# Patient Record
Sex: Male | Born: 1935 | Race: White | Hispanic: No | Smoking: Former smoker
Health system: Southern US, Community
[De-identification: ages and names within clinical notes are randomized; demographics above are authoritative.]

## PROBLEM LIST (undated history)

## (undated) DIAGNOSIS — I350 Nonrheumatic aortic (valve) stenosis: Secondary | ICD-10-CM

## (undated) DIAGNOSIS — M069 Rheumatoid arthritis, unspecified: Secondary | ICD-10-CM

## (undated) DIAGNOSIS — I2699 Other pulmonary embolism without acute cor pulmonale: Secondary | ICD-10-CM

## (undated) DIAGNOSIS — K219 Gastro-esophageal reflux disease without esophagitis: Secondary | ICD-10-CM

## (undated) DIAGNOSIS — Z8619 Personal history of other infectious and parasitic diseases: Secondary | ICD-10-CM

## (undated) DIAGNOSIS — J449 Chronic obstructive pulmonary disease, unspecified: Secondary | ICD-10-CM

## (undated) DIAGNOSIS — M545 Low back pain, unspecified: Secondary | ICD-10-CM

## (undated) DIAGNOSIS — G40909 Epilepsy, unspecified, not intractable, without status epilepticus: Secondary | ICD-10-CM

## (undated) DIAGNOSIS — Z66 Do not resuscitate: Secondary | ICD-10-CM

## (undated) DIAGNOSIS — L03116 Cellulitis of left lower limb: Secondary | ICD-10-CM

## (undated) DIAGNOSIS — E782 Mixed hyperlipidemia: Secondary | ICD-10-CM

## (undated) DIAGNOSIS — K8020 Calculus of gallbladder without cholecystitis without obstruction: Secondary | ICD-10-CM

## (undated) DIAGNOSIS — I10 Essential (primary) hypertension: Secondary | ICD-10-CM

## (undated) DIAGNOSIS — I5032 Chronic diastolic (congestive) heart failure: Secondary | ICD-10-CM

## (undated) DIAGNOSIS — E538 Deficiency of other specified B group vitamins: Secondary | ICD-10-CM

## (undated) DIAGNOSIS — K626 Ulcer of anus and rectum: Secondary | ICD-10-CM

## (undated) DIAGNOSIS — I499 Cardiac arrhythmia, unspecified: Secondary | ICD-10-CM

## (undated) DIAGNOSIS — K7460 Unspecified cirrhosis of liver: Secondary | ICD-10-CM

## (undated) DIAGNOSIS — B027 Disseminated zoster: Secondary | ICD-10-CM

## (undated) DIAGNOSIS — N183 Chronic kidney disease, stage 3 unspecified: Secondary | ICD-10-CM

## (undated) DIAGNOSIS — C8330 Diffuse large B-cell lymphoma, unspecified site: Secondary | ICD-10-CM

## (undated) DIAGNOSIS — I482 Chronic atrial fibrillation, unspecified: Secondary | ICD-10-CM

## (undated) DIAGNOSIS — G8929 Other chronic pain: Secondary | ICD-10-CM

## (undated) DIAGNOSIS — D509 Iron deficiency anemia, unspecified: Secondary | ICD-10-CM

## (undated) DIAGNOSIS — R51 Headache: Secondary | ICD-10-CM

## (undated) DIAGNOSIS — R918 Other nonspecific abnormal finding of lung field: Secondary | ICD-10-CM

## (undated) DIAGNOSIS — R760 Raised antibody titer: Secondary | ICD-10-CM

## (undated) DIAGNOSIS — M47812 Spondylosis without myelopathy or radiculopathy, cervical region: Secondary | ICD-10-CM

## (undated) DIAGNOSIS — I781 Nevus, non-neoplastic: Secondary | ICD-10-CM

## (undated) DIAGNOSIS — D649 Anemia, unspecified: Secondary | ICD-10-CM

## (undated) HISTORY — DX: Chronic obstructive pulmonary disease, unspecified: J44.9

## (undated) HISTORY — PX: PORTACATH PLACEMENT: SHX2246

## (undated) HISTORY — DX: Calculus of gallbladder without cholecystitis without obstruction: K80.20

## (undated) HISTORY — DX: Deficiency of other specified B group vitamins: E53.8

## (undated) HISTORY — DX: Chronic diastolic (congestive) heart failure: I50.32

## (undated) HISTORY — DX: Do not resuscitate: Z66

## (undated) HISTORY — PX: POSTERIOR LUMBAR VERTEBRAE EXCISION: SUR545

## (undated) HISTORY — DX: Gastro-esophageal reflux disease without esophagitis: K21.9

## (undated) HISTORY — DX: Disseminated zoster: B02.7

## (undated) HISTORY — PX: LYMPH NODE BIOPSY: SHX201

## (undated) HISTORY — DX: Other nonspecific abnormal finding of lung field: R91.8

## (undated) HISTORY — PX: MYRINGOTOMY: SUR874

## (undated) HISTORY — DX: Other pulmonary embolism without acute cor pulmonale: I26.99

## (undated) HISTORY — DX: Rheumatoid arthritis, unspecified: M06.9

## (undated) HISTORY — DX: Diffuse large B-cell lymphoma, unspecified site: C83.30

## (undated) HISTORY — DX: Iron deficiency anemia, unspecified: D50.9

## (undated) HISTORY — DX: Mixed hyperlipidemia: E78.2

## (undated) HISTORY — DX: Essential (primary) hypertension: I10

## (undated) HISTORY — DX: Spondylosis without myelopathy or radiculopathy, cervical region: M47.812

## (undated) HISTORY — DX: Unspecified cirrhosis of liver: K74.60

## (undated) HISTORY — PX: BACK SURGERY: SHX140

## (undated) HISTORY — PX: CATARACT EXTRACTION W/ INTRAOCULAR LENS  IMPLANT, BILATERAL: SHX1307

## (undated) HISTORY — DX: Raised antibody titer: R76.0

## (undated) HISTORY — DX: Headache: R51

---

## 2002-02-07 ENCOUNTER — Encounter: Payer: Self-pay | Admitting: Rheumatology

## 2002-02-07 ENCOUNTER — Encounter (HOSPITAL_COMMUNITY): Admission: RE | Admit: 2002-02-07 | Discharge: 2002-03-09 | Payer: Self-pay | Admitting: Rheumatology

## 2002-03-14 ENCOUNTER — Encounter (HOSPITAL_COMMUNITY): Admission: RE | Admit: 2002-03-14 | Discharge: 2002-04-13 | Payer: Self-pay | Admitting: Rheumatology

## 2002-04-25 ENCOUNTER — Encounter (HOSPITAL_COMMUNITY): Admission: RE | Admit: 2002-04-25 | Discharge: 2002-05-25 | Payer: Self-pay | Admitting: Rheumatology

## 2002-06-27 ENCOUNTER — Encounter (HOSPITAL_COMMUNITY): Admission: RE | Admit: 2002-06-27 | Discharge: 2002-07-27 | Payer: Self-pay | Admitting: Rheumatology

## 2002-07-26 ENCOUNTER — Encounter (HOSPITAL_COMMUNITY): Admission: RE | Admit: 2002-07-26 | Discharge: 2002-08-25 | Payer: Self-pay | Admitting: Rheumatology

## 2002-08-20 ENCOUNTER — Encounter: Payer: Self-pay | Admitting: Rheumatology

## 2002-08-29 ENCOUNTER — Encounter (HOSPITAL_COMMUNITY): Admission: RE | Admit: 2002-08-29 | Discharge: 2002-09-28 | Payer: Self-pay | Admitting: Rheumatology

## 2002-09-06 ENCOUNTER — Encounter: Payer: Self-pay | Admitting: Neurosurgery

## 2002-09-10 ENCOUNTER — Encounter: Payer: Self-pay | Admitting: Neurosurgery

## 2002-09-10 ENCOUNTER — Inpatient Hospital Stay (HOSPITAL_COMMUNITY): Admission: RE | Admit: 2002-09-10 | Discharge: 2002-09-15 | Payer: Self-pay | Admitting: Neurosurgery

## 2002-09-14 ENCOUNTER — Encounter: Payer: Self-pay | Admitting: Neurosurgery

## 2002-10-08 ENCOUNTER — Inpatient Hospital Stay (HOSPITAL_COMMUNITY): Admission: AD | Admit: 2002-10-08 | Discharge: 2002-10-17 | Payer: Self-pay | Admitting: Internal Medicine

## 2002-10-09 ENCOUNTER — Encounter: Payer: Self-pay | Admitting: Cardiology

## 2002-10-12 ENCOUNTER — Encounter: Payer: Self-pay | Admitting: Neurosurgery

## 2002-12-23 ENCOUNTER — Encounter (HOSPITAL_COMMUNITY): Admission: RE | Admit: 2002-12-23 | Discharge: 2003-01-22 | Payer: Self-pay | Admitting: Rheumatology

## 2003-01-23 ENCOUNTER — Encounter: Payer: Self-pay | Admitting: Rheumatology

## 2003-01-23 ENCOUNTER — Ambulatory Visit (HOSPITAL_COMMUNITY): Admission: RE | Admit: 2003-01-23 | Discharge: 2003-01-23 | Payer: Self-pay | Admitting: Rheumatology

## 2003-03-06 ENCOUNTER — Encounter (HOSPITAL_COMMUNITY): Admission: RE | Admit: 2003-03-06 | Discharge: 2003-04-05 | Payer: Self-pay | Admitting: Rheumatology

## 2003-05-29 ENCOUNTER — Encounter (HOSPITAL_COMMUNITY): Admission: RE | Admit: 2003-05-29 | Discharge: 2003-06-28 | Payer: Self-pay | Admitting: Rheumatology

## 2003-08-01 ENCOUNTER — Encounter (HOSPITAL_COMMUNITY): Admission: RE | Admit: 2003-08-01 | Discharge: 2003-08-31 | Payer: Self-pay | Admitting: Rheumatology

## 2003-10-03 ENCOUNTER — Ambulatory Visit (HOSPITAL_COMMUNITY): Admission: RE | Admit: 2003-10-03 | Discharge: 2003-10-03 | Payer: Self-pay | Admitting: Neurosurgery

## 2003-10-03 ENCOUNTER — Encounter: Payer: Self-pay | Admitting: Neurosurgery

## 2003-10-21 ENCOUNTER — Encounter: Admission: RE | Admit: 2003-10-21 | Discharge: 2003-10-21 | Payer: Self-pay | Admitting: Neurosurgery

## 2003-11-05 ENCOUNTER — Encounter: Admission: RE | Admit: 2003-11-05 | Discharge: 2003-11-05 | Payer: Self-pay | Admitting: Neurosurgery

## 2004-03-24 ENCOUNTER — Encounter: Admission: RE | Admit: 2004-03-24 | Discharge: 2004-03-24 | Payer: Self-pay | Admitting: Neurosurgery

## 2004-05-05 ENCOUNTER — Encounter: Admission: RE | Admit: 2004-05-05 | Discharge: 2004-05-05 | Payer: Self-pay | Admitting: Neurosurgery

## 2004-08-03 ENCOUNTER — Encounter: Admission: RE | Admit: 2004-08-03 | Discharge: 2004-08-03 | Payer: Self-pay | Admitting: Neurosurgery

## 2007-08-09 ENCOUNTER — Encounter: Admission: RE | Admit: 2007-08-09 | Discharge: 2007-08-09 | Payer: Self-pay | Admitting: Neurosurgery

## 2008-09-10 ENCOUNTER — Inpatient Hospital Stay (HOSPITAL_COMMUNITY): Admission: RE | Admit: 2008-09-10 | Discharge: 2008-09-13 | Payer: Self-pay | Admitting: Neurosurgery

## 2008-12-26 DIAGNOSIS — B027 Disseminated zoster: Secondary | ICD-10-CM

## 2008-12-26 HISTORY — DX: Disseminated zoster: B02.7

## 2009-11-18 ENCOUNTER — Encounter: Admission: RE | Admit: 2009-11-18 | Discharge: 2009-11-18 | Payer: Self-pay | Admitting: Neurosurgery

## 2010-03-04 ENCOUNTER — Encounter: Admission: RE | Admit: 2010-03-04 | Discharge: 2010-03-04 | Payer: Self-pay | Admitting: Neurosurgery

## 2010-03-29 ENCOUNTER — Encounter: Admission: RE | Admit: 2010-03-29 | Discharge: 2010-03-29 | Payer: Self-pay | Admitting: Neurosurgery

## 2010-05-11 ENCOUNTER — Encounter: Admission: RE | Admit: 2010-05-11 | Discharge: 2010-05-11 | Payer: Self-pay | Admitting: Neurosurgery

## 2010-05-25 ENCOUNTER — Inpatient Hospital Stay (HOSPITAL_COMMUNITY): Admission: RE | Admit: 2010-05-25 | Discharge: 2010-05-27 | Payer: Self-pay | Admitting: Neurosurgery

## 2010-08-23 ENCOUNTER — Emergency Department (HOSPITAL_COMMUNITY): Admission: EM | Admit: 2010-08-23 | Discharge: 2010-08-23 | Payer: Self-pay | Admitting: Emergency Medicine

## 2010-09-02 ENCOUNTER — Ambulatory Visit: Payer: Self-pay | Admitting: Cardiology

## 2010-09-02 ENCOUNTER — Inpatient Hospital Stay (HOSPITAL_COMMUNITY): Admission: EM | Admit: 2010-09-02 | Discharge: 2010-09-07 | Payer: Self-pay | Admitting: Emergency Medicine

## 2010-09-03 ENCOUNTER — Encounter (INDEPENDENT_AMBULATORY_CARE_PROVIDER_SITE_OTHER): Payer: Self-pay | Admitting: Internal Medicine

## 2010-09-03 ENCOUNTER — Encounter: Payer: Self-pay | Admitting: Cardiology

## 2010-09-07 ENCOUNTER — Encounter: Payer: Self-pay | Admitting: Cardiology

## 2010-09-09 ENCOUNTER — Ambulatory Visit: Payer: Self-pay | Admitting: Cardiology

## 2010-09-09 LAB — CONVERTED CEMR LAB: POC INR: 3.5

## 2010-09-14 ENCOUNTER — Ambulatory Visit: Payer: Self-pay | Admitting: Cardiology

## 2010-09-14 LAB — CONVERTED CEMR LAB: POC INR: 1.9

## 2010-09-15 ENCOUNTER — Encounter: Payer: Self-pay | Admitting: Cardiology

## 2010-09-16 ENCOUNTER — Ambulatory Visit: Payer: Self-pay | Admitting: Otolaryngology

## 2010-09-21 ENCOUNTER — Ambulatory Visit: Payer: Self-pay | Admitting: Cardiology

## 2010-09-21 LAB — CONVERTED CEMR LAB: POC INR: 3.8

## 2010-10-01 ENCOUNTER — Ambulatory Visit: Payer: Self-pay | Admitting: Cardiology

## 2010-10-01 LAB — CONVERTED CEMR LAB: POC INR: 3.8

## 2010-10-14 ENCOUNTER — Ambulatory Visit: Payer: Self-pay | Admitting: Otolaryngology

## 2010-10-19 ENCOUNTER — Ambulatory Visit: Payer: Self-pay | Admitting: Cardiology

## 2010-10-19 LAB — CONVERTED CEMR LAB: POC INR: 2.9

## 2010-11-02 ENCOUNTER — Ambulatory Visit: Payer: Self-pay | Admitting: Cardiology

## 2010-11-02 DIAGNOSIS — M069 Rheumatoid arthritis, unspecified: Secondary | ICD-10-CM

## 2010-11-02 DIAGNOSIS — I482 Chronic atrial fibrillation, unspecified: Secondary | ICD-10-CM

## 2010-11-02 DIAGNOSIS — I10 Essential (primary) hypertension: Secondary | ICD-10-CM

## 2010-11-02 DIAGNOSIS — I2699 Other pulmonary embolism without acute cor pulmonale: Secondary | ICD-10-CM

## 2010-11-02 DIAGNOSIS — J4489 Other specified chronic obstructive pulmonary disease: Secondary | ICD-10-CM | POA: Insufficient documentation

## 2010-11-02 DIAGNOSIS — J984 Other disorders of lung: Secondary | ICD-10-CM | POA: Insufficient documentation

## 2010-11-02 DIAGNOSIS — J449 Chronic obstructive pulmonary disease, unspecified: Secondary | ICD-10-CM

## 2010-11-09 ENCOUNTER — Ambulatory Visit: Payer: Self-pay | Admitting: Cardiology

## 2010-11-09 ENCOUNTER — Telehealth: Payer: Self-pay | Admitting: Cardiology

## 2010-11-30 ENCOUNTER — Ambulatory Visit: Payer: Self-pay | Admitting: Cardiology

## 2010-12-28 ENCOUNTER — Ambulatory Visit: Admission: RE | Admit: 2010-12-28 | Discharge: 2010-12-28 | Payer: Self-pay | Source: Home / Self Care

## 2010-12-28 LAB — CONVERTED CEMR LAB: POC INR: 2

## 2011-01-11 ENCOUNTER — Encounter: Payer: Self-pay | Admitting: *Deleted

## 2011-01-25 ENCOUNTER — Ambulatory Visit: Admit: 2011-01-25 | Payer: Self-pay

## 2011-01-25 NOTE — Letter (Signed)
Summary: Discharge Summary  Discharge Summary   Imported By: Dorise Hiss 11/02/2010 12:17:24  _____________________________________________________________________  External Attachment:    Type:   Image     Comment:   External Document

## 2011-01-25 NOTE — Assessment & Plan Note (Signed)
Summary: eph-post [place] [facility] in sept   Visit Type:  Follow-up Referring Provider:  Dr. Stacey Drain Primary Provider:  Dr. Joette Catching   History of Present Illness: 77 year old male presents for posthospital followup. He was seen in consultation at [facility] back in September with newly diagnosed atrial fibrillation. He had presented with shortness of breath and pleuritic chest pain, ultimately with findings of bilateral pulmonary emboli by chest CT angiography. A left lung nodule was also noted. Echocardiography demonstrated an LVEF of 65-70% with grade 1 diastolic dysfunction, and mild tricuspid regurgitation. Cardiac markers were normal. He was placed on Coumadin and stabilized from the perspective of pulmonary emboli. He was noted to be lupus anticoagulant positive. Atrial fibrillation spontaneously converted to sinus rhythm with rate control. He was treated with oral Cardizem. He did have transient thrombocytopenia, although this improved, even after heparin and Coumadin.  He has been followed in Coumadin clinic. Reports no major bleeding problems, occasional nosebleed, but wears oxygen chronically via nasal cannula. He has had no major palpitations since discharge. ECG shows normal sinus rhythm today. As noted previously his CHADS2 score is 2.  I expect he will be maintained on Coumadin chronically at this point in light of recurrent pulmonary emboli, significant thrombus burden, lupus anticoagulant positivity, and also associated atrial fibrillation which may well be paroxysmal.  Preventive Screening-Counseling & Management  Alcohol-Tobacco     Smoking Status: quit     Year Quit: 1999      Drug Use:  no.    Current Medications (verified): 1)  Cardizem Cd 120 Mg Xr24h-Cap (Diltiazem Hcl Coated Beads) .... Take 1 Tablet By Mouth Once A Day 2)  Klor-Con M20 20 Meq Cr-Tabs (Potassium Chloride Crys Cr) .... Take 1 Tablet By Mouth Two Times A Day 3)  Omeprazole 20 Mg Cpdr  (Omeprazole) .... Take 1 Tablet By Mouth Two Times A Day 4)  Clonidine Hcl 0.1 Mg Tabs (Clonidine Hcl) .... Take 1 Tablet By Mouth Two Times A Day 5)  Folic Acid 1 Mg Tabs (Folic Acid) .... Take 1 Tablet By Mouth Once A Day 6)  Gabapentin 300 Mg Caps (Gabapentin) .... Take 1 Tablet By Mouth Three Times A Day 7)  Oxycodone-Acetaminophen 5-325 Mg Tabs (Oxycodone-Acetaminophen) .... Take 1 Tablet By Mouth Three Times A Day 8)  Proair Hfa 108 (90 Base) Mcg/act Aers (Albuterol Sulfate) .... 2 Puffs Four Times A Day As Needed 9)  Albuterol Sulfate (2.5 Mg/25ml) 0.083% Nebu (Albuterol Sulfate) .... One Neb Tx Four Times A Day 10)  Fish Oil 1000 Mg Caps (Omega-3 Fatty Acids) .... Take 1 Tablet By Mouth Two Times A Day 11)  Calcium Carbonate 600 Mg Tabs (Calcium Carbonate) .... Take 1 Tablet By Mouth Two Times A Day 12)  Warfarin Sodium 2 Mg Tabs (Warfarin Sodium) .... Use As Directed 13)  Oxygen 2 L/[place] .... Use As Directed Continuous 14)  Prednisone 10 Mg Tabs (Prednisone) .... Use As Directed 15)  Furosemide 40 Mg Tabs (Furosemide) .... Take 1 Tablet By Mouth Once A Day 16)  Zolpidem Tartrate 10 Mg Tabs (Zolpidem Tartrate) .... Take 1 Tablet By Mouth Once A Day 17)  Hydrocodone-Acetaminophen 5-500 Mg Tabs (Hydrocodone-Acetaminophen) .... Take 1 Tablet By Mouth Two Times A Day As Needed  Allergies (verified): 1)  ! Novocain  Comments:  Nurse/Medical Assistant: The patient's medication bottles and allergies were reviewed with the patient and were updated in the Medication and Allergy Lists.  Past History:  Family History: Last updated:  11/02/2010 Noncontributory for premature cardiovascular disease  Social History: Last updated: 11/02/2010 Married  Tobacco Use - Former (quit 1999) Alcohol Use - no (former, quit 1967) Drug Use - no  Past Medical History: Rheumatoid arthritis COPD Disseminated Herpes zoster GERD Asthma Hypertension Pulmonary embolus 2003 and 2011 Pulmonary nodules -  recent chest CT 9/11 Hyperlipidemia Left-sided otitis externa Atrial Fibrillation - CHADS2 score 2  Past Surgical History: Cataract Extraction Lumbar Fusion Bilateral L2 laminectomy  Family History: Noncontributory for premature cardiovascular disease  Social History: Married  Tobacco Use - Former (quit 1999) Alcohol Use - no (former, quit 1967) Drug Use - no Smoking Status:  quit Drug Use:  no  Review of Systems       The patient complains of dyspnea on exertion.  The patient denies anorexia, fever, chest pain, syncope, peripheral edema, hemoptysis, melena, and hematochezia.         Otherwise reviewed and negative except as outlined.  Vital Signs:  Patient profile:   77 year old male Height:      72 inches Weight:      207 pounds BMI:     28.18 O2 Sat:      95 % on 2 L/min [place] Pulse rate:   73 / minute BP sitting:   120 / 71  (left arm) Cuff size:   large  Vitals Entered By: Carlye Grippe (November 02, 2010 2:45 PM)  Nutrition Counseling: Patient's BMI is greater than 25 and therefore counseled on weight management options.  O2 Flow:  2 L/min [place]  Physical Exam  Additional Exam:  Overweight, bearded male, no acute distress, wearing oxygen via nasal cannula. HEENT: Conjunctiva and lids normal, oropharynx with poor dentition. Neck: Supple, no elevated JVP or carotid bruits. Lungs: Diminished, coarse breath sounds throughout, slight end expiratory wheeze. Cardiac: Regular rate and rhythm, no S3. Abdomen: Soft, nontender, bowel sounds present. Extremities: No pitting edema. Skin: Diffuse epidermal scarring status post disseminated zoster. Musculoskeletal: No kyphosis. Neuropsychiatric: Alert and oriented x3, affect appropriate.   Echocardiogram  Procedure date:  09/03/2010  Findings:       Study Conclusions    - Procedure narrative: Transthoracic echocardiography. Image quality     was poor.   - Left ventricle: The cavity size was mildly reduced. Wall  thickness     was increased in a pattern of mild LVH. Systolic function was     vigorous. The estimated ejection fraction was in the range of 65%     to 70%. Doppler parameters are consistent with abnormal left     ventricular relaxation (grade 1 diastolic dysfunction).   - Aortic valve: Poorly visualized.   - Mitral valve: Poorly visualized. Calcified annulus.   - Left atrium: The atrium was mildly dilated.   - Tricuspid valve: Mild regurgitation.   - Pericardium, extracardiac: A trivial pericardial effusion was     identified.  CT Scan  Procedure date:  09/03/2010  Findings:       IMPRESSION:   Bilateral pulmonary emboli, including large saddle embolus at the   right pulmonary bifurcation as well as a large left lower lobe   pulmonary embolus.   Bibasilar atelectasis.   2 left lung nodules, 10 and 6 mm in greatest sizes, the largest of   which corresponds in size and position with a nodule described on a   chest CT of 10/08/2002, though those images are no longer available   for comparison.   Recommend follow-up CT in 3-4 months  to reassess.  EKG  Procedure date:  11/02/2010  Findings:      Sinus rhythm at 77 beats per minute with leftward axis.  Impression & Recommendations:  Problem # 1:  ATRIAL FIBRILLATION (ICD-427.31)  Maintains sinus rhythm. It could be that he has had paroxysmal atrial fibrillation that was never formally diagnosed, although perhaps also potentially related to his pulmonary emboli. CHADS2 score is 2. Plan to continue Coumadin and Cardizem CD at this point. Followup in 4 months. Continue regular followup in the Coumadin clinic.  His updated medication list for this problem includes:    Warfarin Sodium 2 Mg Tabs (Warfarin sodium) ..... Use as directed  Orders: EKG w/ Interpretation (93000)  Problem # 2:  PULMONARY EMBOLISM (ICD-415.19)  Bilateral pulmonary emboli diagnosed in September, with prior episode of pulmonary emboli in 2003. Patient  noted to have lupus anticoagulant positivity. I would anticipate he will stay on Coumadin long term.  His updated medication list for this problem includes:    Warfarin Sodium 2 Mg Tabs (Warfarin sodium) ..... Use as directed  Problem # 3:  PULMONARY NODULE (ICD-518.89)  Noted on chest CT angiogram from September. Reportedly not a new finding. Study report indicates consideration for followup imaging in 3-4 months. Will defer to patient's primary care physician, Dr. Lysbeth Galas. Referral for formal pulmonary consultation and followup could be considered, particularly in light of his known COPD that is oxygen dependent, and also recent diagnosis of pulmonary emboli.  Problem # 4:  HYPERTENSION (ICD-401.9)  Blood pressure well controlled today.  His updated medication list for this problem includes:    Cardizem Cd 120 Mg Xr24h-cap (Diltiazem hcl coated beads) .Marland Kitchen... Take 1 tablet by mouth once a day    Clonidine Hcl 0.1 Mg Tabs (Clonidine hcl) .Marland Kitchen... Take 1 tablet by mouth two times a day    Furosemide 40 Mg Tabs (Furosemide) .Marland Kitchen... Take 1 tablet by mouth once a day  Patient Instructions: 1)  Your physician wants you to follow-up in: 4 months. You will receive a reminder letter in the mail one-two months in advance. If you don't receive a letter, please call our office to schedule the follow-up appointment. 2)  Your physician recommends that you continue on your current medications as directed. Please refer to the Current Medication list given to you today. Prescriptions: CARDIZEM CD 120 MG XR24H-CAP (DILTIAZEM HCL COATED BEADS) Take 1 tablet by mouth once a day  #30 x 6   Entered by:   Cyril Loosen, RN, BSN   Authorized by:   Loreli Slot, MD, [facility]   Signed by:   Cyril Loosen, RN, BSN on 11/02/2010   Method used:   [facility] to        [facility]  [address] [address] [id number]retail[facility]       [address]       [place], [place]  [address]       Ph: [phone] or  [phone]       Fax: [phone]   RxID:   [id number]

## 2011-01-25 NOTE — Medication Information (Signed)
Summary: ccr-lr  Anticoagulant Therapy  Managed by: Vashti Hey RN Supervising MD: Diona Browner MD, Remi Deter Indication 1: Pulmonary Embolism (Bilateral) Lab Used: [facility] [place] Site: [place] INR POC 2.9  Dietary changes: no    Health status changes: no    Bleeding/hemorrhagic complications: no    Recent/future hospitalizations: no    Any changes in medication regimen? no    Recent/future dental: no  Any missed doses?: no       Is patient compliant with meds? yes       Anticoagulation Management History:      The patient is taking warfarin and comes in today for a routine follow up visit.  Positive risk factors for bleeding include an age of 75 years or older.  The bleeding index is 'intermediate risk'.  Positive CHADS2 values include Age > 55 years old.  Anticoagulation responsible Jakai Onofre: Diona Browner MD, Remi Deter.  INR POC: 2.9.  Cuvette Lot#: [id number].    Anticoagulation Management Assessment/Plan:      The target INR is 2.0-3.0.  The next INR is due 11/09/2010.  Anticoagulation instructions were given to patient.  Results were reviewed/authorized by Vashti Hey RN.  He was notified by Vashti Hey RN.         Prior Anticoagulation Instructions: INR 3.8 Hold coumadin tonight then decrease dose to 4mg  once daily except 6mg  on Mondays and Thursdays  Current Anticoagulation Instructions: INR 2.9 Continue coumadin 4mg  once daily except 6mg  on Mondays and Thursdays

## 2011-01-25 NOTE — Medication Information (Signed)
Summary: ccr-lr  Anticoagulant Therapy  Managed by: Vashti Hey RN PCP: Dr. Joette Catching Supervising MD: Antoine Poche MD, Fayrene Fearing Indication 1: Pulmonary Embolism (Bilateral) Lab Used: [facility] [place] Site: [place] INR POC 3.0  Dietary changes: no    Health status changes: no    Bleeding/hemorrhagic complications: no    Recent/future hospitalizations: no    Any changes in medication regimen? no    Recent/future dental: no  Any missed doses?: no       Is patient compliant with meds? yes       Allergies: 1)  ! Novocain  Anticoagulation Management History:      The patient is taking warfarin and comes in today for a routine follow up visit.  Positive risk factors for bleeding include an age of 75 years or older.  The bleeding index is 'intermediate risk'.  Positive CHADS2 values include History of HTN and Age > 34 years old.  Anticoagulation responsible Hamna Asa: Antoine Poche MD, Fayrene Fearing.  INR POC: 3.0.  Cuvette Lot#: [id number].    Anticoagulation Management Assessment/Plan:      The patient's current anticoagulation dose is Warfarin sodium 2 mg tabs: use as directed.  The target INR is 2.0-3.0.  The next INR is due 12/28/2010.  Anticoagulation instructions were given to patient.  Results were reviewed/authorized by Vashti Hey RN.  He was notified by Vashti Hey RN.         Prior Anticoagulation Instructions: INR 3.6 Take coumadin 1 tablet tonight then decrease dose to 2 tablets once daily except 3 tablets on Fridays  Current Anticoagulation Instructions: INR 3.0 Continue coumadin 4mg  once daily except 6mg  on Fridays Prescriptions: WARFARIN SODIUM 2 MG TABS (WARFARIN SODIUM) use as directed  #90 x 3   Entered by:   Vashti Hey RN   Authorized by:   Loreli Slot, MD, [facility]   Signed by:   Vashti Hey RN on 11/30/2010   Method used:   [facility]  [address] [id number]       [address]       [place], [place]   [address]       Ph: [phone] or [phone]       Fax: [phone]   RxID:   [id number]

## 2011-01-25 NOTE — Medication Information (Signed)
Summary: ccr-lr  Anticoagulant Therapy  Managed by: Vashti Hey RN Supervising MD: Andee Lineman MD, Michelle Piper Indication 1: Pulmonary Embolism (Bilateral) Lab Used: [facility] [place] Site: [place] INR POC 3.8  Dietary changes: no    Health status changes: no    Bleeding/hemorrhagic complications: no    Recent/future hospitalizations: no    Any changes in medication regimen? no    Recent/future dental: no  Any missed doses?: no       Is patient compliant with meds? yes       Anticoagulation Management History:      The patient is taking warfarin and comes in today for a routine follow up visit.  Positive risk factors for bleeding include an age of 76 years or older.  The bleeding index is 'intermediate risk'.  Positive CHADS2 values include Age > 18 years old.  Anticoagulation responsible provider: Andee Lineman MD, Michelle Piper.  INR POC: 3.8.  Cuvette Lot#: [id number].    Anticoagulation Management Assessment/Plan:      The target INR is 2.0-3.0.  The next INR is due 10/01/2010.  Anticoagulation instructions were given to patient.  Results were reviewed/authorized by Vashti Hey RN.  He was notified by Vashti Hey RN.         Prior Anticoagulation Instructions: INR 1.9 Increase coumadin to 6mg  once daily except 4mg  on Mondays  Current Anticoagulation Instructions: INR 3.8 Hold coumadin tonight then decrease dose to 6mg  once daily except 4mg  on Tuesdays and Fridays

## 2011-01-25 NOTE — Medication Information (Signed)
Summary: ccr-lr  Anticoagulant Therapy  Managed by: Vashti Hey RN Supervising MD: Andee Lineman MD, Michelle Piper Indication 1: Pulmonary Embolism (Bilateral) Lab Used: [facility] [place] Site: [place] INR POC 3.8  Dietary changes: no    Health status changes: no    Bleeding/hemorrhagic complications: no    Recent/future hospitalizations: no    Any changes in medication regimen? no    Recent/future dental: no  Any missed doses?: no       Is patient compliant with meds? yes       Anticoagulation Management History:      The patient is taking warfarin and comes in today for a routine follow up visit.  Positive risk factors for bleeding include an age of 75 years or older.  The bleeding index is 'intermediate risk'.  Positive CHADS2 values include Age > 13 years old.  Anticoagulation responsible provider: Andee Lineman MD, Michelle Piper.  INR POC: 3.8.  Cuvette Lot#: [id number].    Anticoagulation Management Assessment/Plan:      The target INR is 2.0-3.0.  The next INR is due 10/19/2010.  Anticoagulation instructions were given to patient.  Results were reviewed/authorized by Vashti Hey RN.  He was notified by Vashti Hey RN.         Prior Anticoagulation Instructions: INR 3.8 Hold coumadin tonight then decrease dose to 6mg  once daily except 4mg  on Tuesdays and Fridays  Current Anticoagulation Instructions: INR 3.8 Hold coumadin tonight then decrease dose to 4mg  once daily except 6mg  on Mondays and Thursdays

## 2011-01-25 NOTE — Consult Note (Signed)
Summary: Consultation Report/ ATRIAL FIBRILLATION  Consultation Report/ ATRIAL FIBRILLATION   Imported By: Dorise Hiss 11/02/2010 12:16:08  _____________________________________________________________________  External Attachment:    Type:   Image     Comment:   External Document

## 2011-01-25 NOTE — Medication Information (Signed)
Summary: POST HOSP PROTIME PER DR.FISHER/TG  Anticoagulant Therapy  Managed by: Vashti Hey RN Supervising MD: Dietrich Pates MD, Molly Maduro Indication 1: Pulmonary Embolism (Bilateral) Lab Used: [facility] [place] Site: [place] INR POC 3.5  Dietary changes: no    Health status changes: no    Bleeding/hemorrhagic complications: no    Recent/future hospitalizations: yes       Details: In [facility] 09/02/10 - 09/07/10  bilateral PE  Any changes in medication regimen? yes       Details: discharged on coumadin 6mg  qd   Has 2mg  tablet  Recent/future dental: no  Any missed doses?: no       Is patient compliant with meds? yes      Comments: Coumadin teaching performed with pt and family menmber.  Anticoagulation Management History:      The patient comes in today for his initial visit for anticoagulation therapy.  Positive risk factors for bleeding include an age of 75 years or older.  The bleeding index is 'intermediate risk'.  Positive CHADS2 values include Age > 21 years old.  Anticoagulation responsible Andres Vest: Dietrich Pates MD, Molly Maduro.  INR POC: 3.5.  Cuvette Lot#: [id number].    Anticoagulation Management Assessment/Plan:      The target INR is 2.0-3.0.  The next INR is due 09/14/2010.  Anticoagulation instructions were given to patient.  Results were reviewed/authorized by Vashti Hey RN.  He was notified by Vashti Hey RN.        Coagulation management information includes: D/C 9/13 on 6mg  once daily .  Current Anticoagulation Instructions: INR 3.5 Hold coumadin tonight then decrease dose to 3 tablets once daily except 2 tablets on Saturdays and Mondays

## 2011-01-25 NOTE — Medication Information (Signed)
Summary: ccr-lr  Anticoagulant Therapy  Managed by: Vashti Hey RN Supervising MD: Antoine Poche MD, Fayrene Fearing Indication 1: Pulmonary Embolism (Bilateral) Lab Used: [facility] [place] Site: [place] INR POC 1.9  Dietary changes: no    Health status changes: no    Bleeding/hemorrhagic complications: no    Recent/future hospitalizations: no    Any changes in medication regimen? no    Recent/future dental: no  Any missed doses?: no       Is patient compliant with meds? yes       Anticoagulation Management History:      The patient is taking warfarin and comes in today for a routine follow up visit.  Positive risk factors for bleeding include an age of 75 years or older.  The bleeding index is 'intermediate risk'.  Positive CHADS2 values include Age > 75 years old.  Anticoagulation responsible provider: Antoine Poche MD, Fayrene Fearing.  INR POC: 1.9.  Cuvette Lot#: [id number].    Anticoagulation Management Assessment/Plan:      The target INR is 2.0-3.0.  The next INR is due 09/21/2010.  Anticoagulation instructions were given to patient.  Results were reviewed/authorized by Vashti Hey RN.  He was notified by Vashti Hey RN.         Prior Anticoagulation Instructions: INR 3.5 Hold coumadin tonight then decrease dose to 3 tablets once daily except 2 tablets on Saturdays and Mondays   Current Anticoagulation Instructions: INR 1.9 Increase coumadin to 6mg  once daily except 4mg  on Mondays

## 2011-01-25 NOTE — Medication Information (Signed)
Summary: ccr-lr  Anticoagulant Therapy  Managed by: Vashti Hey RN PCP: Dr. Joette Catching Supervising MD: Andee Lineman MD, Michelle Piper Indication 1: Pulmonary Embolism (Bilateral) Lab Used: [facility] [place] Site: [place] INR POC 3.6  Dietary changes: no    Health status changes: no    Bleeding/hemorrhagic complications: no    Recent/future hospitalizations: no    Any changes in medication regimen? no    Recent/future dental: no  Any missed doses?: no       Is patient compliant with meds? yes       Allergies: 1)  ! Novocain  Anticoagulation Management History:      The patient is taking warfarin and comes in today for a routine follow up visit.  Positive risk factors for bleeding include an age of 75 years or older.  The bleeding index is 'intermediate risk'.  Positive CHADS2 values include History of HTN and Age > 33 years old.  Anticoagulation responsible provider: Andee Lineman MD, Michelle Piper.  INR POC: 3.6.  Cuvette Lot#: [id number].    Anticoagulation Management Assessment/Plan:      The patient's current anticoagulation dose is Warfarin sodium 2 mg tabs: use as directed.  The target INR is 2.0-3.0.  The next INR is due 11/09/2010.  Anticoagulation instructions were given to patient.  Results were reviewed/authorized by Vashti Hey RN.  He was notified by Vashti Hey RN.         Prior Anticoagulation Instructions: INR 2.9 Continue coumadin 4mg  once daily except 6mg  on Mondays and Thursdays  Current Anticoagulation Instructions: INR 3.6 Take coumadin 1 tablet tonight then decrease dose to 2 tablets once daily except 3 tablets on Fridays

## 2011-01-27 NOTE — Medication Information (Signed)
Summary: Coumadin Clinic  Anticoagulant Therapy  Managed by: Inactive PCP: Dr. Joette Catching Supervising MD: Diona Browner MD, Remi Deter Indication 1: Pulmonary Embolism (Bilateral) Lab Used: [facility] [place] Site: [place]          Comments: Pt is having coumadin managed at [facility] in [place] now  Allergies: 1)  ! Novocain  Anticoagulation Management History:      Positive risk factors for bleeding include an age of 75 years or older.  The bleeding index is 'intermediate risk'.  Positive CHADS2 values include History of HTN and Age > 75 years old.  Anticoagulation responsible provider: Diona Browner MD, Remi Deter.    Anticoagulation Management Assessment/Plan:      The patient's current anticoagulation dose is Warfarin sodium 2 mg tabs: use as directed.  The target INR is 2.0-3.0.  The next INR is due 01/25/2011.  Anticoagulation instructions were given to patient.  Results were reviewed/authorized by Inactive.         Prior Anticoagulation Instructions: INR 2.0 Continue coumadin 4mg  once daily except 6mg  on Fridays

## 2011-01-27 NOTE — Medication Information (Signed)
Summary: ccr-lr  Anticoagulant Therapy  Managed by: Vashti Hey RN PCP: Dr. Joette Catching Supervising MD: Diona Browner MD, Remi Deter Indication 1: Pulmonary Embolism (Bilateral) Lab Used: [facility] [place] Site: [place] INR POC 2.0  Dietary changes: no    Health status changes: no    Bleeding/hemorrhagic complications: no    Recent/future hospitalizations: no    Any changes in medication regimen? no    Recent/future dental: no  Any missed doses?: no       Is patient compliant with meds? yes       Allergies: 1)  ! Novocain  Anticoagulation Management History:      The patient is taking warfarin and comes in today for a routine follow up visit.  Positive risk factors for bleeding include an age of 44 years or older.  The bleeding index is 'intermediate risk'.  Positive CHADS2 values include History of HTN and Age > 65 years old.  Anticoagulation responsible provider: Diona Browner MD, Remi Deter.  INR POC: 2.0.  Cuvette Lot#: [id number].    Anticoagulation Management Assessment/Plan:      The patient's current anticoagulation dose is Warfarin sodium 2 mg tabs: use as directed.  The target INR is 2.0-3.0.  The next INR is due 01/25/2011.  Anticoagulation instructions were given to patient.  Results were reviewed/authorized by Vashti Hey RN.  He was notified by Vashti Hey RN.         Prior Anticoagulation Instructions: INR 3.0 Continue coumadin 4mg  once daily except 6mg  on Fridays  Current Anticoagulation Instructions: INR 2.0 Continue coumadin 4mg  once daily except 6mg  on Fridays

## 2011-01-27 NOTE — Progress Notes (Signed)
Summary: needs to come off coumadin  Phone Note Outgoing Call   Call placed by: Vashti Hey RN,  November 09, 2010 3:20 PM Summary of Call: Dr Jeral Fruit wants to schedule pt for lumbar/cervical myelogram.  Is it ok for pt to come off couamdin 5 days before? Initial call taken by: Vashti Hey RN,  November 09, 2010 3:22 PM  Follow-up for Phone Call        He was just diagnosed with a recurrent pulmonary embolus with lupus anticoagulant positivity in September and has atrial fibrillation as well.  He really should not come off Coumadin this soon after diagnosis.  I expect he will require bridging with either Lovenox or Heparin for procedures in the future. Follow-up by: Loreli Slot, MD, [facility],  November 09, 2010 3:55 PM

## 2011-02-28 ENCOUNTER — Ambulatory Visit (HOSPITAL_COMMUNITY)
Admission: RE | Admit: 2011-02-28 | Discharge: 2011-02-28 | Disposition: A | Discharge status: Home / Self Care | Payer: Medicare PPO | Source: Ambulatory Visit | Attending: Ophthalmology | Admitting: Ophthalmology

## 2011-02-28 DIAGNOSIS — I10 Essential (primary) hypertension: Secondary | ICD-10-CM | POA: Insufficient documentation

## 2011-02-28 DIAGNOSIS — H26499 Other secondary cataract, unspecified eye: Secondary | ICD-10-CM | POA: Insufficient documentation

## 2011-03-10 LAB — PROTIME-INR
Prothrombin Time: 14.8 seconds (ref 11.6–15.2)
Prothrombin Time: 16.2 seconds — ABNORMAL HIGH (ref 11.6–15.2)
Prothrombin Time: 22.2 seconds — ABNORMAL HIGH (ref 11.6–15.2)
Prothrombin Time: 23.6 seconds — ABNORMAL HIGH (ref 11.6–15.2)
Prothrombin Time: 24.3 seconds — ABNORMAL HIGH (ref 11.6–15.2)
Prothrombin Time: 28.1 seconds — ABNORMAL HIGH (ref 11.6–15.2)

## 2011-03-10 LAB — BASIC METABOLIC PANEL
BUN: 16 mg/dL (ref 6–23)
BUN: 8 mg/dL (ref 6–23)
Calcium: 8.5 mg/dL (ref 8.4–10.5)
Calcium: 8.9 mg/dL (ref 8.4–10.5)
Chloride: 104 mEq/L (ref 96–112)
Creatinine, Ser: 0.93 mg/dL (ref 0.4–1.5)
Creatinine, Ser: 1.12 mg/dL (ref 0.4–1.5)
GFR calc Af Amer: >60 mL/min (ref >60)
GFR calc non Af Amer: 59 mL/min — ABNORMAL LOW (ref >60)
GFR calc non Af Amer: >60 mL/min (ref >60)
GFR calc non Af Amer: >60 mL/min (ref >60)
Glucose, Bld: 96 mg/dL (ref 70–99)
Potassium: 4.6 mEq/L (ref 3.5–5.1)
Sodium: 135 mEq/L (ref 135–145)

## 2011-03-10 LAB — DIFFERENTIAL
Basophils Absolute: 0 10*3/uL (ref 0.0–0.1)
Basophils Absolute: 0 10*3/uL (ref 0.0–0.1)
Basophils Absolute: 0 10*3/uL (ref 0.0–0.1)
Basophils Absolute: 0 10*3/uL (ref 0.0–0.1)
Basophils Absolute: 0 10*3/uL (ref 0.0–0.1)
Basophils Relative: 0 % (ref 0–1)
Basophils Relative: 0 % (ref 0–1)
Basophils Relative: 0 % (ref 0–1)
Basophils Relative: 0 % (ref 0–1)
Eosinophils Absolute: 0.2 10*3/uL (ref 0.0–0.7)
Eosinophils Absolute: 0.4 10*3/uL (ref 0.0–0.7)
Eosinophils Absolute: 0.5 10*3/uL (ref 0.0–0.7)
Eosinophils Absolute: 0.3 10*3/uL (ref 0.0–0.7)
Eosinophils Relative: 4 % (ref 0–5)
Eosinophils Relative: 7 % — ABNORMAL HIGH (ref 0–5)
Lymphocytes Relative: 11 % — ABNORMAL LOW (ref 12–46)
Lymphocytes Relative: 15 % (ref 12–46)
Lymphocytes Relative: 17 % (ref 12–46)
Lymphs Abs: 1.3 10*3/uL (ref 0.7–4.0)
Lymphs Abs: 1.5 10*3/uL (ref 0.7–4.0)
Monocytes Absolute: 0.9 10*3/uL (ref 0.1–1.0)
Monocytes Absolute: 1 10*3/uL (ref 0.1–1.0)
Monocytes Absolute: 1.1 10*3/uL — ABNORMAL HIGH (ref 0.1–1.0)
Monocytes Relative: 11 % (ref 3–12)
Monocytes Relative: 11 % (ref 3–12)
Monocytes Relative: 14 % — ABNORMAL HIGH (ref 3–12)
Neutro Abs: 3.9 10*3/uL (ref 1.7–7.7)
Neutro Abs: 4.7 10*3/uL (ref 1.7–7.7)
Neutro Abs: 6.4 10*3/uL (ref 1.7–7.7)
Neutro Abs: 6.9 10*3/uL (ref 1.7–7.7)
Neutro Abs: 7.8 10*3/uL — ABNORMAL HIGH (ref 1.7–7.7)
Neutro Abs: 3.2 10*3/uL (ref 1.7–7.7)
Neutrophils Relative %: 62 % (ref 43–77)
Neutrophils Relative %: 65 % (ref 43–77)
Neutrophils Relative %: 69 % (ref 43–77)
Neutrophils Relative %: 63 % (ref 43–77)

## 2011-03-10 LAB — CBC
HCT: 47.6 % (ref 39.0–52.0)
Hemoglobin: 16 g/dL (ref 13.0–17.0)
MCH: 33.1 pg (ref 26.0–34.0)
MCH: 32.7 pg (ref 26.0–34.0)
MCHC: 33.6 g/dL (ref 30.0–36.0)
MCHC: 33.9 g/dL (ref 30.0–36.0)
MCHC: 34 g/dL (ref 30.0–36.0)
MCHC: 33.7 g/dL (ref 30.0–36.0)
MCV: 98.4 fL (ref 78.0–100.0)
MCV: 98.8 fL (ref 78.0–100.0)
Platelets: 103 10*3/uL — ABNORMAL LOW (ref 150–400)
Platelets: 120 10*3/uL — ABNORMAL LOW (ref 150–400)
Platelets: 141 10*3/uL — ABNORMAL LOW (ref 150–400)
Platelets: 167 10*3/uL (ref 150–400)
Platelets: 206 10*3/uL (ref 150–400)
Platelets: 97 10*3/uL — ABNORMAL LOW (ref 150–400)
RBC: 3.56 MIL/uL — ABNORMAL LOW (ref 4.22–5.81)
RBC: 3.77 MIL/uL — ABNORMAL LOW (ref 4.22–5.81)
RBC: 4.14 MIL/uL — ABNORMAL LOW (ref 4.22–5.81)
RDW: 15.1 % (ref 11.5–15.5)
RDW: 15.1 % (ref 11.5–15.5)
RDW: 15.4 % (ref 11.5–15.5)
RDW: 15.5 % (ref 11.5–15.5)
RDW: 15.8 % — ABNORMAL HIGH (ref 11.5–15.5)
WBC: 10 10*3/uL (ref 4.0–10.5)
WBC: 10.4 10*3/uL (ref 4.0–10.5)
WBC: 7.7 10*3/uL (ref 4.0–10.5)
WBC: 9 10*3/uL (ref 4.0–10.5)

## 2011-03-10 LAB — PROTHROMBIN GENE MUTATION

## 2011-03-10 LAB — LIPID PANEL
HDL: 26 mg/dL — ABNORMAL LOW (ref >39)
Total CHOL/HDL Ratio: 5.5 RATIO
Triglycerides: 148 mg/dL (ref <150)
VLDL: 30 mg/dL (ref 0–40)

## 2011-03-10 LAB — COMPREHENSIVE METABOLIC PANEL
ALT: 38 U/L (ref 0–53)
AST: 29 U/L (ref 0–37)
Albumin: 3.4 g/dL — ABNORMAL LOW (ref 3.5–5.2)
Albumin: 3.7 g/dL (ref 3.5–5.2)
Alkaline Phosphatase: 35 U/L — ABNORMAL LOW (ref 39–117)
Calcium: 8.9 mg/dL (ref 8.4–10.5)
Calcium: 8.9 mg/dL (ref 8.4–10.5)
Creatinine, Ser: 1.03 mg/dL (ref 0.4–1.5)
GFR calc Af Amer: >60 mL/min (ref >60)
GFR calc non Af Amer: >60 mL/min (ref >60)
Potassium: 4.1 mEq/L (ref 3.5–5.1)
Sodium: 138 mEq/L (ref 135–145)
Total Protein: 7.1 g/dL (ref 6.0–8.3)
Total Protein: 7.1 g/dL (ref 6.0–8.3)

## 2011-03-10 LAB — MAGNESIUM: Magnesium: 2 mg/dL (ref 1.5–2.5)

## 2011-03-10 LAB — CARDIAC PANEL(CRET KIN+CKTOT+MB+TROPI)
CK, MB: 1.3 ng/mL (ref 0.3–4.0)
CK, MB: 1.6 ng/mL (ref 0.3–4.0)
Relative Index: INVALID (ref 0.0–2.5)
Relative Index: INVALID (ref 0.0–2.5)
Relative Index: INVALID (ref 0.0–2.5)
Total CK: 45 U/L (ref 7–232)
Total CK: 47 U/L (ref 7–232)
Troponin I: 0.02 ng/mL (ref 0.00–0.06)
Troponin I: 0.02 ng/mL (ref 0.00–0.06)

## 2011-03-10 LAB — CULTURE, BLOOD (ROUTINE X 2)
Culture: NO GROWTH
Report Status: 9132011

## 2011-03-10 LAB — HEPARIN LEVEL (UNFRACTIONATED)
Heparin Unfractionated: 0.15 IU/mL — ABNORMAL LOW (ref 0.30–0.70)
Heparin Unfractionated: 0.39 IU/mL (ref 0.30–0.70)
Heparin Unfractionated: 0.58 IU/mL (ref 0.30–0.70)

## 2011-03-10 LAB — PROTEIN S ACTIVITY: Protein S Activity: 17 % — ABNORMAL LOW (ref 69–129)

## 2011-03-10 LAB — PROTEIN C, TOTAL: Protein C, Total: 41 % — ABNORMAL LOW (ref 70–140)

## 2011-03-10 LAB — POCT CARDIAC MARKERS
Myoglobin, poc: 146 ng/mL (ref 12–200)
Troponin i, poc: <0.05 ng/mL (ref 0.00–0.09)

## 2011-03-10 LAB — LUPUS ANTICOAGULANT PANEL
Lupus Anticoagulant: DETECTED — AB
PTT Lupus Anticoagulant: 195.1 secs — ABNORMAL HIGH (ref 30.0–45.6)
PTTLA 4:1 Mix: 128.3 secs — ABNORMAL HIGH (ref 30.0–45.6)

## 2011-03-10 LAB — NASAL CULTURE (N/P)

## 2011-03-10 LAB — GLUCOSE, CAPILLARY: Glucose-Capillary: 142 mg/dL — ABNORMAL HIGH (ref 70–99)

## 2011-03-10 LAB — FACTOR 5 LEIDEN

## 2011-03-10 LAB — CARDIOLIPIN ANTIBODIES, IGG, IGM, IGA
Anticardiolipin IgA: 6 APL U/mL — ABNORMAL LOW (ref <22)
Anticardiolipin IgG: 9 GPL U/mL — ABNORMAL LOW (ref <23)

## 2011-03-10 LAB — BETA-2-GLYCOPROTEIN I ABS, IGG/M/A: Beta-2 Glyco I IgG: 3 G Units (ref <20)

## 2011-03-10 LAB — HOMOCYSTEINE: Homocysteine: 9.8 umol/L (ref 4.0–15.4)

## 2011-03-10 LAB — PROTEIN C ACTIVITY: Protein C Activity: <15 % (ref 75–133)

## 2011-03-10 LAB — SEDIMENTATION RATE: Sed Rate: 10 mm/hr (ref 0–16)

## 2011-03-14 LAB — CBC
Hemoglobin: 15 g/dL (ref 13.0–17.0)
RDW: 14.7 % (ref 11.5–15.5)
WBC: 7.6 10*3/uL (ref 4.0–10.5)

## 2011-03-14 LAB — BASIC METABOLIC PANEL
Calcium: 9.9 mg/dL (ref 8.4–10.5)
GFR calc Af Amer: >60 mL/min (ref >60)
GFR calc non Af Amer: >60 mL/min (ref >60)
Glucose, Bld: 109 mg/dL — ABNORMAL HIGH (ref 70–99)
Potassium: 4.8 mEq/L (ref 3.5–5.1)
Sodium: 139 mEq/L (ref 135–145)

## 2011-03-14 LAB — SURGICAL PCR SCREEN: MRSA, PCR: NEGATIVE

## 2011-03-22 ENCOUNTER — Encounter: Payer: Self-pay | Admitting: *Deleted

## 2011-03-24 ENCOUNTER — Encounter: Payer: Self-pay | Admitting: Cardiology

## 2011-03-25 ENCOUNTER — Ambulatory Visit (INDEPENDENT_AMBULATORY_CARE_PROVIDER_SITE_OTHER): Payer: Medicare PPO | Admitting: Cardiology

## 2011-03-25 ENCOUNTER — Encounter: Payer: Self-pay | Admitting: Cardiology

## 2011-03-25 ENCOUNTER — Other Ambulatory Visit: Payer: Self-pay | Admitting: Cardiology

## 2011-03-25 VITALS — BP 127/82 | HR 95 | Ht 73.0 in | Wt 216.0 lb

## 2011-03-25 DIAGNOSIS — I4891 Unspecified atrial fibrillation: Secondary | ICD-10-CM

## 2011-03-25 DIAGNOSIS — J984 Other disorders of lung: Secondary | ICD-10-CM

## 2011-03-25 DIAGNOSIS — R911 Solitary pulmonary nodule: Secondary | ICD-10-CM

## 2011-03-25 DIAGNOSIS — I10 Essential (primary) hypertension: Secondary | ICD-10-CM

## 2011-03-25 DIAGNOSIS — I2699 Other pulmonary embolism without acute cor pulmonale: Secondary | ICD-10-CM

## 2011-03-25 MED ORDER — DILTIAZEM HCL ER COATED BEADS 180 MG PO CP24: 180.0000 mg | ORAL_CAPSULE | Freq: Every day | ORAL | Status: DC

## 2011-03-25 NOTE — Patient Instructions (Signed)
Increase Cardizem (diltiazem) to 180mg  by mouth once daily. You will need to stop your 120mg  capsules and start the new prescription for the 180mg  capsules. Non-Cardiac CT scanning, (CAT scanning), is a noninvasive, special x-ray that produces cross-sectional images of the body using x-rays and a computer. CT scans help physicians diagnose and treat medical conditions. For some CT exams, a contrast material is used to enhance visibility in the area of the body being studied. CT scans provide greater clarity and reveal more details than regular x-ray exams. Your physician wants you to follow-up in: 6 months. You will receive a reminder letter in the mail one-two months in advance. If you don't receive a letter, please call our office to schedule the follow-up appointment.

## 2011-03-25 NOTE — Assessment & Plan Note (Signed)
Heart rate does not seem to be adequately controlled, and patient does have some symptoms as noted. Plan will be to increase Cardizem CD 180 mg daily.

## 2011-03-25 NOTE — Assessment & Plan Note (Signed)
Bilateral pulmonary emboli diagnosed in September, prior event in 2003. Patient also has lupus anticoagulant. Anticipate long-term use of Coumadin.

## 2011-03-25 NOTE — Assessment & Plan Note (Signed)
Noted by CT scan in September 2011. We will go ahead and arrange a followup noncontrasted chest CT to review this. Results to be forwarded to Dr. Lysbeth Galas.

## 2011-03-25 NOTE — Progress Notes (Signed)
Clinical Summary [name] is a 77 y.o.male presenting for routine followup. He was seen in November 2011. He reports compliance with medications including Coumadin, no bleeding problems mentioned. He reports stable dyspnea on exertion, also intermittent palpitations and vague chest discomfort at times.  He states he has been following with Dr. Lysbeth Galas, has not had a followup chest CT scan as yet to follow up on prior pulmonary nodule. We discussed this today.  Also reviewed medication adjustments aimed at better heart rate control of atrial fibrillation.   Allergies  Allergen Reactions  . Procaine Hcl     REACTION: jerk    Current outpatient prescriptions:albuterol (PROAIR HFA) 108 (90 BASE) MCG/ACT inhaler, Inhale 2 puffs into the lungs every 6 (six) hours as needed.  , Disp: , Rfl: ;  albuterol (PROVENTIL) (2.5 MG/3ML) 0.083% nebulizer solution, Take 2.5 mg by nebulization every 6 (six) hours as needed.  , Disp: , Rfl: ;  calcium carbonate (OS-CAL) 600 MG TABS, Take 600 mg by mouth 2 (two) times daily with a meal.  , Disp: , Rfl:  cloNIDine (CATAPRES) 0.1 MG tablet, Take 0.1 mg by mouth 2 (two) times daily.  , Disp: , Rfl: ;  diphenhydrAMINE (BENADRYL) 25 mg capsule, Take 25 mg by mouth every 6 (six) hours as needed.  , Disp: , Rfl: ;  folic acid (FOLVITE) 1 MG tablet, Take 1 mg by mouth daily.  , Disp: , Rfl: ;  furosemide (LASIX) 40 MG tablet, Take one by mouth daily   , Disp: , Rfl:  gabapentin (NEURONTIN) 300 MG capsule, Take 300 mg by mouth 3 (three) times daily.  , Disp: , Rfl: ;  HYDROcodone-acetaminophen (VICODIN) 5-500 MG per tablet, Take one by mouth twice daily  , Disp: , Rfl: ;  methotrexate (RHEUMATREX) 2.5 MG tablet, Take 5 tablets by mouth twice weekly , Disp: , Rfl: ;  Omega-3 Fatty Acids (FISH OIL) 1000 MG CAPS, Take one by mouth twice daily  , Disp: , Rfl:  omeprazole (PRILOSEC) 20 MG capsule, Take one by mouth twice daily  , Disp: , Rfl: ;  oxyCODONE-acetaminophen (PERCOCET)  5-325 MG per tablet, Take one by mouth three times daily  , Disp: , Rfl: ;  potassium chloride SA (K-DUR,KLOR-CON) 20 MEQ tablet, Take 20 mEq by mouth 2 (two) times daily.  , Disp: , Rfl: ;  predniSONE (DELTASONE) 10 MG tablet, Use as directed  , Disp: , Rfl: ;  warfarin (COUMADIN) 2 MG tablet, Use as directed  , Disp: , Rfl:  zolpidem (AMBIEN) 10 MG tablet, Take 10 mg by mouth at bedtime as needed.  , Disp: , Rfl: ;  DISCONTD: diltiazem (CARDIZEM CD) 120 MG 24 hr capsule, Take 120 mg by mouth daily.  , Disp: , Rfl: ;  diltiazem (CARDIZEM CD) 180 MG 24 hr capsule, Take 1 capsule (180 mg total) by mouth daily., Disp: 30 capsule, Rfl: 7;  DISCONTD: OXYGEN-HELIUM IN, Inhale into the lungs.  , Disp: , Rfl:   Past Medical History  Diagnosis Date  . Rheumatoid arthritis   . COPD (chronic obstructive pulmonary disease)   . Disseminated herpes zoster   . GERD (gastroesophageal reflux disease)   . Asthma   . Hypertension   . Pulmonary embolus     2003 and 2011  . Pulmonary nodules      Chest CT 09/11  . Mixed hyperlipidemia   . Atrial fibrillation     CHADS2 score 2  . Otitis externa, left  Social History [name] reports that he quit smoking about 13 years ago. His smoking use included Cigarettes. He quit after 57 years of use. He has never used smokeless tobacco. [name] reports that he does not drink alcohol.  Review of Systems As outlined above, otherwise reviewed and negative.  Physical Examination Filed Vitals:   03/25/11 1008  BP: 127/82  Pulse: 95  Overweight, bearded male, no acute distress, wearing oxygen via nasal cannula. HEENT: Conjunctiva and lids normal, oropharynx with poor dentition. Neck: Supple, no elevated JVP or carotid bruits. Lungs: Diminished, coarse breath sounds throughout, slight end expiratory wheeze. Cardiac: Regular rate and rhythm, no S3. Abdomen: Soft, nontender, bowel sounds present. Extremities: No pitting edema. Skin: Diffuse epidermal scarring  status post disseminated zoster. Musculoskeletal: No kyphosis. Neuropsychiatric: Alert and oriented x3, affect appropriate.   ECG Atrial fibrillation and 117 beats per minute, leftward axis.   Problem List and Plan

## 2011-03-25 NOTE — Assessment & Plan Note (Signed)
Continue present regimen.

## 2011-03-28 ENCOUNTER — Telehealth: Payer: Self-pay | Admitting: *Deleted

## 2011-03-28 NOTE — Telephone Encounter (Signed)
DANIELLE IS REQUESTING TO SPEAK WITH LISA ABOUT PT GETTING EPIDURAL INJECTION AND "BRIDGING".

## 2011-03-29 ENCOUNTER — Ambulatory Visit (HOSPITAL_COMMUNITY)
Admission: RE | Admit: 2011-03-29 | Discharge: 2011-03-29 | Disposition: A | Discharge status: Home / Self Care | Payer: Medicare PPO | Source: Ambulatory Visit | Attending: Cardiology | Admitting: Cardiology

## 2011-03-29 DIAGNOSIS — R911 Solitary pulmonary nodule: Secondary | ICD-10-CM

## 2011-03-29 DIAGNOSIS — J984 Other disorders of lung: Secondary | ICD-10-CM | POA: Insufficient documentation

## 2011-03-31 ENCOUNTER — Telehealth: Payer: Self-pay | Admitting: *Deleted

## 2011-03-31 ENCOUNTER — Other Ambulatory Visit: Payer: Self-pay | Admitting: Neurosurgery

## 2011-03-31 DIAGNOSIS — M549 Dorsalgia, unspecified: Secondary | ICD-10-CM

## 2011-03-31 NOTE — Telephone Encounter (Signed)
Pt notified of results of CT and verbalized understanding. Results will also be faxed to Dr. Lysbeth Galas. Pt is aware to have CT repeated in 6-12 months.

## 2011-03-31 NOTE — Telephone Encounter (Signed)
Message copied by Cyril Loosen on Thu Mar 31, 2011  8:29 AM ------      Message from: MCDOWELL, Remi Deter      Created: Wed Mar 30, 2011 11:32 AM       Reviewed report. Indicates stable appearance of pulmonary nodules. Please forward copy of this report to Dr. Lysbeth Galas for his review. A followup scan has been recommended in 6-12 months.

## 2011-04-05 ENCOUNTER — Ambulatory Visit
Admission: RE | Admit: 2011-04-05 | Discharge: 2011-04-05 | Disposition: A | Discharge status: Home / Self Care | Payer: Medicare PPO | Source: Ambulatory Visit | Attending: Neurosurgery | Admitting: Neurosurgery

## 2011-04-05 DIAGNOSIS — M549 Dorsalgia, unspecified: Secondary | ICD-10-CM

## 2011-05-06 ENCOUNTER — Other Ambulatory Visit: Payer: Self-pay | Admitting: Neurosurgery

## 2011-05-06 DIAGNOSIS — M5410 Radiculopathy, site unspecified: Secondary | ICD-10-CM

## 2011-05-06 DIAGNOSIS — M549 Dorsalgia, unspecified: Secondary | ICD-10-CM

## 2011-05-10 NOTE — H&P (Signed)
[name], [name]                ACCOUNT NO.:  [id number]   MEDICAL RECORD NO.:  [id number]          PATIENT TYPE:  INP   LOCATION:  3038                         FACILITY:  [facility]   PHYSICIAN:  Hilda Lias, M.D.   DATE OF BIRTH:  06-25-1931   DATE OF ADMISSION:  09/10/2008  DATE OF DISCHARGE:                              HISTORY & PHYSICAL   [name] is a gentleman who was seen by me in my office since several  years ago with back pain, radiating to the left leg.  The patient got  better.  Many years ago, I did L4-L5 fusion on him.  He came back  complaining of worsening pain and and not only was going to the left leg  but also to the right one.  The pain was mostly associated with walking.  We did x-ray which showed that he has stenosis at the L3-L4 with the  area where he had surgery __________.   PAST MEDICAL HISTORY:  Had lumbar fusion, cataract surgery.   ALLERGIES:  The patient is allergic to NOVOCAIN.   SOCIAL HISTORY:  Negative.   FAMILY HISTORY:  Negative.   REVIEW OF SYSTEMS:  Positive for COPD, arthritis, and back pain.   PHYSICAL EXAMINATION:  GENERAL:  The patient came to my office limping  from the left leg.  HEAD, EARS, NOSE, AND THROAT:  Normal.  NECK:  Normal.  LUNGS:  With __________ rhonchi bilaterally.  There is bilateral  wheezing.  CARDIOVASCULAR:  Normal.  ABDOMEN:  Normal.  EXTREMITIES:  Normal pulse.  NEUROLOGIC:  He has weakness of the __________ of the iliopsoas.  Femoral stretch maneuver was positive bilaterally.  He has a decreased  __________ to lumbar spine.   X-ray shows stenosis at L3-L4.  He has had fusion which is solid at L4-  L5.   CLINICAL IMPRESSION:  Lumbar stenosis, L3-L4.  __________ the patient is  being admitted  for surgery The procedure will be an L3-L4 laminectomy  with posterolateral arthrodesis with BMP.  The patient knows all of the  risks including infection, CSF leak, worsening pain, paralysis, no  improvement  whatsoever, and need for further  surgery.           ______________________________  Hilda Lias, M.D.    EB/MEDQ  D:  09/10/2008  T:  09/11/2008  Job:  [id number]

## 2011-05-10 NOTE — Op Note (Signed)
[name], [name]                ACCOUNT NO.:  [id number]   MEDICAL RECORD NO.:  [id number]          PATIENT TYPE:  INP   LOCATION:  3038                         FACILITY:  [facility]   PHYSICIAN:  Hilda Lias, M.D.   DATE OF BIRTH:  1931/07/05   DATE OF PROCEDURE:  09/10/2008  DATE OF DISCHARGE:                               OPERATIVE REPORT   PREOPERATIVE DIAGNOSIS:  L3-L4 stenosis, status post L4-L5 fusion.   POSTOPERATIVE DIAGNOSIS:  L3-L4 stenosis, status post L4-L5 fusion.   PROCEDURE:  Bilateral L3 laminectomy, decompression of the thecal sac,  foraminotomy, posterolateral arthrodesis with BMP and autograft.   SURGEON:  Hilda Lias, MD   ASSISTANT:  Danae Orleans. Venetia Maxon, MD   CLINICAL HISTORY:  The patient had been complaining of back pain  radiating to both legs.  In the past, this gentleman underwent fusion at  the L4-L5.  X-rays shows stenosis at the level of L3-L4.  Surgery was  advised.   PROCEDURE:  The patient was taken to the OR and he was positioned in a  prone manner.  The skin was cleaned with DuraPrep.  We were able to fill  the spinal processes of L3.  An incision was made from L3 down to L4  area.  The muscle was retracted all the way laterally until we were able  to see the facet and the beginning of transverse process at L3-L4.  We  were able to see the top part of the L4 pedicle screws.  Then with the  [place], we removed the spinous process of L3 as well as the lamina.  Using the 1 or 2 mm Kerrison punch as well as the drill, we decompressed  the thecal sac all the way laterally until the facet.  Foraminotomy was  done.  There was quite a bit of adhesion and calcified ligament on top  of the upper part of L4 and decompression was done until we were able to  find the space for the thecal sac.  Then, the periosteum of the facet  and tranverse process was drilled and a mix of autograft and BMP was  used for arthrodesis.  From then on, the wound was closed  with Vicryl  and Steri-Strips.           ______________________________  Hilda Lias, M.D.     EB/MEDQ  D:  09/10/2008  T:  09/11/2008  Job:  [id number]

## 2011-05-13 NOTE — Consult Note (Signed)
NAME:  [name], [name] NO.:  [id number]   MEDICAL RECORD NO.:  [id number]                   PATIENT TYPE:   LOCATION:                                       FACILITY:   PHYSICIAN:  Aundra Dubin, M.D.            DATE OF BIRTH:   DATE OF CONSULTATION:  08/15/2002  DATE OF DISCHARGE:                             RHEUMATOLOGY CONSULTATION   CHIEF COMPLAINT:  Rheumatoid arthritis.   HISTORY OF PRESENT ILLNESS:  The patient has continued on 40 mg of  prednisone a day.  He says he continues to receive the medicine from Dr.  Anne Hahn.  I believe he is taking 20 mg of methotrexate each Friday.  He feels  his hands and wrists are not bad.  His main complaint today is his back and  left leg.  About one month ago, he was picking up a cooler and felt  something snap in his back.  He has streaking sharp pain down into his  buttocks and thigh that sometimes goes down to the left ankle.  The calf  feels tight.  He has lost no weight.  He has had no URI's, fever, cough,  nausea, stomatitis, shortness of breath, polyuria, polydipsia.  His weight  is up 3 pounds.   MEDICATIONS:  1. Prednisone 40 mg q.d.  2. Naproxen 500 mg b.i.d.  3. Methotrexate 20 mg each week.  4. Folic acid 1 mg q.d.  5. Lasix 40 mg q.d.  6. Viagra p.r.n.  7. Prevacid 30 mg q.d.  8. Flexeril 20 mg h.s.   PHYSICAL EXAMINATION:  VITAL SIGNS:  Weight 215 pounds, blood pressure  148/88, respirations 16.  SKIN:  There is some chronic bruising to the forearms.  LUNGS:  Clear.  HEART:  Regular.  EXTREMITIES:  Lower extremities:  1-2+ edema.  NECK:  Negative JVD.  MUSCULOSKELETAL:  The hands have a chronic swollen appearance across the  MCP's through the wrist but they are cool and have little tenderness.  Elbows and shoulders move with some mild stiffness but through a good range  of motion.  The back was nontender except in the left low back.  The knees  were cool and flexed easily without  tenderness.  The ankles and feet were  nontender.  NEUROLOGIC:  I believe he had a positive left straight leg lift.  With  having him walk on his heels, there was some easy giving to the left foot.   ASSESSMENT AND PLAN:  1. Rheumatoid arthritis.  He will continue on the methotrexate at 20 mg a     week.  He has now been on this consistently for about six weeks.  Labs     checked on July 26, 2002 showed a hemoglobin of 12.9, WBC 15.5,     platelets 337.  Albumin 3.5, AST 18, creatinine 1.3.  We will check labs     again in two  weeks.  Concerning the prednisone, I expressed that I am     dismayed that he is still on 40 mg a day.  He should be having no pain     with arthritis at this time and has very little.  He will lower the     prednisone to 30 mg a day for one week, then 20 mg a day until return.  2. Back pain and radiculopathy.  I believe he has some slight foot drop.  We     are sending him for a lumbar MRI and if we find findings consistent with     this then we will have him seen by a neurosurgeon as soon as possible.   He will return in two months.                                                Aundra Dubin, M.D.    WWT/MEDQ  D:  08/15/2002  T:  08/15/2002  Job:  [id number]

## 2011-05-13 NOTE — Discharge Summary (Signed)
   NAME:  [name], [name]                          ACCOUNT NO.:  [id number]   MEDICAL RECORD NO.:  [id number]                   PATIENT TYPE:  INP   LOCATION:  3006                                 FACILITY:  [facility]   PHYSICIAN:  Hewitt Shorts, M.D.            DATE OF BIRTH:  November 12, 1931   DATE OF ADMISSION:  09/10/2002  DATE OF DISCHARGE:  09/15/2002                                 DISCHARGE SUMMARY   HISTORY OF PRESENT ILLNESS:  The patient is a 77 year old man, a patient of  Dr. Cassandria Santee, who had back and left leg pain. He had a history of rheumatoid  arthritis. Past history is notable for rheumatoid arthritis. General  examination showed some rhonchi bilaterally. He has some weakness of his  dorsiflexors bilaterally. He had edema in his distal lower extremities  bilaterally. His MRI showed degeneration of this L4-5 level.   HOSPITAL COURSE:  The patient was admitted by Dr. Jeral Fruit and taken to  surgery by Dr. Jeral Fruit. He performed a L4-5 diskectomy and lumbar fusion.  Following surgery, the patient has done fairly well; however, he had  difficulties with hypoxia requiring support with oxygen. We did obtain a  pulmonary medicine consultation from Dr. Sandrea Hughs who initiated  treatment with Serevent and recommended continuing the Serevent one puff  b.i.d. following discharge. He recommended followup in his office and  instructed the patient to do so in the next two to three weeks. The patient  also needs to followup with Dr. Jeral Fruit in two to three weeks with AP and  lateral lumbar spine x-rays. The patient is being given a prescription for  Percocet one to two tablets p.o. q.4-6h. p.r.n. pain, 40 tablets prescribed  with no refills.   DISCHARGE DIAGNOSIS:  Disk degeneration, lumbar spondylosis, lumbar  radiculopathy. Notably, the patient did undergo physical therapy during the  postoperative period.                                               Hewitt Shorts, M.D.    RWN/MEDQ  D:  09/15/2002  T:  09/18/2002  Job:  [id number]

## 2011-05-13 NOTE — Consult Note (Signed)
NAME:  [name], [name]                          ACCOUNT NO.:  [id number]   MEDICAL RECORD NO.:  [id number]                   PATIENT TYPE:  INP   LOCATION:  5501                                 FACILITY:  [facility]   PHYSICIAN:  Coletta Memos, MD                    DATE OF BIRTH:  05/15/1931   DATE OF CONSULTATION:  10/11/2002  DATE OF DISCHARGE:                                   CONSULTATION   CHIEF COMPLAINT:  Increasing back pain.   HISTORY OF PRESENT ILLNESS:  The patient is a patient of Dr. Adam Phenix, my  partner, who was taken to the operating room on September 10, 2002, for an  L4-5 herniated disk and spondylolisthesis.  He underwent a posterior lumbar  interbody fusion and posterolateral pedicle screws.  Postoperatively, he had  hypoxic respiratory failure.  The pulmonary service, specifically Dr. Sherene Sires,  was consulted on September 20.  He was discharged home with adequate  saturation and seemed to be doing better.  He says with regards to his pain,  he was much improved from his preoperative state.  He was recently admitted  on October 14, by Dr. Sherene Sires for unexplained progressive dyspnea.  He  subsequently underwent Doppler scanning of the lower extremities and was  found to have DVT in the left lower extremity. He probably has also had a  pulmonary embolism.  He is now on heparin.  We were consulted because of  increasing pain in his lower back.  The patient states that his back was  doing well at the time of admission, but then he slipped and since he  slipped and his hip hit the ground and that being the only thing that hit  the ground, he has just had worsened pain in his back. There has been on  drainage or infection noted.  His back does not have an unusual appearance.   PAST MEDICAL HISTORY:  Rheumatoid arthritis and obesity.   ALLERGIES:  No known drug allergies.   MEDICATIONS:  Prednisone, Lasix, methotrexate, Guaifenesin, Bactrim,  HypoBenzocream, heparin, Protonix,  Reglan, Serevent, Ultram, Coumadin,  Tylenol, Ventolin, Atrovent, and Vicodin as needed for pain.   SOCIAL HISTORY:  He stopped smoking four years ago.  He used to be a Designer, fashion/clothing.  He does not use alcohol.   PHYSICAL EXAMINATION:  VITAL SIGNS:  Blood pressure 161/96, pulse 97,  respiratory rate 20, oxygen saturation 97 on 2 liters O2.  GENERAL:  He was sitting in bed rather comfortably. He has markedly swollen  lower extremities.  Most prominent finding is some weakness in the left EHL.  Light touch is  intact.  Coordination appeared to be normal, but I did not have him try to  walk, just sitting on the side of the bed. Lumbar incision is well healed.  There are no signs of infection. He was wearing his corset while sitting in  bed.   The patient had increasing pain.  Dr. Sherene Sires started Ultram which seems to  have had some beneficial effect.  Certainly, we do not have any objection to  the Ultram.  What I will do is order AP and lateral spine films for the  patient tomorrow.  It may just be that he stirred up something with the  slip.  It is not unusual at all to have significant back pain after an  operation of his extent.  The x-rays will be ordered and I will check them  tomorrow.  I appreciate the referral.                                              Coletta Memos, MD   KC/MEDQ  D:  10/11/2002  T:  10/13/2002  Job:  [id number]

## 2011-05-13 NOTE — Consult Note (Signed)
NAME:  [name], [name] NO.:  [id number]   MEDICAL RECORD NO.:  [id number]                   PATIENT TYPE:   LOCATION:                                       FACILITY:   PHYSICIAN:  Aundra Dubin, M.D.            DATE OF BIRTH:   DATE OF CONSULTATION:  08/21/2003  DATE OF DISCHARGE:                                   CONSULTATION   INTERVAL HISTORY:  Since seeing [name] we have been able to obtain for  him Enbrel.  [name].  With  my review of how he is doing it a couple of things are wrong.  He may be  shaking the medicine a bit too vigorously and he is not waiting the full  five minutes for the medicine to dissolve.  This is now straightened out, I  suspect.  [name] but has only been  on it for about three to four injections.  He has had his usual amount of  shortness of breath.  He is on oxygen.  There have been no URIs or fever.  He generally feels okay but his hands and wrists are still tender.  His  prednisone is at 10 mg a day.  Labs from May 29, 2003 showed a WBC 8.6, HGB  13.6, PLT 173, albumin 3.4, AST 24, creatinine 1.1.   MEDICINES:  1. Methotrexate 25 mg weekly.  2. Folic acid 1 mg daily.  3. Prednisone 10 mg daily.  4. Enbrel 25 mg twice weekly.  5. Vioxx p.r.n.  6. Prevacid 30 mg daily.  7. Lasix 40 mg p.r.n.  8. K-Dur 20 mEq daily.  9. Septra every Monday/Wednesday/Friday.  10.      Coumadin.  11.      Clonidine 0.1 mg daily.  12.      Serevent Diskus p.r.n.   PHYSICAL EXAMINATION:  VITAL SIGNS:  Weight 207 pounds, blood pressure  130/60, respirations 20.  GENERAL:  No distress.  He is wearing his oxygen.  LUNGS:  He has rales and rhonchi to the lower bases.  LOWER EXTREMITIES:  No edema.  MUSCULOSKELETAL:  The hands and wrists have the chronic swollen appearance  at the MCPs and wrists.  These are improved but still have  tenderness.  Elbows, shoulders good range of motion with mild stiffness.  Knees are cool  and flex well.  The ankles and feet were nontender.   ASSESSMENT AND PLAN:  1. Rheumatoid arthritis.  I have spent some extra time with [name]     discussing how to mix and use the Enbrel correctly.  I believe he is more     on track at this time.  Overall, the arthritis is stable.  I am greatly     hopeful that he can lower the prednisone.  I  have written down him to     stay on 10 mg of prednisone a day until September 26, 2003.  He will then     lower to 5 mg a day until October 16, 2003 and if he is wanting to lower     further he will take it every-other day.  I have discussed that he could     stop on November 10, 2003 but I believe if he is on 5 mg or 5 mg every-     other day this is an improvement.  He will continue with the methotrexate     and Septra as above.  We are checking labs today.  2. Severe chronic obstructive pulmonary disease.  This is O2 dependent.   He will return in three months.                                               Aundra Dubin, M.D.    WWT/MEDQ  D:  08/21/2003  T:  08/21/2003  Job:  [id number]   cc:   Dorise Hiss, M.D.  [address]  [place]  [place] [address]  Fax: [phone]

## 2011-05-13 NOTE — Op Note (Signed)
NAME:  [name], [name]                          ACCOUNT NO.:  [id number]   MEDICAL RECORD NO.:  [id number]                   PATIENT TYPE:  INP   LOCATION:  3006                                 FACILITY:  [facility]   PHYSICIAN:  Tanya Nones. Jeral Fruit, MD               DATE OF BIRTH:  [birth year]   DATE OF PROCEDURE:  09/10/2002  DATE OF DISCHARGE:                                 OPERATIVE REPORT   PREOPERATIVE DIAGNOSES:  1. L4-L5 herniated disk with spondylolisthesis.  2. 4-5 osteopenia.  3. Rheumatoid arthritis.   PREOPERATIVE DIAGNOSES:  1. L4-L5 herniated disk with spondylolisthesis.  2. 4-5 osteopenia.  3. Rheumatoid arthritis.   PROCEDURES:  1. Bilateral 4-5 diskectomy.  2. Decompression of the L4-L5 nerve root.  3. Interbody fusion with allograft pedicle screws 4-5 posterolateral fusion.  4. Cell Saver.  5. C-arm.     __________.   [name] is a 77 year old gentleman who had been complaining of back and left  leg pain.  X-rays showed that he has a large herniated disk going to the  body of L4.  Unfortunately he has a spondylolisthesis between 4-5 disk  space.  He has rheumatoid arthritis and has been taking prednisone for a  long time.  Surgery was advised.  The procedure was to be as conservative as  possible.  My goal was to do the laminotomy and decompress the L4-L5 nerve  root.  Nevertheless he and his wife, who is a former patient of my, knew  that we might end up having to fuse his back.  The surgery was fully  explained to both of them.   PROCEDURE:  The patient was taken to the OR and he was positioned in a prone  manner.  The back was prepped with Betadine.  A midline incision from L4 to  S1 was made.  The muscle was retracted laterally.  By x-ray, we identified  L4-L5.  I went to the left side which was the symptomatic one where he has  weakness of the left foot and pain.  Indeed the facet was loose and I was  able to do a laminotomy to decompress the L4-L5 nerve  root medial facet.  Nevertheless the facet bilaterally was completely loose and I was afraid it  might slip down in the future.  Because of that I did an intraoperative  consult with Dr. Venetia Maxon and then proceeded with a standard fusion.  We  removed the facet at L4-L5, decompressed that level and we did a total  diskectomy.  Having good decompression, the end-plate was removed and two  pieces of allograft, of 8 x 10, were inserted.  Then we went laterally and  we found the transverse process of 4-5 and we drilled it.  A mix of Vitoss  autograft and BMX was put in in that area to help with the posterolateral  fusion.  Then we found  the pedicles of 4-5.  A pedicle probe was inserted  followed by  four screws.  __________ was applied.  The AP and lateral showed good  position of the bone graft pedicle screws.  From there on the area was  irrigated.  Arterial fat was left to cover the dura mater.  The wound was  closed with Vicryl and Steri-Strips.                                               Tanya Nones. Jeral Fruit, MD    EMB/MEDQ  D:  09/10/2002  T:  09/11/2002  Job:  [id number]

## 2011-05-13 NOTE — Op Note (Signed)
NAME:  [name], [name]                          ACCOUNT NO.:  [id number]   MEDICAL RECORD NO.:  [id number]                   PATIENT TYPE:  INP   LOCATION:  3006                                 FACILITY:  [facility]   PHYSICIAN:  Casimiro Needle B. Sherene Sires, M.D. [facility]           DATE OF BIRTH:  1931/08/10   DATE OF PROCEDURE:  09/14/2002  DATE OF DISCHARGE:  09/15/2002                                 OPERATIVE REPORT   REASON FOR CONSULTATION:  Postoperative  hypoxemia.   HISTORY:  The patient is a very complicated 77 year old white male with  morbid obesity and possible COPD status post smoking cessation in 1999.  He  has required prednisone chronically for rheumatoid arthritis under the care  of Dr. Kellie Simmering and is maintained at 20 mg per day.  He has noted progressive  weight gain since he stopped smoking and took steroids and is now  postoperative on September 16 with an L4-5 diskectomy.  He has been on  oxygen postoperatively as well as nebulizers but when his oxygen is stopped,  he drops into the 80s.  He states he is no different from baseline in terms  of symptoms.   However, on closer questioning, he does have a bit of cough and congestion  over baseline.  His prednisone has been restarted postoperatively and he has  been on nebulizers, it appears that his Prevacid has been on hold.  (Denies  any overt reflux symptoms, however).  He also denies any chest pain,  orthopnea, PND, significant nasal congestion, abdominal complaints or leg  swelling.   His baseline is that he is able to ambulate a slow pace typically limited  more by his back than his breathing, however.  He can slow pace on a flat  surface, can not do any steps or exertion.  He denies at baseline orthopnea,  PND, daytime hypersomnolence or significant leg swelling.   Past medical history is significant for  1. Obesity.  Note that his bicarb on admission was 33 suggesting possible     obesity hypoventilation syndrome.  2.  Rheumatoid arthritis.  3. Possible COPD although not on breathing treatments preadmission.   ALLERGIES:  None known.   MEDICATIONS LISTED:  1. Prednisone 20 mg daily.  2. Naprosyn.  3. Flexeril.  4. Viagra.  5. Prevacid.  6. Tylenol.  7. Lasix.  8. K-Dur.  9. Folic acid.  10.      Methotrexate every Friday.  11.      Sulfasalazine.  12.      Septra.  13.      Hydrocodone.   Doses not specified.   SOCIAL HISTORY:  He quite smoking in 1999.  He denies any alcohol use.   FAMILY HISTORY:  Positive for rheumatoid arthritis, negative for respiratory  diseases or premature heart disease.   REVIEW OF SYSTEMS:  Taken in detail, essentially negative.  He feels his  rheumatism is  under okay control and that Dr. Shon Hough has been working  on tapering the prednisone.  He has allowed Dr. Shon Hough to start working  on tapering his prednisone.   PHYSICAL EXAMINATION:  GENERAL:  The patient is an obese white male who is  somewhat cantankerous and has trouble answering questions in a  straightforward manner.  When I began asking him about his breathing, he  began telling me a story about the hospital and their water supply which I  really could not understand.  I think he was trying to provide an analogy  for me but I failed to understand the analogy.  VITAL SIGNS:  He is afebrile with normal vital signs.  HEENT:  Oropharynx is good but the airway is poor.  Dentition is intact.  Neck supple without cervical adenopathy or tenderness. He has a full beard.  There is no palpable thyromegaly and no apparent JVD, cervical adenopathy,  ____________ or tracheal deviation.  LUNGS:  The chest is slightly barrel shaped with diminished breath sounds  and few rhonchi bilaterally.  Overall aeration is diminished.  ABDOMEN:  Massively obese but always benign with positive ________ sign at  the end of  inspiration and poor inspiratory excursion in supine position.  CARDIAC:  Regular rate and rhythm  without murmurs, rubs or gallops present.  No increase in the pulmonic component to S2.  EXTREMITIES:  Warm without calf tenderness, cyanosis, clubbing or edema.  Pedal pulses are slightly decreased symmetrically.  PAS hose are in place.  NEUROLOGIC:  No focal deficits although I thought his mood and affect were a  little bit unusual.  He did appear alert and appropriate and oriented times  four.   Chest x-ray is pending.  Serum bicarb as noted was 33 on admission.  Hemoglobin saturation is 84% on room air, adequate on 2L nasal prongs.   IMPRESSION:  I suspect this patient has chronic hypoxemic respiratory  failure that is present when he sleeps at night or remains supine  combination of COPD and obesity that is present.  That is not to say that  this is an ideal situation and identify the following the problems that need  to be addressed.  1. I believe he has COPD with minimal asthmatic component that is status     post smoking cessation in 1999.  The steroids are probably controlling     the inflammatory component but he could benefit from more rigorous     bronchodilatation.  I believe he would benefit from albuterol and     Atrovent in combination at time of discharge and also the addition of     guaifenesin LA 600 mg tablets two b.i.d. and incentive spirometry in his     present regimen to help improve VQ matching and improve mucociliary     function.  2. His obesity needs to be addressed long term.  I will defer that to Dr.     Kellie Simmering and his primary physician.  I do believe he is at high risk of     obstructive sleep apnea and needs to be considered for a sleep study as     an outpatient.  3. Finally, he has rheumatoid arthritis which is steroid dependent.  We need     to be sure that he does not have any eminent lung disease either due     either to rheumatism or methotrexate exposure.  A follow-up chest x-ray    is pending for this purpose.  My hope is that he could be  discharged on home nebulizer, perhaps even on  home oxygen if he will comply with it.  I also did not however, make any  change in his prednisone dose which would appear to be adequate to control  inflammatory component to his airways disease and needs to be further  titrated down as much as possible by Dr. Kellie Simmering.                                                   Charlaine Dalton. Sherene Sires, M.D. [facility]    MBW/MEDQ  D:  09/14/2002  T:  09/16/2002  Job:  [id number]   cc:   Aundra Dubin, M.D.  [address]  [place]  [place] [address]  Fax: [phone]   Tanya Nones. Jeral Fruit, M.D.  [address]  [place], [place] [address]  Fax: [phone]

## 2011-05-13 NOTE — H&P (Signed)
NAME:  [name], [name]                          ACCOUNT NO.:  [id number]   MEDICAL RECORD NO.:  [id number]                   PATIENT TYPE:  INP   LOCATION:  2859                                 FACILITY:  [facility]   PHYSICIAN:  Tanya Nones. Jeral Fruit, MD               DATE OF BIRTH:  [birth year]   DATE OF ADMISSION:  09/10/2002  DATE OF DISCHARGE:                                HISTORY & PHYSICAL   HISTORY OF PRESENT ILLNESS:  The patient is a gentleman who has been seem by  me because of back and left leg pain.  The patient has history of rheumatoid  arthritis, and he had been taken care of by his physician for many years.  The patient had been complaining of more pain from his back down to the left  leg, and he is quite miserable.  When he came to see me about two weeks ago,  he was limping from the left leg.  At that time, we looked at the MRI as  well as the lumbar spine x-ray.  Because of the finding, the patient wanted  to go ahead with surgery.   PAST MEDICAL HISTORY:  Rheumatoid arthritis.   CURRENT MEDICATIONS:  He had been on Naprosyn, Viagra, Lasix, folic acid,  methotrexate, and Flexeril.   FAMILY HISTORY:  Unremarkable.   SOCIAL HISTORY:  The patient does not smoke and does not drink.  He is 6  feet 1 inch, 250 pounds.   REVIEW OF SYSTEMS:  Positive for rheumatoid arthritis and swelling of the  joint.   PHYSICAL EXAMINATION:  GENERAL:  The patient came to my office with his  wife.  He was limping from the left leg.  HEENT:  Normal.  NECK:  Normal.  LUNGS:  There are some rhonchi bilaterally.  CARDIOVASCULAR:  Normal.  ABDOMEN:  Normal.  EXTREMITIES:  Normal pulses with swelling at the level of the knee.  NEUROLOGIC:  Mental status and cranial nerves normal.  Strength: He has  weakness on dorsiflexion of both feet.  There is grade I edema of the lower  extremities.  Sensory: He had complaints of numbness of the left foot.  Reflexes 1+, no Babinski.  His straight leg  raising on the right side was 80  degrees, on the left side about 45 degrees.   LABORATORY DATA:  The MRI showed that he has a step off at the level of 4-5  which moved with flexion and extension.  There was minimal step off between  3 and 4 which does not move.  The MRI showed that he has a calcified  herniated disk at the level of 4-5.   CLINICAL IMPRESSION:  Calcified herniated disk at the level of 4-5 with  spondylolisthesis at 4-5.   RECOMMENDATIONS:  The patient will be admitted for surgery. We are going see  if we can remove the disk without doing any  type of laminectomy, but I am  really concerned about the calcification.  If we need to do a laminectomy,  the most likely we will have to proceed with interbody fusion, pedicle  screws, and posterolateral fusion.  The surgery was fully explained to him  and his wife who is a former patient of mine.  The risks, or course,  are infection,no fusion because of chronic history of rheumatoid arthritis  on prednisone and methotrexate, damage to vessels of the abdomen, failure of  the bone graft to take, failure of the pedicle screw, and all the risks  associated with his health.                                               Tanya Nones. Jeral Fruit, MD    EMB/MEDQ  D:  09/10/2002  T:  09/10/2002  Job:  [id number]

## 2011-05-13 NOTE — Consult Note (Signed)
NAME:  [name], [name]                          ACCOUNT NO.:  [id number]   MEDICAL RECORD NO.:  [id number]                   PATIENT TYPE:   LOCATION:                                       FACILITY:   PHYSICIAN:  Aundra Dubin, M.D.            DATE OF BIRTH:  [birth year]   DATE OF CONSULTATION:  DATE OF DISCHARGE:                                   CONSULTATION   CHIEF COMPLAINT:  Rheumatoid arthritis.   HISTORY OF PRESENT ILLNESS:  [name] returns reporting that he is still  aching in his hands, wrists, and feet.  His hands and wrists are the worse  areas and he is stiff in the mornings for about 2 hours. He is keeping the  prednisone down to 10 mg a day. He has his chronic shortness of breath and  is on oxygen, but this has not worsened. There has been no fever.  He has  some cough secondary to his long-term smoking. His weight is down 6 pounds.  His overall pain is moderate aching.  Warm water in the mornings helps his  hands.   MEDICATIONS:  1. Prednisone 10 mg daily.  2. Methotrexate 25 mg weekly.  3. Folic acid 1 mg daily.  4. Septra 3 times weekly.  5. Prevacid 10 mg daily.  6. Lasix 40 mg daily.  7. Coumadin.  8. Serevent.  9. He is on 2 liters of oxygen.  10.      Calcium 600 mg b.i.d.   PHYSICAL EXAMINATION:  VITAL SIGNS:  Weight 224 pounds.  Blood pressure  100/70, respirations 20.  SKIN:  He has bruising to the distal forearms.  The skin is thin secondary  to long-term prednisone.  LUNGS:  Lungs have quiet sounds and slight rales to the bases.  HEART:  Regular.  MUSCULOSKELETAL:  The MCPs and wrists remain moderately swollen with warmth  and tenderness.  Elbows and shoulders move with stiffness.  The knees are  cool, but tender with flexion at 130 degrees.  The ankles and feet were  nontender.   ASSESSMENT AND PLAN:  1. RHEUMATOID ARTHRITIS.  His arthritis is still active.  He will continue     with methotrexate, prednisone, Septra, and folic acid, as  above.  Labs     checked on 12/23/02 show a WBC of 10.0, HGB 12.7, platelets 265, albumin     3.8, AST 26, creatinine 0.9.  We will check labs today.   He needs more help with medicines.  He does not have a supplemental  insurance policy to coordinate with Medicare. This will prevent him using  Remicade with a general $300 charge that Medicare does not pick up.  We will  try and get him Enbrel through a foundation that obtains it for free.  If we  cannot get this medicine then we will try for Humira.  1. OSTEOPOROSIS ISSUES.  His DEXA  scan of 01/23/2003 showed a hip T score of     minus 1.7 and a right forearm of minus 0.05.  For the time being he will     stay on Calcium 600 mg with vitamin D b.i.d.   He will return in 2 months.                                               Aundra Dubin, M.D.    WWT/MEDQ  D:  03/06/2003  T:  03/06/2003  Job:  [id number]   cc:   Dorise Hiss, M.D.  [address]  [place]  [place] [address]  Fax: [phone]

## 2011-05-13 NOTE — H&P (Signed)
NAME:  CYRIL, WOODMANSEE                          ACCOUNT NO.:  [id number]   MEDICAL RECORD NO.:  [id number]                   PATIENT TYPE:  INP   LOCATION:  5501                                 FACILITY:  [facility]   PHYSICIAN:  Casimiro Needle B. Sherene Sires, M.D. [facility]           DATE OF BIRTH:  16-Dec-1931   DATE OF ADMISSION:  10/08/2002  DATE OF DISCHARGE:                                HISTORY & PHYSICAL   HISTORY OF PRESENT ILLNESS:  This is a complex 77 year old white male former  smoker with morbid obesity, COPD status post smoking cessation in 1999, and  steroid-dependent rheumatoid arthritis. I had seen in consultation at the  request of Dr. Newell Coral on 9/20 with hypoxic respiratory failure  postoperatively but noted that he had a preoperative bicarb of 33 indicating  the possibility of obesity hypoventilation syndrome. I thought he had  minimal asthmatic components. At that point and recommended discharge on  Serevent Diskus one puff b.i.d. in addition to his usual medications. We did  document adequate saturations at the time of discharge, and I saw him back  on 9/30 complaining of worsening dyspnea, but I was not confident that he  was actually taking the medications as recommended. I reviewed his  medications with him in writing and made a medication sheet that was user  friendly and simplified. He comes back today two weeks later stating that he  is no better. In addition to having increasing dyspnea, he is also having a  hacking cough and severe hoarseness. He denies any orthopnea, pleuritic or  exertional chest pain, fevers, chills, sweats, purulent symptoms, or overt  reflux symptoms. He has had progressive symmetric bilateral leg swelling  postoperatively as other new complaints since discharge.   PAST MEDICAL HISTORY:  1. Status post lumbar surgery 9/16 for an L4-5 diskectomy.  2. Obesity.  3. Rheumatoid arthritis, followed by Dr. Kellie Simmering.   ALLERGIES:  None known.    MEDICATIONS:  1. Prednisone at 20 mg daily.  2. Furosemide 40 mg one b.i.d.  3. Methotrexate 2.5 mg eight tablets every week.  4. Guiafenesin DM two b.i.d.  5. Bactrim one every Monday, Wednesday, Friday.  6. Cyclobenzaprine 10 mg p.o. t.i.d.  7. Prevacid 20 mg one q.d.  8. Potassium chloride 20 mEq q.d.  9. Folic acid 1 mg q.d.  10.      Serevent Diskus one puff b.i.d. with albuterol two puffs q.4h.     p.r.n. but minimum improvement after use.   SOCIAL HISTORY:  He quit smoking four years ago. He is retired from [employer].  He denies any unusual travel, pet, or hobby exposure.   FAMILY HISTORY:  Negative for respiratory diseases.   REVIEW OF SYMPTOMS:  Review of systems taken in detail essentially negative  except as noted above.   PHYSICAL EXAMINATION:  GENERAL:  This is an elderly white male who appeared  a little bit older than  his stated age with marked hoarseness and mild  increase work of breathing at rest. He is afebrile with normal vital signs  except for a pulse rate of 110. His weight is 248 pounds.  HEENT:  Oropharynx is clear. No excess post nasal drainage or cobblestoning.  Ear canals clear bilaterally.  NECK:  Supple without cervical adenopathy or tenderness. No masses or  megaly. Phonation was quite hoarse.  LUNGS:  Lung fields revealed diminished breath sounds but no true wheezes.  HEART:  Regular, rate, and rhythm without murmurs, rubs, or gallops. S1 and  S2 were distant.  ABDOMEN:  Obese, otherwise benign with no palpable organomegaly, masses, or  tenderness. There was no palpable ascites.  EXTREMITIES:  Warm with no calf pain. No cyanosis or clubbing. There was 2+  pitting of the knees down bilateral and symmetric. No calf tenderness.  NEUROLOGICAL:  No focal deficits, pathologic reflexes.  SKIN:  Warm and dry.   LABORATORY DATA:  Chest x-ray revealed no evidence of infiltrates or  effusions. Hemoglobin saturation was 96% on room air.   IMPRESSION:  1.  Unexplained progressive dyspnea associated with a hacking cough,     hoarseness, and progressive leg swelling in this patient who is status     post lumbar diskectomy on 9/16. I suspect the problem is multifactorial.     That is, he has COPD with incipient and obesity hypoventilation with     incipient cor pulmonale. I am concerned about the possibility of occult     thromboembolic disease based on the pattern of progressive unexplained     dyspnea and right heart failure in the postoperative setting. I recommend     he be admitted for a spiral CT scan today and, while he is in the     scanner, obtain sinus CT scan to see if we can work out the reason for     his refractory coughing and hoarseness.   Most likely, the problem here is upper airway instability syndrome that was  precipitated by the endotracheal tube during surgery, and the hacking cough  that he is experiencing is adding further trauma and insult to the upper  airway. I have recommended once we are sure that he has not had a pulmonary  embolus I plan to treat him for vigorously for cough with cough suppressants  and around-the-clock bronchodilators, perhaps with the addition of  lidocaine. In addition, I am going to increase empirically his reflux  regimen and treat with p.o. Reglan.   Finally, we need to evaluate his peripheral edema with a cardiac workup.  Will complete an echocardiogram, BNP, and twelve-lead EKG pending at time of  dictation.   Please see orders for specific medications.   1.     Rheumatoid arthritis, steroid dependent. Perhaps at this point, he is     actually overmedicated in terms of steroids adding to fluid retention.     Will take this opportunity to review his true steroid requirement,     perhaps by asking Dr. Kellie Simmering to see him.                                               Charlaine Dalton. Sherene Sires, M.D. [facility]    MBW/MEDQ  D:  10/08/2002  T:  10/09/2002  Job:  [id number]  cc:   Aundra Dubin,  M.D.  [address]  [place]  [place] [address]  Fax: 1

## 2011-05-13 NOTE — Consult Note (Signed)
[facility]  Patient:    [name], [name] Visit Number: [id number] MRN: [id number]          Service Type: RHE Location: [facility] Attending Physician:  Aundra Dubin Dictated by:   Nathaneil Canary, M.D. Proc. Date: 04/25/02 Admit Date:  04/25/2002   CC:         Dorise Hiss, M.D., [address], [place], [place] [address]   Consultation Report  HISTORY OF PRESENT ILLNESS:  Mr. Cipollone returns reporting that he is doing quite poorly.  He says he is doing about as bad as he ever has.  He is aching in his hands and wrists, ankles and feet.  He has noticed swelling to his feet and feels that his weight has gone up.  He has shortness of breath.  He did not increase the sulfasalazine to 1000 mg b.i.d. and remains on 500 mg b.i.d. His weight is up only two pounds since February of 2003.  There is no back pain, diarrhea, active rashes, stomatitis, headaches, jaw claudication, visual changes, or numbness and tingling.  PAST MEDICAL HISTORY:  1. Chronic obstructive pulmonary disease.  2. Stomach ulcers.  3. Also reviewed from February 07, 2002.  FAMILY HISTORY:  Reviewed and unchanged from February 07, 2002.  SOCIAL HISTORY:  Reviewed and unchanged from February 07, 2002.  ALLERGIES:  Reviewed and unchanged from February 07, 2002.  CURRENT MEDICATIONS:  1. Sulfasalazine 500 mg b.i.d.  2. Prednisone 20 mg q. day.  3. Naproxen 500 mg b.i.d.  4. Flexeril 10 mg b.i.d.  5. Prevacid 30 mg q. day.  6. Viagra 50 mg p.r.n.  7. Tylenol p.r.n.  8. Lasix 40 mg q. day.  9. MDI q. day. 10. K-Dur 20 mEq q. day.  PHYSICAL EXAMINATION:  VITAL SIGNS:  Weight 259 pounds, blood pressure 140/80, respirations 16.  GENERAL:  He is in no distress.  SKIN:  There is some mild ecchymosis to the arms secondary to long-term steroid use.  HEENT:  PERRL/EOMI.  Mouth clear.  NECK:  Negative JVD.  LUNGS:  Rales to the bilateral bases.  HEART:  Regular with no  murmur.  ABDOMEN:  Negative HSM, nontender.  MUSCULOSKELETAL:  His hands remain swollen and consistent with the diagnosis of rheumatoid arthritis.  The MCPs and wrists are quite warm, they are moderately tender.  Elbows extend to 0 degrees and have no nodules.  Shoulders move stiffly but through a good range of motion.  Back nontender.  Hips good range of motion.  The knees are cool but flex with significant stiffness. Lower extremities show 2-3+ pitting edema.  The ankles and feet had mild tenderness.  NEUROLOGICAL:  Strength is 5/5.  DTRs are 2+ throughout.  Negative SLR.  ASSESSMENT/PLAN: 1. Rheumatoid arthritis.  We did not get a good start on sulfasalazine.  I    believe this medicine works too slowly to further increase this in hopes of    improving his arthritis.  This is erosive rheumatoid arthritis.  I chose    the sulfasalazine at first because of his long-term exposure to prednisone.     He has been on various doses for possibly 20 years.     I have decided to place him on methotrexate starting at 12.5 mg q. week.  I    have discussed frankly with Mr. and Mrs. Bebout that he will be at increased    risk of unusual infections to include and not limited to PCP,  fungal, TB,    histoplasmosis, and coccidioidomycosis.  We will check a PPD before he    starts the methotrexate.  I am also placing him on Septra DS one tablet q.    Monday, Wednesday, and Friday.     Other potential side effects include liver cirrhosis and increased LFTs.    He does not drink alcohol.  This will be a low risk problem.  We will    follow liver enzymes per usual.  There is a slight change of cytopenias and    we will follow a CBC.  There is also a chance of a pulmonary reaction with    fever, cough, and shortness of breath.  This is quite rare.  There is also    a chance of stomatitis, nausea, headaches, skin rashes, and malaise.  He    will take methotrexate 12.5 mg a week and folic acid 1 mg q.  day.  2. Lower extremity edema and rales.  He is in some heart failure.  I strongly    encouraged him to discuss this with Dr. Anne Hahn.  He is planning on taking a trip to [place] in mid May and I again have strongly encouraged him to see Dr. Anne Hahn before he leaves on his trip.  I will see him back in early June. Dictated by:   Nathaneil Canary, M.D. Attending Physician:  Aundra Dubin DD:  04/25/02 TD:  04/27/02 Job: [id number]

## 2011-05-13 NOTE — Consult Note (Signed)
[facility]  Patient:    [name], [name] Visit Number: [id number] MRN: [id number]          Service Type: RHE Location: [facility] Attending Physician:  Aundra Dubin Dictated by:   Nathaneil Canary, M.D. Proc. Date: 03/14/02 Admit Date:  03/14/2002   CC:         Dorise Hiss, M.D.  [address]  [address] [place] [address]   Consultation Report  CHIEF COMPLAINT:  Arthritis.  HISTORY OF PRESENT ILLNESS:  [name] returns reporting that when he went down on the prednisone for about one week he could not tolerate it because pain in his hands, wrists, and shoulders, and he increased back to 20 mg and has remained there. I have reviewed the x-rays with Dr. Anselmo Pickler today. He does have a small periarticular erosion at the left second MCP. He has other degenerative changes and cystic changes at the PIPs. The feet x-rays show no erosions, but I do suspect that he has periarticular osteopenia. [name] is doing fairly well at this time. Again, he is on 20 mg of prednisone a day. The labs checked on February 07, 2002, showed a uric acid 4.5, glucose 75, creatinine 1.0, albumin 3.3, AST 19, WBC 12.1, hemoglobin 13.0, platelets 293.  PAST MEDICAL HISTORY:  Possible COPD and a stomach ulcer. I reviewed the drug intolerances, family history, and social history, and they are unchanged from February 07, 2002.  REVIEW OF SYSTEMS:  He has no fever, cough, rashes, stomatitis, numbness, tingling, headaches, vision changes, diarrhea, constipation, blood or mucus to the bowel movement.  PHYSICAL EXAMINATION:  VITAL SIGNS:  Weight 258 pounds. Blood pressure 150/90, respirations 20, pulse 80.  GENERAL:  No distress.  SKIN:  He has bruises to the forearms consistent with long-term steroid use.  HEENT:  PERRL/EOMI. Mouth clear.  NECK:  Negative JVD.  LUNGS:  Quiet sounds but clear.  HEART:  Regular.  ABDOMEN:  Negative HSM, nontender.  MUSCULOSKELETAL:   The hands and wrists have a swollen appearance across the MCPs and wrists very consistent with rheumatoid arthritis. They are nontender at this point. The elbows extend fully and there are no nodules. Shoulders move with mild stiffness. The back was nontender. Neck good range of motion with slight stiffness. Hips good range of motion and the knees flex well without tenderness to 120 degrees. The ankles had some mild edema but were nontender. The compression across the MTPs was nontender.  NEUROLOGIC:  Strength is 5/5. Negative SLRs. A&O x3. Sensation intact.  ASSESSMENT AND PLAN:  Rheumatoid arthritis. This is erosive rheumatoid arthritis seen best by the x-rays with an erosion. I have not seen him worsened because he has not lowered the prednisone. I have discussed using methotrexate and sulfasalazine with him. I will try him first on sulfasalazine. He will take 500 mg b.i.d. for one week, then 1000 mg b.i.d. I am planning to increase him to 1500 mg b.i.d. on return. I have cautioned him that sulfasalazine can be associated with nausea, and he will take this with a meal. There is also a chance of skin rashes, photosensitive skin rashes, headaches, and a 1-2% chance of cytopenias. We will check a CBC on return. If the sulfasalazine is not working over about four months with a significant reduction of prednisone to less than 10 mg, we may want to use methotrexate. He is limited in his finances and does not have a Health and safety inspector  beyond Medicare.  He will return in six weeks. Dictated by:   Nathaneil Canary, M.D. Attending Physician:  Aundra Dubin DD:  03/14/02 TD:  03/15/02 Job: [id number]

## 2011-05-13 NOTE — Consult Note (Signed)
[facility]  Patient:    [name], [name] Visit Number: [id number] MRN: [id number]          Service Type: RHE Location: [facility] Attending Physician:  Aundra Dubin Dictated by:   Aundra Dubin, M.D. Proc. Date: 06/27/02 Admit Date:  06/27/2002                            Consultation Report  BRIEF HISTORY:  The patient was not able to keep the follow up appointment near early June, and I elected not to refill his methotrexate at that time because he was not able to come in for labs.  He did not notice any improvement taking the methotrexate for the 5 weeks.  He is now back taking 40 mg of prednisone.  His joints are not hurting except he has some low back pain.  He has the usual amount of shortness of breath.  There has been no polyuria or polydipsia.  His weight is down 11 pounds.  He has had no vision changes, headaches, diarrhea, jaw claudication.  He has had no chest pain, but has his usual amount of shortness of breath.  MEDICATIONS: 1. Prednisone 40 mg q. day. 2. Naproxen 500 mg b.i.d. 3. Flexeril 20 mg h.s. 4. Prevacid 30 mg q. day. 5. Viagra p.r.n. 6. Folic acid 1 mg q. day. 7. Septra Monday, Wednesday, Friday.  PHYSICAL EXAMINATION:  VITAL SIGNS:  Weight 248 pounds.  Blood pressure 130/90, respirations 18.  GENERAL:  No distress.  LUNGS:  Rhonchi to the bases.  HEART:  Regular.  LOWER EXTREMITIES:  No edema.  MUSCULOSKELETAL:  The hands have little active synovitis and were nontender. The MCPs, PIPs, and wrist all appear swollen but they are nontender secondary to the high dose of prednisone.  The elbows and shoulders have good range of motion.  The knees are cool and nontender.  The ankles and feet were nontender.  ASSESSMENT AND PLAN: 1. Rheumatoid arthritis. I will start him back on the methotrexate.  He will    take 20 mg each week.  He was having no nausea taking the medication for    the first 5 weeks.  I have  asked him to reduce the prednisone over the next    month back down to a level of 20 mg.  I am hopeful that the methotrexate    will help. I am also concerned that because of the long-term prednisone use    that opportunistic infections and pneumonias could occur.  He will continue    on the Septra t.i.d..  We will check labs today and again in 5 weeks. 2. Weight loss.  If he continues to lose weight then I will check a chest    x-ray on return.  He smoked heavily for many years and quit 4 years ago. 3. Back pain.  I will follow this to see if it progresses or improves. 4. Chronic obstructive pulmonary disease.  FOLLOWUP:  He will return in 7 weeks. Dictated by:   Aundra Dubin, M.D. Attending Physician:  Aundra Dubin DD:  06/27/02 TD:  07/01/02 Job: [id number]

## 2011-05-13 NOTE — Consult Note (Signed)
NAME:  [name], [name] NO.:  [id number]   MEDICAL RECORD NO.:  [id number]                   PATIENT TYPE:   LOCATION:                                       FACILITY:   PHYSICIAN:  Aundra Dubin, M.D.            DATE OF BIRTH:   DATE OF CONSULTATION:  12/23/2002  DATE OF DISCHARGE:                                   CONSULTATION   CHIEF COMPLAINT:  Rheumatoid arthritis.   HISTORY OF PRESENT ILLNESS:  I last saw the patient on August 15, 2002.  At  that time, he was having severe back and left leg pain.  The MRI showed a  large calcified disk fragment in the left lateral recess contacting the L4  nerve root.  He then was evaluated by Dr. Jeral Fruit, who performed a  laminectomy I believe.  He has much less left leg pain but still has some  back pain.  In mid October, he was admitted to [facility] with an  extensive left DVT and was evaluated by Dr. Sherene Sires.  He is on Coumadin at this  time.  He has lost weight and he is down about 20 pounds from being 259  pounds on Apr 25, 2002.  He is still short of breath.  He is now on oxygen  because of his COPD.  He has had no recent URI's.  He has no nausea,  stomatitis, rashes, headaches from the methotrexate.  Concerning the RA, he  feels that his hands are still quite sore and he does not have good grip  strength.  He asks about whether steroids can soften his bones.   MEDICATIONS:  1. Prednisone 10 mg q.d.  2. Methotrexate 20 mg weekly.  3. Folic acid 1 mg q.d.  4. Vioxx p.r.n.  5. Prevacid 10 mg q.d.  6. Lasix 40 mg b.i.d.  7. K-Dur 20 mEq q.d.  8. Septra DS Monday, Wednesday, and Friday.  9. Iron.  10.      Coumadin.  11.      Clonidine HCl 0.1 mg q.d.  12.      Serevent inhaler.   PHYSICAL EXAMINATION:  VITAL SIGNS:  Weight 230 pounds, blood pressure  152/80, respirations 20.  GENERAL:  He is talkative and is in no distress.  He is on oxygen  LUNGS:  He has slight rales to the bilateral bases  and quiet sounds above.  EXTREMITIES:  Lower extremities:  Trace edema.  SKIN:  Slight ecchymosis.  MUSCULOSKELETAL:  The MCP's remain moderately swollen and have mild  tenderness.  The wrists have mild tenderness.  Elbows and shoulders:  Good  range of motion with moderate stiffness.  Back nontender.  The knees are  cool and flex well.  The ankles have tenderness from the edema.  His feet  were nontender.   ASSESSMENT AND PLAN:  1. Rheumatoid arthritis.  I am continuing to see  swelling at the     metacarpophalangeal joints and will increase the methotrexate to 25 mg     weekly.  We will check labs today.  Concerning the prednisone, he will     stay on 10 mg a day until we are past most of the winter, when I will try     and lower it.  He has been on prednisone for possibly up to 10 years and     this will need to be a slow withdrawal.  2. Osteoporosis issues.  I have asked him to take calcium 600 mg with     vitamin D.  We will also check a DEXA scan.  3. Chronic obstructive pulmonary disease.  4. Recent lumbar surgery.  5. Deep vein thrombosis/pulmonary embolus.  He is on Coumadin.   He will return in 10 weeks.                                               Aundra Dubin, M.D.    WWT/MEDQ  D:  12/23/2002  T:  12/23/2002  Job:  [id number]   cc:   Dorise Hiss, M.D.  [address]  [place]  [place] [address]  Fax: [phone]   Charlaine Dalton. Sherene Sires, M.D. [facility]  [address]  [place]  [place] [address]  Fax: 1

## 2011-05-13 NOTE — Consult Note (Signed)
[facility]  Patient:    [name], [name] Visit Number: [id number] MRN: [id number]          Service Type: RHE Location: [facility] Attending Physician:  Donnal Moat Dictated by:   Nathaneil Canary, M.D. Proc. Date: 02/07/02 Admit Date:  02/07/2002   CC:         Dorise Hiss, M.D., [address], [place], [place] [address]   Consultation Report  CHIEF COMPLAINT:  Hands and feet.  Dear Dr. Anne Hahn:  Thank you for allowing me to help in [name] care. He is a 77 year old white male who tells me he has had arthritis since about 1980, and has been on prednisone ever since this time. He has been diagnosed with rheumatoid arthritis. For about six years he was treated with Ridaura. He remembers having a significantly positive rheumatoid factor. He remembers the value was "21,000." He began smoking at age 50 and smoked one to two packs a day for much of his adult life. He quit four years ago. At this time he is not greatly hurting, but his hands, wrists, and feet bother him the most. He has had some lower extremity swelling. He has some shortness of breath but minimizes this, and I suspect it is significant. He comes with labs showing a blood gas of 7.41/45/76/28. He has pulmonary function studies from May 22, 2001, which showed no obstructive lung defect and there may have been a slight degree of restrictive lung disease. Other labs show a cortisol level of 1.6, and a rheumatoid factor of 1545 (0-13.9). An ESR was 15, glucose 85, ALT 26, CO2 29, albumin 3.6, creatinine 1.0. WBC 10.9, hemoglobin 13.8, platelets 222. Most of these tests were taken in October 2002.  REVIEW OF SYSTEMS:  He also hurt some in his shoulders and it can bother him to lift it occasionally. His knees bother him "a little bit." His wrists, elbows, and ankles do not hurt. He denies fevers or significant weight loss. He feels his energy level is significantly lower over the last few  years. He has no active rashes except for bruises. He denies psoriasis, oral ulcers, alopecia, pleurisy, Raynauds, diarrhea, constipation, blood or mucus through the bowel movement. He occasionally has headaches but not frequently. He has had some type of chest pain but also he minimizes this. He denies back pain.  PAST MEDICAL/SURGICAL HISTORY:  Stomach ulcer, possible COPD. No hypertension or diabetes.  MEDICATIONS: 1. Prednisone 20 mg q.d. 2. Naproxen 500 mg b.i.d. 3. Flexeril 10 mg t.i.d. 4. Prevacid 30 mg q.d. 5. Viagra p.r.n.  DRUG INTOLERANCES:  DUST - sinus reaction.  FAMILY HISTORY:  Father died at age 83 and his mother died at age 55, and she had diabetes, hypertension, and history of a stroke.  SOCIAL HISTORY:  He is here today with one of his daughters. He has six children in all. He is married and lives with his wife. He is retired and he was a Advertising account planner. He completed the eighth grade. He began smoking at about age 33 years. He drank alcohol but says he quit in 1967.  PHYSICAL EXAMINATION:  VITAL SIGNS:  Weight 257 pounds, height 6 feet 1 inches. Blood pressure 120/88, respiratory rate 16.  GENERAL:  He is a big man who is no distress.  SKIN:  On the forearms he has the typical chronic changes of long-term steroid exposure. Skin is thin and there is some chronic bruising.  HEENT:  Moderately cloudy lenses. PERL/EOMI:  Mouth: Edentulous, clear.  NECK:  Negative JVD.  LUNGS:  Overall quite clear with trace rales to the bases.  HEART:  Regular. No murmur.  ABDOMEN:  Soft, nontender, obese.  LOWER EXTREMITIES:  2-3+ pitting edema around the ankles and lower extremity.  MUSCULOSKELETAL:  The hands have a strong thickened appearance. There is some swelling that seems to be bilateral across the MTPs but with fairly vigorous compression, they are nontender. The wrists were mildly tender but also have a chronically swollen appearance. The elbows extend  fully and had no nodules. The shoulders move moderately stiffly but through a good range of motion and there is no resistance. Neck:  Fair range of motion without tenderness. Back:  Nontender. Hips:  Good range of motion. The knees were bilaterally cool, nonswollen and nontender. The ankles had the edema and with compression they were tender. He was tender in the midfoot, but he was nontender across the MTPs. There was swelling to the dorsal part of the foot. Some arthritic swelling was hard to be determined.  NEUROLOGIC:  Strength 5/5. DTRs are suppressed at 1+ throughout. Negative SLRs.  ASSESSMENT: 1. Possible rheumatoid arthritis. Because of the amount of prednisone he has    been on, it is difficult to see swelling and appreciate arthritic changes    very well at this point. He very likely could have rheumatoid arthritis. I    will have both the hands and feet x-rayed. I would need to see either very    suggestive x-rays of rheumatoid arthritis or the swelling that also    suggests rheumatoid arthritis. Another scenario could be that he has had    gout. We will check a uric acid along with a CBC and CMET today. 2. Positive rheumatoid factor. It is well known that long-term heavy smokers    have a higher incidence of this test being positive. 3. Possible chronic obstructive pulmonary disease. I have discussed with    Mr. Saindon and his daughter that if his breathing starts to worsen because    we are lowering the prednisone, then this is the most important thing, and    he would increase the prednisone. Hopefully, we will not run into this. His    lab values, in terms of the PFTs and ABG, are fairly good. 4. History of stomach ulcer. He is on Prevacid.  PLAN:   Lower the prednisone to 15 mg a day for 10 days, then 10 mg q.d. for 10 days, then 5 mg a day until he returns. He will call if he is worsening.  Rosanne Ashing, I await the results of lowering the prednisone to see what his  arthritis  may do. Certainly if we lower the prednisone and there is no swelling, then he will remain off the prednisone as best we can. We may possibly find a diagnosis immediately of rheumatoid arthritis or possibly gout. He knows to call the office if he is worsening. He will return in five weeks. Thank you.  Sincerely, Dictated by:   Nathaneil Canary, M.D. Attending Physician:  Donnal Moat DD:  02/07/02 TD:  02/07/02 Job: [id number]

## 2011-05-13 NOTE — Discharge Summary (Signed)
NAME:  [name], [name]                          ACCOUNT NO.:  [id number]   MEDICAL RECORD NO.:  [id number]                   PATIENT TYPE:  INP   LOCATION:  5501                                 FACILITY:  [facility]   PHYSICIAN:  Casimiro Needle B. Sherene Sires, M.D. [facility]           DATE OF BIRTH:  01-05-1931   DATE OF ADMISSION:  10/08/2002  DATE OF DISCHARGE:  10/17/2002                                 DISCHARGE SUMMARY   FINAL DIAGNOSIS:  Probable acute on chronic pulmonary embolism secondary to  extensive left lower extremity deep venous thrombosis.   HOSPITAL COURSE:  Please see dictated H&P on 10/08/02.  This is an obese 77-  year-old white former smoker with COPD, status post ________in 1999, steroid  dependent rheumatoid arthritis and had just recently been discharged  following back surgery with a preoperative bicarb indicating the possibility  of obesity hypoventilation syndrome.  He presented to my office with  progressive dyspnea and leg swelling and workup indicated that he had  extensive left-sided DVT and bilateral pulmonary emboli.  His echocardiogram  was not optimal quality, but there was no overt evidence of right  ventricular abnormality.  The pulmonary artery pressure could not be  determined.   The patient  had an uneventful hospital course with the only issue being one  of anemia that was noted on admission and seemed to worsen slightly on  heparin.  However, his stool guaiacs remained negative.  His iron studies  revealed a saturation of 15% and he had borderline low indices. I therefore  initiated iron sulfate 325 t.i.d.   The other issue we addressed during the hospitalization was chronic pain  from his back.  He was seen by Dr. Franky Macho and by  Dr. Jeral Fruit who did not feel he had any neurosurgical reason to have pain and  we weaned him off of his high dose narcotics on to Darvocet with the use of  Ultram 50 mg q.i.d. and Clonidine 0.1 mg b.i.d. (added for hypertension  management), but also felt to have analgesic properties.   At the time of discharge, he was ambulatory and had been on heparin for full  eight days with two days of INR overlap on Coumadin.  He will therefore  be  discharged on Coumadin dosing per the pharmacy, prednisone 10 mg daily,  methotrexate 2.5 mg weekly, Guaifenesin DM two q.12h., Bactrim one Monday,  Wednesday, Friday prophylactically, Prevacid 30 mg one daily 30 minutes  before breakfast, folic acid 1 mg daily, Serevent Diskus one puff b.i.d.,  Ultram 50 mg q.i.d., iron sulfate 325 one t.i.d., Valium 5 mg one b.i.d.,  clonidine 0.1 mg one b.i.d.,  for pain he can use Darvocet one to two  q.4h.  For arthritic symptoms, he can use Vioxx 25 mg one to two daily.   He did desaturate nocturnally as documented on the last day of  hospitalization.  Therefore, will be treated  with two liters at bedtime with  formal sleep study to be arranged at a later date.   Followup will be in four days with repeat CBC and INR.   At the time of discharge he will be on a low salt, low fat diet and his  condition is markedly improved.                                               Charlaine Dalton. Sherene Sires, M.D. [facility]    MBW/MEDQ  D:  10/17/2002  T:  10/17/2002  Job:  [id number]   cc:   Aundra Dubin, M.D.  [address]  [place]  [place] [address]  Fax: [phone]

## 2011-05-13 NOTE — Discharge Summary (Signed)
NAMEECHO, [name]                ACCOUNT NO.:  [id number]   MEDICAL RECORD NO.:  [id number]          PATIENT TYPE:  INP   LOCATION:  3038                         FACILITY:  [facility]   PHYSICIAN:  Hilda Lias, M.D.   DATE OF BIRTH:  [birth year]   DATE OF ADMISSION:  09/10/2008  DATE OF DISCHARGE:  09/13/2008                               DISCHARGE SUMMARY   ADMISSION DIAGNOSIS:  L3-L4 stenosis.   FINAL DIAGNOSIS:  L3-L4 stenosis.   CLINICAL HISTORY:  The patient was admitted because of back pain racing  to both legs.  The patient had previous surgery with fusion of L4-L5.  X-  ray show severe stenosis at L3-L4.  Surgery was advised.   LABORATORY:  Normal.   COURSE OF THE HOSPITAL:  The patient was taken to surgery and bilateral  L3 laminectomy with posterolateral arthrodesis was done.  The patient  less than 48 hour was discharged by Dr. Tressie Stalker.   CONDITION AT DISCHARGE:  Improved.   MEDICATIONS:  1. Percocet.  2. Diazepam.   DIET:  Regular.   ACTIVITY:  Not to drive for at least 4 weeks.   FOLLOWUP:  He will be seen by me in 4 weeks.     ______________________________  Hilda Lias, M.D.    ______________________________  Hilda Lias, M.D.    EB/MEDQ  D:  10/14/2008  T:  10/14/2008  Job:  [id number]

## 2011-05-13 NOTE — Consult Note (Signed)
NAMEJOSEANGEL, NETTLETON                          ACCOUNT NO.:  [id number]   MEDICAL RECORD NO.:  [id number]                   PATIENT TYPE:   LOCATION:                                       FACILITY:   PHYSICIAN:  Aundra Dubin, M.D.            DATE OF BIRTH:  [birth year]   DATE OF CONSULTATION:  05/29/2003  DATE OF DISCHARGE:                                   CONSULTATION   CHIEF COMPLAINT:  Rheumatoid arthritis.   HISTORY OF PRESENT ILLNESS:  Mr. Fester was evaluated by Dr. Sherene Sires, and I  reviewed his note from 04/25/2003.  He is on Coumadin because of recurrent  pulmonary embolisms.  He has COPD.   His arthritis remains active in his hands and wrists.  He is staying on a  lower dose of prednisone at 10 mg a day.  His back is hurting across the low  back where he had surgery in September of 2003.  There is no radiating pain.  He feels that this is a chronic pain and has not changed acutely.  His  weight is down six pounds.  He remains short of breath and continues on his  home O2.  There has been no nausea or fever.   MEDICATIONS:  1. Methotrexate 25 mg every week.  2. Prednisone 10 mg daily.  3. Folic acid 1 mg daily.  4. Prevacid 30 mg daily.  5. Lasix 40 mg p.r.n.  6. Septra DS every Monday, Wednesday, and Friday.  7. Clonidine 0.1 mg daily.  8. Serevent Diskus.  9. O2 at 2 L.   PHYSICAL EXAMINATION:  VITAL SIGNS:  Weight 217 pounds, blood pressure  80/52, respirations 18.  GENERAL:  No distress.  He is on oxygen.  LUNGS:  Quite sounds with trace lower rales.  EXTREMITIES:  Lower extremities with 1-2+ edema.  HEART:  Regular.  MUSCULOSKELETAL:  He continues to have some synovitis.  He has mild  tenderness across the MCPs and wrists, elbows, and shoulders.  Good range of  motion.  The knees are cool and flex easily and nontender.  Ankles and feet  were nontender.  The back had tenderness across the lower lumbar area.   ASSESSMENT AND PLAN:  1. Rheumatoid arthritis.   He is fairly stable with the methotrexate at 25 mg     every week and prednisone 10 mg a day.  This will be continued.  Labs     checked on 03/06/2003 showed an albumin of 3.6, AST 29, and creatinine     0.9.  Hemoglobin 12.5, PLT 217, WBC 9.5.  We will check labs today.  2.     Back pain.  I have given him Darvocet to try with this, and he can use a     b.i.d. to t.i.d. dose.  3. Osteoporosis issues.  He is encouraged to continue on the calcium 600 mg  b.i.d.   FOLLOW UP:  He will return in three months.                                               Aundra Dubin, M.D.    WWT/MEDQ  D:  05/29/2003  T:  05/29/2003  Job:  [id number]   cc:   Dorise Hiss, M.D.  [address]  [place]  [place] [address]  Fax: [phone]

## 2011-05-16 ENCOUNTER — Ambulatory Visit
Admission: RE | Admit: 2011-05-16 | Discharge: 2011-05-16 | Disposition: A | Discharge status: Home / Self Care | Payer: Medicare PPO | Source: Ambulatory Visit | Attending: Neurosurgery | Admitting: Neurosurgery

## 2011-05-16 DIAGNOSIS — M549 Dorsalgia, unspecified: Secondary | ICD-10-CM

## 2011-05-16 DIAGNOSIS — M5410 Radiculopathy, site unspecified: Secondary | ICD-10-CM

## 2011-05-31 ENCOUNTER — Other Ambulatory Visit: Payer: Self-pay | Admitting: Neurosurgery

## 2011-05-31 DIAGNOSIS — M5410 Radiculopathy, site unspecified: Secondary | ICD-10-CM

## 2011-06-06 ENCOUNTER — Ambulatory Visit
Admission: RE | Admit: 2011-06-06 | Discharge: 2011-06-06 | Disposition: A | Discharge status: Home / Self Care | Payer: Medicare PPO | Source: Ambulatory Visit | Attending: Neurosurgery | Admitting: Neurosurgery

## 2011-06-06 DIAGNOSIS — M5410 Radiculopathy, site unspecified: Secondary | ICD-10-CM

## 2011-08-24 ENCOUNTER — Other Ambulatory Visit: Payer: Self-pay | Admitting: Cardiology

## 2011-09-15 ENCOUNTER — Encounter: Payer: Self-pay | Admitting: Cardiology

## 2011-09-26 LAB — PROTIME-INR
INR: 1.1
Prothrombin Time: 14.2

## 2011-09-26 LAB — COMPREHENSIVE METABOLIC PANEL
BUN: 8
CO2: 28
Calcium: 9.3
Creatinine, Ser: 0.96
GFR calc non Af Amer: >60
Glucose, Bld: 98
Total Protein: 6.9

## 2011-09-26 LAB — DIFFERENTIAL
Eosinophils Absolute: 0.5
Lymphocytes Relative: 15
Lymphs Abs: 1.1
Monocytes Relative: 11
Neutro Abs: 4.6
Neutrophils Relative %: 66

## 2011-09-26 LAB — CBC
Hemoglobin: 13.6
MCHC: 33.7
MCV: 101.5 — ABNORMAL HIGH
RBC: 3.98 — ABNORMAL LOW
RDW: 14.5

## 2011-10-27 ENCOUNTER — Ambulatory Visit: Payer: Medicare PPO | Admitting: Cardiology

## 2011-10-31 ENCOUNTER — Ambulatory Visit (HOSPITAL_COMMUNITY)
Admission: RE | Admit: 2011-10-31 | Discharge: 2011-10-31 | Disposition: A | Discharge status: Home / Self Care | Payer: Medicare PPO | Source: Ambulatory Visit | Attending: Family Medicine | Admitting: Family Medicine

## 2011-10-31 ENCOUNTER — Other Ambulatory Visit (HOSPITAL_COMMUNITY): Payer: Self-pay | Admitting: Family Medicine

## 2011-10-31 DIAGNOSIS — F17200 Nicotine dependence, unspecified, uncomplicated: Secondary | ICD-10-CM | POA: Insufficient documentation

## 2011-10-31 DIAGNOSIS — R609 Edema, unspecified: Secondary | ICD-10-CM

## 2011-10-31 DIAGNOSIS — E119 Type 2 diabetes mellitus without complications: Secondary | ICD-10-CM | POA: Insufficient documentation

## 2011-10-31 DIAGNOSIS — M7989 Other specified soft tissue disorders: Secondary | ICD-10-CM | POA: Insufficient documentation

## 2011-11-03 ENCOUNTER — Encounter: Payer: Self-pay | Admitting: Cardiology

## 2011-11-07 ENCOUNTER — Encounter: Payer: Self-pay | Admitting: Cardiology

## 2011-11-07 ENCOUNTER — Ambulatory Visit (INDEPENDENT_AMBULATORY_CARE_PROVIDER_SITE_OTHER): Payer: Medicare PPO | Admitting: Cardiology

## 2011-11-07 VITALS — BP 130/76 | HR 78 | Ht 73.0 in | Wt 233.0 lb

## 2011-11-07 DIAGNOSIS — J984 Other disorders of lung: Secondary | ICD-10-CM

## 2011-11-07 DIAGNOSIS — I2699 Other pulmonary embolism without acute cor pulmonale: Secondary | ICD-10-CM

## 2011-11-07 DIAGNOSIS — I4891 Unspecified atrial fibrillation: Secondary | ICD-10-CM

## 2011-11-07 DIAGNOSIS — I10 Essential (primary) hypertension: Secondary | ICD-10-CM

## 2011-11-07 NOTE — Patient Instructions (Signed)
Your physician wants you to follow-up in: 6 months. You will receive a reminder letter in the mail one-two months in advance. If you don't receive a letter, please call our office to schedule the follow-up appointment. Your physician recommends that you continue on your current medications as directed. Please refer to the Current Medication list given to you today. CT of Chest w/o contrast in April 2013.

## 2011-11-07 NOTE — Assessment & Plan Note (Signed)
In sinus rhythm today. Continue present course of therapy including Coumadin and Cardizem.

## 2011-11-07 NOTE — Progress Notes (Signed)
Clinical Summary Mr. Petrosian is a 77 y.o.male presenting for followup. He was seen in March.  Chest CT scan in April of this year demonstrated bilateral lung nodules with moderate concurrent emphysema, marked coronary artery atherosclerosis, subtle irregularity to the hepatic contour, and a right upper lobe mild interstitial opacity. Lingular nodule measured 9 mm, posterior lingular nodule measured 5 mm, probable subpleural lymph node measuring 4 mm, medial left lower lobe nodule decreased in size. Repeat imaging was recommended for 6-12 months. We've reviewed this today.  He reports no significant angina. Occasional sense of palpitations. No progressive shortness of breath, no cough or hemoptysis. Mainly complains of chronic back pain. He has also had lower extremity edema, left worse than right. States he had Dopplers that did not demonstrate DVT. Continues on chronic Coumadin.   Allergies  Allergen Reactions  . Procaine Hcl     REACTION: jerk    Medication list reviewed.  Past Medical History  Diagnosis Date  . Rheumatoid arthritis   . COPD (chronic obstructive pulmonary disease)   . Disseminated herpes zoster   . GERD (gastroesophageal reflux disease)   . Asthma   . Hypertension   . Pulmonary embolus     2003 and 2011  . Pulmonary nodules      Chest CT 09/11  . Mixed hyperlipidemia   . Atrial fibrillation     CHADS2 score 2  . Otitis externa, left     Past Surgical History  Procedure Date  . Cataract extraction   . Lumber fusion   . Bilateral l2 laminectomy     Social History Mr. Fussell reports that he quit smoking about 13 years ago. His smoking use included Cigarettes. He has a 57 pack-year smoking history. He has never used smokeless tobacco. Mr. Kingsley reports that he does not drink alcohol.  Review of Systems As outlined above. Otherwise negative.  Physical Examination Filed Vitals:   11/07/11 1519  BP: 130/76  Pulse: 78    Overweight, bearded male, no  acute distress.  HEENT: Conjunctiva and lids normal, oropharynx with poor dentition.  Neck: Supple, no elevated JVP or carotid bruits.  Lungs: Diminished, coarse breath sounds throughout, slight end expiratory wheeze.  Cardiac: Regular rate and rhythm, no S3.  Abdomen: Soft, nontender, bowel sounds present.  Extremities: No pitting edema.  Skin: Diffuse epidermal scarring status post disseminated zoster.  Musculoskeletal: No kyphosis.  Neuropsychiatric: Alert and oriented x3, affect appropriate.   ECG Reviewed in EMR.   Problem List and Plan

## 2011-11-07 NOTE — Assessment & Plan Note (Signed)
Stable pulmonary nodules by followup CT scan in April. Follow up examination scheduled for next April.

## 2011-11-07 NOTE — Assessment & Plan Note (Signed)
Blood pressure well-controlled today. 

## 2011-11-07 NOTE — Assessment & Plan Note (Signed)
Bilateral pulmonary emboli diagnosed in September 2011, prior event in 2003. Patient also has lupus anticoagulant. Anticipate long-term use of Coumadin.

## 2011-11-28 ENCOUNTER — Other Ambulatory Visit: Payer: Self-pay | Admitting: *Deleted

## 2011-11-28 DIAGNOSIS — I4891 Unspecified atrial fibrillation: Secondary | ICD-10-CM

## 2011-11-28 MED ORDER — DILTIAZEM HCL ER COATED BEADS 180 MG PO CP24: 180.0000 mg | ORAL_CAPSULE | Freq: Every day | ORAL | Status: DC

## 2011-11-29 ENCOUNTER — Telehealth: Payer: Self-pay | Admitting: *Deleted

## 2011-11-29 NOTE — Telephone Encounter (Signed)
Nurse explained to patient that we send rx request back within 24 hour window and that if his rx came Friday after clinic,we wouldn't have access to it until Monday. Nurse apologized for his frustration and that our office sent rx back in descent time frame. Nurse advised patient to check with pharmacy at 9:00am this morning to see if rx there, if its not, call is back and we will resend it.

## 2012-01-05 LAB — PROTIME-INR: INR: 2.4 — AB (ref 0.9–1.1)

## 2012-01-11 LAB — BASIC METABOLIC PANEL
%SAT: 6
BUN: 12 mg/dL (ref 4–21)
Creat: 1.17
Potassium: 4.5 mmol/L
Sodium: 140 mmol/L (ref 137–147)

## 2012-01-11 LAB — CBC WITH DIFFERENTIAL/PLATELET
HCT: 34 %
MCV: 92 fL
Vitamin B-12: 669
platelet count: 205

## 2012-01-18 ENCOUNTER — Encounter: Payer: Self-pay | Admitting: Gastroenterology

## 2012-01-18 ENCOUNTER — Ambulatory Visit (INDEPENDENT_AMBULATORY_CARE_PROVIDER_SITE_OTHER): Payer: Medicare PPO | Admitting: Gastroenterology

## 2012-01-18 ENCOUNTER — Telehealth: Payer: Self-pay | Admitting: Gastroenterology

## 2012-01-18 VITALS — BP 119/62 | HR 107 | Temp 98.4°F | Ht 73.0 in | Wt 230.0 lb

## 2012-01-18 DIAGNOSIS — R760 Raised antibody titer: Secondary | ICD-10-CM

## 2012-01-18 DIAGNOSIS — K5900 Constipation, unspecified: Secondary | ICD-10-CM | POA: Insufficient documentation

## 2012-01-18 DIAGNOSIS — K219 Gastro-esophageal reflux disease without esophagitis: Secondary | ICD-10-CM | POA: Insufficient documentation

## 2012-01-18 DIAGNOSIS — D509 Iron deficiency anemia, unspecified: Secondary | ICD-10-CM

## 2012-01-18 DIAGNOSIS — K7460 Unspecified cirrhosis of liver: Secondary | ICD-10-CM | POA: Insufficient documentation

## 2012-01-18 DIAGNOSIS — R894 Abnormal immunological findings in specimens from other organs, systems and tissues: Secondary | ICD-10-CM

## 2012-01-18 DIAGNOSIS — Z7901 Long term (current) use of anticoagulants: Secondary | ICD-10-CM

## 2012-01-18 HISTORY — DX: Iron deficiency anemia, unspecified: D50.9

## 2012-01-18 MED ORDER — SOD PICOSULFATE-MAG OX-CIT ACD 10-3.5-12 MG-GM-GM PO PACK: 1.0000 | PACK | Freq: Once | ORAL | Status: DC

## 2012-01-18 MED ORDER — POLYETHYLENE GLYCOL 3350 17 GM/SCOOP PO POWD: 17.0000 g | Freq: Every day | ORAL | Status: AC

## 2012-01-18 MED ORDER — ENOXAPARIN SODIUM 150 MG/ML ~~LOC~~ SOLN: 150.0000 mg | SUBCUTANEOUS | Status: DC

## 2012-01-18 NOTE — Assessment & Plan Note (Signed)
Upper liver seen on Chest CT, ?nodular liver. Prior h/o heavy etoh use. Abd u/s.

## 2012-01-18 NOTE — Assessment & Plan Note (Signed)
Chronic gerd. No prior egd. Need egd to r/o complicated GERD ie Barrett's as well as potential source for IDA. EGD with deep sedation.  I have discussed the risks, alternatives, benefits with regards to but not limited to the risk of reaction to medication, bleeding, infection, perforation and the patient is agreeable to proceed. Written consent to be obtained.

## 2012-01-18 NOTE — Progress Notes (Signed)
Primary Care Physician:  Josue Hector, MD, MD  Primary Gastroenterologist:  Roetta Sessions, MD   Chief Complaint  Patient presents with  . Colonoscopy  . Anemia    HPI:  [name] is a 78 y.o. male here to schedule colonoscopy. He has a history of iron deficiency anemia. He has never had a colonoscopy. He complains of chronic constipation. BM 2 per week. No melena. Rare brbpr. Stool dark couple of months ago. Waiting for call back from pain management doctor, Dr. Ollen Bowl regarding cymbalta. ?messed him up. No dysphagia, heartburn. Used to have bad heartburn. Used to take lot of rolaids. Problems more than five years. Rarely has stomach pain. No ASA/NSAIDS. Few years ago, used Aleve a lot.   Patient has a history of recurrent pulmonary emboli, A. Fib, lupus anticoagulant positive. He is on chronic Coumadin therapy.  Current Outpatient Prescriptions  Medication Sig Dispense Refill  . albuterol (PROAIR HFA) 108 (90 BASE) MCG/ACT inhaler Inhale 2 puffs into the lungs every 6 (six) hours as needed.        Marland Kitchen albuterol (PROVENTIL) (2.5 MG/3ML) 0.083% nebulizer solution Take 2.5 mg by nebulization every 6 (six) hours as needed.        . cetirizine (ZYRTEC) 10 MG tablet Take 10 mg by mouth daily.        . cloNIDine (CATAPRES) 0.1 MG tablet Take 0.1 mg by mouth 2 (two) times daily.        Marland Kitchen diltiazem (CARDIZEM CD) 180 MG 24 hr capsule Take 1 capsule (180 mg total) by mouth daily.  30 capsule  7  . Etanercept (ENBREL) 25 MG/0.5ML SOLN Inject 25 mg into the skin 2 (two) times a week.        . folic acid (FOLVITE) 1 MG tablet Take 1 mg by mouth daily.        . furosemide (LASIX) 40 MG tablet Take one by mouth daily         . gabapentin (NEURONTIN) 300 MG capsule Take 300 mg by mouth 3 (three) times daily.        Marland Kitchen HYDROcodone-acetaminophen (VICODIN) 5-500 MG per tablet Take 1 tablet by mouth 2 (two) times daily as needed.       . methotrexate (RHEUMATREX) 2.5 MG tablet Take 5 tablets by  mouth twice weekly       . metolazone (ZAROXOLYN) 5 MG tablet Take 5 mg by mouth daily.      . Omega-3 Fatty Acids (FISH OIL) 1000 MG CAPS Take one by mouth twice daily        . omeprazole (PRILOSEC) 20 MG capsule Take 20 mg by mouth 2 (two) times daily. Take one by mouth twice daily       . oxyCODONE-acetaminophen (PERCOCET) 10-325 MG per tablet Take 1 tablet by mouth every 4 (four) hours as needed.       . potassium chloride SA (K-DUR,KLOR-CON) 20 MEQ tablet Take 20 mEq by mouth 2 (two) times daily.        Marland Kitchen warfarin (COUMADIN) 2 MG tablet Take by mouth. 4 mg on Monday, Tuesday, Thursday, Friday and Sunday 6 mg on Wednesday and Staurday      .        .         . DULoxetine (CYMBALTA) 30 MG capsule Take 30 mg by mouth daily.        Allergies as of 01/18/2012 - Review Complete 01/18/2012  Allergen Reaction Noted  . Procaine hcl  Past Medical History  Diagnosis Date  . Rheumatoid arthritis   . COPD (chronic obstructive pulmonary disease)   . Disseminated herpes zoster 2010  . GERD (gastroesophageal reflux disease)   . Asthma   . Hypertension   . Pulmonary embolus     2003 and 2011  . Pulmonary nodules      Chest CT 09/11  . Mixed hyperlipidemia   . Atrial fibrillation     CHADS2 score 2  . Otitis externa, left   . Lupus anticoagulant positive      Past Surgical History  Procedure Date  . Cataract extraction     bilateral  . Lumber fusion    . Bilateral l2 laminectomy     three back surgery 2003/2009/2011  . Tube in ear     both ears      Family History  Problem Relation Age of Onset  . Colon cancer Neg Hx   . Liver disease Neg Hx     History   Social History  . Marital Status: Married    Spouse Name: N/A    Number of Children: 6  . Years of Education: N/A   Occupational History  . retired    Social History Main Topics  . Smoking status: Former Smoker -- 1.0 packs/day for 57 years    Types: Cigarettes    Quit date: 01/27/1998  . Smokeless  tobacco: Never Used  . Alcohol Use: No     quit at age 72  . Drug Use: No  . Sexually Active: None   Other Topics Concern  . None   Social History Narrative  . None      ROS:  General: Negative for anorexia, weight loss, fever, chills, fatigue, weakness. Eyes: Negative for vision changes.  ENT: Negative for hoarseness, difficulty swallowing , nasal congestion. CV: Negative for chest pain, angina, palpitations, dyspnea on exertion, peripheral edema.  Respiratory: Negative for dyspnea at rest, dyspnea on exertion, cough, sputum, wheezing.  GI: See history of present illness. GU:  Negative for dysuria, hematuria, urinary incontinence, urinary frequency, nocturnal urination.  MS: Chronic joint pain, low back pain.  Derm: Negative for rash or itching.  Neuro: Negative for weakness, abnormal sensation, seizure, frequent headaches, memory loss, confusion.  Psych: Negative for anxiety, depression, suicidal ideation, hallucinations.  Endo: Negative for unusual weight change.  Heme: Negative for bruising or bleeding. Allergy: Negative for rash or hives.    Physical Examination:  BP 119/62  Pulse 107  Temp(Src) 98.4 F (36.9 C) (Temporal)  Ht 6\' 1"  (1.854 m)  Wt 230 lb (104.327 kg)  BMI 30.34 kg/m2   General: Well-nourished, well-developed in no acute distress. Accompanied by wife.  Head: Normocephalic, atraumatic.   Eyes: Conjunctiva pink, no icterus. Mouth: Oropharyngeal mucosa moist and pink , no lesions erythema or exudate. No teeth. Neck: Supple without thyromegaly, masses, or lymphadenopathy.  Lungs: Clear to auscultation bilaterally.  Heart: Regular rate and rhythm, no murmurs rubs or gallops.  Abdomen: Bowel sounds are normal, nontender, nondistended, no hepatosplenomegaly or masses, no abdominal bruits, no rebound or guarding.  Ventral weakness.   Rectal: Defer to time of colonoscopy. Extremities: Trace lower extremity edema. No clubbing or deformities.  Neuro: Alert  and oriented x 4 , grossly normal neurologically.  Skin: Warm and dry, no rash or jaundice.   Psych: Alert and cooperative, normal mood and affect.  Labs: 01/10/2012 Iron 24, TIBC 347 ,iron saturation 6%, sodium 140, potassium 4.5, BUN 12, creatinine 1.17, calcium 9.2,  white blood cell count 6100, hemoglobin 10.2 (see 10.3 on January 9), hematocrit 33.7, MCV 92, platelets 200,000, eosinophil count 12%, B12 669. INR 2.37 on January 9.  Imaging Studies: No results found.

## 2012-01-18 NOTE — Assessment & Plan Note (Signed)
Add Miralax 17g daily prn.

## 2012-01-18 NOTE — Patient Instructions (Addendum)
We have scheduled you for an upper endoscopy and colonoscopy with Dr. Jena Gauss. Please see separate instructions. We have scheduled you for an abdominal ultrasound to further evaluate your liver. For constipation, you may use miralax 17g daily as needed. You can buy this over the counter.    Last dose of Coumadin on Sunday Feb 3rd. Take Lovenox 150mg  [place] daily at 8am starting on Feb 4th. Last dose of Lovenox on Feb 6th. You will receive further instructions after your procedures.

## 2012-01-18 NOTE — Progress Notes (Signed)
Faxed to PCP

## 2012-01-18 NOTE — Assessment & Plan Note (Addendum)
IDA in setting of chronic coumadin. Rare brbpr with chronic constipation. No prior colonoscopy. Patient on chronic pain medications. He will need Lovenox bridge for h/o recurrent PE and lupus anticoagulant positive. Given this we discussed deep sedation to ensure adequate sedation as his case is complicated as opposed to risking failed conscious sedation.   I have discussed the risks, alternatives, benefits with regards to but not limited to the risk of reaction to medication, bleeding, infection, perforation and the patient is agreeable to proceed. Written consent to be obtained.

## 2012-01-18 NOTE — Telephone Encounter (Signed)
LM W/ PTS SON WITH PRE OP DATE & TIME- 01/31/@9 :15

## 2012-01-20 ENCOUNTER — Ambulatory Visit (HOSPITAL_COMMUNITY)
Admission: RE | Admit: 2012-01-20 | Discharge: 2012-01-20 | Disposition: A | Discharge status: Home / Self Care | Payer: Medicare PPO | Source: Ambulatory Visit | Attending: Gastroenterology | Admitting: Gastroenterology

## 2012-01-20 DIAGNOSIS — R932 Abnormal findings on diagnostic imaging of liver and biliary tract: Secondary | ICD-10-CM | POA: Insufficient documentation

## 2012-01-20 DIAGNOSIS — K7460 Unspecified cirrhosis of liver: Secondary | ICD-10-CM

## 2012-01-20 DIAGNOSIS — R935 Abnormal findings on diagnostic imaging of other abdominal regions, including retroperitoneum: Secondary | ICD-10-CM | POA: Insufficient documentation

## 2012-01-20 DIAGNOSIS — R748 Abnormal levels of other serum enzymes: Secondary | ICD-10-CM | POA: Insufficient documentation

## 2012-01-20 NOTE — Progress Notes (Signed)
Quick Note:  Addressed at OV. Labs from Dr. Kellie Simmering. IDA. TCS/EGD planned. Patient has abnormal gb on u/s, ?malignancy. CT to be done. Also with cirrhosis, but appears compensated. Await CT prior to further recommendations regarding cirrhosis.   Patient with high EOS. ______

## 2012-01-20 NOTE — Progress Notes (Signed)
Quick Note:  Message from Dr. Jena Gauss: urgent situation regarding gb mass. Need CT. May need to move up endoscopic evaluation. Patient will likely need gb cancer operation rather than simple cholecystectomy and may need to have done at tertiary care center as risks go up with multiple comorbidities. ______

## 2012-01-20 NOTE — Progress Notes (Signed)
Quick Note:  Appreciate input from Dr. Jena Gauss. CT for first of week due to weather.  Await CT findings.  If need to move of endoscopic evaluation, still would need few days of preparation as patient has h/o lupus anticoagulant and recurrent PE therefore lovenox bridge planned. Will discuss with Dr. Jena Gauss. ______

## 2012-01-20 NOTE — Progress Notes (Signed)
Quick Note:  Spoke to Dr. Tyron Russell. Spoke to patient.  Patient has a mass within the gallbladder. Probable cirrhosis.   Please schedule CT Abdomen with/without contrast first of the week. Patient is expected phone call today or Monday.  Creatinine was 1.16 on Jan 9th 2013. ______

## 2012-01-23 ENCOUNTER — Encounter: Payer: Self-pay | Admitting: Gastroenterology

## 2012-01-23 ENCOUNTER — Other Ambulatory Visit: Payer: Self-pay | Admitting: Gastroenterology

## 2012-01-23 ENCOUNTER — Telehealth: Payer: Self-pay | Admitting: Gastroenterology

## 2012-01-23 DIAGNOSIS — K828 Other specified diseases of gallbladder: Secondary | ICD-10-CM

## 2012-01-23 MED ORDER — PEG 3350-KCL-NA BICARB-NACL 420 G PO SOLR: ORAL | Status: AC

## 2012-01-23 NOTE — Telephone Encounter (Signed)
PT CALLED ASKING IF WE COULD GIVE HIM A DIFFERENT PREP- HE CAN NOT AFFORD THE PREPOPIK- CHANGED TO TRILYTE AND FAXED TO HIS PHARMACY ( [facility]- [phone]) ALONG WITH NEW INSTRUCTIONS

## 2012-01-24 ENCOUNTER — Ambulatory Visit (HOSPITAL_COMMUNITY)
Admission: RE | Admit: 2012-01-24 | Discharge: 2012-01-24 | Disposition: A | Discharge status: Home / Self Care | Payer: Medicare PPO | Source: Ambulatory Visit | Attending: Gastroenterology | Admitting: Gastroenterology

## 2012-01-24 DIAGNOSIS — K828 Other specified diseases of gallbladder: Secondary | ICD-10-CM

## 2012-01-24 DIAGNOSIS — R932 Abnormal findings on diagnostic imaging of liver and biliary tract: Secondary | ICD-10-CM | POA: Insufficient documentation

## 2012-01-24 DIAGNOSIS — Q619 Cystic kidney disease, unspecified: Secondary | ICD-10-CM | POA: Insufficient documentation

## 2012-01-24 MED ORDER — IOHEXOL 300 MG/ML  SOLN
100.0000 mL | Freq: Once | INTRAMUSCULAR | Status: AC | PRN
  Administered 2012-01-24: 100 mL via INTRAVENOUS

## 2012-01-25 ENCOUNTER — Encounter (HOSPITAL_COMMUNITY): Payer: Self-pay | Admitting: Pharmacy Technician

## 2012-01-26 ENCOUNTER — Encounter (HOSPITAL_COMMUNITY)
Admission: RE | Admit: 2012-01-26 | Discharge: 2012-01-26 | Disposition: A | Discharge status: Home / Self Care | Payer: Medicare PPO | Source: Ambulatory Visit | Attending: Internal Medicine | Admitting: Internal Medicine

## 2012-01-26 ENCOUNTER — Encounter (HOSPITAL_COMMUNITY): Payer: Self-pay

## 2012-01-26 NOTE — Pre-Procedure Instructions (Signed)
Pt had I-Stat drawn on 01/11/12. Pt has PAT appt on 01/26/12 for TCS/EGD in OR and Dr. Jayme Cloud was notified and said it was okay to use the I-Stat results from 01/11/12. Per pt, he is allergic to all of the "caine" medications. Dr. Jena Gauss was notified and said to make sure anesthesia is aware, but that it would be okay bc we can use other methods. Dr. Jayme Cloud was then notified and is aware.

## 2012-01-26 NOTE — Patient Instructions (Addendum)
[address] S[address]VATO  01/26/2012   Your procedure is scheduled on:  02/02/2012  Report to [facility] at [phone] AM.  Call this number if you have problems the morning of surgery: [phone]   Remember:   Do not eat food:follow prep given by doctor.  May have clear liquids:until Midnight .  Clear liquids include soda, tea, black coffee, apple or grape juice, broth.  Take these medicines the morning of surgery with A SIP OF WATER: Cardizem, Clonidine, cetirizine, gabapentin, and omeprazole. Take hydrocodone only if needed. Also take your inhaler-albuterol and bring with you to the hospital.   Do not wear jewelry, make-up or nail polish.  Do not wear lotions, powders, or perfumes. You may wear deodorant.  Do not shave 48 hours prior to surgery.  Do not bring valuables to the hospital.  Contacts, dentures or bridgework may not be worn into surgery.  Leave suitcase in the car. After surgery it may be brought to your room.  For patients admitted to the hospital, checkout time is 11:00 AM the day of discharge.   Patients discharged the day of surgery will not be allowed to drive home.  Name and phone number of your driver: Family  Special Instructions: N/A   Please read over the following fact sheets that you were given: Pain Booklet, Anesthesia Post-op Instructions and Care and Recovery After Surgery    Esophagogastroduodenoscopy This is an endoscopic procedure (a procedure that uses a device like a flexible telescope) that allows your caregiver to view the upper stomach and small bowel. This test allows your caregiver to look at the esophagus. The esophagus carries food from your mouth to your stomach. They can also look at your duodenum. This is the first part of the small intestine that attaches to the stomach. This test is used to detect problems in the bowel such as ulcers and inflammation. PREPARATION FOR TEST Nothing to eat after midnight the day before the test. NORMAL FINDINGS Normal  esophagus, stomach, and duodenum. Ranges for normal findings may vary among different laboratories and hospitals. You should always check with your doctor after having lab work or other tests done to discuss the meaning of your test results and whether your values are considered within normal limits. MEANING OF TEST  Your caregiver will go over the test results with you and discuss the importance and meaning of your results, as well as treatment options and the need for additional tests if necessary. OBTAINING THE TEST RESULTS It is your responsibility to obtain your test results. Ask the lab or department performing the test when and how you will get your results. Document Released: 04/14/2005 Document Revised: 08/24/2011 Document Reviewed: 11/21/2008 [facility] Patient Information 2012 [place], [place]. Colonoscopy A colonoscopy is an exam to evaluate your entire colon. In this exam, your colon is cleansed. A long fiberoptic tube is inserted through your rectum and into your colon. The fiberoptic scope (endoscope) is a long bundle of enclosed and very flexible fibers. These fibers transmit light to the area examined and send images from that area to your caregiver. Discomfort is usually minimal. You may be given a drug to help you sleep (sedative) during or prior to the procedure. This exam helps to detect lumps (tumors), polyps, inflammation, and areas of bleeding. Your caregiver may also take a small piece of tissue (biopsy) that will be examined under a microscope. LET YOUR CAREGIVER KNOW ABOUT:   Allergies to food or medicine.   Medicines taken, including vitamins,  herbs, eyedrops, over-the-counter medicines, and creams.   Use of steroids (by mouth or creams).   Previous problems with anesthetics or numbing medicines.   History of bleeding problems or blood clots.   Previous surgery.   Other health problems, including diabetes and kidney problems.   Possibility of pregnancy, if this  applies.  BEFORE THE PROCEDURE   A clear liquid diet may be required for 2 days before the exam.   Ask your caregiver about changing or stopping your regular medications.   Liquid injections (enemas) or laxatives may be required.   A large amount of electrolyte solution may be given to you to drink over a short period of time. This solution is used to clean out your colon.   You should be present 60 minutes prior to your procedure or as directed by your caregiver.  AFTER THE PROCEDURE   If you received a sedative or pain relieving medication, you will need to arrange for someone to drive you home.   Occasionally, there is a little blood passed with the first bowel movement. Do not be concerned.  FINDING OUT THE RESULTS OF YOUR TEST Not all test results are available during your visit. If your test results are not back during the visit, make an appointment with your caregiver to find out the results. Do not assume everything is normal if you have not heard from your caregiver or the medical facility. It is important for you to follow up on all of your test results. HOME CARE INSTRUCTIONS   It is not unusual to pass moderate amounts of gas and experience mild abdominal cramping following the procedure. This is due to air being used to inflate your colon during the exam. Walking or a warm pack on your belly (abdomen) may help.   You may resume all normal meals and activities after sedatives and medicines have worn off.   Only take over-the-counter or prescription medicines for pain, discomfort, or fever as directed by your caregiver. Do not use aspirin or blood thinners if a biopsy was taken. Consult your caregiver for medicine usage if biopsies were taken.  SEEK IMMEDIATE MEDICAL CARE IF:   You have a fever.   You pass large blood clots or fill a toilet with blood following the procedure. This may also occur 10 to 14 days following the procedure. This is more likely if a biopsy was taken.    You develop abdominal pain that keeps getting worse and cannot be relieved with medicine.  Document Released: 12/09/2000 Document Revised: 08/24/2011 Document Reviewed: 07/24/2008 [facility] Patient Information 2012 [place], [place].  PATIENT INSTRUCTIONS POST-ANESTHESIA  IMMEDIATELY FOLLOWING SURGERY:  Do not drive or operate machinery for the first twenty four hours after surgery.  Do not make any important decisions for twenty four hours after surgery or while taking narcotic pain medications or sedatives.  If you develop intractable nausea and vomiting or a severe headache please notify your doctor immediately.  FOLLOW-UP:  Please make an appointment with your surgeon as instructed. You do not need to follow up with anesthesia unless specifically instructed to do so.  WOUND CARE INSTRUCTIONS (if applicable):  Keep a dry clean dressing on the anesthesia/puncture wound site if there is drainage.  Once the wound has quit draining you may leave it open to air.  Generally you should leave the bandage intact for twenty four hours unless there is drainage.  If the epidural site drains for more than 36-48 hours please call the anesthesia department.  QUESTIONS?:  Please feel free to call your physician or the hospital operator if you have any questions, and they will be happy to assist you.     [facility] Anest[facility] Department [address] [place] [phone]

## 2012-01-27 ENCOUNTER — Encounter: Payer: Self-pay | Admitting: Cardiology

## 2012-01-27 ENCOUNTER — Ambulatory Visit (INDEPENDENT_AMBULATORY_CARE_PROVIDER_SITE_OTHER): Payer: Medicare PPO | Admitting: Cardiology

## 2012-01-27 VITALS — BP 112/66 | HR 69 | Ht 73.0 in | Wt 232.0 lb

## 2012-01-27 DIAGNOSIS — I10 Essential (primary) hypertension: Secondary | ICD-10-CM

## 2012-01-27 DIAGNOSIS — R609 Edema, unspecified: Secondary | ICD-10-CM

## 2012-01-27 DIAGNOSIS — I2699 Other pulmonary embolism without acute cor pulmonale: Secondary | ICD-10-CM

## 2012-01-27 DIAGNOSIS — I4891 Unspecified atrial fibrillation: Secondary | ICD-10-CM

## 2012-01-27 NOTE — Assessment & Plan Note (Signed)
At this point I doubt that this is related to systolic dysfunction. He has had prior evidence of diastolic dysfunction which could be contributing, more likely has a component of cor pulmonale related to his chronic lung disease with right heart strain. Symptoms have improved after temporary use of metolazone, and he continues on Lasix. A followup 2-D echocardiogram will be obtained.

## 2012-01-27 NOTE — Assessment & Plan Note (Signed)
Blood pressure well-controlled today. 

## 2012-01-27 NOTE — Assessment & Plan Note (Signed)
Continues on chronic Coumadin with history of positive lupus anticoagulant. He states that he is going to undergo an elective colonoscopy, and is to have bridging with Lovenox while off Coumadin.

## 2012-01-27 NOTE — Patient Instructions (Signed)
   Echo If the results of your test are normal or stable, you will receive a letter.  If they are abnormal, the nurse will contact you by phone. Your physician wants you to follow up in: 6 months.  You will receive a reminder letter in the mail one-two months in advance.  If you don't receive a letter, please call our office to schedule the follow up appointment  

## 2012-01-27 NOTE — Progress Notes (Signed)
Clinical Summary [name] is a 78 y.o.male presenting for followup. He was seen back in November. He is referred back to the office by Dr. Lysbeth Galas for evaluation of possible congestive heart failure. He has had no change in his baseline shortness of breath, no chest pain, however did have some progressive foot and lower leg edema that required temporary treatment with metolazone. The symptoms have improved.  He otherwise reports compliance with his medications, including Lasix. Echocardiogram from September 2011 demonstrated vigorous LVEF of 70% with grade 1 diastolic dysfunction. Right ventricle was poorly visualized.   Allergies  Allergen Reactions  . Procaine Hcl     REACTION: jerk    Current Outpatient Prescriptions  Medication Sig Dispense Refill  . albuterol (PROAIR HFA) 108 (90 BASE) MCG/ACT inhaler Inhale 2 puffs into the lungs every 6 (six) hours as needed. Shortness of Breath      . albuterol (PROVENTIL) (2.5 MG/3ML) 0.083% nebulizer solution Take 2.5 mg by nebulization every 6 (six) hours as needed.        . cetirizine (ZYRTEC) 10 MG tablet Take 10 mg by mouth daily.        . cloNIDine (CATAPRES) 0.1 MG tablet Take 0.1 mg by mouth 2 (two) times daily.        Marland Kitchen diltiazem (CARDIZEM CD) 180 MG 24 hr capsule Take 1 capsule (180 mg total) by mouth daily.  30 capsule  7  . Etanercept (ENBREL) 25 MG/0.5ML SOLN Inject 25 mg into the skin 2 (two) times a week.        . folic acid (FOLVITE) 1 MG tablet Take 1 mg by mouth daily.       . furosemide (LASIX) 40 MG tablet Take 80 mg by mouth daily. In the morning & 1  By mouth at night      . gabapentin (NEURONTIN) 300 MG capsule Take 300 mg by mouth 3 (three) times daily.       Marland Kitchen HYDROcodone-acetaminophen (VICODIN) 5-500 MG per tablet Take 1 tablet by mouth 2 (two) times daily. Pain      . methotrexate (RHEUMATREX) 2.5 MG tablet Takes 5 tablets on Friday and Saturday.      . Omega-3 Fatty Acids (FISH OIL) 1000 MG CAPS Take one by mouth  twice daily        . omeprazole (PRILOSEC) 20 MG capsule Take 20 mg by mouth 2 (two) times daily.       Marland Kitchen oxyCODONE-acetaminophen (PERCOCET) 10-325 MG per tablet Take 1 tablet by mouth every 8 (eight) hours as needed.      . OXYGEN-HELIUM IN Inhale 2 L into the lungs at bedtime as needed.      . potassium chloride SA (K-DUR,KLOR-CON) 20 MEQ tablet Take 20 mEq by mouth 2 (two) times daily.        Marland Kitchen warfarin (COUMADIN) 2 MG tablet Take 4-6 mg by mouth daily. 4 mg on Monday, Tuesday, Thursday, Friday and Sunday 6 mg on Wednesday and Saturday      . Sod Picosulfate-Mag Ox-Cit Acd (PREPOPIK) 10-3.5-12 MG-GM-GM PACK Take 1 kit by mouth once.  1 each  0    Past Medical History  Diagnosis Date  . Rheumatoid arthritis   . COPD (chronic obstructive pulmonary disease)   . Disseminated herpes zoster 2010  . GERD (gastroesophageal reflux disease)   . Asthma   . Pulmonary embolus     20 03 and 2011  . Pulmonary nodules      Chest CT 09/11  .  Mixed hyperlipidemia   . Atrial fibrillation     CHADS2 score 2  . Otitis externa, left   . Lupus anticoagulant positive   . Hypertension     Past Surgical History  Procedure Date  . Cataract extraction     bilateral  . Lumber fusion    . Bilateral l2 laminectomy     three back surgery 2003/2009/2011  . Tube in ear     both ears    Family History  Problem Relation Age of Onset  . Colon cancer Neg Hx   . Liver disease Neg Hx     Social History [name] reports that he quit smoking about 14 years ago. His smoking use included Cigarettes. He has a 57 pack-year smoking history. He has never used smokeless tobacco. [name] reports that he does not drink alcohol.  Review of Systems Negative except as outlined above.  Physical Examination Filed Vitals:   01/27/12 1558  BP: 112/66  Pulse: 69   Overweight, bearded male, no acute distress.  HEENT: Conjunctiva and lids normal, oropharynx with poor dentition.  Neck: Supple, no elevated JVP  or carotid bruits.  Lungs: Diminished, coarse breath sounds throughout, slight end expiratory wheeze.  Cardiac: Regular rate and rhythm, no S3.  Abdomen: Soft, nontender, bowel sounds present.  Extremities: No pitting edema.  Skin: Diffuse epidermal scarring status post disseminated zoster.  Musculoskeletal: No kyphosis.  Neuropsychiatric: Alert and oriented x3, affect appropriate.   ECG Sinus rhythm at 68 with leftward axis, low voltage.   Problem List and Plan

## 2012-01-27 NOTE — Assessment & Plan Note (Signed)
In sinus rhythm today. Continue present course of therapy including Coumadin and Cardizem. 

## 2012-01-30 ENCOUNTER — Telehealth: Payer: Self-pay | Admitting: Gastroenterology

## 2012-01-30 NOTE — Telephone Encounter (Signed)
Made pt aware of new arrival and procedure times- case had to be moved due to two pediatric cases being added @ [facility]- pt is ok with this

## 2012-02-01 NOTE — Progress Notes (Signed)
Quick Note:  Please let patient know. Reviewed CT and abd u/s with radiologist Dr. Tyron Russell.  Gallbladder looks good on CT. ?findings on u/s was due to sludge (tumefactive).  Liver nodular, slightly. Per Dr. Tyron Russell, and discussed with Dr. Jena Gauss. Patient needs repeat abd u/s to reevaluate gallbladder. He could have had some mass-like sludge not showing up on CT or could of already passed it so they recommend looking again with another u/s. Please arrange. ______

## 2012-02-02 ENCOUNTER — Ambulatory Visit (HOSPITAL_COMMUNITY): Payer: Medicare PPO | Admitting: Anesthesiology

## 2012-02-02 ENCOUNTER — Ambulatory Visit (HOSPITAL_COMMUNITY)
Admission: RE | Admit: 2012-02-02 | Discharge: 2012-02-02 | Disposition: A | Discharge status: Home / Self Care | Payer: Medicare PPO | Source: Ambulatory Visit | Attending: Internal Medicine | Admitting: Internal Medicine

## 2012-02-02 ENCOUNTER — Encounter (HOSPITAL_COMMUNITY): Payer: Self-pay | Admitting: Anesthesiology

## 2012-02-02 ENCOUNTER — Other Ambulatory Visit: Payer: Self-pay | Admitting: Internal Medicine

## 2012-02-02 ENCOUNTER — Encounter (HOSPITAL_COMMUNITY)
Admission: RE | Disposition: A | Discharge status: Home / Self Care | Payer: Self-pay | Source: Ambulatory Visit | Attending: Internal Medicine

## 2012-02-02 ENCOUNTER — Encounter (HOSPITAL_COMMUNITY): Payer: Self-pay | Admitting: *Deleted

## 2012-02-02 DIAGNOSIS — K219 Gastro-esophageal reflux disease without esophagitis: Secondary | ICD-10-CM

## 2012-02-02 DIAGNOSIS — K449 Diaphragmatic hernia without obstruction or gangrene: Secondary | ICD-10-CM | POA: Insufficient documentation

## 2012-02-02 DIAGNOSIS — K5909 Other constipation: Secondary | ICD-10-CM | POA: Insufficient documentation

## 2012-02-02 DIAGNOSIS — J449 Chronic obstructive pulmonary disease, unspecified: Secondary | ICD-10-CM | POA: Insufficient documentation

## 2012-02-02 DIAGNOSIS — I4891 Unspecified atrial fibrillation: Secondary | ICD-10-CM | POA: Insufficient documentation

## 2012-02-02 DIAGNOSIS — D509 Iron deficiency anemia, unspecified: Secondary | ICD-10-CM

## 2012-02-02 DIAGNOSIS — K5730 Diverticulosis of large intestine without perforation or abscess without bleeding: Secondary | ICD-10-CM | POA: Insufficient documentation

## 2012-02-02 DIAGNOSIS — D126 Benign neoplasm of colon, unspecified: Secondary | ICD-10-CM

## 2012-02-02 DIAGNOSIS — Z79899 Other long term (current) drug therapy: Secondary | ICD-10-CM | POA: Insufficient documentation

## 2012-02-02 DIAGNOSIS — I10 Essential (primary) hypertension: Secondary | ICD-10-CM | POA: Insufficient documentation

## 2012-02-02 DIAGNOSIS — Z7901 Long term (current) use of anticoagulants: Secondary | ICD-10-CM | POA: Insufficient documentation

## 2012-02-02 DIAGNOSIS — J4489 Other specified chronic obstructive pulmonary disease: Secondary | ICD-10-CM | POA: Insufficient documentation

## 2012-02-02 HISTORY — PX: POLYPECTOMY: SHX5525

## 2012-02-02 HISTORY — PX: ESOPHAGOGASTRODUODENOSCOPY: SHX1529

## 2012-02-02 SURGERY — COLONOSCOPY WITH PROPOFOL
Anesthesia: Monitor Anesthesia Care

## 2012-02-02 MED ORDER — MIDAZOLAM HCL 5 MG/5ML IJ SOLN
INTRAMUSCULAR | Status: DC | PRN
  Administered 2012-02-02 (×2): 1 mg via INTRAVENOUS

## 2012-02-02 MED ORDER — BUTAMBEN-TETRACAINE-BENZOCAINE 2-2-14 % EX AERO
1.0000 | INHALATION_SPRAY | Freq: Once | CUTANEOUS | Status: AC
  Administered 2012-02-02: 1 via TOPICAL
  Filled 2012-02-02: qty 56

## 2012-02-02 MED ORDER — MIDAZOLAM HCL 2 MG/2ML IJ SOLN
INTRAMUSCULAR | Status: AC
  Filled 2012-02-02: qty 2

## 2012-02-02 MED ORDER — GLYCOPYRROLATE 0.2 MG/ML IJ SOLN
0.2000 mg | Freq: Once | INTRAMUSCULAR | Status: AC
  Administered 2012-02-02: 13:00:00 via INTRAVENOUS

## 2012-02-02 MED ORDER — DIPHENHYDRAMINE HCL 50 MG/ML IJ SOLN
INTRAMUSCULAR | Status: AC
  Administered 2012-02-02: 25 mg via INTRAVENOUS
  Filled 2012-02-02: qty 1

## 2012-02-02 MED ORDER — STERILE WATER FOR IRRIGATION IR SOLN
Status: DC | PRN
  Administered 2012-02-02: 14:00:00

## 2012-02-02 MED ORDER — PROPOFOL 10 MG/ML IV EMUL
INTRAVENOUS | Status: DC | PRN
  Administered 2012-02-02: 50 ug/kg/min via INTRAVENOUS

## 2012-02-02 MED ORDER — PROPOFOL 10 MG/ML IV EMUL
INTRAVENOUS | Status: AC
  Filled 2012-02-02: qty 20

## 2012-02-02 MED ORDER — LACTATED RINGERS IV SOLN
INTRAVENOUS | Status: DC | PRN
  Administered 2012-02-02 (×2): via INTRAVENOUS

## 2012-02-02 MED ORDER — LACTATED RINGERS IV SOLN
INTRAVENOUS | Status: DC
  Administered 2012-02-02: 13:00:00 via INTRAVENOUS

## 2012-02-02 MED ORDER — WATER FOR IRRIGATION, STERILE IR SOLN
Status: DC | PRN
  Administered 2012-02-02: 1000 mL via RECTAL

## 2012-02-02 MED ORDER — MIDAZOLAM HCL 2 MG/2ML IJ SOLN: 1.0000 mg | INTRAMUSCULAR | Status: DC | PRN

## 2012-02-02 MED ORDER — GLYCOPYRROLATE 0.2 MG/ML IJ SOLN
INTRAMUSCULAR | Status: AC
  Filled 2012-02-02: qty 1

## 2012-02-02 MED ORDER — ONDANSETRON HCL 4 MG/2ML IJ SOLN
INTRAMUSCULAR | Status: AC
  Administered 2012-02-02: 4 mg via INTRAVENOUS
  Filled 2012-02-02: qty 2

## 2012-02-02 MED ORDER — WARFARIN SODIUM 2 MG PO TABS: 4.0000 mg | ORAL_TABLET | Freq: Every day | ORAL | Status: DC

## 2012-02-02 MED ORDER — LIDOCAINE HCL (PF) 1 % IJ SOLN
INTRAMUSCULAR | Status: AC
  Filled 2012-02-02: qty 5

## 2012-02-02 MED ORDER — ONDANSETRON HCL 4 MG/2ML IJ SOLN
4.0000 mg | Freq: Once | INTRAMUSCULAR | Status: AC
  Administered 2012-02-02: 4 mg via INTRAVENOUS

## 2012-02-02 MED ORDER — DIPHENHYDRAMINE HCL 50 MG/ML IJ SOLN
25.0000 mg | Freq: Once | INTRAMUSCULAR | Status: AC
  Administered 2012-02-02: 25 mg via INTRAVENOUS

## 2012-02-02 MED ORDER — FENTANYL CITRATE 0.05 MG/ML IJ SOLN: 25.0000 ug | INTRAMUSCULAR | Status: DC | PRN

## 2012-02-02 MED ORDER — ONDANSETRON HCL 4 MG/2ML IJ SOLN: 4.0000 mg | Freq: Once | INTRAMUSCULAR | Status: DC | PRN

## 2012-02-02 SURGICAL SUPPLY — 26 items
BLOCK BITE 60FR ADLT L/F BLUE (MISCELLANEOUS) ×3 IMPLANT
DEVICE CLIP HEMOSTAT 235CM (CLIP) ×2 IMPLANT
ELECT REM PT RETURN 9FT ADLT (ELECTROSURGICAL)
ELECTRODE REM PT RTRN 9FT ADLT (ELECTROSURGICAL) IMPLANT
FCP BXJMBJMB 240X2.8X (CUTTING FORCEPS)
FLOOR PAD 36X40 (MISCELLANEOUS) ×3
FORCEP RJ3 GP 1.8X160 W-NEEDLE (CUTTING FORCEPS) ×3 IMPLANT
FORCEPS BIOP RAD 4 LRG CAP 4 (CUTTING FORCEPS) ×3 IMPLANT
FORCEPS BIOP RJ4 240 W/NDL (CUTTING FORCEPS)
FORCEPS BXJMBJMB 240X2.8X (CUTTING FORCEPS) IMPLANT
INJECTOR/SNARE I SNARE (MISCELLANEOUS) IMPLANT
LUBRICANT JELLY 4.5OZ STERILE (MISCELLANEOUS) IMPLANT
MANIFOLD NEPTUNE II (INSTRUMENTS) ×1 IMPLANT
MANIFOLD NEPTUNE WASTE (CANNULA) ×3 IMPLANT
NDL SCLEROTHERAPY 25GX240 (NEEDLE) ×2 IMPLANT
NEEDLE SCLEROTHERAPY 25GX240 (NEEDLE) IMPLANT
PAD FLOOR 36X40 (MISCELLANEOUS) ×2 IMPLANT
PROBE APC STR FIRE (PROBE) ×2 IMPLANT
PROBE INJECTION GOLD (MISCELLANEOUS)
PROBE INJECTION GOLD 7FR (MISCELLANEOUS) ×2 IMPLANT
SNARE ROTATE MED OVAL 20MM (MISCELLANEOUS) ×3 IMPLANT
SYR 50ML LL SCALE MARK (SYRINGE) ×1 IMPLANT
TRAP SPECIMEN MUCOUS 40CC (MISCELLANEOUS) IMPLANT
TUBING ENDO SMARTCAP PENTAX (MISCELLANEOUS) ×3 IMPLANT
TUBING IRRIGATION ENDOGATOR (MISCELLANEOUS) ×3 IMPLANT
WATER STERILE IRR 1000ML POUR (IV SOLUTION) ×2 IMPLANT

## 2012-02-02 NOTE — Op Note (Signed)
[facility] [address] [place], [place]  [address]  COLONOSCOPY PROCEDURE REPORT  PATIENT:  [name], [name]  MR#:  [id number] BIRTHDATE:  1931/08/17, 80 yrs. old  GENDER:  male ENDOSCOPIST:  R. Roetta Sessions, MD FACP [facility] REF. BY:  Joette Catching, M.D. PROCEDURE DATE:  02/02/2012 PROCEDURE:  Colonoscopy with biopsy and multiple posterior polypectomy is  INDICATIONS:  Iron deficiency anemia; first ever colonoscopy; see EGD report  INFORMED CONSENT:  The risks, benefits, alternatives and imponderables including but not limited to bleeding, perforation as well as the possibility of a missed lesion have been reviewed. The potential for biopsy, lesion removal, etc. have also been discussed.  Questions have been answered.  All parties agreeable. Please see the history and physical in the medical record for more information.  MEDICATIONS:  Deep sedation per Dr. [facility]  DESCRIPTION OF PROCEDURE:  After a digital rectal exam was performed, the  colonoscope was advanced from the anus through the rectum and colon to the area of the cecum, ileocecal valve and appendiceal orifice.  The cecum was deeply intubated.  These structures were well-seen and photographed for the record.  From the level of the cecum and ileocecal valve, the scope was slowly and cautiously withdrawn.  The mucosal surfaces were carefully surveyed utilizing scope tip deflection to facilitate fold flattening as needed.  The scope was pulled down into the rectum where a thorough examination including retroflexion was performed. <<PROCEDUREIMAGES>>  FINDINGS: Marginal preparation. Minimal internal hemorrhoids; otherwise, normal rectum. Scattered left-sided diverticula multiple polyps in the descending and a standing segments. Small cecal polyps. The patient had multiple flat adenomatous appearing polyps in the mid descending colon.  THERAPEUTIC / DIAGNOSTIC MANEUVERS PERFORMED:  Small cecal  polyp cold biopsy/removed. The flat ascending colon polyps were removed with multiple passes of the hot snare utilizing piecemeal technique.  One area of a polypectomy wanted to use. In spite of being touched with the tip of the hot snare cautery loop. Ultimately, to resolution clips were placed with excellent hemostasis being achieved.  COMPLICATIONS:  None  CECAL WITHDRAWAL TIME:    27 minutes  IMPRESSION:   Multiple colonic polyps removed as described above. Left-sided diverticulosis.  RECOMMENDATIONS:   Followup on pathology. Will not bridge with Lovenox following the procedure (bleeding risk is prohibitive). Patient is         to resume Coumadin tomorrow for a slower steady rise in INR No MRI in the future until the clips are known to have passed..  ______________________________ R. Roetta Sessions, MD Caleen Essex  CC:  Joette Catching, M.D.  n. eSIGNED:   R. Roetta Sessions at 02/02/2012 02:21 PM  Caprice Kluver, [phone]

## 2012-02-02 NOTE — Progress Notes (Signed)
From OR. [place]. Oriented to place per nurse. Swallowing without difficulty. Passing air. Denies postop pain.

## 2012-02-02 NOTE — Preoperative (Signed)
Beta Blockers   Reason not to administer Beta Blockers:Not Applicable 

## 2012-02-02 NOTE — Transfer of Care (Signed)
Immediate Anesthesia Transfer of Care Note  Patient: [name]  Procedure(s) Performed:  COLONOSCOPY WITH PROPOFOL - start @ 1316, In cecum @ 1328; ESOPHAGOGASTRODUODENOSCOPY (EGD) WITH PROPOFOL - start @ 1306, end @ 1312; POLYPECTOMY  Patient Location: PACU  Anesthesia Type: MAC  Level of Consciousness: awake  Airway & Oxygen Therapy: Patient Spontanous Breathing. Non rebreather  Post-op Assessment: Report given to PACU RN, Post -op Vital signs reviewed and stable and Patient moving all extremities  Post vital signs: Reviewed and stable  Complications: No apparent anesthesia complications

## 2012-02-02 NOTE — Anesthesia Preprocedure Evaluation (Addendum)
Anesthesia Evaluation  Patient identified by MRN, date of birth, ID band Patient awake    Reviewed: Allergy & Precautions, H&P , NPO status , Patient's Chart, lab work & pertinent test results  History of Anesthesia Complications Negative for: history of anesthetic complications  Airway Mallampati: II      Dental  (+) Edentulous Upper and Edentulous Lower   Pulmonary COPDCurrent Smoker,    Pulmonary exam normal       Cardiovascular hypertension, Pt. on medications + dysrhythmias Atrial Fibrillation Regular Normal    Neuro/Psych    GI/Hepatic GERD-  ,(+) Cirrhosis -       ,   Endo/Other    Renal/GU      Musculoskeletal  (+) Arthritis -, Osteoarthritis,    Abdominal   Peds  Hematology   Anesthesia Other Findings   Reproductive/Obstetrics                           Anesthesia Physical Anesthesia Plan  ASA: III  Anesthesia Plan: MAC   Post-op Pain Management:    Induction: Intravenous  Airway Management Planned: Nasal Cannula  Additional Equipment:   Intra-op Plan:   Post-operative Plan:   Informed Consent: I have reviewed the patients History and Physical, chart, labs and discussed the procedure including the risks, benefits and alternatives for the proposed anesthesia with the patient or authorized representative who has indicated his/her understanding and acceptance.     Plan Discussed with:   Anesthesia Plan Comments:         Anesthesia Quick Evaluation

## 2012-02-02 NOTE — Op Note (Signed)
[facility] [address] [place], [place]  [address]  ENDOSCOPY PROCEDURE REPORT  PATIENT:  [name], [name]  MR#:  [id number] BIRTHDATE:  19-Nov-1931, 80 yrs. old  GENDER:  male  ENDOSCOPIST:  R. Roetta Sessions, MD Caleen Essex Referred by:  Joette Catching, M.D.  PROCEDURE DATE:  02/02/2012 PROCEDURE:  Diagnostic EGD  Indications:   long-standing GERD; iron deficiency anemia; history of cirrhosis  INFORMED CONSENT:   The risks, benefits, limitations, alternatives and imponderables have been discussed.  The potential for biopsy, esophogeal dilation, etc. have also been reviewed.  Questions have been answered.  All parties agreeable.  Please see the history and physical in the medical record for more information.  MEDICATIONS:    Deep sedation per Dr. [facility]  DESCRIPTION OF PROCEDURE:   The  endoscope was introduced through the mouth and advanced to the second portion of the duodenum without difficulty or limitations.  The mucosal surfaces were surveyed very carefully during advancement of the scope and upon withdrawal.  Retroflexion view of the proximal stomach and esophagogastric junction was performed.  <<PROCEDUREIMAGES>>  FINDINGS:   Normal esophagus. Moderate size hiatal hernia. Gastric mucosa normal. First and second portion of the duodenum appeared normal  THERAPEUTIC / DIAGNOSTIC MANEUVERS PERFORMED: None  COMPLICATIONS:   None  IMPRESSION:   Moderate size hiatal hernia; otherwise normal exam  RECOMMENDATIONS:   See colonoscopy report  ______________________________ R. Roetta Sessions, MD Caleen Essex  CC:  n. eSIGNED:   R. Roetta Sessions at 02/02/2012 02:09 PM  Caprice Kluver, [phone]

## 2012-02-02 NOTE — Anesthesia Postprocedure Evaluation (Signed)
Anesthesia Post Note  Patient: [name]  Procedure(s) Performed:  COLONOSCOPY WITH PROPOFOL - start @ 1316, In cecum @ 1328; ESOPHAGOGASTRODUODENOSCOPY (EGD) WITH PROPOFOL - start @ 1306, end @ 1312; POLYPECTOMY  Anesthesia type: MAC  Patient location: PACU  Post pain: Pain level controlled  Post assessment: Post-op Vital signs reviewed, Patient's Cardiovascular Status Stable, Respiratory Function Stable, Patent Airway, No signs of Nausea or vomiting and Pain level controlled  Last Vitals:  Filed Vitals:   02/02/12 1407  BP: 138/68  Pulse: 68  Temp: 36.6 C  Resp: 14    Post vital signs: Reviewed and stable  Level of consciousness: awake and alert   Complications: No apparent anesthesia complications

## 2012-02-02 NOTE — H&P (Signed)
  I have seen & examined the patient prior to the procedure(s) today and reviewed the history and physical/consultation.  There have been no changes.  After consideration of the risks, benefits, alternatives and imponderables, the patient has consented to the procedure(s).   

## 2012-02-03 ENCOUNTER — Other Ambulatory Visit: Payer: Self-pay | Admitting: Gastroenterology

## 2012-02-03 DIAGNOSIS — R9389 Abnormal findings on diagnostic imaging of other specified body structures: Secondary | ICD-10-CM

## 2012-02-03 DIAGNOSIS — R932 Abnormal findings on diagnostic imaging of liver and biliary tract: Secondary | ICD-10-CM

## 2012-02-06 ENCOUNTER — Encounter (HOSPITAL_COMMUNITY): Payer: Self-pay | Admitting: Internal Medicine

## 2012-02-07 ENCOUNTER — Encounter: Payer: Self-pay | Admitting: Internal Medicine

## 2012-02-08 NOTE — Progress Notes (Signed)
Quick Note:  Tried to call pt- LMOM ______ 

## 2012-02-08 NOTE — Progress Notes (Signed)
Quick Note:  Pt aware, he has appt for U/S tomorrow. ______

## 2012-02-09 ENCOUNTER — Ambulatory Visit (HOSPITAL_COMMUNITY)
Admission: RE | Admit: 2012-02-09 | Discharge: 2012-02-09 | Disposition: A | Discharge status: Home / Self Care | Payer: Medicare PPO | Source: Ambulatory Visit | Attending: Gastroenterology | Admitting: Gastroenterology

## 2012-02-09 DIAGNOSIS — K838 Other specified diseases of biliary tract: Secondary | ICD-10-CM | POA: Insufficient documentation

## 2012-02-09 DIAGNOSIS — R9389 Abnormal findings on diagnostic imaging of other specified body structures: Secondary | ICD-10-CM

## 2012-02-09 DIAGNOSIS — R932 Abnormal findings on diagnostic imaging of liver and biliary tract: Secondary | ICD-10-CM | POA: Insufficient documentation

## 2012-02-15 ENCOUNTER — Other Ambulatory Visit (INDEPENDENT_AMBULATORY_CARE_PROVIDER_SITE_OTHER): Payer: Medicare PPO

## 2012-02-15 ENCOUNTER — Other Ambulatory Visit: Payer: Self-pay

## 2012-02-15 ENCOUNTER — Other Ambulatory Visit (HOSPITAL_COMMUNITY): Payer: Self-pay | Admitting: Family Medicine

## 2012-02-15 ENCOUNTER — Telehealth: Payer: Self-pay | Admitting: *Deleted

## 2012-02-15 DIAGNOSIS — R51 Headache: Secondary | ICD-10-CM

## 2012-02-15 DIAGNOSIS — I4891 Unspecified atrial fibrillation: Secondary | ICD-10-CM

## 2012-02-15 DIAGNOSIS — R609 Edema, unspecified: Secondary | ICD-10-CM

## 2012-02-15 NOTE — Telephone Encounter (Signed)
Pt notified of results and verbalized understanding  

## 2012-02-15 NOTE — Telephone Encounter (Signed)
Message copied by Arlyss Gandy on Wed Feb 15, 2012  3:43 PM ------      Message from: MCDOWELL, Illene Bolus      Created: Wed Feb 15, 2012 12:40 PM       Reviewed report. LVEF remains normal, 60-65%. There is grade 2 diastolic dysfunction. Mild tricuspid regurgitation, no definite pulmonary hypertension.

## 2012-02-16 ENCOUNTER — Ambulatory Visit (HOSPITAL_COMMUNITY)
Admission: RE | Admit: 2012-02-16 | Discharge: 2012-02-16 | Disposition: A | Discharge status: Home / Self Care | Payer: Medicare PPO | Source: Ambulatory Visit | Attending: Family Medicine | Admitting: Family Medicine

## 2012-02-16 DIAGNOSIS — R51 Headache: Secondary | ICD-10-CM

## 2012-02-17 ENCOUNTER — Telehealth: Payer: Self-pay | Admitting: Gastroenterology

## 2012-02-17 DIAGNOSIS — K7460 Unspecified cirrhosis of liver: Secondary | ICD-10-CM

## 2012-02-17 NOTE — Progress Notes (Signed)
Quick Note:  SORRY I FORGOT THIS GUY HAD CLIPS PLACED AT TIME OF TCS. HE CANNOT HAVE AN MRI YET. LET ME TALK WITH RMR ONCE MORE BEFORE CONTACTING PATIENT. THANKS. ______

## 2012-02-17 NOTE — Telephone Encounter (Signed)
SORRY I FORGOT THIS GUY HAD CLIPS PLACED AT TIME OF TCS. HE CANNOT HAVE AN MRI YET. LET ME TALK WITH RMR ONCE MORE BEFORE CONTACTING PATIENT. THANKS.  Needs baseline AFP tumor marker too.

## 2012-02-17 NOTE — Progress Notes (Signed)
Quick Note:  SORRY I FORGOT THIS GUY HAD CLIPS PLACED AT TIME OF TCS. HE CANNOT HAVE AN MRI YET. LET ME TALK WITH RMR ONCE MORE BEFORE CONTACTING PATIENT. THANKS.  He also needs baseline AFP. ______

## 2012-02-17 NOTE — Progress Notes (Signed)
Quick Note:  Patient also needs baseline AFP tumor marker. ______

## 2012-02-17 NOTE — Progress Notes (Signed)
Quick Note:  Please tell patient.  Discussed with Dr. Jena Gauss.  RMR recommends one last imaging test to make sure no gallbladder mass. Likely complex sludge in gb but to be sure, he should have MRI. If patient agreeable, please schedule MRI abd with/without contrast. If gb still looks "funny", he may end up with gb surgery.  OV with RMR after MRI to discuss results and next step. Also need to f/u IDA (put in comments for ov, f/u mri and IDA) ______

## 2012-02-22 NOTE — Telephone Encounter (Signed)
Lab order faxed to lab. 

## 2012-02-22 NOTE — Telephone Encounter (Signed)
Ok, will wait to you speak with RMR

## 2012-02-23 NOTE — Telephone Encounter (Signed)
Tried to call pt- LMOM 

## 2012-02-23 NOTE — Telephone Encounter (Signed)
Per RMR. Wait three weeks and do a plain abdominal film to see if clips have passed.  If clips have passed then we will proceed as discussed before with one last imaging via MRI with possible gb surgery/liver bx.      Please let patient know what is going on. See abd u/s result note.

## 2012-02-24 ENCOUNTER — Other Ambulatory Visit: Payer: Self-pay | Admitting: Gastroenterology

## 2012-02-24 NOTE — Telephone Encounter (Signed)
Spoke with pt- advised him of the plan- he is in agreement- order put in for x-ray

## 2012-02-29 ENCOUNTER — Other Ambulatory Visit: Payer: Self-pay | Admitting: Gastroenterology

## 2012-02-29 DIAGNOSIS — IMO0002 Reserved for concepts with insufficient information to code with codable children: Secondary | ICD-10-CM

## 2012-03-13 NOTE — Telephone Encounter (Signed)
Quick Note:  AFP normal. We are in process of waiting for plain abd films prior to MRI and f/u OV with RMR only. ______

## 2012-03-14 ENCOUNTER — Ambulatory Visit (HOSPITAL_COMMUNITY)
Admission: RE | Admit: 2012-03-14 | Discharge: 2012-03-14 | Disposition: A | Discharge status: Home / Self Care | Payer: Medicare PPO | Source: Ambulatory Visit | Attending: Gastroenterology | Admitting: Gastroenterology

## 2012-03-14 DIAGNOSIS — IMO0002 Reserved for concepts with insufficient information to code with codable children: Secondary | ICD-10-CM

## 2012-03-14 DIAGNOSIS — T1490XA Injury, unspecified, initial encounter: Secondary | ICD-10-CM | POA: Insufficient documentation

## 2012-03-14 DIAGNOSIS — X58XXXA Exposure to other specified factors, initial encounter: Secondary | ICD-10-CM | POA: Insufficient documentation

## 2012-03-14 NOTE — Telephone Encounter (Signed)
Quick Note:  Tried to call pt- NA ______ 

## 2012-03-20 NOTE — Telephone Encounter (Signed)
Quick Note:  Mailed letter to pt ______ 

## 2012-03-20 NOTE — Telephone Encounter (Signed)
Quick Note:  Tried to call pt- NA ______ 

## 2012-03-22 NOTE — Progress Notes (Signed)
Quick Note:  Clips have past. Proceed with MRI abd with/without contrast to image gallbladder one more time. Likely complex sludge rather than mass.    If gb still looks "funny", he may end up with gb surgery +/- liver bx  OV with RMR after MRI to discuss results and next step. Also need to f/u IDA (put in comments for ov, f/u mri and IDA) ______

## 2012-03-28 ENCOUNTER — Other Ambulatory Visit: Payer: Self-pay | Admitting: Gastroenterology

## 2012-03-28 DIAGNOSIS — R932 Abnormal findings on diagnostic imaging of liver and biliary tract: Secondary | ICD-10-CM

## 2012-03-28 NOTE — Progress Notes (Signed)
Quick Note:  Pt aware, ok to set up MRI ______

## 2012-04-02 ENCOUNTER — Encounter: Payer: Self-pay | Admitting: Gastroenterology

## 2012-04-05 ENCOUNTER — Other Ambulatory Visit: Payer: Self-pay | Admitting: Gastroenterology

## 2012-04-05 DIAGNOSIS — R932 Abnormal findings on diagnostic imaging of liver and biliary tract: Secondary | ICD-10-CM

## 2012-04-11 ENCOUNTER — Other Ambulatory Visit: Payer: Self-pay | Admitting: Gastroenterology

## 2012-04-12 ENCOUNTER — Ambulatory Visit (HOSPITAL_COMMUNITY)
Admission: RE | Admit: 2012-04-12 | Discharge: 2012-04-12 | Disposition: A | Discharge status: Home / Self Care | Payer: Medicare PPO | Source: Ambulatory Visit | Attending: Gastroenterology | Admitting: Gastroenterology

## 2012-04-12 ENCOUNTER — Emergency Department (HOSPITAL_COMMUNITY): Payer: No Typology Code available for payment source

## 2012-04-12 ENCOUNTER — Encounter (HOSPITAL_COMMUNITY): Payer: Self-pay | Admitting: *Deleted

## 2012-04-12 ENCOUNTER — Emergency Department (HOSPITAL_COMMUNITY)
Admission: EM | Admit: 2012-04-12 | Discharge: 2012-04-12 | Disposition: A | Discharge status: Home / Self Care | Payer: No Typology Code available for payment source | Attending: Emergency Medicine | Admitting: Emergency Medicine

## 2012-04-12 DIAGNOSIS — K838 Other specified diseases of biliary tract: Secondary | ICD-10-CM | POA: Insufficient documentation

## 2012-04-12 DIAGNOSIS — Z8739 Personal history of other diseases of the musculoskeletal system and connective tissue: Secondary | ICD-10-CM | POA: Insufficient documentation

## 2012-04-12 DIAGNOSIS — Z79899 Other long term (current) drug therapy: Secondary | ICD-10-CM | POA: Insufficient documentation

## 2012-04-12 DIAGNOSIS — K8020 Calculus of gallbladder without cholecystitis without obstruction: Secondary | ICD-10-CM | POA: Insufficient documentation

## 2012-04-12 DIAGNOSIS — J449 Chronic obstructive pulmonary disease, unspecified: Secondary | ICD-10-CM | POA: Insufficient documentation

## 2012-04-12 DIAGNOSIS — R932 Abnormal findings on diagnostic imaging of liver and biliary tract: Secondary | ICD-10-CM | POA: Insufficient documentation

## 2012-04-12 DIAGNOSIS — J4489 Other specified chronic obstructive pulmonary disease: Secondary | ICD-10-CM | POA: Insufficient documentation

## 2012-04-12 DIAGNOSIS — S3992XA Unspecified injury of lower back, initial encounter: Secondary | ICD-10-CM

## 2012-04-12 DIAGNOSIS — M545 Low back pain, unspecified: Secondary | ICD-10-CM | POA: Insufficient documentation

## 2012-04-12 DIAGNOSIS — I10 Essential (primary) hypertension: Secondary | ICD-10-CM | POA: Insufficient documentation

## 2012-04-12 DIAGNOSIS — I4891 Unspecified atrial fibrillation: Secondary | ICD-10-CM | POA: Insufficient documentation

## 2012-04-12 MED ORDER — GADOBENATE DIMEGLUMINE 529 MG/ML IV SOLN
19.0000 mL | Freq: Once | INTRAVENOUS | Status: AC | PRN
  Administered 2012-04-12: 19 mL via INTRAVENOUS
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
## 2012-04-12 NOTE — ED Notes (Signed)
Patient is also being worked up for gallbladder problems
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
## 2012-04-18 NOTE — Progress Notes (Signed)
Quick Note:  Tumefactive sludge, small gallstones in gallbladder neck. ?pancreatic divisum. Will discuss with Dr. Jena Gauss and see if he can review film with radiologist.  Patient without abdominal pain when seen last in office. Incidental findings with w/u of IDA. ______

## 2012-04-19 NOTE — Progress Notes (Signed)
Quick Note:  Discussed with Dr. Jena Gauss. He reviewed films with radiologist. Patient has congenital abnormality of pancreas, pancreas divisum. No gallbladder tumor but very thick sludge and stones.   Recommend surgical consultation to see if patient should get his gallbladder out. If he has surgery, consider liver biopsy at same time for cirrhosis, up to surgeon's discretion but put on referral request. Please make arrangements with MD at [facility].   Please find out if patient has had any further CBC, iron, ferritin since 12/2011. Obtain copy, if not available, let me know. ______

## 2012-04-24 ENCOUNTER — Encounter: Payer: Self-pay | Admitting: Gastroenterology

## 2012-05-15 ENCOUNTER — Encounter (INDEPENDENT_AMBULATORY_CARE_PROVIDER_SITE_OTHER): Payer: Self-pay | Admitting: Surgery

## 2012-05-15 ENCOUNTER — Ambulatory Visit (INDEPENDENT_AMBULATORY_CARE_PROVIDER_SITE_OTHER): Payer: Medicare PPO | Admitting: Surgery

## 2012-05-15 ENCOUNTER — Encounter (INDEPENDENT_AMBULATORY_CARE_PROVIDER_SITE_OTHER): Payer: Self-pay | Admitting: General Surgery

## 2012-05-15 VITALS — BP 124/86 | HR 84 | Temp 98.6°F | Resp 18 | Ht 73.0 in | Wt 215.4 lb

## 2012-05-15 DIAGNOSIS — K8020 Calculus of gallbladder without cholecystitis without obstruction: Secondary | ICD-10-CM

## 2012-05-15 NOTE — Progress Notes (Signed)
Patient ID: [name], male   DOB: [birth year], 78 y.o.   MRN: [id number]  Chief Complaint  Patient presents with  . Other    gallbladder    HPI TRAYVON Mann is a 78 y.o. male.  This is a pleasant gentleman referred by Dr. Jena Gauss for evaluation of potential symptomatic cholelithiasis. He is a rather poor historian. He is been having back pain for many years and left-sided abdominal pain. He denies any abdominal pain with what he eats although on further questioning he may have right upper quadrant discomfort. He denies nausea and vomiting. He has had an extensive workup for his various medical problems. He has many significant chronic medical issues. HPI  Past Medical History  Diagnosis Date  . Rheumatoid arthritis   . COPD (chronic obstructive pulmonary disease)   . Disseminated herpes zoster 2010  . GERD (gastroesophageal reflux disease)   . Asthma   . Pulmonary embolus     2003 and 2011  . Pulmonary nodules      Chest CT 09/11  . Mixed hyperlipidemia   . Atrial fibrillation     CHADS2 score 2  . Otitis externa, left   . Lupus anticoagulant positive   . Hypertension     Past Surgical History  Procedure Date  . Cataract extraction     bilateral  . Lumber fusion    . Bilateral l2 laminectomy     three back surgery 2003/2009/2011  . Tube in ear     both ears  . Polypectomy 02/02/2012    Procedure: POLYPECTOMY;  Surgeon: Corbin Ade, MD;  Location: [facility];  Service: Endoscopy;;    Family History  Problem Relation Age of Onset  . Colon cancer Neg Hx   . Liver disease Neg Hx     Social History History  Substance Use Topics  . Smoking status: Former Smoker -- 1.0 packs/day for 57 years    Types: Cigarettes    Quit date: 01/27/1998  . Smokeless tobacco: Never Used  . Alcohol Use: No     quit at age 22    Allergies  Allergen Reactions  . Procaine Hcl     REACTION: jerk    Current Outpatient Prescriptions  Medication Sig Dispense Refill  . albuterol  (PROAIR HFA) 108 (90 BASE) MCG/ACT inhaler Inhale 2 puffs into the lungs every 6 (six) hours as needed. Shortness of Breath      . albuterol (PROVENTIL) (2.5 MG/3ML) 0.083% nebulizer solution Take 2.5 mg by nebulization every 6 (six) hours as needed.        . cetirizine (ZYRTEC) 10 MG tablet Take 10 mg by mouth daily.        . cloNIDine (CATAPRES) 0.1 MG tablet Take 0.1 mg by mouth 2 (two) times daily.        Marland Kitchen diltiazem (CARDIZEM CD) 180 MG 24 hr capsule Take 1 capsule (180 mg total) by mouth daily.  30 capsule  7  . Etanercept (ENBREL) 25 MG/0.5ML SOLN Inject 25 mg into the skin 2 (two) times a week.        . folic acid (FOLVITE) 1 MG tablet Take 1 mg by mouth daily.       . furosemide (LASIX) 40 MG tablet Take 80 mg by mouth daily. In the morning & 1  By mouth at night      . gabapentin (NEURONTIN) 300 MG capsule Take 300 mg by mouth 3 (three) times daily.       Marland Kitchen  HYDROcodone-acetaminophen (VICODIN) 5-500 MG per tablet Take 1 tablet by mouth 2 (two) times daily. Pain      . methotrexate (RHEUMATREX) 2.5 MG tablet Takes 5 tablets on Friday and Saturday.      . Omega-3 Fatty Acids (FISH OIL) 1000 MG CAPS Take one by mouth twice daily        . omeprazole (PRILOSEC) 20 MG capsule Take 20 mg by mouth 2 (two) times daily.       Marland Kitchen oxyCODONE-acetaminophen (PERCOCET) 10-325 MG per tablet Take 1 tablet by mouth every 8 (eight) hours as needed.      . OXYGEN-HELIUM IN Inhale 2 L into the lungs at bedtime as needed.      . potassium chloride SA (K-DUR,KLOR-CON) 20 MEQ tablet Take 20 mEq by mouth 2 (two) times daily.        . Sod Picosulfate-Mag Ox-Cit Acd (PREPOPIK) 10-3.5-12 MG-GM-GM PACK Take 1 kit by mouth once.  1 each  0  . warfarin (COUMADIN) 2 MG tablet Take 2-3 tablets (4-6 mg total) by mouth daily. 4 mg on Monday, Tuesday, Thursday, Friday and Sunday 6 mg on Wednesday and Saturday  120 tablet  11    Review of Systems Review of Systems  Constitutional: Negative for fever, chills and unexpected  weight change.  HENT: Negative for hearing loss, congestion, sore throat, trouble swallowing and voice change.   Eyes: Negative for visual disturbance.  Respiratory: Positive for shortness of breath. Negative for cough and wheezing.   Cardiovascular: Positive for leg swelling. Negative for chest pain and palpitations.  Gastrointestinal: Positive for abdominal pain. Negative for nausea, vomiting, diarrhea, constipation, blood in stool, abdominal distention, anal bleeding and rectal pain.  Genitourinary: Negative for hematuria and difficulty urinating.  Musculoskeletal: Positive for back pain, joint swelling and arthralgias.  Skin: Negative for rash and wound.  Neurological: Positive for headaches. Negative for seizures, syncope and weakness.  Hematological: Negative for adenopathy. Does not bruise/bleed easily.  Psychiatric/Behavioral: Negative for confusion.    Blood pressure 124/86, pulse 84, temperature 98.6 F (37 C), temperature source Temporal, resp. rate 18, height 6\' 1"  (1.854 m), weight 215 lb 6.4 oz (97.705 kg).  Physical Exam Physical Exam  Constitutional: He is oriented to person, place, and time. He appears well-developed and well-nourished. No distress.  HENT:  Head: Normocephalic and atraumatic.  Right Ear: External ear normal.  Left Ear: External ear normal.  Nose: Nose normal.  Mouth/Throat: Oropharynx is clear and moist. No oropharyngeal exudate.  Eyes: Conjunctivae are normal. Pupils are equal, round, and reactive to light. No scleral icterus.  Neck: Normal range of motion. Neck supple. No tracheal deviation present. No thyromegaly present.  Cardiovascular: Normal rate, normal heart sounds and intact distal pulses.        Irregular rhythm  Pulmonary/Chest: Effort normal. He has wheezes.  Abdominal: Soft. Bowel sounds are normal.       There is slight tenderness with guarding and liver fullness in the right upper quadrant  Musculoskeletal: He exhibits no edema and no  tenderness.  Lymphadenopathy:    He has no cervical adenopathy.  Neurological: He is alert and oriented to person, place, and time.  Skin: Skin is warm and dry. No rash noted. He is not diaphoretic. No erythema.  Psychiatric: His behavior is normal. Judgment normal.    Data Reviewed I have reviewed the patient's ultrasound and MRI  Assessment    Cholelithiasis    Plan    It is difficult to tell whether  he is symptomatic or not. He is quite concerned about his medical history and was told on any surgery for now. I did explain to him that this was a difficult situation in light of his need for chronic anticoagulation. I have still uncertain whether or he is having issues with the gallbladder not. I'm going to see him back in a month. If he does show symptoms consistent with gallbladder we will then start making arrangements for cholecystectomy and potential liver biopsy. He will need preoperative clearance from cardiology as expected. He will call me back if he starts developing worsening symptoms.       Jaime Dome A 05/15/2012, 10:57 AM

## 2012-05-21 ENCOUNTER — Encounter (HOSPITAL_COMMUNITY): Payer: Self-pay | Admitting: *Deleted

## 2012-05-21 ENCOUNTER — Emergency Department: Admit: 2012-05-21 | Payer: Self-pay

## 2012-05-21 ENCOUNTER — Emergency Department (HOSPITAL_COMMUNITY)
Admission: EM | Admit: 2012-05-21 | Discharge: 2012-05-21 | Payer: Medicare PPO | Source: Home / Self Care | Attending: Emergency Medicine | Admitting: Emergency Medicine

## 2012-05-21 ENCOUNTER — Encounter (HOSPITAL_COMMUNITY): Payer: Self-pay | Admitting: Emergency Medicine

## 2012-05-21 ENCOUNTER — Inpatient Hospital Stay (HOSPITAL_COMMUNITY): Payer: Medicare PPO

## 2012-05-21 ENCOUNTER — Inpatient Hospital Stay (HOSPITAL_COMMUNITY)
Admission: EM | Admit: 2012-05-21 | Discharge: 2012-05-25 | DRG: 418 | Disposition: A | Discharge status: Home / Self Care | Payer: Medicare PPO | Attending: Internal Medicine | Admitting: Internal Medicine

## 2012-05-21 ENCOUNTER — Emergency Department (HOSPITAL_COMMUNITY): Payer: Medicare PPO

## 2012-05-21 DIAGNOSIS — J449 Chronic obstructive pulmonary disease, unspecified: Secondary | ICD-10-CM | POA: Insufficient documentation

## 2012-05-21 DIAGNOSIS — J438 Other emphysema: Secondary | ICD-10-CM

## 2012-05-21 DIAGNOSIS — E669 Obesity, unspecified: Secondary | ICD-10-CM | POA: Diagnosis present

## 2012-05-21 DIAGNOSIS — K810 Acute cholecystitis: Secondary | ICD-10-CM | POA: Diagnosis present

## 2012-05-21 DIAGNOSIS — K219 Gastro-esophageal reflux disease without esophagitis: Secondary | ICD-10-CM | POA: Diagnosis present

## 2012-05-21 DIAGNOSIS — D72829 Elevated white blood cell count, unspecified: Secondary | ICD-10-CM | POA: Diagnosis present

## 2012-05-21 DIAGNOSIS — Z9981 Dependence on supplemental oxygen: Secondary | ICD-10-CM

## 2012-05-21 DIAGNOSIS — M1990 Unspecified osteoarthritis, unspecified site: Secondary | ICD-10-CM | POA: Diagnosis present

## 2012-05-21 DIAGNOSIS — K8020 Calculus of gallbladder without cholecystitis without obstruction: Secondary | ICD-10-CM

## 2012-05-21 DIAGNOSIS — I5030 Unspecified diastolic (congestive) heart failure: Secondary | ICD-10-CM | POA: Diagnosis present

## 2012-05-21 DIAGNOSIS — Z7901 Long term (current) use of anticoagulants: Secondary | ICD-10-CM | POA: Insufficient documentation

## 2012-05-21 DIAGNOSIS — M329 Systemic lupus erythematosus, unspecified: Secondary | ICD-10-CM | POA: Insufficient documentation

## 2012-05-21 DIAGNOSIS — J4489 Other specified chronic obstructive pulmonary disease: Secondary | ICD-10-CM | POA: Insufficient documentation

## 2012-05-21 DIAGNOSIS — R760 Raised antibody titer: Secondary | ICD-10-CM | POA: Diagnosis present

## 2012-05-21 DIAGNOSIS — R110 Nausea: Secondary | ICD-10-CM | POA: Insufficient documentation

## 2012-05-21 DIAGNOSIS — I10 Essential (primary) hypertension: Secondary | ICD-10-CM | POA: Insufficient documentation

## 2012-05-21 DIAGNOSIS — M069 Rheumatoid arthritis, unspecified: Secondary | ICD-10-CM | POA: Insufficient documentation

## 2012-05-21 DIAGNOSIS — D6859 Other primary thrombophilia: Secondary | ICD-10-CM | POA: Diagnosis present

## 2012-05-21 DIAGNOSIS — Z9889 Other specified postprocedural states: Secondary | ICD-10-CM | POA: Insufficient documentation

## 2012-05-21 DIAGNOSIS — K8066 Calculus of gallbladder and bile duct with acute and chronic cholecystitis without obstruction: Secondary | ICD-10-CM

## 2012-05-21 DIAGNOSIS — K812 Acute cholecystitis with chronic cholecystitis: Principal | ICD-10-CM | POA: Diagnosis present

## 2012-05-21 DIAGNOSIS — Z79899 Other long term (current) drug therapy: Secondary | ICD-10-CM | POA: Insufficient documentation

## 2012-05-21 DIAGNOSIS — I482 Chronic atrial fibrillation, unspecified: Secondary | ICD-10-CM | POA: Diagnosis present

## 2012-05-21 DIAGNOSIS — I4891 Unspecified atrial fibrillation: Secondary | ICD-10-CM | POA: Diagnosis present

## 2012-05-21 DIAGNOSIS — E785 Hyperlipidemia, unspecified: Secondary | ICD-10-CM | POA: Insufficient documentation

## 2012-05-21 DIAGNOSIS — R109 Unspecified abdominal pain: Secondary | ICD-10-CM | POA: Insufficient documentation

## 2012-05-21 HISTORY — DX: Low back pain, unspecified: M54.50

## 2012-05-21 HISTORY — DX: Other chronic pain: G89.29

## 2012-05-21 HISTORY — DX: Personal history of other infectious and parasitic diseases: Z86.19

## 2012-05-21 HISTORY — DX: Anemia, unspecified: D64.9

## 2012-05-21 HISTORY — DX: Low back pain: M54.5

## 2012-05-21 LAB — URINALYSIS, ROUTINE W REFLEX MICROSCOPIC
Leukocytes, UA: NEGATIVE
Nitrite: NEGATIVE
Specific Gravity, Urine: <1.005 — ABNORMAL LOW (ref 1.005–1.030)
Urobilinogen, UA: 0.2 mg/dL (ref 0.0–1.0)

## 2012-05-21 LAB — COMPREHENSIVE METABOLIC PANEL
AST: 17 U/L (ref 0–37)
Albumin: 3.5 g/dL (ref 3.5–5.2)
BUN: 19 mg/dL (ref 6–23)
Calcium: 10 mg/dL (ref 8.4–10.5)
Creatinine, Ser: 0.99 mg/dL (ref 0.50–1.35)
GFR calc non Af Amer: 76 mL/min — ABNORMAL LOW (ref >90)
Total Bilirubin: 2.1 mg/dL — ABNORMAL HIGH (ref 0.3–1.2)

## 2012-05-21 LAB — DIFFERENTIAL
Basophils Absolute: 0.1 10*3/uL (ref 0.0–0.1)
Basophils Relative: 0 % (ref 0–1)
Eosinophils Relative: 1 % (ref 0–5)
Monocytes Absolute: 1.6 10*3/uL — ABNORMAL HIGH (ref 0.1–1.0)
Monocytes Relative: 8 % (ref 3–12)

## 2012-05-21 LAB — CBC
HCT: 40.5 % (ref 39.0–52.0)
Hemoglobin: 13.5 g/dL (ref 13.0–17.0)
MCH: 32.1 pg (ref 26.0–34.0)
MCHC: 33.3 g/dL (ref 30.0–36.0)
MCV: 96.4 fL (ref 78.0–100.0)
RDW: 17.5 % — ABNORMAL HIGH (ref 11.5–15.5)

## 2012-05-21 LAB — PROTIME-INR
INR: 1.42 (ref 0.00–1.49)
Prothrombin Time: 17.6 seconds — ABNORMAL HIGH (ref 11.6–15.2)

## 2012-05-21 LAB — LACTIC ACID, PLASMA: Lactic Acid, Venous: 1.1 mmol/L (ref 0.5–2.2)

## 2012-05-21 LAB — LIPASE, BLOOD
Lipase: 28 U/L (ref 11–59)
Lipase: 22 U/L (ref 11–59)

## 2012-05-21 MED ORDER — FUROSEMIDE 40 MG PO TABS
40.0000 mg | ORAL_TABLET | Freq: Two times a day (BID) | ORAL | Status: DC
  Administered 2012-05-21 – 2012-05-25 (×7): 40 mg via ORAL
  Filled 2012-05-21 (×9): qty 1

## 2012-05-21 MED ORDER — POTASSIUM CHLORIDE CRYS ER 20 MEQ PO TBCR
20.0000 meq | EXTENDED_RELEASE_TABLET | Freq: Two times a day (BID) | ORAL | Status: DC
  Administered 2012-05-21 – 2012-05-25 (×7): 20 meq via ORAL
  Filled 2012-05-21 (×11): qty 1

## 2012-05-21 MED ORDER — ACETAMINOPHEN 650 MG RE SUPP: 650.0000 mg | Freq: Four times a day (QID) | RECTAL | Status: DC | PRN

## 2012-05-21 MED ORDER — ACETAMINOPHEN 325 MG PO TABS
650.0000 mg | ORAL_TABLET | Freq: Four times a day (QID) | ORAL | Status: DC | PRN
  Administered 2012-05-22 (×2): 650 mg via ORAL
  Filled 2012-05-21 (×2): qty 2

## 2012-05-21 MED ORDER — HYDROCODONE-ACETAMINOPHEN 5-325 MG PO TABS
1.0000 | ORAL_TABLET | ORAL | Status: DC | PRN
  Administered 2012-05-22 – 2012-05-25 (×9): 2 via ORAL
  Administered 2012-05-25: 1 via ORAL
  Filled 2012-05-21 (×10): qty 2

## 2012-05-21 MED ORDER — ALBUTEROL SULFATE (5 MG/ML) 0.5% IN NEBU: 2.5000 mg | INHALATION_SOLUTION | RESPIRATORY_TRACT | Status: DC | PRN

## 2012-05-21 MED ORDER — CLONIDINE HCL 0.1 MG PO TABS
0.1000 mg | ORAL_TABLET | Freq: Two times a day (BID) | ORAL | Status: DC
  Administered 2012-05-21 – 2012-05-25 (×7): 0.1 mg via ORAL
  Filled 2012-05-21 (×9): qty 1

## 2012-05-21 MED ORDER — FOLIC ACID 1 MG PO TABS
1.0000 mg | ORAL_TABLET | Freq: Every day | ORAL | Status: DC
  Administered 2012-05-21 – 2012-05-25 (×5): 1 mg via ORAL
  Filled 2012-05-21 (×5): qty 1

## 2012-05-21 MED ORDER — METHYLPREDNISOLONE SODIUM SUCC 40 MG IJ SOLR
40.0000 mg | Freq: Every day | INTRAMUSCULAR | Status: DC
  Administered 2012-05-21 – 2012-05-23 (×3): 40 mg via INTRAVENOUS
  Filled 2012-05-21 (×4): qty 1

## 2012-05-21 MED ORDER — PANTOPRAZOLE SODIUM 40 MG PO TBEC
40.0000 mg | DELAYED_RELEASE_TABLET | Freq: Every day | ORAL | Status: DC
  Administered 2012-05-21 – 2012-05-25 (×4): 40 mg via ORAL
  Filled 2012-05-21 (×4): qty 1

## 2012-05-21 MED ORDER — ALBUTEROL SULFATE (5 MG/ML) 0.5% IN NEBU
2.5000 mg | INHALATION_SOLUTION | Freq: Three times a day (TID) | RESPIRATORY_TRACT | Status: DC
  Administered 2012-05-22: 2.5 mg via RESPIRATORY_TRACT
  Filled 2012-05-21: qty 0.5

## 2012-05-21 MED ORDER — ONDANSETRON HCL 4 MG/2ML IJ SOLN
4.0000 mg | Freq: Once | INTRAMUSCULAR | Status: AC
  Administered 2012-05-21: 4 mg via INTRAVENOUS
  Filled 2012-05-21: qty 2

## 2012-05-21 MED ORDER — PIPERACILLIN-TAZOBACTAM 3.375 G IVPB 30 MIN
3.3750 g | Freq: Three times a day (TID) | INTRAVENOUS | Status: DC
  Administered 2012-05-22 – 2012-05-25 (×11): 3.375 g via INTRAVENOUS
  Filled 2012-05-21 (×14): qty 50

## 2012-05-21 MED ORDER — PIPERACILLIN-TAZOBACTAM 3.375 G IVPB
3.3750 g | Freq: Once | INTRAVENOUS | Status: DC
  Administered 2012-05-21: 3.375 g via INTRAVENOUS
  Filled 2012-05-21: qty 50

## 2012-05-21 MED ORDER — ZOLPIDEM TARTRATE 5 MG PO TABS: 5.0000 mg | ORAL_TABLET | Freq: Every evening | ORAL | Status: DC | PRN

## 2012-05-21 MED ORDER — ONDANSETRON HCL 4 MG/2ML IJ SOLN: 4.0000 mg | Freq: Four times a day (QID) | INTRAMUSCULAR | Status: DC | PRN

## 2012-05-21 MED ORDER — SODIUM CHLORIDE 0.9 % IJ SOLN
3.0000 mL | Freq: Two times a day (BID) | INTRAMUSCULAR | Status: DC
  Administered 2012-05-21 – 2012-05-25 (×7): 3 mL via INTRAVENOUS

## 2012-05-21 MED ORDER — SENNA 8.6 MG PO TABS
1.0000 | ORAL_TABLET | Freq: Two times a day (BID) | ORAL | Status: DC
  Administered 2012-05-21 – 2012-05-25 (×8): 8.6 mg via ORAL
  Filled 2012-05-21 (×9): qty 1

## 2012-05-21 MED ORDER — LORATADINE 10 MG PO TABS
10.0000 mg | ORAL_TABLET | Freq: Every day | ORAL | Status: DC
  Administered 2012-05-21 – 2012-05-25 (×5): 10 mg via ORAL
  Filled 2012-05-21 (×5): qty 1

## 2012-05-21 MED ORDER — MORPHINE SULFATE 4 MG/ML IJ SOLN
4.0000 mg | INTRAMUSCULAR | Status: DC | PRN
  Administered 2012-05-23 (×2): 4 mg via INTRAVENOUS
  Filled 2012-05-21 (×2): qty 1

## 2012-05-21 MED ORDER — GABAPENTIN 300 MG PO CAPS
300.0000 mg | ORAL_CAPSULE | Freq: Three times a day (TID) | ORAL | Status: DC
  Administered 2012-05-21 – 2012-05-25 (×10): 300 mg via ORAL
  Filled 2012-05-21 (×13): qty 1

## 2012-05-21 MED ORDER — DILTIAZEM HCL ER COATED BEADS 180 MG PO CP24
180.0000 mg | ORAL_CAPSULE | Freq: Every day | ORAL | Status: DC
  Administered 2012-05-21 – 2012-05-23 (×3): 180 mg via ORAL
  Filled 2012-05-21 (×4): qty 1

## 2012-05-21 MED ORDER — HYDROMORPHONE HCL PF 1 MG/ML IJ SOLN
1.0000 mg | Freq: Once | INTRAMUSCULAR | Status: AC
Start: 2012-05-21 — End: 2012-05-21
  Administered 2012-05-21: 1 mg via INTRAVENOUS
  Filled 2012-05-21: qty 1

## 2012-05-21 MED ORDER — MORPHINE SULFATE 4 MG/ML IJ SOLN
4.0000 mg | Freq: Once | INTRAMUSCULAR | Status: AC
  Administered 2012-05-21: 4 mg via INTRAVENOUS
  Filled 2012-05-21: qty 1

## 2012-05-21 MED ORDER — METHOTREXATE 2.5 MG PO TABS
17.5000 mg | ORAL_TABLET | ORAL | Status: DC
  Administered 2012-05-25: 17.5 mg via ORAL
  Filled 2012-05-21: qty 7

## 2012-05-21 MED ORDER — SODIUM CHLORIDE 0.9 % IV BOLUS (SEPSIS)
1000.0000 mL | Freq: Once | INTRAVENOUS | Status: AC
  Administered 2012-05-21: 1000 mL via INTRAVENOUS

## 2012-05-21 MED ORDER — ETANERCEPT 25 MG/0.5ML ~~LOC~~ SOSY: 25.0000 mg | PREFILLED_SYRINGE | SUBCUTANEOUS | Status: DC

## 2012-05-21 MED ORDER — IOHEXOL 300 MG/ML  SOLN
100.0000 mL | Freq: Once | INTRAMUSCULAR | Status: AC | PRN
  Administered 2012-05-21: 100 mL via INTRAVENOUS

## 2012-05-21 MED ORDER — ONDANSETRON HCL 4 MG PO TABS: 4.0000 mg | ORAL_TABLET | Freq: Four times a day (QID) | ORAL | Status: DC | PRN

## 2012-05-21 NOTE — ED Notes (Signed)
Heart Healthy tray ordered.  

## 2012-05-21 NOTE — ED Notes (Signed)
Pt in ct 

## 2012-05-21 NOTE — ED Notes (Signed)
Urine accidentally clicked. Not obtained at this time

## 2012-05-21 NOTE — Consult Note (Signed)
Reason for Consult:Gallbladder disease  Referring Physician: Rourk - GI - [place] Cardiologist - Diona Browner Rheumatologist - Kellie Simmering  [name] is an 78 y.o. male.  HPI: 78 yo male with extensive PMH presents in transfer from [facility] for evaluation of symptomatic gallbladder disease.  The patient has had recent extensive work-up by Dr. Jena Gauss, including an abdominal MRI which showed thick sludge in the gallbladder with some very small gallstones.  He was evaluated as an outpatient by Dr. Carman Ching at [facility] earlier this week and was supposed to come back in 1 month for reevaluation.  The patient is chronically anticoagulated for atrial fibrillation, history of pulmonary embolism, and lupus anticoagulant positive.    Two days ago, the patient ate some food containing cheese.  Shortly thereafter, he began having some RUQ pain.  He has been having some generalized abdominal pain, but this episode is mostly localized on the right side.  No nausea, vomiting, or diarrhea.  He went to the ED at [facility] today and was transferred to [facility] for further evaluation.  Past Medical History  Diagnosis Date  . Rheumatoid arthritis   . COPD (chronic obstructive pulmonary disease)   . Disseminated herpes zoster 2010  . GERD (gastroesophageal reflux disease)   . Asthma   . Pulmonary embolus     2003 and 2011  . Pulmonary nodules      Chest CT 09/11  . Mixed hyperlipidemia   . Atrial fibrillation     CHADS2 score 2  . Otitis externa, left   . Lupus anticoagulant positive   . Hypertension   . Gall stones     Past Surgical History  Procedure Date  . Cataract extraction     bilateral  . Lumber fusion    . Bilateral l2 laminectomy     three back surgery 2003/2009/2011  . Tube in ear     both ears  . Polypectomy 02/02/2012    Procedure: POLYPECTOMY;  Surgeon: Corbin Ade, MD;  Location: [facility];  Service: Endoscopy;;    Family History  Problem Relation Age of Onset  .  Colon cancer Neg Hx   . Liver disease Neg Hx     Social History:  reports that he quit smoking about 14 years ago. His smoking use included Cigarettes. He has a 57 pack-year smoking history. He has never used smokeless tobacco. He reports that he does not drink alcohol or use illicit drugs.   He was a heavy drinker in the past.    Allergies:  Allergies  Allergen Reactions  . Cymbalta (Duloxetine Hcl) Other (See Comments)    Confusion   . Procaine Hcl Hives    Sweating, Confusion, Not in right state of mind.    Medications: Prior to Admission:  (Not in a hospital admission) No current facility-administered medications for this encounter. Current outpatient prescriptions:albuterol (PROAIR HFA) 108 (90 BASE) MCG/ACT inhaler, Inhale 2 puffs into the lungs every 6 (six) hours as needed. Shortness of Breath, Disp: , Rfl: ;  albuterol (PROVENTIL) (2.5 MG/3ML) 0.083% nebulizer solution, Take 2.5 mg by nebulization every 6 (six) hours as needed. Shortness of Breath, Disp: , Rfl: ;  cetirizine (ZYRTEC) 10 MG tablet, Take 10 mg by mouth daily.  , Disp: , Rfl:  cloNIDine (CATAPRES) 0.1 MG tablet, Take 0.1 mg by mouth 2 (two) times daily.  , Disp: , Rfl: ;  diltiazem (CARDIZEM CD) 180 MG 24 hr capsule, Take 1 capsule (180 mg total) by  mouth daily., Disp: 30 capsule, Rfl: 7;  Etanercept (ENBREL) 25 MG/0.5ML SOLN, Inject 25 mg into the skin 2 (two) times a week.  , Disp: , Rfl: ;  folic acid (FOLVITE) 1 MG tablet, Take 1 mg by mouth daily. , Disp: , Rfl:  furosemide (LASIX) 40 MG tablet, Take 40 mg by mouth 2 (two) times daily. , Disp: , Rfl: ;  gabapentin (NEURONTIN) 300 MG capsule, Take 300 mg by mouth 3 (three) times daily. , Disp: , Rfl: ;  methotrexate (RHEUMATREX) 2.5 MG tablet, Take 17.5 mg by mouth once a week. Takes 7 tablets on Friday, Disp: , Rfl: ;  Omega-3 Fatty Acids (FISH OIL) 1000 MG CAPS, Take 1 capsule by mouth 2 (two) times daily. , Disp: , Rfl:  omeprazole (PRILOSEC) 20 MG capsule, Take 20  mg by mouth 2 (two) times daily. , Disp: , Rfl: ;  oxyCODONE-acetaminophen (PERCOCET) 10-325 MG per tablet, Take 1 tablet by mouth every 8 (eight) hours as needed. Pain, Disp: , Rfl: ;  potassium chloride SA (K-DUR,KLOR-CON) 20 MEQ tablet, Take 20 mEq by mouth 2 (two) times daily.  , Disp: , Rfl: ;  warfarin (COUMADIN) 2 MG tablet, Take 2 mg by mouth daily., Disp: , Rfl:  DISCONTD: warfarin (COUMADIN) 2 MG tablet, Take 2-3 tablets (4-6 mg total) by mouth daily. 4 mg on Monday, Tuesday, Thursday, Friday and Sunday 6 mg on Wednesday and Saturday, Disp: 120 tablet, Rfl: 11 Facility-Administered Medications Ordered in Other Encounters: HYDROmorphone (DILAUDID) injection 1 mg, 1 mg, Intravenous, Once, Glynn Octave, MD, 1 mg at 05/21/12 1338;  iohexol (OMNIPAQUE) 300 MG/ML solution 100 mL, 100 mL, Intravenous, Once PRN, Medication Radiologist, MD, 100 mL at 05/21/12 1424;  ondansetron (ZOFRAN) injection 4 mg, 4 mg, Intravenous, Once, Glynn Octave, MD, 4 mg at 05/21/12 1338 sodium chloride 0.9 % bolus 1,000 mL, 1,000 mL, Intravenous, Once, Glynn Octave, MD, 1,000 mL at 05/21/12 1338;  DISCONTD: piperacillin-tazobactam (ZOSYN) IVPB 3.375 g, 3.375 g, Intravenous, Once, Glynn Octave, MD, 3.375 g at 05/21/12 1538  Results for orders placed during the hospital encounter of 05/21/12 (from the past 48 hour(s))  CBC     Status: Abnormal   Collection Time   05/21/12  1:08 PM      Component Value Range Comment   WBC 19.9 (*) 4.0 - 10.5 (K/uL)    RBC 4.20 (*) 4.22 - 5.81 (MIL/uL)    Hemoglobin 13.5  13.0 - 17.0 (g/dL)    HCT 65.7  84.6 - 96.2 (%)    MCV 96.4  78.0 - 100.0 (fL)    MCH 32.1  26.0 - 34.0 (pg)    MCHC 33.3  30.0 - 36.0 (g/dL)    RDW 95.2 (*) 84.1 - 15.5 (%)    Platelets 293  150 - 400 (K/uL)   DIFFERENTIAL     Status: Abnormal   Collection Time   05/21/12  1:08 PM      Component Value Range Comment   Neutrophils Relative 82 (*) 43 - 77 (%)    Neutro Abs 16.4 (*) 1.7 - 7.7 (K/uL)     Lymphocytes Relative 8 (*) 12 - 46 (%)    Lymphs Abs 1.7  0.7 - 4.0 (K/uL)    Monocytes Relative 8  3 - 12 (%)    Monocytes Absolute 1.6 (*) 0.1 - 1.0 (K/uL)    Eosinophils Relative 1  0 - 5 (%)    Eosinophils Absolute 0.2  0.0 - 0.7 (K/uL)  Basophils Relative 0  0 - 1 (%)    Basophils Absolute 0.1  0.0 - 0.1 (K/uL)   COMPREHENSIVE METABOLIC PANEL     Status: Abnormal   Collection Time   05/21/12  1:08 PM      Component Value Range Comment   Sodium 138  135 - 145 (mEq/L)    Potassium 3.6  3.5 - 5.1 (mEq/L)    Chloride 97  96 - 112 (mEq/L)    CO2 35 (*) 19 - 32 (mEq/L)    Glucose, Bld 112 (*) 70 - 99 (mg/dL)    BUN 19  6 - 23 (mg/dL)    Creatinine, Ser 1.61  0.50 - 1.35 (mg/dL)    Calcium 09.6  8.4 - 10.5 (mg/dL)    Total Protein 7.8  6.0 - 8.3 (g/dL)    Albumin 3.5  3.5 - 5.2 (g/dL)    AST 17  0 - 37 (U/L)    ALT 18  0 - 53 (U/L)    Alkaline Phosphatase 58  39 - 117 (U/L)    Total Bilirubin 2.1 (*) 0.3 - 1.2 (mg/dL)    GFR calc non Af Amer 76 (*) >90 (mL/min)    GFR calc Af Amer 89 (*) >90 (mL/min)   PROTIME-INR     Status: Abnormal   Collection Time   05/21/12  1:08 PM      Component Value Range Comment   Prothrombin Time 17.6 (*) 11.6 - 15.2 (seconds)    INR 1.42  0.00 - 1.49    LIPASE, BLOOD     Status: Normal   Collection Time   05/21/12  1:08 PM      Component Value Range Comment   Lipase 28  11 - 59 (U/L)   LACTIC ACID, PLASMA     Status: Normal   Collection Time   05/21/12  1:08 PM      Component Value Range Comment   Lactic Acid, Venous 1.1  0.5 - 2.2 (mmol/L)   URINALYSIS, ROUTINE W REFLEX MICROSCOPIC     Status: Abnormal   Collection Time   05/21/12  3:20 PM      Component Value Range Comment   Color, Urine YELLOW  YELLOW     APPearance CLEAR  CLEAR     Specific Gravity, Urine <1.005 (*) 1.005 - 1.030     pH 6.5  5.0 - 8.0     Glucose, UA NEGATIVE  NEGATIVE (mg/dL)    Hgb urine dipstick NEGATIVE  NEGATIVE     Bilirubin Urine NEGATIVE  NEGATIVE      Ketones, ur NEGATIVE  NEGATIVE (mg/dL)    Protein, ur NEGATIVE  NEGATIVE (mg/dL)    Urobilinogen, UA 0.2  0.0 - 1.0 (mg/dL)    Nitrite NEGATIVE  NEGATIVE     Leukocytes, UA NEGATIVE  NEGATIVE  MICROSCOPIC NOT DONE ON URINES WITH NEGATIVE PROTEIN, BLOOD, LEUKOCYTES, NITRITE, OR GLUCOSE <1000 mg/dL.    Ct Abdomen Pelvis W Contrast  05/21/2012  *RADIOLOGY REPORT*  Clinical Data: Abdominal pain.  Hypertension.  History of cholelithiasis.  Gastroesophageal reflux disease.  CT ABDOMEN AND PELVIS WITH CONTRAST  Technique:  Multidetector CT imaging of the abdomen and pelvis was performed following the standard protocol during bolus administration of intravenous contrast.  Contrast: OMNIPAQUE IOHEXOL 300 MG/ML  SOLN  Comparison: Multiple exams, including 04/12/2012 and 01/24/2012  Findings: Emphysema noted at the lung bases.  Faint peripheral interstitial accentuation suggest mild fibrosis.  Coronary artery atherosclerotic calcification noted  with mitral annular calcification.  Oral contrast medium in the distal esophagus is compatible with gastroesophageal reflux.  There is a small hiatal hernia.  Mild atelectasis or scarring noted in the right middle lobe. Linear opacity within the 8 mm nodular component is observed in the lingula on image 12 of series 3; the patient has had similar nodules described on prior exams including 03/29/2011 although this nodule or area of rounded atelectasis appears to be lower in position.  There was previously a greater amount of atelectasis in this location.  Scattered cysts are present in the lateral segment left hepatic lobe.  Dependent density in the gallbladder appears stable and has been previously worked up pound represent tumefactive sludge and possible small gallstones.  Old granulomatous disease of the spleen noted.  The pancreas and adrenal glands appear unremarkable by CT. Bilateral renal cysts noted.  Aortoiliac atherosclerotic calcification is present.  The  appendix appears normal.  No pathologic retroperitoneal or porta hepatis adenopathy is identified.  No pathologic pelvic adenopathy is identified.  Urinary bladder appears normal.  There is degenerative arthropathy of both hips, with findings of avascular necrosis or more likely degenerative arthropathy in the right femoral head.  Chronic lumbar spine endplate compressions noted.  Posterolateral rod pedicle screw fixation noted at the L4-5 level, with grade 1 anterolisthesis of L4 on L5 and multilevel lumbar spondylosis. Mild band of sclerosis along the concave superior endplate of T11 suggests subacute compression fracture at T11.  IMPRESSION:  1.  Gastroesophageal reflux. 2.  Suspected subacute compression fracture along the superior endplate of T11. 3.  Hepatic and renal cysts. 4.  Tumefactive sludge in the gallbladder. 5.  Atherosclerosis. 6.  Degenerative arthropathy of the hips, with subcortical degenerative cysts or mild avascular necrosis in the right femoral head. 7.  8 mm nodular component of a band-like density in the lingula, possibly representing rounded atelectasis or a true pulmonary nodule.  The patient has a history of pulmonary nodules which were seen on CT scan of 03/29/2011; a follow-up CT the chest was recommended on 03/29/2011 but is not apparent in the [facility] system - follow-up CT of the chest is likely warranted if not performed at an outside institution. 8.  Emphysema with suspected mild fibrosis in the lung bases. 9.  Small hiatal hernia.  Original Report Authenticated By: Dellia Cloud, M.D.    Review of Systems  Eyes: Negative.   Respiratory: Positive for shortness of breath.   Cardiovascular: Positive for leg swelling.  Gastrointestinal: Positive for abdominal pain.  Musculoskeletal: Positive for back pain and joint pain.  Neurological: Negative.   Endo/Heme/Allergies: Negative.      Blood pressure 100/68, pulse 121, temperature 98.8 F (37.1 C), temperature  source Oral, resp. rate 12, SpO2 95.00%. Physical Exam Obese WM in NAD HEENT: - EOMI, sclera anicteric Lungs - CTA B CV - RRR Abd - protuberant; no palpable masses; diffusely tender, but mostly in the RUQ; no hernias Extremities - 2+ edema Skin - no jaundice   Assessment/Plan: Symptomatic gallbladder sludge/ tiny stones - elevated WBC, normal LFT's,  Hx of heavy EtOH - cirrhosis Rheumatoid arthritis HTN Hx of pulmonary embolism COPD Lupus anticoagulant + GERD  Will ask [facility] to see patient for admission.  We will consult and will be ready to perform laparoscopic cholecystectomy once cleared for surgery.  Considering his multiple comorbidities, he is at higher risk for complications after surgery.  Sukhraj Esquivias K. 05/21/2012, 5:50 PM

## 2012-05-21 NOTE — ED Provider Notes (Addendum)
History     CSN: [id number]  Arrival date & time 05/21/12  1110   First MD Initiated Contact with Patient 05/21/12 1251      Chief Complaint  Patient presents with  . Abdominal Pain    (Consider location/radiation/quality/duration/timing/severity/associated sxs/prior treatment) HPI Comments: Patient presents with 2 days of right upper quadrant pain that is not associated with nausea or vomiting. He has a history of gallbladder sludge and recently saw Dr. Magnus Ivan from last week for cholelithiasis. The pain is in his right upper quadrant, right lower quadrant and worse with palpation. 7 poor appetite but not vomited. No fever, diarrhea, chest pain, shortness of breath or back pain. Is on Coumadin for history of PE.  The history is provided by the patient.    Past Medical History  Diagnosis Date  . Rheumatoid arthritis   . COPD (chronic obstructive pulmonary disease)   . Disseminated herpes zoster 2010  . GERD (gastroesophageal reflux disease)   . Asthma   . Pulmonary embolus     2003 and 2011  . Pulmonary nodules      Chest CT 09/11  . Mixed hyperlipidemia   . Atrial fibrillation     CHADS2 score 2  . Otitis externa, left   . Lupus anticoagulant positive   . Hypertension   . Gall stones     Past Surgical History  Procedure Date  . Cataract extraction     bilateral  . Lumber fusion    . Bilateral l2 laminectomy     three back surgery 2003/2009/2011  . Tube in ear     both ears  . Polypectomy 02/02/2012    Procedure: POLYPECTOMY;  Surgeon: Corbin Ade, MD;  Location: [facility];  Service: Endoscopy;;    Family History  Problem Relation Age of Onset  . Colon cancer Neg Hx   . Liver disease Neg Hx     History  Substance Use Topics  . Smoking status: Former Smoker -- 1.0 packs/day for 57 years    Types: Cigarettes    Quit date: 01/27/1998  . Smokeless tobacco: Never Used  . Alcohol Use: No     quit at age 49      Review of Systems  Constitutional:  Positive for activity change and appetite change. Negative for fever.  HENT: Negative for congestion and rhinorrhea.   Respiratory: Negative for cough, chest tightness and shortness of breath.   Cardiovascular: Negative for chest pain.  Gastrointestinal: Positive for nausea and abdominal pain. Negative for vomiting and diarrhea.  Genitourinary: Negative for dysuria and hematuria.    Allergies  Cymbalta and Procaine hcl  Home Medications   Current Outpatient Rx  Name Route Sig Dispense Refill  . ALBUTEROL SULFATE HFA 108 (90 BASE) MCG/ACT IN AERS Inhalation Inhale 2 puffs into the lungs every 6 (six) hours as needed. Shortness of Breath    . ALBUTEROL SULFATE (2.5 MG/3ML) 0.083% IN NEBU Nebulization Take 2.5 mg by nebulization every 6 (six) hours as needed. Shortness of Breath    . CETIRIZINE HCL 10 MG PO TABS Oral Take 10 mg by mouth daily.      Marland Kitchen CLONIDINE HCL 0.1 MG PO TABS Oral Take 0.1 mg by mouth 2 (two) times daily.      Marland Kitchen DILTIAZEM HCL ER COATED BEADS 180 MG PO CP24 Oral Take 1 capsule (180 mg total) by mouth daily. 30 capsule 7  . ETANERCEPT 25 MG/0.5ML [place] SOLN Subcutaneous Inject 25 mg into the skin 2 (  two) times a week.      Marland Kitchen FOLIC ACID 1 MG PO TABS Oral Take 1 mg by mouth daily.     . FUROSEMIDE 40 MG PO TABS Oral Take 40 mg by mouth 2 (two) times daily.     Marland Kitchen GABAPENTIN 300 MG PO CAPS Oral Take 300 mg by mouth 3 (three) times daily.     Marland Kitchen METHOTREXATE 2.5 MG PO TABS Oral Take 17.5 mg by mouth once a week. Takes 7 tablets on Friday    . FISH OIL 1000 MG PO CAPS Oral Take 1 capsule by mouth 2 (two) times daily.     Marland Kitchen OMEPRAZOLE 20 MG PO CPDR Oral Take 20 mg by mouth 2 (two) times daily.     . OXYCODONE-ACETAMINOPHEN 10-325 MG PO TABS Oral Take 1 tablet by mouth every 8 (eight) hours as needed. Pain    . POTASSIUM CHLORIDE CRYS ER 20 MEQ PO TBCR Oral Take 20 mEq by mouth 2 (two) times daily.      . WARFARIN SODIUM 2 MG PO TABS Oral Take 2 mg by mouth daily.      BP 109/65   Pulse 119  Temp(Src) 97.8 F (36.6 C) (Oral)  Resp 20  Ht 6\' 1"  (1.854 m)  Wt 212 lb (96.163 kg)  BMI 27.97 kg/m2  SpO2 99%  Physical Exam  Constitutional: He is oriented to person, place, and time. He appears well-developed and well-nourished. No distress.  HENT:  Head: Normocephalic and atraumatic.  Mouth/Throat: Oropharynx is clear and moist. No oropharyngeal exudate.  Eyes: Conjunctivae and EOM are normal. Pupils are equal, round, and reactive to light.  Neck: Normal range of motion.  Cardiovascular: Normal rate, regular rhythm and normal heart sounds.   Pulmonary/Chest: Effort normal and breath sounds normal. No respiratory distress.  Abdominal: Soft. There is tenderness.       Diffuse tenderness, worse RUQ with voluntary guarding.  Musculoskeletal: Normal range of motion. He exhibits edema.       +1 pedal edema  Neurological: He is alert and oriented to person, place, and time. No cranial nerve deficit.  Skin: Skin is warm.    ED Course  Procedures (including critical care time)  Labs Reviewed  CBC - Abnormal; Notable for the following:    WBC 19.9 (*)    RBC 4.20 (*)    RDW 17.5 (*)    All other components within normal limits  DIFFERENTIAL - Abnormal; Notable for the following:    Neutrophils Relative 82 (*)    Neutro Abs 16.4 (*)    Lymphocytes Relative 8 (*)    Monocytes Absolute 1.6 (*)    All other components within normal limits  COMPREHENSIVE METABOLIC PANEL - Abnormal; Notable for the following:    CO2 35 (*)    Glucose, Bld 112 (*)    Total Bilirubin 2.1 (*)    GFR calc non Af Amer 76 (*)    GFR calc Af Amer 89 (*)    All other components within normal limits  PROTIME-INR - Abnormal; Notable for the following:    Prothrombin Time 17.6 (*)    All other components within normal limits  LIPASE, BLOOD  LACTIC ACID, PLASMA  URINALYSIS, ROUTINE W REFLEX MICROSCOPIC   Ct Abdomen Pelvis W Contrast  05/21/2012  *RADIOLOGY REPORT*  Clinical Data:  Abdominal pain.  Hypertension.  History of cholelithiasis.  Gastroesophageal reflux disease.  CT ABDOMEN AND PELVIS WITH CONTRAST  Technique:  Multidetector CT imaging of  the abdomen and pelvis was performed following the standard protocol during bolus administration of intravenous contrast.  Contrast: OMNIPAQUE IOHEXOL 300 MG/ML  SOLN  Comparison: Multiple exams, including 04/12/2012 and 01/24/2012  Findings: Emphysema noted at the lung bases.  Faint peripheral interstitial accentuation suggest mild fibrosis.  Coronary artery atherosclerotic calcification noted with mitral annular calcification.  Oral contrast medium in the distal esophagus is compatible with gastroesophageal reflux.  There is a small hiatal hernia.  Mild atelectasis or scarring noted in the right middle lobe. Linear opacity within the 8 mm nodular component is observed in the lingula on image 12 of series 3; the patient has had similar nodules described on prior exams including 03/29/2011 although this nodule or area of rounded atelectasis appears to be lower in position.  There was previously a greater amount of atelectasis in this location.  Scattered cysts are present in the lateral segment left hepatic lobe.  Dependent density in the gallbladder appears stable and has been previously worked up pound represent tumefactive sludge and possible small gallstones.  Old granulomatous disease of the spleen noted.  The pancreas and adrenal glands appear unremarkable by CT. Bilateral renal cysts noted.  Aortoiliac atherosclerotic calcification is present.  The appendix appears normal.  No pathologic retroperitoneal or porta hepatis adenopathy is identified.  No pathologic pelvic adenopathy is identified.  Urinary bladder appears normal.  There is degenerative arthropathy of both hips, with findings of avascular necrosis or more likely degenerative arthropathy in the right femoral head.  Chronic lumbar spine endplate compressions noted.   Posterolateral rod pedicle screw fixation noted at the L4-5 level, with grade 1 anterolisthesis of L4 on L5 and multilevel lumbar spondylosis. Mild band of sclerosis along the concave superior endplate of T11 suggests subacute compression fracture at T11.  IMPRESSION:  1.  Gastroesophageal reflux. 2.  Suspected subacute compression fracture along the superior endplate of T11. 3.  Hepatic and renal cysts. 4.  Tumefactive sludge in the gallbladder. 5.  Atherosclerosis. 6.  Degenerative arthropathy of the hips, with subcortical degenerative cysts or mild avascular necrosis in the right femoral head. 7.  8 mm nodular component of a band-like density in the lingula, possibly representing rounded atelectasis or a true pulmonary nodule.  The patient has a history of pulmonary nodules which were seen on CT scan of 03/29/2011; a follow-up CT the chest was recommended on 03/29/2011 but is not apparent in the [facility] system - follow-up CT of the chest is likely warranted if not performed at an outside institution. 8.  Emphysema with suspected mild fibrosis in the lung bases. 9.  Small hiatal hernia.  Original Report Authenticated By: Dellia Cloud, M.D.     No diagnosis found.    MDM  History of gallbladder sludge presenting with right upper quadrant pain for the past 2 days there is no nausea or vomiting. Patient denies fever, chest pain, shortness of breath. He saw Dr. Magnus Ivan one week ago and was told he may need to have surgery if he started to have more pain.  Leukocytosis of 19.9. Normal LFTs and lipase. Elevated bilirubin. Unable to obtain ultrasound at [facility].  CT shows sludge in the gallbladder.  D/w Dr. Harlon Flor who is covering for Dr. Magnus Ivan.  Recommends transfer to the ED at [facility] for surgical evaluation.  Dr. Judd Lien in [facility] ED aware.  Patient and family in agreement with transfer.        Glynn Octave, MD 05/21/12 1626  Glynn Octave, MD  06/28/12 1041 

## 2012-05-21 NOTE — ED Notes (Signed)
Dr. Judith Blonder here to assess pt.

## 2012-05-21 NOTE — ED Notes (Signed)
Pt being taken to x-ray, will return in 15-20

## 2012-05-21 NOTE — ED Notes (Signed)
MEAL TRAY ORDER BY OFF GOING RN. PATIENT AT BEDSIDE EATING

## 2012-05-21 NOTE — ED Notes (Signed)
Pt returned from xray

## 2012-05-21 NOTE — ED Notes (Signed)
Daughter [phone]

## 2012-05-21 NOTE — H&P (Signed)
PCP:   Josue Hector, MD, MD   Chief Complaint:  Abdominal pain.  HPI: Mr Irby was referred to [facility] ED for surgical evaluation for abdominal pain. He has gallbladder sludge on exam, and was seen by Dr Corliss Skains, who asked [place] to admit due to multiple medical problems, including afib on coumadin/copd/obesity/rheumatoid arthritis on immunosuppressants/lupus anticoagulant positive. His wbc was noted to be 20000 today. He continues to have abdominal pain in the right upper quadrant.  Review of Systems:  The patient denies anorexia, fever, weight loss,, vision loss, decreased hearing, hoarseness, chest pain, syncope, dyspnea on exertion, peripheral edema, balance deficits, hemoptysis, abdominal pain, melena, hematochezia, severe indigestion/heartburn, hematuria, incontinence, genital sores, muscle weakness, suspicious skin lesions, transient blindness, difficulty walking, depression, unusual weight change, abnormal bleeding, enlarged lymph nodes, angioedema, and breast masses.  Past Medical History: Past Medical History  Diagnosis Date  . Rheumatoid arthritis   . COPD (chronic obstructive pulmonary disease)   . Disseminated herpes zoster 2010  . GERD (gastroesophageal reflux disease)   . Asthma   . Pulmonary embolus     2003 and 2011  . Pulmonary nodules      Chest CT 09/11  . Mixed hyperlipidemia   . Atrial fibrillation     CHADS2 score 2  . Otitis externa, left   . Lupus anticoagulant positive   . Hypertension   . Gall stones    Past Surgical History  Procedure Date  . Cataract extraction     bilateral  . Lumber fusion    . Bilateral l2 laminectomy     three back surgery 2003/2009/2011  . Tube in ear     both ears  . Polypectomy 02/02/2012    Procedure: POLYPECTOMY;  Surgeon: Corbin Ade, MD;  Location: [facility];  Service: Endoscopy;;    Medications: Prior to Admission medications   Medication Sig Start Date End Date Taking? Authorizing Provider  albuterol (PROAIR HFA)  108 (90 BASE) MCG/ACT inhaler Inhale 2 puffs into the lungs every 6 (six) hours as needed. Shortness of Breath    Historical Provider, MD  albuterol (PROVENTIL) (2.5 MG/3ML) 0.083% nebulizer solution Take 2.5 mg by nebulization every 6 (six) hours as needed. Shortness of Breath    Historical Provider, MD  cetirizine (ZYRTEC) 10 MG tablet Take 10 mg by mouth daily.      Historical Provider, MD  cloNIDine (CATAPRES) 0.1 MG tablet Take 0.1 mg by mouth 2 (two) times daily.      Historical Provider, MD  diltiazem (CARDIZEM CD) 180 MG 24 hr capsule Take 1 capsule (180 mg total) by mouth daily. 11/28/11 11/27/12  June Leap, MD  Etanercept (ENBREL) 25 MG/0.5ML SOLN Inject 25 mg into the skin 2 (two) times a week.      Historical Provider, MD  folic acid (FOLVITE) 1 MG tablet Take 1 mg by mouth daily.     Historical Provider, MD  furosemide (LASIX) 40 MG tablet Take 40 mg by mouth 2 (two) times daily.     Historical Provider, MD  gabapentin (NEURONTIN) 300 MG capsule Take 300 mg by mouth 3 (three) times daily.     Historical Provider, MD  methotrexate (RHEUMATREX) 2.5 MG tablet Take 17.5 mg by mouth once a week. Takes 7 tablets on Friday    Historical Provider, MD  Omega-3 Fatty Acids (FISH OIL) 1000 MG CAPS Take 1 capsule by mouth 2 (two) times daily.     Historical Provider, MD  omeprazole (PRILOSEC) 20 MG capsule  Take 20 mg by mouth 2 (two) times daily.     Historical Provider, MD  oxyCODONE-acetaminophen (PERCOCET) 10-325 MG per tablet Take 1 tablet by mouth every 8 (eight) hours as needed. Pain    Historical Provider, MD  potassium chloride SA (K-DUR,KLOR-CON) 20 MEQ tablet Take 20 mEq by mouth 2 (two) times daily.      Historical Provider, MD  warfarin (COUMADIN) 2 MG tablet Take 2 mg by mouth daily. 02/03/12   Corbin Ade, MD    Allergies:   Allergies  Allergen Reactions  . Cymbalta (Duloxetine Hcl) Other (See Comments)    Confusion   . Procaine Hcl Hives    Sweating, Confusion, Not in right  state of mind.    Social History:  reports that he quit smoking about 14 years ago. His smoking use included Cigarettes. He has a 57 pack-year smoking history. He has never used smokeless tobacco. He reports that he does not drink alcohol or use illicit drugs.  Family History: Family History  Problem Relation Age of Onset  . Colon cancer Neg Hx   . Liver disease Neg Hx     Physical Exam: Filed Vitals:   05/21/12 1643 05/21/12 1814  BP: 100/68 103/66  Pulse: 121 90  Temp: 98.8 F (37.1 C) 98.6 F (37 C)  TempSrc: Oral Oral  Resp: 12 16  SpO2: 95% 94%   Jovial, not in distress. PERRLA. Lungs clear. Occasional cough. No wheezing. S1S2 heard. No murmurs. RRR. Abdopmen- obese, soft, non tender. +BS. CNS- grossly intact. Extremities- no pedal edema.   Labs on Admission:   [facility] 05/21/12 1308  NA 138  K 3.6  CL 97  CO2 35*  GLUCOSE 112*  BUN 19  CREATININE 0.99  CALCIUM 10.0  MG --  PHOS --    Basename 05/21/12 1308  AST 17  ALT 18  ALKPHOS 58  BILITOT 2.1*  PROT 7.8  ALBUMIN 3.5    Basename 05/21/12 1308  LIPASE 28  AMYLASE --    Basename 05/21/12 1308  WBC 19.9*  NEUTROABS 16.4*  HGB 13.5  HCT 40.5  MCV 96.4  PLT 293   No results found for this basename: CKTOTAL:3,CKMB:3,CKMBINDEX:3,TROPONINI:3 in the last 72 hours No results found for this basename: TSH,T4TOTAL,FREET3,T3FREE,THYROIDAB in the last 72 hours No results found for this basename: VITAMINB12:2,FOLATE:2,FERRITIN:2,TIBC:2,IRON:2,RETICCTPCT:2 in the last 72 hours  Radiological Exams on Admission: Ct Abdomen Pelvis W Contrast  05/21/2012  *RADIOLOGY REPORT*  Clinical Data: Abdominal pain.  Hypertension.  History of cholelithiasis.  Gastroesophageal reflux disease.  CT ABDOMEN AND PELVIS WITH CONTRAST  Technique:  Multidetector CT imaging of the abdomen and pelvis was performed following the standard protocol during bolus administration of intravenous contrast.  Contrast: OMNIPAQUE  IOHEXOL 300 MG/ML  SOLN  Comparison: Multiple exams, including 04/12/2012 and 01/24/2012  Findings: Emphysema noted at the lung bases.  Faint peripheral interstitial accentuation suggest mild fibrosis.  Coronary artery atherosclerotic calcification noted with mitral annular calcification.  Oral contrast medium in the distal esophagus is compatible with gastroesophageal reflux.  There is a small hiatal hernia.  Mild atelectasis or scarring noted in the right middle lobe. Linear opacity within the 8 mm nodular component is observed in the lingula on image 12 of series 3; the patient has had similar nodules described on prior exams including 03/29/2011 although this nodule or area of rounded atelectasis appears to be lower in position.  There was previously a greater amount of atelectasis in this location.  Scattered cysts are present in the lateral segment left hepatic lobe.  Dependent density in the gallbladder appears stable and has been previously worked up pound represent tumefactive sludge and possible small gallstones.  Old granulomatous disease of the spleen noted.  The pancreas and adrenal glands appear unremarkable by CT. Bilateral renal cysts noted.  Aortoiliac atherosclerotic calcification is present.  The appendix appears normal.  No pathologic retroperitoneal or porta hepatis adenopathy is identified.  No pathologic pelvic adenopathy is identified.  Urinary bladder appears normal.  There is degenerative arthropathy of both hips, with findings of avascular necrosis or more likely degenerative arthropathy in the right femoral head.  Chronic lumbar spine endplate compressions noted.  Posterolateral rod pedicle screw fixation noted at the L4-5 level, with grade 1 anterolisthesis of L4 on L5 and multilevel lumbar spondylosis. Mild band of sclerosis along the concave superior endplate of T11 suggests subacute compression fracture at T11.  IMPRESSION:  1.  Gastroesophageal reflux. 2.  Suspected subacute  compression fracture along the superior endplate of T11. 3.  Hepatic and renal cysts. 4.  Tumefactive sludge in the gallbladder. 5.  Atherosclerosis. 6.  Degenerative arthropathy of the hips, with subcortical degenerative cysts or mild avascular necrosis in the right femoral head. 7.  8 mm nodular component of a band-like density in the lingula, possibly representing rounded atelectasis or a true pulmonary nodule.  The patient has a history of pulmonary nodules which were seen on CT scan of 03/29/2011; a follow-up CT the chest was recommended on 03/29/2011 but is not apparent in the [facility] system - follow-up CT of the chest is likely warranted if not performed at an outside institution. 8.  Emphysema with suspected mild fibrosis in the lung bases. 9.  Small hiatal hernia.  Original Report Authenticated By: Dellia Cloud, M.D.    Assessment  Pleasant 78 year old male with multiple comobids, who comes in with acalculus cholecystitis, in setting of immunosuppressants for RA. He is also on coumadin for afib, which is rate controlled. He uses oxygen at night for the COPD. He had 2decho in Feb'13, which showed EF 60-65%, with grade 2 diastolic dysfunction. The elevated wbc is concerning especially as he is not on steroids.  Plan   .Acute acalculous cholecystitis- admit tele. Appreciate CCS. Will place on zosyn, UA/UC/BCx2. Ask Corinda [facility]ubler cards to help fine tune him for surgery, suspect should be ready Wednesday/thursday. Marland KitchenCOPD (chronic obstructive pulmonary disease)- seems stable, but likely to decompensate, will place on low dose solumedrol/bronchodilators/O2, obtain baseline cxr. .Chronic a-fib- continue home except coumadin, which will hold in prep for surgery. .Obesity (BMI 35.0-39.9 without comorbidity)- lifestyle changes. Marland KitchenHTN (hypertension)- controlled. .Arthritis(RA)- stable, no evidence of flare up., to continue home meds. Marland KitchenGERD- ppi. Condition closely guarded. Discussed plan of care with  patient's daughter at bedside.   Rupert Azzara [phone] 05/21/2012, 7:42 PM

## 2012-05-21 NOTE — ED Notes (Signed)
RUQ pain since Saturday, No N/V,

## 2012-05-21 NOTE — ED Notes (Signed)
Pt sent to ED from [facility] via [facility] to meet surgeon.

## 2012-05-22 ENCOUNTER — Encounter (HOSPITAL_COMMUNITY): Payer: Self-pay | Admitting: Physician Assistant

## 2012-05-22 DIAGNOSIS — I4891 Unspecified atrial fibrillation: Secondary | ICD-10-CM

## 2012-05-22 DIAGNOSIS — J438 Other emphysema: Secondary | ICD-10-CM

## 2012-05-22 DIAGNOSIS — K810 Acute cholecystitis: Secondary | ICD-10-CM

## 2012-05-22 DIAGNOSIS — Z7901 Long term (current) use of anticoagulants: Secondary | ICD-10-CM

## 2012-05-22 DIAGNOSIS — Z01810 Encounter for preprocedural cardiovascular examination: Secondary | ICD-10-CM

## 2012-05-22 LAB — DIFFERENTIAL
Eosinophils Relative: 0 % (ref 0–5)
Lymphocytes Relative: 4 % — ABNORMAL LOW (ref 12–46)
Lymphs Abs: 0.6 10*3/uL — ABNORMAL LOW (ref 0.7–4.0)
Monocytes Absolute: 0.3 10*3/uL (ref 0.1–1.0)
Neutro Abs: 15.3 10*3/uL — ABNORMAL HIGH (ref 1.7–7.7)

## 2012-05-22 LAB — CBC
HCT: 36 % — ABNORMAL LOW (ref 39.0–52.0)
MCV: 97 fL (ref 78.0–100.0)
RBC: 3.71 MIL/uL — ABNORMAL LOW (ref 4.22–5.81)
WBC: 16.1 10*3/uL — ABNORMAL HIGH (ref 4.0–10.5)

## 2012-05-22 LAB — URINALYSIS, ROUTINE W REFLEX MICROSCOPIC
Ketones, ur: NEGATIVE mg/dL
Leukocytes, UA: NEGATIVE
Nitrite: NEGATIVE
Protein, ur: NEGATIVE mg/dL
pH: 6 (ref 5.0–8.0)

## 2012-05-22 LAB — LIPID PANEL
Cholesterol: 160 mg/dL (ref 0–200)
HDL: 49 mg/dL (ref >39)
Total CHOL/HDL Ratio: 3.3 RATIO
VLDL: 20 mg/dL (ref 0–40)

## 2012-05-22 LAB — COMPREHENSIVE METABOLIC PANEL
ALT: 14 U/L (ref 0–53)
Alkaline Phosphatase: 60 U/L (ref 39–117)
CO2: 32 mEq/L (ref 19–32)
Chloride: 98 mEq/L (ref 96–112)
GFR calc Af Amer: 78 mL/min — ABNORMAL LOW (ref >90)
GFR calc non Af Amer: 67 mL/min — ABNORMAL LOW (ref >90)
Glucose, Bld: 164 mg/dL — ABNORMAL HIGH (ref 70–99)
Potassium: 3.7 mEq/L (ref 3.5–5.1)
Sodium: 139 mEq/L (ref 135–145)
Total Bilirubin: 1.6 mg/dL — ABNORMAL HIGH (ref 0.3–1.2)

## 2012-05-22 MED ORDER — POTASSIUM CHLORIDE CRYS ER 20 MEQ PO TBCR
20.0000 meq | EXTENDED_RELEASE_TABLET | Freq: Once | ORAL | Status: AC
  Administered 2012-05-22: 20 meq via ORAL

## 2012-05-22 NOTE — Progress Notes (Signed)
Subjective: Pain improved with antibiotics.    Objective: Vital signs in last 24 hours: Temp:  [97.7 F (36.5 C)-99 F (37.2 C)] 97.7 F (36.5 C) (05/28 0537) Pulse Rate:  [62-126] 62  (05/28 0537) Resp:  [12-20] 18  (05/28 0537) BP: (100-149)/(60-78) 111/60 mmHg (05/28 0537) SpO2:  [93 %-99 %] 93 % (05/28 0537) Weight:  [210 lb 14.4 oz (95.664 kg)-212 lb 4.9 oz (96.3 kg)] 210 lb 14.4 oz (95.664 kg) (05/28 0537) Last BM Date: 05/21/12  Intake/Output from previous day: 05/27 0701 - 05/28 0700 In: 349 [P.O.:240; I.V.:9; IV Piggyback:100] Out: 325 [Urine:325] Intake/Output this shift: Total I/O In: -  Out: 300 [Urine:300]  General appearance: alert, cooperative and no distress GI: soft, non distended, tender in RUQ  Lab Results:   Basename 05/22/12 0505 05/21/12 1308  WBC 16.1* 19.9*  HGB 11.9* 13.5  HCT 36.0* 40.5  PLT 243 293   BMET  Basename 05/22/12 0505 05/21/12 1308  NA 139 138  K 3.7 3.6  CL 98 97  CO2 32 35*  GLUCOSE 164* 112*  BUN 18 19  CREATININE 1.03 0.99  CALCIUM 9.2 10.0   PT/INR  Basename 05/22/12 0505 05/21/12 1308  LABPROT 16.7* 17.6*  INR 1.33 1.42   ABG No results found for this basename: PHART:2,PCO2:2,PO2:2,HCO3:2 in the last 72 hours  Studies/Results: Dg Neck Soft Tissue  05/21/2012  *RADIOLOGY REPORT*  Clinical Data: Preop for surgery.  The patient has rheumatoid arthritis.  NECK SOFT TISSUES - 1+ VIEW  Comparison: None.  Findings: No soft tissue abnormality, or displacement of the airway is seen.  There is mild disc space narrowing at C4-5, C5-6 and C6- 7.  There is no widening of the predental space at C1-C2. There is no prevertebral soft tissue swelling.  There is moderate to marked bilateral carotid calcification.  Skeletal osteopenia is suggested.  IMPRESSION: Chronic changes as described.  Original Report Authenticated By: Elsie Stain, M.D.   Dg Chest 2 View  05/21/2012  *RADIOLOGY REPORT*  Clinical Data: COPD.  Preop  gallbladder surgery.  CHEST - 2 VIEW  Comparison: 09/02/2010  Findings: Shallow inspiration.  Borderline heart size and pulmonary vascularity are likely normal for inspiratory effort. Emphysematous changes and scattered fibrosis in the lungs.  Linear atelectasis or infiltration in the right lung base.  Degenerative changes in the thoracic spine.  Calcified and tortuous aorta.  IMPRESSION: Emphysematous changes and scattered fibrosis in the lungs.  Shallow inspiration with linear atelectasis or infiltration in the right lung base.  Original Report Authenticated By: Marlon Pel, M.D.   Ct Abdomen Pelvis W Contrast  05/21/2012  *RADIOLOGY REPORT*  Clinical Data: Abdominal pain.  Hypertension.  History of cholelithiasis.  Gastroesophageal reflux disease.  CT ABDOMEN AND PELVIS WITH CONTRAST  Technique:  Multidetector CT imaging of the abdomen and pelvis was performed following the standard protocol during bolus administration of intravenous contrast.  Contrast: OMNIPAQUE IOHEXOL 300 MG/ML  SOLN  Comparison: Multiple exams, including 04/12/2012 and 01/24/2012  Findings: Emphysema noted at the lung bases.  Faint peripheral interstitial accentuation suggest mild fibrosis.  Coronary artery atherosclerotic calcification noted with mitral annular calcification.  Oral contrast medium in the distal esophagus is compatible with gastroesophageal reflux.  There is a small hiatal hernia.  Mild atelectasis or scarring noted in the right middle lobe. Linear opacity within the 8 mm nodular component is observed in the lingula on image 12 of series 3; the patient has had similar nodules described on  prior exams including 03/29/2011 although this nodule or area of rounded atelectasis appears to be lower in position.  There was previously a greater amount of atelectasis in this location.  Scattered cysts are present in the lateral segment left hepatic lobe.  Dependent density in the gallbladder appears stable and has been  previously worked up pound represent tumefactive sludge and possible small gallstones.  Old granulomatous disease of the spleen noted.  The pancreas and adrenal glands appear unremarkable by CT. Bilateral renal cysts noted.  Aortoiliac atherosclerotic calcification is present.  The appendix appears normal.  No pathologic retroperitoneal or porta hepatis adenopathy is identified.  No pathologic pelvic adenopathy is identified.  Urinary bladder appears normal.  There is degenerative arthropathy of both hips, with findings of avascular necrosis or more likely degenerative arthropathy in the right femoral head.  Chronic lumbar spine endplate compressions noted.  Posterolateral rod pedicle screw fixation noted at the L4-5 level, with grade 1 anterolisthesis of L4 on L5 and multilevel lumbar spondylosis. Mild band of sclerosis along the concave superior endplate of T11 suggests subacute compression fracture at T11.  IMPRESSION:  1.  Gastroesophageal reflux. 2.  Suspected subacute compression fracture along the superior endplate of T11. 3.  Hepatic and renal cysts. 4.  Tumefactive sludge in the gallbladder. 5.  Atherosclerosis. 6.  Degenerative arthropathy of the hips, with subcortical degenerative cysts or mild avascular necrosis in the right femoral head. 7.  8 mm nodular component of a band-like density in the lingula, possibly representing rounded atelectasis or a true pulmonary nodule.  The patient has a history of pulmonary nodules which were seen on CT scan of 03/29/2011; a follow-up CT the chest was recommended on 03/29/2011 but is not apparent in the [facility] system - follow-up CT of the chest is likely warranted if not performed at an outside institution. 8.  Emphysema with suspected mild fibrosis in the lung bases. 9.  Small hiatal hernia.  Original Report Authenticated By: Dellia Cloud, M.D.    Anti-infectives: Anti-infectives     Start     Dose/Rate Route Frequency Ordered Stop   05/21/12 2200    piperacillin-tazobactam (ZOSYN) IVPB 3.375 g        3.375 g 100 mL/hr over 30 Minutes Intravenous 3 times per day 05/21/12 2130            Assessment/Plan: s/p * No surgery found * Chronic cholecystitis exacerbated by fatty food intake this weekend. Continue antibiotics. Await clearance by medical team. Pt at high risk for surgical complications due to COPD, immunosuppression with Enbrel, MTX, steroids, probable component of cirrhosis secondary to EtOH use.  Also on anticoagulation for PE/DVT/lupus anticoagulant.  LOS: 1 day    [facility] 05/22/2012

## 2012-05-22 NOTE — Progress Notes (Signed)
[name] is a pleasant 78 y.o. male admitted with RUQ pain, and has gallbladder sludge, likely infected as wbc 20000 at admission. Appreciate cardiology/surgery input. He could proceed with surgery from a medical stand point, understanding he is a high risk candidate for medium risk procedure, and the benefits seem to outweigh the risks of proceeding with surgery. I have explained this to Mr Beougher and his daughter who is at his bed side.  SUBJECTIVE Feels better. Says pain is controlled.   1. Atrial fibrillation     Past Medical History  Diagnosis Date  . COPD (chronic obstructive pulmonary disease)     Uses occasional nighttime O2  . Disseminated herpes zoster 2010  . GERD (gastroesophageal reflux disease)   . Asthma   . Pulmonary embolus     2003 and 2011  . Pulmonary nodules      Chest CT 09/11  . Mixed hyperlipidemia   . Atrial fibrillation     CHADS2 score 2  . Lupus anticoagulant positive   . Hypertension   . Gall stones   . Rheumatoid arthritis   . History of chicken pox 1941; 2011  . Anemia   . Chronic lower back pain   . Edema     Evaluated by Dr. Diona Browner - felt more likely component of cor pulmonale due to chronic lung disease (EF  60-65%, grade 2 diastolic dysfunction by echo 01/2012)   Current Facility-Administered Medications  Medication Dose Route Frequency Provider Last Rate Last Dose  . acetaminophen (TYLENOL) tablet 650 mg  650 mg Oral Q6H PRN Cagney Degrace, MD   650 mg at 05/22/12 1719   Or  . acetaminophen (TYLENOL) suppository 650 mg  650 mg Rectal Q6H PRN Jazleen Robeck, MD      . albuterol (PROVENTIL) (5 MG/ML) 0.5% nebulizer solution 2.5 mg  2.5 mg Nebulization Q2H PRN Jaicey Sweaney, MD      . cloNIDine (CATAPRES) tablet 0.1 mg  0.1 mg Oral BID Darlisa Spruiell, MD   0.1 mg at 05/22/12 1610  . diltiazem (CARDIZEM CD) 24 hr capsule 180 mg  180 mg Oral Daily Io Dieujuste, MD   180 mg at 05/22/12 9604  . Etanercept SOLN 25 mg  25 mg Subcutaneous 2 times  weekly Sharman Garrott, MD      . folic acid (FOLVITE) tablet 1 mg  1 mg Oral Daily Demarrion Meiklejohn, MD   1 mg at 05/22/12 5409  . furosemide (LASIX) tablet 40 mg  40 mg Oral BID Calixto Pavel, MD   40 mg at 05/22/12 8119  . gabapentin (NEURONTIN) capsule 300 mg  300 mg Oral TID Aristide Waggle, MD   300 mg at 05/22/12 1630  . HYDROcodone-acetaminophen (NORCO) 5-325 MG per tablet 1-2 tablet  1-2 tablet Oral Q4H PRN Varnell Orvis, MD      . loratadine (CLARITIN) tablet 10 mg  10 mg Oral Daily Malone Admire, MD   10 mg at 05/22/12 1478  . methotrexate (RHEUMATREX) tablet 17.5 mg  17.5 mg Oral Weekly Evie Croston, MD      . methylPREDNISolone sodium succinate (SOLU-MEDROL) 40 mg/mL injection 40 mg  40 mg Intravenous Daily Laniqua Torrens, MD   40 mg at 05/22/12 0923  . morphine 4 MG/ML injection 4 mg  4 mg Intravenous Once Browning Southwood, MD   4 mg at 05/21/12 1951  . morphine 4 MG/ML injection 4 mg  4 mg Intravenous Q4H PRN Halana Deisher, MD      . ondansetron (  ZOFRAN) tablet 4 mg  4 mg Oral Q6H PRN Tanishia Lemaster, MD       Or  . ondansetron (ZOFRAN) injection 4 mg  4 mg Intravenous Q6H PRN Merlene Dante, MD      . pantoprazole (PROTONIX) EC tablet 40 mg  40 mg Oral Q1200 Domenic Schoenberger, MD   40 mg at 05/22/12 0925  . piperacillin-tazobactam (ZOSYN) IVPB 3.375 g  3.375 g Intravenous Q8H Ayinde Swim, MD   3.375 g at 05/22/12 1303  . potassium chloride SA (K-DUR,KLOR-CON) CR tablet 20 mEq  20 mEq Oral BID Jarret Torre, MD   20 mEq at 05/22/12 0922  . potassium chloride SA (K-DUR,KLOR-CON) CR tablet 20 mEq  20 mEq Oral Once [employer], PA   20 mEq at 05/22/12 1245  . senna (SENOKOT) tablet 8.6 mg  1 tablet Oral BID Katilynn Sinkler, MD   8.6 mg at 05/22/12 0922  . sodium chloride 0.9 % injection 3 mL  3 mL Intravenous Q12H Amy Gothard, MD   3 mL at 05/22/12 0620  . zolpidem (AMBIEN) tablet 5 mg  5 mg Oral QHS PRN Zenda Herskowitz, MD      . DISCONTD: albuterol (PROVENTIL) (5 MG/ML) 0.5% nebulizer solution 2.5  mg  2.5 mg Nebulization TID Nataki Mccrumb, MD   2.5 mg at 05/22/12 0926   Allergies  Allergen Reactions  . Cymbalta (Duloxetine Hcl) Other (See Comments)    Confusion   . Procaine Hcl Hives and Other (See Comments)    Sweating, Confusion, Not in right state of mind.   Active Problems:  Atrial fibrillation  Lupus anticoagulant positive  Acute acalculous cholecystitis  COPD (chronic obstructive pulmonary disease)  Long term current use of anticoagulant  Obesity (BMI 35.0-39.9 without comorbidity)  HTN (hypertension)  Arthritis   Vital signs in last 24 hours: Temp:  [97.3 F (36.3 C)-99 F (37.2 C)] 97.3 F (36.3 C) (05/28 1339) Pulse Rate:  [55-94] 55  (05/28 1339) Resp:  [16-18] 18  (05/28 1339) BP: (101-149)/(52-78) 101/52 mmHg (05/28 1339) SpO2:  [93 %-98 %] 95 % (05/28 1339) Weight:  [95.664 kg (210 lb 14.4 oz)-96.3 kg (212 lb 4.9 oz)] 95.664 kg (210 lb 14.4 oz) (05/28 0537) Weight change:  Last BM Date: 05/21/12  Intake/Output from previous day: 05/27 0701 - 05/28 0700 In: 349 [P.O.:240; I.V.:9; IV Piggyback:100] Out: 325 [Urine:325] Intake/Output this shift: Total I/O In: 650 [P.O.:600; IV Piggyback:50] Out: 995 [Urine:995]  Lab Results:  Basename 05/22/12 0505 05/21/12 1308  WBC 16.1* 19.9*  HGB 11.9* 13.5  HCT 36.0* 40.5  PLT 243 293   BMET  Basename 05/22/12 0505 05/21/12 1308  NA 139 138  K 3.7 3.6  CL 98 97  CO2 32 35*  GLUCOSE 164* 112*  BUN 18 19  CREATININE 1.03 0.99  CALCIUM 9.2 10.0    Studies/Results: Dg Neck Soft Tissue  05/21/2012  *RADIOLOGY REPORT*  Clinical Data: Preop for surgery.  The patient has rheumatoid arthritis.  NECK SOFT TISSUES - 1+ VIEW  Comparison: None.  Findings: No soft tissue abnormality, or displacement of the airway is seen.  There is mild disc space narrowing at C4-5, C5-6 and C6- 7.  There is no widening of the predental space at C1-C2. There is no prevertebral soft tissue swelling.  There is moderate to marked  bilateral carotid calcification.  Skeletal osteopenia is suggested.  IMPRESSION: Chronic changes as described.  Original Report Authenticated By: Elsie Stain, M.D.   Dg Chest  2 View  05/21/2012  *RADIOLOGY REPORT*  Clinical Data: COPD.  Preop gallbladder surgery.  CHEST - 2 VIEW  Comparison: 09/02/2010  Findings: Shallow inspiration.  Borderline heart size and pulmonary vascularity are likely normal for inspiratory effort. Emphysematous changes and scattered fibrosis in the lungs.  Linear atelectasis or infiltration in the right lung base.  Degenerative changes in the thoracic spine.  Calcified and tortuous aorta.  IMPRESSION: Emphysematous changes and scattered fibrosis in the lungs.  Shallow inspiration with linear atelectasis or infiltration in the right lung base.  Original Report Authenticated By: Marlon Pel, M.D.   Ct Abdomen Pelvis W Contrast  05/21/2012  *RADIOLOGY REPORT*  Clinical Data: Abdominal pain.  Hypertension.  History of cholelithiasis.  Gastroesophageal reflux disease.  CT ABDOMEN AND PELVIS WITH CONTRAST  Technique:  Multidetector CT imaging of the abdomen and pelvis was performed following the standard protocol during bolus administration of intravenous contrast.  Contrast: OMNIPAQUE IOHEXOL 300 MG/ML  SOLN  Comparison: Multiple exams, including 04/12/2012 and 01/24/2012  Findings: Emphysema noted at the lung bases.  Faint peripheral interstitial accentuation suggest mild fibrosis.  Coronary artery atherosclerotic calcification noted with mitral annular calcification.  Oral contrast medium in the distal esophagus is compatible with gastroesophageal reflux.  There is a small hiatal hernia.  Mild atelectasis or scarring noted in the right middle lobe. Linear opacity within the 8 mm nodular component is observed in the lingula on image 12 of series 3; the patient has had similar nodules described on prior exams including 03/29/2011 although this nodule or area of rounded  atelectasis appears to be lower in position.  There was previously a greater amount of atelectasis in this location.  Scattered cysts are present in the lateral segment left hepatic lobe.  Dependent density in the gallbladder appears stable and has been previously worked up pound represent tumefactive sludge and possible small gallstones.  Old granulomatous disease of the spleen noted.  The pancreas and adrenal glands appear unremarkable by CT. Bilateral renal cysts noted.  Aortoiliac atherosclerotic calcification is present.  The appendix appears normal.  No pathologic retroperitoneal or porta hepatis adenopathy is identified.  No pathologic pelvic adenopathy is identified.  Urinary bladder appears normal.  There is degenerative arthropathy of both hips, with findings of avascular necrosis or more likely degenerative arthropathy in the right femoral head.  Chronic lumbar spine endplate compressions noted.  Posterolateral rod pedicle screw fixation noted at the L4-5 level, with grade 1 anterolisthesis of L4 on L5 and multilevel lumbar spondylosis. Mild band of sclerosis along the concave superior endplate of T11 suggests subacute compression fracture at T11.  IMPRESSION:  1.  Gastroesophageal reflux. 2.  Suspected subacute compression fracture along the superior endplate of T11. 3.  Hepatic and renal cysts. 4.  Tumefactive sludge in the gallbladder. 5.  Atherosclerosis. 6.  Degenerative arthropathy of the hips, with subcortical degenerative cysts or mild avascular necrosis in the right femoral head. 7.  8 mm nodular component of a band-like density in the lingula, possibly representing rounded atelectasis or a true pulmonary nodule.  The patient has a history of pulmonary nodules which were seen on CT scan of 03/29/2011; a follow-up CT the chest was recommended on 03/29/2011 but is not apparent in the [facility] system - follow-up CT of the chest is likely warranted if not performed at an outside institution. 8.   Emphysema with suspected mild fibrosis in the lung bases. 9.  Small hiatal hernia.  Original Report Authenticated By:  Dellia Cloud, M.D.    Medications: I have reviewed the patient's current medications.   Physical exam GENERAL- alert HEAD- normal atraumatic, no neck masses, normal thyroid, no jvd RESPIRATORY- appears well, vitals normal, no respiratory distress, acyanotic, normal RR, ear and throat exam is normal, neck free of mass or lymphadenopathy, chest clear, no wheezing, crepitations, rhonchi, normal symmetric air entry CVS- regular rate and rhythm, S1, S2 normal, no murmur, click, rub or gallop ABDOMEN- abdomen is soft without significant tenderness, masses, organomegaly or guarding NEURO- Grossly normal EXTREMITIES- extremities normal, atraumatic, no cyanosis or edema  Plan   .Acute acalculous cholecystitis- admit tele. Appreciate CCS. Wbc 16000 today. Continue zosyn. Follow septic work up. Marland KitchenCOPD (chronic obstructive pulmonary disease)- seems stable, but likely to decompensate, continue low dose solumedrol/bronchodilators/O2. .Chronic a-fib- continue home meds except coumadin, which is hold in prep for surgery. INR 1.33 today. Resume anticoagulation when it is ok with surgery. .Obesity (BMI 35.0-39.9 without comorbidity)- lifestyle changes. Marland KitchenHTN (hypertension)- controlled. .Arthritis(RA)- stable, no evidence of flare up., to continue home meds.  Marland KitchenGERD- ppi.  Condition closely guarded. Discussed plan of care with patient's daughter at bedside.         Adreona Brand 05/22/2012 5:42 PM Pager: [phone].

## 2012-05-22 NOTE — Clinical Social Work Psychosocial (Signed)
     Clinical Social Work Department BRIEF PSYCHOSOCIAL ASSESSMENT 05/22/2012  Patient:  [name], [name]     Account Number:  [id number]     Admit date:  05/21/2012  Clinical Social Worker:  Hulan Fray  Date/Time:  05/22/2012 03:44 PM  Referred by:  RN  Date Referred:  05/21/2012 Referred for  Advanced Directives   Other Referral:   Interview type:  Family Other interview type:   Daughters [name] and [name]    PSYCHOSOCIAL DATA Living Status:  WIFE Admitted from facility:   Level of care:   Primary support name:  Elease Hashimoto Primary support relationship to patient:  SPOUSE Degree of support available:   supportive    CURRENT CONCERNS Current Concerns  None Noted   Other Concerns:    SOCIAL WORK ASSESSMENT / PLAN Clinical Social Worker received referral for request for advance directive. Patient was asleep during visit. Patient's daughters [name] and Babette Relic were in room. Per [name], the patient did not say that he had an advance directive, but does not think he requested the documents. Per [name], she requested CSW leave packet and MOST form  in room. CSW explained the forms to the family and informed family that the form will need to be completed by patient. Family did not have any more questions. CSW explained that documents can be notarized during hospital stay if they are completed. CSW will sign off as social work intervention is no longer needed.   Assessment/plan status:  No Further Intervention Required Other assessment/ plan:   Information/referral to community resources:   Engineer, production and MOST form    PATIENTS/FAMILYS RESPONSE TO PLAN OF CARE: Family were appreciative of CSW's assistance.

## 2012-05-22 NOTE — Consult Note (Signed)
CARDIOLOGY CONSULT NOTE  Patient ID: [name], MRN: [id number], DOB/AGE: 76/23/1932 76 y.o. Admit date: 05/21/2012   Date of Consult: 05/22/2012 Primary Physician: Josue Hector, MD, MD Primary Cardiologist: Dr. Diona Browner  Chief Complaint: abdominal pain Reason for Consult: pre-op eval for cholecystectomy  HPI: 78 y/o M with hx of atrial fib on Coumadin, pulmonary emboli (lupus anticoag positive), COPD, RA presented to [facility] with complaints of abdominal pain. He has also been evaluated by Dr. Diona Browner in the past for LEE felt possibly due to diastolic dysfunction and/or cor pulmonale. He presented initially to [facility] with complaints of RUQ pain after eating cheese without nausea or vomiting. He had recently had OP workup demonstrating thick sludge in the gallbladder with some very small gallstones. LFTs have been elevated. WBC was 19k on admission and he has since been started on antibiotics. General Surgery has evaluated him and feels he would benefit from lap cholecystectomy and we are asked to see for pre-op clearance. From a cardiac standpoint, the patient has been doing well. He reports that his palpitations are very rare, almost once per year. Although he does not exert himself on a regular basis due to back problems, he denies any CP, DOE with activity. He denies orthopnea, recent LEE, or PND. He occasionally has to use a neb at home for wheezing when his breathing feels tight which eases his symptoms.  Past Medical History  Diagnosis Date  . COPD (chronic obstructive pulmonary disease)     Uses occasional nighttime O2  . Disseminated herpes zoster 2010  . GERD (gastroesophageal reflux disease)   . Asthma   . Pulmonary embolus     2003 and 2011  . Pulmonary nodules      Chest CT 09/11  . Mixed hyperlipidemia   . Atrial fibrillation     CHADS2 score 2  . Lupus anticoagulant positive   . Hypertension   . Gall stones   . Rheumatoid arthritis   . History  of chicken pox 1941; 2011  . Anemia   . Chronic lower back pain   . Edema     Evaluated by Dr. Diona Browner - felt more likely component of cor pulmonale due to chronic lung disease (EF  60-65%, grade 2 diastolic dysfunction by echo 01/2012)      Most Recent Cardiac Studies: 2D Echo 02/15/12 Study Conclusions - Left ventricle: The cavity size was normal. Ramadan Couey thickness was increased in a pattern of mild LVH. Systolic function was normal. The estimated ejection fraction was in the range of 60% to 65%. Bolton Canupp motion was normal; there were no regional Fuller Makin motion abnormalities. Features are consistent with a pseudonormal left ventricular filling pattern, with concomitant abnormal relaxation and increased filling pressure (grade 2 diastolic dysfunction). Doppler parameters are consistent with elevated mean left atrial filling pressure. - Aortic valve:Mildly thickened leaflets. Valve area: 2.25cm^2(VTI). Valve area: 1.98cm^2 (Vmax). - Mitral valve: Trivial regurgitation. Moderately calcified annulus. Moderately thickened, mildly calcified leaflets . Valve area by continuity equation (using LVOT flow): 1.99cm^2. - Atrial septum: No defect or patent foramen ovale was identified. Left atrium: The atrium was at the upper limits of normal in size. - Tricuspid valve: Mildly thickened leaflets. Doppler:Mild regurgitation. Pulmonary artery: Poorly visualized.   Surgical History:  Past Surgical History  Procedure Date  . Polypectomy 02/02/2012    Procedure: POLYPECTOMY;  Surgeon: Corbin Ade, MD;  Location: [facility];  Service: Endoscopy;;  . Posterior lumbar vertebrae excision 2003; 2009; 2011  .  Myringotomy     "3 times; both ears"  . Cataract extraction w/ intraocular lens  implant, bilateral      Home Meds: Prior to Admission medications   Medication Sig Start Date End Date Taking? Authorizing Provider  albuterol (PROAIR HFA) 108 (90 BASE) MCG/ACT inhaler Inhale 2 puffs into the lungs every 6 (six)  hours as needed. Shortness of Breath    Historical Provider, MD  albuterol (PROVENTIL) (2.5 MG/3ML) 0.083% nebulizer solution Take 2.5 mg by nebulization every 6 (six) hours as needed. Shortness of Breath    Historical Provider, MD  cetirizine (ZYRTEC) 10 MG tablet Take 10 mg by mouth daily.      Historical Provider, MD  cloNIDine (CATAPRES) 0.1 MG tablet Take 0.1 mg by mouth 2 (two) times daily.      Historical Provider, MD  diltiazem (CARDIZEM CD) 180 MG 24 hr capsule Take 1 capsule (180 mg total) by mouth daily. 11/28/11 11/27/12  June Leap, MD  Etanercept (ENBREL) 25 MG/0.5ML SOLN Inject 25 mg into the skin 2 (two) times a week.     Historical Provider, MD  folic acid (FOLVITE) 1 MG tablet Take 1 mg by mouth daily.     Historical Provider, MD  furosemide (LASIX) 40 MG tablet Take 40 mg by mouth 2 (two) times daily.     Historical Provider, MD  gabapentin (NEURONTIN) 300 MG capsule Take 300 mg by mouth 3 (three) times daily.     Historical Provider, MD  methotrexate (RHEUMATREX) 2.5 MG tablet Take 17.5 mg by mouth once a week. Takes 7 tablets on Friday    Historical Provider, MD  Omega-3 Fatty Acids (FISH OIL) 1000 MG CAPS Take 1 capsule by mouth 2 (two) times daily.     Historical Provider, MD  omeprazole (PRILOSEC) 20 MG capsule Take 20 mg by mouth 2 (two) times daily.     Historical Provider, MD  oxyCODONE-acetaminophen (PERCOCET) 10-325 MG per tablet Take 1 tablet by mouth every 8 (eight) hours as needed. Pain    Historical Provider, MD  potassium chloride SA (K-DUR,KLOR-CON) 20 MEQ tablet Take 20 mEq by mouth 2 (two) times daily.      Historical Provider, MD  warfarin (COUMADIN) 2 MG tablet Take 2 mg by mouth daily. 02/03/12   Corbin Ade, MD    Inpatient Medications:     . albuterol  2.5 mg Nebulization TID  . cloNIDine  0.1 mg Oral BID  . diltiazem  180 mg Oral Daily  . Etanercept  25 mg Subcutaneous 2 times weekly  . folic acid  1 mg Oral Daily  . furosemide  40 mg Oral BID  .  gabapentin  300 mg Oral TID  . loratadine  10 mg Oral Daily  . methotrexate  17.5 mg Oral Weekly  . methylPREDNISolone (SOLU-MEDROL) injection  40 mg Intravenous Daily  .  morphine injection  4 mg Intravenous Once  . pantoprazole  40 mg Oral Q1200  . piperacillin-tazobactam  3.375 g Intravenous Q8H  . potassium chloride SA  20 mEq Oral BID  . senna  1 tablet Oral BID  . sodium chloride  3 mL Intravenous Q12H    Allergies:  Allergies  Allergen Reactions  . Cymbalta (Duloxetine Hcl) Other (See Comments)    Confusion   . Procaine Hcl Hives and Other (See Comments)    Sweating, Confusion, Not in right state of mind.    History   Social History  . Marital Status: Married  Spouse Name: N/A    Number of Children: 6  . Years of Education: N/A   Occupational History  . retired    Social History Main Topics  . Smoking status: Former Smoker -- 1.0 packs/day for 57 years    Types: Cigarettes    Quit date: 01/27/1998  . Smokeless tobacco: Never Used  . Alcohol Use: Yes     "quit drinking 1967"  . Drug Use: No  . Sexually Active: Not Currently   Other Topics Concern  . Not on file   Social History Narrative  . No narrative on file     Family History  Problem Relation Age of Onset  . Colon cancer Neg Hx   . Liver disease Neg Hx      Review of Systems: General: negative for chills, fever. +had night sweats last night Cardiovascular: see above Dermatological: negative for rash Respiratory: negative for cough or wheezing Urologic: negative for hematuria Abdominal: negative for nausea, vomiting, diarrhea, bright red blood per rectum, melena, or hematemesis Neurologic: negative for visual changes, syncope, or dizziness All other systems reviewed and are otherwise negative except as noted above.  Labs:  Lab Results  Component Value Date   WBC 16.1* 05/22/2012   HGB 11.9* 05/22/2012   HCT 36.0* 05/22/2012   MCV 97.0 05/22/2012   PLT 243 05/22/2012     Lab 05/22/12  0505  NA 139  K 3.7  CL 98  CO2 32  BUN 18  CREATININE 1.03  CALCIUM 9.2  PROT 6.8  BILITOT 1.6*  ALKPHOS 60  ALT 14  AST 14  GLUCOSE 164*   Lab Results  Component Value Date   CHOL 160 05/22/2012   HDL 49 05/22/2012   LDLCALC 91 05/22/2012   TRIG 98 05/22/2012   Radiology/Studies:  1. Neck Soft Tissue 05/21/2012  *RADIOLOGY REPORT*  Clinical Data: Preop for surgery.  The patient has rheumatoid arthritis.  NECK SOFT TISSUES - 1+ VIEW  Comparison: None.  Findings: No soft tissue abnormality, or displacement of the airway is seen.  There is mild disc space narrowing at C4-5, C5-6 and C6- 7.  There is no widening of the predental space at C1-C2. There is no prevertebral soft tissue swelling.  There is moderate to marked bilateral carotid calcification.  Skeletal osteopenia is suggested.  IMPRESSION: Chronic changes as described.  Original Report Authenticated By: Elsie Stain, M.D.   2. Chest 2 View 05/21/2012  *RADIOLOGY REPORT*  Clinical Data: COPD.  Preop gallbladder surgery.  CHEST - 2 VIEW  Comparison: 09/02/2010  Findings: Shallow inspiration.  Borderline heart size and pulmonary vascularity are likely normal for inspiratory effort. Emphysematous changes and scattered fibrosis in the lungs.  Linear atelectasis or infiltration in the right lung base.  Degenerative changes in the thoracic spine.  Calcified and tortuous aorta.  IMPRESSION: Emphysematous changes and scattered fibrosis in the lungs.  Shallow inspiration with linear atelectasis or infiltration in the right lung base.  Original Report Authenticated By: Marlon Pel, M.D.   3. Ct Abdomen Pelvis W Contrast 05/21/2012  *RADIOLOGY REPORT*  Clinical Data: Abdominal pain.  Hypertension.  History of cholelithiasis.  Gastroesophageal reflux disease.  CT ABDOMEN AND PELVIS WITH CONTRAST  Technique:  Multidetector CT imaging of the abdomen and pelvis was performed following the standard protocol during bolus administration of  intravenous contrast.  Contrast: OMNIPAQUE IOHEXOL 300 MG/ML  SOLN  Comparison: Multiple exams, including 04/12/2012 and 01/24/2012  Findings: Emphysema noted at the lung  bases.  Faint peripheral interstitial accentuation suggest mild fibrosis.  Coronary artery atherosclerotic calcification noted with mitral annular calcification.  Oral contrast medium in the distal esophagus is compatible with gastroesophageal reflux.  There is a small hiatal hernia.  Mild atelectasis or scarring noted in the right middle lobe. Linear opacity within the 8 mm nodular component is observed in the lingula on image 12 of series 3; the patient has had similar nodules described on prior exams including 03/29/2011 although this nodule or area of rounded atelectasis appears to be lower in position.  There was previously a greater amount of atelectasis in this location.  Scattered cysts are present in the lateral segment left hepatic lobe.  Dependent density in the gallbladder appears stable and has been previously worked up pound represent tumefactive sludge and possible small gallstones.  Old granulomatous disease of the spleen noted.  The pancreas and adrenal glands appear unremarkable by CT. Bilateral renal cysts noted.  Aortoiliac atherosclerotic calcification is present.  The appendix appears normal.  No pathologic retroperitoneal or porta hepatis adenopathy is identified.  No pathologic pelvic adenopathy is identified.  Urinary bladder appears normal.  There is degenerative arthropathy of both hips, with findings of avascular necrosis or more likely degenerative arthropathy in the right femoral head.  Chronic lumbar spine endplate compressions noted.  Posterolateral rod pedicle screw fixation noted at the L4-5 level, with grade 1 anterolisthesis of L4 on L5 and multilevel lumbar spondylosis. Mild band of sclerosis along the concave superior endplate of T11 suggests subacute compression fracture at T11.  IMPRESSION:  1.   Gastroesophageal reflux. 2.  Suspected subacute compression fracture along the superior endplate of T11. 3.  Hepatic and renal cysts. 4.  Tumefactive sludge in the gallbladder. 5.  Atherosclerosis. 6.  Degenerative arthropathy of the hips, with subcortical degenerative cysts or mild avascular necrosis in the right femoral head. 7.  8 mm nodular component of a band-like density in the lingula, possibly representing rounded atelectasis or a true pulmonary nodule.  The patient has a history of pulmonary nodules which were seen on CT scan of 03/29/2011; a follow-up CT the chest was recommended on 03/29/2011 but is not apparent in the [facility] system - follow-up CT of the chest is likely warranted if not performed at an outside institution. 8.  Emphysema with suspected mild fibrosis in the lung bases. 9.  Small hiatal hernia.  Original Report Authenticated By: Dellia Cloud, M.D.   EKG: NSR 65bpm left axis deviation, low voltage, QTC somewhat prolonged at 497. Compared to EKG 01/2012, QRS more upright in II, V6.  Physical Exam: Blood pressure 108/61, pulse 62, temperature 97.7 F (36.5 C), temperature source Oral, resp. rate 18, height 6\' 1"  (1.854 m), weight 210 lb 14.4 oz (95.664 kg), SpO2 93.00%. General: Well developed elderly WM n no acute distress. Head: Normocephalic, atraumatic, sclera non-icteric, no xanthomas, nares are without discharge. Long beard. Neck: Negative for carotid bruits. JVD not elevated. Lungs: Coarse but clear. Rare end expiratory wheeze. No rhonchi or rales. Breathing is unlabored. Heart: RRR with S1 S2. No murmurs, rubs, or gallops appreciated. Abdomen: Soft, non-tender, non-distended with normoactive bowel sounds. No hepatomegaly. No rebound/guarding. No obvious abdominal masses. Msk:  Strength and tone appear normal for age. Extremities: No clubbing or cyanosis. No edema.  Distal pedal pulses are 2+ and equal bilaterally. Neuro: Alert and oriented X 3. Moves all  extremities spontaneously. Psych:  Responds to questions appropriately with a normal affect.   Assessment and  Plan:   1. Symptomatic gallbladder disease / pre-op assessment: no hx of CAD, not symptomatic from cardiac standpoint. Recent 2D echo 01/2012 with normal EF and no WMA. Given his other comorbidities he is at least at moderate risk for surgery, but from cardiac standpoint, he is cleared. 2. Prior atrial fibrillation: Currently maintaining NSR. Coumadin is on hold for surgery. Continue diltiazem. Would not be surprised if he had peri-operative atrial fibrillation for which we would be inclined to tx with diltiazem rather than BB given COPD. Resume anticoagulation when felt appropriate post-surgery.  3. QTc prolongation: QTc is mildly elevated compared to prior. He is not on any immediately apparent meds causing this. Will give an additional of KCl on top of usual supplementation this AM to try to keep K>4. Mg is normal. 4. H/o pulmonary emboli with +lupus anticoagulant: INR subtherapeutic on admission, will need close f/u at discharge. Will defer to primary team/surgery the timing of overlap with Lovenox as appropriate. 5. H/o LEE felt partly due to diastolic dysfunction +/- cor pulmonale: euvolemic at present. Continue Lasix. Watch volume.  6. COPD: per primary team. 7. Pulmonary nodules on prior CT: we discussed this. Mr. Justo was alrady planning on calling to schedule his f/u CT chest scan for July because he got a notice in the mail (he is aware he was due 03/2012).   Signed, Dayna Dunn PA-C 05/22/2012, 10:09 AM   I have taken a history, reviewed medications, allergies, PMH, SH, FH, and reviewed ROS and examined the patient.  I agree with the assessment and plan. I do not feel his QT is prolonged.  Caroleann Casler C. Daleen Squibb, MD, [facility] Pager:  [phone]

## 2012-05-23 ENCOUNTER — Encounter (HOSPITAL_COMMUNITY): Payer: Self-pay | Admitting: Anesthesiology

## 2012-05-23 ENCOUNTER — Encounter (HOSPITAL_COMMUNITY)
Admission: EM | Disposition: A | Discharge status: Home / Self Care | Payer: Self-pay | Source: Home / Self Care | Attending: Internal Medicine

## 2012-05-23 ENCOUNTER — Inpatient Hospital Stay (HOSPITAL_COMMUNITY): Payer: Medicare PPO | Admitting: Anesthesiology

## 2012-05-23 ENCOUNTER — Inpatient Hospital Stay (HOSPITAL_COMMUNITY): Payer: Medicare PPO

## 2012-05-23 DIAGNOSIS — J438 Other emphysema: Secondary | ICD-10-CM

## 2012-05-23 DIAGNOSIS — K810 Acute cholecystitis: Secondary | ICD-10-CM

## 2012-05-23 DIAGNOSIS — I4891 Unspecified atrial fibrillation: Secondary | ICD-10-CM

## 2012-05-23 DIAGNOSIS — Z7901 Long term (current) use of anticoagulants: Secondary | ICD-10-CM

## 2012-05-23 HISTORY — PX: CHOLECYSTECTOMY: SHX55

## 2012-05-23 LAB — BASIC METABOLIC PANEL
BUN: 25 mg/dL — ABNORMAL HIGH (ref 6–23)
Calcium: 9.5 mg/dL (ref 8.4–10.5)
GFR calc Af Amer: 61 mL/min — ABNORMAL LOW (ref >90)
GFR calc non Af Amer: 53 mL/min — ABNORMAL LOW (ref >90)
Potassium: 3.9 mEq/L (ref 3.5–5.1)
Sodium: 139 mEq/L (ref 135–145)

## 2012-05-23 LAB — PROTIME-INR
INR: 1.21 (ref 0.00–1.49)
Prothrombin Time: 15.6 seconds — ABNORMAL HIGH (ref 11.6–15.2)

## 2012-05-23 LAB — URINE CULTURE: Culture  Setup Time: 201305280822

## 2012-05-23 LAB — CBC
Hemoglobin: 11.1 g/dL — ABNORMAL LOW (ref 13.0–17.0)
MCHC: 32.7 g/dL (ref 30.0–36.0)
Platelets: 244 10*3/uL (ref 150–400)
RBC: 3.53 MIL/uL — ABNORMAL LOW (ref 4.22–5.81)

## 2012-05-23 SURGERY — LAPAROSCOPIC CHOLECYSTECTOMY WITH INTRAOPERATIVE CHOLANGIOGRAM
Anesthesia: General | Site: Abdomen | Wound class: Dirty or Infected

## 2012-05-23 MED ORDER — VECURONIUM BROMIDE 10 MG IV SOLR
INTRAVENOUS | Status: DC | PRN
  Administered 2012-05-23: 1 mg via INTRAVENOUS
  Administered 2012-05-23: 8 mg via INTRAVENOUS
  Administered 2012-05-23: 1 mg via INTRAVENOUS

## 2012-05-23 MED ORDER — LACTATED RINGERS IV SOLN
INTRAVENOUS | Status: DC | PRN
  Administered 2012-05-23: 12:00:00 via INTRAVENOUS

## 2012-05-23 MED ORDER — PIPERACILLIN-TAZOBACTAM 3.375 G IVPB 30 MIN
3.3750 g | INTRAVENOUS | Status: AC
  Administered 2012-05-23: 3.375 g via INTRAVENOUS
  Filled 2012-05-23: qty 50

## 2012-05-23 MED ORDER — PROPOFOL 10 MG/ML IV EMUL
INTRAVENOUS | Status: DC | PRN
  Administered 2012-05-23: 175 mg via INTRAVENOUS

## 2012-05-23 MED ORDER — SODIUM CHLORIDE 0.9 % IV SOLN
INTRAVENOUS | Status: DC | PRN
  Administered 2012-05-23: 13:00:00

## 2012-05-23 MED ORDER — HEMOSTATIC AGENTS (NO CHARGE) OPTIME
TOPICAL | Status: DC | PRN
  Administered 2012-05-23: 1 via TOPICAL

## 2012-05-23 MED ORDER — BUPIVACAINE-EPINEPHRINE 0.25% -1:200000 IJ SOLN
INTRAMUSCULAR | Status: DC | PRN
  Administered 2012-05-23: 8 mL

## 2012-05-23 MED ORDER — SODIUM CHLORIDE 0.9 % IR SOLN
Status: DC | PRN
  Administered 2012-05-23 (×3): 1000 mL

## 2012-05-23 MED ORDER — MUPIROCIN 2 % EX OINT
1.0000 "application " | TOPICAL_OINTMENT | Freq: Two times a day (BID) | CUTANEOUS | Status: DC
  Administered 2012-05-23 – 2012-05-25 (×5): 1 via NASAL
  Filled 2012-05-23: qty 22

## 2012-05-23 MED ORDER — LACTATED RINGERS IV SOLN
INTRAVENOUS | Status: DC
  Administered 2012-05-23 – 2012-05-24 (×2): via INTRAVENOUS

## 2012-05-23 MED ORDER — 0.9 % SODIUM CHLORIDE (POUR BTL) OPTIME
TOPICAL | Status: DC | PRN
  Administered 2012-05-23: 1000 mL

## 2012-05-23 MED ORDER — LIDOCAINE HCL (PF) 1 % IJ SOLN
INTRAMUSCULAR | Status: DC | PRN
  Administered 2012-05-23: 8 mL

## 2012-05-23 MED ORDER — FENTANYL CITRATE 0.05 MG/ML IJ SOLN
INTRAMUSCULAR | Status: DC | PRN
  Administered 2012-05-23 (×2): 50 ug via INTRAVENOUS
  Administered 2012-05-23: 150 ug via INTRAVENOUS

## 2012-05-23 MED ORDER — HYDROMORPHONE HCL PF 1 MG/ML IJ SOLN
INTRAMUSCULAR | Status: AC
  Filled 2012-05-23: qty 1

## 2012-05-23 MED ORDER — HYDROMORPHONE HCL PF 1 MG/ML IJ SOLN
INTRAMUSCULAR | Status: AC
  Administered 2012-05-23: 1 mg
  Filled 2012-05-23: qty 1

## 2012-05-23 MED ORDER — ONDANSETRON HCL 4 MG/2ML IJ SOLN
INTRAMUSCULAR | Status: DC | PRN
  Administered 2012-05-23: 4 mg via INTRAVENOUS

## 2012-05-23 MED ORDER — CHLORHEXIDINE GLUCONATE CLOTH 2 % EX PADS
6.0000 | MEDICATED_PAD | Freq: Every day | CUTANEOUS | Status: DC
  Administered 2012-05-23 – 2012-05-25 (×3): 6 via TOPICAL

## 2012-05-23 MED ORDER — HYDROMORPHONE HCL PF 1 MG/ML IJ SOLN
0.2500 mg | INTRAMUSCULAR | Status: DC | PRN
  Administered 2012-05-23 (×4): 0.5 mg via INTRAVENOUS

## 2012-05-23 SURGICAL SUPPLY — 56 items
ADH SKN CLS APL DERMABOND .7 (GAUZE/BANDAGES/DRESSINGS) ×1
APPLIER CLIP 5 13 M/L LIGAMAX5 (MISCELLANEOUS) ×4
APPLIER CLIP ROT 10 11.4 M/L (STAPLE)
APR CLP MED LRG 11.4X10 (STAPLE)
APR CLP MED LRG 5 ANG JAW (MISCELLANEOUS) ×2
BAG SPEC RTRVL LRG 6X4 10 (ENDOMECHANICALS) ×1
BLADE SURG ROTATE 9660 (MISCELLANEOUS) ×1 IMPLANT
CANISTER SUCTION 2500CC (MISCELLANEOUS) ×2 IMPLANT
CHLORAPREP W/TINT 26ML (MISCELLANEOUS) ×2 IMPLANT
CLIP APPLIE 5 13 M/L LIGAMAX5 (MISCELLANEOUS) IMPLANT
CLIP APPLIE ROT 10 11.4 M/L (STAPLE) ×1 IMPLANT
CLOTH BEACON ORANGE TIMEOUT ST (SAFETY) ×2 IMPLANT
COVER MAYO STAND STRL (DRAPES) IMPLANT
COVER SURGICAL LIGHT HANDLE (MISCELLANEOUS) ×2 IMPLANT
DECANTER SPIKE VIAL GLASS SM (MISCELLANEOUS) ×2 IMPLANT
DERMABOND ADVANCED (GAUZE/BANDAGES/DRESSINGS) ×1
DERMABOND ADVANCED .7 DNX12 (GAUZE/BANDAGES/DRESSINGS) ×1 IMPLANT
DRAIN CHANNEL 19F RND (DRAIN) ×1 IMPLANT
DRAPE C-ARM 42X72 X-RAY (DRAPES) ×1 IMPLANT
DRAPE UTILITY 15X26 W/TAPE STR (DRAPE) ×4 IMPLANT
DRAPE WARM FLUID 44X44 (DRAPE) ×2 IMPLANT
ELECT REM PT RETURN 9FT ADLT (ELECTROSURGICAL) ×2
ELECTRODE REM PT RTRN 9FT ADLT (ELECTROSURGICAL) ×1 IMPLANT
EVACUATOR SILICONE 100CC (DRAIN) ×1 IMPLANT
FILTER SMOKE EVAC LAPAROSHD (FILTER) IMPLANT
GLOVE BIO SURGEON STRL SZ 6 (GLOVE) ×3 IMPLANT
GLOVE BIO SURGEON STRL SZ7 (GLOVE) ×1 IMPLANT
GLOVE BIOGEL PI IND STRL 6.5 (GLOVE) ×1 IMPLANT
GLOVE BIOGEL PI IND STRL 7.0 (GLOVE) IMPLANT
GLOVE BIOGEL PI INDICATOR 6.5 (GLOVE) ×1
GLOVE BIOGEL PI INDICATOR 7.0 (GLOVE) ×3
GLOVE ECLIPSE 6.5 STRL STRAW (GLOVE) ×1 IMPLANT
GLOVE SS BIOGEL STRL SZ 7 (GLOVE) IMPLANT
GLOVE SUPERSENSE BIOGEL SZ 7 (GLOVE) ×1
GOWN PREVENTION PLUS XXLARGE (GOWN DISPOSABLE) ×1 IMPLANT
GOWN STRL NON-REIN LRG LVL3 (GOWN DISPOSABLE) ×3 IMPLANT
HEMOSTAT SNOW SURGICEL 2X4 (HEMOSTASIS) ×1 IMPLANT
KIT BASIN OR (CUSTOM PROCEDURE TRAY) ×3 IMPLANT
KIT ROOM TURNOVER OR (KITS) ×2 IMPLANT
NS IRRIG 1000ML POUR BTL (IV SOLUTION) ×2 IMPLANT
PAD ARMBOARD 7.5X6 YLW CONV (MISCELLANEOUS) ×2 IMPLANT
POUCH SPECIMEN RETRIEVAL 10MM (ENDOMECHANICALS) ×2 IMPLANT
SCISSORS LAP 5X35 DISP (ENDOMECHANICALS) IMPLANT
SET CHOLANGIOGRAPH 5 50 .035 (SET/KITS/TRAYS/PACK) ×1 IMPLANT
SET IRRIG TUBING LAPAROSCOPIC (IRRIGATION / IRRIGATOR) ×2 IMPLANT
SLEEVE ENDOPATH XCEL 5M (ENDOMECHANICALS) ×2 IMPLANT
SPECIMEN JAR SMALL (MISCELLANEOUS) ×2 IMPLANT
SUT ETHILON 2 0 FS 18 (SUTURE) ×1 IMPLANT
SUT MNCRL AB 4-0 PS2 18 (SUTURE) ×3 IMPLANT
SUT VICRYL 0 UR6 27IN ABS (SUTURE) ×1 IMPLANT
TOWEL OR 17X24 6PK STRL BLUE (TOWEL DISPOSABLE) ×1 IMPLANT
TOWEL OR 17X26 10 PK STRL BLUE (TOWEL DISPOSABLE) ×1 IMPLANT
TRAY LAPAROSCOPIC (CUSTOM PROCEDURE TRAY) ×2 IMPLANT
TROCAR XCEL BLUNT TIP 100MML (ENDOMECHANICALS) ×2 IMPLANT
TROCAR XCEL NON-BLD 11X100MML (ENDOMECHANICALS) ×2 IMPLANT
TROCAR XCEL NON-BLD 5MMX100MML (ENDOMECHANICALS) ×2 IMPLANT

## 2012-05-23 NOTE — Progress Notes (Signed)
Patient ID: [name], male   DOB: 1931-01-17, 78 y.o.   MRN: [id number]   Please see complete cardiology consult note from 05/22/2012. Nothing to add today.  Jerral Bonito, MD

## 2012-05-23 NOTE — Anesthesia Postprocedure Evaluation (Signed)
  Anesthesia Post-op Note  Patient: [name]  Procedure(s) Performed: Procedure(s) (LRB): LAPAROSCOPIC CHOLECYSTECTOMY WITH INTRAOPERATIVE CHOLANGIOGRAM (N/A)  Patient Location: PACU  Anesthesia Type: General  Level of Consciousness: awake  Airway and Oxygen Therapy: Patient Spontanous Breathing  Post-op Pain: mild  Post-op Assessment: Post-op Vital signs reviewed  Post-op Vital Signs: Reviewed  Complications: No apparent anesthesia complications

## 2012-05-23 NOTE — Progress Notes (Signed)
Patient ID: [name], male   DOB: October 01, 1931, 78 y.o.   MRN: [id number]    Subjective: Pain better still.  Cleared by cardiology in terms of no intervention required before surgery, but med to high risk for post op complications.    Objective: Vital signs in last 24 hours: Temp:  [97.3 F (36.3 C)-98.2 F (36.8 C)] 97.6 F (36.4 C) (05/29 0525) Pulse Rate:  [55-59] 59  (05/29 0525) Resp:  [18] 18  (05/29 0525) BP: (101-111)/(52-59) 110/52 mmHg (05/29 0525) SpO2:  [95 %-97 %] 97 % (05/29 0525) Weight:  [212 lb (96.163 kg)] 212 lb (96.163 kg) (05/29 0525) Last BM Date: 05/21/12  Intake/Output from previous day: 05/28 0701 - 05/29 0700 In: 870 [P.O.:720; IV Piggyback:150] Out: 1520 [Urine:1520] Intake/Output this shift:    General appearance: alert, cooperative and no distress GI: soft, non distended, less tender in RUQ  Lab Results:   Basename 05/23/12 0545 05/22/12 0505  WBC 21.0* 16.1*  HGB 11.1* 11.9*  HCT 33.9* 36.0*  PLT 244 243   BMET  Basename 05/23/12 0545 05/22/12 0505  NA 139 139  K 3.9 3.7  CL 98 98  CO2 30 32  GLUCOSE 146* 164*  BUN 25* 18  CREATININE 1.26 1.03  CALCIUM 9.5 9.2   PT/INR  Basename 05/23/12 0545 05/22/12 0505  LABPROT 15.6* 16.7*  INR 1.21 1.33   ABG No results found for this basename: PHART:2,PCO2:2,PO2:2,HCO3:2 in the last 72 hours  Studies/Results: Dg Neck Soft Tissue  05/21/2012  *RADIOLOGY REPORT*  Clinical Data: Preop for surgery.  The patient has rheumatoid arthritis.  NECK SOFT TISSUES - 1+ VIEW  Comparison: None.  Findings: No soft tissue abnormality, or displacement of the airway is seen.  There is mild disc space narrowing at C4-5, C5-6 and C6- 7.  There is no widening of the predental space at C1-C2. There is no prevertebral soft tissue swelling.  There is moderate to marked bilateral carotid calcification.  Skeletal osteopenia is suggested.  IMPRESSION: Chronic changes as described.  Original Report Authenticated By:  Elsie Stain, M.D.   Dg Chest 2 View  05/21/2012  *RADIOLOGY REPORT*  Clinical Data: COPD.  Preop gallbladder surgery.  CHEST - 2 VIEW  Comparison: 09/02/2010  Findings: Shallow inspiration.  Borderline heart size and pulmonary vascularity are likely normal for inspiratory effort. Emphysematous changes and scattered fibrosis in the lungs.  Linear atelectasis or infiltration in the right lung base.  Degenerative changes in the thoracic spine.  Calcified and tortuous aorta.  IMPRESSION: Emphysematous changes and scattered fibrosis in the lungs.  Shallow inspiration with linear atelectasis or infiltration in the right lung base.  Original Report Authenticated By: Marlon Pel, M.D.   Ct Abdomen Pelvis W Contrast  05/21/2012  *RADIOLOGY REPORT*  Clinical Data: Abdominal pain.  Hypertension.  History of cholelithiasis.  Gastroesophageal reflux disease.  CT ABDOMEN AND PELVIS WITH CONTRAST  Technique:  Multidetector CT imaging of the abdomen and pelvis was performed following the standard protocol during bolus administration of intravenous contrast.  Contrast: OMNIPAQUE IOHEXOL 300 MG/ML  SOLN  Comparison: Multiple exams, including 04/12/2012 and 01/24/2012  Findings: Emphysema noted at the lung bases.  Faint peripheral interstitial accentuation suggest mild fibrosis.  Coronary artery atherosclerotic calcification noted with mitral annular calcification.  Oral contrast medium in the distal esophagus is compatible with gastroesophageal reflux.  There is a small hiatal hernia.  Mild atelectasis or scarring noted in the right middle lobe. Linear opacity within  the 8 mm nodular component is observed in the lingula on image 12 of series 3; the patient has had similar nodules described on prior exams including 03/29/2011 although this nodule or area of rounded atelectasis appears to be lower in position.  There was previously a greater amount of atelectasis in this location.  Scattered cysts are present in  the lateral segment left hepatic lobe.  Dependent density in the gallbladder appears stable and has been previously worked up pound represent tumefactive sludge and possible small gallstones.  Old granulomatous disease of the spleen noted.  The pancreas and adrenal glands appear unremarkable by CT. Bilateral renal cysts noted.  Aortoiliac atherosclerotic calcification is present.  The appendix appears normal.  No pathologic retroperitoneal or porta hepatis adenopathy is identified.  No pathologic pelvic adenopathy is identified.  Urinary bladder appears normal.  There is degenerative arthropathy of both hips, with findings of avascular necrosis or more likely degenerative arthropathy in the right femoral head.  Chronic lumbar spine endplate compressions noted.  Posterolateral rod pedicle screw fixation noted at the L4-5 level, with grade 1 anterolisthesis of L4 on L5 and multilevel lumbar spondylosis. Mild band of sclerosis along the concave superior endplate of T11 suggests subacute compression fracture at T11.  IMPRESSION:  1.  Gastroesophageal reflux. 2.  Suspected subacute compression fracture along the superior endplate of T11. 3.  Hepatic and renal cysts. 4.  Tumefactive sludge in the gallbladder. 5.  Atherosclerosis. 6.  Degenerative arthropathy of the hips, with subcortical degenerative cysts or mild avascular necrosis in the right femoral head. 7.  8 mm nodular component of a band-like density in the lingula, possibly representing rounded atelectasis or a true pulmonary nodule.  The patient has a history of pulmonary nodules which were seen on CT scan of 03/29/2011; a follow-up CT the chest was recommended on 03/29/2011 but is not apparent in the [facility] system - follow-up CT of the chest is likely warranted if not performed at an outside institution. 8.  Emphysema with suspected mild fibrosis in the lung bases. 9.  Small hiatal hernia.  Original Report Authenticated By: Dellia Cloud, M.D.     Anti-infectives: Anti-infectives     Start     Dose/Rate Route Frequency Ordered Stop   05/21/12 2200   piperacillin-tazobactam (ZOSYN) IVPB 3.375 g        3.375 g 12.5 mL/hr over 4 Hours Intravenous 3 times per day 05/21/12 2130            Assessment/Plan: s/p * No surgery found * Chronic cholecystitis exacerbated by fatty food intake this weekend. Plan lap chole today Discussed surgery with pt and spouse. Reviewed risk of bleeding, infection, damage to adjacent structures, bile leak, risk of opening. Discussed risk of cardiac complications.   . LOS: 2 days    [facility] 05/23/2012

## 2012-05-23 NOTE — Anesthesia Preprocedure Evaluation (Addendum)
Anesthesia Evaluation  Patient identified by MRN, date of birth, ID band Patient awake, Patient confused and Patient unresponsive    Reviewed: Allergy & Precautions, H&P , NPO status , Patient's Chart, lab work & pertinent test results  Airway Mallampati: II      Dental  (+) Dental Advidsory Given, Edentulous Upper and Edentulous Lower   Pulmonary asthma , COPD COPD inhaler,  breath sounds clear to auscultation        Cardiovascular hypertension, On Medications - dysrhythmias Atrial Fibrillation Rhythm:Regular Rate:Normal     Neuro/Psych    GI/Hepatic Neg liver ROS, GERD-  Medicated and Controlled,  Endo/Other  negative endocrine ROS  Renal/GU negative Renal ROS     Musculoskeletal  (+) Arthritis -,   Abdominal   Peds  Hematology negative hematology ROS (+)   Anesthesia Other Findings   Reproductive/Obstetrics                          Anesthesia Physical Anesthesia Plan  ASA: III  Anesthesia Plan: General   Post-op Pain Management:    Induction: Intravenous  Airway Management Planned: Oral ETT  Additional Equipment:   Intra-op Plan:   Post-operative Plan: Extubation in OR  Informed Consent: I have reviewed the patients History and Physical, chart, labs and discussed the procedure including the risks, benefits and alternatives for the proposed anesthesia with the patient or authorized representative who has indicated his/her understanding and acceptance.   History available from chart only and Dental Advisory Given  Plan Discussed with: Anesthesiologist and CRNA  Anesthesia Plan Comments:        Anesthesia Quick Evaluation

## 2012-05-23 NOTE — OR Nursing (Signed)
Late entry at 1421 to add x-ray technologist.

## 2012-05-23 NOTE — Op Note (Addendum)
Laparoscopic Cholecystectomy with IOC Procedure Note  Indications: This patient presents with acalculous cholecystitis and will undergo laparoscopic cholecystectomy.  Pre-operative Diagnosis: Acalculous cholecystitis  Post-operative Diagnosis: Same  Surgeon: Almond Lint   Assistants: Amie Critchley, PA-S  Anesthesia: General endotracheal anesthesia and local  ASA Class: 3  Procedure Details  The patient was seen again in the Holding Room. The risks, benefits, complications, treatment options, and expected outcomes were discussed with the patient. The possibilities of  bleeding, recurrent infection, damage to nearby structures, the need for additional procedures, failure to diagnose a condition, the possible need to convert to an open procedure, and creating a complication requiring transfusion or operation were discussed with the patient. The likelihood of improving the patient's symptoms with return to their baseline status is good.    The patient and/or family concurred with the proposed plan, giving informed consent. The site of surgery properly noted. The patient was taken to Operating Room, and the procedure verified as Laparoscopic Cholecystectomy with Intraoperative Cholangiogram. A Time Out was held and the above information confirmed.  Prior to the induction of general anesthesia, antibiotic prophylaxis was administered. General endotracheal anesthesia was then administered and tolerated well. After the induction, the abdomen was prepped with Chloraprep and draped in the sterile fashion. The patient was positioned in the supine position.  Local anesthetic agent was injected into the skin near the umbilicus and an incision made. We dissected down to the abdominal fascia with blunt dissection.  The fascia was incised vertically and we entered the peritoneal cavity bluntly.  A pursestring suture of 0-Vicryl was placed around the fascial opening.  The Hasson cannula was inserted and  secured with the stay suture.  Pneumoperitoneum was then created with CO2 and tolerated well without any adverse changes in the patient's vital signs. An 11-mm port was placed in the subxiphoid position.  Two 5-mm ports were placed in the right upper quadrant. All skin incisions were infiltrated with a local anesthetic agent before making the incision and placing the trocars.   We positioned the patient in reverse Trendelenburg, tilted slightly to the patient's left.  The gallbladder was identified, and was quite tense and inflamed.  The Nezhat suction was used to aspirate the gallbladder.  The contents were murky and bilious.  The fundus was grasped and retracted cephalad. Adhesions were lysed bluntly and with the electrocautery where indicated, taking care not to injure any adjacent organs or viscus. The infundibulum was grasped and retracted laterally, exposing the peritoneum overlying the triangle of Calot. This was then divided and exposed in a blunt fashion. A critical view of the cystic duct and cystic artery was obtained.  The cystic duct was clearly identified and bluntly dissected circumferentially. The cystic duct was ligated with a clip distally.   An incision was made in the cystic duct and the [facility] cholangiogram catheter introduced. The catheter was secured using a clip. A cholangiogram was then performed.  The pt was placed flat, and a cholangiogram was then obtained which showed good visualization of the distal and proximal biliary tree and the duodenum. The catheter was then removed.   The cystic duct was then ligated with clips and divided. The cystic artery was identified, dissected free, ligated with clips and divided as well.   The gallbladder was dissected from the liver bed in retrograde fashion with the electrocautery. The gallbladder wall was quite adherent to the liver.  There was spillage of the murky bile.  The gallbladder was removed and placed in  an Endocatch bag.  The gallbladder  and Endocatch bag were then removed through the umbilical port site.  The liver bed was irrigated and inspected. Hemostasis was achieved with the electrocautery. Copious irrigation was utilized and was repeatedly aspirated until clear.  SNOW hemostatic agent was placed in the gallbladder bed.    We again inspected the right upper quadrant for hemostasis.  A 19 Fr Blake drain was placed in the RUQ and secured to the abdominal wall with a 2-0 nylon suture.  Pneumoperitoneum was released as we removed the trocars.   The pursestring suture was used to close the umbilical fascia.  4-0 Monocryl was used to close the skin.   The skin was cleaned and dry, and Dermabond was applied. The patient was then extubated and brought to the recovery room in stable condition. Instrument, sponge, and needle counts were correct at closure and at the conclusion of the case.   Findings: Acute Cholecystitis with sludge in gallbladder.    Estimated Blood Loss: Minimal         Drains: 19 Fr Blake drain          Specimens: Gallbladder           Complications: None; patient tolerated the procedure well.         Disposition: PACU - hemodynamically stable.         Condition: stable

## 2012-05-23 NOTE — Preoperative (Signed)
Beta Blockers   Reason not to administer Beta Blockers:Not Applicable 

## 2012-05-23 NOTE — Progress Notes (Signed)
DAILY PROGRESS NOTE                              GENERAL [facility]  SUBJECTIVE: Very mild RUQ pain, otherwise no complaints.  OBJECTIVE: BP 110/52  Pulse 59  Temp(Src) 97.6 F (36.4 C) (Oral)  Resp 18  Ht 6\' 1"  (1.854 m)  Wt 96.163 kg (212 lb)  BMI 27.97 kg/m2  SpO2 97%  Intake/Output Summary (Last 24 hours) at 05/23/12 1120 Last data filed at 05/23/12 9518  Gross per 24 hour  Intake    510 ml  Output   1220 ml  Net   -710 ml                      Weight change: -0.137 kg (-4.9 oz) Physical Exam: General: Alert and awake oriented x3 not in any acute distress. HEENT: anicteric sclera, pupils equal reactive to light and accommodation CVS: S1-S2 heard, no murmur rubs or gallops Chest: clear to auscultation bilaterally, no wheezing rales or rhonchi Abdomen:  normal bowel sounds, soft, mild to moderate RUQ tenderness. Neuro: Cranial nerves II-XII intact, no focal neurological deficits Extremities: no cyanosis, no clubbing or edema noted bilaterally   Lab Results:  Basename 05/23/12 0545 05/22/12 0505  NA 139 139  K 3.9 3.7  CL 98 98  CO2 30 32  GLUCOSE 146* 164*  BUN 25* 18  CREATININE 1.26 1.03  CALCIUM 9.5 9.2  MG -- 2.1  PHOS -- 3.3    Basename 05/22/12 0505 05/21/12 1308  AST 14 17  ALT 14 18  ALKPHOS 60 58  BILITOT 1.6* 2.1*  PROT 6.8 7.8  ALBUMIN 3.0* 3.5    Basename 05/21/12 1939 05/21/12 1308  LIPASE 22 28  AMYLASE -- --    Basename 05/23/12 0545 05/22/12 0505 05/21/12 1308  WBC 21.0* 16.1* --  NEUTROABS -- 15.3* 16.4*  HGB 11.1* 11.9* --  HCT 33.9* 36.0* --  MCV 96.0 97.0 --  PLT 244 243 --   No results found for this basename: CKTOTAL:3,CKMB:3,CKMBINDEX:3,TROPONINI:3 in the last 72 hours No components found with this basename: POCBNP:3 No results found for this basename: DDIMER:2 in the last 72 hours No results found for this basename: HGBA1C:2 in the last 72 hours  Basename 05/22/12 0505  CHOL 160  HDL 49    LDLCALC 91  TRIG 98  CHOLHDL 3.3  LDLDIRECT --    Basename 05/22/12 0505  TSH 0.598  T4TOTAL --  T3FREE --  THYROIDAB --   No results found for this basename: VITAMINB12:2,FOLATE:2,FERRITIN:2,TIBC:2,IRON:2,RETICCTPCT:2 in the last 72 hours  Micro Results: Recent Results (from the past 240 hour(s))  CULTURE, BLOOD (ROUTINE X 2)     Status: Normal (Preliminary result)   Collection Time   05/21/12  7:47 PM      Component Value Range Status Comment   Specimen Description BLOOD ARM RIGHT   Final    Special Requests BOTTLES DRAWN AEROBIC AND ANAEROBIC 10CC   Final    Culture  Setup Time [id number]   Final    Culture     Final    Value:        BLOOD CULTURE RECEIVED NO GROWTH TO DATE CULTURE WILL BE HELD FOR 5 DAYS BEFORE ISSUING A FINAL NEGATIVE REPORT   Report Status PENDING   Incomplete   CULTURE, BLOOD (ROUTINE X 2)     Status: Normal (Preliminary result)  Collection Time   05/21/12  7:56 PM      Component Value Range Status Comment   Specimen Description BLOOD HAND LEFT   Final    Special Requests BOTTLES DRAWN AEROBIC ONLY 5CC   Final    Culture  Setup Time [id number]   Final    Culture     Final    Value:        BLOOD CULTURE RECEIVED NO GROWTH TO DATE CULTURE WILL BE HELD FOR 5 DAYS BEFORE ISSUING A FINAL NEGATIVE REPORT   Report Status PENDING   Incomplete   URINE CULTURE     Status: Normal   Collection Time   05/22/12  7:59 AM      Component Value Range Status Comment   Specimen Description URINE, RANDOM   Final    Special Requests NONE   Final    Culture  Setup Time [id number]   Final    Colony Count NO GROWTH   Final    Culture NO GROWTH   Final    Report Status 05/23/2012 FINAL   Final   SURGICAL PCR SCREEN     Status: Abnormal   Collection Time   05/23/12  5:16 AM      Component Value Range Status Comment   MRSA, PCR POSITIVE (*) NEGATIVE  Final    Staphylococcus aureus POSITIVE (*) NEGATIVE  Final     Studies/Results: Dg Neck Soft  Tissue  05/21/2012  *RADIOLOGY REPORT*  Clinical Data: Preop for surgery.  The patient has rheumatoid arthritis.  NECK SOFT TISSUES - 1+ VIEW  Comparison: None.  Findings: No soft tissue abnormality, or displacement of the airway is seen.  There is mild disc space narrowing at C4-5, C5-6 and C6- 7.  There is no widening of the predental space at C1-C2. There is no prevertebral soft tissue swelling.  There is moderate to marked bilateral carotid calcification.  Skeletal osteopenia is suggested.  IMPRESSION: Chronic changes as described.  Original Report Authenticated By: Elsie Stain, M.D.   Dg Chest 2 View  05/21/2012  *RADIOLOGY REPORT*  Clinical Data: COPD.  Preop gallbladder surgery.  CHEST - 2 VIEW  Comparison: 09/02/2010  Findings: Shallow inspiration.  Borderline heart size and pulmonary vascularity are likely normal for inspiratory effort. Emphysematous changes and scattered fibrosis in the lungs.  Linear atelectasis or infiltration in the right lung base.  Degenerative changes in the thoracic spine.  Calcified and tortuous aorta.  IMPRESSION: Emphysematous changes and scattered fibrosis in the lungs.  Shallow inspiration with linear atelectasis or infiltration in the right lung base.  Original Report Authenticated By: Marlon Pel, M.D.   Ct Abdomen Pelvis W Contrast  05/21/2012  *RADIOLOGY REPORT*  Clinical Data: Abdominal pain.  Hypertension.  History of cholelithiasis.  Gastroesophageal reflux disease.  CT ABDOMEN AND PELVIS WITH CONTRAST  Technique:  Multidetector CT imaging of the abdomen and pelvis was performed following the standard protocol during bolus administration of intravenous contrast.  Contrast: OMNIPAQUE IOHEXOL 300 MG/ML  SOLN  Comparison: Multiple exams, including 04/12/2012 and 01/24/2012  Findings: Emphysema noted at the lung bases.  Faint peripheral interstitial accentuation suggest mild fibrosis.  Coronary artery atherosclerotic calcification noted with mitral  annular calcification.  Oral contrast medium in the distal esophagus is compatible with gastroesophageal reflux.  There is a small hiatal hernia.  Mild atelectasis or scarring noted in the right middle lobe. Linear opacity within the 8 mm nodular component is observed in the lingula on  image 12 of series 3; the patient has had similar nodules described on prior exams including 03/29/2011 although this nodule or area of rounded atelectasis appears to be lower in position.  There was previously a greater amount of atelectasis in this location.  Scattered cysts are present in the lateral segment left hepatic lobe.  Dependent density in the gallbladder appears stable and has been previously worked up pound represent tumefactive sludge and possible small gallstones.  Old granulomatous disease of the spleen noted.  The pancreas and adrenal glands appear unremarkable by CT. Bilateral renal cysts noted.  Aortoiliac atherosclerotic calcification is present.  The appendix appears normal.  No pathologic retroperitoneal or porta hepatis adenopathy is identified.  No pathologic pelvic adenopathy is identified.  Urinary bladder appears normal.  There is degenerative arthropathy of both hips, with findings of avascular necrosis or more likely degenerative arthropathy in the right femoral head.  Chronic lumbar spine endplate compressions noted.  Posterolateral rod pedicle screw fixation noted at the L4-5 level, with grade 1 anterolisthesis of L4 on L5 and multilevel lumbar spondylosis. Mild band of sclerosis along the concave superior endplate of T11 suggests subacute compression fracture at T11.  IMPRESSION:  1.  Gastroesophageal reflux. 2.  Suspected subacute compression fracture along the superior endplate of T11. 3.  Hepatic and renal cysts. 4.  Tumefactive sludge in the gallbladder. 5.  Atherosclerosis. 6.  Degenerative arthropathy of the hips, with subcortical degenerative cysts or mild avascular necrosis in the right femoral  head. 7.  8 mm nodular component of a band-like density in the lingula, possibly representing rounded atelectasis or a true pulmonary nodule.  The patient has a history of pulmonary nodules which were seen on CT scan of 03/29/2011; a follow-up CT the chest was recommended on 03/29/2011 but is not apparent in the [facility] system - follow-up CT of the chest is likely warranted if not performed at an outside institution. 8.  Emphysema with suspected mild fibrosis in the lung bases. 9.  Small hiatal hernia.  Original Report Authenticated By: Dellia Cloud, M.D.   Medications: Scheduled Meds:   . Chlorhexidine Gluconate Cloth  6 each Topical Q0600  . cloNIDine  0.1 mg Oral BID  . diltiazem  180 mg Oral Daily  . folic acid  1 mg Oral Daily  . furosemide  40 mg Oral BID  . gabapentin  300 mg Oral TID  . loratadine  10 mg Oral Daily  . methotrexate  17.5 mg Oral Weekly  . methylPREDNISolone (SOLU-MEDROL) injection  40 mg Intravenous Daily  . mupirocin ointment  1 application Nasal BID  . pantoprazole  40 mg Oral Q1200  . piperacillin-tazobactam  3.375 g Intravenous Q8H  . potassium chloride SA  20 mEq Oral BID  . potassium chloride  20 mEq Oral Once  . senna  1 tablet Oral BID  . sodium chloride  3 mL Intravenous Q12H  . DISCONTD: Etanercept  25 mg Subcutaneous 2 times weekly   Continuous Infusions:  PRN Meds:.acetaminophen, acetaminophen, albuterol, HYDROcodone-acetaminophen, morphine injection, ondansetron (ZOFRAN) IV, ondansetron, zolpidem  ASSESSMENT & PLAN: Active Problems:  Atrial fibrillation  Lupus anticoagulant positive  Acute acalculous cholecystitis  COPD (chronic obstructive pulmonary disease)  Long term current use of anticoagulant  Obesity (BMI 35.0-39.9 without comorbidity)  HTN (hypertension)  Arthritis   Acute and chronic acalculous cholecystitis -Patient admitted to telemetry, the time of admission WBCs were 16,000. -Patient started on Zosyn, WBC is still  elevated. -Expect the WBCs to  go to down, if not I might consult infectious disease in the morning. -Patient for laparoscopic cholecystectomy today.  Atrial fibrillation -Rate controlled, patient is on chronic anticoagulation with Coumadin. -Patient is off of his Coumadin for the surgery today. Likely to restart Coumadin tonight if okay with general surgery. -INR is 1.2 today.  Hypercoagulable status -Antiphospholipid syndrome with positive lupus anticoagulant. -Patient is on Coumadin, that's on hold because of upcoming surgery.  Rheumatoid arthritis -Stable chronic condition no evidence of flareup. -Patient is on Enbrel and methotrexate at home, continued.  COPD -Patient was placed on IV Solu-Medrol because of likely to decompensate. -Continue bronchodilators, oxygen and incentive spirometry.    LOS: 2 days   Laronica Bhagat A 05/23/2012, 11:20 AM

## 2012-05-23 NOTE — Transfer of Care (Signed)
Immediate Anesthesia Transfer of Care Note  Patient: [name]  Procedure(s) Performed: Procedure(s) (LRB): LAPAROSCOPIC CHOLECYSTECTOMY WITH INTRAOPERATIVE CHOLANGIOGRAM (N/A)  Patient Location: PACU  Anesthesia Type: General  Level of Consciousness: awake, alert  and oriented  Airway & Oxygen Therapy: Patient Spontanous Breathing and Patient connected to face mask oxygen  Post-op Assessment: Report given to PACU RN, Post -op Vital signs reviewed and stable and Patient moving all extremities  Post vital signs: Reviewed and stable  Complications: No apparent anesthesia complications

## 2012-05-24 ENCOUNTER — Encounter (HOSPITAL_COMMUNITY): Payer: Self-pay | Admitting: General Surgery

## 2012-05-24 DIAGNOSIS — J438 Other emphysema: Secondary | ICD-10-CM

## 2012-05-24 DIAGNOSIS — K810 Acute cholecystitis: Secondary | ICD-10-CM

## 2012-05-24 DIAGNOSIS — Z7901 Long term (current) use of anticoagulants: Secondary | ICD-10-CM

## 2012-05-24 DIAGNOSIS — I4891 Unspecified atrial fibrillation: Secondary | ICD-10-CM

## 2012-05-24 LAB — CBC
MCH: 32.3 pg (ref 26.0–34.0)
MCHC: 32.9 g/dL (ref 30.0–36.0)
MCV: 98.1 fL (ref 78.0–100.0)
Platelets: 206 10*3/uL (ref 150–400)

## 2012-05-24 LAB — COMPREHENSIVE METABOLIC PANEL
ALT: 39 U/L (ref 0–53)
AST: 44 U/L — ABNORMAL HIGH (ref 0–37)
Calcium: 9 mg/dL (ref 8.4–10.5)
Creatinine, Ser: 1.1 mg/dL (ref 0.50–1.35)
GFR calc Af Amer: 72 mL/min — ABNORMAL LOW (ref >90)
Sodium: 138 mEq/L (ref 135–145)
Total Protein: 6.3 g/dL (ref 6.0–8.3)

## 2012-05-24 MED ORDER — WARFARIN - PHARMACIST DOSING INPATIENT: Freq: Every day | Status: DC

## 2012-05-24 MED ORDER — PREDNISONE 20 MG PO TABS
40.0000 mg | ORAL_TABLET | Freq: Every day | ORAL | Status: DC
  Administered 2012-05-25: 40 mg via ORAL
  Filled 2012-05-24 (×2): qty 2

## 2012-05-24 MED ORDER — WARFARIN SODIUM 3 MG PO TABS
3.0000 mg | ORAL_TABLET | Freq: Once | ORAL | Status: DC
  Filled 2012-05-24: qty 1

## 2012-05-24 MED ORDER — MORPHINE SULFATE 2 MG/ML IJ SOLN
2.0000 mg | INTRAMUSCULAR | Status: DC | PRN
  Administered 2012-05-24 (×2): 2 mg via INTRAVENOUS
  Filled 2012-05-24 (×2): qty 1

## 2012-05-24 MED ORDER — DILTIAZEM HCL ER COATED BEADS 180 MG PO CP24
180.0000 mg | ORAL_CAPSULE | Freq: Every day | ORAL | Status: DC
  Administered 2012-05-25: 180 mg via ORAL
  Filled 2012-05-24: qty 1

## 2012-05-24 NOTE — Progress Notes (Signed)
Patient ID: [name], male   DOB: [birth year], 78 y.o.   MRN: [id number] 1 Day Post-Op  Subjective: Pt feels fairly well.  Tolerating clears. Wants to get up and mobilize.  Objective: Vital signs in last 24 hours: Temp:  [97 F (36.1 C)-98.3 F (36.8 C)] 98.1 F (36.7 C) (05/30 0710) Pulse Rate:  [50-85] 50  (05/30 0710) Resp:  [16-20] 20  (05/30 0710) BP: (113-179)/(42-75) 113/60 mmHg (05/30 0710) SpO2:  [92 %-99 %] 97 % (05/30 0710) Weight:  [211 lb 6.4 oz (95.89 kg)] 211 lb 6.4 oz (95.89 kg) (05/30 0710) Last BM Date: 05/23/12  Intake/Output from previous day: 05/29 0701 - 05/30 0700 In: 3324.2 [P.O.:1160; I.V.:2019.2; IV Piggyback:100] Out: 1192 [Urine:1075; Drains:22; Blood:75] Intake/Output this shift: Total I/O In: 240 [P.O.:240] Out: 475 [Urine:475]  PE: Abd: soft, appropriately tender, ND, incisions c/d/i with dermabond.  JP with serosang output.  Lab Results:   Basename 05/24/12 0627 05/23/12 0545  WBC 15.5* 21.0*  HGB 10.2* 11.1*  HCT 31.0* 33.9*  PLT 206 244   BMET  Basename 05/24/12 0627 05/23/12 0545  NA 138 139  K 4.3 3.9  CL 96 98  CO2 32 30  GLUCOSE 113* 146*  BUN 24* 25*  CREATININE 1.10 1.26  CALCIUM 9.0 9.5   PT/INR  Basename 05/23/12 0545 05/22/12 0505  LABPROT 15.6* 16.7*  INR 1.21 1.33   CMP     Component Value Date/Time   NA 138 05/24/2012 0627   NA 140 01/11/2012 1005   K 4.3 05/24/2012 0627   K 4.5 01/11/2012 1005   CL 96 05/24/2012 0627   CO2 32 05/24/2012 0627   GLUCOSE 113* 05/24/2012 0627   BUN 24* 05/24/2012 0627   BUN 12 01/11/2012 1005   CREATININE 1.10 05/24/2012 0627   CREATININE 1.17 01/11/2012 1005   CALCIUM 9.0 05/24/2012 0627   CALCIUM 9.2 01/11/2012 1005   PROT 6.3 05/24/2012 0627   ALBUMIN 2.7* 05/24/2012 0627   AST 44* 05/24/2012 0627   ALT 39 05/24/2012 0627   ALKPHOS 40 05/24/2012 0627   BILITOT 0.8 05/24/2012 0627   GFRNONAA 62* 05/24/2012 0627   GFRAA 72* 05/24/2012 0627   Lipase     Component Value  Date/Time   LIPASE 22 05/21/2012 1939       Studies/Results: Dg Cholangiogram Operative  05/23/2012  *RADIOLOGY REPORT*  Clinical Data:   Cholelithiasis  INTRAOPERATIVE CHOLANGIOGRAM  Technique:  Cholangiographic image from the C-arm fluoroscopic device   submitted for interpretation post-operatively.  Please see the procedural report for the amount of contrast and the fluoroscopy time utilized.  Comparison:  None  Findings:  No   filling defects in the common duct. Intrahepatic ducts are incompletely visualized, appearing decompressed centrally. Contrast passes into the duodenum.  IMPRESSION  Negative for retained common duct stone.  Original Report Authenticated By: Osa Craver, M.D.    Anti-infectives: Anti-infectives     Start     Dose/Rate Route Frequency Ordered Stop   05/23/12 1215  piperacillin-tazobactam (ZOSYN) IVPB 3.375 g       3.375 g 100 mL/hr over 30 Minutes Intravenous To Surgery 05/23/12 1205 05/23/12 1221   05/21/12 2200  piperacillin-tazobactam (ZOSYN) IVPB 3.375 g       3.375 g 12.5 mL/hr over 4 Hours Intravenous 3 times per day 05/21/12 2130             Assessment/Plan  1. S/p lap chole 2. A fib 3. Multiple medical problems  Plan: 1. Do NOT start coumadin today.  Will recheck CBC in am.  If hgb stable and drain looks good would consider coumadin to restart tomorrow. 2. Advance to regular heart healthy diet for lunch and change to oral pain meds 3. Mobilize and pulm toilet today.   LOS: 3 days    Kamia Insalaco E 05/24/2012

## 2012-05-24 NOTE — Progress Notes (Signed)
Pt heart rate 49 when dose of PO Cardizem due.  MD has been made aware.  Parameters added to med.  Will continue to monitor. Nino Glow RN

## 2012-05-24 NOTE — Progress Notes (Signed)
Doing well. Drain non bilious. High risk for abscess formation.

## 2012-05-24 NOTE — Progress Notes (Addendum)
ANTICOAGULATION CONSULT NOTE - Initial Consult  Pharmacy Consult for Coumadin Indication: atrial fibrillation and pulmonary embolus (history), lupus anticoagulant +  Allergies  Allergen Reactions  . Cymbalta (Duloxetine Hcl) Other (See Comments)    Confusion   . Procaine Hcl Hives and Other (See Comments)    Sweating, Confusion, Not in right state of mind.    Patient Measurements: Height: 6\' 1"  (185.4 cm) Weight: 211 lb 6.4 oz (95.89 kg) (b scale) IBW/kg (Calculated) : 79.9   Vital Signs: Temp: 98.1 F (36.7 C) (05/30 0710) Temp src: Oral (05/30 0710) BP: 113/60 mmHg (05/30 0710) Pulse Rate: 50  (05/30 0710)  Labs:  Basename 05/24/12 0627 05/23/12 0545 05/22/12 0505 05/21/12 1308  HGB 10.2* 11.1* -- --  HCT 31.0* 33.9* 36.0* --  PLT 206 244 243 --  APTT -- -- 38* --  LABPROT -- 15.6* 16.7* 17.6*  INR -- 1.21 1.33 1.42  HEPARINUNFRC -- -- -- --  CREATININE 1.10 1.26 1.03 --  CKTOTAL -- -- -- --  CKMB -- -- -- --  TROPONINI -- -- -- --    Estimated Creatinine Clearance: 67.6 ml/min (by C-G formula based on Cr of 1.1).   Medical History: Past Medical History  Diagnosis Date  . COPD (chronic obstructive pulmonary disease)     Uses occasional nighttime O2  . Disseminated herpes zoster 2010  . GERD (gastroesophageal reflux disease)   . Asthma   . Pulmonary embolus     2003 and 2011  . Pulmonary nodules      Chest CT 09/11  . Mixed hyperlipidemia   . Atrial fibrillation     CHADS2 score 2  . Lupus anticoagulant positive   . Hypertension   . Gall stones   . Rheumatoid arthritis   . History of chicken pox 1941; 2011  . Anemia   . Chronic lower back pain   . Edema     Evaluated by Dr. Diona Browner - felt more likely component of cor pulmonale due to chronic lung disease (EF  60-65%, grade 2 diastolic dysfunction by echo 01/2012)    Medications:  Scheduled:    . Chlorhexidine Gluconate Cloth  6 each Topical Q0600  . cloNIDine  0.1 mg Oral BID  . diltiazem   180 mg Oral Daily  . folic acid  1 mg Oral Daily  . furosemide  40 mg Oral BID  . gabapentin  300 mg Oral TID  . HYDROmorphone      . HYDROmorphone      . loratadine  10 mg Oral Daily  . methotrexate  17.5 mg Oral Weekly  . mupirocin ointment  1 application Nasal BID  . pantoprazole  40 mg Oral Q1200  . piperacillin-tazobactam  3.375 g Intravenous Q8H  . piperacillin-tazobactam  3.375 g Intravenous To OR  . potassium chloride SA  20 mEq Oral BID  . predniSONE  40 mg Oral Q breakfast  . senna  1 tablet Oral BID  . sodium chloride  3 mL Intravenous Q12H  . DISCONTD: methylPREDNISolone (SOLU-MEDROL) injection  40 mg Intravenous Daily    Assessment: 78 yo M on Coumadin PTA for hx PE, Afib, lupus anticoag +.  Presented with abd pain/cholecystitis and Coumadin was help for lap-chole (completed 5/29).  INR subtherapeutic as expected.  Will resume anti-coag post-op with initial dose of 1.5 x home dose.  Home Coumadin dose = 2 mg PO daily  Goal of Therapy:  INR 2-3   Plan:  Coumadin 3 mg PO  x 1 tonight. Daily INR.  [employer], Pharm.D., BCPS Clinical Pharmacist Pager [phone] 05/24/2012 10:14 AM    Addendum:  Noted plans from surgical standpoint NOT to start Coumadin today and follow up with CBC in AM.  Have d/c'd order for Coumadin.  Will continue to follow peripherally.      [employer], Pharm.D., BCPS Clinical Pharmacist Pager [phone] 05/24/2012 11:32 AM

## 2012-05-24 NOTE — Progress Notes (Addendum)
Patient ID: [name], male   DOB: Sep 19, 1931, 78 y.o.   MRN: [id number]   SUBJECTIVE:The patient underwent surgery yesterday and is stable. He did not have any significant arrhythmias.   Filed Vitals:   05/23/12 1839 05/23/12 2227 05/24/12 0233 05/24/12 0710  BP: 132/65 134/75 130/72 113/60  Pulse: 78 76 62 50  Temp:  97.2 F (36.2 C) 98.3 F (36.8 C) 98.1 F (36.7 C)  TempSrc:  Oral Oral Oral  Resp: 18 16 18 20   Height:      Weight:    211 lb 6.4 oz (95.89 kg)  SpO2: 99% 97% 96% 97%    Intake/Output Summary (Last 24 hours) at 05/24/12 0746 Last data filed at 05/24/12 7829  Gross per 24 hour  Intake 3324.16 ml  Output   1192 ml  Net 2132.16 ml    LABS: Basic Metabolic Panel:  Basename 05/24/12 0627 05/23/12 0545 05/22/12 0505  NA 138 139 --  K 4.3 3.9 --  CL 96 98 --  CO2 32 30 --  GLUCOSE 113* 146* --  BUN 24* 25* --  CREATININE 1.10 1.26 --  CALCIUM 9.0 9.5 --  MG -- -- 2.1  PHOS -- -- 3.3   Liver Function Tests:  [facility] 05/24/12 0627 05/22/12 0505  AST 44* 14  ALT 39 14  ALKPHOS 40 60  BILITOT 0.8 1.6*  PROT 6.3 6.8  ALBUMIN 2.7* 3.0*    Basename 05/21/12 1939 05/21/12 1308  LIPASE 22 28  AMYLASE -- --   CBC:  Basename 05/24/12 0627 05/23/12 0545 05/22/12 0505 05/21/12 1308  WBC 15.5* 21.0* -- --  NEUTROABS -- -- 15.3* 16.4*  HGB 10.2* 11.1* -- --  HCT 31.0* 33.9* -- --  MCV 98.1 96.0 -- --  PLT 206 244 -- --   Cardiac Enzymes: No results found for this basename: CKTOTAL:3,CKMB:3,CKMBINDEX:3,TROPONINI:3 in the last 72 hours BNP: No components found with this basename: POCBNP:3 D-Dimer: No results found for this basename: DDIMER:2 in the last 72 hours Hemoglobin A1C: No results found for this basename: HGBA1C in the last 72 hours Fasting Lipid Panel:  Basename 05/22/12 0505  CHOL 160  HDL 49  LDLCALC 91  TRIG 98  CHOLHDL 3.3  LDLDIRECT --   Thyroid Function Tests:  Basename 05/22/12 0505  TSH 0.598  T4TOTAL --  T3FREE  --  THYROIDAB --    RADIOLOGY: Dg Neck Soft Tissue  05/21/2012  *RADIOLOGY REPORT*  Clinical Data: Preop for surgery.  The patient has rheumatoid arthritis.  NECK SOFT TISSUES - 1+ VIEW  Comparison: None.  Findings: No soft tissue abnormality, or displacement of the airway is seen.  There is mild disc space narrowing at C4-5, C5-6 and C6- 7.  There is no widening of the predental space at C1-C2. There is no prevertebral soft tissue swelling.  There is moderate to marked bilateral carotid calcification.  Skeletal osteopenia is suggested.  IMPRESSION: Chronic changes as described.  Original Report Authenticated By: Elsie Stain, M.D.   Dg Chest 2 View  05/21/2012  *RADIOLOGY REPORT*  Clinical Data: COPD.  Preop gallbladder surgery.  CHEST - 2 VIEW  Comparison: 09/02/2010  Findings: Shallow inspiration.  Borderline heart size and pulmonary vascularity are likely normal for inspiratory effort. Emphysematous changes and scattered fibrosis in the lungs.  Linear atelectasis or infiltration in the right lung base.  Degenerative changes in the thoracic spine.  Calcified and tortuous aorta.  IMPRESSION: Emphysematous changes and scattered fibrosis in the lungs.  Shallow inspiration with linear atelectasis or infiltration in the right lung base.  Original Report Authenticated By: Marlon Pel, M.D.   Dg Cholangiogram Operative  05/23/2012  *RADIOLOGY REPORT*  Clinical Data:   Cholelithiasis  INTRAOPERATIVE CHOLANGIOGRAM  Technique:  Cholangiographic image from the C-arm fluoroscopic device   submitted for interpretation post-operatively.  Please see the procedural report for the amount of contrast and the fluoroscopy time utilized.  Comparison:  None  Findings:  No   filling defects in the common duct. Intrahepatic ducts are incompletely visualized, appearing decompressed centrally. Contrast passes into the duodenum.  IMPRESSION  Negative for retained common duct stone.  Original Report Authenticated By: Osa Craver, M.D.   Ct Abdomen Pelvis W Contrast  05/21/2012  *RADIOLOGY REPORT*  Clinical Data: Abdominal pain.  Hypertension.  History of cholelithiasis.  Gastroesophageal reflux disease.  CT ABDOMEN AND PELVIS WITH CONTRAST  Technique:  Multidetector CT imaging of the abdomen and pelvis was performed following the standard protocol during bolus administration of intravenous contrast.  Contrast: OMNIPAQUE IOHEXOL 300 MG/ML  SOLN  Comparison: Multiple exams, including 04/12/2012 and 01/24/2012  Findings: Emphysema noted at the lung bases.  Faint peripheral interstitial accentuation suggest mild fibrosis.  Coronary artery atherosclerotic calcification noted with mitral annular calcification.  Oral contrast medium in the distal esophagus is compatible with gastroesophageal reflux.  There is a small hiatal hernia.  Mild atelectasis or scarring noted in the right middle lobe. Linear opacity within the 8 mm nodular component is observed in the lingula on image 12 of series 3; the patient has had similar nodules described on prior exams including 03/29/2011 although this nodule or area of rounded atelectasis appears to be lower in position.  There was previously a greater amount of atelectasis in this location.  Scattered cysts are present in the lateral segment left hepatic lobe.  Dependent density in the gallbladder appears stable and has been previously worked up pound represent tumefactive sludge and possible small gallstones.  Old granulomatous disease of the spleen noted.  The pancreas and adrenal glands appear unremarkable by CT. Bilateral renal cysts noted.  Aortoiliac atherosclerotic calcification is present.  The appendix appears normal.  No pathologic retroperitoneal or porta hepatis adenopathy is identified.  No pathologic pelvic adenopathy is identified.  Urinary bladder appears normal.  There is degenerative arthropathy of both hips, with findings of avascular necrosis or more likely  degenerative arthropathy in the right femoral head.  Chronic lumbar spine endplate compressions noted.  Posterolateral rod pedicle screw fixation noted at the L4-5 level, with grade 1 anterolisthesis of L4 on L5 and multilevel lumbar spondylosis. Mild band of sclerosis along the concave superior endplate of T11 suggests subacute compression fracture at T11.  IMPRESSION:  1.  Gastroesophageal reflux. 2.  Suspected subacute compression fracture along the superior endplate of T11. 3.  Hepatic and renal cysts. 4.  Tumefactive sludge in the gallbladder. 5.  Atherosclerosis. 6.  Degenerative arthropathy of the hips, with subcortical degenerative cysts or mild avascular necrosis in the right femoral head. 7.  8 mm nodular component of a band-like density in the lingula, possibly representing rounded atelectasis or a true pulmonary nodule.  The patient has a history of pulmonary nodules which were seen on CT scan of 03/29/2011; a follow-up CT the chest was recommended on 03/29/2011 but is not apparent in the [facility] system - follow-up CT of the chest is likely warranted if not performed at an outside institution. 8.  Emphysema  with suspected mild fibrosis in the lung bases. 9.  Small hiatal hernia.  Original Report Authenticated By: Dellia Cloud, M.D.    PHYSICAL EXAM He is stable resting in bed. Cardiac exam is S1 and S2. There no clicks or significant murmurs.   TELEMETRY: I personally reviewed telemetry. There is sinus rhythm with sinus bradycardia. He did not have any documented atrial fibrillation during the day yesterday.   ASSESSMENT AND PLAN:    Atrial fibrillation   There is a history of paroxysmal atrial fibrillation. Fortunately his rhythm has remained stable. Plan to continue to watch his rhythm and oriented coagulate him.    Willa Rough 05/24/2012 7:46 AM

## 2012-05-24 NOTE — Progress Notes (Signed)
DAILY PROGRESS NOTE                              [facility]  SUBJECTIVE: Status post laparoscopic cholecystectomy POD #1.  Complaining about 8/10 RUQ pain.  OBJECTIVE: BP 113/60  Pulse 50  Temp(Src) 98.1 F (36.7 C) (Oral)  Resp 20  Ht 6\' 1"  (1.854 m)  Wt 95.89 kg (211 lb 6.4 oz)  BMI 27.89 kg/m2  SpO2 97%  Intake/Output Summary (Last 24 hours) at 05/24/12 0931 Last data filed at 05/24/12 0608  Gross per 24 hour  Intake 3324.16 ml  Output    892 ml  Net 2432.16 ml                      Weight change:  Physical Exam: General: Alert and awake oriented x3 not in any acute distress. HEENT: anicteric sclera, pupils equal reactive to light and accommodation CVS: S1-S2 heard, no murmur rubs or gallops Chest: clear to auscultation bilaterally, no wheezing rales or rhonchi Abdomen:  normal bowel sounds, soft, mild to moderate RUQ tenderness. Neuro: Cranial nerves II-XII intact, no focal neurological deficits Extremities: no cyanosis, no clubbing or edema noted bilaterally   Lab Results:  Basename 05/24/12 0627 05/23/12 0545 05/22/12 0505  NA 138 139 --  K 4.3 3.9 --  CL 96 98 --  CO2 32 30 --  GLUCOSE 113* 146* --  BUN 24* 25* --  CREATININE 1.10 1.26 --  CALCIUM 9.0 9.5 --  MG -- -- 2.1  PHOS -- -- 3.3    Basename 05/24/12 0627 05/22/12 0505  AST 44* 14  ALT 39 14  ALKPHOS 40 60  BILITOT 0.8 1.6*  PROT 6.3 6.8  ALBUMIN 2.7* 3.0*    Basename 05/21/12 1939 05/21/12 1308  LIPASE 22 28  AMYLASE -- --    Alvira Philips 05/24/12 0627 05/23/12 0545 05/22/12 0505 05/21/12 1308  WBC 15.5* 21.0* -- --  NEUTROABS -- -- 15.3* 16.4*  HGB 10.2* 11.1* -- --  HCT 31.0* 33.9* -- --  MCV 98.1 96.0 -- --  PLT 206 244 -- --   No results found for this basename: CKTOTAL:3,CKMB:3,CKMBINDEX:3,TROPONINI:3 in the last 72 hours No components found with this basename: POCBNP:3 No results found for this basename: DDIMER:2 in the last 72 hours No  results found for this basename: HGBA1C:2 in the last 72 hours  Basename 05/22/12 0505  CHOL 160  HDL 49  LDLCALC 91  TRIG 98  CHOLHDL 3.3  LDLDIRECT --    Basename 05/22/12 0505  TSH 0.598  T4TOTAL --  T3FREE --  THYROIDAB --   No results found for this basename: VITAMINB12:2,FOLATE:2,FERRITIN:2,TIBC:2,IRON:2,RETICCTPCT:2 in the last 72 hours  Micro Results: Recent Results (from the past 240 hour(s))  CULTURE, BLOOD (ROUTINE X 2)     Status: Normal (Preliminary result)   Collection Time   05/21/12  7:47 PM      Component Value Range Status Comment   Specimen Description BLOOD ARM RIGHT   Final    Special Requests BOTTLES DRAWN AEROBIC AND ANAEROBIC 10CC   Final    Culture  Setup Time [id number]   Final    Culture     Final    Value:        BLOOD CULTURE RECEIVED NO GROWTH TO DATE CULTURE WILL BE HELD FOR 5 DAYS BEFORE ISSUING A FINAL NEGATIVE REPORT   Report Status PENDING  Incomplete   CULTURE, BLOOD (ROUTINE X 2)     Status: Normal (Preliminary result)   Collection Time   05/21/12  7:56 PM      Component Value Range Status Comment   Specimen Description BLOOD HAND LEFT   Final    Special Requests BOTTLES DRAWN AEROBIC ONLY 5CC   Final    Culture  Setup Time [id number]   Final    Culture     Final    Value:        BLOOD CULTURE RECEIVED NO GROWTH TO DATE CULTURE WILL BE HELD FOR 5 DAYS BEFORE ISSUING A FINAL NEGATIVE REPORT   Report Status PENDING   Incomplete   URINE CULTURE     Status: Normal   Collection Time   05/22/12  7:59 AM      Component Value Range Status Comment   Specimen Description URINE, RANDOM   Final    Special Requests NONE   Final    Culture  Setup Time [id number]   Final    Colony Count NO GROWTH   Final    Culture NO GROWTH   Final    Report Status 05/23/2012 FINAL   Final   SURGICAL PCR SCREEN     Status: Abnormal   Collection Time   05/23/12  5:16 AM      Component Value Range Status Comment   MRSA, PCR POSITIVE (*) NEGATIVE  Final      Staphylococcus aureus POSITIVE (*) NEGATIVE  Final     Studies/Results: Dg Neck Soft Tissue  05/21/2012  *RADIOLOGY REPORT*  Clinical Data: Preop for surgery.  The patient has rheumatoid arthritis.  NECK SOFT TISSUES - 1+ VIEW  Comparison: None.  Findings: No soft tissue abnormality, or displacement of the airway is seen.  There is mild disc space narrowing at C4-5, C5-6 and C6- 7.  There is no widening of the predental space at C1-C2. There is no prevertebral soft tissue swelling.  There is moderate to marked bilateral carotid calcification.  Skeletal osteopenia is suggested.  IMPRESSION: Chronic changes as described.  Original Report Authenticated By: Elsie Stain, M.D.   Dg Chest 2 View  05/21/2012  *RADIOLOGY REPORT*  Clinical Data: COPD.  Preop gallbladder surgery.  CHEST - 2 VIEW  Comparison: 09/02/2010  Findings: Shallow inspiration.  Borderline heart size and pulmonary vascularity are likely normal for inspiratory effort. Emphysematous changes and scattered fibrosis in the lungs.  Linear atelectasis or infiltration in the right lung base.  Degenerative changes in the thoracic spine.  Calcified and tortuous aorta.  IMPRESSION: Emphysematous changes and scattered fibrosis in the lungs.  Shallow inspiration with linear atelectasis or infiltration in the right lung base.  Original Report Authenticated By: Marlon Pel, M.D.   Ct Abdomen Pelvis W Contrast  05/21/2012  *RADIOLOGY REPORT*  Clinical Data: Abdominal pain.  Hypertension.  History of cholelithiasis.  Gastroesophageal reflux disease.  CT ABDOMEN AND PELVIS WITH CONTRAST  Technique:  Multidetector CT imaging of the abdomen and pelvis was performed following the standard protocol during bolus administration of intravenous contrast.  Contrast: OMNIPAQUE IOHEXOL 300 MG/ML  SOLN  Comparison: Multiple exams, including 04/12/2012 and 01/24/2012  Findings: Emphysema noted at the lung bases.  Faint peripheral interstitial accentuation  suggest mild fibrosis.  Coronary artery atherosclerotic calcification noted with mitral annular calcification.  Oral contrast medium in the distal esophagus is compatible with gastroesophageal reflux.  There is a small hiatal hernia.  Mild atelectasis or scarring noted  in the right middle lobe. Linear opacity within the 8 mm nodular component is observed in the lingula on image 12 of series 3; the patient has had similar nodules described on prior exams including 03/29/2011 although this nodule or area of rounded atelectasis appears to be lower in position.  There was previously a greater amount of atelectasis in this location.  Scattered cysts are present in the lateral segment left hepatic lobe.  Dependent density in the gallbladder appears stable and has been previously worked up pound represent tumefactive sludge and possible small gallstones.  Old granulomatous disease of the spleen noted.  The pancreas and adrenal glands appear unremarkable by CT. Bilateral renal cysts noted.  Aortoiliac atherosclerotic calcification is present.  The appendix appears normal.  No pathologic retroperitoneal or porta hepatis adenopathy is identified.  No pathologic pelvic adenopathy is identified.  Urinary bladder appears normal.  There is degenerative arthropathy of both hips, with findings of avascular necrosis or more likely degenerative arthropathy in the right femoral head.  Chronic lumbar spine endplate compressions noted.  Posterolateral rod pedicle screw fixation noted at the L4-5 level, with grade 1 anterolisthesis of L4 on L5 and multilevel lumbar spondylosis. Mild band of sclerosis along the concave superior endplate of T11 suggests subacute compression fracture at T11.  IMPRESSION:  1.  Gastroesophageal reflux. 2.  Suspected subacute compression fracture along the superior endplate of T11. 3.  Hepatic and renal cysts. 4.  Tumefactive sludge in the gallbladder. 5.  Atherosclerosis. 6.  Degenerative arthropathy of the  hips, with subcortical degenerative cysts or mild avascular necrosis in the right femoral head. 7.  8 mm nodular component of a band-like density in the lingula, possibly representing rounded atelectasis or a true pulmonary nodule.  The patient has a history of pulmonary nodules which were seen on CT scan of 03/29/2011; a follow-up CT the chest was recommended on 03/29/2011 but is not apparent in the [facility] system - follow-up CT of the chest is likely warranted if not performed at an outside institution. 8.  Emphysema with suspected mild fibrosis in the lung bases. 9.  Small hiatal hernia.  Original Report Authenticated By: Dellia Cloud, M.D.   Medications: Scheduled Meds:    . Chlorhexidine Gluconate Cloth  6 each Topical Q0600  . cloNIDine  0.1 mg Oral BID  . diltiazem  180 mg Oral Daily  . folic acid  1 mg Oral Daily  . furosemide  40 mg Oral BID  . gabapentin  300 mg Oral TID  . HYDROmorphone      . HYDROmorphone      . loratadine  10 mg Oral Daily  . methotrexate  17.5 mg Oral Weekly  . methylPREDNISolone (SOLU-MEDROL) injection  40 mg Intravenous Daily  . mupirocin ointment  1 application Nasal BID  . pantoprazole  40 mg Oral Q1200  . piperacillin-tazobactam  3.375 g Intravenous Q8H  . piperacillin-tazobactam  3.375 g Intravenous To OR  . potassium chloride SA  20 mEq Oral BID  . senna  1 tablet Oral BID  . sodium chloride  3 mL Intravenous Q12H   Continuous Infusions:    . lactated ringers 50 mL/hr at 05/23/12 1145   PRN Meds:.acetaminophen, acetaminophen, albuterol, HYDROcodone-acetaminophen, morphine injection, ondansetron (ZOFRAN) IV, ondansetron, zolpidem, DISCONTD: 0.9 % irrigation (POUR BTL), DISCONTD: bupivacaine-EPINEPHrine, DISCONTD: hemostatic agents, DISCONTD:  HYDROmorphone (DILAUDID) injection, DISCONTD: lidocaine, DISCONTD:  morphine injection, DISCONTD: Omnipaque 300 mg/mL (50 mL) in 0.9% normal saline (50 mL), DISCONTD: sodium chloride  irrigation  ASSESSMENT & PLAN: Active Problems:  Atrial fibrillation  Lupus anticoagulant positive  Acute acalculous cholecystitis  COPD (chronic obstructive pulmonary disease)  Long term current use of anticoagulant  Obesity (BMI 35.0-39.9 without comorbidity)  HTN (hypertension)  Arthritis   Acute and chronic acalculous cholecystitis -Patient admitted to telemetry, the time of admission WBCs were 16,000. -Patient started on Zosyn, WBC is still elevated, but trending down. -Status post laparoscopic cholecystectomy on 05/23/2012.  Atrial fibrillation -NSR, patient is on chronic anticoagulation with Coumadin. -Patient is off of his Coumadin for the surgery today. I will restart Coumadin tonight if okay with general surgery. -INR is 1.2 today.  Hypercoagulable status -Antiphospholipid syndrome with positive lupus anticoagulant. -Patient is on Coumadin, that's on hold because of upcoming surgery.  Rheumatoid arthritis -Stable chronic condition no evidence of flareup. -Patient is on Enbrel and methotrexate at home, continued.  COPD -Patient was placed on IV Solu-Medrol because of likely to decompensate, will switch back to prednisone. -Continue bronchodilators, oxygen and incentive spirometry.    LOS: 3 days   Bryleigh Ottaway A 05/24/2012, 9:31 AM

## 2012-05-25 DIAGNOSIS — Z7901 Long term (current) use of anticoagulants: Secondary | ICD-10-CM

## 2012-05-25 DIAGNOSIS — J438 Other emphysema: Secondary | ICD-10-CM

## 2012-05-25 DIAGNOSIS — I4891 Unspecified atrial fibrillation: Secondary | ICD-10-CM

## 2012-05-25 DIAGNOSIS — D72829 Elevated white blood cell count, unspecified: Secondary | ICD-10-CM | POA: Diagnosis present

## 2012-05-25 DIAGNOSIS — K810 Acute cholecystitis: Secondary | ICD-10-CM

## 2012-05-25 LAB — CBC
HCT: 33.9 % — ABNORMAL LOW (ref 39.0–52.0)
Hemoglobin: 11 g/dL — ABNORMAL LOW (ref 13.0–17.0)
MCH: 31.9 pg (ref 26.0–34.0)
MCHC: 32.4 g/dL (ref 30.0–36.0)
MCV: 98.3 fL (ref 78.0–100.0)
RDW: 17.5 % — ABNORMAL HIGH (ref 11.5–15.5)

## 2012-05-25 LAB — COMPREHENSIVE METABOLIC PANEL
AST: 33 U/L (ref 0–37)
BUN: 21 mg/dL (ref 6–23)
CO2: 30 mEq/L (ref 19–32)
Calcium: 8.8 mg/dL (ref 8.4–10.5)
Chloride: 97 mEq/L (ref 96–112)
Creatinine, Ser: 1.19 mg/dL (ref 0.50–1.35)
GFR calc Af Amer: 66 mL/min — ABNORMAL LOW (ref >90)
GFR calc non Af Amer: 57 mL/min — ABNORMAL LOW (ref >90)
Glucose, Bld: 98 mg/dL (ref 70–99)
Total Bilirubin: 0.5 mg/dL (ref 0.3–1.2)

## 2012-05-25 LAB — PROTIME-INR: INR: 1.17 (ref 0.00–1.49)

## 2012-05-25 MED ORDER — AMOXICILLIN-POT CLAVULANATE 875-125 MG PO TABS: 1.0000 | ORAL_TABLET | Freq: Two times a day (BID) | ORAL | Status: AC

## 2012-05-25 MED ORDER — ENOXAPARIN SODIUM 150 MG/ML ~~LOC~~ SOLN: 150.0000 mg | Freq: Every day | SUBCUTANEOUS | Status: DC

## 2012-05-25 MED ORDER — POTASSIUM CHLORIDE CRYS ER 20 MEQ PO TBCR
40.0000 meq | EXTENDED_RELEASE_TABLET | Freq: Once | ORAL | Status: AC
  Administered 2012-05-25: 40 meq via ORAL

## 2012-05-25 MED ORDER — OXYCODONE-ACETAMINOPHEN 10-325 MG PO TABS: 1.0000 | ORAL_TABLET | Freq: Three times a day (TID) | ORAL | Status: DC | PRN

## 2012-05-25 NOTE — Progress Notes (Signed)
IV d/c'd.  Tele d/c'd.  Pt d/c'd to home.  Home meds and d/c instructions reviewed with pt.  Pt JP drain in place.  Pt and family instructed on how to empty and recharge bulb per MD request.  Pt denies any questions or concerns at this time.  Pt leaving unit via wheelchair and appears in no acute distress. Nino Glow RN

## 2012-05-25 NOTE — Progress Notes (Signed)
Pt had change in rhythm from nsr to afib with hr 110 120s. Pt asymptomatic. Lenny Pastel made aware of rhythm change and increased hr stated to call Dr Myrtis Ser the  Cardiologist who following the patient's case. Will continue to monitor patient.

## 2012-05-25 NOTE — Discharge Summary (Signed)
HOSPITAL DISCHARGE SUMMARY  [name]  MRN: [id number]  DOB:[birth year]  Date of Admission: 05/21/2012 Date of Discharge: 05/25/2012         LOS: 4 days   Attending Physician:  Clydia Llano A  Patient's PCP:  Josue Hector, MD, MD  Consults: Treatment Team:  Md Montez Morita, MD [facility], MD   Discharge Diagnoses: Present on Admission:  .Acute acalculous cholecystitis .COPD (chronic obstructive pulmonary disease) .Chronic a-fib .Obesity (BMI 35.0-39.9 without comorbidity) .HTN (hypertension) .Arthritis .Atrial fibrillation .Lupus anticoagulant positive .Leukocytosis   Medication List  As of 05/25/2012  3:06 PM   TAKE these medications         albuterol (2.5 MG/3ML) 0.083% nebulizer solution   Commonly known as: PROVENTIL   Take 2.5 mg by nebulization every 6 (six) hours as needed. Shortness of Breath      PROAIR HFA 108 (90 BASE) MCG/ACT inhaler   Generic drug: albuterol   Inhale 2 puffs into the lungs every 6 (six) hours as needed. Shortness of Breath      amoxicillin-clavulanate 875-125 MG per tablet   Commonly known as: AUGMENTIN   Take 1 tablet by mouth 2 (two) times daily.      cetirizine 10 MG tablet   Commonly known as: ZYRTEC   Take 10 mg by mouth daily.      cloNIDine 0.1 MG tablet   Commonly known as: CATAPRES   Take 0.1 mg by mouth 2 (two) times daily.      diltiazem 180 MG 24 hr capsule   Commonly known as: CARDIZEM CD   Take 1 capsule (180 mg total) by mouth daily.      ENBREL 25 MG/0.5ML Soln   Generic drug: Etanercept   Inject 25 mg into the skin 2 (two) times a week.      enoxaparin 150 MG/ML injection   Commonly known as: LOVENOX   Inject 1 mL (150 mg total) into the skin daily.      Fish Oil 1000 MG Caps   Take 1 capsule by mouth 2 (two) times daily.      folic acid 1 MG tablet   Commonly known as: FOLVITE   Take 1 mg by mouth daily.      furosemide 40 MG tablet   Commonly known as: LASIX   Take 40 mg by mouth 2  (two) times daily.      gabapentin 300 MG capsule   Commonly known as: NEURONTIN   Take 300 mg by mouth 3 (three) times daily.      methotrexate 2.5 MG tablet   Commonly known as: RHEUMATREX   Take 17.5 mg by mouth once a week. Takes 7 tablets on Friday      omeprazole 20 MG capsule   Commonly known as: PRILOSEC   Take 20 mg by mouth 2 (two) times daily.      oxyCODONE-acetaminophen 10-325 MG per tablet   Commonly known as: PERCOCET   Take 1 tablet by mouth every 8 (eight) hours as needed for pain.      potassium chloride SA 20 MEQ tablet   Commonly known as: K-DUR,KLOR-CON   Take 20 mEq by mouth 2 (two) times daily.      warfarin 2 MG tablet   Commonly known as: COUMADIN   Take 2 mg by mouth daily.             Brief Admission History: [name] was referred to [facility] ED for surgical evaluation for abdominal pain. He  has gallbladder sludge on exam, and was seen by Dr Corliss Skains, who asked [place] to admit due to multiple medical problems, including afib on coumadin/copd/obesity/rheumatoid arthritis on immunosuppressants/lupus anticoagulant positive. His wbc was noted to be 20000 today. He continues to have abdominal pain in the right upper quadrant.  Hospital Course: Present on Admission:  .Acute acalculous cholecystitis .COPD (chronic obstructive pulmonary disease) .Chronic a-fib .Obesity (BMI 35.0-39.9 without comorbidity) .HTN (hypertension) .Arthritis .Atrial fibrillation .Lupus anticoagulant positive .Leukocytosis  1. Acute and chronic acalculous cholecystitis: Patient was sent from center [place] [facility] offices for semielective laparoscopic cholecystectomy, patient admitted to the medical service because of his medical comorbidities. After patient admitted to the hospital general surgery was following during this hospital stay. Initially cardiology was consulted to clear him for surgery, cardiology recommended to proceed to surgery without invasive evaluation. Patient  undergone his laparoscopic cholecystectomy on 05/23/2012 done by Dr. Donell Beers. After surgery the patient tolerated his diet well and he was felt to be appropriate for discharge.  2. Leukocytosis: This is thought to be secondary to his acute cholecystitis, patient was on Zosyn while is in the hospital after the cholecystectomy patient was sent home on Augmentin for 5 more days. His white blood count went up to 21,000 the day of discharge it was 12.5. Patient was afebrile.  3. Atrial fibrillation: Patient was placed on telemetry and because of this cardiology was consulted, as mentioned above they recommended to proceed to surgery without invasive evaluation. Patient does have paroxysmal atrial fibrillation and he was normal sinus rhythm most of the time during the hospital. After the surgery he had episode of A. fib with RVR, he did not feel any palpitations. But he did not persist for a long time and patient was back to normal sinus rhythm. Patient back on his Coumadin, this should be followed by his primary care physician and his cardiologist as outpatient.  4. Hypercoagulable status: Patient has antiphospholipid syndrome with positive lupus anticoagulant, he is on Coumadin this is was held because of surgery of the day of discharge his INR is 1.1. Patient was sent home on Lovenox bridge. He was sent home on Lovenox 150 mg subcutaneously daily for 5 days. Patient to followup with his primary care physician to check the INR.  5. Rheumatoid arthritis: This is chronic stable condition, no evidence of flare up while he was in the hospital. Patient is on Enbrel and methotrexate at home this was held during the hospital stay because of her acute infection and it was restarted at time of discharge.  6. COPD: No dose of flare up while in the hospital. Patient initially was placed on IV Solu-Medrol expecting that he is going to decompensate, but this was discontinued soon and was placed back on his bronchodilator  and incentive spirometry.  Day of Discharge BP 136/70  Pulse 72  Temp(Src) 98 F (36.7 C) (Oral)  Resp 18  Ht 6\' 1"  (1.854 m)  Wt 97.75 kg (215 lb 8 oz)  BMI 28.43 kg/m2  SpO2 92% Physical Exam: GEN: No acute distress, cooperative with exam PSYCH: He is alert and oriented x4; does not appear anxious does not appear depressed; affect is normal  HEENT: Mucous membranes pink and anicteric;  Mouth: without oral thrush or lesions Eyes: PERRLA; EOM intact;  Neck: no cervical lymphadenopathy nor thyromegaly or carotid bruit; no JVD;  CHEST WALL: No tenderness, symmetrical to breathing bilaterally CHEST: Normal respiration, clear to auscultation bilaterally  HEART: Regular rate and rhythm; no murmurs, rubs  or gallops, S1 and S2 heard  BACK: No kyphosis or scoliosis; no CVA tenderness  ABDOMEN:  soft non-tender; no masses, no organomegaly, normal abdominal bowel sounds; no pannus; no intertriginous candida.  EXTREMITIES: No bone or joint deformity; no edema; no ulcerations.  PULSES: 2+ and symmetric, neurovascularity is intact SKIN: Normal hydration no rash or ulceration, no flushing or suspicious lesions  CNS: Cranial nerves 2-12 grossly intact no focal neurologic deficit, coordination is intact gait not tested    Results for orders placed during the hospital encounter of 05/21/12 (from the past 24 hour(s))  CBC     Status: Abnormal   Collection Time   05/25/12  6:00 AM      Component Value Range   WBC 12.5 (*) 4.0 - 10.5 (K/uL)   RBC 3.45 (*) 4.22 - 5.81 (MIL/uL)   Hemoglobin 11.0 (*) 13.0 - 17.0 (g/dL)   HCT 16.1 (*) 09.6 - 52.0 (%)   MCV 98.3  78.0 - 100.0 (fL)   MCH 31.9  26.0 - 34.0 (pg)   MCHC 32.4  30.0 - 36.0 (g/dL)   RDW 04.5 (*) 40.9 - 15.5 (%)   Platelets 178  150 - 400 (K/uL)  PROTIME-INR     Status: Normal   Collection Time   05/25/12  6:00 AM      Component Value Range   Prothrombin Time 15.1  11.6 - 15.2 (seconds)   INR 1.17  0.00 - 1.49   COMPREHENSIVE  METABOLIC PANEL     Status: Abnormal   Collection Time   05/25/12  6:00 AM      Component Value Range   Sodium 139  135 - 145 (mEq/L)   Potassium 3.4 (*) 3.5 - 5.1 (mEq/L)   Chloride 97  96 - 112 (mEq/L)   CO2 30  19 - 32 (mEq/L)   Glucose, Bld 98  70 - 99 (mg/dL)   BUN 21  6 - 23 (mg/dL)   Creatinine, Ser 8.11  0.50 - 1.35 (mg/dL)   Calcium 8.8  8.4 - 91.4 (mg/dL)   Total Protein 6.5  6.0 - 8.3 (g/dL)   Albumin 2.7 (*) 3.5 - 5.2 (g/dL)   AST 33  0 - 37 (U/L)   ALT 40  0 - 53 (U/L)   Alkaline Phosphatase 49  39 - 117 (U/L)   Total Bilirubin 0.5  0.3 - 1.2 (mg/dL)   GFR calc non Af Amer 57 (*) >90 (mL/min)   GFR calc Af Amer 66 (*) >90 (mL/min)    Disposition: Home   Follow-up Appts: Discharge Orders    Future Appointments: Provider: Department: Dept Phone: Center:   06/12/2012 10:10 AM Shelly Rubenstein, MD [facility] [phone] None      Follow-up Information    Follow up with [facility], MD in 2 weeks.   Contact information:   [employer] [address] [place] [address] [phone]       Follow up with Josue Hector, MD in 1 week.   Contact information:   [address] [place] [address] [phone]          I spent 40 minutes completing paperwork and coordinating discharge efforts.  SignedClydia Llano A 05/25/2012, 3:06 PM

## 2012-05-25 NOTE — Progress Notes (Signed)
[facility] Progress Note 2 Days Post-Op  Subjective: Patient doing well.  Wants to go home.  Okay to restart coumadin today.  Objective: Vital signs in last 24 hours: Temp:  [98 F (36.7 C)-98.5 F (36.9 C)] 98.2 F (36.8 C) (05/31 0500) Pulse Rate:  [49-77] 74  (05/31 1036) Resp:  [18-20] 18  (05/30 2146) BP: (116-144)/(67-76) 134/74 mmHg (05/31 1036) SpO2:  [90 %-91 %] 91 % (05/30 2146) Weight:  [97.75 kg (215 lb 8 oz)] 97.75 kg (215 lb 8 oz) (05/31 0500) Last BM Date: 05/24/12  Intake/Output from previous day: 05/30 0701 - 05/31 0700 In: 1918.3 [P.O.:1080; I.V.:608.3; IV Piggyback:150] Out: 2555 [Urine:2475; Drains:80] Intake/Output this shift: Total I/O In: 483 [P.O.:480; I.V.:3] Out: 70 [Drains:70]  General: No acute distress  Lungs: Clear  Abd: Afot, good bowel sounds., Drain with minimal output of old bloody drainage.  Extremities: No signs of DVT  Neuro: Intact  Lab Results:  @LABLAST2 (wbc:2,hgb:2,hct:2,plt:2) BMET  Basename 05/25/12 0600 05/24/12 0627  NA 139 138  K 3.4* 4.3  CL 97 96  CO2 30 32  GLUCOSE 98 113*  BUN 21 24*  CREATININE 1.19 1.10  CALCIUM 8.8 9.0   PT/INR  Basename 05/25/12 0600 05/23/12 0545  LABPROT 15.1 15.6*  INR 1.17 1.21   ABG No results found for this basename: PHART:2,PCO2:2,PO2:2,HCO3:2 in the last 72 hours  Studies/Results: Dg Cholangiogram Operative  05/23/2012  *RADIOLOGY REPORT*  Clinical Data:   Cholelithiasis  INTRAOPERATIVE CHOLANGIOGRAM  Technique:  Cholangiographic image from the C-arm fluoroscopic device   submitted for interpretation post-operatively.  Please see the procedural report for the amount of contrast and the fluoroscopy time utilized.  Comparison:  None  Findings:  No   filling defects in the common duct. Intrahepatic ducts are incompletely visualized, appearing decompressed centrally. Contrast passes into the duodenum.  IMPRESSION  Negative for retained common duct stone.  Original Report  Authenticated By: Osa Craver, M.D.    Anti-infectives: Anti-infectives     Start     Dose/Rate Route Frequency Ordered Stop   05/23/12 1215  piperacillin-tazobactam (ZOSYN) IVPB 3.375 g       3.375 g 100 mL/hr over 30 Minutes Intravenous To Surgery 05/23/12 1205 05/23/12 1221   05/21/12 2200  piperacillin-tazobactam (ZOSYN) IVPB 3.375 g       3.375 g 12.5 mL/hr over 4 Hours Intravenous 3 times per day 05/21/12 2130            Assessment/Plan: s/p Procedure(s): LAPAROSCOPIC CHOLECYSTECTOMY WITH INTRAOPERATIVE CHOLANGIOGRAM Advance diet Discharge Restart coumadin. Followup to see Dr. Donell Beers as outpatient in 2 weeks.  LOS: 4 days   Marta Lamas. Gae Bon, MD, FACS [phone] [facility] Surgery 05/25/2012

## 2012-05-25 NOTE — Discharge Instructions (Signed)
Laparoscopic Cholecystectomy Care After Refer to this sheet in the next few weeks. These instructions provide you with information on caring for yourself after your procedure. Your caregiver may also give you more specific instructions. Your treatment has been planned according to current medical practices, but problems sometimes occur. Call your caregiver if you have any problems or questions after your procedure. HOME CARE INSTRUCTIONS   Change bandages (dressings) as directed by your caregiver.   Keep the wound dry and clean. The wound may be washed gently with soap and water. Gently blot or dab the area dry.   Do not take baths or use swimming pools or hot tubs for 10 days, or as instructed by your caregiver.   Only take over-the-counter or prescription medicines for pain, discomfort, or fever as directed by your caregiver.   Continue your normal diet as directed by your caregiver.   Do not lift anything heavier than 15 pounds (11.5 kg) for 3-4 weeks.  Do not play contact sports for 4 weeks, or as directed by your caregiver.  SEEK MEDICAL CARE IF:   There is redness, swelling, or increasing pain in the wound.   You notice yellowish-white fluid (pus) coming from the wound.   There is drainage from the wound that lasts longer than 1 day.   There is a bad smell coming from the wound or dressing.   The surgical cut (incision) breaks open.  SEEK IMMEDIATE MEDICAL CARE IF:   You develop a rash.   You have difficulty breathing.   You develop chest pain.   You develop any reaction or side effects to medicines given.   You have a fever.  Your skin or eyes are yellow.  You have increasing pain in the shoulders (shoulder strap areas).   You have dizzy episodes or faint while standing.   You develop severe abdominal pain.   You feel sick to your stomach (nauseous) or throw up (vomit) and this lasts for more than 1 day.  MAKE SURE YOU:   Understand these instructions.    Will watch your condition.   Will get help right away if you are not doing well or get worse.

## 2012-05-25 NOTE — Progress Notes (Signed)
UR Completed. Lonell Stamos, RN, Nurse Case Manager [phone]     

## 2012-05-25 NOTE — Progress Notes (Signed)
   SUBJECTIVE:  Denies SOB.  Didn't feel palpitations.  Mild surgical pain   PHYSICAL EXAM Filed Vitals:   05/24/12 1212 05/24/12 1419 05/24/12 2146 05/25/12 0500  BP: 116/67 124/68 126/69 144/76  Pulse: 49 65 71 77  Temp:  98.5 F (36.9 C) 98 F (36.7 C) 98.2 F (36.8 C)  TempSrc:  Oral Oral Oral  Resp:  20 18   Height:      Weight:      SpO2:  90% 91%    General:  No distress Lungs:  Clear Heart:  RRR Abdomen:  Positive bowel sounds, no rebound no guarding Extremities:  No edema.  LABS: Lab Results  Component Value Date   CKTOTAL 43 09/03/2010   CKMB 1.3 09/03/2010   TROPONINI  Value: 0.02        NO INDICATION OF MYOCARDIAL INJURY. 09/03/2010   Results for orders placed during the hospital encounter of 05/21/12 (from the past 24 hour(s))  CBC     Status: Abnormal   Collection Time   05/25/12  6:00 AM      Component Value Range   WBC 12.5 (*) 4.0 - 10.5 (K/uL)   RBC 3.45 (*) 4.22 - 5.81 (MIL/uL)   Hemoglobin 11.0 (*) 13.0 - 17.0 (g/dL)   HCT 19.1 (*) 47.8 - 52.0 (%)   MCV 98.3  78.0 - 100.0 (fL)   MCH 31.9  26.0 - 34.0 (pg)   MCHC 32.4  30.0 - 36.0 (g/dL)   RDW 29.5 (*) 62.1 - 15.5 (%)   Platelets 178  150 - 400 (K/uL)  PROTIME-INR     Status: Normal   Collection Time   05/25/12  6:00 AM      Component Value Range   Prothrombin Time 15.1  11.6 - 15.2 (seconds)   INR 1.17  0.00 - 1.49     Intake/Output Summary (Last 24 hours) at 05/25/12 3086 Last data filed at 05/25/12 5784  Gross per 24 hour  Intake 1918.34 ml  Output   2155 ml  Net -236.66 ml    ASSESSMENT AND PLAN:  1)  Atrial fibrillation:  He had an episode of this last night.  He doesn't notice it.  Now in sinus.  Continue current meds.  Apparently he is going home with his drains in.  We will need surgery to comment on when he can start warfarin.  Ok to be of for the short term as needed.  2)  Diastolic HF:  He seems to be euvolemic.  At this point, no change in therapy is indicated.  We have reviewed  salt and fluid restrictions.  No further cardiovascular testing is indicated.    Fayrene Fearing [facility] 05/25/2012 7:23 AM

## 2012-05-25 NOTE — Progress Notes (Signed)
Dr Margo Aye made aware of patients change in rhythm. No orders received. Pt asymptomatic. Continued to monitor patient closely

## 2012-05-25 NOTE — Progress Notes (Signed)
Pt converted from Afib to NSR at 6:55 this am.  Stat EKG performed to confirm rhythm.  MD has been made aware.  Will continue to monitor. Nino Glow RN

## 2012-05-28 LAB — CULTURE, BLOOD (ROUTINE X 2): Culture  Setup Time: 201305280231

## 2012-05-30 ENCOUNTER — Telehealth: Payer: Self-pay | Admitting: *Deleted

## 2012-05-30 NOTE — Telephone Encounter (Signed)
Patient should not require any further cardiac workup prior to elective cholecystectomy. We are following him for atrial fibrillation and this was stable recently. Recent echocardiogram reviewed and also stable. He is on chronic Coumadin with additional history of pulmonary embolus and lupus anticoagulant. Since he will need to come off of Coumadin for surgery, he will need to be bridged with Lovenox. Please ensure that this is coordinated with the Coumadin clinic.  ----- Message ----- From: Eustace Moore, LPN Sent: 1/61/0960 11:18 AM To: Jonelle Sidle, MD  REQUEST FOR CLEARANCE FOR GALL BLADDER SURGERY

## 2012-06-12 ENCOUNTER — Encounter (INDEPENDENT_AMBULATORY_CARE_PROVIDER_SITE_OTHER): Payer: Self-pay | Admitting: General Surgery

## 2012-06-12 ENCOUNTER — Ambulatory Visit (INDEPENDENT_AMBULATORY_CARE_PROVIDER_SITE_OTHER): Payer: Medicare PPO | Admitting: General Surgery

## 2012-06-12 ENCOUNTER — Encounter (INDEPENDENT_AMBULATORY_CARE_PROVIDER_SITE_OTHER): Payer: Medicare PPO | Admitting: Surgery

## 2012-06-12 VITALS — BP 130/70 | HR 78 | Temp 97.6°F | Resp 14 | Ht 73.0 in | Wt 216.2 lb

## 2012-06-12 DIAGNOSIS — K810 Acute cholecystitis: Secondary | ICD-10-CM

## 2012-06-12 NOTE — Patient Instructions (Addendum)
No heavy lifting for another 2-4 weeks.  May shower/bathe.  Wear dressing over drain site while draining.

## 2012-06-12 NOTE — Progress Notes (Signed)
HISTORY:  Patient is a 78 year old male with multiple problems on Coumadin and presented with what was presumed to be acalculous cholecystitis. He underwent laparoscopic cholecystectomy around 3 weeks ago. Due to the gangrenous nature of the gallbladder a drain was left. He is doing reasonably well at home with no fevers or chills. He denies nausea and vomiting. He has had significant soreness at the drain site. He denies diarrhea.   EXAM: General:  Alert and oriented x3.  No distress Abd:  Incisions healing well.  Drain minimal serosanguinous.  Non bilious.  No purulence.     PATHOLOGY: PARTIALLY DENUDED GALLBLADDER MUCOSA WITH UNDERLYING ACUTE AND CHRONIC CHOLECYSTITIS. - CHOLELITHIASIS PRESENT. - ABUNDANT ADHERENT BENIGN LIVER PARENCHYMA WITH ACUTE INFLAMMATION. - NO TUMOR SEEN. - SEE COMMENT.   ASSESSMENT AND PLAN:   Acute acalculous cholecystitis Doing well.  Drain d/c'd  Follow up as needed.        Maudry Diego, MD Surgical Oncology, General & Endocrine Surgery [facility]  Josue Hector, MD [place], Harlon Ditty,*

## 2012-06-12 NOTE — Assessment & Plan Note (Signed)
Doing well.  Drain d/c'd  Follow up as needed.

## 2012-06-19 ENCOUNTER — Ambulatory Visit: Payer: No Typology Code available for payment source | Admitting: Cardiology

## 2012-07-25 ENCOUNTER — Encounter: Payer: Self-pay | Admitting: Internal Medicine

## 2012-07-31 ENCOUNTER — Other Ambulatory Visit: Payer: Self-pay | Admitting: Cardiology

## 2012-09-27 ENCOUNTER — Other Ambulatory Visit: Payer: Self-pay | Admitting: Cardiology

## 2012-10-12 ENCOUNTER — Encounter (HOSPITAL_COMMUNITY): Payer: Self-pay | Admitting: *Deleted

## 2012-10-12 ENCOUNTER — Inpatient Hospital Stay (HOSPITAL_COMMUNITY)
Admission: EM | Admit: 2012-10-12 | Discharge: 2012-10-14 | DRG: 194 | Disposition: A | Discharge status: Home / Self Care | Payer: Medicare PPO | Attending: Internal Medicine | Admitting: Internal Medicine

## 2012-10-12 ENCOUNTER — Emergency Department (HOSPITAL_COMMUNITY): Payer: Medicare PPO

## 2012-10-12 DIAGNOSIS — R911 Solitary pulmonary nodule: Secondary | ICD-10-CM | POA: Diagnosis present

## 2012-10-12 DIAGNOSIS — D72829 Elevated white blood cell count, unspecified: Secondary | ICD-10-CM

## 2012-10-12 DIAGNOSIS — J189 Pneumonia, unspecified organism: Principal | ICD-10-CM | POA: Diagnosis present

## 2012-10-12 DIAGNOSIS — I2699 Other pulmonary embolism without acute cor pulmonale: Secondary | ICD-10-CM | POA: Diagnosis present

## 2012-10-12 DIAGNOSIS — K7460 Unspecified cirrhosis of liver: Secondary | ICD-10-CM | POA: Diagnosis present

## 2012-10-12 DIAGNOSIS — Z9981 Dependence on supplemental oxygen: Secondary | ICD-10-CM

## 2012-10-12 DIAGNOSIS — Z79899 Other long term (current) drug therapy: Secondary | ICD-10-CM

## 2012-10-12 DIAGNOSIS — Z7901 Long term (current) use of anticoagulants: Secondary | ICD-10-CM

## 2012-10-12 DIAGNOSIS — M1990 Unspecified osteoarthritis, unspecified site: Secondary | ICD-10-CM | POA: Diagnosis present

## 2012-10-12 DIAGNOSIS — Z86711 Personal history of pulmonary embolism: Secondary | ICD-10-CM

## 2012-10-12 DIAGNOSIS — E861 Hypovolemia: Secondary | ICD-10-CM | POA: Diagnosis present

## 2012-10-12 DIAGNOSIS — J4489 Other specified chronic obstructive pulmonary disease: Secondary | ICD-10-CM | POA: Diagnosis present

## 2012-10-12 DIAGNOSIS — R609 Edema, unspecified: Secondary | ICD-10-CM

## 2012-10-12 DIAGNOSIS — M549 Dorsalgia, unspecified: Secondary | ICD-10-CM | POA: Diagnosis present

## 2012-10-12 DIAGNOSIS — D509 Iron deficiency anemia, unspecified: Secondary | ICD-10-CM

## 2012-10-12 DIAGNOSIS — J449 Chronic obstructive pulmonary disease, unspecified: Secondary | ICD-10-CM | POA: Diagnosis present

## 2012-10-12 DIAGNOSIS — J984 Other disorders of lung: Secondary | ICD-10-CM | POA: Diagnosis present

## 2012-10-12 DIAGNOSIS — R894 Abnormal immunological findings in specimens from other organs, systems and tissues: Secondary | ICD-10-CM | POA: Diagnosis present

## 2012-10-12 DIAGNOSIS — J9610 Chronic respiratory failure, unspecified whether with hypoxia or hypercapnia: Secondary | ICD-10-CM | POA: Diagnosis present

## 2012-10-12 DIAGNOSIS — D649 Anemia, unspecified: Secondary | ICD-10-CM | POA: Diagnosis present

## 2012-10-12 DIAGNOSIS — I482 Chronic atrial fibrillation, unspecified: Secondary | ICD-10-CM | POA: Diagnosis present

## 2012-10-12 DIAGNOSIS — I4891 Unspecified atrial fibrillation: Secondary | ICD-10-CM | POA: Diagnosis present

## 2012-10-12 DIAGNOSIS — E669 Obesity, unspecified: Secondary | ICD-10-CM

## 2012-10-12 DIAGNOSIS — K219 Gastro-esophageal reflux disease without esophagitis: Secondary | ICD-10-CM | POA: Diagnosis present

## 2012-10-12 DIAGNOSIS — I10 Essential (primary) hypertension: Secondary | ICD-10-CM | POA: Diagnosis present

## 2012-10-12 DIAGNOSIS — K810 Acute cholecystitis: Secondary | ICD-10-CM

## 2012-10-12 DIAGNOSIS — Z87891 Personal history of nicotine dependence: Secondary | ICD-10-CM

## 2012-10-12 DIAGNOSIS — M069 Rheumatoid arthritis, unspecified: Secondary | ICD-10-CM | POA: Diagnosis present

## 2012-10-12 DIAGNOSIS — E782 Mixed hyperlipidemia: Secondary | ICD-10-CM | POA: Diagnosis present

## 2012-10-12 DIAGNOSIS — G8929 Other chronic pain: Secondary | ICD-10-CM | POA: Diagnosis present

## 2012-10-12 DIAGNOSIS — R760 Raised antibody titer: Secondary | ICD-10-CM | POA: Diagnosis present

## 2012-10-12 LAB — CBC WITH DIFFERENTIAL/PLATELET
Basophils Relative: 0 % (ref 0–1)
Eosinophils Absolute: 0.2 10*3/uL (ref 0.0–0.7)
Lymphs Abs: 1.4 10*3/uL (ref 0.7–4.0)
MCH: 28.7 pg (ref 26.0–34.0)
MCHC: 31.8 g/dL (ref 30.0–36.0)
Neutro Abs: 13.2 10*3/uL — ABNORMAL HIGH (ref 1.7–7.7)
Neutrophils Relative %: 80 % — ABNORMAL HIGH (ref 43–77)
Platelets: 192 10*3/uL (ref 150–400)
RBC: 3.73 MIL/uL — ABNORMAL LOW (ref 4.22–5.81)

## 2012-10-12 LAB — BASIC METABOLIC PANEL
Chloride: 99 mEq/L (ref 96–112)
GFR calc Af Amer: 65 mL/min — ABNORMAL LOW (ref >90)
GFR calc non Af Amer: 56 mL/min — ABNORMAL LOW (ref >90)
Potassium: 4 mEq/L (ref 3.5–5.1)
Sodium: 134 mEq/L — ABNORMAL LOW (ref 135–145)

## 2012-10-12 LAB — MRSA PCR SCREENING: MRSA by PCR: POSITIVE — AB

## 2012-10-12 MED ORDER — DILTIAZEM HCL 100 MG IV SOLR
5.0000 mg/h | INTRAVENOUS | Status: DC
  Administered 2012-10-12: 5 mg/h via INTRAVENOUS
  Filled 2012-10-12: qty 100

## 2012-10-12 MED ORDER — ALBUTEROL SULFATE (5 MG/ML) 0.5% IN NEBU
2.5000 mg | INHALATION_SOLUTION | RESPIRATORY_TRACT | Status: DC | PRN
  Administered 2012-10-13: 2.5 mg via RESPIRATORY_TRACT
  Filled 2012-10-12: qty 0.5

## 2012-10-12 MED ORDER — OXYCODONE HCL 5 MG PO TABS
5.0000 mg | ORAL_TABLET | Freq: Three times a day (TID) | ORAL | Status: DC | PRN
  Administered 2012-10-12 – 2012-10-14 (×5): 5 mg via ORAL
  Filled 2012-10-12 (×5): qty 1

## 2012-10-12 MED ORDER — FOLIC ACID 1 MG PO TABS
1.0000 mg | ORAL_TABLET | Freq: Every day | ORAL | Status: DC
  Administered 2012-10-12 – 2012-10-14 (×3): 1 mg via ORAL
  Filled 2012-10-12 (×3): qty 1

## 2012-10-12 MED ORDER — WARFARIN - PHARMACIST DOSING INPATIENT: Freq: Every day | Status: DC

## 2012-10-12 MED ORDER — DEXTROSE 5 % IV SOLN
500.0000 mg | INTRAVENOUS | Status: DC
  Administered 2012-10-12: 500 mg via INTRAVENOUS
  Filled 2012-10-12 (×3): qty 500

## 2012-10-12 MED ORDER — OXYCODONE-ACETAMINOPHEN 5-325 MG PO TABS
1.0000 | ORAL_TABLET | Freq: Three times a day (TID) | ORAL | Status: DC | PRN
  Administered 2012-10-12 – 2012-10-14 (×5): 1 via ORAL
  Filled 2012-10-12 (×5): qty 1

## 2012-10-12 MED ORDER — AZITHROMYCIN 500 MG IV SOLR: 500.0000 mg | Freq: Once | INTRAVENOUS | Status: DC

## 2012-10-12 MED ORDER — FUROSEMIDE 40 MG PO TABS
40.0000 mg | ORAL_TABLET | Freq: Two times a day (BID) | ORAL | Status: DC
  Administered 2012-10-12 – 2012-10-13 (×2): 40 mg via ORAL
  Filled 2012-10-12 (×2): qty 1

## 2012-10-12 MED ORDER — WARFARIN SODIUM 2 MG PO TABS: 2.0000 mg | ORAL_TABLET | Freq: Every day | ORAL | Status: DC

## 2012-10-12 MED ORDER — MUPIROCIN 2 % EX OINT
1.0000 "application " | TOPICAL_OINTMENT | Freq: Two times a day (BID) | CUTANEOUS | Status: DC
  Administered 2012-10-12 – 2012-10-14 (×4): 1 via NASAL
  Filled 2012-10-12 (×2): qty 22

## 2012-10-12 MED ORDER — OXYCODONE-ACETAMINOPHEN 10-325 MG PO TABS: 1.0000 | ORAL_TABLET | Freq: Three times a day (TID) | ORAL | Status: DC | PRN

## 2012-10-12 MED ORDER — OMEGA-3-ACID ETHYL ESTERS 1 G PO CAPS
1.0000 g | ORAL_CAPSULE | Freq: Every day | ORAL | Status: DC
  Administered 2012-10-12: 1 g via ORAL

## 2012-10-12 MED ORDER — IPRATROPIUM BROMIDE 0.02 % IN SOLN
0.5000 mg | Freq: Once | RESPIRATORY_TRACT | Status: AC
  Administered 2012-10-12: 0.5 mg via RESPIRATORY_TRACT
  Filled 2012-10-12: qty 2.5

## 2012-10-12 MED ORDER — DEXTROSE 5 % IV SOLN
1.0000 g | Freq: Once | INTRAVENOUS | Status: AC
  Administered 2012-10-12: 1 g via INTRAVENOUS
  Filled 2012-10-12: qty 10

## 2012-10-12 MED ORDER — PREDNISONE 20 MG PO TABS
60.0000 mg | ORAL_TABLET | Freq: Once | ORAL | Status: AC
  Administered 2012-10-12: 60 mg via ORAL
  Filled 2012-10-12: qty 3

## 2012-10-12 MED ORDER — CHLORHEXIDINE GLUCONATE CLOTH 2 % EX PADS
6.0000 | MEDICATED_PAD | Freq: Every day | CUTANEOUS | Status: DC
  Administered 2012-10-13 – 2012-10-14 (×2): 6 via TOPICAL

## 2012-10-12 MED ORDER — GABAPENTIN 300 MG PO CAPS
300.0000 mg | ORAL_CAPSULE | Freq: Three times a day (TID) | ORAL | Status: DC
  Administered 2012-10-12 – 2012-10-14 (×6): 300 mg via ORAL
  Filled 2012-10-12 (×6): qty 1

## 2012-10-12 MED ORDER — DILTIAZEM HCL ER COATED BEADS 180 MG PO CP24
180.0000 mg | ORAL_CAPSULE | Freq: Every evening | ORAL | Status: DC
  Administered 2012-10-12 – 2012-10-13 (×2): 180 mg via ORAL
  Filled 2012-10-12 (×2): qty 1

## 2012-10-12 MED ORDER — PANTOPRAZOLE SODIUM 40 MG PO TBEC
40.0000 mg | DELAYED_RELEASE_TABLET | Freq: Every day | ORAL | Status: DC
  Administered 2012-10-12 – 2012-10-14 (×3): 40 mg via ORAL
  Filled 2012-10-12 (×3): qty 1

## 2012-10-12 MED ORDER — WARFARIN SODIUM 7.5 MG PO TABS
7.5000 mg | ORAL_TABLET | Freq: Once | ORAL | Status: AC
  Administered 2012-10-12: 7.5 mg via ORAL
  Filled 2012-10-12: qty 1

## 2012-10-12 MED ORDER — HYDROMORPHONE HCL PF 1 MG/ML IJ SOLN
0.5000 mg | INTRAMUSCULAR | Status: DC | PRN
  Administered 2012-10-13: 0.5 mg via INTRAVENOUS
  Filled 2012-10-12: qty 1

## 2012-10-12 MED ORDER — OMEGA-3-ACID ETHYL ESTERS 1 G PO CAPS
1.0000 g | ORAL_CAPSULE | Freq: Every day | ORAL | Status: DC
  Administered 2012-10-13 – 2012-10-14 (×2): 1 g via ORAL
  Filled 2012-10-12 (×3): qty 1

## 2012-10-12 MED ORDER — ALBUTEROL SULFATE (5 MG/ML) 0.5% IN NEBU
5.0000 mg | INHALATION_SOLUTION | Freq: Once | RESPIRATORY_TRACT | Status: AC
  Administered 2012-10-12: 5 mg via RESPIRATORY_TRACT
  Filled 2012-10-12: qty 1

## 2012-10-12 MED ORDER — ACETAMINOPHEN 500 MG PO TABS
1000.0000 mg | ORAL_TABLET | Freq: Once | ORAL | Status: AC
  Administered 2012-10-12: 1000 mg via ORAL
  Filled 2012-10-12: qty 2

## 2012-10-12 MED ORDER — CLONIDINE HCL 0.1 MG PO TABS: 0.1000 mg | ORAL_TABLET | Freq: Two times a day (BID) | ORAL | Status: DC

## 2012-10-12 MED ORDER — POTASSIUM CHLORIDE CRYS ER 20 MEQ PO TBCR
20.0000 meq | EXTENDED_RELEASE_TABLET | Freq: Two times a day (BID) | ORAL | Status: DC
  Administered 2012-10-12 (×2): 20 meq via ORAL
  Filled 2012-10-12 (×2): qty 1

## 2012-10-12 MED ORDER — SODIUM CHLORIDE 0.9 % IV BOLUS (SEPSIS)
500.0000 mL | Freq: Once | INTRAVENOUS | Status: AC
  Administered 2012-10-12: 500 mL via INTRAVENOUS

## 2012-10-12 MED ORDER — DEXTROSE 5 % IV SOLN
1.0000 g | INTRAVENOUS | Status: DC
  Administered 2012-10-13: 1 g via INTRAVENOUS
  Filled 2012-10-12 (×3): qty 10

## 2012-10-12 MED ORDER — SODIUM CHLORIDE 0.9 % IV SOLN
INTRAVENOUS | Status: DC
  Administered 2012-10-12 – 2012-10-13 (×2): via INTRAVENOUS

## 2012-10-12 NOTE — ED Provider Notes (Signed)
History    This chart was scribed for Joya Gaskins, MD, MD by Smitty Pluck. The patient was seen in room APA03 and the patient's care was started at 11:45AM.   CSN: [id number]  Arrival date & time 10/12/12  1056      Chief Complaint  Patient presents with  . Chest Pain     Patient is a 78 y.o. male presenting with cough. The history is provided by the patient. No language interpreter was used.  Cough This is a new problem. The current episode started yesterday. The problem occurs constantly. The problem has not changed since onset.The cough is non-productive. The maximum temperature recorded prior to his arrival was 102 to 102.9 F. The fever has been present for less than 1 day. Associated symptoms include chest pain and shortness of breath. He has tried nothing for the symptoms.   [name] is a 78 y.o. male with hx of COPD who presents to the Emergency Department complaining of constant moderate nonproductive cough onset 1 day ago. Pt reports having right lateral chest pain that is aggravated by coughing. He states he has SOB. He denies nausea, diaphoresis and vomiting. Pt currently has fever of 102.5. Denies sick contact. Pt takes coumadin due to blood clot. Pt uses O2 at home at night.   PCP is Dr.Nyland   Past Medical History  Diagnosis Date  . COPD (chronic obstructive pulmonary disease)     Uses occasional nighttime O2  . Disseminated herpes zoster 2010  . GERD (gastroesophageal reflux disease)   . Asthma   . Pulmonary embolus     2003 and 2011  . Pulmonary nodules      Chest CT 09/11  . Mixed hyperlipidemia   . Atrial fibrillation     CHADS2 score 2  . Lupus anticoagulant positive   . Hypertension   . Gall stones   . Rheumatoid arthritis   . History of chicken pox 1941; 2011  . Anemia   . Chronic lower back pain   . Edema     Evaluated by Dr. Diona Browner - felt more likely component of cor pulmonale due to chronic lung disease (EF  60-65%, grade 2  diastolic dysfunction by echo 01/2012)    Past Surgical History  Procedure Date  . Polypectomy 02/02/2012    Procedure: POLYPECTOMY;  Surgeon: Corbin Ade, MD;  Location: [facility];  Service: Endoscopy;;  . Posterior lumbar vertebrae excision 2003; 2009; 2011  . Myringotomy     "3 times; both ears"  . Cataract extraction w/ intraocular lens  implant, bilateral   . Cholecystectomy 05/23/2012    Procedure: LAPAROSCOPIC CHOLECYSTECTOMY WITH INTRAOPERATIVE CHOLANGIOGRAM;  Surgeon: Almond Lint, MD;  Location: [facility];  Service: General;  Laterality: N/A;    Family History  Problem Relation Age of Onset  . Colon cancer Neg Hx   . Liver disease Neg Hx     History  Substance Use Topics  . Smoking status: Former Smoker -- 1.0 packs/day for 57 years    Types: Cigarettes    Quit date: 01/27/1998  . Smokeless tobacco: Never Used  . Alcohol Use: No     "quit drinking 1967"      Review of Systems  Constitutional: Positive for fever.  Respiratory: Positive for cough and shortness of breath.   Cardiovascular: Positive for chest pain.  Gastrointestinal: Negative for nausea and vomiting.  All other systems reviewed and are negative.    Allergies  Cymbalta and Procaine hcl  Home Medications   Current Outpatient Rx  Name Route Sig Dispense Refill  . ALBUTEROL SULFATE HFA 108 (90 BASE) MCG/ACT IN AERS Inhalation Inhale 2 puffs into the lungs every 6 (six) hours as needed. Shortness of Breath    . ALBUTEROL SULFATE (2.5 MG/3ML) 0.083% IN NEBU Nebulization Take 2.5 mg by nebulization every 6 (six) hours as needed. Shortness of Breath    . CETIRIZINE HCL 10 MG PO TABS Oral Take 10 mg by mouth daily.      Marland Kitchen CLONIDINE HCL 0.1 MG PO TABS Oral Take 0.1 mg by mouth 2 (two) times daily.      Marland Kitchen DILTIAZEM HCL ER COATED BEADS 180 MG PO CP24  TAKE ONE CAPSULE EVERY DAY (NEEDS OFFICE VISIT) 15 capsule 0    ZSJ  . ENOXAPARIN SODIUM 150 MG/ML [place] SOLN Subcutaneous Inject 1 mL (150 mg total) into the skin  daily. 5 Syringe 0  . ETANERCEPT 25 MG/0.5ML [place] SOLN Subcutaneous Inject 25 mg into the skin 2 (two) times a week.     Marland Kitchen FOLIC ACID 1 MG PO TABS Oral Take 1 mg by mouth daily.     . FUROSEMIDE 40 MG PO TABS Oral Take 40 mg by mouth 2 (two) times daily.     Marland Kitchen GABAPENTIN 300 MG PO CAPS Oral Take 300 mg by mouth 3 (three) times daily.     Marland Kitchen METHOTREXATE 2.5 MG PO TABS Oral Take 17.5 mg by mouth once a week. Takes 7 tablets on Friday    . FISH OIL 1000 MG PO CAPS Oral Take 1 capsule by mouth 2 (two) times daily.     Marland Kitchen OMEPRAZOLE 20 MG PO CPDR Oral Take 20 mg by mouth 2 (two) times daily.     . OXYCODONE-ACETAMINOPHEN 10-325 MG PO TABS Oral Take 1 tablet by mouth every 8 (eight) hours as needed for pain. 30 tablet 0  . POTASSIUM CHLORIDE CRYS ER 20 MEQ PO TBCR Oral Take 20 mEq by mouth 2 (two) times daily.      . WARFARIN SODIUM 2 MG PO TABS Oral Take 2 mg by mouth daily.      BP 108/51  Pulse 93  Temp 102.5 F (39.2 C) (Oral)  Ht 6\' 1"  (1.854 m)  Wt 210 lb (95.255 kg)  BMI 27.71 kg/m2  SpO2 90% BP 103/67  Pulse 130  Temp 102.5 F (39.2 C) (Oral)  Resp 19  Ht 6\' 1"  (1.854 m)  Wt 210 lb (95.255 kg)  BMI 27.71 kg/m2  SpO2 91%   Physical Exam  Nursing note and vitals reviewed. CONSTITUTIONAL: Well developed/well nourished HEAD AND FACE: Normocephalic/atraumatic EYES: EOMI/PERRL ENMT: Mucous membranes moist NECK: supple no meningeal signs SPINE:entire spine nontender CV: S1/S2 noted, no murmurs/rubs/gallops noted LUNGS:  scattered wheezes bilaterally, crackles in right base, chest wall tenderness to palpation on right side  ABDOMEN: soft, nontender, no rebound or guarding GU:no cva tenderness NEURO: Pt is awake/alert, moves all extremitiesx4, no distress, appropriate EXTREMITIES: pulses normal, full ROM SKIN: warm, color normal PSYCH: no abnormalities of mood noted   ED Course  Procedures  CRITICAL CARE Performed by: Joya Gaskins   Total critical care time:  40  Critical care time was exclusive of separately billable procedures and treating other patients.  Critical care was necessary to treat or prevent imminent or life-threatening deterioration.  Critical care was time spent personally by me on the following activities: development of treatment plan with patient and/or surrogate as well as nursing,  discussions with consultants, evaluation of patient's response to treatment, examination of patient, obtaining history from patient or surrogate, ordering and performing treatments and interventions, ordering and review of laboratory studies, ordering and review of radiographic studies, pulse oximetry and re-evaluation of patient's condition.   DIAGNOSTIC STUDIES: Oxygen Saturation is 90% on room air, low by my interpretation.    COORDINATION OF CARE: 11:49 AM Discussed ED treatment with pt  11:49 AM Ordered:     . acetaminophen  1,000 mg Oral Once  . albuterol  5 mg Nebulization Once  . ipratropium  0.5 mg Nebulization Once  . predniSONE  60 mg Oral Once   Pt with cough/fever/abnormal Xray, likely pneumonia Will need admission No recent antibiotics He uses oxygen at home at night.  Will apply Oxygen here  2:11 PM Pt having runs of afib with rvr (h/o afib) However, his resp status has improved.  He is speaking complete sentences.  His oxygen corrected with nasal cannula.   Pt stabilized in the ED.  Will admit patient to stepdown floor after d/w triad dr Karilyn Cota.  IV antibiotics ordered.        MDM  Nursing notes including past medical history and social history reviewed and considered in documentation xrays reviewed and considered Labs/vital reviewed and considered Previous records reviewed and considered - no recent hospitalizations noted     Date: 10/12/2012  Rate: 108  Rhythm: indeterminate  QRS Axis: right  Intervals: normal  ST/T Wave abnormalities: nonspecific ST changes  Conduction Disutrbances:none  Narrative  Interpretation:   Old EKG Reviewed: changes noted and ? afib on this EKG and rate is faster     I personally performed the services described in this documentation, which was scribed in my presence. The recorded information has been reviewed and considered.      Joya Gaskins, MD 10/12/12 [phone]

## 2012-10-12 NOTE — Progress Notes (Signed)
ANTICOAGULATION CONSULT NOTE - Initial Consult  Pharmacy Consult for Coumadin Indication: atrial fibrillation  Allergies  Allergen Reactions  . Cymbalta (Duloxetine Hcl) Other (See Comments)    Confusion   . Procaine Hcl Hives and Other (See Comments)    NOVOCAINE: Sweating, Confusion, Not in right state of mind.    Patient Measurements: Height: 6\' 1"  (185.4 cm) Weight: 210 lb (95.255 kg) IBW/kg (Calculated) : 79.9   Vital Signs: Temp: 102.5 F (39.2 C) (10/18 1118) Temp src: Oral (10/18 1118) BP: 111/58 mmHg (10/18 1445) Pulse Rate: 127  (10/18 1445)  Labs:  Basename 10/12/12 1150 10/12/12 1140  HGB 10.7* --  HCT 33.7* --  PLT 192 --  APTT -- --  LABPROT 15.8* --  INR 1.29 --  HEPARINUNFRC -- --  CREATININE -- 1.20  CKTOTAL -- --  CKMB -- --  TROPONINI -- --    Estimated Creatinine Clearance: 56.4 ml/min (by C-G formula based on Cr of 1.2).   Medical History: Past Medical History  Diagnosis Date  . COPD (chronic obstructive pulmonary disease)     Uses occasional nighttime O2  . Disseminated herpes zoster 2010  . GERD (gastroesophageal reflux disease)   . Asthma   . Pulmonary embolus     2003 and 2011  . Pulmonary nodules      Chest CT 09/11  . Mixed hyperlipidemia   . Atrial fibrillation     CHADS2 score 2  . Lupus anticoagulant positive   . Hypertension   . Gall stones   . Rheumatoid arthritis   . History of chicken pox 1941; 2011  . Anemia   . Chronic lower back pain   . Edema     Evaluated by Dr. Diona Browner - felt more likely component of cor pulmonale due to chronic lung disease (EF  60-65%, grade 2 diastolic dysfunction by echo 01/2012)    Medications:  Scheduled:    . acetaminophen  1,000 mg Oral Once  . albuterol  5 mg Nebulization Once  . cloNIDine  0.1 mg Oral BID  . diltiazem  180 mg Oral Daily  . folic acid  1 mg Oral Daily  . furosemide  40 mg Oral BID  . gabapentin  300 mg Oral TID  . ipratropium  0.5 mg Nebulization Once  .  omega-3 acid ethyl esters  1 g Oral Daily  . pantoprazole  40 mg Oral Daily  . potassium chloride SA  20 mEq Oral BID  . predniSONE  60 mg Oral Once  . warfarin  2 mg Oral Daily    Assessment: 78 yo M on chronic warfarin 5mg  daily except 7mg  on Wednesdays for Afib.  INR sub-therapeutic on admission. No bleeding noted.   Goal of Therapy:  INR 2-3   Plan:  1) Coumadin 7.5mg  po x 1 today 2) Daily INR  Leverett Camplin, [facility] 10/12/2012,4:25 PM

## 2012-10-12 NOTE — ED Notes (Signed)
Pt's daughter [name] left with pt wife, phone numbers:  Cell: [phone] Home: [phone]

## 2012-10-12 NOTE — H&P (Signed)
[facility] History and Physical  [name] ZOX:[id number] DOB: [birth year] DOA: 10/12/2012  Referring physician: Dr. Bebe Shaggy, ER physician. PCP: Josue Hector, MD    Chief Complaint: Right-sided chest pain.  HPI: [name] is a 78 y.o. male presents with the above symptom for the last 24 hours or so. The pain is sharp in nature. It is not typically pleuritic. He has a cough which is nonproductive, slightly different to his usual cough that he normally gets. There is no hemoptysis. He has been feeling feverish. There is no increasing dyspnea. He normally has oxygen 2 L per minute at night for his COPD. He is an ex smoker.   Review of Systems: Apart from history of present illness other systems negative.  Past Medical History  Diagnosis Date  . COPD (chronic obstructive pulmonary disease)     Uses occasional nighttime O2  . Disseminated herpes zoster 2010  . GERD (gastroesophageal reflux disease)   . Asthma   . Pulmonary embolus     2003 and 2011  . Pulmonary nodules      Chest CT 09/11  . Mixed hyperlipidemia   . Atrial fibrillation     CHADS2 score 2  . Lupus anticoagulant positive   . Hypertension   . Gall stones   . Rheumatoid arthritis   . History of chicken pox 1941; 2011  . Anemia   . Chronic lower back pain   . Edema     Evaluated by Dr. Diona Browner - felt more likely component of cor pulmonale due to chronic lung disease (EF  60-65%, grade 2 diastolic dysfunction by echo 01/2012)   Past Surgical History  Procedure Date  . Polypectomy 02/02/2012    Procedure: POLYPECTOMY;  Surgeon: Corbin Ade, MD;  Location: [facility];  Service: Endoscopy;;  . Posterior lumbar vertebrae excision 2003; 2009; 2011  . Myringotomy     "3 times; both ears"  . Cataract extraction w/ intraocular lens  implant, bilateral   . Cholecystectomy 05/23/2012    Procedure: LAPAROSCOPIC CHOLECYSTECTOMY WITH INTRAOPERATIVE CHOLANGIOGRAM;  Surgeon: Almond Lint, MD;  Location:  [facility] OR;  Service: General;  Laterality: N/A;   Social History:  reports that he quit smoking about 14 years ago. His smoking use included Cigarettes. He has a 57 pack-year smoking history. He has never used smokeless tobacco. He reports that he does not drink alcohol or use illicit drugs. He is married and lives with his wife. Nonsmoker as mentioned above. Does not drink alcohol.  Allergies  Allergen Reactions  . Cymbalta (Duloxetine Hcl) Other (See Comments)    Confusion   . Procaine Hcl Hives and Other (See Comments)    Sweating, Confusion, Not in right state of mind.    Family History  Problem Relation Age of Onset  . Colon cancer Neg Hx   . Liver disease Neg Hx       Prior to Admission medications   Medication Sig Start Date End Date Taking? Authorizing Provider  albuterol (PROAIR HFA) 108 (90 BASE) MCG/ACT inhaler Inhale 2 puffs into the lungs every 6 (six) hours as needed. Shortness of Breath   Yes Historical Provider, MD  albuterol (PROVENTIL) (2.5 MG/3ML) 0.083% nebulizer solution Take 2.5 mg by nebulization every 6 (six) hours as needed. Shortness of Breath   Yes Historical Provider, MD  cetirizine (ZYRTEC) 10 MG tablet Take 10 mg by mouth daily.     Yes Historical Provider, MD  cloNIDine (CATAPRES) 0.1 MG tablet Take 0.1 mg  by mouth 2 (two) times daily.     Yes Historical Provider, MD  enoxaparin (LOVENOX) 150 MG/ML injection Inject 1 mL (150 mg total) into the skin daily. 05/25/12  Yes Clydia Llano, MD  diltiazem (CARDIZEM CD) 180 MG 24 hr capsule TAKE ONE CAPSULE EVERY DAY (NEEDS OFFICE VISIT) 09/27/12   Jonelle Sidle, MD  Etanercept (ENBREL) 25 MG/0.5ML SOLN Inject 25 mg into the skin 2 (two) times a week.     Historical Provider, MD  folic acid (FOLVITE) 1 MG tablet Take 1 mg by mouth daily.     Historical Provider, MD  furosemide (LASIX) 40 MG tablet Take 40 mg by mouth 2 (two) times daily.     Historical Provider, MD  gabapentin (NEURONTIN) 300 MG capsule Take 300 mg by  mouth 3 (three) times daily.     Historical Provider, MD  methotrexate (RHEUMATREX) 2.5 MG tablet Take 17.5 mg by mouth once a week. Takes 7 tablets on Friday    Historical Provider, MD  Omega-3 Fatty Acids (FISH OIL) 1000 MG CAPS Take 1 capsule by mouth 2 (two) times daily.     Historical Provider, MD  omeprazole (PRILOSEC) 20 MG capsule Take 20 mg by mouth 2 (two) times daily.     Historical Provider, MD  oxyCODONE-acetaminophen (PERCOCET) 10-325 MG per tablet Take 1 tablet by mouth every 8 (eight) hours as needed for pain. 05/25/12   Clydia Llano, MD  potassium chloride SA (K-DUR,KLOR-CON) 20 MEQ tablet Take 20 mEq by mouth 2 (two) times daily.      Historical Provider, MD  warfarin (COUMADIN) 2 MG tablet Take 2 mg by mouth daily. 02/03/12   Corbin Ade, MD   Physical Exam: Filed Vitals:   10/12/12 1118 10/12/12 1212 10/12/12 1248 10/12/12 1327  BP: 108/51  100/57 103/67  Pulse: 93  80 130  Temp: 102.5 F (39.2 C)     TempSrc: Oral     Resp:   22 19  Height: 6\' 1"  (1.854 m)     Weight: 95.255 kg (210 lb)     SpO2: 90% 87% 89% 91%     General:  He looks systemically well. He does not actually look toxic or septic. He is febrile though.  Eyes: No pallor. No jaundice.  ENT: No abnormalities.  Neck: No neck lymphadenopathy.  Cardiovascular: Heart sounds are present and irregular, consistent with atrial fibrillation.  Respiratory: Lung fields are reduced on the right middle and lower zones. There is no bronchial breathing there is no crackles. There is no wheezing.  Abdomen: Soft, nontender, no pallor splenomegaly. No masses.  Skin: No rashes.  Musculoskeletal: Age-related osteoarthritis.  Psychiatric: Appropriate affect.  Neurologic: Alert and orientated without any focal neurological signs.  Labs on Admission:  Basic Metabolic Panel:  Lab 10/12/12 4540  NA 134*  K 4.0  CL 99  CO2 28  GLUCOSE 110*  BUN 18  CREATININE 1.20  CALCIUM 9.2  MG --  PHOS --   Liver  Function Tests:    CBC:  Lab 10/12/12 1150  WBC 16.6*  NEUTROABS 13.2*  HGB 10.7*  HCT 33.7*  MCV 90.3  PLT 192     Radiological Exams on Admission: Dg Chest Port 1 View  10/12/2012  *RADIOLOGY REPORT*  Clinical Data: Chest pain  PORTABLE CHEST - 1 VIEW  Comparison: 05/21/2012  Findings: There is consolidation in the periphery of the right lung. 9 mm nodular density within the left mid lung, not appreciated on  prior.  Above findings superimposed on coarse interstitial markings and lung base scarring.  Aortic arch atherosclerosis.  Heart size upper normal.  Small right pleural effusion not excluded.  No acute osseous finding.  IMPRESSION: Increased consolidation within the right lung periphery is suspicious for pneumonia. Recommend radiograph follow-up after therapy to document resolution.   Subcentimeter nodular opacity within the left lung is nonspecific, not appreciated previously.  Attention on follow-up.   Original Report Authenticated By: Waneta Martins, M.D.       Assessment/Plan   1. Right-sided community-acquired pneumonia. 2. Atrial fibrillation with rapid ventricular response, likely related to hypovolemia and fever. 3. Hypertension. 4. COPD, stable.  Code Status: Full code.  Family Communication: Discussed plan the patient at the bedside.   Disposition Plan: Home in medically stable.   Time spent: 40 minutes.  Wilson Singer [facility] Pager [phone].  If 7PM-7AM, please contact night-coverage [email or web address] Password [email or web address] 10/12/2012, 2:45 PM

## 2012-10-12 NOTE — ED Notes (Signed)
Pain rt lat chest , onset yesterday, no injury, alert, fever.  cough

## 2012-10-13 DIAGNOSIS — D649 Anemia, unspecified: Secondary | ICD-10-CM | POA: Diagnosis present

## 2012-10-13 LAB — CBC
HCT: 28.8 % — ABNORMAL LOW (ref 39.0–52.0)
Hemoglobin: 9.2 g/dL — ABNORMAL LOW (ref 13.0–17.0)
MCHC: 31.9 g/dL (ref 30.0–36.0)
MCV: 89.7 fL (ref 78.0–100.0)
RDW: 19.8 % — ABNORMAL HIGH (ref 11.5–15.5)

## 2012-10-13 LAB — COMPREHENSIVE METABOLIC PANEL
ALT: 13 U/L (ref 0–53)
AST: 14 U/L (ref 0–37)
Albumin: 2.6 g/dL — ABNORMAL LOW (ref 3.5–5.2)
Alkaline Phosphatase: 80 U/L (ref 39–117)
Glucose, Bld: 121 mg/dL — ABNORMAL HIGH (ref 70–99)
Potassium: 4.7 mEq/L (ref 3.5–5.1)
Sodium: 141 mEq/L (ref 135–145)
Total Protein: 6.9 g/dL (ref 6.0–8.3)

## 2012-10-13 LAB — HIV ANTIBODY (ROUTINE TESTING W REFLEX): HIV: NONREACTIVE

## 2012-10-13 LAB — STREP PNEUMONIAE URINARY ANTIGEN: Strep Pneumo Urinary Antigen: NEGATIVE

## 2012-10-13 MED ORDER — AZITHROMYCIN 250 MG PO TABS
250.0000 mg | ORAL_TABLET | Freq: Every day | ORAL | Status: DC
  Administered 2012-10-13 – 2012-10-14 (×2): 250 mg via ORAL
  Filled 2012-10-13 (×3): qty 1

## 2012-10-13 MED ORDER — WARFARIN SODIUM 7.5 MG PO TABS
7.5000 mg | ORAL_TABLET | Freq: Once | ORAL | Status: AC
  Administered 2012-10-13: 7.5 mg via ORAL
  Filled 2012-10-13: qty 1

## 2012-10-13 MED ORDER — SODIUM CHLORIDE 0.9 % IV SOLN
INTRAVENOUS | Status: DC
  Administered 2012-10-13: 15:00:00 via INTRAVENOUS

## 2012-10-13 NOTE — Progress Notes (Signed)
Chart reviewed.  Subjective: Feels much better today. Some dry hacking cough. Eating well. Chronic shortness of breath unchanged. Wears oxygen at night at home. Anxious to get home because of a death in the family. Chest pain better.  Objective: Vital signs in last 24 hours: Filed Vitals:   10/13/12 0630 10/13/12 0645 10/13/12 0700 10/13/12 0800  BP:   109/92 109/61  Pulse: 82 123 107 109  Temp:   97.8 F (36.6 C)   TempSrc:      Resp: 14 22 20 21   Height:      Weight:      SpO2: 96% 94% 97% 96%   Weight change:   Intake/Output Summary (Last 24 hours) at 10/13/12 0817 Last data filed at 10/13/12 0800  Gross per 24 hour  Intake   3280 ml  Output   1800 ml  Net   1480 ml   telemetry: atrial fibrillation rate about 100.  General: Comfortable. Breathing nonlabored. Watching TV. Lungs: Diminished throughout. Good air movement. No wheezes rhonchi or rales. Cardiovascular: Irregularly irregular without murmurs gallops rubs appreciated Abdomen soft nontender nondistended Extremities no clubbing cyanosis or edema  Lab Results: Basic Metabolic Panel:  Lab 10/13/12 1478 10/12/12 1140  NA 141 134*  K 4.7 4.0  CL 106 99  CO2 28 28  GLUCOSE 121* 110*  BUN 19 18  CREATININE 0.98 1.20  CALCIUM 9.0 9.2  MG -- --  PHOS -- --   Liver Function Tests:  Lab 10/13/12 0520  AST 14  ALT 13  ALKPHOS 80  BILITOT 0.5  PROT 6.9  ALBUMIN 2.6*   No results found for this basename: LIPASE:2,AMYLASE:2 in the last 168 hours No results found for this basename: AMMONIA:2 in the last 168 hours CBC:  Lab 10/13/12 0520 10/12/12 1150  WBC 11.8* 16.6*  NEUTROABS -- 13.2*  HGB 9.2* 10.7*  HCT 28.8* 33.7*  MCV 89.7 90.3  PLT 161 192   Cardiac Enzymes: No results found for this basename: CKTOTAL:3,CKMB:3,CKMBINDEX:3,TROPONINI:3 in the last 168 hours BNP: No results found for this basename: PROBNP:3 in the last 168 hours D-Dimer: No results found for this basename: DDIMER:2 in the  last 168 hours CBG: No results found for this basename: GLUCAP:6 in the last 168 hours Hemoglobin A1C: No results found for this basename: HGBA1C in the last 168 hours Fasting Lipid Panel: No results found for this basename: CHOL,HDL,LDLCALC,TRIG,CHOLHDL,LDLDIRECT in the last 295 hours Thyroid Function Tests: No results found for this basename: TSH,T4TOTAL,FREET4,T3FREE,THYROIDAB in the last 168 hours Coagulation:  Lab 10/13/12 0520 10/12/12 1150  LABPROT 18.6* 15.8*  INR 1.61* 1.29   Anemia Panel: No results found for this basename: VITAMINB12,FOLATE,FERRITIN,TIBC,IRON,RETICCTPCT in the last 168 hours  HIV nonreactive. Strep pneumonia urinary antigen negative. Urine Legionella pending.  Micro Results: Recent Results (from the past 240 hour(s))  CULTURE, BLOOD (ROUTINE X 2)     Status: Normal (Preliminary result)   Collection Time   10/12/12 11:50 AM      Component Value Range Status Comment   Specimen Description Blood   Final    Special Requests NONE   Final    Culture NO GROWTH <24 HRS   Final    Report Status PENDING   Incomplete   CULTURE, BLOOD (ROUTINE X 2)     Status: Normal (Preliminary result)   Collection Time   10/12/12 12:40 PM      Component Value Range Status Comment   Specimen Description Blood   Final  Special Requests NONE   Final    Culture NO GROWTH <24 HRS   Final    Report Status PENDING   Incomplete   MRSA PCR SCREENING     Status: Abnormal   Collection Time   10/12/12  4:33 PM      Component Value Range Status Comment   MRSA by PCR POSITIVE (*) NEGATIVE Final    Scheduled Meds:   . acetaminophen  1,000 mg Oral Once  . albuterol  5 mg Nebulization Once  . azithromycin  500 mg Intravenous Q24H  . cefTRIAXone (ROCEPHIN)  IV  1 g Intravenous Once  . cefTRIAXone (ROCEPHIN)  IV  1 g Intravenous Q24H  . Chlorhexidine Gluconate Cloth  6 each Topical Q0600  . cloNIDine  0.1 mg Oral BID  . diltiazem  180 mg Oral QPM  . folic acid  1 mg Oral Daily    . furosemide  40 mg Oral BID  . gabapentin  300 mg Oral TID  . ipratropium  0.5 mg Nebulization Once  . mupirocin ointment  1 application Nasal BID  . omega-3 acid ethyl esters  1 g Oral Daily  . pantoprazole  40 mg Oral Daily  . potassium chloride SA  20 mEq Oral BID  . predniSONE  60 mg Oral Once  . sodium chloride  500 mL Intravenous Once  . warfarin  7.5 mg Oral ONCE-1800  . Warfarin - Pharmacist Dosing Inpatient   Does not apply q1800  . DISCONTD: azithromycin  500 mg Intravenous Once  . DISCONTD: omega-3 acid ethyl esters  1 g Oral Daily  . DISCONTD: warfarin  2 mg Oral Daily   Continuous Infusions:   . sodium chloride 125 mL/hr at 10/13/12 0800  . DISCONTD: diltiazem (CARDIZEM) infusion 5 mg/hr (10/12/12 1700)   PRN Meds:.albuterol, HYDROmorphone (DILAUDID) injection, oxyCODONE, oxyCODONE-acetaminophen, DISCONTD: oxyCODONE-acetaminophen Assessment/Plan: Principal Problem:  *Community acquired pneumonia Active Problems:  Atrial fibrillation with RVR  Essential hypertension, benign  COPD (chronic obstructive pulmonary disease)  PULMONARY EMBOLISM  PULMONARY NODULE  Rheumatoid arthritis  Anticoagulant long-term use  Lupus anticoagulant positive  GERD (gastroesophageal reflux disease)  Cirrhosis  Continue Rocephin. Change azithromycin to by mouth. Hep-Lock IV. Hold Lasix for now in the setting of acute illness. Transfer to telemetry. Patient has been off the Cardizem drip. Continue home Cardizem dose. Hold clonidine in case her heart rate remains high, I can titrate Cardizem as needed. COPD stable. Followup Legionella results. Transfer to telemetry. Not yet ready for discharge.   LOS: 1 day   [name] L 10/13/2012, 8:17 AM

## 2012-10-13 NOTE — Progress Notes (Signed)
ANTICOAGULATION CONSULT NOTE  Pharmacy Consult for Coumadin Indication: atrial fibrillation  Allergies  Allergen Reactions  . Cymbalta (Duloxetine Hcl) Other (See Comments)    Confusion   . Procaine Hcl Hives and Other (See Comments)    NOVOCAINE: Sweating, Confusion, Not in right state of mind.    Patient Measurements: Height: 6\' 1"  (185.4 cm) Weight: 213 lb 6.5 oz (96.8 kg) IBW/kg (Calculated) : 79.9   Vital Signs: Temp: 97.8 F (36.6 C) (10/19 0700) Temp src: Oral (10/19 0533) BP: 109/61 mmHg (10/19 0800) Pulse Rate: 109  (10/19 0800)  Labs:  Basename 10/13/12 0520 10/12/12 1150 10/12/12 1140  HGB 9.2* 10.7* --  HCT 28.8* 33.7* --  PLT 161 192 --  APTT -- -- --  LABPROT 18.6* 15.8* --  INR 1.61* 1.29 --  HEPARINUNFRC -- -- --  CREATININE 0.98 -- 1.20  CKTOTAL -- -- --  CKMB -- -- --  TROPONINI -- -- --    Estimated Creatinine Clearance: 75 ml/min (by C-G formula based on Cr of 0.98).   Medical History: Past Medical History  Diagnosis Date  . COPD (chronic obstructive pulmonary disease)     Uses occasional nighttime O2  . Disseminated herpes zoster 2010  . GERD (gastroesophageal reflux disease)   . Asthma   . Pulmonary embolus     2003 and 2011  . Pulmonary nodules      Chest CT 09/11  . Mixed hyperlipidemia   . Atrial fibrillation     CHADS2 score 2  . Lupus anticoagulant positive   . Hypertension   . Gall stones   . Rheumatoid arthritis   . History of chicken pox 1941; 2011  . Anemia   . Chronic lower back pain   . Edema     Evaluated by Dr. Diona Browner - felt more likely component of cor pulmonale due to chronic lung disease (EF  60-65%, grade 2 diastolic dysfunction by echo 01/2012)    Medications:  Scheduled:     . acetaminophen  1,000 mg Oral Once  . albuterol  5 mg Nebulization Once  . azithromycin  500 mg Intravenous Q24H  . cefTRIAXone (ROCEPHIN)  IV  1 g Intravenous Once  . cefTRIAXone (ROCEPHIN)  IV  1 g Intravenous Q24H  .  Chlorhexidine Gluconate Cloth  6 each Topical Q0600  . cloNIDine  0.1 mg Oral BID  . diltiazem  180 mg Oral QPM  . folic acid  1 mg Oral Daily  . furosemide  40 mg Oral BID  . gabapentin  300 mg Oral TID  . ipratropium  0.5 mg Nebulization Once  . mupirocin ointment  1 application Nasal BID  . omega-3 acid ethyl esters  1 g Oral Daily  . pantoprazole  40 mg Oral Daily  . potassium chloride SA  20 mEq Oral BID  . predniSONE  60 mg Oral Once  . sodium chloride  500 mL Intravenous Once  . warfarin  7.5 mg Oral ONCE-1800  . Warfarin - Pharmacist Dosing Inpatient   Does not apply q1800  . DISCONTD: azithromycin  500 mg Intravenous Once  . DISCONTD: omega-3 acid ethyl esters  1 g Oral Daily  . DISCONTD: warfarin  2 mg Oral Daily    Assessment: 78 yo M on chronic warfarin 5mg  daily except 7mg  on Wednesdays for Afib.  INR sub-therapeutic on admission.   INR rising to goal today. No bleeding noted.   Goal of Therapy:  INR 2-3   Plan:  1)  Coumadin 7.5mg  po x 1 today 2) Daily INR  Yasmene Salomone, [facility] 10/13/2012,8:19 AM

## 2012-10-13 NOTE — Progress Notes (Signed)
PT TRANSFERRED VIA W/C TO ROOM 308. PT ALERT AND ORIENTED. LUNGS CLEAR BUT DIMINISHED. O2 SAT 98% ON ROOM AIR. NPC. REMAINS IN CONTROLED ATRIAL FIB. NSL PATENT. REPORT CALLED TO NADINE RN ON 300.

## 2012-10-14 DIAGNOSIS — I10 Essential (primary) hypertension: Secondary | ICD-10-CM

## 2012-10-14 LAB — LEGIONELLA ANTIGEN, URINE

## 2012-10-14 MED ORDER — DILTIAZEM HCL ER COATED BEADS 240 MG PO CP24: 240.0000 mg | ORAL_CAPSULE | Freq: Every day | ORAL | Status: DC

## 2012-10-14 MED ORDER — AZITHROMYCIN 250 MG PO TABS: 250.0000 mg | ORAL_TABLET | Freq: Every day | ORAL | Status: DC

## 2012-10-14 MED ORDER — WARFARIN SODIUM 7.5 MG PO TABS: 7.5000 mg | ORAL_TABLET | Freq: Once | ORAL | Status: DC

## 2012-10-14 MED ORDER — DILTIAZEM HCL 30 MG PO TABS
30.0000 mg | ORAL_TABLET | Freq: Once | ORAL | Status: AC
  Administered 2012-10-14: 30 mg via ORAL
  Filled 2012-10-14: qty 1

## 2012-10-14 MED ORDER — CEFUROXIME AXETIL 500 MG PO TABS: 500.0000 mg | ORAL_TABLET | Freq: Two times a day (BID) | ORAL | Status: DC

## 2012-10-14 NOTE — Progress Notes (Signed)
Patient given discharge instructions with no questions. Prescriptions given. Family at bedside. Patient given bactroban ointment to continue tx at home for remainder of 5 days. Left in personal vehicle with son and daughter. Taken out of facility by staff via wheelchair.

## 2012-10-14 NOTE — Progress Notes (Signed)
ANTICOAGULATION CONSULT NOTE  Pharmacy Consult for Coumadin Indication: atrial fibrillation  Allergies  Allergen Reactions  . Cymbalta (Duloxetine Hcl) Other (See Comments)    Confusion   . Procaine Hcl Hives and Other (See Comments)    NOVOCAINE: Sweating, Confusion, Not in right state of mind.    Patient Measurements: Height: 6\' 1"  (185.4 cm) Weight: 213 lb 6.5 oz (96.8 kg) IBW/kg (Calculated) : 79.9   Vital Signs: Temp: 97.7 F (36.5 C) (10/20 0500) Temp src: Oral (10/20 0500) BP: 115/64 mmHg (10/20 0500) Pulse Rate: 85  (10/20 0500)  Labs:  Basename 10/14/12 0750 10/13/12 0520 10/12/12 1150 10/12/12 1140  HGB -- 9.2* 10.7* --  HCT -- 28.8* 33.7* --  PLT -- 161 192 --  APTT -- -- -- --  LABPROT 20.9* 18.6* 15.8* --  INR 1.88* 1.61* 1.29 --  HEPARINUNFRC -- -- -- --  CREATININE -- 0.98 -- 1.20  CKTOTAL -- -- -- --  CKMB -- -- -- --  TROPONINI -- -- -- --    Estimated Creatinine Clearance: 75 ml/min (by C-G formula based on Cr of 0.98).   Medical History: Past Medical History  Diagnosis Date  . COPD (chronic obstructive pulmonary disease)     Uses occasional nighttime O2  . Disseminated herpes zoster 2010  . GERD (gastroesophageal reflux disease)   . Asthma   . Pulmonary embolus     2003 and 2011  . Pulmonary nodules      Chest CT 09/11  . Mixed hyperlipidemia   . Atrial fibrillation     CHADS2 score 2  . Lupus anticoagulant positive   . Hypertension   . Gall stones   . Rheumatoid arthritis   . History of chicken pox 1941; 2011  . Anemia   . Chronic lower back pain   . Edema     Evaluated by Dr. Diona Browner - felt more likely component of cor pulmonale due to chronic lung disease (EF  60-65%, grade 2 diastolic dysfunction by echo 01/2012)    Medications:  Scheduled:     . azithromycin  250 mg Oral Daily  . cefTRIAXone (ROCEPHIN)  IV  1 g Intravenous Q24H  . Chlorhexidine Gluconate Cloth  6 each Topical Q0600  . diltiazem  180 mg Oral QPM  .  folic acid  1 mg Oral Daily  . gabapentin  300 mg Oral TID  . mupirocin ointment  1 application Nasal BID  . omega-3 acid ethyl esters  1 g Oral Daily  . pantoprazole  40 mg Oral Daily  . warfarin  7.5 mg Oral ONCE-1800  . Warfarin - Pharmacist Dosing Inpatient   Does not apply q1800  . DISCONTD: azithromycin  500 mg Intravenous Once    Assessment: 78 yo M on chronic warfarin 5mg  daily except 7mg  on Wednesdays for Afib.  INR sub-therapeutic on admission.   INR rising to goal today. No bleeding noted.   Goal of Therapy:  INR 2-3   Plan:  1) Coumadin 7.5mg  po x 1 today 2) Daily INR  Messiah Ahr, [facility] 10/14/2012,10:31 AM

## 2012-10-14 NOTE — Discharge Summary (Signed)
Physician Discharge Summary  Patient ID: [name] MRN: [id number] DOB/AGE: 76-Nov-1932 76 y.o.  Admit date: 10/12/2012 Discharge date: 10/14/2012  Discharge Diagnoses:  Principal Problem:  *Community acquired pneumonia Active Problems:  Atrial fibrillation with RVR  Essential hypertension, benign  COPD (chronic obstructive pulmonary disease)  Anemia  PULMONARY EMBOLISM  PULMONARY NODULE  Rheumatoid arthritis  Anticoagulant long-term use  Lupus anticoagulant positive  GERD (gastroesophageal reflux disease)  Cirrhosis     Medication List     As of 10/14/2012  1:19 PM    TAKE these medications         albuterol (2.5 MG/3ML) 0.083% nebulizer solution   Commonly known as: PROVENTIL   Take 2.5 mg by nebulization every 6 (six) hours as needed. Shortness of Breath      PROAIR HFA 108 (90 BASE) MCG/ACT inhaler   Generic drug: albuterol   Inhale 2 puffs into the lungs every 6 (six) hours as needed. Shortness of Breath      azithromycin 250 MG tablet   Commonly known as: ZITHROMAX   Take 1 tablet (250 mg total) by mouth daily.      cefUROXime 500 MG tablet   Commonly known as: CEFTIN   Take 1 tablet (500 mg total) by mouth 2 (two) times daily.      cetirizine 10 MG tablet   Commonly known as: ZYRTEC   Take 10 mg by mouth daily.      cloNIDine 0.1 MG tablet   Commonly known as: CATAPRES   Take 0.1 mg by mouth 2 (two) times daily.      diltiazem 240 MG 24 hr capsule   Commonly known as: CARDIZEM CD   Take 1 capsule (240 mg total) by mouth at bedtime.      ENBREL 25 MG/0.5ML Soln   Generic drug: Etanercept   Inject 25 mg into the skin 2 (two) times a week.      Fish Oil 1000 MG Caps   Take 1 capsule by mouth 2 (two) times daily.      folic acid 1 MG tablet   Commonly known as: FOLVITE   Take 1 mg by mouth daily.      furosemide 40 MG tablet   Commonly known as: LASIX   Take 40 mg by mouth 2 (two) times daily.      gabapentin 300 MG capsule   Commonly  known as: NEURONTIN   Take 300 mg by mouth 3 (three) times daily.      methotrexate 2.5 MG tablet   Commonly known as: RHEUMATREX   Take 2.5 mg by mouth once a week. *TAKE 5 TABLETS BY MOUTH FOR A TOTAL 12.5MG  WEEKLY ON FRIDAYS*      omeprazole 20 MG capsule   Commonly known as: PRILOSEC   Take 20 mg by mouth 2 (two) times daily.      oxyCODONE-acetaminophen 10-325 MG per tablet   Commonly known as: PERCOCET   Take 1 tablet by mouth every 8 (eight) hours as needed for pain.      potassium chloride SA 20 MEQ tablet   Commonly known as: K-DUR,KLOR-CON   Take 20 mEq by mouth 2 (two) times daily.      warfarin 2 MG tablet   Commonly known as: COUMADIN   Take 5-7.5 mg by mouth every evening. Take two & one-half tablets (5mg  total) daily except take three and one-half tablet (7.5mg  total) on Wednesdays.  Discharge Orders    Future Appointments: Provider: Department: Dept Phone: Center:   10/23/2012 2:20 PM Jonelle Sidle, MD [facility] [phone] [id number]     Future Orders Please Complete By Expires   Diet - low sodium heart healthy      Increase activity slowly         Follow-up Information    Follow up with Nona Dell, MD. (as previously scheduled. )    Contact information:   [address] [place] [address] [phone]       Follow up with Josue Hector, MD. In 4 weeks. (for repeat chest xray)    Contact information:   [address] [facility] [address] [phone]       Follow up with coumadin clinic. In 1 week.         Disposition: 01-Home or Self Care  Discharged Condition: stable  Consults:  none  Labs:   Results for orders placed during the hospital encounter of 10/12/12 (from the past 48 hour(s))  MRSA PCR SCREENING     Status: Abnormal   Collection Time   10/12/12  4:33 PM      Component Value Range Comment   MRSA by PCR POSITIVE (*) NEGATIVE   STREP PNEUMONIAE URINARY ANTIGEN     Status:  Normal   Collection Time   10/12/12  6:47 PM      Component Value Range Comment   Strep Pneumo Urinary Antigen NEGATIVE  NEGATIVE   COMPREHENSIVE METABOLIC PANEL     Status: Abnormal   Collection Time   10/13/12  5:20 AM      Component Value Range Comment   Sodium 141  135 - 145 mEq/L    Potassium 4.7  3.5 - 5.1 mEq/L    Chloride 106  96 - 112 mEq/L    CO2 28  19 - 32 mEq/L    Glucose, Bld 121 (*) 70 - 99 mg/dL    BUN 19  6 - 23 mg/dL    Creatinine, Ser 6.64  0.50 - 1.35 mg/dL    Calcium 9.0  8.4 - 40.3 mg/dL    Total Protein 6.9  6.0 - 8.3 g/dL    Albumin 2.6 (*) 3.5 - 5.2 g/dL    AST 14  0 - 37 U/L    ALT 13  0 - 53 U/L    Alkaline Phosphatase 80  39 - 117 U/L    Total Bilirubin 0.5  0.3 - 1.2 mg/dL    GFR calc non Af Amer 76 (*) >90 mL/min    GFR calc Af Amer 88 (*) >90 mL/min   CBC     Status: Abnormal   Collection Time   10/13/12  5:20 AM      Component Value Range Comment   WBC 11.8 (*) 4.0 - 10.5 K/uL    RBC 3.21 (*) 4.22 - 5.81 MIL/uL    Hemoglobin 9.2 (*) 13.0 - 17.0 g/dL    HCT 47.4 (*) 25.9 - 52.0 %    MCV 89.7  78.0 - 100.0 fL    MCH 28.7  26.0 - 34.0 pg    MCHC 31.9  30.0 - 36.0 g/dL    RDW 56.3 (*) 87.5 - 15.5 %    Platelets 161  150 - 400 K/uL   PROTIME-INR     Status: Abnormal   Collection Time   10/13/12  5:20 AM      Component Value Range Comment   Prothrombin Time 18.6 (*)  11.6 - 15.2 seconds    INR 1.61 (*) 0.00 - 1.49   PROTIME-INR     Status: Abnormal   Collection Time   10/14/12  7:50 AM      Component Value Range Comment   Prothrombin Time 20.9 (*) 11.6 - 15.2 seconds    INR 1.88 (*) 0.00 - 1.49     Diagnostics:  Dg Chest Port 1 View  10/12/2012  *RADIOLOGY REPORT*  Clinical Data: Chest pain  PORTABLE CHEST - 1 VIEW  Comparison: 05/21/2012  Findings: There is consolidation in the periphery of the right lung. 9 mm nodular density within the left mid lung, not appreciated on prior.  Above findings superimposed on coarse interstitial markings  and lung base scarring.  Aortic arch atherosclerosis.  Heart size upper normal.  Small right pleural effusion not excluded.  No acute osseous finding.  IMPRESSION: Increased consolidation within the right lung periphery is suspicious for pneumonia. Recommend radiograph follow-up after therapy to document resolution.   Subcentimeter nodular opacity within the left lung is nonspecific, not appreciated previously.  Attention on follow-up.   Original Report Authenticated By: Waneta Martins, M.D.    EKG: Atrial fibrillation with premature ventricular or aberrantly conducted complexes Low voltage QRS Prolonged QT Abnormal ECG  Full Code   Hospital Course: See H&P for complete admission details. Mr. Stenseth is a 78 year old white male with chronic respiratory failure on home oxygen. He presented with pleuritic chest pain. He is a former smoker. In the emergency room, he was noted to be tachycardic into the 130s. Oxygen saturation was ranging 87-91 on supplemental oxygen. He had diminished breath sounds. His temperature was 102.5. He has chronic atrial fibrillation. Chest x-ray showed increased consolidation in the right lung periphery suspicious for pneumonia. Patient was started on a Cardizem drip, IV fluids, antibiotics. His Cardizem CD was increased. By the time of discharge, he was feeling much better. He was insisted upon discharge to 2 testing the family. He may need further adjustment of his Cardizem if he continues to have tachycardia. Total time on the day of discharge greater than 30 minutes.  Discharge Exam:  Blood pressure 115/64, pulse 85, temperature 97.7 F (36.5 C), temperature source Oral, resp. rate 20, height 6\' 1"  (1.854 m), weight 96.8 kg (213 lb 6.5 oz), SpO2 92.00%.  Unchanged from 10/13/2012  Signed: Crista Curb L 10/14/2012, 1:19 PM

## 2012-10-15 NOTE — Progress Notes (Signed)
UR Chart Review Completed  

## 2012-10-17 LAB — CULTURE, BLOOD (ROUTINE X 2)
Culture: NO GROWTH
Culture: NO GROWTH

## 2012-10-23 ENCOUNTER — Ambulatory Visit: Payer: Self-pay | Admitting: Cardiology

## 2012-11-02 ENCOUNTER — Encounter: Payer: Self-pay | Admitting: Adult Health

## 2012-11-02 ENCOUNTER — Ambulatory Visit (INDEPENDENT_AMBULATORY_CARE_PROVIDER_SITE_OTHER): Payer: Medicare PPO | Admitting: Adult Health

## 2012-11-02 VITALS — BP 111/60 | HR 57 | Ht 73.0 in | Wt 220.0 lb

## 2012-11-02 DIAGNOSIS — I10 Essential (primary) hypertension: Secondary | ICD-10-CM

## 2012-11-02 DIAGNOSIS — I4891 Unspecified atrial fibrillation: Secondary | ICD-10-CM

## 2012-11-02 DIAGNOSIS — J189 Pneumonia, unspecified organism: Secondary | ICD-10-CM

## 2012-11-02 DIAGNOSIS — D649 Anemia, unspecified: Secondary | ICD-10-CM

## 2012-11-02 MED ORDER — DILTIAZEM HCL ER COATED BEADS 240 MG PO CP24: 240.0000 mg | ORAL_CAPSULE | Freq: Every day | ORAL | Status: DC

## 2012-11-02 NOTE — Assessment & Plan Note (Signed)
His chest pain appears pleuritic in etiology, usually with deep breaths and coughing, located on the left. I have advised him to use anti-inflammatory medication Aleve BID for 5 days to assist in pain control. If pain persists will need to have another chest x-ray for further evaluation.

## 2012-11-02 NOTE — Assessment & Plan Note (Addendum)
Excellent control of his BP today. Increased dose of diltiazem has not caused significant hypotension.,He will continue clonidine as well. HR in the high 50's. This will need to be monitored closely. Refills on diltiazem are provided.

## 2012-11-02 NOTE — Patient Instructions (Addendum)
Your physician recommends that you schedule a follow-up appointment in: 6 months  Your physician has recommended you make the following change in your medication:  May take Aleve x 1 week for pain

## 2012-11-02 NOTE — Progress Notes (Signed)
HPI: [name] is a 78 y/o male patient of Dr. Diona Browner we are following for ongoing assessment and treatment of diastolic CHF, hypertension, and atrial fib. Coumadin in followed by PCP in [place].  He was recently admitted to [facility] with community acquired pneumonia treated with abx and  Inhalers for continued use with COPD. Atrial fibrillation was prominent during hospitalization and diltiazem was increased from 180 mg to 240 mg daily. He is complaining of left lateral chest discomfort with deep breathing or coughing.  He denies heaviness in his chest, palpitations or dizziness.   Allergies  Allergen Reactions  . Cymbalta (Duloxetine Hcl) Other (See Comments)    Confusion   . Procaine Hcl Hives and Other (See Comments)    NOVOCAINE: Sweating, Confusion, Not in right state of mind.    Current Outpatient Prescriptions  Medication Sig Dispense Refill  . albuterol (PROAIR HFA) 108 (90 BASE) MCG/ACT inhaler Inhale 2 puffs into the lungs every 6 (six) hours as needed. Shortness of Breath      . albuterol (PROVENTIL) (2.5 MG/3ML) 0.083% nebulizer solution Take 2.5 mg by nebulization every 6 (six) hours as needed. Shortness of Breath      . azithromycin (ZITHROMAX) 250 MG tablet Take 1 tablet (250 mg total) by mouth daily.  3 each  0  . cefUROXime (CEFTIN) 500 MG tablet Take 1 tablet (500 mg total) by mouth 2 (two) times daily.  10 tablet  0  . cetirizine (ZYRTEC) 10 MG tablet Take 10 mg by mouth daily.        . cloNIDine (CATAPRES) 0.1 MG tablet Take 0.1 mg by mouth 2 (two) times daily.        Marland Kitchen diltiazem (CARTIA XT) 240 MG 24 hr capsule Take 1 capsule (240 mg total) by mouth at bedtime.  30 capsule  0  . Etanercept (ENBREL) 25 MG/0.5ML SOLN Inject 25 mg into the skin 2 (two) times a week.       . folic acid (FOLVITE) 1 MG tablet Take 1 mg by mouth daily.       . furosemide (LASIX) 40 MG tablet Take 40 mg by mouth 2 (two) times daily.       Marland Kitchen gabapentin (NEURONTIN) 300 MG capsule Take 300 mg by  mouth 3 (three) times daily.       . methotrexate (RHEUMATREX) 2.5 MG tablet Take 2.5 mg by mouth once a week. *TAKE 5 TABLETS BY MOUTH FOR A TOTAL 12.5MG  WEEKLY ON FRIDAYS*      . Omega-3 Fatty Acids (FISH OIL) 1000 MG CAPS Take 1 capsule by mouth 2 (two) times daily.       Marland Kitchen omeprazole (PRILOSEC) 20 MG capsule Take 20 mg by mouth 2 (two) times daily.       Marland Kitchen oxyCODONE-acetaminophen (PERCOCET) 10-325 MG per tablet Take 1 tablet by mouth every 8 (eight) hours as needed for pain.  30 tablet  0  . potassium chloride SA (K-DUR,KLOR-CON) 20 MEQ tablet Take 20 mEq by mouth 2 (two) times daily.        Marland Kitchen warfarin (COUMADIN) 2 MG tablet Take 5-7.5 mg by mouth every evening. Take two & one-half tablets (5mg  total) daily except take three and one-half tablet (7.5mg  total) on Wednesdays.        Past Medical History  Diagnosis Date  . COPD (chronic obstructive pulmonary disease)     Uses occasional nighttime O2  . Disseminated herpes zoster 2010  . GERD (gastroesophageal reflux disease)   .  Asthma   . Pulmonary embolus     2003 and 2011  . Pulmonary nodules      Chest CT 09/11  . Mixed hyperlipidemia   . Atrial fibrillation     CHADS2 score 2  . Lupus anticoagulant positive   . Hypertension   . Gall stones   . Rheumatoid arthritis   . History of chicken pox 1941; 2011  . Anemia   . Chronic lower back pain   . Edema     Evaluated by Dr. Diona Browner - felt more likely component of cor pulmonale due to chronic lung disease (EF  60-65%, grade 2 diastolic dysfunction by echo 01/2012)    Past Surgical History  Procedure Date  . Polypectomy 02/02/2012    Procedure: POLYPECTOMY;  Surgeon: Corbin Ade, MD;  Location: [facility];  Service: Endoscopy;;  . Posterior lumbar vertebrae excision 2003; 2009; 2011  . Myringotomy     "3 times; both ears"  . Cataract extraction w/ intraocular lens  implant, bilateral   . Cholecystectomy 05/23/2012    Procedure: LAPAROSCOPIC CHOLECYSTECTOMY WITH INTRAOPERATIVE  CHOLANGIOGRAM;  Surgeon: Almond Lint, MD;  Location: [facility];  Service: General;  Laterality: N/A;    ZOX:WRUEAV of systems complete and found to be negative unless listed above PHYSICAL EXAM BP 111/60  Pulse 57  Ht 6\' 1"  (1.854 m)  Wt 220 lb (99.791 kg)  BMI 29.03 kg/m2  SpO2 92%  General: Well developed, well nourished, in no acute distress Head: Eyes PERRLA, No xanthomas.   Normal cephalic and atramatic  Lungs: Essentially CTA, without wheezes, but some discomfort with inspiration of the left side.  Heart: HIRR , with soft 1.6 systolic murmur.  Pulses are 2+ & equal.            No carotid bruit. No JVD.  No abdominal bruits. No femoral bruits. Abdomen: Bowel sounds are positive, abdomen soft and non-tender without masses or                  Hernia's noted. Msk:  Back normal, normal gait. Normal strength and tone for age. Extremities: No clubbing, cyanosis, mild non-pitting edema.  DP +1 Neuro: Alert and oriented X 3. Psych:  Good affect, responds appropriately EKG: Sinus bradycardia rate of 57 bpm. No evidence of atrial fib.  ASSESSMENT AND PLAN

## 2012-11-02 NOTE — Assessment & Plan Note (Signed)
Currently in NSR, rate of 57 bpm. Will continue coumadin for now.  He may have PAF, and will need coverage. He is followed by his PCP in [place] for INR and dosing.

## 2012-11-02 NOTE — Assessment & Plan Note (Signed)
Hgb down from 10.7 to 9.2 on Oct 13, 2012. He is to see his PCP with lab work already planned.  Hemoccult stools.

## 2013-01-03 ENCOUNTER — Ambulatory Visit (INDEPENDENT_AMBULATORY_CARE_PROVIDER_SITE_OTHER): Payer: Medicare Other | Admitting: Gastroenterology

## 2013-01-03 ENCOUNTER — Encounter: Payer: Self-pay | Admitting: Internal Medicine

## 2013-01-03 VITALS — BP 112/68 | HR 85 | Temp 97.9°F | Ht 73.0 in | Wt 226.4 lb

## 2013-01-03 DIAGNOSIS — K7460 Unspecified cirrhosis of liver: Secondary | ICD-10-CM

## 2013-01-03 DIAGNOSIS — D369 Benign neoplasm, unspecified site: Secondary | ICD-10-CM

## 2013-01-03 DIAGNOSIS — D509 Iron deficiency anemia, unspecified: Secondary | ICD-10-CM

## 2013-01-03 NOTE — Progress Notes (Signed)
Referring Provider: Joette Catching, MD Primary Care Physician:  Josue Hector, MD Primary Gastroenterologist: Dr. Jena Gauss   Chief Complaint  Patient presents with  . Rectal Bleeding    heme + stools    HPI:   79 year old male with hx of IDA, recurrent PE, afib, lupus anticoagulant positive on chronic Coumadin. Recently found to be heme positive at his PCP. Last seen by our office approximately a year ago; he underwent multiple scans due to an original abnormal finding on CT. Noted pancreas divisum, gallbladder stones and sludge. Pt has since had a cholecystectomy. During this work-up, he was found to have possible early cirrhosis. Hx of ETOH abuse. AFP Feb 2013 normal. His last colonoscopy was Feb 2013 with multiple flat, adenomatous polyps. He is overdue for a 6 month surveillance now.  Pt denies any overt GI bleeding. Notes "black" stools, but he is taking iron. No abdominal pain,no N/V. Had short episode of GI bug but better now. No bright red blood per rectum. Always tired, moreso than normal. No change in bowel habits. BM every 3-4 days. Omeprazole BID for GERD. No dysphagia.    Past Medical History  Diagnosis Date  . COPD (chronic obstructive pulmonary disease)     Uses occasional nighttime O2  . Disseminated herpes zoster 2010  . GERD (gastroesophageal reflux disease)   . Asthma   . Pulmonary embolus     2003 and 2011  . Pulmonary nodules      Chest CT 09/11  . Mixed hyperlipidemia   . Atrial fibrillation     CHADS2 score 2  . Lupus anticoagulant positive   . Hypertension   . Gall stones   . Rheumatoid arthritis   . History of chicken pox 1941; 2011  . Anemia   . Chronic lower back pain   . Edema     Evaluated by Dr. Diona Browner - felt more likely component of cor pulmonale due to chronic lung disease (EF  60-65%, grade 2 diastolic dysfunction by echo 01/2012)  . Cirrhosis     questionable, AFP normal Feb 01/2012, hx of ETOH use     Past Surgical History    Procedure Date  . Polypectomy 02/02/2012    RMR: Multiple colonic polyps removed, flat, tubular adenomas/ Left-sided diverticulosis  . Posterior lumbar vertebrae excision 2003; 2009; 2011  . Myringotomy     "3 times; both ears"  . Cataract extraction w/ intraocular lens  implant, bilateral   . Cholecystectomy 05/23/2012    Procedure: LAPAROSCOPIC CHOLECYSTECTOMY WITH INTRAOPERATIVE CHOLANGIOGRAM;  Surgeon: Almond Lint, MD;  Location: [facility] OR;  Service: General;  Laterality: N/A;  . Esophagogastroduodenoscopy 02/02/12    YQM:VHQIONGE size hiatal hernia; otherwise normal exam    Current Outpatient Prescriptions  Medication Sig Dispense Refill  . albuterol (PROAIR HFA) 108 (90 BASE) MCG/ACT inhaler Inhale 2 puffs into the lungs every 6 (six) hours as needed. Shortness of Breath      . albuterol (PROVENTIL) (2.5 MG/3ML) 0.083% nebulizer solution Take 2.5 mg by nebulization every 6 (six) hours as needed. Shortness of Breath      . cetirizine (ZYRTEC) 10 MG tablet Take 10 mg by mouth daily.        . cloNIDine (CATAPRES) 0.1 MG tablet Take 0.1 mg by mouth 2 (two) times daily.        Marland Kitchen diltiazem (CARTIA XT) 240 MG 24 hr capsule Take 1 capsule (240 mg total) by mouth at bedtime.  30 capsule  11  . Etanercept (ENBREL)  25 MG/0.5ML SOLN Inject 25 mg into the skin 2 (two) times a week.       . folic acid (FOLVITE) 1 MG tablet Take 1 mg by mouth daily.       . furosemide (LASIX) 40 MG tablet Take 40 mg by mouth 2 (two) times daily.       Marland Kitchen gabapentin (NEURONTIN) 300 MG capsule Take 300 mg by mouth 3 (three) times daily.       . methotrexate (RHEUMATREX) 2.5 MG tablet Take 2.5 mg by mouth once a week. *TAKE 5 TABLETS BY MOUTH FOR A TOTAL 12.5MG  WEEKLY ON FRIDAYS*      . Omega-3 Fatty Acids (FISH OIL) 1000 MG CAPS Take 1 capsule by mouth 2 (two) times daily.       Marland Kitchen omeprazole (PRILOSEC) 20 MG capsule Take 20 mg by mouth 2 (two) times daily.       Marland Kitchen oxyCODONE-acetaminophen (PERCOCET) 10-325 MG per tablet Take  1 tablet by mouth every 8 (eight) hours as needed for pain.  30 tablet  0  . potassium chloride SA (K-DUR,KLOR-CON) 20 MEQ tablet Take 20 mEq by mouth 2 (two) times daily.        Marland Kitchen warfarin (COUMADIN) 2 MG tablet Take 5-7.5 mg by mouth every evening. Take two & one-half tablets (5mg  total) daily except take three and one-half tablet (7.5mg  total) on Wednesdays.      Marland Kitchen azithromycin (ZITHROMAX) 250 MG tablet Take 1 tablet (250 mg total) by mouth daily.  3 each  0  . cefUROXime (CEFTIN) 500 MG tablet Take 1 tablet (500 mg total) by mouth 2 (two) times daily.  10 tablet  0    Allergies as of 01/03/2013 - Review Complete 01/03/2013  Allergen Reaction Noted  . Cymbalta (duloxetine hcl) Other (See Comments) 05/21/2012  . Procaine hcl Hives and Other (See Comments)     Family History  Problem Relation Age of Onset  . Colon cancer Neg Hx   . Liver disease Neg Hx     History   Social History  . Marital Status: Married    Spouse Name: N/A    Number of Children: 6  . Years of Education: N/A   Occupational History  . retired    Social History Main Topics  . Smoking status: Former Smoker -- 1.0 packs/day for 57 years    Types: Cigarettes    Quit date: 01/27/1998  . Smokeless tobacco: Never Used  . Alcohol Use: No     Comment: "quit drinking 1967"  . Drug Use: No  . Sexually Active: Not Currently   Other Topics Concern  . None   Social History Narrative  . None    Review of Systems: Gen: Denies fever, chills, anorexia. Denies fatigue, weakness, weight loss.  CV: occasional chest discomfort.  Resp: +DOE GI: SEE HPI Derm: Denies rash, itching, dry skin Psych: Denies depression, anxiety, memory loss, confusion. No homicidal or suicidal ideation.  Heme: Denies bruising, bleeding, and enlarged lymph nodes.  Physical Exam: BP 112/68  Pulse 85  Temp 97.9 F (36.6 C) (Oral)  Ht 6\' 1"  (1.854 m)  Wt 226 lb 6.4 oz (102.694 kg)  BMI 29.87 kg/m2 General:   Alert and oriented. No  distress noted. Pleasant and cooperative.  Head:  Normocephalic and atraumatic. Eyes:  Conjuctiva clear without scleral icterus. Mouth:  Oral mucosa pink and moist.  Neck:  Supple, without mass or thyromegaly. Heart:  S1, S2 present without murmurs, rubs, or gallops.  Regular rate and rhythm. Abdomen:  +BS, soft, large AP diameter, ventral hernia noted. non-tender and non-distended.  Msk:  Symmetrical without gross deformities. Normal posture. Extremities:  Without edema. Neurologic:  Alert and  oriented x4;  grossly normal neurologically. Skin:  Intact without significant lesions or rashes. Cervical Nodes:  No significant cervical adenopathy. Psych:  Alert and cooperative. Normal mood and affect.

## 2013-01-03 NOTE — Patient Instructions (Addendum)
We have set you up for another colonoscopy with Dr. Jena Gauss. I will let you know about the coumadin instructions in the next few days.   We will be in touch shortly regarding more blood work, if needed.   Further recommendations in the near future.

## 2013-01-10 ENCOUNTER — Encounter: Payer: Self-pay | Admitting: Gastroenterology

## 2013-01-11 ENCOUNTER — Telehealth: Payer: Self-pay | Admitting: Gastroenterology

## 2013-01-11 DIAGNOSIS — D369 Benign neoplasm, unspecified site: Secondary | ICD-10-CM | POA: Insufficient documentation

## 2013-01-11 NOTE — Progress Notes (Signed)
Faxed to PCP

## 2013-01-11 NOTE — Telephone Encounter (Signed)
Pt needs to be set up for a TCS for surveillance due to hx of adenomatous polyps. I saw him last week.     WILL NEED TO HOLD COUMADIN X 3 days prior. Needs lovenox bridging. 1mg /kg BID.

## 2013-01-11 NOTE — Assessment & Plan Note (Signed)
79 year old male with hx of IDA, recurrent PE, afib, lupus anticoagulant positive on chronic Coumadin. Recently found to be heme positive at his PCP. TCS and EGD on file from 01/2012. EGD essentially normal, and TCS with multiple flat adenomatous polyps. He was slated for a 6 month surveillance, but this is now somewhat overdue. He denies any signs of melena or hematochezia. Only complaint is fatigue.   Obtain most recent CBC, iron, ferritin Proceed with surveillance TCS due to multiple adenomatous polyps in Feb 2013 (somewhat overdue) Consider capsule study if labs note decreasing ferritin or Hgb

## 2013-01-11 NOTE — Assessment & Plan Note (Addendum)
Proceed with TCS with Dr. Jena Gauss in near future: the risks, benefits, and alternatives have been discussed with the patient in detail. The patient states understanding and desires to proceed.   LOVENOX BRIDGING due to complicated hx and chronic Coumadin.

## 2013-01-11 NOTE — Assessment & Plan Note (Signed)
Questionable early cirrhosis. AFP normal in 01/2012. Needs repeat now. Question r/t ETOH use.

## 2013-01-15 ENCOUNTER — Other Ambulatory Visit: Payer: Self-pay | Admitting: Internal Medicine

## 2013-01-15 DIAGNOSIS — D509 Iron deficiency anemia, unspecified: Secondary | ICD-10-CM

## 2013-01-15 DIAGNOSIS — R195 Other fecal abnormalities: Secondary | ICD-10-CM

## 2013-01-15 MED ORDER — PEG 3350-KCL-NA BICARB-NACL 420 G PO SOLR: 4000.0000 mL | ORAL | Status: DC

## 2013-01-15 NOTE — Telephone Encounter (Signed)
Patient is scheduled for 01/30 with RMR and I have mailed instructions and he is aware

## 2013-01-15 NOTE — Telephone Encounter (Signed)
Please see note, I couldn't addend it. Needs TCS for surveillance. I saw him last week.   Please let me know the date; I need to send in Lovenox shots for him.  Will need to hold Coumadin X 4 days prior. Will need to have plenty of time for [place] to get in touch with him about the injections and Coumadin. Thanks! Please just cc me back once a date is obtained.

## 2013-01-16 MED ORDER — ENOXAPARIN SODIUM 80 MG/0.8ML ~~LOC~~ SOLN: SUBCUTANEOUS | Status: DC

## 2013-01-16 NOTE — Telephone Encounter (Signed)
I spoke to pharmacist and verbally gave him the prescription for Lovenox.  Instructions:  Last dose of Coumadin on January 25th. Start Lovenox shots TWICE A DAY starting January 26th through the 28th. On January 29th, only take the morning dose of Lovenox. No evening dose. Procedure: January 30th. Will resume coumadin after procedure.

## 2013-01-17 ENCOUNTER — Encounter (HOSPITAL_COMMUNITY): Payer: Self-pay | Admitting: Pharmacy Technician

## 2013-01-17 NOTE — Telephone Encounter (Signed)
Explained how to give injections and [name]'s instructions on when to give injections. Pt verbalized understanding. Informed him that if he has any questions, he can call the office or if he needs a demonstration on how to give injections he can come by the office. Pt stated he would.

## 2013-01-24 ENCOUNTER — Encounter (HOSPITAL_COMMUNITY): Payer: Self-pay | Admitting: *Deleted

## 2013-01-24 ENCOUNTER — Encounter (HOSPITAL_COMMUNITY)
Admission: RE | Disposition: A | Discharge status: Home / Self Care | Payer: Self-pay | Source: Ambulatory Visit | Attending: Internal Medicine

## 2013-01-24 ENCOUNTER — Ambulatory Visit (HOSPITAL_COMMUNITY)
Admission: RE | Admit: 2013-01-24 | Discharge: 2013-01-24 | Disposition: A | Discharge status: Home / Self Care | Payer: Medicare Other | Source: Ambulatory Visit | Attending: Internal Medicine | Admitting: Internal Medicine

## 2013-01-24 DIAGNOSIS — Z860100 Personal history of colon polyps, unspecified: Secondary | ICD-10-CM | POA: Insufficient documentation

## 2013-01-24 DIAGNOSIS — R195 Other fecal abnormalities: Secondary | ICD-10-CM

## 2013-01-24 DIAGNOSIS — K5730 Diverticulosis of large intestine without perforation or abscess without bleeding: Secondary | ICD-10-CM

## 2013-01-24 DIAGNOSIS — D509 Iron deficiency anemia, unspecified: Secondary | ICD-10-CM

## 2013-01-24 DIAGNOSIS — D126 Benign neoplasm of colon, unspecified: Secondary | ICD-10-CM

## 2013-01-24 DIAGNOSIS — I10 Essential (primary) hypertension: Secondary | ICD-10-CM | POA: Insufficient documentation

## 2013-01-24 DIAGNOSIS — J4489 Other specified chronic obstructive pulmonary disease: Secondary | ICD-10-CM | POA: Insufficient documentation

## 2013-01-24 DIAGNOSIS — J449 Chronic obstructive pulmonary disease, unspecified: Secondary | ICD-10-CM | POA: Insufficient documentation

## 2013-01-24 DIAGNOSIS — K921 Melena: Secondary | ICD-10-CM | POA: Insufficient documentation

## 2013-01-24 HISTORY — PX: COLONOSCOPY: SHX5424

## 2013-01-24 SURGERY — COLONOSCOPY
Anesthesia: Moderate Sedation

## 2013-01-24 MED ORDER — STERILE WATER FOR IRRIGATION IR SOLN
Status: DC | PRN
  Administered 2013-01-24: 09:00:00

## 2013-01-24 MED ORDER — MEPERIDINE HCL 100 MG/ML IJ SOLN
INTRAMUSCULAR | Status: DC | PRN
  Administered 2013-01-24: 50 mg via INTRAVENOUS

## 2013-01-24 MED ORDER — MIDAZOLAM HCL 5 MG/5ML IJ SOLN
INTRAMUSCULAR | Status: AC
  Filled 2013-01-24: qty 10

## 2013-01-24 MED ORDER — MEPERIDINE HCL 100 MG/ML IJ SOLN
INTRAMUSCULAR | Status: AC
  Filled 2013-01-24: qty 2

## 2013-01-24 MED ORDER — MIDAZOLAM HCL 5 MG/5ML IJ SOLN
INTRAMUSCULAR | Status: DC | PRN
  Administered 2013-01-24: 1 mg via INTRAVENOUS
  Administered 2013-01-24: 2 mg via INTRAVENOUS
  Administered 2013-01-24: 1 mg via INTRAVENOUS

## 2013-01-24 MED ORDER — ONDANSETRON HCL 4 MG/2ML IJ SOLN
INTRAMUSCULAR | Status: AC
  Filled 2013-01-24: qty 2

## 2013-01-24 MED ORDER — SODIUM CHLORIDE 0.45 % IV SOLN
INTRAVENOUS | Status: DC
  Administered 2013-01-24: 08:00:00 via INTRAVENOUS

## 2013-01-24 MED ORDER — ONDANSETRON HCL 4 MG/2ML IJ SOLN
INTRAMUSCULAR | Status: DC | PRN
  Administered 2013-01-24: 4 mg via INTRAVENOUS

## 2013-01-24 NOTE — Op Note (Signed)
[facility] [address] [place], [address]   COLONOSCOPY PROCEDURE REPORT  PATIENT: [name], [name]  MR#:         [id number] BIRTHDATE: July 03, 1931 , 81  yrs. old GENDER: Male ENDOSCOPIST: R.  Roetta Sessions, MD FACP FACG REFERRED BY:  Joette Catching, M.D.  Nona Dell, M.D. PROCEDURE DATE:  01/24/2013 PROCEDURE:     Colonoscopy with biopsy and snare polypectomy  INDICATIONS: Hemoccult-positive stool. History of multiple colonic adenomas-overdue for surveillance examination  INFORMED CONSENT:  The risks, benefits, alternatives and imponderables including but not limited to bleeding, perforation as well as the possibility of a missed lesion have been reviewed.  The potential for biopsy, lesion removal, etc. have also been discussed.  Questions have been answered.  All parties agreeable. Please see the history and physical in the medical record for more information.  MEDICATIONS: Versed 4 mg IV and Demerol 50 mg IV in divided doses. Zofran 4 mg IV  DESCRIPTION OF PROCEDURE:  After a digital rectal exam was performed, the EC-[id number] ([id number])  colonoscope was advanced from the anus through the rectum and colon to the area of the cecum, ileocecal valve and appendiceal orifice.  The cecum was deeply intubated.  These structures were well-seen and photographed for the record.  From the level of the cecum and ileocecal valve, the scope was slowly and cautiously withdrawn.  The mucosal surfaces were carefully surveyed utilizing scope tip deflection to facilitate fold flattening as needed.  The scope was pulled down into the rectum where a thorough examination including retroflexion was performed.    FINDINGS:  preparation was inadequate for an complete examination. There was quite a bit of viscous colonic effluent and vegetable matter which made the exam much more difficult. A small or flat lesions may not have been obscured by the poor preparation.  Rectal mucosa grossly normal. Melanosis coli. Left-sided diverticula. (1) 8 mm flat polyp at the splenic flexure. The patient had (1) 5 mm flat polyp in the mid ascending segment. There was (1) diminutive polyp in the base of cecum. The remainder of the colonic mucosa that was seen appeared normal.  THERAPEUTIC / DIAGNOSTIC MANEUVERS PERFORMED:  the diminutive polyp was  cold biopsied / removed. The ascending and splenic flexure lesions were cold and hot snare/ removed, respectively.  COMPLICATIONS: None  CECAL WITHDRAWAL TIME:  12 minutes  IMPRESSION:  Colonic polyps-removed as described above. Colonic diverticulosis. Melanosis coli. Poor prep compromised examination  RECOMMENDATIONS: Resume Coumadin today. Resume Lovenox tomorrow. Continue Lovenox until INR therapeutic. Followup on pathology.   _______________________________ eSigned:  R. Roetta Sessions, MD FACP [facility] 01/24/2013 9:28 AM   CC:    PATIENT NAME:  [name], [name] MR#: [id number]

## 2013-01-24 NOTE — H&P (View-Only) (Signed)
 Referring Provider: Nyland, Leonard, MD Primary Care Physician:  NYLAND,LEONARD ROBERT, MD Primary Gastroenterologist: Dr. Rourk   Chief Complaint  Patient presents with  . Rectal Bleeding    heme + stools    HPI:   79-year-old male with hx of IDA, recurrent PE, afib, lupus anticoagulant positive on chronic Coumadin. Recently found to be heme positive at his PCP. Last seen by our office approximately a year ago; he underwent multiple scans due to an original abnormal finding on CT. Noted pancreas divisum, gallbladder stones and sludge. Pt has since had a cholecystectomy. During this work-up, he was found to have possible early cirrhosis. Hx of ETOH abuse. AFP Feb 2013 normal. His last colonoscopy was Feb 2013 with multiple flat, adenomatous polyps. He is overdue for a 6 month surveillance now.  Pt denies any overt GI bleeding. Notes "black" stools, but he is taking iron. No abdominal pain,no N/V. Had short episode of GI bug but better now. No bright red blood per rectum. Always tired, moreso than normal. No change in bowel habits. BM every 3-4 days. Omeprazole BID for GERD. No dysphagia.    Past Medical History  Diagnosis Date  . COPD (chronic obstructive pulmonary disease)     Uses occasional nighttime O2  . Disseminated herpes zoster 2010  . GERD (gastroesophageal reflux disease)   . Asthma   . Pulmonary embolus     2003 and 2011  . Pulmonary nodules      Chest CT 09/11  . Mixed hyperlipidemia   . Atrial fibrillation     CHADS2 score 2  . Lupus anticoagulant positive   . Hypertension   . Gall stones   . Rheumatoid arthritis   . History of chicken pox 1941; 2011  . Anemia   . Chronic lower back pain   . Edema     Evaluated by Dr. McDowell - felt more likely component of cor pulmonale due to chronic lung disease (EF  60-65%, grade 2 diastolic dysfunction by echo 01/2012)  . Cirrhosis     questionable, AFP normal Feb 01/2012, hx of ETOH use     Past Surgical History    Procedure Date  . Polypectomy 02/02/2012    RMR: Multiple colonic polyps removed, flat, tubular adenomas/ Left-sided diverticulosis  . Posterior lumbar vertebrae excision 2003; 2009; 2011  . Myringotomy     "3 times; both ears"  . Cataract extraction w/ intraocular lens  implant, bilateral   . Cholecystectomy 05/23/2012    Procedure: LAPAROSCOPIC CHOLECYSTECTOMY WITH INTRAOPERATIVE CHOLANGIOGRAM;  Surgeon: Faera Byerly, MD;  Location: [facility] OR;  Service: General;  Laterality: N/A;  . Esophagogastroduodenoscopy 02/02/12    RMR:Moderate size hiatal hernia; otherwise normal exam    Current Outpatient Prescriptions  Medication Sig Dispense Refill  . albuterol (PROAIR HFA) 108 (90 BASE) MCG/ACT inhaler Inhale 2 puffs into the lungs every 6 (six) hours as needed. Shortness of Breath      . albuterol (PROVENTIL) (2.5 MG/3ML) 0.083% nebulizer solution Take 2.5 mg by nebulization every 6 (six) hours as needed. Shortness of Breath      . cetirizine (ZYRTEC) 10 MG tablet Take 10 mg by mouth daily.        . cloNIDine (CATAPRES) 0.1 MG tablet Take 0.1 mg by mouth 2 (two) times daily.        . diltiazem (CARTIA XT) 240 MG 24 hr capsule Take 1 capsule (240 mg total) by mouth at bedtime.  30 capsule  11  . Etanercept (ENBREL)   25 MG/0.5ML SOLN Inject 25 mg into the skin 2 (two) times a week.       . folic acid (FOLVITE) 1 MG tablet Take 1 mg by mouth daily.       . furosemide (LASIX) 40 MG tablet Take 40 mg by mouth 2 (two) times daily.       . gabapentin (NEURONTIN) 300 MG capsule Take 300 mg by mouth 3 (three) times daily.       . methotrexate (RHEUMATREX) 2.5 MG tablet Take 2.5 mg by mouth once a week. *TAKE 5 TABLETS BY MOUTH FOR A TOTAL 12.5MG WEEKLY ON FRIDAYS*      . Omega-3 Fatty Acids (FISH OIL) 1000 MG CAPS Take 1 capsule by mouth 2 (two) times daily.       . omeprazole (PRILOSEC) 20 MG capsule Take 20 mg by mouth 2 (two) times daily.       . oxyCODONE-acetaminophen (PERCOCET) 10-325 MG per tablet Take  1 tablet by mouth every 8 (eight) hours as needed for pain.  30 tablet  0  . potassium chloride SA (K-DUR,KLOR-CON) 20 MEQ tablet Take 20 mEq by mouth 2 (two) times daily.        . warfarin (COUMADIN) 2 MG tablet Take 5-7.5 mg by mouth every evening. Take two & one-half tablets (5mg total) daily except take three and one-half tablet (7.5mg total) on Wednesdays.      . azithromycin (ZITHROMAX) 250 MG tablet Take 1 tablet (250 mg total) by mouth daily.  3 each  0  . cefUROXime (CEFTIN) 500 MG tablet Take 1 tablet (500 mg total) by mouth 2 (two) times daily.  10 tablet  0    Allergies as of 01/03/2013 - Review Complete 01/03/2013  Allergen Reaction Noted  . Cymbalta (duloxetine hcl) Other (See Comments) 05/21/2012  . Procaine hcl Hives and Other (See Comments)     Family History  Problem Relation Age of Onset  . Colon cancer Neg Hx   . Liver disease Neg Hx     History   Social History  . Marital Status: Married    Spouse Name: N/A    Number of Children: 6  . Years of Education: N/A   Occupational History  . retired    Social History Main Topics  . Smoking status: Former Smoker -- 1.0 packs/day for 57 years    Types: Cigarettes    Quit date: 01/27/1998  . Smokeless tobacco: Never Used  . Alcohol Use: No     Comment: "quit drinking 1967"  . Drug Use: No  . Sexually Active: Not Currently   Other Topics Concern  . None   Social History Narrative  . None    Review of Systems: Gen: Denies fever, chills, anorexia. Denies fatigue, weakness, weight loss.  CV: occasional chest discomfort.  Resp: +DOE GI: SEE HPI Derm: Denies rash, itching, dry skin Psych: Denies depression, anxiety, memory loss, confusion. No homicidal or suicidal ideation.  Heme: Denies bruising, bleeding, and enlarged lymph nodes.  Physical Exam: BP 112/68  Pulse 85  Temp 97.9 F (36.6 C) (Oral)  Ht 6' 1" (1.854 m)  Wt 226 lb 6.4 oz (102.694 kg)  BMI 29.87 kg/m2 General:   Alert and oriented. No  distress noted. Pleasant and cooperative.  Head:  Normocephalic and atraumatic. Eyes:  Conjuctiva clear without scleral icterus. Mouth:  Oral mucosa pink and moist.  Neck:  Supple, without mass or thyromegaly. Heart:  S1, S2 present without murmurs, rubs, or gallops.   Regular rate and rhythm. Abdomen:  +BS, soft, large AP diameter, ventral hernia noted. non-tender and non-distended.  Msk:  Symmetrical without gross deformities. Normal posture. Extremities:  Without edema. Neurologic:  Alert and  oriented x4;  grossly normal neurologically. Skin:  Intact without significant lesions or rashes. Cervical Nodes:  No significant cervical adenopathy. Psych:  Alert and cooperative. Normal mood and affect.  

## 2013-01-24 NOTE — Interval H&P Note (Signed)
History and Physical Interval Note:  01/24/2013 8:38 AM  [name]  has presented today for surgery, with the diagnosis of HEME POSITIVE STOOL AND IDA  The various methods of treatment have been discussed with the patient and family. After consideration of risks, benefits and other options for treatment, the patient has consented to  Procedure(s) (LRB) with comments: COLONOSCOPY (N/A) - 8:30 as a surgical intervention .  The patient's history has been reviewed, patient examined, no change in status, stable for surgery.  I have reviewed the patient's chart and labs.  Questions were answered to the patient's satisfaction.     [name]  Heme positive. No hematochezia. Colonoscopy per plan.The risks, benefits, limitations, alternatives and imponderables have been reviewed with the patient. Questions have been answered. All parties are agreeable.

## 2013-01-25 ENCOUNTER — Encounter (HOSPITAL_COMMUNITY): Payer: Self-pay | Admitting: Internal Medicine

## 2013-01-28 ENCOUNTER — Encounter: Payer: Self-pay | Admitting: *Deleted

## 2013-01-28 ENCOUNTER — Encounter: Payer: Self-pay | Admitting: Internal Medicine

## 2013-04-05 ENCOUNTER — Emergency Department (HOSPITAL_COMMUNITY): Payer: Medicare Other

## 2013-04-05 ENCOUNTER — Emergency Department (HOSPITAL_COMMUNITY)
Admission: EM | Admit: 2013-04-05 | Discharge: 2013-04-05 | Disposition: A | Discharge status: Home / Self Care | Payer: Medicare Other | Attending: Emergency Medicine | Admitting: Emergency Medicine

## 2013-04-05 ENCOUNTER — Encounter (HOSPITAL_COMMUNITY): Payer: Self-pay | Admitting: *Deleted

## 2013-04-05 DIAGNOSIS — G8929 Other chronic pain: Secondary | ICD-10-CM | POA: Insufficient documentation

## 2013-04-05 DIAGNOSIS — J4489 Other specified chronic obstructive pulmonary disease: Secondary | ICD-10-CM | POA: Insufficient documentation

## 2013-04-05 DIAGNOSIS — Z8639 Personal history of other endocrine, nutritional and metabolic disease: Secondary | ICD-10-CM | POA: Insufficient documentation

## 2013-04-05 DIAGNOSIS — Z862 Personal history of diseases of the blood and blood-forming organs and certain disorders involving the immune mechanism: Secondary | ICD-10-CM | POA: Insufficient documentation

## 2013-04-05 DIAGNOSIS — M19079 Primary osteoarthritis, unspecified ankle and foot: Secondary | ICD-10-CM | POA: Insufficient documentation

## 2013-04-05 DIAGNOSIS — Z8679 Personal history of other diseases of the circulatory system: Secondary | ICD-10-CM | POA: Insufficient documentation

## 2013-04-05 DIAGNOSIS — M19072 Primary osteoarthritis, left ankle and foot: Secondary | ICD-10-CM

## 2013-04-05 DIAGNOSIS — J449 Chronic obstructive pulmonary disease, unspecified: Secondary | ICD-10-CM | POA: Insufficient documentation

## 2013-04-05 DIAGNOSIS — Z79899 Other long term (current) drug therapy: Secondary | ICD-10-CM | POA: Insufficient documentation

## 2013-04-05 DIAGNOSIS — K219 Gastro-esophageal reflux disease without esophagitis: Secondary | ICD-10-CM | POA: Insufficient documentation

## 2013-04-05 DIAGNOSIS — Z8619 Personal history of other infectious and parasitic diseases: Secondary | ICD-10-CM | POA: Insufficient documentation

## 2013-04-05 DIAGNOSIS — Z86711 Personal history of pulmonary embolism: Secondary | ICD-10-CM | POA: Insufficient documentation

## 2013-04-05 DIAGNOSIS — Z7901 Long term (current) use of anticoagulants: Secondary | ICD-10-CM | POA: Insufficient documentation

## 2013-04-05 DIAGNOSIS — Z87891 Personal history of nicotine dependence: Secondary | ICD-10-CM | POA: Insufficient documentation

## 2013-04-05 DIAGNOSIS — Z8719 Personal history of other diseases of the digestive system: Secondary | ICD-10-CM | POA: Insufficient documentation

## 2013-04-05 DIAGNOSIS — D649 Anemia, unspecified: Secondary | ICD-10-CM | POA: Insufficient documentation

## 2013-04-05 DIAGNOSIS — I10 Essential (primary) hypertension: Secondary | ICD-10-CM | POA: Insufficient documentation

## 2013-04-05 DIAGNOSIS — Z8739 Personal history of other diseases of the musculoskeletal system and connective tissue: Secondary | ICD-10-CM | POA: Insufficient documentation

## 2013-04-05 MED ORDER — OXYCODONE-ACETAMINOPHEN 5-325 MG PO TABS
2.0000 | ORAL_TABLET | Freq: Once | ORAL | Status: AC
  Administered 2013-04-05: 2 via ORAL
  Filled 2013-04-05: qty 2

## 2013-04-05 NOTE — ED Notes (Signed)
C/o left foot pain x 1 month, gradually worsening; reports that his foot was stepped on by a lady's high heel shoe.

## 2013-04-05 NOTE — ED Provider Notes (Signed)
History     CSN: [id number]  Arrival date & time 04/05/13  1550   First MD Initiated Contact with Patient 04/05/13 1609      Chief Complaint  Patient presents with  . Foot Pain    (Consider location/radiation/quality/duration/timing/severity/associated sxs/prior treatment) The history is provided by the patient.   patient reports pain in his left foot for several months.  He reports it worsened over the past several days.  No fevers or chills.  No numbness tingling or weakness.  He reports his pain began several months ago when "a fat lady stepped on it."  He states ever since then he's continued to have pain.  He's never seat evaluation for the cause of his pain.  He can walk on his own and bear weight although he states ever causes pain and he limps somewhat.  No recent trauma to his left foot.  His pain is mild to moderate in severity.  Past Medical History  Diagnosis Date  . COPD (chronic obstructive pulmonary disease)     Uses occasional nighttime O2  . Disseminated herpes zoster 2010  . GERD (gastroesophageal reflux disease)   . Asthma   . Pulmonary embolus     2003 and 2011  . Pulmonary nodules      Chest CT 09/11  . Mixed hyperlipidemia   . Atrial fibrillation     CHADS2 score 2  . Lupus anticoagulant positive   . Hypertension   . Gall stones   . Rheumatoid arthritis   . History of chicken pox 1941; 2011  . Anemia   . Chronic lower back pain   . Edema     Evaluated by Dr. Diona Browner - felt more likely component of cor pulmonale due to chronic lung disease (EF  60-65%, grade 2 diastolic dysfunction by echo 01/2012)  . Cirrhosis     questionable, AFP normal Feb 01/2012, hx of ETOH use     Past Surgical History  Procedure Laterality Date  . Polypectomy  02/02/2012    RMR: Multiple colonic polyps removed, flat, tubular adenomas/ Left-sided diverticulosis  . Posterior lumbar vertebrae excision  2003; 2009; 2011  . Myringotomy      "3 times; both ears"  . Cataract  extraction w/ intraocular lens  implant, bilateral    . Cholecystectomy  05/23/2012    Procedure: LAPAROSCOPIC CHOLECYSTECTOMY WITH INTRAOPERATIVE CHOLANGIOGRAM;  Surgeon: Almond Lint, MD;  Location: [facility] OR;  Service: General;  Laterality: N/A;  . Esophagogastroduodenoscopy  02/02/12    WUJ:[id number] size hiatal hernia; otherwise normal exam  . Colonoscopy  01/24/2013    Procedure: COLONOSCOPY;  Surgeon: Corbin Ade, MD;  Location: [facility];  Service: Endoscopy;  Laterality: N/A;  8:30    Family History  Problem Relation Age of Onset  . Colon cancer Neg Hx   . Liver disease Neg Hx     History  Substance Use Topics  . Smoking status: Former Smoker -- 1.00 packs/day for 57 years    Types: Cigarettes    Quit date: 01/27/1998  . Smokeless tobacco: Never Used  . Alcohol Use: No     Comment: "quit drinking 1967"      Review of Systems  All other systems reviewed and are negative.    Allergies  Cymbalta and Procaine hcl  Home Medications   Current Outpatient Rx  Name  Route  Sig  Dispense  Refill  . albuterol (PROAIR HFA) 108 (90 BASE) MCG/ACT inhaler   Inhalation   Inhale  2 puffs into the lungs every 6 (six) hours as needed. Shortness of Breath         . albuterol (PROVENTIL) (2.5 MG/3ML) 0.083% nebulizer solution   Nebulization   Take 2.5 mg by nebulization every 6 (six) hours as needed. Shortness of Breath         . cetirizine (ZYRTEC) 10 MG tablet   Oral   Take 10 mg by mouth daily.           . cloNIDine (CATAPRES) 0.1 MG tablet   Oral   Take 0.1 mg by mouth 2 (two) times daily.           Marland Kitchen diltiazem (CARTIA XT) 240 MG 24 hr capsule   Oral   Take 1 capsule (240 mg total) by mouth at bedtime.   30 capsule   11   . Etanercept (ENBREL) 25 MG/0.5ML SOLN   Subcutaneous   Inject 25 mg into the skin 2 (two) times a week.          . ferrous gluconate (FERGON) 325 MG tablet   Oral   Take 325 mg by mouth daily with breakfast.         . folic acid  (FOLVITE) 1 MG tablet   Oral   Take 1 mg by mouth daily.          . furosemide (LASIX) 40 MG tablet   Oral   Take 40 mg by mouth 2 (two) times daily.          Marland Kitchen gabapentin (NEURONTIN) 300 MG capsule   Oral   Take 300 mg by mouth 2 (two) times daily.          . methotrexate (RHEUMATREX) 2.5 MG tablet   Oral   Take 2.5 mg by mouth once a week. *TAKE 5 TABLETS BY MOUTH FOR A TOTAL 12.5MG  WEEKLY ON FRIDAYS*         . Omega-3 Fatty Acids (FISH OIL) 1000 MG CAPS   Oral   Take 1 capsule by mouth 2 (two) times daily.          Marland Kitchen omeprazole (PRILOSEC) 20 MG capsule   Oral   Take 20 mg by mouth 2 (two) times daily.          Marland Kitchen oxyCODONE-acetaminophen (PERCOCET) 10-325 MG per tablet   Oral   Take 1 tablet by mouth every 6 (six) hours as needed for pain.         . potassium chloride SA (K-DUR,KLOR-CON) 20 MEQ tablet   Oral   Take 20 mEq by mouth 2 (two) times daily.           Marland Kitchen warfarin (COUMADIN) 2 MG tablet   Oral   Take 5-7 mg by mouth every evening. Takes 5mg  daily except takes 7mg  on Wednesdays           BP 142/94  Pulse 81  Temp(Src) 97.7 F (36.5 C) (Oral)  Resp 20  Ht 6\' 1"  (1.854 m)  Wt 217 lb (98.431 kg)  BMI 28.64 kg/m2  SpO2 96%  Physical Exam  Constitutional: He is oriented to person, place, and time. He appears well-developed and well-nourished.  HENT:  Head: Normocephalic.  Eyes: EOM are normal.  Neck: Normal range of motion.  Pulmonary/Chest: Effort normal.  Abdominal: He exhibits no distension.  Musculoskeletal: Normal range of motion.  Tenderness to the left midfoot without focal tenderness.  Normal pulses in left foot.  Left foot is perfused warm and with  normal motor strength.  He can wiggle his toes.  He has normal sensation in his left foot.  No left leg or calf swelling.  Neurological: He is alert and oriented to person, place, and time.  Psychiatric: He has a normal mood and affect.    ED Course  Procedures (including critical  care time)  Labs Reviewed - No data to display Dg Foot Complete Left  04/05/2013  *RADIOLOGY REPORT*  Clinical Data: Larey Seat approximately 3 months ago, persistent pain and swelling of the left foot.  LEFT FOOT - COMPLETE 3+ VIEW  Comparison: None.  Findings: No evidence of acute or subacute fracture or dislocation. Moderate to severe osseous demineralization.  Narrowing involving the IP joint spaces of essentially all of the toes.  Mid foot and hind foot joint spaces well preserved.  Tiny plantar calcaneal spur.  Calcification involving the small arteries of the foot.  IMPRESSION: No acute or subacute osseous abnormality.  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  Osseous demineralization.   Original Report Authenticated By: Hulan Saas, M.D.    I personally reviewed the imaging tests through PACS system I reviewed available ER/hospitalization records through the EMR   1. Arthritis of left foot       MDM  X-ray consistent with osteoarthritis.  No fracture.  Discharge home with orthopedic and PCP followup.  Patient started on Percocet at home.        Lyanne Co, MD 04/05/13 2229

## 2013-06-13 ENCOUNTER — Emergency Department (HOSPITAL_COMMUNITY): Payer: Medicare Other

## 2013-06-13 ENCOUNTER — Inpatient Hospital Stay (HOSPITAL_COMMUNITY)
Admission: EM | Admit: 2013-06-13 | Discharge: 2013-06-18 | DRG: 871 | Disposition: A | Discharge status: Home Health | Payer: Medicare Other | Attending: Internal Medicine | Admitting: Internal Medicine

## 2013-06-13 ENCOUNTER — Encounter (HOSPITAL_COMMUNITY): Payer: Self-pay | Admitting: Emergency Medicine

## 2013-06-13 DIAGNOSIS — Z87891 Personal history of nicotine dependence: Secondary | ICD-10-CM

## 2013-06-13 DIAGNOSIS — M1990 Unspecified osteoarthritis, unspecified site: Secondary | ICD-10-CM

## 2013-06-13 DIAGNOSIS — R609 Edema, unspecified: Secondary | ICD-10-CM

## 2013-06-13 DIAGNOSIS — I4891 Unspecified atrial fibrillation: Secondary | ICD-10-CM

## 2013-06-13 DIAGNOSIS — E669 Obesity, unspecified: Secondary | ICD-10-CM

## 2013-06-13 DIAGNOSIS — I10 Essential (primary) hypertension: Secondary | ICD-10-CM

## 2013-06-13 DIAGNOSIS — A419 Sepsis, unspecified organism: Principal | ICD-10-CM

## 2013-06-13 DIAGNOSIS — Z7901 Long term (current) use of anticoagulants: Secondary | ICD-10-CM

## 2013-06-13 DIAGNOSIS — M069 Rheumatoid arthritis, unspecified: Secondary | ICD-10-CM

## 2013-06-13 DIAGNOSIS — M545 Low back pain, unspecified: Secondary | ICD-10-CM | POA: Diagnosis present

## 2013-06-13 DIAGNOSIS — I2510 Atherosclerotic heart disease of native coronary artery without angina pectoris: Secondary | ICD-10-CM | POA: Diagnosis present

## 2013-06-13 DIAGNOSIS — R894 Abnormal immunological findings in specimens from other organs, systems and tissues: Secondary | ICD-10-CM | POA: Diagnosis present

## 2013-06-13 DIAGNOSIS — R0902 Hypoxemia: Secondary | ICD-10-CM

## 2013-06-13 DIAGNOSIS — R760 Raised antibody titer: Secondary | ICD-10-CM

## 2013-06-13 DIAGNOSIS — J45901 Unspecified asthma with (acute) exacerbation: Secondary | ICD-10-CM | POA: Diagnosis present

## 2013-06-13 DIAGNOSIS — K219 Gastro-esophageal reflux disease without esophagitis: Secondary | ICD-10-CM

## 2013-06-13 DIAGNOSIS — J984 Other disorders of lung: Secondary | ICD-10-CM

## 2013-06-13 DIAGNOSIS — I5032 Chronic diastolic (congestive) heart failure: Secondary | ICD-10-CM | POA: Diagnosis present

## 2013-06-13 DIAGNOSIS — K7460 Unspecified cirrhosis of liver: Secondary | ICD-10-CM

## 2013-06-13 DIAGNOSIS — J189 Pneumonia, unspecified organism: Secondary | ICD-10-CM

## 2013-06-13 DIAGNOSIS — D509 Iron deficiency anemia, unspecified: Secondary | ICD-10-CM

## 2013-06-13 DIAGNOSIS — Z66 Do not resuscitate: Secondary | ICD-10-CM | POA: Diagnosis present

## 2013-06-13 DIAGNOSIS — D72829 Elevated white blood cell count, unspecified: Secondary | ICD-10-CM

## 2013-06-13 DIAGNOSIS — J449 Chronic obstructive pulmonary disease, unspecified: Secondary | ICD-10-CM

## 2013-06-13 DIAGNOSIS — I482 Chronic atrial fibrillation, unspecified: Secondary | ICD-10-CM | POA: Diagnosis present

## 2013-06-13 DIAGNOSIS — N170 Acute kidney failure with tubular necrosis: Secondary | ICD-10-CM | POA: Diagnosis present

## 2013-06-13 DIAGNOSIS — D369 Benign neoplasm, unspecified site: Secondary | ICD-10-CM

## 2013-06-13 DIAGNOSIS — I509 Heart failure, unspecified: Secondary | ICD-10-CM | POA: Diagnosis present

## 2013-06-13 DIAGNOSIS — E782 Mixed hyperlipidemia: Secondary | ICD-10-CM | POA: Diagnosis present

## 2013-06-13 DIAGNOSIS — J441 Chronic obstructive pulmonary disease with (acute) exacerbation: Secondary | ICD-10-CM | POA: Diagnosis present

## 2013-06-13 DIAGNOSIS — G8929 Other chronic pain: Secondary | ICD-10-CM | POA: Diagnosis present

## 2013-06-13 DIAGNOSIS — Z79899 Other long term (current) drug therapy: Secondary | ICD-10-CM

## 2013-06-13 DIAGNOSIS — K810 Acute cholecystitis: Secondary | ICD-10-CM

## 2013-06-13 DIAGNOSIS — D649 Anemia, unspecified: Secondary | ICD-10-CM

## 2013-06-13 DIAGNOSIS — Z6835 Body mass index (BMI) 35.0-35.9, adult: Secondary | ICD-10-CM

## 2013-06-13 DIAGNOSIS — N179 Acute kidney failure, unspecified: Secondary | ICD-10-CM

## 2013-06-13 DIAGNOSIS — J4489 Other specified chronic obstructive pulmonary disease: Secondary | ICD-10-CM

## 2013-06-13 DIAGNOSIS — J9600 Acute respiratory failure, unspecified whether with hypoxia or hypercapnia: Secondary | ICD-10-CM

## 2013-06-13 DIAGNOSIS — I2699 Other pulmonary embolism without acute cor pulmonale: Secondary | ICD-10-CM

## 2013-06-13 LAB — COMPREHENSIVE METABOLIC PANEL
BUN: 12 mg/dL (ref 6–23)
CO2: 28 mEq/L (ref 19–32)
Chloride: 104 mEq/L (ref 96–112)
Creatinine, Ser: 1.04 mg/dL (ref 0.50–1.35)
GFR calc Af Amer: 77 mL/min — ABNORMAL LOW (ref >90)
GFR calc non Af Amer: 66 mL/min — ABNORMAL LOW (ref >90)
Total Bilirubin: 2.2 mg/dL — ABNORMAL HIGH (ref 0.3–1.2)

## 2013-06-13 LAB — URINE MICROSCOPIC-ADD ON

## 2013-06-13 LAB — CBC WITH DIFFERENTIAL/PLATELET
HCT: 41.9 % (ref 39.0–52.0)
Hemoglobin: 13.6 g/dL (ref 13.0–17.0)
Lymphocytes Relative: 9 % — ABNORMAL LOW (ref 12–46)
MCHC: 32.5 g/dL (ref 30.0–36.0)
MCV: 96.3 fL (ref 78.0–100.0)
Monocytes Absolute: 1.5 10*3/uL — ABNORMAL HIGH (ref 0.1–1.0)
Monocytes Relative: 10 % (ref 3–12)
Neutro Abs: 11.4 10*3/uL — ABNORMAL HIGH (ref 1.7–7.7)
WBC: 14.5 10*3/uL — ABNORMAL HIGH (ref 4.0–10.5)

## 2013-06-13 LAB — URINALYSIS, ROUTINE W REFLEX MICROSCOPIC
Glucose, UA: NEGATIVE mg/dL
Ketones, ur: NEGATIVE mg/dL
Leukocytes, UA: NEGATIVE
pH: 7 (ref 5.0–8.0)

## 2013-06-13 LAB — LIPASE, BLOOD: Lipase: 21 U/L (ref 11–59)

## 2013-06-13 LAB — LACTIC ACID, PLASMA: Lactic Acid, Venous: 1.1 mmol/L (ref 0.5–2.2)

## 2013-06-13 MED ORDER — METOPROLOL TARTRATE 1 MG/ML IV SOLN
5.0000 mg | Freq: Once | INTRAVENOUS | Status: AC
  Administered 2013-06-13: 5 mg via INTRAVENOUS
  Filled 2013-06-13: qty 5

## 2013-06-13 MED ORDER — IPRATROPIUM BROMIDE 0.02 % IN SOLN
0.5000 mg | Freq: Once | RESPIRATORY_TRACT | Status: AC
  Administered 2013-06-13: 0.5 mg via RESPIRATORY_TRACT
  Filled 2013-06-13: qty 2.5

## 2013-06-13 MED ORDER — MORPHINE SULFATE 2 MG/ML IJ SOLN
2.0000 mg | INTRAMUSCULAR | Status: DC | PRN
  Filled 2013-06-13: qty 1

## 2013-06-13 MED ORDER — ALBUTEROL SULFATE (5 MG/ML) 0.5% IN NEBU
2.5000 mg | INHALATION_SOLUTION | Freq: Once | RESPIRATORY_TRACT | Status: AC
  Administered 2013-06-13: 2.5 mg via RESPIRATORY_TRACT
  Filled 2013-06-13: qty 0.5

## 2013-06-13 MED ORDER — DEXTROSE 5 % IV SOLN
500.0000 mg | Freq: Once | INTRAVENOUS | Status: DC
  Administered 2013-06-13: 500 mg via INTRAVENOUS
  Filled 2013-06-13: qty 500

## 2013-06-13 MED ORDER — DILTIAZEM HCL 100 MG IV SOLR
5.0000 mg/h | INTRAVENOUS | Status: DC
  Administered 2013-06-13: 15 mg/h via INTRAVENOUS
  Administered 2013-06-13: 5 mg/h via INTRAVENOUS
  Administered 2013-06-14: 10 mg/h via INTRAVENOUS
  Filled 2013-06-13: qty 100

## 2013-06-13 MED ORDER — OXYCODONE-ACETAMINOPHEN 5-325 MG PO TABS
1.0000 | ORAL_TABLET | Freq: Once | ORAL | Status: AC
  Administered 2013-06-13: 1 via ORAL
  Filled 2013-06-13: qty 1

## 2013-06-13 MED ORDER — IOHEXOL 300 MG/ML  SOLN
50.0000 mL | Freq: Once | INTRAMUSCULAR | Status: AC | PRN
  Administered 2013-06-13: 50 mL via ORAL

## 2013-06-13 MED ORDER — DILTIAZEM HCL 25 MG/5ML IV SOLN
5.0000 mg | Freq: Once | INTRAVENOUS | Status: AC
  Administered 2013-06-13: 5 mg via INTRAVENOUS

## 2013-06-13 MED ORDER — OXYCODONE-ACETAMINOPHEN 10-325 MG PO TABS: 1.0000 | ORAL_TABLET | Freq: Four times a day (QID) | ORAL | Status: DC | PRN

## 2013-06-13 MED ORDER — AZITHROMYCIN 500 MG IV SOLR
500.0000 mg | INTRAVENOUS | Status: DC
  Administered 2013-06-14 – 2013-06-15 (×2): 500 mg via INTRAVENOUS
  Filled 2013-06-13 (×3): qty 500

## 2013-06-13 MED ORDER — ACETAMINOPHEN 650 MG RE SUPP: 650.0000 mg | Freq: Four times a day (QID) | RECTAL | Status: DC | PRN

## 2013-06-13 MED ORDER — OXYCODONE-ACETAMINOPHEN 5-325 MG PO TABS
1.0000 | ORAL_TABLET | Freq: Four times a day (QID) | ORAL | Status: DC | PRN
  Administered 2013-06-13 – 2013-06-15 (×4): 1 via ORAL
  Filled 2013-06-13 (×4): qty 1

## 2013-06-13 MED ORDER — SODIUM CHLORIDE 0.9 % IV SOLN: INTRAVENOUS | Status: DC

## 2013-06-13 MED ORDER — METHOTREXATE 2.5 MG PO TABS
12.5000 mg | ORAL_TABLET | ORAL | Status: DC
  Filled 2013-06-13: qty 5

## 2013-06-13 MED ORDER — CLONIDINE HCL 0.1 MG PO TABS
0.1000 mg | ORAL_TABLET | Freq: Two times a day (BID) | ORAL | Status: DC
  Administered 2013-06-13: 0.1 mg via ORAL
  Filled 2013-06-13: qty 1

## 2013-06-13 MED ORDER — PANTOPRAZOLE SODIUM 40 MG PO TBEC
40.0000 mg | DELAYED_RELEASE_TABLET | Freq: Every day | ORAL | Status: DC
  Administered 2013-06-13 – 2013-06-18 (×6): 40 mg via ORAL
  Filled 2013-06-13 (×6): qty 1

## 2013-06-13 MED ORDER — IOHEXOL 350 MG/ML SOLN
100.0000 mL | Freq: Once | INTRAVENOUS | Status: AC | PRN
  Administered 2013-06-13: 100 mL via INTRAVENOUS

## 2013-06-13 MED ORDER — SODIUM CHLORIDE 0.9 % IV BOLUS (SEPSIS)
500.0000 mL | Freq: Once | INTRAVENOUS | Status: AC
  Administered 2013-06-13: 20:00:00 via INTRAVENOUS

## 2013-06-13 MED ORDER — OXYCODONE HCL 5 MG PO TABS
5.0000 mg | ORAL_TABLET | Freq: Four times a day (QID) | ORAL | Status: DC | PRN
  Administered 2013-06-13 – 2013-06-16 (×6): 5 mg via ORAL
  Filled 2013-06-13 (×6): qty 1

## 2013-06-13 MED ORDER — ONDANSETRON HCL 4 MG/2ML IJ SOLN: 4.0000 mg | Freq: Once | INTRAMUSCULAR | Status: AC

## 2013-06-13 MED ORDER — SODIUM CHLORIDE 0.9 % IV SOLN
INTRAVENOUS | Status: DC
  Administered 2013-06-14 – 2013-06-16 (×2): via INTRAVENOUS

## 2013-06-13 MED ORDER — ONDANSETRON HCL 4 MG/2ML IJ SOLN: 4.0000 mg | Freq: Three times a day (TID) | INTRAMUSCULAR | Status: DC | PRN

## 2013-06-13 MED ORDER — METOPROLOL TARTRATE 1 MG/ML IV SOLN
5.0000 mg | Freq: Four times a day (QID) | INTRAVENOUS | Status: DC
  Administered 2013-06-13: 5 mg via INTRAVENOUS
  Filled 2013-06-13: qty 5

## 2013-06-13 MED ORDER — ONDANSETRON HCL 4 MG/2ML IJ SOLN: 4.0000 mg | Freq: Four times a day (QID) | INTRAMUSCULAR | Status: DC | PRN

## 2013-06-13 MED ORDER — DEXTROSE 5 % IV SOLN
1.0000 g | INTRAVENOUS | Status: DC
  Administered 2013-06-14 – 2013-06-17 (×4): 1 g via INTRAVENOUS
  Filled 2013-06-13 (×5): qty 10

## 2013-06-13 MED ORDER — FUROSEMIDE 10 MG/ML IJ SOLN
40.0000 mg | Freq: Once | INTRAMUSCULAR | Status: AC
  Administered 2013-06-13: 40 mg via INTRAVENOUS
  Filled 2013-06-13: qty 4

## 2013-06-13 MED ORDER — DEXTROSE 5 % IV SOLN
1.0000 g | Freq: Once | INTRAVENOUS | Status: AC
  Administered 2013-06-13: 1 g via INTRAVENOUS
  Filled 2013-06-13: qty 10

## 2013-06-13 MED ORDER — ACETAMINOPHEN 325 MG PO TABS
650.0000 mg | ORAL_TABLET | Freq: Four times a day (QID) | ORAL | Status: DC | PRN
  Administered 2013-06-13 – 2013-06-18 (×8): 650 mg via ORAL
  Filled 2013-06-13 (×8): qty 2

## 2013-06-13 MED ORDER — MUPIROCIN 2 % EX OINT
1.0000 "application " | TOPICAL_OINTMENT | Freq: Two times a day (BID) | CUTANEOUS | Status: DC
  Administered 2013-06-14 – 2013-06-18 (×9): 1 via NASAL
  Filled 2013-06-13: qty 22

## 2013-06-13 MED ORDER — CHLORHEXIDINE GLUCONATE CLOTH 2 % EX PADS
6.0000 | MEDICATED_PAD | Freq: Every day | CUTANEOUS | Status: AC
  Administered 2013-06-14 – 2013-06-18 (×5): 6 via TOPICAL

## 2013-06-13 MED ORDER — ONDANSETRON HCL 4 MG PO TABS
4.0000 mg | ORAL_TABLET | Freq: Four times a day (QID) | ORAL | Status: DC | PRN
  Administered 2013-06-16 (×2): 4 mg via ORAL
  Filled 2013-06-13 (×2): qty 1

## 2013-06-13 MED ORDER — ACETAMINOPHEN 325 MG PO TABS
ORAL_TABLET | ORAL | Status: AC
  Administered 2013-06-13: 650 mg via ORAL
  Filled 2013-06-13: qty 2

## 2013-06-13 MED ORDER — WARFARIN SODIUM 5 MG PO TABS
5.0000 mg | ORAL_TABLET | ORAL | Status: AC
  Administered 2013-06-13: 5 mg via ORAL
  Filled 2013-06-13: qty 1

## 2013-06-13 MED ORDER — ONDANSETRON HCL 4 MG/2ML IJ SOLN
INTRAMUSCULAR | Status: AC
  Administered 2013-06-13: 4 mg via INTRAVENOUS
  Filled 2013-06-13: qty 2

## 2013-06-13 MED ORDER — LEVALBUTEROL HCL 0.63 MG/3ML IN NEBU
0.6300 mg | INHALATION_SOLUTION | Freq: Four times a day (QID) | RESPIRATORY_TRACT | Status: DC | PRN
  Administered 2013-06-14 – 2013-06-18 (×6): 0.63 mg via RESPIRATORY_TRACT
  Filled 2013-06-13 (×6): qty 3

## 2013-06-13 MED ORDER — LEVOFLOXACIN IN D5W 500 MG/100ML IV SOLN
500.0000 mg | Freq: Once | INTRAVENOUS | Status: AC
  Administered 2013-06-13: 500 mg via INTRAVENOUS
  Filled 2013-06-13: qty 100

## 2013-06-13 MED ORDER — WARFARIN - PHARMACIST DOSING INPATIENT: Status: DC

## 2013-06-13 MED ORDER — GABAPENTIN 300 MG PO CAPS
300.0000 mg | ORAL_CAPSULE | Freq: Two times a day (BID) | ORAL | Status: DC
  Administered 2013-06-13 – 2013-06-18 (×10): 300 mg via ORAL
  Filled 2013-06-13 (×10): qty 1

## 2013-06-13 MED ORDER — SODIUM CHLORIDE 0.9 % IV BOLUS (SEPSIS): 1000.0000 mL | Freq: Once | INTRAVENOUS | Status: DC

## 2013-06-13 MED ORDER — FUROSEMIDE 40 MG PO TABS: 40.0000 mg | ORAL_TABLET | Freq: Every day | ORAL | Status: DC

## 2013-06-13 MED ORDER — SODIUM CHLORIDE 0.9 % IJ SOLN
3.0000 mL | Freq: Two times a day (BID) | INTRAMUSCULAR | Status: DC
  Administered 2013-06-13 – 2013-06-16 (×5): 3 mL via INTRAVENOUS

## 2013-06-13 NOTE — ED Provider Notes (Signed)
History  This chart was scribed for Glynn Octave, MD by Bennett Scrape, ED Scribe. This patient was seen in room [facility] and the patient's care was started at 2:04 PM.  CSN: [id number]  Arrival date & time 06/13/13  1354   First MD Initiated Contact with Patient 06/13/13 1404      Chief Complaint  Patient presents with  . Shortness of Breath     The history is provided by the patient. No language interpreter was used.    HPI Comments: [name] is a 79 y.o. male with a h/o COPD who presents to the Emergency Department complaining of 5 days of SOB that started after getting a piece of ice stuck in his esophagus. He reports an associated cough that was productive of "something. I didn't look" around 4 AM this morning, fevers and right lateral CP and abdominal pain that occurs only when coughing. When asked if the SOB is worsened in the supine position, pt replies "I don't know. I don't sleep that way". He admits to prior admissions for COPD exacerbation but is unable to remember when the last one occurred. He states that he has been eating and drinking normally since the onset. He reports that he has been having a normal amount of BMs. He denies diaphoresis, trouble swallowing, nausea, dysuria, hematuria and emesis as associated symptoms. He denies having any cardiac stents placed. He also has a h/o A. Fib treated with coumadin, HLD, HTN and chronic back pain. Pt is a former smoker and former alcohol user.   Past Medical History  Diagnosis Date  . COPD (chronic obstructive pulmonary disease)     Uses occasional nighttime O2  . Disseminated herpes zoster 2010  . GERD (gastroesophageal reflux disease)   . Asthma   . Pulmonary embolus     2003 and 2011  . Pulmonary nodules      Chest CT 09/11  . Mixed hyperlipidemia   . Atrial fibrillation     CHADS2 score 2  . Lupus anticoagulant positive   . Hypertension   . Gall stones   . Rheumatoid arthritis(714.0)   . History of  chicken pox 1941; 2011  . Anemia   . Chronic lower back pain   . Edema     Evaluated by Dr. Diona Browner - felt more likely component of cor pulmonale due to chronic lung disease (EF  60-65%, grade 2 diastolic dysfunction by echo 01/2012)  . Cirrhosis     questionable, AFP normal Feb 01/2012, hx of ETOH use     Past Surgical History  Procedure Laterality Date  . Polypectomy  02/02/2012    RMR: Multiple colonic polyps removed, flat, tubular adenomas/ Left-sided diverticulosis  . Posterior lumbar vertebrae excision  2003; 2009; 2011  . Myringotomy      "3 times; both ears"  . Cataract extraction w/ intraocular lens  implant, bilateral    . Cholecystectomy  05/23/2012    Procedure: LAPAROSCOPIC CHOLECYSTECTOMY WITH INTRAOPERATIVE CHOLANGIOGRAM;  Surgeon: Almond Lint, MD;  Location: [facility] OR;  Service: General;  Laterality: N/A;  . Esophagogastroduodenoscopy  02/02/12    WUJ:[id number] size hiatal hernia; otherwise normal exam  . Colonoscopy  01/24/2013    Procedure: COLONOSCOPY;  Surgeon: Corbin Ade, MD;  Location: [facility];  Service: Endoscopy;  Laterality: N/A;  8:30    Family History  Problem Relation Age of Onset  . Colon cancer Neg Hx   . Liver disease Neg Hx     History  Substance Use Topics  . Smoking status: Former Smoker -- 1.00 packs/day for 57 years    Types: Cigarettes    Quit date: 01/27/1998  . Smokeless tobacco: Never Used  . Alcohol Use: No     Comment: "quit drinking 1967"      Review of Systems  A complete 10 system review of systems was obtained and all systems are negative except as noted in the HPI and PMH.   Allergies  Cymbalta and Procaine hcl  Home Medications   Current Outpatient Rx  Name  Route  Sig  Dispense  Refill  . cetirizine (ZYRTEC) 10 MG tablet   Oral   Take 10 mg by mouth daily.           . cloNIDine (CATAPRES) 0.1 MG tablet   Oral   Take 0.1 mg by mouth 2 (two) times daily.           Marland Kitchen diltiazem (CARTIA XT) 240 MG 24 hr  capsule   Oral   Take 1 capsule (240 mg total) by mouth at bedtime.   30 capsule   11   . ferrous gluconate (FERGON) 325 MG tablet   Oral   Take 325 mg by mouth daily with breakfast.         . folic acid (FOLVITE) 1 MG tablet   Oral   Take 1 mg by mouth daily.          . furosemide (LASIX) 40 MG tablet   Oral   Take 40 mg by mouth 2 (two) times daily.          Marland Kitchen gabapentin (NEURONTIN) 300 MG capsule   Oral   Take 300 mg by mouth 2 (two) times daily.          . methotrexate (RHEUMATREX) 2.5 MG tablet   Oral   Take 2.5 mg by mouth once a week. *TAKE 5 TABLETS BY MOUTH FOR A TOTAL 12.5MG  WEEKLY ON FRIDAYS*         . Omega-3 Fatty Acids (FISH OIL) 1000 MG CAPS   Oral   Take 1 capsule by mouth 2 (two) times daily.          Marland Kitchen omeprazole (PRILOSEC) 20 MG capsule   Oral   Take 20 mg by mouth 2 (two) times daily.          Marland Kitchen oxyCODONE-acetaminophen (PERCOCET) 10-325 MG per tablet   Oral   Take 1 tablet by mouth every 6 (six) hours as needed for pain.         . potassium chloride SA (K-DUR,KLOR-CON) 20 MEQ tablet   Oral   Take 20 mEq by mouth 2 (two) times daily.           Marland Kitchen warfarin (COUMADIN) 2 MG tablet   Oral   Take 5-7 mg by mouth every evening. Takes 5mg  daily except takes 7mg  on Wednesdays         . albuterol (PROAIR HFA) 108 (90 BASE) MCG/ACT inhaler   Inhalation   Inhale 2 puffs into the lungs every 6 (six) hours as needed. Shortness of Breath         . albuterol (PROVENTIL) (2.5 MG/3ML) 0.083% nebulizer solution   Nebulization   Take 2.5 mg by nebulization every 6 (six) hours as needed. Shortness of Breath           Triage Vitals: BP 117/80  Pulse 104  Temp(Src) 98.3 F (36.8 C) (Temporal)  Resp 22  Ht 6'  1" (1.854 m)  Wt 228 lb (103.42 kg)  BMI 30.09 kg/m2  SpO2 92%  Physical Exam  Nursing note and vitals reviewed. Constitutional: He is oriented to person, place, and time. He appears well-developed and well-nourished. No  distress.  HENT:  Head: Normocephalic and atraumatic.  Dry MM  Eyes: Conjunctivae and EOM are normal. Pupils are equal, round, and reactive to light.  Neck: Neck supple. No tracheal deviation present.  Cardiovascular: Normal rate.   No murmur heard. Irregular tachycardia  Pulmonary/Chest: Effort normal. No respiratory distress.  Diminished breath sounds bilaterally  Abdominal: He exhibits distension. There is tenderness (diffusely). There is no rebound and no guarding.  Musculoskeletal: Normal range of motion. He exhibits no edema.  Intact peripheral pulses, no peripheral edema  Neurological: He is alert and oriented to person, place, and time. No cranial nerve deficit. He exhibits normal muscle tone. Coordination normal.  5/5 strength in bilateral lower extremities. Ankle plantar and dorsiflexion intact. Great toe extension intact bilaterally. +2 DP and PT pulses. +2 patellar reflexes bilaterally.   Skin: Skin is warm and dry.  Psychiatric: He has a normal mood and affect. His behavior is normal.    ED Course  Procedures (including critical care time)  Medications  diltiazem (CARDIZEM) 100 mg in dextrose 5 % 100 mL infusion (10 mg/hr Intravenous Restarted 06/13/13 1555)  levofloxacin (LEVAQUIN) IVPB 500 mg (500 mg Intravenous New Bag/Given 06/13/13 1835)  cefTRIAXone (ROCEPHIN) 1 g in dextrose 5 % 50 mL IVPB (not administered)  azithromycin (ZITHROMAX) 500 mg in dextrose 5 % 250 mL IVPB (not administered)  sodium chloride 0.9 % bolus 500 mL (not administered)  metoprolol (LOPRESSOR) injection 5 mg (not administered)  0.9 %  sodium chloride infusion (not administered)  ondansetron (ZOFRAN) injection 4 mg (not administered)  sodium chloride 0.9 % bolus 1,000 mL (not administered)  albuterol (PROVENTIL) (5 MG/ML) 0.5% nebulizer solution 2.5 mg (2.5 mg Nebulization Given 06/13/13 1515)  ipratropium (ATROVENT) nebulizer solution 0.5 mg (0.5 mg Nebulization Given 06/13/13 1515)  diltiazem  (CARDIZEM) injection 5 mg (0 mg Intravenous Stopped 06/13/13 1450)  iohexol (OMNIPAQUE) 300 MG/ML solution 50 mL (50 mLs Oral Contrast Given 06/13/13 1508)  metoprolol (LOPRESSOR) injection 5 mg (5 mg Intravenous Given 06/13/13 1549)  furosemide (LASIX) injection 40 mg (40 mg Intravenous Given 06/13/13 1618)  oxyCODONE-acetaminophen (PERCOCET/ROXICET) 5-325 MG per tablet 1 tablet (1 tablet Oral Given 06/13/13 1618)  ondansetron (ZOFRAN) injection 4 mg (4 mg Intravenous Given 06/13/13 1751)  iohexol (OMNIPAQUE) 350 MG/ML injection 100 mL (100 mLs Intravenous Contrast Given 06/13/13 1812)  acetaminophen (TYLENOL) 325 MG tablet (650 mg  Given 06/13/13 1831)    DIAGNOSTIC STUDIES: Oxygen Saturation is 92% on room air, adequate by my interpretation.    COORDINATION OF CARE: 2:18 PM-Discussed treatment plan which includes CXR, CBC panel, CMP, UA with pt at bedside and pt agreed to plan.   2:57 PM-Pt rechecked and is in A. Fib. Will order Cardizem. Pt denies CP currently but still c/o SOB.  Labs Reviewed  URINALYSIS, ROUTINE W REFLEX MICROSCOPIC - Abnormal; Notable for the following:    Hgb urine dipstick TRACE (*)    Protein, ur 30 (*)    All other components within normal limits  CBC WITH DIFFERENTIAL - Abnormal; Notable for the following:    WBC 14.5 (*)    RDW 17.2 (*)    Neutrophils Relative % 79 (*)    Neutro Abs 11.4 (*)    Lymphocytes Relative 9 (*)  Monocytes Absolute 1.5 (*)    All other components within normal limits  COMPREHENSIVE METABOLIC PANEL - Abnormal; Notable for the following:    Glucose, Bld 119 (*)    Total Bilirubin 2.2 (*)    GFR calc non Af Amer 66 (*)    GFR calc Af Amer 77 (*)    All other components within normal limits  PROTIME-INR - Abnormal; Notable for the following:    Prothrombin Time 20.3 (*)    INR 1.81 (*)    All other components within normal limits  PRO B NATRIURETIC PEPTIDE - Abnormal; Notable for the following:    Pro B Natriuretic peptide (BNP)  1637.0 (*)    All other components within normal limits  CULTURE, BLOOD (ROUTINE X 2)  CULTURE, BLOOD (ROUTINE X 2)  LIPASE, BLOOD  LACTIC ACID, PLASMA  TROPONIN I  URINE MICROSCOPIC-ADD ON   Ct Angio Chest Pe W/cm &/or Wo Cm  06/13/2013   *RADIOLOGY REPORT*  Clinical Data:  ABDOMINAL PAIN, CHEST PAIN, SHORTNESS OF BREATH.  CT ANGIOGRAPHY CHEST CT ABDOMEN AND PELVIS WITH CONTRAST  Technique:  Multidetector CT imaging of the chest was performed using the standard protocol during bolus administration of intravenous contrast.  Multiplanar CT image reconstructions including MIPs were obtained to evaluate the vascular anatomy. Multidetector CT imaging of the abdomen and pelvis was performed using the standard protocol during bolus administration of intravenous contrast.  Contrast: 50mL OMNIPAQUE IOHEXOL 300 MG/ML  SOLN, OMNIPAQUE IOHEXOL 350 MG/ML SOLN  Comparison:  05/21/2012  CTA CHEST  Findings:  There is good contrast opacification of the pulmonary artery branches.  No discrete filling defect to suggest acute PE.Breathing during the acquisition degrades some of the images. Adequate contrast opacification of the thoracic aorta with no evidence of dissection, aneurysm, or stenosis. There is classic 3- vessel brachiocephalic arch anatomy.  Coronary and aortic calcifications.  No pleural or pericardial effusion.  New right hilar adenopathy up to   20 mm transverse diameter.  New subcarinal adenopathy up to 14 mm short axis diameter.  Sub centimeter prevascular and AP window  nodes.  12 mm enlarged pretracheal node and  10 mm right paratracheal node on image 21/4. Multiple small subpleural blebs noted in the upper lobes.  9-mm lingular nodule image 49/5 stable since 03/29/2011.  Smaller subpleural nodule is degraded by breathing motion.  6 mm nodule laterally in the right upper lobe image 22/5 is new since prior 03/29/2011.  There are new coarse airspace opacities in the posterior, medial, and lateral  basal segments left lower lobe, medial and posterior basal segments right lower lobe with air bronchograms evident.  Old T4 and T12 compression fracture deformities.  Sternum intact.  Review of the MIP images confirms the above findings.  IMPRESSION: 1.  Negative for acute PE or thoracic aortic dissection. 2.  Airspace infiltrates in both lower lobes, left worse than right, suggesting pneumonia more likely than atypical edema or neoplasm.  3. New right hilar and mediastinal adenopathy is nonspecific, possibly reactive although follow-up recommended for appropriate resolution. 4.  Stable lingular pulmonary nodules.  New 6 mm right upper lobe pulmonary nodule. 5. Atherosclerosis, including . coronary artery disease. Please note that although the presence of coronary artery calcium documents the presence of coronary artery disease, the severity of this disease and any potential stenosis cannot be assessed on this non-gated CT examination.  Assessment for potential risk factor modification, dietary therapy or pharmacologic therapy may be warranted, if clinically indicated.  CT ABDOMEN AND PELVIS  Findings: Stable small hepatic cysts.  No new hepatic lesion. Vascular clips in the gallbladder fossa.  Scattered calcified granulomas in the spleen.  Unremarkable adrenal glands and pancreas.  Stable small bilateral renal cysts.  No hydronephrosis. Aortoiliac arterial calcifications.  Small hiatal hernia.  Stomach, small bowel, and colon are nondilated.  Normal appendix.  Urinary bladder physiologically distended.  Mild prostatic enlargement.  No ascites.  No free air.  No adenopathy.  Portal vein patent. Changes of instrumented PLIF L4-5.  Stable mild compression fracture deformities L1, L2, L3.  Spondylitic changes L3-4 and L5- S1. 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  Review of the MIP images confirms the above findings.  IMPRESSION:  1.  No acute abdominal process. 2.  Small hiatal hernia. 3.  Stable chronic and postoperative  changes as above.   Original Report Authenticated By: D. Andria Rhein, MD   Ct Abdomen Pelvis W Contrast  06/13/2013   *RADIOLOGY REPORT*  Clinical Data:  ABDOMINAL PAIN, CHEST PAIN, SHORTNESS OF BREATH.  CT ANGIOGRAPHY CHEST CT ABDOMEN AND PELVIS WITH CONTRAST  Technique:  Multidetector CT imaging of the chest was performed using the standard protocol during bolus administration of intravenous contrast.  Multiplanar CT image reconstructions including MIPs were obtained to evaluate the vascular anatomy. Multidetector CT imaging of the abdomen and pelvis was performed using the standard protocol during bolus administration of intravenous contrast.  Contrast: 50mL OMNIPAQUE IOHEXOL 300 MG/ML  SOLN, OMNIPAQUE IOHEXOL 350 MG/ML SOLN  Comparison:  05/21/2012  CTA CHEST  Findings:  There is good contrast opacification of the pulmonary artery branches.  No discrete filling defect to suggest acute PE.Breathing during the acquisition degrades some of the images. Adequate contrast opacification of the thoracic aorta with no evidence of dissection, aneurysm, or stenosis. There is classic 3- vessel brachiocephalic arch anatomy.  Coronary and aortic calcifications.  No pleural or pericardial effusion.  New right hilar adenopathy up to   20 mm transverse diameter.  New subcarinal adenopathy up to 14 mm short axis diameter.  Sub centimeter prevascular and AP window  nodes.  12 mm enlarged pretracheal node and  10 mm right paratracheal node on image 21/4. Multiple small subpleural blebs noted in the upper lobes.  9-mm lingular nodule image 49/5 stable since 03/29/2011.  Smaller subpleural nodule is degraded by breathing motion.  6 mm nodule laterally in the right upper lobe image 22/5 is new since prior 03/29/2011.  There are new coarse airspace opacities in the posterior, medial, and lateral basal segments left lower lobe, medial and posterior basal segments right lower lobe with air bronchograms evident.  Old T4 and T12  compression fracture deformities.  Sternum intact.  Review of the MIP images confirms the above findings.  IMPRESSION: 1.  Negative for acute PE or thoracic aortic dissection. 2.  Airspace infiltrates in both lower lobes, left worse than right, suggesting pneumonia more likely than atypical edema or neoplasm.  3. New right hilar and mediastinal adenopathy is nonspecific, possibly reactive although follow-up recommended for appropriate resolution. 4.  Stable lingular pulmonary nodules.  New 6 mm right upper lobe pulmonary nodule. 5. Atherosclerosis, including . coronary artery disease. Please note that although the presence of coronary artery calcium documents the presence of coronary artery disease, the severity of this disease and any potential stenosis cannot be assessed on this non-gated CT examination.  Assessment for potential risk factor modification, dietary therapy or pharmacologic therapy may be warranted, if clinically indicated.  CT ABDOMEN AND PELVIS  Findings: Stable small hepatic cysts.  No new hepatic lesion. Vascular clips in the gallbladder fossa.  Scattered calcified granulomas in the spleen.  Unremarkable adrenal glands and pancreas.  Stable small bilateral renal cysts.  No hydronephrosis. Aortoiliac arterial calcifications.  Small hiatal hernia.  Stomach, small bowel, and colon are nondilated.  Normal appendix.  Urinary bladder physiologically distended.  Mild prostatic enlargement.  No ascites.  No free air.  No adenopathy.  Portal vein patent. Changes of instrumented PLIF L4-5.  Stable mild compression fracture deformities L1, L2, L3.  Spondylitic changes L3-4 and L5- S1. 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  Review of the MIP images confirms the above findings.  IMPRESSION:  1.  No acute abdominal process. 2.  Small hiatal hernia. 3.  Stable chronic and postoperative changes as above.   Original Report Authenticated By: D. Andria Rhein, MD   Dg Chest Port 1 View  06/13/2013   *RADIOLOGY REPORT*   Clinical Data: Short of breath  PORTABLE CHEST - 1 VIEW  Comparison: Prior chest x-ray 10/12/2012  Findings: Pulmonary vascular congestion with mild interstitial edema.  Patchy bibasilar opacities appears similar to prior and likely reflects chronic changes and atelectasis.  Stable cardiomegaly.  Atherosclerotic calcifications noted in the transverse aorta.  There may be small bilateral pleural effusions. Stable granulomatous nodule in the lingula.  IMPRESSION:  1. Pulmonary vascular congestion with diffuse bilateral interstitial and airspace opacities most consistent with mild pulmonary edema.  Atypical infection could have a similar appearance. 2.  Stable cardiomegaly. 3.  Stable granulomatous nodules in the lingula. 4.  Background COPD and emphysema.   Original Report Authenticated By: Malachy Moan, M.D.     1. CAP (community acquired pneumonia)   2. Atrial fibrillation with RVR       MDM  Shortness of breath with cough, right-sided chest pain and flank pain diffuse abdominal pain. Patient states started after he "got a piece of ice stuck" in his windpipe several days ago.  Patient arrives in atrial fibrillation with RVR. His coarse breath sounds bilaterally but is in no distress. He is mildly hypoxic at 90%.  Chest x-ray shows diffuse interstitial congestion and bibasilar airspace opacities.   Patient is patient given IV fluids. He is started on Cardizem bolus and drip for his atrial fibrillation. Is given antibiotics for presumed pneumonia community-acquired. He has not been Hospitalized since last October. INR is subtherapeutic so we'll pursue CT imaging of the chest to rule out PE. No PE but bilateral pneumonia is seen. Abdominal CT negative for acute pathology.  Remains tachycardic in the 120s despite Cardizem. Is given IV Lopressor additional fluids. Drip is titrated to 15 mg per hour. Oxygenation improved to mid 90s on [facility] O2. Admission to step down d/w Dr. Rito Ehrlich.   Date:  06/13/2013  Rate: 144  Rhythm: atrial fibrillation  QRS Axis: normal  Intervals: normal  ST/T Wave abnormalities: nonspecific ST/T changes  Conduction Disutrbances:none  Narrative Interpretation: rate faster  Old EKG Reviewed: changes noted   CRITICAL CARE Performed by: Glynn Octave Total critical care time: 30 Critical care time was exclusive of separately billable procedures and treating other patients. Critical care was necessary to treat or prevent imminent or life-threatening deterioration. Critical care was time spent personally by me on the following activities: development of treatment plan with patient and/or surrogate as well as nursing, discussions with consultants, evaluation of patient's response to treatment, examination of patient, obtaining history from patient or surrogate,  ordering and performing treatments and interventions, ordering and review of laboratory studies, ordering and review of radiographic studies, pulse oximetry and re-evaluation of patient's condition.   I personally performed the services described in this documentation, which was scribed in my presence. The recorded information has been reviewed and is accurate.    Glynn Octave, MD 06/13/13 2140

## 2013-06-13 NOTE — H&P (Addendum)
[facility] History and Physical  [name] EAV:[id number] DOB: Jun 13, 1931 DOA: 06/13/2013   PCP: Josue Hector, MD  Specialists: Is followed by [facility] cardiology for his atrial fibrillation  Chief Complaint: Shortness of breath and cough for few days  HPI: [name] is a 79 y.o. male with a past medical history of atrial fibrillation, COPD, obesity, who was in his usual state of health a few days ago, when he says that he was sucking on ice cube when he choked on it. He felt as it was stuck in his air pipe. Subsequently started feeling short of breath, and this has been worsening over the last few days. He's had a dry cough. He has had difficulty sleeping. He denies any fever or chills at home. He has had sharp chest pains bilaterally 8/10 in intensity without any radiation. The pain worsens with deep breathing and cough. No real relieving factors. Denies any sick contacts. Denies any recent travel. He did vomit once in the ED. Has been having some dizziness or lightheadedness. Has been having some wheezing as well.  Home Medications: Prior to Admission medications   Medication Sig Start Date End Date Taking? Authorizing Provider  cetirizine (ZYRTEC) 10 MG tablet Take 10 mg by mouth daily.     Yes Historical Provider, MD  cloNIDine (CATAPRES) 0.1 MG tablet Take 0.1 mg by mouth 2 (two) times daily.     Yes Historical Provider, MD  diltiazem (CARTIA XT) 240 MG 24 hr capsule Take 1 capsule (240 mg total) by mouth at bedtime. 11/02/12  Yes Jodelle Gross, NP  ferrous gluconate (FERGON) 325 MG tablet Take 325 mg by mouth daily with breakfast.   Yes Historical Provider, MD  folic acid (FOLVITE) 1 MG tablet Take 1 mg by mouth daily.    Yes Historical Provider, MD  furosemide (LASIX) 40 MG tablet Take 40 mg by mouth 2 (two) times daily.    Yes Historical Provider, MD  gabapentin (NEURONTIN) 300 MG capsule Take 300 mg by mouth 2 (two) times daily.    Yes Historical Provider, MD   methotrexate (RHEUMATREX) 2.5 MG tablet Take 2.5 mg by mouth once a week. *TAKE 5 TABLETS BY MOUTH FOR A TOTAL 12.5MG  WEEKLY ON FRIDAYS*   Yes Historical Provider, MD  Omega-3 Fatty Acids (FISH OIL) 1000 MG CAPS Take 1 capsule by mouth 2 (two) times daily.    Yes Historical Provider, MD  omeprazole (PRILOSEC) 20 MG capsule Take 20 mg by mouth 2 (two) times daily.    Yes Historical Provider, MD  oxyCODONE-acetaminophen (PERCOCET) 10-325 MG per tablet Take 1 tablet by mouth every 6 (six) hours as needed for pain. 05/25/12  Yes Clydia Llano, MD  potassium chloride SA (K-DUR,KLOR-CON) 20 MEQ tablet Take 20 mEq by mouth 2 (two) times daily.     Yes Historical Provider, MD  warfarin (COUMADIN) 2 MG tablet Take 5-7 mg by mouth every evening. Takes 5mg  daily except takes 7mg  on Wednesdays   Yes Historical Provider, MD  albuterol (PROAIR HFA) 108 (90 BASE) MCG/ACT inhaler Inhale 2 puffs into the lungs every 6 (six) hours as needed. Shortness of Breath    Historical Provider, MD  albuterol (PROVENTIL) (2.5 MG/3ML) 0.083% nebulizer solution Take 2.5 mg by nebulization every 6 (six) hours as needed. Shortness of Breath    Historical Provider, MD    Allergies:  Allergies  Allergen Reactions  . Cymbalta (Duloxetine Hcl) Other (See Comments)    Confusion   . Procaine  Hcl Hives and Other (See Comments)    NOVOCAINE: Sweating, Confusion, Not in right state of mind.    Past Medical History: Past Medical History  Diagnosis Date  . COPD (chronic obstructive pulmonary disease)     Uses occasional nighttime O2  . Disseminated herpes zoster 2010  . GERD (gastroesophageal reflux disease)   . Asthma   . Pulmonary embolus     2003 and 2011  . Pulmonary nodules      Chest CT 09/11  . Mixed hyperlipidemia   . Atrial fibrillation     CHADS2 score 2  . Lupus anticoagulant positive   . Hypertension   . Gall stones   . Rheumatoid arthritis(714.0)   . History of chicken pox 1941; 2011  . Anemia   . Chronic  lower back pain   . Edema     Evaluated by Dr. Diona Browner - felt more likely component of cor pulmonale due to chronic lung disease (EF  60-65%, grade 2 diastolic dysfunction by echo 01/2012)  . Cirrhosis     questionable, AFP normal Feb 01/2012, hx of ETOH use     Past Surgical History  Procedure Laterality Date  . Polypectomy  02/02/2012    RMR: Multiple colonic polyps removed, flat, tubular adenomas/ Left-sided diverticulosis  . Posterior lumbar vertebrae excision  2003; 2009; 2011  . Myringotomy      "3 times; both ears"  . Cataract extraction w/ intraocular lens  implant, bilateral    . Cholecystectomy  05/23/2012    Procedure: LAPAROSCOPIC CHOLECYSTECTOMY WITH INTRAOPERATIVE CHOLANGIOGRAM;  Surgeon: Almond Lint, MD;  Location: [facility] OR;  Service: General;  Laterality: N/A;  . Esophagogastroduodenoscopy  02/02/12    [id number] size hiatal hernia; otherwise normal exam  . Colonoscopy  01/24/2013    Procedure: COLONOSCOPY;  Surgeon: Corbin Ade, MD;  Location: [facility];  Service: Endoscopy;  Laterality: N/A;  8:30    Social History:  reports that he quit smoking about 15 years ago. His smoking use included Cigarettes. He has a 57 pack-year smoking history. He has never used smokeless tobacco. He reports that he does not drink alcohol or use illicit drugs.  Living Situation: He lives with a son in [place] Activity Level: Usually independent with daily activities. Sometimes uses a cane when he goes outside.    Family History: Unable to give a medical history in the family  Review of Systems - History obtained from the patient General ROS: positive for  - fatigue Psychological ROS: negative Ophthalmic ROS: negative ENT ROS: negative Allergy and Immunology ROS: negative Hematological and Lymphatic ROS: negative Endocrine ROS: negative Respiratory ROS: as in hpi Cardiovascular ROS: as in hpi Gastrointestinal ROS: Occasional loose stools since yesterday Genito-Urinary  ROS: no dysuria, trouble voiding, or hematuria Musculoskeletal ROS: negative Neurological ROS: no TIA or stroke symptoms Dermatological ROS: negative  Physical Examination  Filed Vitals:   06/13/13 1746 06/13/13 1831 06/13/13 1956 06/13/13 2006  BP: 131/68 113/86 114/59   Pulse: 131 124 107   Temp:  99.7 F (37.6 C)  99.9 F (37.7 C)  TempSrc:  Oral  Oral  Resp: 28 32 22   Height:      Weight:      SpO2: 95% 90% 92%     General appearance: alert, cooperative, appears stated age and morbidly obese Head: Normocephalic, without obvious abnormality, atraumatic Eyes: conjunctivae/corneas clear. PERRL, EOM's intact.  Neck: no adenopathy, no carotid bruit, no JVD, supple, symmetrical, trachea midline and  thyroid not enlarged, symmetric, no tenderness/mass/nodules Back: symmetric, no curvature. ROM normal. No CVA tenderness. Resp: Coarse breath sounds with crackles at bases bilaterally. No wheezing is appreciated Cardio: S1-S2, tachycardic. Irregularly irregular. No murmurs appreciated. No pedal edema GI: soft, non-tender; bowel sounds normal; no masses,  no organomegaly Extremities: extremities normal, atraumatic, no cyanosis or edema Pulses: 2+ and symmetric Skin: Skin color, texture, turgor normal. No rashes or lesions Neurologic: He is alert and oriented x3. No focal neurological deficits are present.  Laboratory Data: Results for orders placed during the hospital encounter of 06/13/13 (from the past 48 hour(s))  CBC WITH DIFFERENTIAL     Status: Abnormal   Collection Time    06/13/13  2:28 PM      Result Value Range   WBC 14.5 (*) 4.0 - 10.5 K/uL   RBC 4.35  4.22 - 5.81 MIL/uL   Hemoglobin 13.6  13.0 - 17.0 g/dL   HCT 16.1  09.6 - 04.5 %   MCV 96.3  78.0 - 100.0 fL   MCH 31.3  26.0 - 34.0 pg   MCHC 32.5  30.0 - 36.0 g/dL   RDW 40.9 (*) 81.1 - 91.4 %   Platelets 169  150 - 400 K/uL   Neutrophils Relative % 79 (*) 43 - 77 %   Neutro Abs 11.4 (*) 1.7 - 7.7 K/uL    Lymphocytes Relative 9 (*) 12 - 46 %   Lymphs Abs 1.3  0.7 - 4.0 K/uL   Monocytes Relative 10  3 - 12 %   Monocytes Absolute 1.5 (*) 0.1 - 1.0 K/uL   Eosinophils Relative 2  0 - 5 %   Eosinophils Absolute 0.3  0.0 - 0.7 K/uL   Basophils Relative 0  0 - 1 %   Basophils Absolute 0.0  0.0 - 0.1 K/uL  COMPREHENSIVE METABOLIC PANEL     Status: Abnormal   Collection Time    06/13/13  2:28 PM      Result Value Range   Sodium 140  135 - 145 mEq/L   Potassium 3.8  3.5 - 5.1 mEq/L   Chloride 104  96 - 112 mEq/L   CO2 28  19 - 32 mEq/L   Glucose, Bld 119 (*) 70 - 99 mg/dL   BUN 12  6 - 23 mg/dL   Creatinine, Ser 7.82  0.50 - 1.35 mg/dL   Calcium 9.4  8.4 - 95.6 mg/dL   Total Protein 7.9  6.0 - 8.3 g/dL   Albumin 3.6  3.5 - 5.2 g/dL   AST 17  0 - 37 U/L   ALT 10  0 - 53 U/L   Alkaline Phosphatase 58  39 - 117 U/L   Total Bilirubin 2.2 (*) 0.3 - 1.2 mg/dL   GFR calc non Af Amer 66 (*) >90 mL/min   GFR calc Af Amer 77 (*) >90 mL/min   Comment:            The eGFR has been calculated     using the CKD EPI equation.     This calculation has not been     validated in all clinical     situations.     eGFR's persistently     <90 mL/min signify     possible Chronic Kidney Disease.  LIPASE, BLOOD     Status: None   Collection Time    06/13/13  2:28 PM      Result Value Range   Lipase 21  11 - 59 U/L  TROPONIN I     Status: None   Collection Time    06/13/13  2:28 PM      Result Value Range   Troponin I <0.30  <0.30 ng/mL   Comment:            Due to the release kinetics of cTnI,     a negative result within the first hours     of the onset of symptoms does not rule out     myocardial infarction with certainty.     If myocardial infarction is still suspected,     repeat the test at appropriate intervals.  PROTIME-INR     Status: Abnormal   Collection Time    06/13/13  2:28 PM      Result Value Range   Prothrombin Time 20.3 (*) 11.6 - 15.2 seconds   INR 1.81 (*) 0.00 - 1.49  PRO  B NATRIURETIC PEPTIDE     Status: Abnormal   Collection Time    06/13/13  2:28 PM      Result Value Range   Pro B Natriuretic peptide (BNP) 1637.0 (*) 0 - 450 pg/mL  LACTIC ACID, PLASMA     Status: None   Collection Time    06/13/13  2:30 PM      Result Value Range   Lactic Acid, Venous 1.1  0.5 - 2.2 mmol/L  URINALYSIS, ROUTINE W REFLEX MICROSCOPIC     Status: Abnormal   Collection Time    06/13/13  4:45 PM      Result Value Range   Color, Urine YELLOW  YELLOW   APPearance CLEAR  CLEAR   Specific Gravity, Urine 1.015  1.005 - 1.030   pH 7.0  5.0 - 8.0   Glucose, UA NEGATIVE  NEGATIVE mg/dL   Hgb urine dipstick TRACE (*) NEGATIVE   Bilirubin Urine NEGATIVE  NEGATIVE   Ketones, ur NEGATIVE  NEGATIVE mg/dL   Protein, ur 30 (*) NEGATIVE mg/dL   Urobilinogen, UA 1.0  0.0 - 1.0 mg/dL   Nitrite NEGATIVE  NEGATIVE   Leukocytes, UA NEGATIVE  NEGATIVE  URINE MICROSCOPIC-ADD ON     Status: None   Collection Time    06/13/13  4:45 PM      Result Value Range   Squamous Epithelial / LPF RARE  RARE   WBC, UA 0-2  <3 WBC/hpf   RBC / HPF 0-2  <3 RBC/hpf   Bacteria, UA RARE  RARE    Radiology Reports: Ct Angio Chest Pe W/cm &/or Wo Cm  06/13/2013   *RADIOLOGY REPORT*  Clinical Data:  ABDOMINAL PAIN, CHEST PAIN, SHORTNESS OF BREATH.  CT ANGIOGRAPHY CHEST CT ABDOMEN AND PELVIS WITH CONTRAST  Technique:  Multidetector CT imaging of the chest was performed using the standard protocol during bolus administration of intravenous contrast.  Multiplanar CT image reconstructions including MIPs were obtained to evaluate the vascular anatomy. Multidetector CT imaging of the abdomen and pelvis was performed using the standard protocol during bolus administration of intravenous contrast.  Contrast: 50mL OMNIPAQUE IOHEXOL 300 MG/ML  SOLN, OMNIPAQUE IOHEXOL 350 MG/ML SOLN  Comparison:  05/21/2012  CTA CHEST  Findings:  There is good contrast opacification of the pulmonary artery branches.  No discrete  filling defect to suggest acute PE.Breathing during the acquisition degrades some of the images. Adequate contrast opacification of the thoracic aorta with no evidence of dissection, aneurysm, or stenosis. There is classic 3- vessel brachiocephalic arch anatomy.  Coronary and aortic calcifications.  No pleural or pericardial effusion.  New right hilar adenopathy up to   20 mm transverse diameter.  New subcarinal adenopathy up to 14 mm short axis diameter.  Sub centimeter prevascular and AP window  nodes.  12 mm enlarged pretracheal node and  10 mm right paratracheal node on image 21/4. Multiple small subpleural blebs noted in the upper lobes.  9-mm lingular nodule image 49/5 stable since 03/29/2011.  Smaller subpleural nodule is degraded by breathing motion.  6 mm nodule laterally in the right upper lobe image 22/5 is new since prior 03/29/2011.  There are new coarse airspace opacities in the posterior, medial, and lateral basal segments left lower lobe, medial and posterior basal segments right lower lobe with air bronchograms evident.  Old T4 and T12 compression fracture deformities.  Sternum intact.  Review of the MIP images confirms the above findings.  IMPRESSION: 1.  Negative for acute PE or thoracic aortic dissection. 2.  Airspace infiltrates in both lower lobes, left worse than right, suggesting pneumonia more likely than atypical edema or neoplasm.  3. New right hilar and mediastinal adenopathy is nonspecific, possibly reactive although follow-up recommended for appropriate resolution. 4.  Stable lingular pulmonary nodules.  New 6 mm right upper lobe pulmonary nodule. 5. Atherosclerosis, including . coronary artery disease. Please note that although the presence of coronary artery calcium documents the presence of coronary artery disease, the severity of this disease and any potential stenosis cannot be assessed on this non-gated CT examination.  Assessment for potential risk factor modification, dietary  therapy or pharmacologic therapy may be warranted, if clinically indicated.  CT ABDOMEN AND PELVIS  Findings: Stable small hepatic cysts.  No new hepatic lesion. Vascular clips in the gallbladder fossa.  Scattered calcified granulomas in the spleen.  Unremarkable adrenal glands and pancreas.  Stable small bilateral renal cysts.  No hydronephrosis. Aortoiliac arterial calcifications.  Small hiatal hernia.  Stomach, small bowel, and colon are nondilated.  Normal appendix.  Urinary bladder physiologically distended.  Mild prostatic enlargement.  No ascites.  No free air.  No adenopathy.  Portal vein patent. Changes of instrumented PLIF L4-5.  Stable mild compression fracture deformities L1, L2, L3.  Spondylitic changes L3-4 and L5- S1. 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  Review of the MIP images confirms the above findings.  IMPRESSION:  1.  No acute abdominal process. 2.  Small hiatal hernia. 3.  Stable chronic and postoperative changes as above.   Original Report Authenticated By: D. Andria Rhein, MD   Ct Abdomen Pelvis W Contrast  06/13/2013   *RADIOLOGY REPORT*  Clinical Data:  ABDOMINAL PAIN, CHEST PAIN, SHORTNESS OF BREATH.  CT ANGIOGRAPHY CHEST CT ABDOMEN AND PELVIS WITH CONTRAST  Technique:  Multidetector CT imaging of the chest was performed using the standard protocol during bolus administration of intravenous contrast.  Multiplanar CT image reconstructions including MIPs were obtained to evaluate the vascular anatomy. Multidetector CT imaging of the abdomen and pelvis was performed using the standard protocol during bolus administration of intravenous contrast.  Contrast: 50mL OMNIPAQUE IOHEXOL 300 MG/ML  SOLN, OMNIPAQUE IOHEXOL 350 MG/ML SOLN  Comparison:  05/21/2012  CTA CHEST  Findings:  There is good contrast opacification of the pulmonary artery branches.  No discrete filling defect to suggest acute PE.Breathing during the acquisition degrades some of the images. Adequate contrast opacification of  the thoracic aorta with no evidence of dissection, aneurysm, or stenosis. There is classic 3- vessel brachiocephalic arch anatomy.  Coronary and aortic calcifications.  No pleural or pericardial effusion.  New right hilar adenopathy up to   20 mm transverse diameter.  New subcarinal adenopathy up to 14 mm short axis diameter.  Sub centimeter prevascular and AP window  nodes.  12 mm enlarged pretracheal node and  10 mm right paratracheal node on image 21/4. Multiple small subpleural blebs noted in the upper lobes.  9-mm lingular nodule image 49/5 stable since 03/29/2011.  Smaller subpleural nodule is degraded by breathing motion.  6 mm nodule laterally in the right upper lobe image 22/5 is new since prior 03/29/2011.  There are new coarse airspace opacities in the posterior, medial, and lateral basal segments left lower lobe, medial and posterior basal segments right lower lobe with air bronchograms evident.  Old T4 and T12 compression fracture deformities.  Sternum intact.  Review of the MIP images confirms the above findings.  IMPRESSION: 1.  Negative for acute PE or thoracic aortic dissection. 2.  Airspace infiltrates in both lower lobes, left worse than right, suggesting pneumonia more likely than atypical edema or neoplasm.  3. New right hilar and mediastinal adenopathy is nonspecific, possibly reactive although follow-up recommended for appropriate resolution. 4.  Stable lingular pulmonary nodules.  New 6 mm right upper lobe pulmonary nodule. 5. Atherosclerosis, including . coronary artery disease. Please note that although the presence of coronary artery calcium documents the presence of coronary artery disease, the severity of this disease and any potential stenosis cannot be assessed on this non-gated CT examination.  Assessment for potential risk factor modification, dietary therapy or pharmacologic therapy may be warranted, if clinically indicated.  CT ABDOMEN AND PELVIS  Findings: Stable small hepatic  cysts.  No new hepatic lesion. Vascular clips in the gallbladder fossa.  Scattered calcified granulomas in the spleen.  Unremarkable adrenal glands and pancreas.  Stable small bilateral renal cysts.  No hydronephrosis. Aortoiliac arterial calcifications.  Small hiatal hernia.  Stomach, small bowel, and colon are nondilated.  Normal appendix.  Urinary bladder physiologically distended.  Mild prostatic enlargement.  No ascites.  No free air.  No adenopathy.  Portal vein patent. Changes of instrumented PLIF L4-5.  Stable mild compression fracture deformities L1, L2, L3.  Spondylitic changes L3-4 and L5- S1. 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  Review of the MIP images confirms the above findings.  IMPRESSION:  1.  No acute abdominal process. 2.  Small hiatal hernia. 3.  Stable chronic and postoperative changes as above.   Original Report Authenticated By: D. Andria Rhein, MD   Dg Chest Port 1 View  06/13/2013   *RADIOLOGY REPORT*  Clinical Data: Short of breath  PORTABLE CHEST - 1 VIEW  Comparison: Prior chest x-ray 10/12/2012  Findings: Pulmonary vascular congestion with mild interstitial edema.  Patchy bibasilar opacities appears similar to prior and likely reflects chronic changes and atelectasis.  Stable cardiomegaly.  Atherosclerotic calcifications noted in the transverse aorta.  There may be small bilateral pleural effusions. Stable granulomatous nodule in the lingula.  IMPRESSION:  1. Pulmonary vascular congestion with diffuse bilateral interstitial and airspace opacities most consistent with mild pulmonary edema.  Atypical infection could have a similar appearance. 2.  Stable cardiomegaly. 3.  Stable granulomatous nodules in the lingula. 4.  Background COPD and emphysema.   Original Report Authenticated By: Malachy Moan, M.D.    Electrocardiogram: Atrial fibrillation at 144 beats per minute. Left axis deviation is noted. Q waves in the inferior leads. Nonspecific T-wave changes are noted. Nonspecific ST  changes noted, which is probably related to the tachycardia.  Problem List  Principal Problem:   CAP (community acquired pneumonia) Active Problems:   Atrial fibrillation with RVR   COPD (chronic obstructive pulmonary disease)   Long term current use of anticoagulant   Obesity (BMI 35.0-39.9 without comorbidity)   Hypoxemia   Assessment: This is a 79 year old, Caucasian male, who is morbidly obese, who comes in with a few day history of shortness of breath and cough. He's had multiple imaging studies that suggest bilateral pneumonia more so in the left lung. This is community-acquired pneumonia. He also has A. fib with RVR.  Plan: #1 community-acquired pneumonia with acute respiratory failure and hypoxia: Continue with ceftriaxone and azithromycin. Utilize oxygen. He'll be monitored in the step down unit. Urine for strep and Legionella antigens will be sent off.  #2 atrial fibrillation with rapid ventricular response: This is most likely due to his respiratory illness. He is currently on a Cardizem infusion, which will be continued. We'll also give him beta blocker intravenously. Utilize metoprolol, as he's not really wheezing. Heart rate should get better controlled once his respiratory status improves. We may need to utilize digoxin or amiodarone in the short-term. Check a TSH level. Echocardiogram was done in February of 2013. There is no urgent need to repeat the same.  #3 On long-term anticoagulation due to A. fib: Pharmacy will assist with warfarin dosage. His INR is currently subtherapeutic.  #4 history of diastolic dysfunction: This is grade 2 per echocardiogram from last year. Continue to monitor his volume status closely. Continue with Lasix at lower dose for now.  #5 history of hypertension: Continue with his antihypertensive regimen. This will be resumed with holding parameters.  DVT Prophylaxis: He is on warfarin Code Status: He is a full code Family Communication: Discussed  with the patient and his grandson  Disposition Plan: Admit to step down.   Further management decisions will depend on results of further testing and patient's response to treatment.  [facility]  [facility] Pager [phone]  If 7PM-7AM, please contact night-coverage [email or web address] Password [facility]  06/13/2013, 8:07 PM   RN called for low BP. Patient asymptomatic. Cardizem was stopped. Patient was given NS bolus and BP improved. HR remained in 80's. BP subsequently dropped again. Will give another bolus. Stop all BP lowering agents. Consult cardiology as HR is rising. May have to consider Digoxin if BP remains low and HR continues to rise.  Athalia Setterlund 6:17 AM

## 2013-06-13 NOTE — ED Notes (Signed)
States he got a piece of ice stuck in his windpipe a few days ago and he has been short of breath ever since

## 2013-06-13 NOTE — ED Notes (Signed)
cardizem up from 10mg /hr to 15mg /hr. HR has been 110-135.

## 2013-06-13 NOTE — Progress Notes (Signed)
ANTICOAGULATION CONSULT NOTE - Initial Consult  Pharmacy Consult for Warfarin Indication: atrial fibrillation  Allergies  Allergen Reactions  . Cymbalta (Duloxetine Hcl) Other (See Comments)    Confusion   . Procaine Hcl Hives and Other (See Comments)    NOVOCAINE: Sweating, Confusion, Not in right state of mind.    Patient Measurements: Height: 6\' 1"  (185.4 cm) Weight: 223 lb 12.3 oz (101.5 kg) IBW/kg (Calculated) : 79.9  Vital Signs: Temp: 99 F (37.2 C) (06/19 2058) Temp src: Oral (06/19 2058) BP: 103/44 mmHg (06/19 2215) Pulse Rate: 106 (06/19 2215)  Labs:  Recent Labs  06/13/13 1428  HGB 13.6  HCT 41.9  PLT 169  LABPROT 20.3*  INR 1.81*  CREATININE 1.04  TROPONINI <0.30    Estimated Creatinine Clearance: 72.1 ml/min (by C-G formula based on Cr of 1.04).   Medical History: Past Medical History  Diagnosis Date  . COPD (chronic obstructive pulmonary disease)     Uses occasional nighttime O2  . Disseminated herpes zoster 2010  . GERD (gastroesophageal reflux disease)   . Asthma   . Pulmonary embolus     2003 and 2011  . Pulmonary nodules      Chest CT 09/11  . Mixed hyperlipidemia   . Atrial fibrillation     CHADS2 score 2  . Lupus anticoagulant positive   . Hypertension   . Gall stones   . Rheumatoid arthritis(714.0)   . History of chicken pox 1941; 2011  . Anemia   . Chronic lower back pain   . Edema     Evaluated by Dr. Diona Browner - felt more likely component of cor pulmonale due to chronic lung disease (EF  60-65%, grade 2 diastolic dysfunction by echo 01/2012)  . Cirrhosis     questionable, AFP normal Feb 01/2012, hx of ETOH use     Medications:  Prescriptions prior to admission  Medication Sig Dispense Refill  . cetirizine (ZYRTEC) 10 MG tablet Take 10 mg by mouth daily.        . cloNIDine (CATAPRES) 0.1 MG tablet Take 0.1 mg by mouth 2 (two) times daily.        Marland Kitchen diltiazem (CARTIA XT) 240 MG 24 hr capsule Take 1 capsule (240 mg total) by  mouth at bedtime.  30 capsule  11  . ferrous gluconate (FERGON) 325 MG tablet Take 325 mg by mouth daily with breakfast.      . folic acid (FOLVITE) 1 MG tablet Take 1 mg by mouth daily.       . furosemide (LASIX) 40 MG tablet Take 40 mg by mouth 2 (two) times daily.       Marland Kitchen gabapentin (NEURONTIN) 300 MG capsule Take 300 mg by mouth 2 (two) times daily.       . methotrexate (RHEUMATREX) 2.5 MG tablet Take 2.5 mg by mouth once a week. *TAKE 5 TABLETS BY MOUTH FOR A TOTAL 12.5MG  WEEKLY ON FRIDAYS*      . Omega-3 Fatty Acids (FISH OIL) 1000 MG CAPS Take 1 capsule by mouth 2 (two) times daily.       Marland Kitchen omeprazole (PRILOSEC) 20 MG capsule Take 20 mg by mouth 2 (two) times daily.       Marland Kitchen oxyCODONE-acetaminophen (PERCOCET) 10-325 MG per tablet Take 1 tablet by mouth every 6 (six) hours as needed for pain.      . potassium chloride SA (K-DUR,KLOR-CON) 20 MEQ tablet Take 20 mEq by mouth 2 (two) times daily.        Marland Kitchen  warfarin (COUMADIN) 2 MG tablet Take 5-7 mg by mouth every evening. Takes 5mg  daily except takes 7mg  on Wednesdays      . albuterol (PROAIR HFA) 108 (90 BASE) MCG/ACT inhaler Inhale 2 puffs into the lungs every 6 (six) hours as needed. Shortness of Breath      . albuterol (PROVENTIL) (2.5 MG/3ML) 0.083% nebulizer solution Take 2.5 mg by nebulization every 6 (six) hours as needed. Shortness of Breath        Assessment: Okay for Protocol INR below goal, last dose 6/18 per PTA med list.  Goal of Therapy:  INR 2-3   Plan:  Warfarin 5mg  PO x 1 Daily PT/INR  Mady Gemma 06/13/2013,10:28 PM

## 2013-06-14 ENCOUNTER — Encounter (HOSPITAL_COMMUNITY): Payer: Medicare Other

## 2013-06-14 ENCOUNTER — Inpatient Hospital Stay (HOSPITAL_COMMUNITY): Payer: Medicare Other

## 2013-06-14 DIAGNOSIS — N179 Acute kidney failure, unspecified: Secondary | ICD-10-CM | POA: Diagnosis present

## 2013-06-14 DIAGNOSIS — J9600 Acute respiratory failure, unspecified whether with hypoxia or hypercapnia: Secondary | ICD-10-CM | POA: Diagnosis present

## 2013-06-14 DIAGNOSIS — A419 Sepsis, unspecified organism: Secondary | ICD-10-CM | POA: Diagnosis present

## 2013-06-14 LAB — COMPREHENSIVE METABOLIC PANEL
ALT: 8 U/L (ref 0–53)
Alkaline Phosphatase: 59 U/L (ref 39–117)
CO2: 28 mEq/L (ref 19–32)
Calcium: 8.6 mg/dL (ref 8.4–10.5)
GFR calc Af Amer: 49 mL/min — ABNORMAL LOW (ref >90)
GFR calc non Af Amer: 43 mL/min — ABNORMAL LOW (ref >90)
Glucose, Bld: 85 mg/dL (ref 70–99)
Sodium: 139 mEq/L (ref 135–145)
Total Bilirubin: 2 mg/dL — ABNORMAL HIGH (ref 0.3–1.2)

## 2013-06-14 LAB — PROTIME-INR
INR: 2.09 — ABNORMAL HIGH (ref 0.00–1.49)
INR: 2.14 — ABNORMAL HIGH (ref 0.00–1.49)
Prothrombin Time: 22.6 seconds — ABNORMAL HIGH (ref 11.6–15.2)
Prothrombin Time: 23 seconds — ABNORMAL HIGH (ref 11.6–15.2)

## 2013-06-14 LAB — APTT: aPTT: 50 seconds — ABNORMAL HIGH (ref 24–37)

## 2013-06-14 LAB — FIBRINOGEN: Fibrinogen: 585 mg/dL — ABNORMAL HIGH (ref 204–475)

## 2013-06-14 LAB — CBC
Hemoglobin: 12.5 g/dL — ABNORMAL LOW (ref 13.0–17.0)
MCHC: 32.1 g/dL (ref 30.0–36.0)
Platelets: 165 10*3/uL (ref 150–400)
RDW: 17.4 % — ABNORMAL HIGH (ref 11.5–15.5)

## 2013-06-14 LAB — TYPE AND SCREEN

## 2013-06-14 LAB — HIV ANTIBODY (ROUTINE TESTING W REFLEX): HIV: NONREACTIVE

## 2013-06-14 LAB — TROPONIN I: Troponin I: <0.3 ng/mL (ref <0.30)

## 2013-06-14 MED ORDER — SODIUM CHLORIDE 0.9 % IV BOLUS (SEPSIS)
500.0000 mL | Freq: Once | INTRAVENOUS | Status: AC
  Administered 2013-06-14: 500 mL via INTRAVENOUS

## 2013-06-14 MED ORDER — AMIODARONE HCL IN DEXTROSE 360-4.14 MG/200ML-% IV SOLN
INTRAVENOUS | Status: AC
  Administered 2013-06-14: 200 mL via INTRAVENOUS
  Filled 2013-06-14: qty 200

## 2013-06-14 MED ORDER — SODIUM CHLORIDE 0.9 % IJ SOLN: 10.0000 mL | INTRAMUSCULAR | Status: DC | PRN

## 2013-06-14 MED ORDER — DILTIAZEM HCL 50 MG/10ML IV SOLN
10.0000 mg | Freq: Four times a day (QID) | INTRAVENOUS | Status: DC
  Administered 2013-06-14 – 2013-06-15 (×3): 10 mg via INTRAVENOUS
  Filled 2013-06-14 (×9): qty 2

## 2013-06-14 MED ORDER — SODIUM CHLORIDE 0.9 % IJ SOLN
10.0000 mL | Freq: Two times a day (BID) | INTRAMUSCULAR | Status: DC
  Administered 2013-06-14: 10 mL
  Administered 2013-06-14: 20 mL
  Administered 2013-06-15 – 2013-06-16 (×2): 10 mL
  Administered 2013-06-16: 20 mL
  Administered 2013-06-17 (×2): 10 mL

## 2013-06-14 MED ORDER — WARFARIN SODIUM 2 MG PO TABS
4.0000 mg | ORAL_TABLET | Freq: Once | ORAL | Status: AC
  Administered 2013-06-14: 4 mg via ORAL
  Filled 2013-06-14: qty 2

## 2013-06-14 MED ORDER — AMIODARONE HCL IN DEXTROSE 360-4.14 MG/200ML-% IV SOLN
60.0000 mg/h | INTRAVENOUS | Status: AC
  Administered 2013-06-14: 60 mg/h via INTRAVENOUS

## 2013-06-14 MED ORDER — AMIODARONE HCL IN DEXTROSE 360-4.14 MG/200ML-% IV SOLN
30.0000 mg/h | INTRAVENOUS | Status: DC
  Administered 2013-06-14 – 2013-06-17 (×6): 30 mg/h via INTRAVENOUS
  Filled 2013-06-14 (×6): qty 200

## 2013-06-14 MED ORDER — AMIODARONE LOAD VIA INFUSION
150.0000 mg | Freq: Once | INTRAVENOUS | Status: AC
  Administered 2013-06-14: 150 mg via INTRAVENOUS
  Filled 2013-06-14: qty 83.34

## 2013-06-14 MED ORDER — SODIUM CHLORIDE 0.9 % IV BOLUS (SEPSIS): 1000.0000 mL | INTRAVENOUS | Status: DC | PRN

## 2013-06-14 MED ORDER — GUAIFENESIN ER 600 MG PO TB12
1200.0000 mg | ORAL_TABLET | Freq: Two times a day (BID) | ORAL | Status: DC
  Administered 2013-06-14 – 2013-06-18 (×9): 1200 mg via ORAL
  Filled 2013-06-14 (×9): qty 2

## 2013-06-14 MED ORDER — SODIUM CHLORIDE 0.9 % IV SOLN
INTRAVENOUS | Status: AC
  Administered 2013-06-14 (×2): via INTRAVENOUS

## 2013-06-14 NOTE — Progress Notes (Signed)
Patient ID: [name], male   DOB: [birth year], 79 y.o.   MRN: [id number]   CARDIOLOGY CONSULT NOTE  Patient ID: [name] MRN: [id number], DOB/AGE: 1932/77/04   Admit date: 06/13/2013 Date of Consult: 06/14/2013   Primary Physician: Josue Hector, MD Primary Cardiologist: Simona Huh  Pt. Profile  79 year old white male admitted with pneumonia. He has chronic A. fib and diastolic congestive heart failure. Asked to consult by Dr Kerry Hough on management of both conditions.  Problem List  Past Medical History  Diagnosis Date  . COPD (chronic obstructive pulmonary disease)     Uses occasional nighttime O2  . Disseminated herpes zoster 2010  . GERD (gastroesophageal reflux disease)   . Asthma   . Pulmonary embolus     2003 and 2011  . Pulmonary nodules      Chest CT 09/11  . Mixed hyperlipidemia   . Atrial fibrillation     CHADS2 score 2  . Lupus anticoagulant positive   . Hypertension   . Gall stones   . Rheumatoid arthritis(714.0)   . History of chicken pox 1941; 2011  . Anemia   . Chronic lower back pain   . Edema     Evaluated by Dr. Diona Browner - felt more likely component of cor pulmonale due to chronic lung disease (EF  60-65%, grade 2 diastolic dysfunction by echo 01/2012)  . Cirrhosis     questionable, AFP normal Feb 01/2012, hx of ETOH use     Past Surgical History  Procedure Laterality Date  . Polypectomy  02/02/2012    RMR: Multiple colonic polyps removed, flat, tubular adenomas/ Left-sided diverticulosis  . Posterior lumbar vertebrae excision  2003; 2009; 2011  . Myringotomy      "3 times; both ears"  . Cataract extraction w/ intraocular lens  implant, bilateral    . Cholecystectomy  05/23/2012    Procedure: LAPAROSCOPIC CHOLECYSTECTOMY WITH INTRAOPERATIVE CHOLANGIOGRAM;  Surgeon: Almond Lint, MD;  Location: [facility] OR;  Service: General;  Laterality: N/A;  . Esophagogastroduodenoscopy  02/02/12    NWG:[id number] size hiatal hernia; otherwise normal exam  .  Colonoscopy  01/24/2013    Procedure: COLONOSCOPY;  Surgeon: Corbin Ade, MD;  Location: [facility];  Service: Endoscopy;  Laterality: N/A;  8:30     Allergies  Allergies  Allergen Reactions  . Cymbalta (Duloxetine Hcl) Other (See Comments)    Confusion   . Procaine Hcl Hives and Other (See Comments)    NOVOCAINE: Sweating, Confusion, Not in right state of mind.    HPI   Patient admitted with progressive shortness of breath. He is currently in the ICU on nonrebreather. He denies any chest pain or tachycardia palpitations. Is not clear if he's had any increase in weight prior to admission.  His chest x-ray showed pneumonia and pulmonary vascular congestion. He has received IV Lasix and his diuresis. He has atrial fib with a rapid ventricular rate and his blood pressure dropped with IV diltiazem. He's currently in the mid 90s systolically with a heart rate of 120-130. Amiodarone has been ordered.  He also is a history of pulmonary embolus in the past. He's been on anticoagulation. His INR was subtherapeutic on admission.  Inpatient Medications  . azithromycin  500 mg Intravenous Q24H  . cefTRIAXone (ROCEPHIN)  IV  1 g Intravenous Q24H  . Chlorhexidine Gluconate Cloth  6 each Topical Q0600  . gabapentin  300 mg Oral BID  . guaiFENesin  1,200 mg Oral BID  . methotrexate  12.5 mg Oral Weekly  . mupirocin ointment  1 application Nasal BID  . pantoprazole  40 mg Oral Daily  . sodium chloride  10-40 mL Intracatheter Q12H  . sodium chloride  3 mL Intravenous Q12H  . Warfarin - Pharmacist Dosing Inpatient   Does not apply Q24H    Family History Family History  Problem Relation Age of Onset  . Colon cancer Neg Hx   . Liver disease Neg Hx      Social History History   Social History  . Marital Status: Widowed    Spouse Name: N/A    Number of Children: 6  . Years of Education: N/A   Occupational History  . retired    Social History Main Topics  . Smoking status:  Former Smoker -- 1.00 packs/day for 57 years    Types: Cigarettes    Quit date: 01/27/1998  . Smokeless tobacco: Never Used  . Alcohol Use: No     Comment: "quit drinking 1976"  . Drug Use: No  . Sexually Active: Not Currently   Other Topics Concern  . Not on file   Social History Narrative  . No narrative on file     Review of Systems  General:  No weight changes.  Cardiovascular:  No chest pain,  edema, orthopnea, palpitations, paroxysmal nocturnal dyspnea. Dermatological: No rash, lesions/masses Respiratory: Cough and dyspnea Urologic: No hematuria, dysuria Abdominal:   No nausea, vomiting, diarrhea, bright red blood per rectum, melena, or hematemesis Neurologic:  No visual changes, wkns, changes in mental status. All other systems reviewed and are otherwise negative except as noted above.  Physical Exam  Blood pressure 108/56, pulse 103, temperature 97.8 F (36.6 C), temperature source Oral, resp. rate 20, height 6\' 1"  (1.854 m), weight 225 lb 15.5 oz (102.5 kg), SpO2 98.00%.  General: Pleasant, NAD, long beard  Psych: Normal affect. Neuro: Alert and oriented X 3. Moves all extremities spontaneously. HEENT: Normal  Neck: Supple, no obvious bruit, JVD difficult to assess Lungs:  Decreased breath sounds in both bases, inspiratory start or rhonchi Heart: Rapid irregular rate and rhythm Abdomen: Soft, non-tender, non-distended, BS + x 4.  Extremities: No clubbing, cyanosis or edema. DP/PT/Radials 2+ and equal bilaterally.  Labs   Recent Labs  06/13/13 1428 06/14/13 0925  TROPONINI <0.30 <0.30   Lab Results  Component Value Date   WBC 21.5* 06/14/2013   HGB 12.5* 06/14/2013   HCT 38.9* 06/14/2013   MCV 98.2 06/14/2013   PLT 165 06/14/2013    Recent Labs Lab 06/14/13 0435  NA 139  K 4.2  CL 100  CO2 28  BUN 17  CREATININE 1.50*  CALCIUM 8.6  PROT 7.0  BILITOT 2.0*  ALKPHOS 59  ALT 8  AST 16  GLUCOSE 85   Lab Results  Component Value Date   CHOL  160 05/22/2012   HDL 49 05/22/2012   LDLCALC 91 05/22/2012   TRIG 98 05/22/2012   Lab Results  Component Value Date   DDIMER  Value: 19.74        AT THE INHOUSE ESTABLISHED CUTOFF VALUE OF 0.48 ug/mL FEU, THIS ASSAY HAS BEEN DOCUMENTED IN THE LITERATURE TO HAVE A SENSITIVITY AND NEGATIVE PREDICTIVE VALUE OF AT LEAST 98 TO 99%.  THE TEST RESULT SHOULD BE CORRELATED WITH AN ASSESSMENT OF THE CLINICAL PROBABILITY OF DVT / VTE.* 09/03/2010    Radiology/Studies  Ct Angio Chest Pe W/cm &/or Wo Cm  06/13/2013   *RADIOLOGY REPORT*  Clinical Data:  ABDOMINAL PAIN, CHEST PAIN, SHORTNESS OF BREATH.  CT ANGIOGRAPHY CHEST CT ABDOMEN AND PELVIS WITH CONTRAST  Technique:  Multidetector CT imaging of the chest was performed using the standard protocol during bolus administration of intravenous contrast.  Multiplanar CT image reconstructions including MIPs were obtained to evaluate the vascular anatomy. Multidetector CT imaging of the abdomen and pelvis was performed using the standard protocol during bolus administration of intravenous contrast.  Contrast: 50mL OMNIPAQUE IOHEXOL 300 MG/ML  SOLN, OMNIPAQUE IOHEXOL 350 MG/ML SOLN  Comparison:  05/21/2012  CTA CHEST  Findings:  There is good contrast opacification of the pulmonary artery branches.  No discrete filling defect to suggest acute PE.Breathing during the acquisition degrades some of the images. Adequate contrast opacification of the thoracic aorta with no evidence of dissection, aneurysm, or stenosis. There is classic 3- vessel brachiocephalic arch anatomy.  Coronary and aortic calcifications.  No pleural or pericardial effusion.  New right hilar adenopathy up to   20 mm transverse diameter.  New subcarinal adenopathy up to 14 mm short axis diameter.  Sub centimeter prevascular and AP window  nodes.  12 mm enlarged pretracheal node and  10 mm right paratracheal node on image 21/4. Multiple small subpleural blebs noted in the upper lobes.  9-mm lingular nodule  image 49/5 stable since 03/29/2011.  Smaller subpleural nodule is degraded by breathing motion.  6 mm nodule laterally in the right upper lobe image 22/5 is new since prior 03/29/2011.  There are new coarse airspace opacities in the posterior, medial, and lateral basal segments left lower lobe, medial and posterior basal segments right lower lobe with air bronchograms evident.  Old T4 and T12 compression fracture deformities.  Sternum intact.  Review of the MIP images confirms the above findings.  IMPRESSION: 1.  Negative for acute PE or thoracic aortic dissection. 2.  Airspace infiltrates in both lower lobes, left worse than right, suggesting pneumonia more likely than atypical edema or neoplasm.  3. New right hilar and mediastinal adenopathy is nonspecific, possibly reactive although follow-up recommended for appropriate resolution. 4.  Stable lingular pulmonary nodules.  New 6 mm right upper lobe pulmonary nodule. 5. Atherosclerosis, including . coronary artery disease. Please note that although the presence of coronary artery calcium documents the presence of coronary artery disease, the severity of this disease and any potential stenosis cannot be assessed on this non-gated CT examination.  Assessment for potential risk factor modification, dietary therapy or pharmacologic therapy may be warranted, if clinically indicated.  CT ABDOMEN AND PELVIS  Findings: Stable small hepatic cysts.  No new hepatic lesion. Vascular clips in the gallbladder fossa.  Scattered calcified granulomas in the spleen.  Unremarkable adrenal glands and pancreas.  Stable small bilateral renal cysts.  No hydronephrosis. Aortoiliac arterial calcifications.  Small hiatal hernia.  Stomach, small bowel, and colon are nondilated.  Normal appendix.  Urinary bladder physiologically distended.  Mild prostatic enlargement.  No ascites.  No free air.  No adenopathy.  Portal vein patent. Changes of instrumented PLIF L4-5.  Stable mild compression  fracture deformities L1, L2, L3.  Spondylitic changes L3-4 and L5- S1. 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  Review of the MIP images confirms the above findings.  IMPRESSION:  1.  No acute abdominal process. 2.  Small hiatal hernia. 3.  Stable chronic and postoperative changes as above.   Original Report Authenticated By: D. Andria Rhein, MD   Ct Abdomen Pelvis W Contrast  06/13/2013   *RADIOLOGY REPORT*  Clinical Data:  ABDOMINAL PAIN, CHEST PAIN, SHORTNESS OF BREATH.  CT ANGIOGRAPHY CHEST CT ABDOMEN AND PELVIS WITH CONTRAST  Technique:  Multidetector CT imaging of the chest was performed using the standard protocol during bolus administration of intravenous contrast.  Multiplanar CT image reconstructions including MIPs were obtained to evaluate the vascular anatomy. Multidetector CT imaging of the abdomen and pelvis was performed using the standard protocol during bolus administration of intravenous contrast.  Contrast: 50mL OMNIPAQUE IOHEXOL 300 MG/ML  SOLN, OMNIPAQUE IOHEXOL 350 MG/ML SOLN  Comparison:  05/21/2012  CTA CHEST  Findings:  There is good contrast opacification of the pulmonary artery branches.  No discrete filling defect to suggest acute PE.Breathing during the acquisition degrades some of the images. Adequate contrast opacification of the thoracic aorta with no evidence of dissection, aneurysm, or stenosis. There is classic 3- vessel brachiocephalic arch anatomy.  Coronary and aortic calcifications.  No pleural or pericardial effusion.  New right hilar adenopathy up to   20 mm transverse diameter.  New subcarinal adenopathy up to 14 mm short axis diameter.  Sub centimeter prevascular and AP window  nodes.  12 mm enlarged pretracheal node and  10 mm right paratracheal node on image 21/4. Multiple small subpleural blebs noted in the upper lobes.  9-mm lingular nodule image 49/5 stable since 03/29/2011.  Smaller subpleural nodule is degraded by breathing motion.  6 mm nodule laterally in the right  upper lobe image 22/5 is new since prior 03/29/2011.  There are new coarse airspace opacities in the posterior, medial, and lateral basal segments left lower lobe, medial and posterior basal segments right lower lobe with air bronchograms evident.  Old T4 and T12 compression fracture deformities.  Sternum intact.  Review of the MIP images confirms the above findings.  IMPRESSION: 1.  Negative for acute PE or thoracic aortic dissection. 2.  Airspace infiltrates in both lower lobes, left worse than right, suggesting pneumonia more likely than atypical edema or neoplasm.  3. New right hilar and mediastinal adenopathy is nonspecific, possibly reactive although follow-up recommended for appropriate resolution. 4.  Stable lingular pulmonary nodules.  New 6 mm right upper lobe pulmonary nodule. 5. Atherosclerosis, including . coronary artery disease. Please note that although the presence of coronary artery calcium documents the presence of coronary artery disease, the severity of this disease and any potential stenosis cannot be assessed on this non-gated CT examination.  Assessment for potential risk factor modification, dietary therapy or pharmacologic therapy may be warranted, if clinically indicated.  CT ABDOMEN AND PELVIS  Findings: Stable small hepatic cysts.  No new hepatic lesion. Vascular clips in the gallbladder fossa.  Scattered calcified granulomas in the spleen.  Unremarkable adrenal glands and pancreas.  Stable small bilateral renal cysts.  No hydronephrosis. Aortoiliac arterial calcifications.  Small hiatal hernia.  Stomach, small bowel, and colon are nondilated.  Normal appendix.  Urinary bladder physiologically distended.  Mild prostatic enlargement.  No ascites.  No free air.  No adenopathy.  Portal vein patent. Changes of instrumented PLIF L4-5.  Stable mild compression fracture deformities L1, L2, L3.  Spondylitic changes L3-4 and L5- S1. 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  Review of the MIP images  confirms the above findings.  IMPRESSION:  1.  No acute abdominal process. 2.  Small hiatal hernia. 3.  Stable chronic and postoperative changes as above.   Original Report Authenticated By: D. Andria Rhein, MD   Dg Chest Portable 1 View  06/14/2013   *RADIOLOGY REPORT*  Clinical Data:  Sepsis  PORTABLE CHEST - 1 VIEW  Comparison: 06/13/2013  Findings: The cardiac shadow is stable.  Bilateral interstitial changes are again identified and stable from prior study.  These are most noted in the bases bilaterally.  No new focal infiltrate is seen.  IMPRESSION: Stable appearance of the chest from previous day.   Original Report Authenticated By: Alcide Clever, M.D.   Dg Chest Port 1 View  06/13/2013   *RADIOLOGY REPORT*  Clinical Data: Short of breath  PORTABLE CHEST - 1 VIEW  Comparison: Prior chest x-ray 10/12/2012  Findings: Pulmonary vascular congestion with mild interstitial edema.  Patchy bibasilar opacities appears similar to prior and likely reflects chronic changes and atelectasis.  Stable cardiomegaly.  Atherosclerotic calcifications noted in the transverse aorta.  There may be small bilateral pleural effusions. Stable granulomatous nodule in the lingula.  IMPRESSION:  1. Pulmonary vascular congestion with diffuse bilateral interstitial and airspace opacities most consistent with mild pulmonary edema.  Atypical infection could have a similar appearance. 2.  Stable cardiomegaly. 3.  Stable granulomatous nodules in the lingula. 4.  Background COPD and emphysema.   Original Report Authenticated By: Malachy Moan, M.D.   Dg Chest Port 1v Same Day  06/14/2013   *RADIOLOGY REPORT*  Clinical Data: Repositioned PICC line  PORTABLE CHEST - 1 VIEW SAME DAY  Comparison: 06/14/2013 1040 hours  Findings: The heart and pulmonary vascularity are stable.  Stable interstitial changes are seen.  The PICC line has been repositioned and now lies at the distal superior vena cava.  IMPRESSION: Successfully repositioned PICC  line.  The remainder of the exam is stable from previous study.   Original Report Authenticated By: Alcide Clever, M.D.   Dg Chest Port 1v Same Day  06/14/2013   *RADIOLOGY REPORT*  Clinical Data: PICC line placement  PORTABLE CHEST - 1 VIEW SAME DAY  Comparison: Earlier same day; 06/13/2013; chest CT of 06/13/2013  Findings:  Interval placement of right upper extremity approach PICC line whose tip projects over the contralateral left innominate vein.  Grossly unchanged enlarged cardiac silhouette and mediastinal contours.  Atherosclerotic calcifications within the thoracic aorta.  Lung volumes remain reduced with grossly unchanged bibasilar heterogeneous coarse heterogeneous reticular opacities, left greater than right.  Trace bilateral effusions are not excluded.  No definite evidence of edema.  No pneumothorax. Vascular calcifications overlie the bilateral carotid bulbs.  IMPRESSION: 1.  Malpositioned right upper extremity approach PICC line with tip projected over the contralateral left innominate vein. Repositioning is advised. 2.  Grossly unchanged hypoventilation and bibasilar coarse reticular opacities, left greater than right, worrisome for multifocal infection.  This was made a call report.   Original Report Authenticated By: Tacey Ruiz, MD    ECG  Chronic A. fib with a rapid ventricular rate, no acute changes from previous ECG.  ASSESSMENT AND PLAN  Mr. Raimondi presents with pneumonia and possibly early sepsis. He has chronic atrial fibrillation now with an increased ventricular rate. Systolic blood pressures in the 90s and he did not tolerate IV diltiazem. He is diuresed some since admission. He has a history of chronic diastolic congestive heart failure.  Recommendations  #1 stop IV Lasix  #2 agree with starting amiodarone for rate control. Doubt very seriously (sinus rhythm.  #3 continue anticoagulation with an INR of 2-3.   Signed, Valera Castle, MD 06/14/2013, 12:53 PM

## 2013-06-14 NOTE — Care Management Note (Signed)
    Page 1 of 2   06/18/2013     8:32:27 AM   CARE MANAGEMENT NOTE 06/18/2013  Patient:  [name], [name]   Account Number:  [id number]  Date Initiated:  06/14/2013  Documentation initiated by:  Anibal Henderson  Subjective/Objective Assessment:   Admitted with sepsis, a fib, PNA. pt is on sepsis protocol at present, and not feeling up to talking. Will visit at another time. he is from home with children, but no one in the room with him at present. May need PT consult as pt improves     Action/Plan:   to determine D/C needs- may need HH if returns home   Anticipated DC Date:  06/14/2013   Anticipated DC Plan:  HOME W HOME HEALTH SERVICES      DC Planning Services  CM consult      [facility] Choice  HOME HEALTH   Choice offered to / List presented to:  C-1 Patient        HH arranged  HH-1 RN      [facility] agency  [facility]   Status of service:  Completed, signed off Medicare Important Message given?  YES (If response is "NO", the following Medicare IM given date fields will be blank) Date Medicare IM given:   Date Additional Medicare IM given:    Discharge Disposition:  HOME W HOME HEALTH SERVICES  Per UR Regulation:  Reviewed for med. necessity/level of care/duration of stay  If discussed at Long Length of Stay Meetings, dates discussed:    Comments:  06/18/13 0830 Arlyss Queen, RN BSN CM Pt discharged home today with [facility] for Rn. Alroy Bailiff of [facility] is aware of pt and will collect the pts information from the chart. Pt has home O2 to use prn. HH services to start within 48 hours of discharge. Pt and pts nurse aware of discharge arrangements.  06/14/13 1000 Anibal Henderson RN/CM

## 2013-06-14 NOTE — Progress Notes (Signed)
PT Cancellation Note  Patient Details Name: [name] MRN: [id number] DOB: 04/26/1931   Cancelled Treatment:    Reason Eval/Treat Not Completed: Medical issues which prohibited therapy RN states that pt is now on sepsis protocol and unable to have any PT.  We will d/c orders.  Please reconsult when it is appropriate.  Myrlene Broker L 06/14/2013, 10:08 AM

## 2013-06-14 NOTE — Progress Notes (Signed)
UR Chart Review Completed  

## 2013-06-14 NOTE — Progress Notes (Addendum)
0300 Called Dr. Rito Ehrlich due to low BP. Cardizem gtt titrated to off at this time. HR 70s-90s. BP 73/42. Told to get another BP in 15 minutes and call if still low.  0320 BP still low 71/40 Called MD back; new orders received

## 2013-06-14 NOTE — Progress Notes (Signed)
[facility] PROGRESS NOTE  [name] XWR:[id number] DOB: 11-Jan-1931 DOA: 06/13/2013 PCP: Josue Hector, MD  Assessment/Plan: 1. Sepsis due to pneumonia. Patient is on Rocephin and azithromycin. We will continue careful hydration with his history of diastolic congestive heart failure. Blood cultures have been sent. We'll also send sputum culture. Initiated code sepsis. Patient will be placed with a PICC line and we will monitor CVP readings. 2. Community-acquired pneumonia. Patient is on antibiotics and pulmonary hygiene. 3. Acute respiratory failure. The patient was started on oxygen via Ventimask earlier this morning. This was primarily due to him not being able to keep the nasal cannula in place. We'll titrate oxygen down as tolerated. 4. COPD. No wheezing at this time. Continue as needed bronchodilators. 5. Atrial fibrillation with ventricular response. Likely secondary to underlying acute illness. Patient was initially started on a Cardizem infusion but became very hypotensive and this had to be discontinued. His blood pressures are running on the lower side with a systolic in the 90s. We will start him on an amiodarone infusion. At least in the short-term, this may be helpful. With his elevating creatinine, digoxin may not be the best option. Cardiology has been consulted for further assistance. He is anticoagulated with Coumadin. 6. Acute renal failure. Likely due to ATN from hypotension. Likely also has an element of contrast-induced nephropathy. Patient did have CT in view of the chest and abdomen done yesterday. We'll continue to monitor urine output closely. Continue gentle hydration.  Code Status: DO NOT RESUSCITATE. I discussed the option for chest compressions, defibrillation, intubation. The patient reports that he would not want these measures taken, but would wish to receive available treatments to that point. Family Communication: discussed with patient Disposition  Plan: To be determined   Consultants:  none  Procedures:  None  Antibiotics:  Rocephin 6/19  Azithromycin 6/19  HPI/Subjective: Feels a little better this morning. Still short of breath, a little better. He is coughing with left-sided pleuritic chest pain. He was placed on Ventimask since every time he moves, he reports his nasal cannula would fall out of his nose.  Objective: Filed Vitals:   06/14/13 0615 06/14/13 0630 06/14/13 0645 06/14/13 0730  BP: 91/41 96/42 99/53    Pulse: 107 84 114   Temp:      TempSrc:      Resp: 21 19 22    Height:      Weight:      SpO2: 94% 93% 95% 96%    Intake/Output Summary (Last 24 hours) at 06/14/13 0817 Last data filed at 06/14/13 0600  Gross per 24 hour  Intake 1205.91 ml  Output      0 ml  Net 1205.91 ml   Filed Weights   06/13/13 1357 06/13/13 2058 06/14/13 0500  Weight: 103.42 kg (228 lb) 101.5 kg (223 lb 12.3 oz) 102.5 kg (225 lb 15.5 oz)    Exam:   General:  NAD, does not appear to have increased work of breathing, able to carry on conversation, alert and oriented, mentation intact  Cardiovascular: tachycardic, irregular  Respiratory: diminished breath sounds at bases  Abdomen: soft, distended, non tender, bs present  Musculoskeletal: trace edema b/l   Data Reviewed: Basic Metabolic Panel:  Recent Labs Lab 06/13/13 1428 06/14/13 0435  NA 140 139  K 3.8 4.2  CL 104 100  CO2 28 28  GLUCOSE 119* 85  BUN 12 17  CREATININE 1.04 1.50*  CALCIUM 9.4 8.6   Liver Function Tests:  Recent Labs  Lab 06/13/13 1428 06/14/13 0435  AST 17 16  ALT 10 8  ALKPHOS 58 59  BILITOT 2.2* 2.0*  PROT 7.9 7.0  ALBUMIN 3.6 3.1*    Recent Labs Lab 06/13/13 1428  LIPASE 21   No results found for this basename: AMMONIA,  in the last 168 hours CBC:  Recent Labs Lab 06/13/13 1428 06/14/13 0435  WBC 14.5* 21.5*  NEUTROABS 11.4*  --   HGB 13.6 12.5*  HCT 41.9 38.9*  MCV 96.3 98.2  PLT 169 165   Cardiac  Enzymes:  Recent Labs Lab 06/13/13 1428  TROPONINI <0.30   BNP (last 3 results)  Recent Labs  06/13/13 1428  PROBNP 1637.0*   CBG: No results found for this basename: GLUCAP,  in the last 168 hours  Recent Results (from the past 240 hour(s))  CULTURE, BLOOD (ROUTINE X 2)     Status: None   Collection Time    06/13/13  6:51 PM      Result Value Range Status   Specimen Description BLOOD LEFT ANTECUBITAL   Final   Special Requests BOTTLES DRAWN AEROBIC AND ANAEROBIC 10CC   Final   Culture NO GROWTH 1 DAY   Final   Report Status PENDING   Incomplete  CULTURE, BLOOD (ROUTINE X 2)     Status: None   Collection Time    06/13/13  7:03 PM      Result Value Range Status   Specimen Description BLOOD LEFT HAND   Final   Special Requests BOTTLES DRAWN AEROBIC AND ANAEROBIC 6CC   Final   Culture NO GROWTH 1 DAY   Final   Report Status PENDING   Incomplete  MRSA PCR SCREENING     Status: Abnormal   Collection Time    06/13/13  8:51 PM      Result Value Range Status   MRSA by PCR POSITIVE (*) NEGATIVE Final   Comment:            The GeneXpert MRSA Assay (FDA     approved for NASAL specimens     only), is one component of a     comprehensive MRSA colonization     surveillance program. It is not     intended to diagnose MRSA     infection nor to guide or     monitor treatment for     MRSA infections.     RESULT CALLED TO, READ BACK BY AND VERIFIED WITH:     KINNEY,N ON 06/13/13 AT 2300 BY LOY,C     Studies: Ct Angio Chest Pe W/cm &/or Wo Cm  06/13/2013   *RADIOLOGY REPORT*  Clinical Data:  ABDOMINAL PAIN, CHEST PAIN, SHORTNESS OF BREATH.  CT ANGIOGRAPHY CHEST CT ABDOMEN AND PELVIS WITH CONTRAST  Technique:  Multidetector CT imaging of the chest was performed using the standard protocol during bolus administration of intravenous contrast.  Multiplanar CT image reconstructions including MIPs were obtained to evaluate the vascular anatomy. Multidetector CT imaging of the abdomen and  pelvis was performed using the standard protocol during bolus administration of intravenous contrast.  Contrast: 50mL OMNIPAQUE IOHEXOL 300 MG/ML  SOLN, OMNIPAQUE IOHEXOL 350 MG/ML SOLN  Comparison:  05/21/2012  CTA CHEST  Findings:  There is good contrast opacification of the pulmonary artery branches.  No discrete filling defect to suggest acute PE.Breathing during the acquisition degrades some of the images. Adequate contrast opacification of the thoracic aorta with no evidence of dissection, aneurysm, or stenosis. There is  classic 3- vessel brachiocephalic arch anatomy.  Coronary and aortic calcifications.  No pleural or pericardial effusion.  New right hilar adenopathy up to   20 mm transverse diameter.  New subcarinal adenopathy up to 14 mm short axis diameter.  Sub centimeter prevascular and AP window  nodes.  12 mm enlarged pretracheal node and  10 mm right paratracheal node on image 21/4. Multiple small subpleural blebs noted in the upper lobes.  9-mm lingular nodule image 49/5 stable since 03/29/2011.  Smaller subpleural nodule is degraded by breathing motion.  6 mm nodule laterally in the right upper lobe image 22/5 is new since prior 03/29/2011.  There are new coarse airspace opacities in the posterior, medial, and lateral basal segments left lower lobe, medial and posterior basal segments right lower lobe with air bronchograms evident.  Old T4 and T12 compression fracture deformities.  Sternum intact.  Review of the MIP images confirms the above findings.  IMPRESSION: 1.  Negative for acute PE or thoracic aortic dissection. 2.  Airspace infiltrates in both lower lobes, left worse than right, suggesting pneumonia more likely than atypical edema or neoplasm.  3. New right hilar and mediastinal adenopathy is nonspecific, possibly reactive although follow-up recommended for appropriate resolution. 4.  Stable lingular pulmonary nodules.  New 6 mm right upper lobe pulmonary nodule. 5. Atherosclerosis,  including . coronary artery disease. Please note that although the presence of coronary artery calcium documents the presence of coronary artery disease, the severity of this disease and any potential stenosis cannot be assessed on this non-gated CT examination.  Assessment for potential risk factor modification, dietary therapy or pharmacologic therapy may be warranted, if clinically indicated.  CT ABDOMEN AND PELVIS  Findings: Stable small hepatic cysts.  No new hepatic lesion. Vascular clips in the gallbladder fossa.  Scattered calcified granulomas in the spleen.  Unremarkable adrenal glands and pancreas.  Stable small bilateral renal cysts.  No hydronephrosis. Aortoiliac arterial calcifications.  Small hiatal hernia.  Stomach, small bowel, and colon are nondilated.  Normal appendix.  Urinary bladder physiologically distended.  Mild prostatic enlargement.  No ascites.  No free air.  No adenopathy.  Portal vein patent. Changes of instrumented PLIF L4-5.  Stable mild compression fracture deformities L1, L2, L3.  Spondylitic changes L3-4 and L5- S1. 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  Review of the MIP images confirms the above findings.  IMPRESSION:  1.  No acute abdominal process. 2.  Small hiatal hernia. 3.  Stable chronic and postoperative changes as above.   Original Report Authenticated By: D. Andria Rhein, MD   Ct Abdomen Pelvis W Contrast  06/13/2013   *RADIOLOGY REPORT*  Clinical Data:  ABDOMINAL PAIN, CHEST PAIN, SHORTNESS OF BREATH.  CT ANGIOGRAPHY CHEST CT ABDOMEN AND PELVIS WITH CONTRAST  Technique:  Multidetector CT imaging of the chest was performed using the standard protocol during bolus administration of intravenous contrast.  Multiplanar CT image reconstructions including MIPs were obtained to evaluate the vascular anatomy. Multidetector CT imaging of the abdomen and pelvis was performed using the standard protocol during bolus administration of intravenous contrast.  Contrast: 50mL OMNIPAQUE  IOHEXOL 300 MG/ML  SOLN, OMNIPAQUE IOHEXOL 350 MG/ML SOLN  Comparison:  05/21/2012  CTA CHEST  Findings:  There is good contrast opacification of the pulmonary artery branches.  No discrete filling defect to suggest acute PE.Breathing during the acquisition degrades some of the images. Adequate contrast opacification of the thoracic aorta with no evidence of dissection, aneurysm, or stenosis. There  is classic 3- vessel brachiocephalic arch anatomy.  Coronary and aortic calcifications.  No pleural or pericardial effusion.  New right hilar adenopathy up to   20 mm transverse diameter.  New subcarinal adenopathy up to 14 mm short axis diameter.  Sub centimeter prevascular and AP window  nodes.  12 mm enlarged pretracheal node and  10 mm right paratracheal node on image 21/4. Multiple small subpleural blebs noted in the upper lobes.  9-mm lingular nodule image 49/5 stable since 03/29/2011.  Smaller subpleural nodule is degraded by breathing motion.  6 mm nodule laterally in the right upper lobe image 22/5 is new since prior 03/29/2011.  There are new coarse airspace opacities in the posterior, medial, and lateral basal segments left lower lobe, medial and posterior basal segments right lower lobe with air bronchograms evident.  Old T4 and T12 compression fracture deformities.  Sternum intact.  Review of the MIP images confirms the above findings.  IMPRESSION: 1.  Negative for acute PE or thoracic aortic dissection. 2.  Airspace infiltrates in both lower lobes, left worse than right, suggesting pneumonia more likely than atypical edema or neoplasm.  3. New right hilar and mediastinal adenopathy is nonspecific, possibly reactive although follow-up recommended for appropriate resolution. 4.  Stable lingular pulmonary nodules.  New 6 mm right upper lobe pulmonary nodule. 5. Atherosclerosis, including . coronary artery disease. Please note that although the presence of coronary artery calcium documents the presence of  coronary artery disease, the severity of this disease and any potential stenosis cannot be assessed on this non-gated CT examination.  Assessment for potential risk factor modification, dietary therapy or pharmacologic therapy may be warranted, if clinically indicated.  CT ABDOMEN AND PELVIS  Findings: Stable small hepatic cysts.  No new hepatic lesion. Vascular clips in the gallbladder fossa.  Scattered calcified granulomas in the spleen.  Unremarkable adrenal glands and pancreas.  Stable small bilateral renal cysts.  No hydronephrosis. Aortoiliac arterial calcifications.  Small hiatal hernia.  Stomach, small bowel, and colon are nondilated.  Normal appendix.  Urinary bladder physiologically distended.  Mild prostatic enlargement.  No ascites.  No free air.  No adenopathy.  Portal vein patent. Changes of instrumented PLIF L4-5.  Stable mild compression fracture deformities L1, L2, L3.  Spondylitic changes L3-4 and L5- S1. 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  Review of the MIP images confirms the above findings.  IMPRESSION:  1.  No acute abdominal process. 2.  Small hiatal hernia. 3.  Stable chronic and postoperative changes as above.   Original Report Authenticated By: D. Andria Rhein, MD   Dg Chest Port 1 View  06/13/2013   *RADIOLOGY REPORT*  Clinical Data: Short of breath  PORTABLE CHEST - 1 VIEW  Comparison: Prior chest x-ray 10/12/2012  Findings: Pulmonary vascular congestion with mild interstitial edema.  Patchy bibasilar opacities appears similar to prior and likely reflects chronic changes and atelectasis.  Stable cardiomegaly.  Atherosclerotic calcifications noted in the transverse aorta.  There may be small bilateral pleural effusions. Stable granulomatous nodule in the lingula.  IMPRESSION:  1. Pulmonary vascular congestion with diffuse bilateral interstitial and airspace opacities most consistent with mild pulmonary edema.  Atypical infection could have a similar appearance. 2.  Stable cardiomegaly. 3.   Stable granulomatous nodules in the lingula. 4.  Background COPD and emphysema.   Original Report Authenticated By: Malachy Moan, M.D.    Scheduled Meds: . amiodarone  150 mg Intravenous Once  . azithromycin  500 mg Intravenous Q24H  .  cefTRIAXone (ROCEPHIN)  IV  1 g Intravenous Q24H  . Chlorhexidine Gluconate Cloth  6 each Topical Q0600  . gabapentin  300 mg Oral BID  . guaiFENesin  1,200 mg Oral BID  . methotrexate  12.5 mg Oral Weekly  . mupirocin ointment  1 application Nasal BID  . pantoprazole  40 mg Oral Daily  . sodium chloride  3 mL Intravenous Q12H  . Warfarin - Pharmacist Dosing Inpatient   Does not apply Q24H   Continuous Infusions: . sodium chloride 10 mL/hr at 06/14/13 0600  . sodium chloride    . amiodarone (NEXTERONE PREMIX) 360 mg/200 mL dextrose     Followed by  . amiodarone (NEXTERONE PREMIX) 360 mg/200 mL dextrose      Principal Problem:   CAP (community acquired pneumonia) Active Problems:   Atrial fibrillation with RVR   COPD (chronic obstructive pulmonary disease)   Long term current use of anticoagulant   Obesity (BMI 35.0-39.9 without comorbidity)   Hypoxemia    Time spent: Critical care time:    [facility]  [facility] Pager [phone]. If 7PM-7AM, please contact night-coverage at [email or web address], password [facility] 06/14/2013, 8:17 AM  LOS: 1 day

## 2013-06-14 NOTE — Progress Notes (Signed)
ANTICOAGULATION CONSULT NOTE  Pharmacy Consult for Warfarin Indication: atrial fibrillation  Allergies  Allergen Reactions  . Cymbalta (Duloxetine Hcl) Other (See Comments)    Confusion   . Procaine Hcl Hives and Other (See Comments)    NOVOCAINE: Sweating, Confusion, Not in right state of mind.    Patient Measurements: Height: 6\' 1"  (185.4 cm) Weight: 225 lb 15.5 oz (102.5 kg) IBW/kg (Calculated) : 79.9  Vital Signs: Temp: 97.8 F (36.6 C) (06/20 0400) Temp src: Oral (06/20 0400) BP: 99/53 mmHg (06/20 0645) Pulse Rate: 114 (06/20 0645)  Labs:  Recent Labs  06/13/13 1428 06/14/13 0435 06/14/13 0925  HGB 13.6 12.5*  --   HCT 41.9 38.9*  --   PLT 169 165  --   APTT  --   --  50*  LABPROT 20.3* 22.6* 23.0*  INR 1.81* 2.09* 2.14*  CREATININE 1.04 1.50*  --   TROPONINI <0.30  --  <0.30    Estimated Creatinine Clearance: 50.2 ml/min (by C-G formula based on Cr of 1.5).   Medical History: Past Medical History  Diagnosis Date  . COPD (chronic obstructive pulmonary disease)     Uses occasional nighttime O2  . Disseminated herpes zoster 2010  . GERD (gastroesophageal reflux disease)   . Asthma   . Pulmonary embolus     2003 and 2011  . Pulmonary nodules      Chest CT 09/11  . Mixed hyperlipidemia   . Atrial fibrillation     CHADS2 score 2  . Lupus anticoagulant positive   . Hypertension   . Gall stones   . Rheumatoid arthritis(714.0)   . History of chicken pox 1941; 2011  . Anemia   . Chronic lower back pain   . Edema     Evaluated by Dr. Diona Browner - felt more likely component of cor pulmonale due to chronic lung disease (EF  60-65%, grade 2 diastolic dysfunction by echo 01/2012)  . Cirrhosis     questionable, AFP normal Feb 01/2012, hx of ETOH use     Medications:  Prescriptions prior to admission  Medication Sig Dispense Refill  . cetirizine (ZYRTEC) 10 MG tablet Take 10 mg by mouth daily.        . cloNIDine (CATAPRES) 0.1 MG tablet Take 0.1 mg by  mouth 2 (two) times daily.        Marland Kitchen diltiazem (CARTIA XT) 240 MG 24 hr capsule Take 1 capsule (240 mg total) by mouth at bedtime.  30 capsule  11  . ferrous gluconate (FERGON) 325 MG tablet Take 325 mg by mouth daily with breakfast.      . folic acid (FOLVITE) 1 MG tablet Take 1 mg by mouth daily.       . furosemide (LASIX) 40 MG tablet Take 40 mg by mouth 2 (two) times daily.       Marland Kitchen gabapentin (NEURONTIN) 300 MG capsule Take 300 mg by mouth 2 (two) times daily.       . methotrexate (RHEUMATREX) 2.5 MG tablet Take 2.5 mg by mouth once a week. *TAKE 5 TABLETS BY MOUTH FOR A TOTAL 12.5MG  WEEKLY ON FRIDAYS*      . Omega-3 Fatty Acids (FISH OIL) 1000 MG CAPS Take 1 capsule by mouth 2 (two) times daily.       Marland Kitchen omeprazole (PRILOSEC) 20 MG capsule Take 20 mg by mouth 2 (two) times daily.       Marland Kitchen oxyCODONE-acetaminophen (PERCOCET) 10-325 MG per tablet Take 1 tablet by mouth  every 6 (six) hours as needed for pain.      . potassium chloride SA (K-DUR,KLOR-CON) 20 MEQ tablet Take 20 mEq by mouth 2 (two) times daily.        Marland Kitchen warfarin (COUMADIN) 2 MG tablet Take 5-7 mg by mouth every evening. Takes 5mg  daily except takes 7mg  on Wednesdays      . albuterol (PROAIR HFA) 108 (90 BASE) MCG/ACT inhaler Inhale 2 puffs into the lungs every 6 (six) hours as needed. Shortness of Breath      . albuterol (PROVENTIL) (2.5 MG/3ML) 0.083% nebulizer solution Take 2.5 mg by nebulization every 6 (six) hours as needed. Shortness of Breath        Assessment: 79 yo M on chronic warfarin 4.5mg  daily for hx multiple PE.  INR slightly below goal on admission but within goal range today.   He was admitted with infectious process & has ARF which can make him more sensitive to warfarin. No bleeding noted.   Goal of Therapy:  INR 2-3   Plan:  Warfarin 4mg  PO x 1 Daily PT/INR  Elson Clan 06/14/2013,11:25 AM

## 2013-06-14 NOTE — Progress Notes (Signed)
Occupational Therapy Screen   Due to Sepsis protocol, patient is not appropriate for acute OT at this time. Will discharge from acute OT services at this.  Limmie Patricia, OTR/L,CBIS  06/14/13 9:23AM

## 2013-06-14 NOTE — Progress Notes (Signed)
PICC malpositioned per radiologist findings. Pt sat upright in bed and power flushed all three ports, will reXray in .

## 2013-06-15 ENCOUNTER — Inpatient Hospital Stay (HOSPITAL_COMMUNITY): Payer: Medicare Other

## 2013-06-15 LAB — COMPREHENSIVE METABOLIC PANEL
ALT: 8 U/L (ref 0–53)
AST: 15 U/L (ref 0–37)
Albumin: 2.8 g/dL — ABNORMAL LOW (ref 3.5–5.2)
Alkaline Phosphatase: 65 U/L (ref 39–117)
BUN: 13 mg/dL (ref 6–23)
Potassium: 3.6 mEq/L (ref 3.5–5.1)
Sodium: 139 mEq/L (ref 135–145)
Total Protein: 6.8 g/dL (ref 6.0–8.3)

## 2013-06-15 LAB — CBC
HCT: 35 % — ABNORMAL LOW (ref 39.0–52.0)
MCV: 97.2 fL (ref 78.0–100.0)
RDW: 17.2 % — ABNORMAL HIGH (ref 11.5–15.5)
WBC: 13.9 10*3/uL — ABNORMAL HIGH (ref 4.0–10.5)

## 2013-06-15 LAB — PROTIME-INR
INR: 2.62 — ABNORMAL HIGH (ref 0.00–1.49)
Prothrombin Time: 26.7 seconds — ABNORMAL HIGH (ref 11.6–15.2)

## 2013-06-15 LAB — LEGIONELLA ANTIGEN, URINE

## 2013-06-15 MED ORDER — DIGOXIN 0.25 MG/ML IJ SOLN
0.5000 mg | Freq: Once | INTRAMUSCULAR | Status: AC
  Administered 2013-06-15: 0.5 mg via INTRAVENOUS
  Filled 2013-06-15: qty 2

## 2013-06-15 MED ORDER — WARFARIN SODIUM 1 MG PO TABS
1.0000 mg | ORAL_TABLET | Freq: Once | ORAL | Status: AC
  Administered 2013-06-15: 1 mg via ORAL
  Filled 2013-06-15: qty 1

## 2013-06-15 MED ORDER — DILTIAZEM HCL 100 MG IV SOLR
5.0000 mg/h | INTRAVENOUS | Status: DC
  Administered 2013-06-15: 10 mg/h via INTRAVENOUS
  Administered 2013-06-15: 5 mg/h via INTRAVENOUS
  Administered 2013-06-16: 10 mg/h via INTRAVENOUS
  Filled 2013-06-15: qty 100

## 2013-06-15 MED ORDER — DIGOXIN 0.25 MG/ML IJ SOLN
0.2500 mg | Freq: Once | INTRAMUSCULAR | Status: AC
  Administered 2013-06-15: 0.25 mg via INTRAVENOUS
  Filled 2013-06-15: qty 2

## 2013-06-15 MED ORDER — FUROSEMIDE 10 MG/ML IJ SOLN
20.0000 mg | Freq: Once | INTRAMUSCULAR | Status: AC
  Administered 2013-06-15: 20 mg via INTRAVENOUS
  Filled 2013-06-15: qty 2

## 2013-06-15 MED ORDER — DIGOXIN 250 MCG PO TABS
0.2500 mg | ORAL_TABLET | Freq: Every day | ORAL | Status: DC
  Administered 2013-06-16 – 2013-06-18 (×3): 0.25 mg via ORAL
  Filled 2013-06-15 (×4): qty 1

## 2013-06-15 NOTE — Progress Notes (Signed)
ANTICOAGULATION CONSULT NOTE  Pharmacy Consult for Warfarin Indication: atrial fibrillation  Allergies  Allergen Reactions  . Cymbalta (Duloxetine Hcl) Other (See Comments)    Confusion   . Procaine Hcl Hives and Other (See Comments)    NOVOCAINE: Sweating, Confusion, Not in right state of mind.    Patient Measurements: Height: 6\' 1"  (185.4 cm) Weight: 233 lb 14.5 oz (106.1 kg) IBW/kg (Calculated) : 79.9  Vital Signs: Temp: 98.3 F (36.8 C) (06/21 0738) Temp src: Oral (06/21 0738) BP: 107/66 mmHg (06/21 0628) Pulse Rate: 98 (06/21 0628)  Labs:  Recent Labs  06/13/13 1428 06/14/13 0435 06/14/13 0925 06/15/13 0510  HGB 13.6 12.5*  --  11.5*  HCT 41.9 38.9*  --  35.0*  PLT 169 165  --  133*  APTT  --   --  50*  --   LABPROT 20.3* 22.6* 23.0* 26.7*  INR 1.81* 2.09* 2.14* 2.62*  CREATININE 1.04 1.50*  --  1.11  TROPONINI <0.30  --  <0.30  --     Estimated Creatinine Clearance: 69 ml/min (by C-G formula based on Cr of 1.11).   Medical History: Past Medical History  Diagnosis Date  . COPD (chronic obstructive pulmonary disease)     Uses occasional nighttime O2  . Disseminated herpes zoster 2010  . GERD (gastroesophageal reflux disease)   . Asthma   . Pulmonary embolus     2003 and 2011  . Pulmonary nodules      Chest CT 09/11  . Mixed hyperlipidemia   . Atrial fibrillation     CHADS2 score 2  . Lupus anticoagulant positive   . Hypertension   . Gall stones   . Rheumatoid arthritis(714.0)   . History of chicken pox 1941; 2011  . Anemia   . Chronic lower back pain   . Edema     Evaluated by Dr. Diona Browner - felt more likely component of cor pulmonale due to chronic lung disease (EF  60-65%, grade 2 diastolic dysfunction by echo 01/2012)  . Cirrhosis     questionable, AFP normal Feb 01/2012, hx of ETOH use     Medications:  Prescriptions prior to admission  Medication Sig Dispense Refill  . cetirizine (ZYRTEC) 10 MG tablet Take 10 mg by mouth daily.         . cloNIDine (CATAPRES) 0.1 MG tablet Take 0.1 mg by mouth 2 (two) times daily.        Marland Kitchen diltiazem (CARTIA XT) 240 MG 24 hr capsule Take 1 capsule (240 mg total) by mouth at bedtime.  30 capsule  11  . ferrous gluconate (FERGON) 325 MG tablet Take 325 mg by mouth daily with breakfast.      . folic acid (FOLVITE) 1 MG tablet Take 1 mg by mouth daily.       . furosemide (LASIX) 40 MG tablet Take 40 mg by mouth 2 (two) times daily.       Marland Kitchen gabapentin (NEURONTIN) 300 MG capsule Take 300 mg by mouth 2 (two) times daily.       . methotrexate (RHEUMATREX) 2.5 MG tablet Take 2.5 mg by mouth once a week. *TAKE 5 TABLETS BY MOUTH FOR A TOTAL 12.5MG  WEEKLY ON FRIDAYS*      . Omega-3 Fatty Acids (FISH OIL) 1000 MG CAPS Take 1 capsule by mouth 2 (two) times daily.       Marland Kitchen omeprazole (PRILOSEC) 20 MG capsule Take 20 mg by mouth 2 (two) times daily.       Marland Kitchen  oxyCODONE-acetaminophen (PERCOCET) 10-325 MG per tablet Take 1 tablet by mouth every 6 (six) hours as needed for pain.      . potassium chloride SA (K-DUR,KLOR-CON) 20 MEQ tablet Take 20 mEq by mouth 2 (two) times daily.        Marland Kitchen warfarin (COUMADIN) 2 MG tablet Take 4.5 mg by mouth daily. Patient Takes two and one-half tablets daily (dose = 4.5mg ).      . albuterol (PROAIR HFA) 108 (90 BASE) MCG/ACT inhaler Inhale 2 puffs into the lungs every 6 (six) hours as needed. Shortness of Breath      . albuterol (PROVENTIL) (2.5 MG/3ML) 0.083% nebulizer solution Take 2.5 mg by nebulization every 6 (six) hours as needed. Shortness of Breath        Assessment: 79 yo M on chronic warfarin 4.5mg  daily for hx multiple PE.  INR slightly below goal on admission but within goal range today after big jump overnight.   He was admitted with infectious process & has ARF which can make him more sensitive to warfarin.  He was also started on amiodarone 6/20. No bleeding noted.   Goal of Therapy:  INR 2-3   Plan:  Warfarin 1mg  po x 1 today Daily PT/INR  Elson Clan 06/15/2013,8:20 AM

## 2013-06-15 NOTE — Progress Notes (Signed)
[facility] PROGRESS NOTE  [name] ZOX:[id number] DOB: 31-Jan-1931 DOA: 06/13/2013 PCP: Josue Hector, MD  Assessment/Plan: 1. Sepsis due to pneumonia. Patient is on Rocephin and azithromycin. We will continue careful hydration with his history of diastolic congestive heart failure. Blood cultures have been sent and have shown no growth to date. We'll also send sputum culture. PICC line was placed yesterday and CVP readings have mostly been in goal range. 2. Community-acquired pneumonia. Patient is on antibiotics and pulmonary hygiene. Leukocytosis is improving and he is afebrile. 3. Acute respiratory failure. Oxygen has been weaned down to nasal cannula and he appears to be tolerating this.  Continue to wean as tolerated. 4. COPD. No wheezing at this time. Continue as needed bronchodilators. 5. Atrial fibrillation with ventricular response. Likely secondary to underlying acute illness. Patient was initially started on a Cardizem infusion but became very hypotensive and this had to be discontinued. His blood pressures were running on the lower side with a systolic in the 90s. He has been started on an amiodarone infusion and cardiology is following. His heart rate remains uncontrolled in the 130s.  His renal function is better today, so we will try the patient on digoxin.  If this does not help in controlling his heart rate, then we may need to try low dose cardizem infusion as tolerated. He is anticoagulated with Coumadin. 6. Acute renal failure. Likely due to ATN from hypotension. Likely also has an element of contrast-induced nephropathy. Creatinine is better today and urine output is adequate.  Continue to monitor.  Code Status: DO NOT RESUSCITATE. I discussed the option for chest compressions, defibrillation, intubation. The patient reports that he would not want these measures taken, but would wish to receive available treatments to that point. Family Communication: discussed  with patient Disposition Plan: To be determined   Consultants:  none  Procedures:  None  Antibiotics:  Rocephin 6/19  Azithromycin 6/19  HPI/Subjective: Reports his breathing is better today.  He is not significantly coughing, but was able to produce a sputum sample for culture.  Objective: Filed Vitals:   06/15/13 8119 06/15/13 0628 06/15/13 0738 06/15/13 0750  BP:  107/66    Pulse: 127 98    Temp:   98.3 F (36.8 C)   TempSrc:   Oral   Resp: 27 20    Height:      Weight:      SpO2: 96% 96%  96%    Intake/Output Summary (Last 24 hours) at 06/15/13 0915 Last data filed at 06/15/13 0600  Gross per 24 hour  Intake 1848.58 ml  Output   1500 ml  Net 348.58 ml   Filed Weights   06/13/13 2058 06/14/13 0500 06/15/13 0500  Weight: 101.5 kg (223 lb 12.3 oz) 102.5 kg (225 lb 15.5 oz) 106.1 kg (233 lb 14.5 oz)    Exam:   General:  NAD, does not appear to have increased work of breathing, able to carry on conversation, alert and oriented, mentation intact  Cardiovascular: tachycardic, irregular  Respiratory: crackles at bases, R>L  Abdomen: soft, distended, non tender, bs present  Musculoskeletal: trace edema b/l   Data Reviewed: Basic Metabolic Panel:  Recent Labs Lab 06/13/13 1428 06/14/13 0435 06/15/13 0510  NA 140 139 139  K 3.8 4.2 3.6  CL 104 100 103  CO2 28 28 29   GLUCOSE 119* 85 105*  BUN 12 17 13   CREATININE 1.04 1.50* 1.11  CALCIUM 9.4 8.6 8.7   Liver Function Tests:  Recent Labs Lab 06/13/13 1428 06/14/13 0435 06/15/13 0510  AST 17 16 15   ALT 10 8 8   ALKPHOS 58 59 65  BILITOT 2.2* 2.0* 0.7  PROT 7.9 7.0 6.8  ALBUMIN 3.6 3.1* 2.8*    Recent Labs Lab 06/13/13 1428  LIPASE 21   No results found for this basename: AMMONIA,  in the last 168 hours CBC:  Recent Labs Lab 06/13/13 1428 06/14/13 0435 06/15/13 0510  WBC 14.5* 21.5* 13.9*  NEUTROABS 11.4*  --   --   HGB 13.6 12.5* 11.5*  HCT 41.9 38.9* 35.0*  MCV 96.3  98.2 97.2  PLT 169 165 133*   Cardiac Enzymes:  Recent Labs Lab 06/13/13 1428 06/14/13 0925  TROPONINI <0.30 <0.30   BNP (last 3 results)  Recent Labs  06/13/13 1428  PROBNP 1637.0*   CBG: No results found for this basename: GLUCAP,  in the last 168 hours  Recent Results (from the past 240 hour(s))  CULTURE, BLOOD (ROUTINE X 2)     Status: None   Collection Time    06/13/13  6:51 PM      Result Value Range Status   Specimen Description BLOOD LEFT ANTECUBITAL   Final   Special Requests BOTTLES DRAWN AEROBIC AND ANAEROBIC 10CC   Final   Culture NO GROWTH 2 DAYS   Final   Report Status PENDING   Incomplete  CULTURE, BLOOD (ROUTINE X 2)     Status: None   Collection Time    06/13/13  7:03 PM      Result Value Range Status   Specimen Description BLOOD LEFT HAND   Final   Special Requests BOTTLES DRAWN AEROBIC AND ANAEROBIC 6CC   Final   Culture NO GROWTH 2 DAYS   Final   Report Status PENDING   Incomplete  MRSA PCR SCREENING     Status: Abnormal   Collection Time    06/13/13  8:51 PM      Result Value Range Status   MRSA by PCR POSITIVE (*) NEGATIVE Final   Comment:            The GeneXpert MRSA Assay (FDA     approved for NASAL specimens     only), is one component of a     comprehensive MRSA colonization     surveillance program. It is not     intended to diagnose MRSA     infection nor to guide or     monitor treatment for     MRSA infections.     RESULT CALLED TO, READ BACK BY AND VERIFIED WITH:     KINNEY,N ON 06/13/13 AT 2300 BY LOY,C  CULTURE, EXPECTORATED SPUTUM-ASSESSMENT     Status: None   Collection Time    06/15/13  5:22 AM      Result Value Range Status   Specimen Description SPUTUM   Final   Special Requests NONE   Final   Sputum evaluation     Final   Value: THIS SPECIMEN IS ACCEPTABLE. RESPIRATORY CULTURE REPORT TO FOLLOW.   Report Status 06/15/2013 FINAL   Final     Studies: Ct Angio Chest Pe W/cm &/or Wo Cm  06/13/2013   *RADIOLOGY  REPORT*  Clinical Data:  ABDOMINAL PAIN, CHEST PAIN, SHORTNESS OF BREATH.  CT ANGIOGRAPHY CHEST CT ABDOMEN AND PELVIS WITH CONTRAST  Technique:  Multidetector CT imaging of the chest was performed using the standard protocol during bolus administration of intravenous contrast.  Multiplanar CT image  reconstructions including MIPs were obtained to evaluate the vascular anatomy. Multidetector CT imaging of the abdomen and pelvis was performed using the standard protocol during bolus administration of intravenous contrast.  Contrast: 50mL OMNIPAQUE IOHEXOL 300 MG/ML  SOLN, OMNIPAQUE IOHEXOL 350 MG/ML SOLN  Comparison:  05/21/2012  CTA CHEST  Findings:  There is good contrast opacification of the pulmonary artery branches.  No discrete filling defect to suggest acute PE.Breathing during the acquisition degrades some of the images. Adequate contrast opacification of the thoracic aorta with no evidence of dissection, aneurysm, or stenosis. There is classic 3- vessel brachiocephalic arch anatomy.  Coronary and aortic calcifications.  No pleural or pericardial effusion.  New right hilar adenopathy up to   20 mm transverse diameter.  New subcarinal adenopathy up to 14 mm short axis diameter.  Sub centimeter prevascular and AP window  nodes.  12 mm enlarged pretracheal node and  10 mm right paratracheal node on image 21/4. Multiple small subpleural blebs noted in the upper lobes.  9-mm lingular nodule image 49/5 stable since 03/29/2011.  Smaller subpleural nodule is degraded by breathing motion.  6 mm nodule laterally in the right upper lobe image 22/5 is new since prior 03/29/2011.  There are new coarse airspace opacities in the posterior, medial, and lateral basal segments left lower lobe, medial and posterior basal segments right lower lobe with air bronchograms evident.  Old T4 and T12 compression fracture deformities.  Sternum intact.  Review of the MIP images confirms the above findings.  IMPRESSION: 1.  Negative  for acute PE or thoracic aortic dissection. 2.  Airspace infiltrates in both lower lobes, left worse than right, suggesting pneumonia more likely than atypical edema or neoplasm.  3. New right hilar and mediastinal adenopathy is nonspecific, possibly reactive although follow-up recommended for appropriate resolution. 4.  Stable lingular pulmonary nodules.  New 6 mm right upper lobe pulmonary nodule. 5. Atherosclerosis, including . coronary artery disease. Please note that although the presence of coronary artery calcium documents the presence of coronary artery disease, the severity of this disease and any potential stenosis cannot be assessed on this non-gated CT examination.  Assessment for potential risk factor modification, dietary therapy or pharmacologic therapy may be warranted, if clinically indicated.  CT ABDOMEN AND PELVIS  Findings: Stable small hepatic cysts.  No new hepatic lesion. Vascular clips in the gallbladder fossa.  Scattered calcified granulomas in the spleen.  Unremarkable adrenal glands and pancreas.  Stable small bilateral renal cysts.  No hydronephrosis. Aortoiliac arterial calcifications.  Small hiatal hernia.  Stomach, small bowel, and colon are nondilated.  Normal appendix.  Urinary bladder physiologically distended.  Mild prostatic enlargement.  No ascites.  No free air.  No adenopathy.  Portal vein patent. Changes of instrumented PLIF L4-5.  Stable mild compression fracture deformities L1, L2, L3.  Spondylitic changes L3-4 and L5- S1. 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  Review of the MIP images confirms the above findings.  IMPRESSION:  1.  No acute abdominal process. 2.  Small hiatal hernia. 3.  Stable chronic and postoperative changes as above.   Original Report Authenticated By: D. Andria Rhein, MD   Ct Abdomen Pelvis W Contrast  06/13/2013   *RADIOLOGY REPORT*  Clinical Data:  ABDOMINAL PAIN, CHEST PAIN, SHORTNESS OF BREATH.  CT ANGIOGRAPHY CHEST CT ABDOMEN AND PELVIS WITH CONTRAST   Technique:  Multidetector CT imaging of the chest was performed using the standard protocol during bolus administration of intravenous contrast.  Multiplanar CT  image reconstructions including MIPs were obtained to evaluate the vascular anatomy. Multidetector CT imaging of the abdomen and pelvis was performed using the standard protocol during bolus administration of intravenous contrast.  Contrast: 50mL OMNIPAQUE IOHEXOL 300 MG/ML  SOLN, OMNIPAQUE IOHEXOL 350 MG/ML SOLN  Comparison:  05/21/2012  CTA CHEST  Findings:  There is good contrast opacification of the pulmonary artery branches.  No discrete filling defect to suggest acute PE.Breathing during the acquisition degrades some of the images. Adequate contrast opacification of the thoracic aorta with no evidence of dissection, aneurysm, or stenosis. There is classic 3- vessel brachiocephalic arch anatomy.  Coronary and aortic calcifications.  No pleural or pericardial effusion.  New right hilar adenopathy up to   20 mm transverse diameter.  New subcarinal adenopathy up to 14 mm short axis diameter.  Sub centimeter prevascular and AP window  nodes.  12 mm enlarged pretracheal node and  10 mm right paratracheal node on image 21/4. Multiple small subpleural blebs noted in the upper lobes.  9-mm lingular nodule image 49/5 stable since 03/29/2011.  Smaller subpleural nodule is degraded by breathing motion.  6 mm nodule laterally in the right upper lobe image 22/5 is new since prior 03/29/2011.  There are new coarse airspace opacities in the posterior, medial, and lateral basal segments left lower lobe, medial and posterior basal segments right lower lobe with air bronchograms evident.  Old T4 and T12 compression fracture deformities.  Sternum intact.  Review of the MIP images confirms the above findings.  IMPRESSION: 1.  Negative for acute PE or thoracic aortic dissection. 2.  Airspace infiltrates in both lower lobes, left worse than right, suggesting pneumonia  more likely than atypical edema or neoplasm.  3. New right hilar and mediastinal adenopathy is nonspecific, possibly reactive although follow-up recommended for appropriate resolution. 4.  Stable lingular pulmonary nodules.  New 6 mm right upper lobe pulmonary nodule. 5. Atherosclerosis, including . coronary artery disease. Please note that although the presence of coronary artery calcium documents the presence of coronary artery disease, the severity of this disease and any potential stenosis cannot be assessed on this non-gated CT examination.  Assessment for potential risk factor modification, dietary therapy or pharmacologic therapy may be warranted, if clinically indicated.  CT ABDOMEN AND PELVIS  Findings: Stable small hepatic cysts.  No new hepatic lesion. Vascular clips in the gallbladder fossa.  Scattered calcified granulomas in the spleen.  Unremarkable adrenal glands and pancreas.  Stable small bilateral renal cysts.  No hydronephrosis. Aortoiliac arterial calcifications.  Small hiatal hernia.  Stomach, small bowel, and colon are nondilated.  Normal appendix.  Urinary bladder physiologically distended.  Mild prostatic enlargement.  No ascites.  No free air.  No adenopathy.  Portal vein patent. Changes of instrumented PLIF L4-5.  Stable mild compression fracture deformities L1, L2, L3.  Spondylitic changes L3-4 and L5- S1. 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  Review of the MIP images confirms the above findings.  IMPRESSION:  1.  No acute abdominal process. 2.  Small hiatal hernia. 3.  Stable chronic and postoperative changes as above.   Original Report Authenticated By: D. Andria Rhein, MD   Dg Chest Port 1 View  06/15/2013   *RADIOLOGY REPORT*  Clinical Data: Follow up pneumonia, sepsis  PORTABLE CHEST - 1 VIEW  Comparison: 06/14/2013  Findings: Irregularly thickened interstitial markings, and hazy lung base airspace type opacity are essentially stable.  Lung base opacity is consistent with bilateral  infiltrates, atelectasis or a  combination.   The irregularly thickened interstitial markings may all be chronic or reflect a component of interstitial edema.  No pneumothorax.  Right PICC is stable with its tip in the lower superior vena cava.  IMPRESSION: No significant change from previous day's study.  Persistent lung base airspace opacity consistent with bilateral infiltrates, atelectasis or a combination.  Stable diffusely thickened interstitial markings.  Consider interstitial edema in the proper clinical setting.   Original Report Authenticated By: Amie Portland, M.D.   Dg Chest Portable 1 View  06/14/2013   *RADIOLOGY REPORT*  Clinical Data: Sepsis  PORTABLE CHEST - 1 VIEW  Comparison: 06/13/2013  Findings: The cardiac shadow is stable.  Bilateral interstitial changes are again identified and stable from prior study.  These are most noted in the bases bilaterally.  No new focal infiltrate is seen.  IMPRESSION: Stable appearance of the chest from previous day.   Original Report Authenticated By: Alcide Clever, M.D.   Dg Chest Port 1 View  06/13/2013   *RADIOLOGY REPORT*  Clinical Data: Short of breath  PORTABLE CHEST - 1 VIEW  Comparison: Prior chest x-ray 10/12/2012  Findings: Pulmonary vascular congestion with mild interstitial edema.  Patchy bibasilar opacities appears similar to prior and likely reflects chronic changes and atelectasis.  Stable cardiomegaly.  Atherosclerotic calcifications noted in the transverse aorta.  There may be small bilateral pleural effusions. Stable granulomatous nodule in the lingula.  IMPRESSION:  1. Pulmonary vascular congestion with diffuse bilateral interstitial and airspace opacities most consistent with mild pulmonary edema.  Atypical infection could have a similar appearance. 2.  Stable cardiomegaly. 3.  Stable granulomatous nodules in the lingula. 4.  Background COPD and emphysema.   Original Report Authenticated By: Malachy Moan, M.D.   Dg Chest Port 1v Same  Day  06/14/2013   *RADIOLOGY REPORT*  Clinical Data: Repositioned PICC line  PORTABLE CHEST - 1 VIEW SAME DAY  Comparison: 06/14/2013 1040 hours  Findings: The heart and pulmonary vascularity are stable.  Stable interstitial changes are seen.  The PICC line has been repositioned and now lies at the distal superior vena cava.  IMPRESSION: Successfully repositioned PICC line.  The remainder of the exam is stable from previous study.   Original Report Authenticated By: Alcide Clever, M.D.   Dg Chest Port 1v Same Day  06/14/2013   *RADIOLOGY REPORT*  Clinical Data: PICC line placement  PORTABLE CHEST - 1 VIEW SAME DAY  Comparison: Earlier same day; 06/13/2013; chest CT of 06/13/2013  Findings:  Interval placement of right upper extremity approach PICC line whose tip projects over the contralateral left innominate vein.  Grossly unchanged enlarged cardiac silhouette and mediastinal contours.  Atherosclerotic calcifications within the thoracic aorta.  Lung volumes remain reduced with grossly unchanged bibasilar heterogeneous coarse heterogeneous reticular opacities, left greater than right.  Trace bilateral effusions are not excluded.  No definite evidence of edema.  No pneumothorax. Vascular calcifications overlie the bilateral carotid bulbs.  IMPRESSION: 1.  Malpositioned right upper extremity approach PICC line with tip projected over the contralateral left innominate vein. Repositioning is advised. 2.  Grossly unchanged hypoventilation and bibasilar coarse reticular opacities, left greater than right, worrisome for multifocal infection.  This was made a call report.   Original Report Authenticated By: Tacey Ruiz, MD    Scheduled Meds: . azithromycin  500 mg Intravenous Q24H  . cefTRIAXone (ROCEPHIN)  IV  1 g Intravenous Q24H  . Chlorhexidine Gluconate Cloth  6 each Topical Q0600  . digoxin  0.25 mg Intravenous Once  . digoxin  0.5 mg Intravenous Once  . [START ON 06/16/2013] digoxin  0.25 mg Oral Daily  .  gabapentin  300 mg Oral BID  . guaiFENesin  1,200 mg Oral BID  . methotrexate  12.5 mg Oral Weekly  . mupirocin ointment  1 application Nasal BID  . pantoprazole  40 mg Oral Daily  . sodium chloride  10-40 mL Intracatheter Q12H  . sodium chloride  3 mL Intravenous Q12H  . warfarin  1 mg Oral ONCE-1800  . Warfarin - Pharmacist Dosing Inpatient   Does not apply Q24H   Continuous Infusions: . sodium chloride 20 mL/hr at 06/15/13 0600  . amiodarone (NEXTERONE PREMIX) 360 mg/200 mL dextrose 30 mg/hr (06/15/13 0819)  . diltiazem (CARDIZEM) infusion      Principal Problem:   CAP (community acquired pneumonia) Active Problems:   Atrial fibrillation with RVR   COPD (chronic obstructive pulmonary disease)   Long term current use of anticoagulant   Obesity (BMI 35.0-39.9 without comorbidity)   Hypoxemia   Acute renal failure   Acute respiratory failure   Sepsis(995.91)    Time spent: Critical care time:    [facility]  [facility] Pager [phone]. If 7PM-7AM, please contact night-coverage at [email or web address], password [facility] 06/15/2013, 9:15 AM  LOS: 2 days

## 2013-06-16 DIAGNOSIS — J449 Chronic obstructive pulmonary disease, unspecified: Secondary | ICD-10-CM

## 2013-06-16 LAB — CBC
Hemoglobin: 11.1 g/dL — ABNORMAL LOW (ref 13.0–17.0)
Platelets: 155 10*3/uL (ref 150–400)
RBC: 3.45 MIL/uL — ABNORMAL LOW (ref 4.22–5.81)
WBC: 10.8 10*3/uL — ABNORMAL HIGH (ref 4.0–10.5)

## 2013-06-16 LAB — URINE CULTURE

## 2013-06-16 LAB — PROTIME-INR
INR: 2.75 — ABNORMAL HIGH (ref 0.00–1.49)
Prothrombin Time: 27.7 seconds — ABNORMAL HIGH (ref 11.6–15.2)

## 2013-06-16 LAB — BASIC METABOLIC PANEL
CO2: 27 mEq/L (ref 19–32)
Glucose, Bld: 206 mg/dL — ABNORMAL HIGH (ref 70–99)
Potassium: 3.2 mEq/L — ABNORMAL LOW (ref 3.5–5.1)
Sodium: 137 mEq/L (ref 135–145)

## 2013-06-16 MED ORDER — AZITHROMYCIN 250 MG PO TABS
500.0000 mg | ORAL_TABLET | Freq: Every day | ORAL | Status: DC
  Administered 2013-06-16 – 2013-06-17 (×2): 500 mg via ORAL
  Filled 2013-06-16 (×2): qty 2

## 2013-06-16 MED ORDER — WARFARIN SODIUM 1 MG PO TABS
1.0000 mg | ORAL_TABLET | Freq: Once | ORAL | Status: AC
  Administered 2013-06-16: 1 mg via ORAL
  Filled 2013-06-16: qty 1

## 2013-06-16 MED ORDER — POTASSIUM CHLORIDE CRYS ER 20 MEQ PO TBCR
40.0000 meq | EXTENDED_RELEASE_TABLET | ORAL | Status: AC
  Administered 2013-06-16 (×2): 40 meq via ORAL
  Filled 2013-06-16 (×2): qty 2

## 2013-06-16 MED ORDER — DILTIAZEM HCL 60 MG PO TABS
60.0000 mg | ORAL_TABLET | Freq: Four times a day (QID) | ORAL | Status: DC
  Administered 2013-06-16 – 2013-06-17 (×4): 60 mg via ORAL
  Filled 2013-06-16 (×4): qty 1

## 2013-06-16 MED ORDER — OXYCODONE-ACETAMINOPHEN 5-325 MG PO TABS
2.0000 | ORAL_TABLET | Freq: Four times a day (QID) | ORAL | Status: DC | PRN
  Administered 2013-06-16 (×3): 2 via ORAL
  Filled 2013-06-16 (×3): qty 2

## 2013-06-16 MED ORDER — FUROSEMIDE 10 MG/ML IJ SOLN
40.0000 mg | Freq: Once | INTRAMUSCULAR | Status: AC
  Administered 2013-06-16: 40 mg via INTRAVENOUS
  Filled 2013-06-16: qty 4

## 2013-06-16 MED ORDER — OXYCODONE HCL 5 MG PO TABS
5.0000 mg | ORAL_TABLET | Freq: Four times a day (QID) | ORAL | Status: DC | PRN
  Administered 2013-06-16 (×2): 5 mg via ORAL
  Filled 2013-06-16 (×2): qty 1

## 2013-06-16 NOTE — Progress Notes (Addendum)
ANTICOAGULATION CONSULT NOTE  Pharmacy Consult for Warfarin Indication: atrial fibrillation  Allergies  Allergen Reactions  . Cymbalta (Duloxetine Hcl) Other (See Comments)    Confusion   . Procaine Hcl Hives and Other (See Comments)    NOVOCAINE: Sweating, Confusion, Not in right state of mind.    Patient Measurements: Height: 6\' 1"  (185.4 cm) Weight: 229 lb 11.5 oz (104.2 kg) IBW/kg (Calculated) : 79.9  Vital Signs: Temp: 99.4 F (37.4 C) (06/22 0740) Temp src: Oral (06/22 0740) BP: 131/67 mmHg (06/22 0700) Pulse Rate: 58 (06/22 0700)  Labs:  Recent Labs  06/13/13 1428 06/14/13 0435 06/14/13 0925 06/15/13 0510 06/16/13 0538  HGB 13.6 12.5*  --  11.5* 11.1*  HCT 41.9 38.9*  --  35.0* 33.6*  PLT 169 165  --  133* 155  APTT  --   --  50*  --   --   LABPROT 20.3* 22.6* 23.0* 26.7* 27.7*  INR 1.81* 2.09* 2.14* 2.62* 2.75*  CREATININE 1.04 1.50*  --  1.11 1.04  TROPONINI <0.30  --  <0.30  --   --     Estimated Creatinine Clearance: 73 ml/min (by C-G formula based on Cr of 1.04).   Medical History: Past Medical History  Diagnosis Date  . COPD (chronic obstructive pulmonary disease)     Uses occasional nighttime O2  . Disseminated herpes zoster 2010  . GERD (gastroesophageal reflux disease)   . Asthma   . Pulmonary embolus     2003 and 2011  . Pulmonary nodules      Chest CT 09/11  . Mixed hyperlipidemia   . Atrial fibrillation     CHADS2 score 2  . Lupus anticoagulant positive   . Hypertension   . Gall stones   . Rheumatoid arthritis(714.0)   . History of chicken pox 1941; 2011  . Anemia   . Chronic lower back pain   . Edema     Evaluated by Dr. Diona Browner - felt more likely component of cor pulmonale due to chronic lung disease (EF  60-65%, grade 2 diastolic dysfunction by echo 01/2012)  . Cirrhosis     questionable, AFP normal Feb 01/2012, hx of ETOH use     Medications:  Prescriptions prior to admission  Medication Sig Dispense Refill  .  cetirizine (ZYRTEC) 10 MG tablet Take 10 mg by mouth daily.        . cloNIDine (CATAPRES) 0.1 MG tablet Take 0.1 mg by mouth 2 (two) times daily.        Marland Kitchen diltiazem (CARTIA XT) 240 MG 24 hr capsule Take 1 capsule (240 mg total) by mouth at bedtime.  30 capsule  11  . ferrous gluconate (FERGON) 325 MG tablet Take 325 mg by mouth daily with breakfast.      . folic acid (FOLVITE) 1 MG tablet Take 1 mg by mouth daily.       . furosemide (LASIX) 40 MG tablet Take 40 mg by mouth 2 (two) times daily.       Marland Kitchen gabapentin (NEURONTIN) 300 MG capsule Take 300 mg by mouth 2 (two) times daily.       . methotrexate (RHEUMATREX) 2.5 MG tablet Take 2.5 mg by mouth once a week. *TAKE 5 TABLETS BY MOUTH FOR A TOTAL 12.5MG  WEEKLY ON FRIDAYS*      . Omega-3 Fatty Acids (FISH OIL) 1000 MG CAPS Take 1 capsule by mouth 2 (two) times daily.       Marland Kitchen omeprazole (PRILOSEC) 20 MG  capsule Take 20 mg by mouth 2 (two) times daily.       Marland Kitchen oxyCODONE-acetaminophen (PERCOCET) 10-325 MG per tablet Take 1 tablet by mouth every 6 (six) hours as needed for pain.      . potassium chloride SA (K-DUR,KLOR-CON) 20 MEQ tablet Take 20 mEq by mouth 2 (two) times daily.        Marland Kitchen warfarin (COUMADIN) 2 MG tablet Take 4.5 mg by mouth daily. Patient Takes two and one-half tablets daily (dose = 4.5mg ).      . albuterol (PROAIR HFA) 108 (90 BASE) MCG/ACT inhaler Inhale 2 puffs into the lungs every 6 (six) hours as needed. Shortness of Breath      . albuterol (PROVENTIL) (2.5 MG/3ML) 0.083% nebulizer solution Take 2.5 mg by nebulization every 6 (six) hours as needed. Shortness of Breath        Assessment: 79 yo M on chronic warfarin 4.5mg  daily for hx multiple PE.  INR slightly below goal on admission but within goal range today.  He had a big jump in INR on home warfarin dose, so dose was reduced & INR stable overnight.    He was admitted with infectious process on Rocephin/Zithromax & ARF which can make him more sensitive to warfarin.  No bleeding  noted.  He was also started on amiodarone 06/14/13 so will require maintenance warfarin dose reduction in ~ 2-3 weeks if this continues.  Recommend close outpatient monitoring at discharge to facilitate this dose reduction.    Goal of Therapy:  INR 2-3   Plan:  Repeat Warfarin 1mg  po x 1 today Daily PT/INR Change Zithromax to po per P&T policy (see policy below)  Linsey Arteaga, [facility] 06/16/2013,8:47 AM  PHARMACIST - PHYSICIAN COMMUNICATION CONCERNING: Antibiotic IV to Oral Route Change Policy  RECOMMENDATION: This patient is receiving Zithromax by the intravenous route.  Based on criteria approved by the Pharmacy and Therapeutics Committee, the antibiotic(s) is/are being converted to the equivalent oral dose form(s).   DESCRIPTION: These criteria include:  Patient being treated for a respiratory tract infection, urinary tract infection, or cellulitis  The patient is not neutropenic and does not exhibit a GI malabsorption state  The patient is eating (either orally or via tube) and/or has been taking other orally administered medications for a least 24 hours  The patient is improving clinically and has a Tmax < 100.5  If you have questions about this conversion, please contact the Pharmacy Department  [x]   [phone] )  [facility] []   [phone] )  [facility]  []   [phone] )  [facility] []   [phone] )  [facility]

## 2013-06-16 NOTE — Progress Notes (Signed)
[facility] PROGRESS NOTE  [name] ZOX:[id number] DOB: 07/31/1931 DOA: 06/13/2013 PCP: Josue Hector, MD  Assessment/Plan: 1. Sepsis due to pneumonia. Patient is on Rocephin and azithromycin. Blood cultures have been sent and have shown no growth to date. We'll also send sputum culture. PICC line was placed and CVP readings have been running higher than goal range (18).  Will give one dose of lasix today. 2. Community-acquired pneumonia. Patient is on antibiotics and pulmonary hygiene. Leukocytosis is improving and he is afebrile. 3. Acute respiratory failure. Oxygen has been weaned down to nasal cannula and he appears to be tolerating this.  Continue to wean as tolerated. 4. COPD. No wheezing at this time. Continue as needed bronchodilators. 5. Atrial fibrillation with ventricular response. Likely secondary to underlying acute illness. Patient was initially started on a Cardizem infusion but became very hypotensive and this had to be discontinued. His blood pressures were running on the lower side with a systolic in the 90s. He was then started on an amiodarone infusion and cardiology is following. His heart rate remained uncontrolled in the 130s.  He was subsequently started on digoxin and since blood pressure is better, he was started back on cardizem infusion.  His heart rate is lower now, but still uncontrolled.  Will try to transition to oral cardizem and wean off cardizem infusion.  If heart rate is still uncontrolled, we can try adding oral lopressor if blood pressure will tolerate. He is anticoagulated with Coumadin. 6. Acute renal failure. Likely due to ATN from hypotension. Likely also has an element of contrast-induced nephropathy. Creatinine is better today and urine output is adequate.  Continue to monitor.  Code Status: DO NOT RESUSCITATE. I discussed the option for chest compressions, defibrillation, intubation. The patient reports that he would not want these  measures taken, but would wish to receive available treatments to that point. Family Communication: discussed with patient Disposition Plan: remain in SDU today.  Discharge home once improved.   Consultants:  none  Procedures:  None  Antibiotics:  Rocephin 6/19  Azithromycin 6/19  HPI/Subjective: Having some back pain and headache today.  Feels breathing is improving.  Objective: Filed Vitals:   06/16/13 0532 06/16/13 0600 06/16/13 0700 06/16/13 0740  BP:  128/56 131/67   Pulse:  100 58   Temp:    99.4 F (37.4 C)  TempSrc:    Oral  Resp:  17 16   Height:      Weight:      SpO2: 97% 95% 95%     Intake/Output Summary (Last 24 hours) at 06/16/13 0909 Last data filed at 06/16/13 0817  Gross per 24 hour  Intake 1905.1 ml  Output   1125 ml  Net  780.1 ml   Filed Weights   06/14/13 0500 06/15/13 0500 06/16/13 0500  Weight: 102.5 kg (225 lb 15.5 oz) 106.1 kg (233 lb 14.5 oz) 104.2 kg (229 lb 11.5 oz)    Exam:   General:  NAD, does not appear to have increased work of breathing, able to carry on conversation, alert and oriented, mentation intact  Cardiovascular: tachycardic, irregular  Respiratory: diminished breath sounds but clear  Abdomen: soft, distended, non tender, bs present  Musculoskeletal: trace edema b/l   Data Reviewed: Basic Metabolic Panel:  Recent Labs Lab 06/13/13 1428 06/14/13 0435 06/15/13 0510 06/16/13 0538  NA 140 139 139 137  K 3.8 4.2 3.6 3.2*  CL 104 100 103 100  CO2 28 28 29 27   GLUCOSE  119* 85 105* 206*  BUN 12 17 13 11   CREATININE 1.04 1.50* 1.11 1.04  CALCIUM 9.4 8.6 8.7 8.3*   Liver Function Tests:  Recent Labs Lab 06/13/13 1428 06/14/13 0435 06/15/13 0510  AST 17 16 15   ALT 10 8 8   ALKPHOS 58 59 65  BILITOT 2.2* 2.0* 0.7  PROT 7.9 7.0 6.8  ALBUMIN 3.6 3.1* 2.8*    Recent Labs Lab 06/13/13 1428  LIPASE 21   No results found for this basename: AMMONIA,  in the last 168 hours CBC:  Recent Labs Lab  06/13/13 1428 06/14/13 0435 06/15/13 0510 06/16/13 0538  WBC 14.5* 21.5* 13.9* 10.8*  NEUTROABS 11.4*  --   --   --   HGB 13.6 12.5* 11.5* 11.1*  HCT 41.9 38.9* 35.0* 33.6*  MCV 96.3 98.2 97.2 97.4  PLT 169 165 133* 155   Cardiac Enzymes:  Recent Labs Lab 06/13/13 1428 06/14/13 0925  TROPONINI <0.30 <0.30   BNP (last 3 results)  Recent Labs  06/13/13 1428 06/15/13 0930  PROBNP 1637.0* 1593.0*   CBG: No results found for this basename: GLUCAP,  in the last 168 hours  Recent Results (from the past 240 hour(s))  CULTURE, BLOOD (ROUTINE X 2)     Status: None   Collection Time    06/13/13  6:51 PM      Result Value Range Status   Specimen Description BLOOD LEFT ANTECUBITAL   Final   Special Requests BOTTLES DRAWN AEROBIC AND ANAEROBIC 10CC   Final   Culture NO GROWTH 3 DAYS   Final   Report Status PENDING   Incomplete  CULTURE, BLOOD (ROUTINE X 2)     Status: None   Collection Time    06/13/13  7:03 PM      Result Value Range Status   Specimen Description BLOOD LEFT HAND   Final   Special Requests BOTTLES DRAWN AEROBIC AND ANAEROBIC 6CC   Final   Culture NO GROWTH 3 DAYS   Final   Report Status PENDING   Incomplete  MRSA PCR SCREENING     Status: Abnormal   Collection Time    06/13/13  8:51 PM      Result Value Range Status   MRSA by PCR POSITIVE (*) NEGATIVE Final   Comment:            The GeneXpert MRSA Assay (FDA     approved for NASAL specimens     only), is one component of a     comprehensive MRSA colonization     surveillance program. It is not     intended to diagnose MRSA     infection nor to guide or     monitor treatment for     MRSA infections.     RESULT CALLED TO, READ BACK BY AND VERIFIED WITH:     KINNEY,N ON 06/13/13 AT 2300 BY LOY,C  CULTURE, EXPECTORATED SPUTUM-ASSESSMENT     Status: None   Collection Time    06/15/13  5:22 AM      Result Value Range Status   Specimen Description SPUTUM   Final   Special Requests NONE   Final    Sputum evaluation     Final   Value: THIS SPECIMEN IS ACCEPTABLE. RESPIRATORY CULTURE REPORT TO FOLLOW.   Report Status 06/15/2013 FINAL   Final     Studies: Dg Chest Port 1 View  06/15/2013   *RADIOLOGY REPORT*  Clinical Data: Follow up pneumonia, sepsis  PORTABLE CHEST - 1 VIEW  Comparison: 06/14/2013  Findings: Irregularly thickened interstitial markings, and hazy lung base airspace type opacity are essentially stable.  Lung base opacity is consistent with bilateral infiltrates, atelectasis or a combination.   The irregularly thickened interstitial markings may all be chronic or reflect a component of interstitial edema.  No pneumothorax.  Right PICC is stable with its tip in the lower superior vena cava.  IMPRESSION: No significant change from previous day's study.  Persistent lung base airspace opacity consistent with bilateral infiltrates, atelectasis or a combination.  Stable diffusely thickened interstitial markings.  Consider interstitial edema in the proper clinical setting.   Original Report Authenticated By: Amie Portland, M.D.   Dg Chest Port 1v Same Day  06/14/2013   *RADIOLOGY REPORT*  Clinical Data: Repositioned PICC line  PORTABLE CHEST - 1 VIEW SAME DAY  Comparison: 06/14/2013 1040 hours  Findings: The heart and pulmonary vascularity are stable.  Stable interstitial changes are seen.  The PICC line has been repositioned and now lies at the distal superior vena cava.  IMPRESSION: Successfully repositioned PICC line.  The remainder of the exam is stable from previous study.   Original Report Authenticated By: Alcide Clever, M.D.   Dg Chest Port 1v Same Day  06/14/2013   *RADIOLOGY REPORT*  Clinical Data: PICC line placement  PORTABLE CHEST - 1 VIEW SAME DAY  Comparison: Earlier same day; 06/13/2013; chest CT of 06/13/2013  Findings:  Interval placement of right upper extremity approach PICC line whose tip projects over the contralateral left innominate vein.  Grossly unchanged enlarged  cardiac silhouette and mediastinal contours.  Atherosclerotic calcifications within the thoracic aorta.  Lung volumes remain reduced with grossly unchanged bibasilar heterogeneous coarse heterogeneous reticular opacities, left greater than right.  Trace bilateral effusions are not excluded.  No definite evidence of edema.  No pneumothorax. Vascular calcifications overlie the bilateral carotid bulbs.  IMPRESSION: 1.  Malpositioned right upper extremity approach PICC line with tip projected over the contralateral left innominate vein. Repositioning is advised. 2.  Grossly unchanged hypoventilation and bibasilar coarse reticular opacities, left greater than right, worrisome for multifocal infection.  This was made a call report.   Original Report Authenticated By: Tacey Ruiz, MD    Scheduled Meds: . azithromycin  500 mg Oral QHS  . cefTRIAXone (ROCEPHIN)  IV  1 g Intravenous Q24H  . Chlorhexidine Gluconate Cloth  6 each Topical Q0600  . digoxin  0.25 mg Oral Daily  . diltiazem  60 mg Oral Q6H  . furosemide  40 mg Intravenous Once  . gabapentin  300 mg Oral BID  . guaiFENesin  1,200 mg Oral BID  . methotrexate  12.5 mg Oral Weekly  . mupirocin ointment  1 application Nasal BID  . pantoprazole  40 mg Oral Daily  . sodium chloride  10-40 mL Intracatheter Q12H  . sodium chloride  3 mL Intravenous Q12H  . warfarin  1 mg Oral ONCE-1800  . Warfarin - Pharmacist Dosing Inpatient   Does not apply Q24H   Continuous Infusions: . sodium chloride 20 mL/hr at 06/16/13 0601  . amiodarone (NEXTERONE PREMIX) 360 mg/200 mL dextrose 30 mg/hr (06/16/13 0601)  . diltiazem (CARDIZEM) infusion 10 mg/hr (06/16/13 0601)    Principal Problem:   CAP (community acquired pneumonia) Active Problems:   Atrial fibrillation with RVR   COPD (chronic obstructive pulmonary disease)   Long term current use of anticoagulant   Obesity (BMI 35.0-39.9 without comorbidity)   Hypoxemia  Acute renal failure   Acute  respiratory failure   Sepsis(995.91)    Time spent:    [facility]  [facility] Pager [phone]. If 7PM-7AM, please contact night-coverage at [email or web address], password [facility] 06/16/2013, 9:09 AM  LOS: 3 days

## 2013-06-17 ENCOUNTER — Inpatient Hospital Stay (HOSPITAL_COMMUNITY): Payer: Medicare Other

## 2013-06-17 DIAGNOSIS — I359 Nonrheumatic aortic valve disorder, unspecified: Secondary | ICD-10-CM

## 2013-06-17 LAB — PROTIME-INR
INR: 2.46 — ABNORMAL HIGH (ref 0.00–1.49)
Prothrombin Time: 25.5 seconds — ABNORMAL HIGH (ref 11.6–15.2)

## 2013-06-17 LAB — BASIC METABOLIC PANEL
CO2: 31 mEq/L (ref 19–32)
Calcium: 8.8 mg/dL (ref 8.4–10.5)
Chloride: 103 mEq/L (ref 96–112)
GFR calc Af Amer: 78 mL/min — ABNORMAL LOW (ref >90)
Sodium: 140 mEq/L (ref 135–145)

## 2013-06-17 LAB — CBC
MCV: 97.8 fL (ref 78.0–100.0)
Platelets: 170 10*3/uL (ref 150–400)
RBC: 3.57 MIL/uL — ABNORMAL LOW (ref 4.22–5.81)
RDW: 16.5 % — ABNORMAL HIGH (ref 11.5–15.5)
WBC: 9.5 10*3/uL (ref 4.0–10.5)

## 2013-06-17 MED ORDER — WARFARIN SODIUM 1 MG PO TABS
1.0000 mg | ORAL_TABLET | Freq: Once | ORAL | Status: AC
  Administered 2013-06-17: 1 mg via ORAL
  Filled 2013-06-17: qty 1

## 2013-06-17 MED ORDER — PREDNISONE 20 MG PO TABS
40.0000 mg | ORAL_TABLET | Freq: Every day | ORAL | Status: DC
  Administered 2013-06-17 – 2013-06-18 (×2): 40 mg via ORAL
  Filled 2013-06-17 (×2): qty 2

## 2013-06-17 MED ORDER — DILTIAZEM HCL 60 MG PO TABS
90.0000 mg | ORAL_TABLET | Freq: Four times a day (QID) | ORAL | Status: DC
  Administered 2013-06-17 (×3): 90 mg via ORAL
  Filled 2013-06-17 (×3): qty 1

## 2013-06-17 NOTE — Progress Notes (Signed)
Primary cardiologist: Dr. Nona Dell  SUBJECTIVE: Feeling better. Coughing nonproductively. No chest pain.  LABS: Basic Metabolic Panel:  Recent Labs  16/10/96 0538 06/17/13 0428  NA 137 140  K 3.2* 4.0  CL 100 103  CO2 27 31  GLUCOSE 206* 99  BUN 11 9  CREATININE 1.04 1.03  CALCIUM 8.3* 8.8   Liver Function Tests:  Recent Labs  06/15/13 0510  AST 15  ALT 8  ALKPHOS 65  BILITOT 0.7  PROT 6.8  ALBUMIN 2.8*   CBC:  Recent Labs  06/16/13 0538 06/17/13 0428  WBC 10.8* 9.5  HGB 11.1* 11.2*  HCT 33.6* 34.9*  MCV 97.4 97.8  PLT 155 170   Cardiac Enzymes:  Recent Labs  06/14/13 0925  TROPONINI <0.30   Echocardiogram: 02/15/2012 Left ventricle: The cavity size was normal. Wall thickness was increased in a pattern of mild LVH. Systolic function was normal. The estimated ejection fraction was in the range of 60% to 65%. Wall motion was normal; there were no regional wall motion abnormalities. Features are consistent with a pseudonormal left ventricular filling pattern, with concomitant abnormal relaxation and increased filling pressure (grade 2 diastolic dysfunction). Doppler parameters are consistent with elevated mean left atrial filling pressure. - Aortic valve: Valve area: 2.25cm^2(VTI). Valve area: 1.98cm^2 (Vmax). - Mitral valve: Moderately calcified annulus. Moderately thickened, mildly calcified leaflets . Valve area by continuity equation (using LVOT flow): 1.99cm^2. - Atrial septum: No defect or patent foramen ovale was identified.   RADIOLOGY: Dg Chest Port 1 View  06/17/2013   *RADIOLOGY REPORT*  Clinical Data: Short of breath.  Follow-up chest x-ray.  PORTABLE CHEST - 1 VIEW  Comparison: 6.12/2012.  Findings: Right upper extremity PICC appears unchanged. Monitoring leads are projected over the chest.  Cardiopericardial silhouette and mediastinal contours are also unchanged allowing for apical lordotic projection.  Slight worsening  airspace disease at the left lung base.  Right basilar airspace disease appears similar.  Right costophrenic angle excluded from view.  IMPRESSION:  1.  Stable support apparatus. 2.  Slight increase and left basilar airspace disease which may represent pulmonary edema or infection.   Original Report Authenticated By: Andreas Newport, M.D.    PHYSICAL EXAM BP 139/66  Pulse 118  Temp(Src) 98.6 F (37 C) (Axillary)  Resp 20  Ht 6\' 1"  (1.854 m)  Wt 232 lb 9.4 oz (105.5 kg)  BMI 30.69 kg/m2  SpO2 94% General: No acute distress Lungs: Diminished breath sounds with inspiratory wheezes, frequent non-productive coughing with inspiration. On O2.  Heart: HRRR irregular, distant, S1 S2, Abdomen: Bowel sounds are positive and hypoactive, mild distention. Extremities: No edema.  DP +1 Neuro: Alert and oriented X 3.  TELEMETRY: Atial fibrillation 90's to low 100's.   ASSESSMENT AND PLAN:  1. Persistent atrial fibrillation:  Rate is better controlled in the 90s to low 100s, he continues on amiodarone drip at 30 mg an hour, (started on 6/21 2014 at 2 PM) and has been transitioned to by mouth Cardizem 90 mg every 6 hours. He has also been started on digoxin 0.25 mg daily. He continues on anticoagulation with Coumadin.  2. COPD: Exacerbation with pneumonia and sepsis. He continues on antibiotics and steroids. Almost certainly contributing to atrial fibrillation.  3. CAD: Based on CT of the chest 6/19 demonstrating evidence of coronary atherosclerosis. Cardiac markers argue against ACS.  4. Diastolic CHF:  Do not see that he is on any diuretic at this time. Home meds have him on  Lasix 40 mg daily. Creatinine is 1.03. Chest x-ray on 06/17/2013 revealing slight increase in left basilar airspace disease which may represent pulmonary edema or infection. Currently being treated with antibiotics in the setting of pneumonia. Echocardiogram is planned.   [name]. Lyman Bishop NP [facility] 06/17/2013,  9:22 AM   Attending note:  Patient seen and examined. Modified above note by Ms. Lawrence NP. Reviewed consultation by Dr. Daleen Squibb from last week. Patient is slowly improving. Heart rate control is adequate in the face of associated pulmonary illness. Amiodarone infusion has been used for rate control, no plan for cardioversion however. Otherwise he is on oral Cardizem and digoxin, continues on anticoagulation with Coumadin. Would favor discontinuing amiodarone as this point and try to work toward in all oral regimen. Would generally not plan to use oral amiodarone long-term in this patient with his lung disease. Followup echocardiogram pending.  Jonelle Sidle, M.D., F.A.C.C.

## 2013-06-17 NOTE — Progress Notes (Signed)
*  PRELIMINARY RESULTS* Echocardiogram 2D Echocardiogram has been performed.  [name] 06/17/2013, 1:58 PM

## 2013-06-17 NOTE — Progress Notes (Signed)
ANTICOAGULATION CONSULT NOTE  Pharmacy Consult for Warfarin Indication: atrial fibrillation  Allergies  Allergen Reactions  . Cymbalta (Duloxetine Hcl) Other (See Comments)    Confusion   . Procaine Hcl Hives and Other (See Comments)    NOVOCAINE: Sweating, Confusion, Not in right state of mind.    Patient Measurements: Height: 6\' 1"  (185.4 cm) Weight: 232 lb 9.4 oz (105.5 kg) IBW/kg (Calculated) : 79.9  Vital Signs: Temp: 98.5 F (36.9 C) (06/23 1124) Temp src: Axillary (06/23 0400) BP: 139/66 mmHg (06/23 0800) Pulse Rate: 118 (06/23 0800)  Labs:  Recent Labs  06/15/13 0510 06/16/13 0538 06/17/13 0428  HGB 11.5* 11.1* 11.2*  HCT 35.0* 33.6* 34.9*  PLT 133* 155 170  LABPROT 26.7* 27.7* 25.5*  INR 2.62* 2.75* 2.46*  CREATININE 1.11 1.04 1.03    Estimated Creatinine Clearance: 74.1 ml/min (by C-G formula based on Cr of 1.03).   Medical History: Past Medical History  Diagnosis Date  . COPD (chronic obstructive pulmonary disease)     Uses occasional nighttime O2  . Disseminated herpes zoster 2010  . GERD (gastroesophageal reflux disease)   . Asthma   . Pulmonary embolus     2003 and 2011  . Pulmonary nodules      Chest CT 09/11  . Mixed hyperlipidemia   . Atrial fibrillation     CHADS2 score 2  . Lupus anticoagulant positive   . Hypertension   . Gall stones   . Rheumatoid arthritis(714.0)   . History of chicken pox 1941; 2011  . Anemia   . Chronic lower back pain   . Edema     Evaluated by Dr. Diona Browner - felt more likely component of cor pulmonale due to chronic lung disease (EF  60-65%, grade 2 diastolic dysfunction by echo 01/2012)  . Cirrhosis     questionable, AFP normal Feb 01/2012, hx of ETOH use     Medications:  Prescriptions prior to admission  Medication Sig Dispense Refill  . cetirizine (ZYRTEC) 10 MG tablet Take 10 mg by mouth daily.        . cloNIDine (CATAPRES) 0.1 MG tablet Take 0.1 mg by mouth 2 (two) times daily.        Marland Kitchen  diltiazem (CARTIA XT) 240 MG 24 hr capsule Take 1 capsule (240 mg total) by mouth at bedtime.  30 capsule  11  . ferrous gluconate (FERGON) 325 MG tablet Take 325 mg by mouth daily with breakfast.      . folic acid (FOLVITE) 1 MG tablet Take 1 mg by mouth daily.       . furosemide (LASIX) 40 MG tablet Take 40 mg by mouth 2 (two) times daily.       Marland Kitchen gabapentin (NEURONTIN) 300 MG capsule Take 300 mg by mouth 2 (two) times daily.       . methotrexate (RHEUMATREX) 2.5 MG tablet Take 2.5 mg by mouth once a week. *TAKE 5 TABLETS BY MOUTH FOR A TOTAL 12.5MG  WEEKLY ON FRIDAYS*      . Omega-3 Fatty Acids (FISH OIL) 1000 MG CAPS Take 1 capsule by mouth 2 (two) times daily.       Marland Kitchen omeprazole (PRILOSEC) 20 MG capsule Take 20 mg by mouth 2 (two) times daily.       Marland Kitchen oxyCODONE-acetaminophen (PERCOCET) 10-325 MG per tablet Take 1 tablet by mouth every 6 (six) hours as needed for pain.      . potassium chloride SA (K-DUR,KLOR-CON) 20 MEQ tablet Take 20 mEq  by mouth 2 (two) times daily.        Marland Kitchen warfarin (COUMADIN) 2 MG tablet Take 4.5 mg by mouth daily. Patient Takes two and one-half tablets daily (dose = 4.5mg ).      . albuterol (PROAIR HFA) 108 (90 BASE) MCG/ACT inhaler Inhale 2 puffs into the lungs every 6 (six) hours as needed. Shortness of Breath      . albuterol (PROVENTIL) (2.5 MG/3ML) 0.083% nebulizer solution Take 2.5 mg by nebulization every 6 (six) hours as needed. Shortness of Breath        Assessment: 79 yo M on chronic warfarin 4.5mg  daily for hx multiple PE.  INR slightly below goal on admission but within goal range today.  He had a big jump in INR on home warfarin dose, so dose was reduced & INR stable overnight.    He was admitted with infectious process on Rocephin/Zithromax & ARF which can make him more sensitive to warfarin.  No bleeding noted.  He was also started on amiodarone 06/14/13 so will require maintenance warfarin dose reduction in ~ 2-3 weeks if this continues.  Recommend close  outpatient monitoring at discharge to facilitate this dose reduction.    Goal of Therapy:  INR 2-3   Plan:  Repeat Warfarin 1mg  po x 1 today Daily PT/INR Change Zithromax to po per P&T policy (see policy below)  Valrie Hart A 06/17/2013,11:26 AM  PHARMACIST - PHYSICIAN COMMUNICATION CONCERNING: Antibiotic IV to Oral Route Change Policy  RECOMMENDATION: This patient is receiving Zithromax by the intravenous route.  Based on criteria approved by the Pharmacy and Therapeutics Committee, the antibiotic(s) is/are being converted to the equivalent oral dose form(s).   DESCRIPTION: These criteria include:  Patient being treated for a respiratory tract infection, urinary tract infection, or cellulitis  The patient is not neutropenic and does not exhibit a GI malabsorption state  The patient is eating (either orally or via tube) and/or has been taking other orally administered medications for a least 24 hours  The patient is improving clinically and has a Tmax < 100.5  If you have questions about this conversion, please contact the Pharmacy Department  [x]   [phone] )  [facility] []   [phone] )  [facility]  []   [phone] )  [facility] []   [phone] )  [facility]

## 2013-06-17 NOTE — Progress Notes (Signed)
[name] HQI:[id number] DOB: [birth year] DOA: 06/13/2013 PCP: Josue Hector, MD   Subjective: This man says he feels somewhat better today with his breathing. He still remains tachycardic in nature fibrillation. He is requiring 3 L oxygen per minute. Normally, at home, he is on 2 L per minute. He quit smoking in 1998.           Physical Exam: Blood pressure 130/65, pulse 94, temperature 98.6 F (37 C), temperature source Axillary, resp. rate 13, height 6\' 1"  (1.854 m), weight 105.5 kg (232 lb 9.4 oz), SpO2 97.00%. He does not appear to have increased work of breathing at rest. Lung fields show bilateral wheezing. There are just a few basal crackles. He does not appear to be clinically in heart failure. He does have resting tachycardia and atrial fibrillation. He is alert and orientated.   Investigations:  Recent Results (from the past 240 hour(s))  CULTURE, BLOOD (ROUTINE X 2)     Status: None   Collection Time    06/13/13  6:51 PM      Result Value Range Status   Specimen Description BLOOD LEFT ANTECUBITAL   Final   Special Requests BOTTLES DRAWN AEROBIC AND ANAEROBIC 10CC   Final   Culture NO GROWTH 3 DAYS   Final   Report Status PENDING   Incomplete  CULTURE, BLOOD (ROUTINE X 2)     Status: None   Collection Time    06/13/13  7:03 PM      Result Value Range Status   Specimen Description BLOOD LEFT HAND   Final   Special Requests BOTTLES DRAWN AEROBIC AND ANAEROBIC 6CC   Final   Culture NO GROWTH 3 DAYS   Final   Report Status PENDING   Incomplete  MRSA PCR SCREENING     Status: Abnormal   Collection Time    06/13/13  8:51 PM      Result Value Range Status   MRSA by PCR POSITIVE (*) NEGATIVE Final   Comment:            The GeneXpert MRSA Assay (FDA     approved for NASAL specimens     only), is one component of a     comprehensive MRSA colonization     surveillance program. It is not     intended to diagnose MRSA     infection nor to guide or      monitor treatment for     MRSA infections.     RESULT CALLED TO, READ BACK BY AND VERIFIED WITH:     KINNEY,N ON 06/13/13 AT 2300 BY LOY,C  URINE CULTURE     Status: None   Collection Time    06/14/13  4:25 PM      Result Value Range Status   Specimen Description URINE, CLEAN CATCH   Final   Special Requests NONE   Final   Culture  Setup Time 06/15/2013 01:11   Final   Colony Count NO GROWTH   Final   Culture NO GROWTH   Final   Report Status 06/16/2013 FINAL   Final  CULTURE, EXPECTORATED SPUTUM-ASSESSMENT     Status: None   Collection Time    06/15/13  5:22 AM      Result Value Range Status   Specimen Description SPUTUM   Final   Special Requests NONE   Final   Sputum evaluation     Final   Value: THIS SPECIMEN IS ACCEPTABLE. RESPIRATORY CULTURE REPORT TO FOLLOW.  Report Status 06/15/2013 FINAL   Final  CULTURE, RESPIRATORY (NON-EXPECTORATED)     Status: None   Collection Time    06/15/13  5:22 AM      Result Value Range Status   Specimen Description SPUTUM   Final   Special Requests NONE   Final   Gram Stain     Final   Value: MODERATE WBC PRESENT, PREDOMINANTLY PMN     FEW SQUAMOUS EPITHELIAL CELLS PRESENT     FEW GRAM POSITIVE COCCI IN PAIRS     IN CLUSTERS   Culture Culture reincubated for better growth   Final   Report Status PENDING   Incomplete     Basic Metabolic Panel:  Recent Labs  45/40/98 0538 06/17/13 0428  NA 137 140  K 3.2* 4.0  CL 100 103  CO2 27 31  GLUCOSE 206* 99  BUN 11 9  CREATININE 1.04 1.03  CALCIUM 8.3* 8.8   Liver Function Tests:  Recent Labs  06/15/13 0510  AST 15  ALT 8  ALKPHOS 65  BILITOT 0.7  PROT 6.8  ALBUMIN 2.8*     CBC:  Recent Labs  06/16/13 0538 06/17/13 0428  WBC 10.8* 9.5  HGB 11.1* 11.2*  HCT 33.6* 34.9*  MCV 97.4 97.8  PLT 155 170    Dg Chest Port 1 View  06/15/2013   *RADIOLOGY REPORT*  Clinical Data: Follow up pneumonia, sepsis  PORTABLE CHEST - 1 VIEW  Comparison: 06/14/2013  Findings:  Irregularly thickened interstitial markings, and hazy lung base airspace type opacity are essentially stable.  Lung base opacity is consistent with bilateral infiltrates, atelectasis or a combination.   The irregularly thickened interstitial markings may all be chronic or reflect a component of interstitial edema.  No pneumothorax.  Right PICC is stable with its tip in the lower superior vena cava.  IMPRESSION: No significant change from previous day's study.  Persistent lung base airspace opacity consistent with bilateral infiltrates, atelectasis or a combination.  Stable diffusely thickened interstitial markings.  Consider interstitial edema in the proper clinical setting.   Original Report Authenticated By: Amie Portland, M.D.      Medications: I have reviewed the patient's current medications.  Impression: 1. Community-acquired pneumonia with sepsis, improving. Leukocytosis resolved. 2. COPD with exacerbation. 3. Atrial fibrillation with rapid ventricular response, currently on Cardizem and amiodarone drip. He is also on digoxin  orally and Cardizem orally. Chronic anticoagulation. 4. Acute renal failure, resolved. Likely due to ATN.     Plan: 1. Add prednisone 40 mg daily for his bronchospasm component. 2. Continue with current therapy, increase oral Cardizem to 90 mg 4 times a day. Try to wean off Cardizem drip. Cardiology recommendations for further management will be useful.  Consultants:  Cardiology, Dr. wall.   Procedures:  None.   Antibiotics:  Rocephin intravenously started 06/14/2013.  Zithromax intravenously started 06/16/2013.                   Code Status: DO NOT RESUSCITATE.  Family Communication: Discussed plan with patient at the bedside.   Disposition Plan: Home when medically stable.  Time spent: 20 minutes.   LOS: 4 days   Wilson Singer Pager [phone]  06/17/2013, 7:53 AM

## 2013-06-18 DIAGNOSIS — I10 Essential (primary) hypertension: Secondary | ICD-10-CM

## 2013-06-18 LAB — COMPREHENSIVE METABOLIC PANEL
ALT: 9 U/L (ref 0–53)
AST: 14 U/L (ref 0–37)
Albumin: 2.7 g/dL — ABNORMAL LOW (ref 3.5–5.2)
Alkaline Phosphatase: 53 U/L (ref 39–117)
CO2: 31 mEq/L (ref 19–32)
Chloride: 104 mEq/L (ref 96–112)
Creatinine, Ser: 0.93 mg/dL (ref 0.50–1.35)
GFR calc non Af Amer: 78 mL/min — ABNORMAL LOW (ref >90)
Potassium: 4.1 mEq/L (ref 3.5–5.1)
Sodium: 142 mEq/L (ref 135–145)
Total Bilirubin: 0.5 mg/dL (ref 0.3–1.2)

## 2013-06-18 LAB — CBC
MCV: 97.7 fL (ref 78.0–100.0)
Platelets: 170 10*3/uL (ref 150–400)
RBC: 3.47 MIL/uL — ABNORMAL LOW (ref 4.22–5.81)
RDW: 16.1 % — ABNORMAL HIGH (ref 11.5–15.5)
WBC: 9.4 10*3/uL (ref 4.0–10.5)

## 2013-06-18 LAB — PROTIME-INR: INR: 2.32 — ABNORMAL HIGH (ref 0.00–1.49)

## 2013-06-18 LAB — CULTURE, BLOOD (ROUTINE X 2): Culture: NO GROWTH

## 2013-06-18 LAB — CULTURE, RESPIRATORY W GRAM STAIN

## 2013-06-18 MED ORDER — AZITHROMYCIN 250 MG PO TABS: 250.0000 mg | ORAL_TABLET | Freq: Every day | ORAL | Status: DC

## 2013-06-18 MED ORDER — CEFUROXIME AXETIL 500 MG PO TABS: 500.0000 mg | ORAL_TABLET | Freq: Two times a day (BID) | ORAL | Status: DC

## 2013-06-18 MED ORDER — PREDNISONE 20 MG PO TABS: ORAL_TABLET | ORAL | Status: DC

## 2013-06-18 MED ORDER — DIGOXIN 250 MCG PO TABS: 0.2500 mg | ORAL_TABLET | Freq: Every day | ORAL | Status: DC

## 2013-06-18 MED ORDER — DILTIAZEM HCL ER COATED BEADS 180 MG PO CP24
300.0000 mg | ORAL_CAPSULE | Freq: Every day | ORAL | Status: DC
  Filled 2013-06-18: qty 1

## 2013-06-18 MED ORDER — DILTIAZEM HCL ER COATED BEADS 300 MG PO CP24: 300.0000 mg | ORAL_CAPSULE | Freq: Every day | ORAL | Status: DC

## 2013-06-18 NOTE — Discharge Summary (Signed)
Physician Discharge Summary  [name] ZOX:[id number] DOB: March 14, 1931 DOA: 06/13/2013  PCP: Josue Hector, MD  Admit date: 06/13/2013 Discharge date: 06/18/2013  Time spent: Greater than 30 minutes  Recommendations for Outpatient Follow-up:  1. Followup with primary care physician in 2 weeks. 2. Follow with cardiology in one week.   Discharge Diagnoses:  1. Community-acquired pneumonia with sepsis, improved. 2. COPD exacerbation. 3. Atrial fibrillation with rapid ventricular response, improved. Chronic anticoagulation. 4. Acute renal failure secondary to ATN, resolved.   Discharge Condition: Stable and improved.  Diet recommendation: Regular.  Filed Weights   06/16/13 0500 06/17/13 0500 06/18/13 0500  Weight: 104.2 kg (229 lb 11.5 oz) 105.5 kg (232 lb 9.4 oz) 105.4 kg (232 lb 5.8 oz)    History of present illness:  This 79 year old man presents to the hospital with symptoms of dyspnea and cough. Please see initial history as outlined below: HPI: [name] is a 79 y.o. male with a past medical history of atrial fibrillation, COPD, obesity, who was in his usual state of health a few days ago, when he says that he was sucking on ice cube when he choked on it. He felt as it was stuck in his air pipe. Subsequently started feeling short of breath, and this has been worsening over the last few days. He's had a dry cough. He has had difficulty sleeping. He denies any fever or chills at home. He has had sharp chest pains bilaterally 8/10 in intensity without any radiation. The pain worsens with deep breathing and cough. No real relieving factors. Denies any sick contacts. Denies any recent travel. He did vomit once in the ED. Has been having some dizziness or lightheadedness. Has been having some wheezing as well.  Hospital Course:  The patient was admitted and initially was hypotensive and had the picture of sepsis with acute renal failure. He was therefore hydrated  aggressively and given intravenous antibiotics for his community-acquired pneumonia. He also was found to have atrial fibrillation with rapid ventricular response likely secondary to underlying disease. This was controlled with a combination of intravenous amiodarone, intravenous Cardizem and digoxin orally. In the next couple of days this is improved as has his respiratory condition. His wheezing is improved and his ventricular rate has stabilized. He was seen by cardiology, Dr. Diona Browner, who agreed for discontinuation of IV Cardizem and IV amiodarone and to manage medications orally. Yesterday night his ventricular rate was very well controlled, even somewhat lower below 60 and this morning it is in the 80s and 90s and he is entirely stable. He feels better. He is now stable for discharge.  Procedures:  Echocardiogram:  ------------------------------------------------------------ Study Conclusions  - Left ventricle: The cavity size was normal. There was mild concentric hypertrophy. Systolic function was vigorous. The estimated ejection fraction was in the range of 70% to 75%. The study is not technically sufficient to allow evaluation of LV diastolic function. - Aortic valve: Trileaflet; mildly calcified leaflets. Cusp separation was mildly reduced. There was very mild stenosis. Mean gradient: 12mm Hg (S). - Mitral valve: Calcified annulus. Trivial regurgitation. - Left atrium: The atrium was moderately dilated. - Right ventricle: The cavity size was mildly dilated. Systolic function was normal. - Right atrium: Central venous pressure: 15mm Hg (est). - Tricuspid valve: Trivial regurgitation. - Pulmonary arteries: Systolic pressure could not be accurately estimated. - Pericardium, extracardiac: There was no pericardial effusion. Impressions:  - Compared to previous study February 2013 LV and RV function are stable. Diastolic function  not adequatedly assessed at this time - had prior  evidence of increased LA filling pressures. Mildly calcified aortic valve with very mild aortic stenosis. Transthoracic echocardiography. M-mode, complete 2D, spectral Doppler, and color Doppler. Height: Height: 185.4cm. Height: 73in. Weight: Weight: 105.2kg. Weight: 231.5lb. Body mass index: BMI: 30.6kg/m^2. Body surface area: BSA: 2.55m^2. Patient status: Inpatient. Location: ICU/CCU  Consultations:  Cardiology, Dr. Diona Browner.  Discharge Exam: Filed Vitals:   06/18/13 0200 06/18/13 0300 06/18/13 0400 06/18/13 0500  BP: 113/48 119/67 103/54 117/49  Pulse: 69 56 76 75  Temp:   98.5 F (36.9 C)   TempSrc:   Oral   Resp: 13 13 12 15   Height:      Weight:    105.4 kg (232 lb 5.8 oz)  SpO2: 95% 95% 97% 98%    General: He looks systemically well. He is not toxic or septic. There is no increased work of breathing at rest. There is no peripheral central cyanosis. Cardiovascular: Heart sounds are present and in atrial fibrillation, ventricular rate is relatively well-controlled and in light of his respiratory disease. He is not clinically in heart failure. Respiratory: Lung fields this morning are actually clear with very little wheezing, crackles. There is no bronchial breathing. He is alert and orientated.  Discharge Instructions  Discharge Orders   Future Appointments Provider Department Dept Phone   07/16/2013 10:20 AM Jonelle Sidle, MD [place] [facility] (near [place]) [phone]   Future Orders Complete By Expires     Diet - low sodium heart healthy  As directed     Increase activity slowly  As directed         Medication List    STOP taking these medications       cloNIDine 0.1 MG tablet  Commonly known as:  CATAPRES     furosemide 40 MG tablet  Commonly known as:  LASIX     potassium chloride SA 20 MEQ tablet  Commonly known as:  K-DUR,KLOR-CON      TAKE these medications       albuterol (2.5 MG/3ML) 0.083% nebulizer solution  Commonly known as:   PROVENTIL  Take 2.5 mg by nebulization every 6 (six) hours as needed. Shortness of Breath     PROAIR HFA 108 (90 BASE) MCG/ACT inhaler  Generic drug:  albuterol  Inhale 2 puffs into the lungs every 6 (six) hours as needed. Shortness of Breath     azithromycin 250 MG tablet  Commonly known as:  ZITHROMAX  Take 1 tablet (250 mg total) by mouth daily.     cefUROXime 500 MG tablet  Commonly known as:  CEFTIN  Take 1 tablet (500 mg total) by mouth 2 (two) times daily.     cetirizine 10 MG tablet  Commonly known as:  ZYRTEC  Take 10 mg by mouth daily.     digoxin 0.25 MG tablet  Commonly known as:  LANOXIN  Take 1 tablet (0.25 mg total) by mouth daily.     diltiazem 300 MG 24 hr capsule  Commonly known as:  CARTIA XT  Take 1 capsule (300 mg total) by mouth at bedtime.     ferrous gluconate 325 MG tablet  Commonly known as:  FERGON  Take 325 mg by mouth daily with breakfast.     Fish Oil 1000 MG Caps  Take 1 capsule by mouth 2 (two) times daily.     folic acid 1 MG tablet  Commonly known as:  FOLVITE  Take 1 mg by  mouth daily.     gabapentin 300 MG capsule  Commonly known as:  NEURONTIN  Take 300 mg by mouth 2 (two) times daily.     methotrexate 2.5 MG tablet  Commonly known as:  RHEUMATREX  Take 2.5 mg by mouth once a week. *TAKE 5 TABLETS BY MOUTH FOR A TOTAL 12.5MG  WEEKLY ON FRIDAYS*     omeprazole 20 MG capsule  Commonly known as:  PRILOSEC  Take 20 mg by mouth 2 (two) times daily.     oxyCODONE-acetaminophen 10-325 MG per tablet  Commonly known as:  PERCOCET  Take 1 tablet by mouth every 6 (six) hours as needed for pain.     predniSONE 20 MG tablet  Commonly known as:  DELTASONE  Take 2 tablets daily for 3 days, then 1 tablet daily for 3 days, then half tablet daily for 3 days, then STOP.     warfarin 2 MG tablet  Commonly known as:  COUMADIN  Take 4.5 mg by mouth daily. Patient Takes two and one-half tablets daily (dose = 4.5mg ).       Allergies   Allergen Reactions  . Cymbalta (Duloxetine Hcl) Other (See Comments)    Confusion   . Procaine Hcl Hives and Other (See Comments)    NOVOCAINE: Sweating, Confusion, Not in right state of mind.       Follow-up Information   Follow up with Josue Hector, MD. Schedule an appointment as soon as possible for a visit in 2 weeks.   Contact information:   [address] [place] [address] [phone]       Follow up with [employer], MD. Schedule an appointment as soon as possible for a visit in 1 week.   Contact information:   [address] [place] [address] [phone]        The results of significant diagnostics from this hospitalization (including imaging, microbiology, ancillary and laboratory) are listed below for reference.    Significant Diagnostic Studies: Ct Angio Chest Pe W/cm &/or Wo Cm  06/13/2013   *RADIOLOGY REPORT*  Clinical Data:  ABDOMINAL PAIN, CHEST PAIN, SHORTNESS OF BREATH.  CT ANGIOGRAPHY CHEST CT ABDOMEN AND PELVIS WITH CONTRAST  Technique:  Multidetector CT imaging of the chest was performed using the standard protocol during bolus administration of intravenous contrast.  Multiplanar CT image reconstructions including MIPs were obtained to evaluate the vascular anatomy. Multidetector CT imaging of the abdomen and pelvis was performed using the standard protocol during bolus administration of intravenous contrast.  Contrast: 50mL OMNIPAQUE IOHEXOL 300 MG/ML  SOLN, OMNIPAQUE IOHEXOL 350 MG/ML SOLN  Comparison:  05/21/2012  CTA CHEST  Findings:  There is good contrast opacification of the pulmonary artery branches.  No discrete filling defect to suggest acute PE.Breathing during the acquisition degrades some of the images. Adequate contrast opacification of the thoracic aorta with no evidence of dissection, aneurysm, or stenosis. There is classic 3- vessel brachiocephalic arch anatomy.  Coronary and aortic calcifications.  No pleural or  pericardial effusion.  New right hilar adenopathy up to   20 mm transverse diameter.  New subcarinal adenopathy up to 14 mm short axis diameter.  Sub centimeter prevascular and AP window  nodes.  12 mm enlarged pretracheal node and  10 mm right paratracheal node on image 21/4. Multiple small subpleural blebs noted in the upper lobes.  9-mm lingular nodule image 49/5 stable since 03/29/2011.  Smaller subpleural nodule is degraded by breathing motion.  6 mm nodule laterally in  the right upper lobe image 22/5 is new since prior 03/29/2011.  There are new coarse airspace opacities in the posterior, medial, and lateral basal segments left lower lobe, medial and posterior basal segments right lower lobe with air bronchograms evident.  Old T4 and T12 compression fracture deformities.  Sternum intact.  Review of the MIP images confirms the above findings.  IMPRESSION: 1.  Negative for acute PE or thoracic aortic dissection. 2.  Airspace infiltrates in both lower lobes, left worse than right, suggesting pneumonia more likely than atypical edema or neoplasm.  3. New right hilar and mediastinal adenopathy is nonspecific, possibly reactive although follow-up recommended for appropriate resolution. 4.  Stable lingular pulmonary nodules.  New 6 mm right upper lobe pulmonary nodule. 5. Atherosclerosis, including . coronary artery disease. Please note that although the presence of coronary artery calcium documents the presence of coronary artery disease, the severity of this disease and any potential stenosis cannot be assessed on this non-gated CT examination.  Assessment for potential risk factor modification, dietary therapy or pharmacologic therapy may be warranted, if clinically indicated.  CT ABDOMEN AND PELVIS  Findings: Stable small hepatic cysts.  No new hepatic lesion. Vascular clips in the gallbladder fossa.  Scattered calcified granulomas in the spleen.  Unremarkable adrenal glands and pancreas.  Stable small bilateral  renal cysts.  No hydronephrosis. Aortoiliac arterial calcifications.  Small hiatal hernia.  Stomach, small bowel, and colon are nondilated.  Normal appendix.  Urinary bladder physiologically distended.  Mild prostatic enlargement.  No ascites.  No free air.  No adenopathy.  Portal vein patent. Changes of instrumented PLIF L4-5.  Stable mild compression fracture deformities L1, L2, L3.  Spondylitic changes L3-4 and L5- S1. 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  Review of the MIP images confirms the above findings.  IMPRESSION:  1.  No acute abdominal process. 2.  Small hiatal hernia. 3.  Stable chronic and postoperative changes as above.   Original Report Authenticated By: D. Andria Rhein, MD   Ct Abdomen Pelvis W Contrast  06/13/2013   *RADIOLOGY REPORT*  Clinical Data:  ABDOMINAL PAIN, CHEST PAIN, SHORTNESS OF BREATH.  CT ANGIOGRAPHY CHEST CT ABDOMEN AND PELVIS WITH CONTRAST  Technique:  Multidetector CT imaging of the chest was performed using the standard protocol during bolus administration of intravenous contrast.  Multiplanar CT image reconstructions including MIPs were obtained to evaluate the vascular anatomy. Multidetector CT imaging of the abdomen and pelvis was performed using the standard protocol during bolus administration of intravenous contrast.  Contrast: 50mL OMNIPAQUE IOHEXOL 300 MG/ML  SOLN, OMNIPAQUE IOHEXOL 350 MG/ML SOLN  Comparison:  05/21/2012  CTA CHEST  Findings:  There is good contrast opacification of the pulmonary artery branches.  No discrete filling defect to suggest acute PE.Breathing during the acquisition degrades some of the images. Adequate contrast opacification of the thoracic aorta with no evidence of dissection, aneurysm, or stenosis. There is classic 3- vessel brachiocephalic arch anatomy.  Coronary and aortic calcifications.  No pleural or pericardial effusion.  New right hilar adenopathy up to   20 mm transverse diameter.  New subcarinal adenopathy up to 14 mm short axis  diameter.  Sub centimeter prevascular and AP window  nodes.  12 mm enlarged pretracheal node and  10 mm right paratracheal node on image 21/4. Multiple small subpleural blebs noted in the upper lobes.  9-mm lingular nodule image 49/5 stable since 03/29/2011.  Smaller subpleural nodule is degraded by breathing motion.  6 mm nodule laterally  in the right upper lobe image 22/5 is new since prior 03/29/2011.  There are new coarse airspace opacities in the posterior, medial, and lateral basal segments left lower lobe, medial and posterior basal segments right lower lobe with air bronchograms evident.  Old T4 and T12 compression fracture deformities.  Sternum intact.  Review of the MIP images confirms the above findings.  IMPRESSION: 1.  Negative for acute PE or thoracic aortic dissection. 2.  Airspace infiltrates in both lower lobes, left worse than right, suggesting pneumonia more likely than atypical edema or neoplasm.  3. New right hilar and mediastinal adenopathy is nonspecific, possibly reactive although follow-up recommended for appropriate resolution. 4.  Stable lingular pulmonary nodules.  New 6 mm right upper lobe pulmonary nodule. 5. Atherosclerosis, including . coronary artery disease. Please note that although the presence of coronary artery calcium documents the presence of coronary artery disease, the severity of this disease and any potential stenosis cannot be assessed on this non-gated CT examination.  Assessment for potential risk factor modification, dietary therapy or pharmacologic therapy may be warranted, if clinically indicated.  CT ABDOMEN AND PELVIS  Findings: Stable small hepatic cysts.  No new hepatic lesion. Vascular clips in the gallbladder fossa.  Scattered calcified granulomas in the spleen.  Unremarkable adrenal glands and pancreas.  Stable small bilateral renal cysts.  No hydronephrosis. Aortoiliac arterial calcifications.  Small hiatal hernia.  Stomach, small bowel, and colon are  nondilated.  Normal appendix.  Urinary bladder physiologically distended.  Mild prostatic enlargement.  No ascites.  No free air.  No adenopathy.  Portal vein patent. Changes of instrumented PLIF L4-5.  Stable mild compression fracture deformities L1, L2, L3.  Spondylitic changes L3-4 and L5- S1. 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  Review of the MIP images confirms the above findings.  IMPRESSION:  1.  No acute abdominal process. 2.  Small hiatal hernia. 3.  Stable chronic and postoperative changes as above.   Original Report Authenticated By: D. Andria Rhein, MD   Dg Chest Port 1 View  06/17/2013   *RADIOLOGY REPORT*  Clinical Data: Short of breath.  Follow-up chest x-ray.  PORTABLE CHEST - 1 VIEW  Comparison: 6.12/2012.  Findings: Right upper extremity PICC appears unchanged. Monitoring leads are projected over the chest.  Cardiopericardial silhouette and mediastinal contours are also unchanged allowing for apical lordotic projection.  Slight worsening airspace disease at the left lung base.  Right basilar airspace disease appears similar.  Right costophrenic angle excluded from view.  IMPRESSION:  1.  Stable support apparatus. 2.  Slight increase and left basilar airspace disease which may represent pulmonary edema or infection.   Original Report Authenticated By: Andreas Newport, M.D.   Dg Chest Port 1 View  06/15/2013   *RADIOLOGY REPORT*  Clinical Data: Follow up pneumonia, sepsis  PORTABLE CHEST - 1 VIEW  Comparison: 06/14/2013  Findings: Irregularly thickened interstitial markings, and hazy lung base airspace type opacity are essentially stable.  Lung base opacity is consistent with bilateral infiltrates, atelectasis or a combination.   The irregularly thickened interstitial markings may all be chronic or reflect a component of interstitial edema.  No pneumothorax.  Right PICC is stable with its tip in the lower superior vena cava.  IMPRESSION: No significant change from previous day's study.  Persistent lung  base airspace opacity consistent with bilateral infiltrates, atelectasis or a combination.  Stable diffusely thickened interstitial markings.  Consider interstitial edema in the proper clinical setting.   Original Report Authenticated By:  Amie Portland, M.D.   Dg Chest Portable 1 View  06/14/2013   *RADIOLOGY REPORT*  Clinical Data: Sepsis  PORTABLE CHEST - 1 VIEW  Comparison: 06/13/2013  Findings: The cardiac shadow is stable.  Bilateral interstitial changes are again identified and stable from prior study.  These are most noted in the bases bilaterally.  No new focal infiltrate is seen.  IMPRESSION: Stable appearance of the chest from previous day.   Original Report Authenticated By: Alcide Clever, M.D.   Dg Chest Port 1 View  06/13/2013   *RADIOLOGY REPORT*  Clinical Data: Short of breath  PORTABLE CHEST - 1 VIEW  Comparison: Prior chest x-ray 10/12/2012  Findings: Pulmonary vascular congestion with mild interstitial edema.  Patchy bibasilar opacities appears similar to prior and likely reflects chronic changes and atelectasis.  Stable cardiomegaly.  Atherosclerotic calcifications noted in the transverse aorta.  There may be small bilateral pleural effusions. Stable granulomatous nodule in the lingula.  IMPRESSION:  1. Pulmonary vascular congestion with diffuse bilateral interstitial and airspace opacities most consistent with mild pulmonary edema.  Atypical infection could have a similar appearance. 2.  Stable cardiomegaly. 3.  Stable granulomatous nodules in the lingula. 4.  Background COPD and emphysema.   Original Report Authenticated By: Malachy Moan, M.D.   Dg Chest Port 1v Same Day  06/14/2013   *RADIOLOGY REPORT*  Clinical Data: Repositioned PICC line  PORTABLE CHEST - 1 VIEW SAME DAY  Comparison: 06/14/2013 1040 hours  Findings: The heart and pulmonary vascularity are stable.  Stable interstitial changes are seen.  The PICC line has been repositioned and now lies at the distal superior vena  cava.  IMPRESSION: Successfully repositioned PICC line.  The remainder of the exam is stable from previous study.   Original Report Authenticated By: Alcide Clever, M.D.   Dg Chest Port 1v Same Day  06/14/2013   *RADIOLOGY REPORT*  Clinical Data: PICC line placement  PORTABLE CHEST - 1 VIEW SAME DAY  Comparison: Earlier same day; 06/13/2013; chest CT of 06/13/2013  Findings:  Interval placement of right upper extremity approach PICC line whose tip projects over the contralateral left innominate vein.  Grossly unchanged enlarged cardiac silhouette and mediastinal contours.  Atherosclerotic calcifications within the thoracic aorta.  Lung volumes remain reduced with grossly unchanged bibasilar heterogeneous coarse heterogeneous reticular opacities, left greater than right.  Trace bilateral effusions are not excluded.  No definite evidence of edema.  No pneumothorax. Vascular calcifications overlie the bilateral carotid bulbs.  IMPRESSION: 1.  Malpositioned right upper extremity approach PICC line with tip projected over the contralateral left innominate vein. Repositioning is advised. 2.  Grossly unchanged hypoventilation and bibasilar coarse reticular opacities, left greater than right, worrisome for multifocal infection.  This was made a call report.   Original Report Authenticated By: Tacey Ruiz, MD    Microbiology: Recent Results (from the past 240 hour(s))  CULTURE, BLOOD (ROUTINE X 2)     Status: None   Collection Time    06/13/13  6:51 PM      Result Value Range Status   Specimen Description BLOOD LEFT ANTECUBITAL   Final   Special Requests BOTTLES DRAWN AEROBIC AND ANAEROBIC 10CC   Final   Culture NO GROWTH 4 DAYS   Final   Report Status PENDING   Incomplete  CULTURE, BLOOD (ROUTINE X 2)     Status: None   Collection Time    06/13/13  7:03 PM      Result Value Range Status  Specimen Description BLOOD LEFT HAND   Final   Special Requests BOTTLES DRAWN AEROBIC AND ANAEROBIC 6CC   Final    Culture NO GROWTH 4 DAYS   Final   Report Status PENDING   Incomplete  MRSA PCR SCREENING     Status: Abnormal   Collection Time    06/13/13  8:51 PM      Result Value Range Status   MRSA by PCR POSITIVE (*) NEGATIVE Final   Comment:            The GeneXpert MRSA Assay (FDA     approved for NASAL specimens     only), is one component of a     comprehensive MRSA colonization     surveillance program. It is not     intended to diagnose MRSA     infection nor to guide or     monitor treatment for     MRSA infections.     RESULT CALLED TO, READ BACK BY AND VERIFIED WITH:     KINNEY,N ON 06/13/13 AT 2300 BY LOY,C  URINE CULTURE     Status: None   Collection Time    06/14/13  4:25 PM      Result Value Range Status   Specimen Description URINE, CLEAN CATCH   Final   Special Requests NONE   Final   Culture  Setup Time 06/15/2013 01:11   Final   Colony Count NO GROWTH   Final   Culture NO GROWTH   Final   Report Status 06/16/2013 FINAL   Final  CULTURE, EXPECTORATED SPUTUM-ASSESSMENT     Status: None   Collection Time    06/15/13  5:22 AM      Result Value Range Status   Specimen Description SPUTUM   Final   Special Requests NONE   Final   Sputum evaluation     Final   Value: THIS SPECIMEN IS ACCEPTABLE. RESPIRATORY CULTURE REPORT TO FOLLOW.   Report Status 06/15/2013 FINAL   Final  CULTURE, RESPIRATORY (NON-EXPECTORATED)     Status: None   Collection Time    06/15/13  5:22 AM      Result Value Range Status   Specimen Description SPUTUM   Final   Special Requests NONE   Final   Gram Stain     Final   Value: MODERATE WBC PRESENT, PREDOMINANTLY PMN     FEW SQUAMOUS EPITHELIAL CELLS PRESENT     FEW GRAM POSITIVE COCCI IN PAIRS     IN CLUSTERS   Culture NORMAL OROPHARYNGEAL FLORA   Final   Report Status PENDING   Incomplete     Labs: Basic Metabolic Panel:  Recent Labs Lab 06/14/13 0435 06/15/13 0510 06/16/13 0538 06/17/13 0428 06/18/13 0449  NA 139 139 137 140 142   K 4.2 3.6 3.2* 4.0 4.1  CL 100 103 100 103 104  CO2 28 29 27 31 31   GLUCOSE 85 105* 206* 99 101*  BUN 17 13 11 9 12   CREATININE 1.50* 1.11 1.04 1.03 0.93  CALCIUM 8.6 8.7 8.3* 8.8 8.9   Liver Function Tests:  Recent Labs Lab 06/13/13 1428 06/14/13 0435 06/15/13 0510 06/18/13 0449  AST 17 16 15 14   ALT 10 8 8 9   ALKPHOS 58 59 65 53  BILITOT 2.2* 2.0* 0.7 0.5  PROT 7.9 7.0 6.8 6.8  ALBUMIN 3.6 3.1* 2.8* 2.7*    Recent Labs Lab 06/13/13 1428  LIPASE 21    CBC:  Recent  Labs Lab 06/13/13 1428 06/14/13 0435 06/15/13 0510 06/16/13 0538 06/17/13 0428 06/18/13 0449  WBC 14.5* 21.5* 13.9* 10.8* 9.5 9.4  NEUTROABS 11.4*  --   --   --   --   --   HGB 13.6 12.5* 11.5* 11.1* 11.2* 10.8*  HCT 41.9 38.9* 35.0* 33.6* 34.9* 33.9*  MCV 96.3 98.2 97.2 97.4 97.8 97.7  PLT 169 165 133* 155 170 170   Cardiac Enzymes:  Recent Labs Lab 06/13/13 1428 06/14/13 0925  TROPONINI <0.30 <0.30   BNP: BNP (last 3 results)  Recent Labs  06/13/13 1428 06/15/13 0930  PROBNP 1637.0* 1593.0*         Signed:  Ryana Montecalvo C  [facility] 06/18/2013, 7:32 AM

## 2013-06-18 NOTE — Progress Notes (Signed)
Patient given discharge instructions with no questions. Patients PICC line was removed by C.Alston,RN and patient stayed in supine position x30 mins. Patient to leave with son at bedside.

## 2013-06-18 NOTE — Progress Notes (Signed)
UR chart review completed.  

## 2013-06-26 ENCOUNTER — Ambulatory Visit (INDEPENDENT_AMBULATORY_CARE_PROVIDER_SITE_OTHER): Payer: Medicare Other | Admitting: Cardiovascular Disease

## 2013-06-26 ENCOUNTER — Encounter: Payer: Self-pay | Admitting: Cardiovascular Disease

## 2013-06-26 VITALS — BP 126/74 | HR 81 | Ht 73.0 in | Wt 225.0 lb

## 2013-06-26 DIAGNOSIS — J449 Chronic obstructive pulmonary disease, unspecified: Secondary | ICD-10-CM

## 2013-06-26 DIAGNOSIS — R760 Raised antibody titer: Secondary | ICD-10-CM

## 2013-06-26 DIAGNOSIS — I10 Essential (primary) hypertension: Secondary | ICD-10-CM

## 2013-06-26 DIAGNOSIS — R894 Abnormal immunological findings in specimens from other organs, systems and tissues: Secondary | ICD-10-CM

## 2013-06-26 DIAGNOSIS — I4891 Unspecified atrial fibrillation: Secondary | ICD-10-CM

## 2013-06-26 MED ORDER — NEBIVOLOL HCL 5 MG PO TABS: 5.0000 mg | ORAL_TABLET | Freq: Every day | ORAL | Status: DC

## 2013-06-26 NOTE — Assessment & Plan Note (Signed)
Add bystolic 5 mg for rate control  He already has f/u with Dr Diona Browner in [facility] /22  INR to be checked by primary on Tuesday

## 2013-06-26 NOTE — Patient Instructions (Addendum)
Your physician recommends that you schedule a follow-up appointment [id number]  UPCOMING APPOINTMENT WITH DR SM PER DR PN  Your physician has recommended you make the following change in your medication:  1) START BYSTOLIC 5MG  ONCE DAILY

## 2013-06-26 NOTE — Assessment & Plan Note (Signed)
Well controlled.  Continue current medications and low sodium Dash type diet.    

## 2013-06-26 NOTE — Assessment & Plan Note (Signed)
With history of PE on coumadin for afib as well.  Suspect he should be bridged with lovenox if coumadin ever stopped

## 2013-06-26 NOTE — Assessment & Plan Note (Signed)
Stable no active wheezing I think he will tolerate low dose beta blocker for rate control

## 2013-06-26 NOTE — Progress Notes (Signed)
Patient ID: [name], male   DOB: Nov 06, 1931, 79 y.o.   MRN: [id number] [name] is a 79 y/o male patient of Dr. Diona Browner we are following for ongoing assessment and treatment of diastolic CHF, hypertension, and atrial fib. Coumadin in followed by PCP in [place]. He was recently admitted to [facility] with community acquired pneumonia treated with abx and Inhalers for continued use with COPD. Atrial fibrillation was prominent during hospitalization and diltiazem was increased from 300mg  daily.  He has been on coumadin for 2 years .Has never had cardioversion attempted.  Better post discharge but afib rate still too high IN 90's just sitting  In clinic  No active wheezing   ROS: Denies fever, malais, weight loss, blurry vision, decreased visual acuity, cough, sputum, SOB, hemoptysis, pleuritic pain, palpitaitons, heartburn, abdominal pain, melena, lower extremity edema, claudication, or rash.  All other systems reviewed and negative  General: Affect appropriate Chronically ill white male  HEENT: normal Neck supple with no adenopathy JVP normal no bruits no thyromegaly Lungs clear with no wheezing and good diaphragmatic motion Heart:  S1/S2 no murmur, no rub, gallop or click PMI normal Abdomen: benighn, BS positve, no tenderness, no AAA no bruit.  No HSM or HJR Distal pulses intact with no bruits No edema Neuro non-focal Skin warm and dry No muscular weakness   Current Outpatient Prescriptions  Medication Sig Dispense Refill  . albuterol (PROAIR HFA) 108 (90 BASE) MCG/ACT inhaler Inhale 2 puffs into the lungs every 6 (six) hours as needed. Shortness of Breath      . albuterol (PROVENTIL) (2.5 MG/3ML) 0.083% nebulizer solution Take 2.5 mg by nebulization every 6 (six) hours as needed. Shortness of Breath      . azithromycin (ZITHROMAX) 250 MG tablet Take 1 tablet (250 mg total) by mouth daily.  6 each  0  . cefUROXime (CEFTIN) 500 MG tablet Take 1 tablet (500 mg total) by mouth 2 (two) times  daily.  10 tablet  0  . cetirizine (ZYRTEC) 10 MG tablet Take 10 mg by mouth daily.        . cloNIDine (CATAPRES) 0.1 MG tablet       . clotrimazole-betamethasone (LOTRISONE) cream       . digoxin (LANOXIN) 0.25 MG tablet Take 1 tablet (0.25 mg total) by mouth daily.  30 tablet  0  . diltiazem (CARTIA XT) 300 MG 24 hr capsule Take 1 capsule (300 mg total) by mouth at bedtime.  30 capsule  0  . ferrous gluconate (FERGON) 325 MG tablet Take 325 mg by mouth daily with breakfast.      . fluticasone (FLONASE) 50 MCG/ACT nasal spray       . folic acid (FOLVITE) 1 MG tablet Take 1 mg by mouth daily.       . furosemide (LASIX) 40 MG tablet       . gabapentin (NEURONTIN) 300 MG capsule Take 300 mg by mouth 2 (two) times daily.       Marland Kitchen ketoconazole (NIZORAL) 2 % shampoo       . methotrexate (RHEUMATREX) 2.5 MG tablet Take 2.5 mg by mouth once a week. *TAKE 5 TABLETS BY MOUTH FOR A TOTAL 12.5MG  WEEKLY ON FRIDAYS*      . Omega-3 Fatty Acids (FISH OIL) 1000 MG CAPS Take 1 capsule by mouth 2 (two) times daily.       Marland Kitchen omeprazole (PRILOSEC) 20 MG capsule Take 20 mg by mouth 2 (two) times daily.       Marland Kitchen  oxyCODONE-acetaminophen (PERCOCET) 10-325 MG per tablet Take 1 tablet by mouth every 6 (six) hours as needed for pain.      . potassium chloride SA (K-DUR,KLOR-CON) 20 MEQ tablet       . predniSONE (DELTASONE) 20 MG tablet Take 2 tablets daily for 3 days, then 1 tablet daily for 3 days, then half tablet daily for 3 days, then STOP.  12 tablet  0  . VOLTAREN 1 % GEL       . warfarin (COUMADIN) 2 MG tablet Take 4.5 mg by mouth daily. Patient Takes two and one-half tablets daily (dose = 4.5mg ).       No current facility-administered medications for this visit.    Allergies  Cymbalta and Procaine hcl  Electrocardiogram:  6/19  Rapid afib rate 114   Assessment and Plan

## 2013-06-27 ENCOUNTER — Telehealth: Payer: Self-pay | Admitting: *Deleted

## 2013-06-27 NOTE — Telephone Encounter (Signed)
PT JUST HAD APPT WITH DR Eden Emms AND STARTED TAKING BYSTOLIC YESTERDAY. BP TODAY IS 80/50 AND HR 54

## 2013-06-27 NOTE — Telephone Encounter (Signed)
Spoke to pt to advise results/instructions. Pt understood.  

## 2013-06-27 NOTE — Telephone Encounter (Signed)
Please advise in absence of Dr PN

## 2013-06-27 NOTE — Telephone Encounter (Signed)
This is [facility] patient Stop clonidine and continue bystolic for afib rate control

## 2013-06-27 NOTE — Telephone Encounter (Signed)
Called pt to have him re-check his BP while on the phone with this nurse pt noted BP as 111/52 hr 65, pt feeling good, denies chest pain/sob/swelling, pt notes a slight headache for several days, pt just took tylenol a few minutes ago, slight dizzyness when he stood up to fast but only once, please advise

## 2013-07-02 ENCOUNTER — Ambulatory Visit: Payer: Medicare Other | Admitting: Cardiology

## 2013-07-05 ENCOUNTER — Emergency Department (HOSPITAL_COMMUNITY)
Admission: EM | Admit: 2013-07-05 | Discharge: 2013-07-05 | Disposition: A | Discharge status: Home / Self Care | Payer: Medicare Other | Attending: Emergency Medicine | Admitting: Emergency Medicine

## 2013-07-05 ENCOUNTER — Emergency Department (HOSPITAL_COMMUNITY): Payer: Medicare Other

## 2013-07-05 ENCOUNTER — Encounter (HOSPITAL_COMMUNITY): Payer: Self-pay

## 2013-07-05 ENCOUNTER — Telehealth: Payer: Self-pay | Admitting: Cardiovascular Disease

## 2013-07-05 DIAGNOSIS — M069 Rheumatoid arthritis, unspecified: Secondary | ICD-10-CM | POA: Insufficient documentation

## 2013-07-05 DIAGNOSIS — R5383 Other fatigue: Secondary | ICD-10-CM | POA: Insufficient documentation

## 2013-07-05 DIAGNOSIS — Z8719 Personal history of other diseases of the digestive system: Secondary | ICD-10-CM | POA: Insufficient documentation

## 2013-07-05 DIAGNOSIS — Z9981 Dependence on supplemental oxygen: Secondary | ICD-10-CM | POA: Insufficient documentation

## 2013-07-05 DIAGNOSIS — Z9089 Acquired absence of other organs: Secondary | ICD-10-CM | POA: Insufficient documentation

## 2013-07-05 DIAGNOSIS — R531 Weakness: Secondary | ICD-10-CM

## 2013-07-05 DIAGNOSIS — R0602 Shortness of breath: Secondary | ICD-10-CM | POA: Insufficient documentation

## 2013-07-05 DIAGNOSIS — J449 Chronic obstructive pulmonary disease, unspecified: Secondary | ICD-10-CM | POA: Insufficient documentation

## 2013-07-05 DIAGNOSIS — Z9889 Other specified postprocedural states: Secondary | ICD-10-CM | POA: Insufficient documentation

## 2013-07-05 DIAGNOSIS — E860 Dehydration: Secondary | ICD-10-CM

## 2013-07-05 DIAGNOSIS — R109 Unspecified abdominal pain: Secondary | ICD-10-CM | POA: Insufficient documentation

## 2013-07-05 DIAGNOSIS — Z87891 Personal history of nicotine dependence: Secondary | ICD-10-CM | POA: Insufficient documentation

## 2013-07-05 DIAGNOSIS — R112 Nausea with vomiting, unspecified: Secondary | ICD-10-CM | POA: Insufficient documentation

## 2013-07-05 DIAGNOSIS — Z8619 Personal history of other infectious and parasitic diseases: Secondary | ICD-10-CM | POA: Insufficient documentation

## 2013-07-05 DIAGNOSIS — Z7901 Long term (current) use of anticoagulants: Secondary | ICD-10-CM | POA: Insufficient documentation

## 2013-07-05 DIAGNOSIS — J4489 Other specified chronic obstructive pulmonary disease: Secondary | ICD-10-CM | POA: Insufficient documentation

## 2013-07-05 DIAGNOSIS — Z79899 Other long term (current) drug therapy: Secondary | ICD-10-CM | POA: Insufficient documentation

## 2013-07-05 DIAGNOSIS — D649 Anemia, unspecified: Secondary | ICD-10-CM | POA: Insufficient documentation

## 2013-07-05 DIAGNOSIS — R05 Cough: Secondary | ICD-10-CM | POA: Insufficient documentation

## 2013-07-05 DIAGNOSIS — Z86711 Personal history of pulmonary embolism: Secondary | ICD-10-CM | POA: Insufficient documentation

## 2013-07-05 DIAGNOSIS — R630 Anorexia: Secondary | ICD-10-CM | POA: Insufficient documentation

## 2013-07-05 DIAGNOSIS — R42 Dizziness and giddiness: Secondary | ICD-10-CM | POA: Insufficient documentation

## 2013-07-05 DIAGNOSIS — T50905A Adverse effect of unspecified drugs, medicaments and biological substances, initial encounter: Secondary | ICD-10-CM

## 2013-07-05 DIAGNOSIS — I10 Essential (primary) hypertension: Secondary | ICD-10-CM | POA: Insufficient documentation

## 2013-07-05 DIAGNOSIS — R5381 Other malaise: Secondary | ICD-10-CM | POA: Insufficient documentation

## 2013-07-05 DIAGNOSIS — IMO0002 Reserved for concepts with insufficient information to code with codable children: Secondary | ICD-10-CM | POA: Insufficient documentation

## 2013-07-05 DIAGNOSIS — R509 Fever, unspecified: Secondary | ICD-10-CM | POA: Insufficient documentation

## 2013-07-05 DIAGNOSIS — Z8709 Personal history of other diseases of the respiratory system: Secondary | ICD-10-CM | POA: Insufficient documentation

## 2013-07-05 DIAGNOSIS — R059 Cough, unspecified: Secondary | ICD-10-CM | POA: Insufficient documentation

## 2013-07-05 DIAGNOSIS — G8929 Other chronic pain: Secondary | ICD-10-CM | POA: Insufficient documentation

## 2013-07-05 DIAGNOSIS — Z8701 Personal history of pneumonia (recurrent): Secondary | ICD-10-CM | POA: Insufficient documentation

## 2013-07-05 LAB — URINALYSIS, ROUTINE W REFLEX MICROSCOPIC
Bilirubin Urine: NEGATIVE
Ketones, ur: NEGATIVE mg/dL
Nitrite: NEGATIVE
pH: 5.5 (ref 5.0–8.0)

## 2013-07-05 LAB — CBC WITH DIFFERENTIAL/PLATELET
Lymphocytes Relative: 14 % (ref 12–46)
Lymphs Abs: 1.2 10*3/uL (ref 0.7–4.0)
Neutro Abs: 5.6 10*3/uL (ref 1.7–7.7)
Neutrophils Relative %: 67 % (ref 43–77)
Platelets: 210 10*3/uL (ref 150–400)
RBC: 4.34 MIL/uL (ref 4.22–5.81)
WBC: 8.4 10*3/uL (ref 4.0–10.5)

## 2013-07-05 LAB — COMPREHENSIVE METABOLIC PANEL
ALT: 31 U/L (ref 0–53)
Alkaline Phosphatase: 73 U/L (ref 39–117)
CO2: 30 mEq/L (ref 19–32)
GFR calc Af Amer: 80 mL/min — ABNORMAL LOW (ref >90)
Glucose, Bld: 105 mg/dL — ABNORMAL HIGH (ref 70–99)
Potassium: 4.1 mEq/L (ref 3.5–5.1)
Sodium: 142 mEq/L (ref 135–145)
Total Protein: 8 g/dL (ref 6.0–8.3)

## 2013-07-05 LAB — URINE MICROSCOPIC-ADD ON

## 2013-07-05 MED ORDER — ONDANSETRON HCL 4 MG/2ML IJ SOLN
4.0000 mg | Freq: Once | INTRAMUSCULAR | Status: AC
  Administered 2013-07-05: 4 mg via INTRAVENOUS
  Filled 2013-07-05: qty 2

## 2013-07-05 MED ORDER — SODIUM CHLORIDE 0.9 % IV BOLUS (SEPSIS)
1000.0000 mL | Freq: Once | INTRAVENOUS | Status: AC
  Administered 2013-07-05: 1000 mL via INTRAVENOUS

## 2013-07-05 MED ORDER — ONDANSETRON 4 MG PO TBDP: 4.0000 mg | ORAL_TABLET | Freq: Three times a day (TID) | ORAL | Status: DC | PRN

## 2013-07-05 MED ORDER — IOHEXOL 300 MG/ML  SOLN
50.0000 mL | Freq: Once | INTRAMUSCULAR | Status: AC | PRN
  Administered 2013-07-05: 50 mL via ORAL

## 2013-07-05 MED ORDER — IOHEXOL 300 MG/ML  SOLN
100.0000 mL | Freq: Once | INTRAMUSCULAR | Status: AC | PRN
  Administered 2013-07-05: 100 mL via INTRAVENOUS

## 2013-07-05 NOTE — ED Notes (Signed)
Complain of not being able to eat or drink. Complain of nausea, unable to sleep, tremors and some chest pain, left side

## 2013-07-05 NOTE — ED Notes (Signed)
MD at bedside. 

## 2013-07-05 NOTE — ED Provider Notes (Signed)
History    This chart was scribed for Ward Givens, MD, MD by Ashley Jacobs, ED Scribe and Bennett Scrape, ED Scribe.. The patient was seen in room APA01/APA01 and the patient's care was started at 1346.  CSN: [id number]  Arrival date & time 07/05/13  1226   First MD Initiated Contact with Patient 07/05/13 1305     Chief Complaint  Patient presents with  . Tremors    Patient is a 79 y.o. male presenting with abdominal pain. The history is provided by the patient. No language interpreter was used.  Abdominal Pain This is a new problem. The current episode started 2 days ago. Episode frequency: intermittent. The problem has not changed since onset.Associated symptoms include abdominal pain and shortness of breath (chronic). Nothing aggravates the symptoms. Relieved by: movement. He has tried nothing for the symptoms.    HPI Comments: [name] is a 79 y.o. male who presents to the Emergency Department complaining of intermittent lower abdominal pain episodes that last minutes at a time for the past few days. The pain is relieved with movement. Pt reports associated symptoms of nausea, decreased appetite and dry heaves. Pt reports dizziness ("seeing reflection of ceiling fan") and feeling of possibility of syncope with standing. He c/o feeling tired.  Pt's daughter reports that symptoms have been present since his hospitalization in June 2014 for A. Fib and PNA but have worsened upon starting Bysyolic on 06/26/13. Because of his nausea and vomiting he was instructed by his PCP to cut his dose back from 5 mg to 2.5 mg a day, however it hasn't helped. Daughter also reports concern over hypotension, last reading was 95/54 3 days ago. He had another low reading of 80/50 7 days ago.   Pt reports chronic nonproductive cough without fever, but has some  SOB, he uses 2L O2, nebulizers and inhalers as needed. Pt had a colonoscopy in January 2014 and denies any diagnoses of diverticulitis. Pt is  currently taking Coumadin. Pt denies diarrhea, hematochezia, abdominal bloating, difficulty urinating and dysuria. His last BM ws 2-3 days ago and was normal. Pt denies smoking (quit in 1999), and denies drinking.  PCP Dr Lysbeth Galas Cardiologist Dr Eden Emms  Past Medical History  Diagnosis Date  . COPD (chronic obstructive pulmonary disease)     Uses occasional nighttime O2  . Disseminated herpes zoster 2010  . GERD (gastroesophageal reflux disease)   . Asthma   . Pulmonary embolus     2003 and 2011  . Pulmonary nodules      Chest CT 09/11  . Mixed hyperlipidemia   . Atrial fibrillation     CHADS2 score 2  . Lupus anticoagulant positive   . Hypertension   . Gall stones   . Rheumatoid arthritis(714.0)   . History of chicken pox 1941; 2011  . Anemia   . Chronic lower back pain   . Edema     Evaluated by Dr. Diona Browner - felt more likely component of cor pulmonale due to chronic lung disease (EF  60-65%, grade 2 diastolic dysfunction by echo 01/2012)  . Cirrhosis     questionable, AFP normal Feb 01/2012, hx of ETOH use    Past Surgical History  Procedure Laterality Date  . Polypectomy  02/02/2012    RMR: Multiple colonic polyps removed, flat, tubular adenomas/ Left-sided diverticulosis  . Posterior lumbar vertebrae excision  2003; 2009; 2011  . Myringotomy      "3 times; both ears"  . Cataract extraction w/  intraocular lens  implant, bilateral    . Cholecystectomy  05/23/2012    Procedure: LAPAROSCOPIC CHOLECYSTECTOMY WITH INTRAOPERATIVE CHOLANGIOGRAM;  Surgeon: Almond Lint, MD;  Location: [facility] OR;  Service: General;  Laterality: N/A;  . Esophagogastroduodenoscopy  02/02/12    WUJ:[id number] size hiatal hernia; otherwise normal exam  . Colonoscopy  01/24/2013    Procedure: COLONOSCOPY;  Surgeon: Corbin Ade, MD;  Location: [facility] END[facility] SUITE;  Service: Endoscopy;  Laterality: N/A;  8:30   Family History  Problem Relation Age of Onset  . Colon cancer Neg Hx   . Liver disease Neg Hx     History  Substance Use Topics  . Smoking status: Former Smoker -- 1.00 packs/day for 57 years    Types: Cigarettes    Quit date: 01/27/1998  . Smokeless tobacco: Never Used  . Alcohol Use: No     Comment: "quit drinking 1976"  Livesat home Lives alone Oxygen 2 lpm [place]  Review of Systems  Constitutional: Positive for fever and appetite change.  Respiratory: Positive for cough (chronic) and shortness of breath (chronic).   Gastrointestinal: Positive for nausea, vomiting (dry heaves) and abdominal pain. Negative for blood in stool.  Neurological: Positive for dizziness.  All other systems reviewed and are negative.    Allergies  Cymbalta and Procaine hcl  Home Medications   Current Outpatient Rx  Name  Route  Sig  Dispense  Refill  . albuterol (PROAIR HFA) 108 (90 BASE) MCG/ACT inhaler   Inhalation   Inhale 2 puffs into the lungs every 6 (six) hours as needed. Shortness of Breath         . albuterol (PROVENTIL) (2.5 MG/3ML) 0.083% nebulizer solution   Nebulization   Take 2.5 mg by nebulization every 6 (six) hours as needed. Shortness of Breath         . azithromycin (ZITHROMAX) 250 MG tablet   Oral   Take 1 tablet (250 mg total) by mouth daily.   6 each   0   . cefUROXime (CEFTIN) 500 MG tablet   Oral   Take 1 tablet (500 mg total) by mouth 2 (two) times daily.   10 tablet   0   . cetirizine (ZYRTEC) 10 MG tablet   Oral   Take 10 mg by mouth daily.           . cloNIDine (CATAPRES) 0.1 MG tablet               . clotrimazole-betamethasone (LOTRISONE) cream               . digoxin (LANOXIN) 0.25 MG tablet   Oral   Take 1 tablet (0.25 mg total) by mouth daily.   30 tablet   0   . diltiazem (CARTIA XT) 300 MG 24 hr capsule   Oral   Take 1 capsule (300 mg total) by mouth at bedtime.   30 capsule   0   . ferrous gluconate (FERGON) 325 MG tablet   Oral   Take 325 mg by mouth daily with breakfast.         . fluticasone (FLONASE) 50  MCG/ACT nasal spray               . folic acid (FOLVITE) 1 MG tablet   Oral   Take 1 mg by mouth daily.          . furosemide (LASIX) 40 MG tablet               .  gabapentin (NEURONTIN) 300 MG capsule   Oral   Take 300 mg by mouth 2 (two) times daily.          Marland Kitchen ketoconazole (NIZORAL) 2 % shampoo               . methotrexate (RHEUMATREX) 2.5 MG tablet   Oral   Take 2.5 mg by mouth once a week. *TAKE 5 TABLETS BY MOUTH FOR A TOTAL 12.5MG  WEEKLY ON FRIDAYS*         . nebivolol (BYSTOLIC) 5 MG tablet   Oral   Take 5 mg by mouth daily.         . nebivolol (BYSTOLIC) 5 MG tablet   Oral   Take 1 tablet (5 mg total) by mouth daily.   30 tablet   6   . Omega-3 Fatty Acids (FISH OIL) 1000 MG CAPS   Oral   Take 1 capsule by mouth 2 (two) times daily.          Marland Kitchen omeprazole (PRILOSEC) 20 MG capsule   Oral   Take 20 mg by mouth 2 (two) times daily.          Marland Kitchen oxyCODONE-acetaminophen (PERCOCET) 10-325 MG per tablet   Oral   Take 1 tablet by mouth every 6 (six) hours as needed for pain.         . potassium chloride SA (K-DUR,KLOR-CON) 20 MEQ tablet               . predniSONE (DELTASONE) 20 MG tablet      Take 2 tablets daily for 3 days, then 1 tablet daily for 3 days, then half tablet daily for 3 days, then STOP.   12 tablet   0   . VOLTAREN 1 % GEL               . warfarin (COUMADIN) 2 MG tablet   Oral   Take 4.5 mg by mouth daily. Patient Takes two and one-half tablets daily (dose = 4.5mg ).          BP 124/68  Pulse 80  Temp(Src) 97.3 F (36.3 C) (Oral)  Resp 18  Ht 6\' 1"  (1.854 m)  Wt 225 lb (102.059 kg)  BMI 29.69 kg/m2  SpO2 93%  Vital signs normal    Physical Exam  Nursing note and vitals reviewed. Constitutional: He is oriented to person, place, and time. He appears well-developed and well-nourished.  Non-toxic appearance. He does not appear ill. No distress.  HENT:  Head: Normocephalic and atraumatic.  Right Ear:  External ear normal.  Left Ear: External ear normal.  Nose: Nose normal. No mucosal edema or rhinorrhea.  Mouth/Throat: Oropharynx is clear and moist and mucous membranes are normal. No dental abscesses or edematous.  Eyes: Conjunctivae and EOM are normal. Pupils are equal, round, and reactive to light.  Neck: Normal range of motion and full passive range of motion without pain. Neck supple.  Cardiovascular: Normal rate, regular rhythm and normal heart sounds.  Exam reveals no gallop and no friction rub.   No murmur heard. Pulmonary/Chest: Effort normal and breath sounds normal. No respiratory distress. He has no wheezes. He has no rhonchi. He has no rales. He exhibits no tenderness and no crepitus.  Abdominal: Soft. Normal appearance and bowel sounds are normal. He exhibits no distension. There is tenderness (epigastric, LLQ and diffusly in lower abdomen, worse in the LLQ). There is no rebound and no guarding.  Musculoskeletal: Normal range of motion. He  exhibits no edema and no tenderness.  Moves all extremities well.   Neurological: He is alert and oriented to person, place, and time. He has normal strength. No cranial nerve deficit.  Skin: Skin is warm, dry and intact. No rash noted. No erythema. No pallor.  Psychiatric: He has a normal mood and affect. His speech is normal and behavior is normal. His mood appears not anxious.    ED Course  Procedures (including critical care time)  Medications  sodium chloride 0.9 % bolus 1,000 mL (0 mLs Intravenous Stopped 07/05/13 1748)  ondansetron (ZOFRAN) injection 4 mg (4 mg Intravenous Given 07/05/13 1533)  iohexol (OMNIPAQUE) 300 MG/ML solution 50 mL (50 mLs Oral Contrast Given 07/05/13 1404)  iohexol (OMNIPAQUE) 300 MG/ML solution 100 mL (100 mLs Intravenous Contrast Given 07/05/13 1501)  sodium chloride 0.9 % bolus 1,000 mL (0 mLs Intravenous Stopped 07/05/13 1930)     DIAGNOSTIC STUDIES: Oxygen Saturation is 93% on room air, adequate by my  interpretation.    COORDINATION OF CARE: 1347 Discussed course of care with pt. Pt understands and agrees.  1349 Reviewed colonscopy from January 2014.Showed colonic polps that was removed. No colonic diverticulosis.   18:00 discussed test results. Have discussed he should talk to his cardiologist about stopping the bystolic, they want me to talk to his cardiologist.   Pt feeling better after IV fluids.   18:39 Dr Melburn Popper, [facility] agrees with stopping the bystolic, and to keep his appointment on the 22nd, if he continues to have the nausea he should see his PCP.    Results for orders placed during the hospital encounter of 07/05/13  CBC WITH DIFFERENTIAL      Result Value Range   WBC 8.4  4.0 - 10.5 K/uL   RBC 4.34  4.22 - 5.81 MIL/uL   Hemoglobin 13.8  13.0 - 17.0 g/dL   HCT 09.8  11.9 - 14.7 %   MCV 96.1  78.0 - 100.0 fL   MCH 31.8  26.0 - 34.0 pg   MCHC 33.1  30.0 - 36.0 g/dL   RDW 82.9 (*) 56.2 - 13.0 %   Platelets 210  150 - 400 K/uL   Neutrophils Relative % 67  43 - 77 %   Neutro Abs 5.6  1.7 - 7.7 K/uL   Lymphocytes Relative 14  12 - 46 %   Lymphs Abs 1.2  0.7 - 4.0 K/uL   Monocytes Relative 12  3 - 12 %   Monocytes Absolute 1.0  0.1 - 1.0 K/uL   Eosinophils Relative 7 (*) 0 - 5 %   Eosinophils Absolute 0.5  0.0 - 0.7 K/uL   Basophils Relative 1  0 - 1 %   Basophils Absolute 0.1  0.0 - 0.1 K/uL  COMPREHENSIVE METABOLIC PANEL      Result Value Range   Sodium 142  135 - 145 mEq/L   Potassium 4.1  3.5 - 5.1 mEq/L   Chloride 104  96 - 112 mEq/L   CO2 30  19 - 32 mEq/L   Glucose, Bld 105 (*) 70 - 99 mg/dL   BUN 9  6 - 23 mg/dL   Creatinine, Ser 8.65  0.50 - 1.35 mg/dL   Calcium 9.4  8.4 - 78.4 mg/dL   Total Protein 8.0  6.0 - 8.3 g/dL   Albumin 3.6  3.5 - 5.2 g/dL   AST 25  0 - 37 U/L   ALT 31  0 - 53 U/L  Alkaline Phosphatase 73  39 - 117 U/L   Total Bilirubin 1.3 (*) 0.3 - 1.2 mg/dL   GFR calc non Af Amer 69 (*) >90 mL/min   GFR calc Af Amer 80 (*)  >90 mL/min  URINALYSIS, ROUTINE W REFLEX MICROSCOPIC      Result Value Range   Color, Urine YELLOW  YELLOW   APPearance CLEAR  CLEAR   Specific Gravity, Urine 1.020  1.005 - 1.030   pH 5.5  5.0 - 8.0   Glucose, UA NEGATIVE  NEGATIVE mg/dL   Hgb urine dipstick TRACE (*) NEGATIVE   Bilirubin Urine NEGATIVE  NEGATIVE   Ketones, ur NEGATIVE  NEGATIVE mg/dL   Protein, ur NEGATIVE  NEGATIVE mg/dL   Urobilinogen, UA 0.2  0.0 - 1.0 mg/dL   Nitrite NEGATIVE  NEGATIVE   Leukocytes, UA NEGATIVE  NEGATIVE  PROTIME-INR      Result Value Range   Prothrombin Time 26.7 (*) 11.6 - 15.2 seconds   INR 2.57 (*) 0.00 - 1.49  URINE MICROSCOPIC-ADD ON      Result Value Range   Squamous Epithelial / LPF FEW (*) RARE   WBC, UA 0-2  <3 WBC/hpf   RBC / HPF 0-2  <3 RBC/hpf   Bacteria, UA RARE  RARE   Laboratory interpretation all normal except therapeutic INR   Dg Chest 2 View  07/05/2013   *RADIOLOGY REPORT*  Clinical Data: Recent pneumonia with persistent cough.  CHEST - 2 VIEW  Comparison: 06/17/2013.  Findings: Dense consolidation in the posterior aspect left lower lobe.  Hyperinflated lungs with chronic interstitial coarsening throughout.  No cardiomegaly.  Stable upper mediastinal contours.  Aortic atherosclerosis.  No acute osseous findings.  IMPRESSION: 1. Left base infiltrate, either residual or recurrent pneumonia. Recommend continued imaging surveillance to complete clearance.  2.  Chronic lung disease.   Original Report Authenticated By: Tiburcio Pea   Ct Abdomen Pelvis W Contrast  07/05/2013   *RADIOLOGY REPORT*  Clinical Data: Lower abdominal pain and history of diverticulosis.  CT ABDOMEN AND PELVIS WITH CONTRAST  Technique:  Multidetector CT imaging of the abdomen and pelvis was performed following the standard protocol during bolus administration of intravenous contrast.  Contrast: 50mL OMNIPAQUE IOHEXOL 300 MG/ML  SOLN, OMNIPAQUE IOHEXOL 300 MG/ML  SOLN 06/13/2013.  Comparison:  BODY  WALL: Unremarkable.  LOWER CHEST:  Mediastinum: Diffuse coronary artery atherosclerotic calcification. Tiny subendocardial fatty foci along the interventricular septum likely from previous myocardial infarction.  Aortic annular and valvular calcification.  Mitral annular calcification.  No cardiac enlargement or effusion.  There is enteric contrast within the imaged lower esophagus, which has mild circumferential thickening.  Lungs/pleura: Subpleural reticulation diffusely.  There is dense consolidation in the posterior left lower lobe.  More patchy opacities present at the right base.  ABDOMEN/PELVIS:  Liver: Few scattered low attenuation areas, most notable in the left lobe, likely a cysts or other benign lesions.  Biliary: Cholecystectomy.  No biliary dilatation.  Pancreas: Unremarkable.  Spleen: Numerous granulomata.  Adrenals: Unremarkable.  Kidneys and ureters: Numerous low density renal cortical lesions, previously characterized by MRI as cysts.  The largest in the upper pole left kidney, 2.5 cm in diameter. No stone or hydronephrosis.  Bladder: Thick-walled bladder without surround inflammatory changes. Circumferential nature suggests chronic outlet obstruction.  Bowel: No obstruction. Normal appendix.Colonic diverticulosis without diverticulitis.  Retroperitoneum: No mass or adenopathy.  Peritoneum: No free fluid or gas.  Reproductive: Unremarkable.  Vascular: Diffuse aortic and branch vessel atherosclerosis.  OSSEOUS: Numerous lower thoracic and lumbar endplate deformities, chronic appearing.  Diffuse degenerative disc disease, with disc narrowing in the lumbar region resulting in multilevel neural foraminal compromise. L4-5 P L I F with posterior rod and screw fixation.  IMPRESSION: 1.  Left greater than right lower lobe consolidation, likely residual or recurrent pneumonia. 2. Bladder wall thickening is presumably from chronic outlet obstruction.  Given history of lower abdominal pain, urinalysis  recommended to exclude cystitis. 3. Colonic diverticulosis.  4. Gastroesophageal reflux or poor esophageal clearance.   Original Report Authenticated By: Tiburcio Pea     Date: 07/05/2013  Rate: 71  Rhythm: atrial fibrillation  QRS Axis: left  Intervals: normal  ST/T Wave abnormalities: normal  Conduction Disutrbances:none  Narrative Interpretation:   Old EKG Reviewed: changes noted from 06/13/2013 HR was 144    1. Nausea and vomiting   2. Weakness   3. Dehydration   4. Medication side effect, initial encounter     New Prescriptions   ONDANSETRON (ZOFRAN ODT) 4 MG DISINTEGRATING TABLET    Take 1 tablet (4 mg total) by mouth every 8 (eight) hours as needed for nausea.     Devoria Albe, MD, FACEP   MDM  I personally performed the services described in this documentation, which was scribed in my presence. The recorded information has been reviewed and considered.  Devoria Albe, MD, Armando Gang   Ward Givens, MD 07/05/13 [phone]

## 2013-07-05 NOTE — Telephone Encounter (Signed)
Phone call from Dr. Devoria Albe at [facility] ER for nausea.   The patient has had nausea since starting the Bystolic several days ago.  BP and HR are OK.  He has chronic AFib and is on Diltiazem and Digoxin.  Digoxin level on 6/24 was 0.9.  Will have him DC  Bystolc .  He will keep his apt. On 8/22 with Dr. Diona Browner.  If the nausea continues, he will contact Dr. Lysbeth Galas.  He is to measure his BP and HR .  We may try Metoprolol for better rate control if needed.    Vesta Mixer, Montez Hageman., MD, [facility] 07/05/2013, 6:48 PM Office - [phone] Pager [phone]

## 2013-07-11 ENCOUNTER — Ambulatory Visit: Payer: Medicare Other | Admitting: Cardiology

## 2013-07-16 ENCOUNTER — Encounter: Payer: Self-pay | Admitting: Cardiology

## 2013-07-16 ENCOUNTER — Ambulatory Visit (INDEPENDENT_AMBULATORY_CARE_PROVIDER_SITE_OTHER): Payer: Self-pay | Admitting: Cardiology

## 2013-07-16 VITALS — BP 125/70 | HR 69 | Ht 73.0 in | Wt 225.0 lb

## 2013-07-16 DIAGNOSIS — I5032 Chronic diastolic (congestive) heart failure: Secondary | ICD-10-CM

## 2013-07-16 DIAGNOSIS — I4891 Unspecified atrial fibrillation: Secondary | ICD-10-CM

## 2013-07-16 MED ORDER — DILTIAZEM HCL ER COATED BEADS 300 MG PO CP24: 300.0000 mg | ORAL_CAPSULE | Freq: Every day | ORAL | Status: DC

## 2013-07-16 NOTE — Assessment & Plan Note (Signed)
Weight is stable. Continue current diuretic regimen. Blood pressure control is good today.

## 2013-07-16 NOTE — Patient Instructions (Addendum)
Your physician recommends that you schedule a follow-up appointment in: 4 months. You will receive a reminder letter in the mail in about 1-2 months reminding you to call and schedule your appointment. If you don't receive this letter, please contact our office. Your physician has recommended you make the following change in your medication: INCREASE YOUR DILTIAZEM TO 300 MG DAILY. Your new prescription has been sent to your pharmacy. All other medications will remain the same.

## 2013-07-16 NOTE — Progress Notes (Signed)
Clinical Summary [name] is a 79 y.o.male recently seen in the office by Dr. Eden Emms on 7/2 and was clinically stable at that time. Bystolic was added by Dr. Eden Emms and Clonidine discontinued. Bystolic has been subsequently discontinued with concerns about nausea, patient states he could not tolerated. Telephone notes reviewed.  He states that he has been doing reasonably well, no sense of palpitations or breathlessness. His nausea has improved. Call was placed to his pharmacy to verify current cardiac medication doses. Heart rate and blood pressure look good today.  Recent echocardiogram from June  2014 demonstrated mild LVH with LVEF 70-75%, very mild aortic stenosis, moderate left atrial enlargement with trivial mitral regurgitation, mildly dilated right ventricle with normal function, elevated CVP, no pericardial effusion.  Weight is stable at 225.  Allergies  Allergen Reactions  . Cymbalta (Duloxetine Hcl) Other (See Comments)    Confusion   . Procaine Hcl Hives and Other (See Comments)    NOVOCAINE: Sweating, Confusion, Not in right state of mind.  . Bystolic (Nebivolol Hcl)     Current Outpatient Prescriptions  Medication Sig Dispense Refill  . albuterol (PROAIR HFA) 108 (90 BASE) MCG/ACT inhaler Inhale 2 puffs into the lungs every 6 (six) hours as needed. Shortness of Breath      . albuterol (PROVENTIL) (2.5 MG/3ML) 0.083% nebulizer solution Take 2.5 mg by nebulization every 6 (six) hours as needed. Shortness of Breath      . cetirizine (ZYRTEC) 10 MG tablet Take 10 mg by mouth daily.        . cloNIDine (CATAPRES) 0.1 MG tablet Take 0.1 mg by mouth 2 (two) times daily.       . clotrimazole-betamethasone (LOTRISONE) cream Apply 1 application topically daily as needed.       . digoxin (LANOXIN) 0.25 MG tablet Take 1 tablet (0.25 mg total) by mouth daily.  30 tablet  0  . ferrous gluconate (FERGON) 325 MG tablet Take 325 mg by mouth daily with breakfast.      . fluticasone  (FLONASE) 50 MCG/ACT nasal spray Place 2 sprays into the nose as needed.       . folic acid (FOLVITE) 1 MG tablet Take 1 mg by mouth daily.       . furosemide (LASIX) 40 MG tablet Take 40 mg by mouth 2 (two) times daily.       Marland Kitchen gabapentin (NEURONTIN) 300 MG capsule Take 300 mg by mouth 2 (two) times daily.       Marland Kitchen ketoconazole (NIZORAL) 2 % shampoo       . methotrexate (RHEUMATREX) 2.5 MG tablet Take 2.5 mg by mouth once a week. *TAKE 5 TABLETS BY MOUTH FOR A TOTAL 12.5MG  WEEKLY ON FRIDAYS*      . Omega-3 Fatty Acids (FISH OIL) 1000 MG CAPS Take 1 capsule by mouth 2 (two) times daily.       Marland Kitchen omeprazole (PRILOSEC) 20 MG capsule Take 20 mg by mouth 2 (two) times daily.       . ondansetron (ZOFRAN ODT) 4 MG disintegrating tablet Take 1 tablet (4 mg total) by mouth every 8 (eight) hours as needed for nausea.  12 tablet  0  . oxyCODONE-acetaminophen (PERCOCET) 10-325 MG per tablet Take 1 tablet by mouth every 6 (six) hours as needed for pain.      . potassium chloride SA (K-DUR,KLOR-CON) 20 MEQ tablet Take 20 mEq by mouth 2 (two) times daily.       . VOLTAREN 1 %  GEL Apply 2 g topically daily as needed.       . warfarin (COUMADIN) 2 MG tablet Take 5 mg by mouth daily. Patient Takes two and one-half tablets daily (dose = 5mg ).      . diltiazem (CARDIZEM CD) 300 MG 24 hr capsule Take 1 capsule (300 mg total) by mouth daily.  30 capsule  6   No current facility-administered medications for this visit.    Past Medical History  Diagnosis Date  . COPD (chronic obstructive pulmonary disease)     Uses occasional nighttime O2  . Disseminated herpes zoster 2010  . GERD (gastroesophageal reflux disease)   . Asthma   . Pulmonary embolus     2003 and 2011  . Pulmonary nodules     Chest CT 09/11  . Mixed hyperlipidemia   . Atrial fibrillation     CHADS2 score 2  . Lupus anticoagulant positive   . Essential hypertension, benign   . Cholelithiasis   . Rheumatoid arthritis(714.0)   . History of  chicken pox 1941; 2011  . Anemia   . Chronic lower back pain   . Chronic diastolic heart failure   . Cirrhosis     Questionable, AFP normal Feb 01/2012, hx of ETOH use     Social History [name] reports that he quit smoking about 15 years ago. His smoking use included Cigarettes. He has a 57 pack-year smoking history. He has never used smokeless tobacco. [name] reports that he does not drink alcohol.  Review of Systems Appetite is stable. No orthopnea or PND. No cough, fevers or chills.  Physical Examination Filed Vitals:   07/16/13 1010  BP: 125/70  Pulse: 69   Filed Weights   07/16/13 1010  Weight: 225 lb (102.059 kg)    Bearded male, using a cane to ambulate, comfortable at rest. HEENT: Conjunctiva and lids normal, oropharynx clear. Neck: Supple, no carotid bruits, no thyromegaly. Lungs: Clear to auscultation, diminished, nonlabored breathing at rest. Cardiac: Irregularly irregular, no S3, no pericardial rub. Abdomen: Soft, nontender, bowel sounds present. Extremities: No pitting edema, distal pulses 2+.   Problem List and Plan   Atrial fibrillation Persistent, plan is to continue strategy of heart rate control and anticoagulation. Heart rate actually looks fairly good today. Plan will be to continue present doses of Cardizem CD and digoxin.  Chronic diastolic heart failure Weight is stable. Continue current diuretic regimen. Blood pressure control is good today.    Jonelle Sidle, M.D., F.A.C.C.

## 2013-07-16 NOTE — Assessment & Plan Note (Signed)
Persistent, plan is to continue strategy of heart rate control and anticoagulation. Heart rate actually looks fairly good today. Plan will be to continue present doses of Cardizem CD and digoxin.

## 2013-07-25 ENCOUNTER — Telehealth: Payer: Self-pay | Admitting: Cardiology

## 2013-07-25 MED ORDER — DIGOXIN 250 MCG PO TABS: 0.2500 mg | ORAL_TABLET | Freq: Every day | ORAL | Status: DC

## 2013-07-25 NOTE — Telephone Encounter (Signed)
Said that he has been taking Diogixin and his RX. And he has been reading up on this medicine and thinks that he should continue to take this medication.    I asked him to contact his pharmacy and have them send [place] a request for refill.  He main question was he suppose to continue taking

## 2013-08-29 ENCOUNTER — Emergency Department (HOSPITAL_COMMUNITY): Payer: Medicare Other

## 2013-08-29 ENCOUNTER — Emergency Department (HOSPITAL_COMMUNITY)
Admission: EM | Admit: 2013-08-29 | Discharge: 2013-08-29 | Disposition: A | Discharge status: Home / Self Care | Payer: Medicare Other | Attending: Emergency Medicine | Admitting: Emergency Medicine

## 2013-08-29 ENCOUNTER — Encounter (HOSPITAL_COMMUNITY): Payer: Self-pay | Admitting: *Deleted

## 2013-08-29 DIAGNOSIS — Z862 Personal history of diseases of the blood and blood-forming organs and certain disorders involving the immune mechanism: Secondary | ICD-10-CM | POA: Insufficient documentation

## 2013-08-29 DIAGNOSIS — Y9389 Activity, other specified: Secondary | ICD-10-CM | POA: Insufficient documentation

## 2013-08-29 DIAGNOSIS — S0990XA Unspecified injury of head, initial encounter: Secondary | ICD-10-CM

## 2013-08-29 DIAGNOSIS — Z87891 Personal history of nicotine dependence: Secondary | ICD-10-CM | POA: Insufficient documentation

## 2013-08-29 DIAGNOSIS — I4891 Unspecified atrial fibrillation: Secondary | ICD-10-CM | POA: Insufficient documentation

## 2013-08-29 DIAGNOSIS — Z79899 Other long term (current) drug therapy: Secondary | ICD-10-CM | POA: Insufficient documentation

## 2013-08-29 DIAGNOSIS — G8929 Other chronic pain: Secondary | ICD-10-CM | POA: Insufficient documentation

## 2013-08-29 DIAGNOSIS — S161XXA Strain of muscle, fascia and tendon at neck level, initial encounter: Secondary | ICD-10-CM

## 2013-08-29 DIAGNOSIS — S139XXA Sprain of joints and ligaments of unspecified parts of neck, initial encounter: Secondary | ICD-10-CM | POA: Insufficient documentation

## 2013-08-29 DIAGNOSIS — Z8639 Personal history of other endocrine, nutritional and metabolic disease: Secondary | ICD-10-CM | POA: Insufficient documentation

## 2013-08-29 DIAGNOSIS — Z8619 Personal history of other infectious and parasitic diseases: Secondary | ICD-10-CM | POA: Insufficient documentation

## 2013-08-29 DIAGNOSIS — I10 Essential (primary) hypertension: Secondary | ICD-10-CM | POA: Insufficient documentation

## 2013-08-29 DIAGNOSIS — I5032 Chronic diastolic (congestive) heart failure: Secondary | ICD-10-CM | POA: Insufficient documentation

## 2013-08-29 DIAGNOSIS — Y9289 Other specified places as the place of occurrence of the external cause: Secondary | ICD-10-CM | POA: Insufficient documentation

## 2013-08-29 DIAGNOSIS — W1809XA Striking against other object with subsequent fall, initial encounter: Secondary | ICD-10-CM | POA: Insufficient documentation

## 2013-08-29 DIAGNOSIS — D649 Anemia, unspecified: Secondary | ICD-10-CM | POA: Insufficient documentation

## 2013-08-29 DIAGNOSIS — W010XXA Fall on same level from slipping, tripping and stumbling without subsequent striking against object, initial encounter: Secondary | ICD-10-CM | POA: Insufficient documentation

## 2013-08-29 DIAGNOSIS — Z86711 Personal history of pulmonary embolism: Secondary | ICD-10-CM | POA: Insufficient documentation

## 2013-08-29 DIAGNOSIS — S63509A Unspecified sprain of unspecified wrist, initial encounter: Secondary | ICD-10-CM | POA: Insufficient documentation

## 2013-08-29 DIAGNOSIS — J449 Chronic obstructive pulmonary disease, unspecified: Secondary | ICD-10-CM | POA: Insufficient documentation

## 2013-08-29 DIAGNOSIS — K219 Gastro-esophageal reflux disease without esophagitis: Secondary | ICD-10-CM | POA: Insufficient documentation

## 2013-08-29 DIAGNOSIS — J4489 Other specified chronic obstructive pulmonary disease: Secondary | ICD-10-CM | POA: Insufficient documentation

## 2013-08-29 DIAGNOSIS — Z7901 Long term (current) use of anticoagulants: Secondary | ICD-10-CM | POA: Insufficient documentation

## 2013-08-29 DIAGNOSIS — Z791 Long term (current) use of non-steroidal anti-inflammatories (NSAID): Secondary | ICD-10-CM | POA: Insufficient documentation

## 2013-08-29 DIAGNOSIS — M069 Rheumatoid arthritis, unspecified: Secondary | ICD-10-CM | POA: Insufficient documentation

## 2013-08-29 LAB — DIGOXIN LEVEL: Digoxin Level: 0.4 ng/mL — ABNORMAL LOW (ref 0.8–2.0)

## 2013-08-29 LAB — PROTIME-INR: INR: 2.68 — ABNORMAL HIGH (ref 0.00–1.49)

## 2013-08-29 MED ORDER — NAPROXEN 500 MG PO TABS: 500.0000 mg | ORAL_TABLET | Freq: Two times a day (BID) | ORAL | Status: DC

## 2013-08-29 MED ORDER — OXYCODONE-ACETAMINOPHEN 5-325 MG PO TABS
2.0000 | ORAL_TABLET | Freq: Once | ORAL | Status: AC
  Administered 2013-08-29: 2 via ORAL
  Filled 2013-08-29: qty 2

## 2013-08-29 NOTE — ED Notes (Signed)
Pt alert & oriented x4, stable gait. Patient given discharge instructions, paperwork & prescription(s). Patient  instructed to stop at the registration desk to finish any additional paperwork. Patient verbalized understanding. Pt left department w/ no further questions. 

## 2013-08-29 NOTE — ED Notes (Signed)
Fall on Tuesday night onto ground.  Contusion to rt wrist, Headache, , rt shoulder pain,  No LOC, Struck head on ground neck is "sore"  Walks with a cane.

## 2013-08-29 NOTE — ED Provider Notes (Signed)
CSN: [id number]     Arrival date & time 08/29/13  1222 History   First MD Initiated Contact with Patient 08/29/13 1311     Chief Complaint  Patient presents with  . Fall   (Consider location/radiation/quality/duration/timing/severity/associated sxs/prior Treatment) HPI Comments: 78 year old male, history of COPD, pulmonary embolism, atrial fibrillation, congestive heart failure who presents with a complaint of  Headache, neck pain and bilateral wrist pain after a fall that occurred 48 hours ago. The symptoms were acute in onset as he tripped catching his foot on a raised edge in  [place] embank[place] trying to take a "shortcut".  He fell forward catching himself with his hands but striking his forehead. There was no loss of consciousness, pain has been persistent, mild to moderate, seems to get worse with moving his head from side to side. He does have associated bruising and swelling of the bilateral wrists right greater than left.  Patient is a 79 y.o. male presenting with fall. The history is provided by the patient and a relative.  Fall    Past Medical History  Diagnosis Date  . COPD (chronic obstructive pulmonary disease)     Uses occasional nighttime O2  . Disseminated herpes zoster 2010  . GERD (gastroesophageal reflux disease)   . Asthma   . Pulmonary embolus     2003 and 2011  . Pulmonary nodules     Chest CT 09/11  . Mixed hyperlipidemia   . Atrial fibrillation     CHADS2 score 2  . Lupus anticoagulant positive   . Essential hypertension, benign   . Cholelithiasis   . Rheumatoid arthritis(714.0)   . History of chicken pox 1941; 2011  . Anemia   . Chronic lower back pain   . Chronic diastolic heart failure   . Cirrhosis     Questionable, AFP normal Feb 01/2012, hx of ETOH use    Past Surgical History  Procedure Laterality Date  . Polypectomy  02/02/2012    RMR: Multiple colonic polyps removed, flat, tubular adenomas/ Left-sided diverticulosis  . Posterior lumbar vertebrae  excision  2003; 2009; 2011  . Myringotomy      "3 times; both ears"  . Cataract extraction w/ intraocular lens  implant, bilateral    . Cholecystectomy  05/23/2012    Procedure: LAPAROSCOPIC CHOLECYSTECTOMY WITH INTRAOPERATIVE CHOLANGIOGRAM;  Surgeon: Almond Lint, MD;  Location: [facility] OR;  Service: General;  Laterality: N/A;  . Esophagogastroduodenoscopy  02/02/12    [id number] size hiatal hernia; otherwise normal exam  . Colonoscopy  01/24/2013    Procedure: COLONOSCOPY;  Surgeon: Corbin Ade, MD;  Location: [facility];  Service: Endoscopy;  Laterality: N/A;  8:30  . Back surgery     Family History  Problem Relation Age of Onset  . Colon cancer Neg Hx   . Liver disease Neg Hx    History  Substance Use Topics  . Smoking status: Former Smoker -- 1.00 packs/day for 57 years    Types: Cigarettes    Quit date: 01/27/1998  . Smokeless tobacco: Never Used  . Alcohol Use: No     Comment: "quit drinking 1976"    Review of Systems  All other systems reviewed and are negative.    Allergies  Cymbalta; Procaine hcl; and Bystolic  Home Medications   Current Outpatient Rx  Name  Route  Sig  Dispense  Refill  . cetirizine (ZYRTEC) 10 MG tablet   Oral   Take 10 mg by mouth daily.           Marland Kitchen  cloNIDine (CATAPRES) 0.1 MG tablet   Oral   Take 0.1 mg by mouth 2 (two) times daily.          . digoxin (LANOXIN) 0.25 MG tablet   Oral   Take 1 tablet (0.25 mg total) by mouth daily.   30 tablet   6   . diltiazem (CARDIZEM CD) 300 MG 24 hr capsule   Oral   Take 300 mg by mouth at bedtime.         . ferrous gluconate (FERGON) 325 MG tablet   Oral   Take 325 mg by mouth daily with breakfast.         . folic acid (FOLVITE) 1 MG tablet   Oral   Take 1 mg by mouth daily.          . furosemide (LASIX) 40 MG tablet   Oral   Take 40 mg by mouth 2 (two) times daily.          Marland Kitchen gabapentin (NEURONTIN) 300 MG capsule   Oral   Take 300 mg by mouth 2 (two) times daily.           . Omega-3 Fatty Acids (FISH OIL) 1000 MG CAPS   Oral   Take 1 capsule by mouth 2 (two) times daily.          Marland Kitchen omeprazole (PRILOSEC) 20 MG capsule   Oral   Take 20 mg by mouth 2 (two) times daily.          Marland Kitchen oxyCODONE-acetaminophen (PERCOCET) 10-325 MG per tablet   Oral   Take 1 tablet by mouth every 6 (six) hours as needed for pain.         . potassium chloride SA (K-DUR,KLOR-CON) 20 MEQ tablet   Oral   Take 20 mEq by mouth 2 (two) times daily.          Marland Kitchen spironolactone (ALDACTONE) 25 MG tablet   Oral   Take 25 mg by mouth daily.         Marland Kitchen warfarin (COUMADIN) 2 MG tablet   Oral   Take 5 mg by mouth at bedtime. Takes 5 mg (2.5 tablets) daily except on Sundays         . albuterol (PROAIR HFA) 108 (90 BASE) MCG/ACT inhaler   Inhalation   Inhale 2 puffs into the lungs every 6 (six) hours as needed. Shortness of Breath         . albuterol (PROVENTIL) (2.5 MG/3ML) 0.083% nebulizer solution   Nebulization   Take 2.5 mg by nebulization every 6 (six) hours as needed. Shortness of Breath         . clotrimazole-betamethasone (LOTRISONE) cream   Topical   Apply 1 application topically daily as needed.          . fluticasone (FLONASE) 50 MCG/ACT nasal spray   Nasal   Place 2 sprays into the nose as needed.          Marland Kitchen ketoconazole (NIZORAL) 2 % shampoo               . methotrexate (RHEUMATREX) 2.5 MG tablet   Oral   Take 20 mg by mouth once a week. *TAKE 8 TABLETS BY MOUTH FOR A TOTAL 20MG  WEEKLY Patient takes 4 tablets (10Mg ) on Fridays and 4 tablets (10mg ) on Saturdays         . naproxen (NAPROSYN) 500 MG tablet   Oral   Take 1 tablet (500 mg total) by mouth  2 (two) times daily with a meal.   30 tablet   0   . ondansetron (ZOFRAN ODT) 4 MG disintegrating tablet   Oral   Take 1 tablet (4 mg total) by mouth every 8 (eight) hours as needed for nausea.   12 tablet   0   . VOLTAREN 1 % GEL   Topical   Apply 2 g topically daily as needed.            BP 129/85  Pulse 94  Temp(Src) 97.3 F (36.3 C) (Oral)  Resp 19  Ht 6\' 1"  (1.854 m)  Wt 225 lb (102.059 kg)  BMI 29.69 kg/m2  SpO2 91% Physical Exam  Nursing note and vitals reviewed. Constitutional: He appears well-developed and well-nourished. No distress.  HENT:  Head: Normocephalic and atraumatic.  Mouth/Throat: Oropharynx is clear and moist. No oropharyngeal exudate.  No hematomas, abrasions or lacerations to the forehead or scalp. No raccoon eyes, no battle sign, no malocclusion  Eyes: Conjunctivae and EOM are normal. Pupils are equal, round, and reactive to light. Right eye exhibits no discharge. Left eye exhibits no discharge. No scleral icterus.  Neck: No JVD present. No thyromegaly present.  Cardiovascular: Normal rate, regular rhythm, normal heart sounds and intact distal pulses.  Exam reveals no gallop and no friction rub.   No murmur heard. Pulmonary/Chest: Effort normal and breath sounds normal. No respiratory distress. He has no wheezes. He has no rales.  Abdominal: Soft. Bowel sounds are normal. He exhibits no distension and no mass. There is no tenderness.  Musculoskeletal: Normal range of motion. He exhibits tenderness ( Tender to palpation over the cervical spine in the paracervical muscles, no tenderness over the thoracic or lumbar spines). He exhibits no edema.  Swelling to the bilateral wrists, right greater than left evident on the volar surfaces associated with bruising. Tenderness with range of motion of the bilateral wrists is mild, the wrist are supple.  Lymphadenopathy:    He has no cervical adenopathy.  Neurological: He is alert. Coordination normal.  Normal strength and sensation of the bilateral upper extremities though he does have bilateral finger and mild numbness to light touch. He has normal grips at the bilateral upper extremities and is able to use his cane to walk. No weakness or numbness of the lower extremities, cranial nerves III  through XII are intact. Speech is clear, memory is intact  Skin: Skin is warm and dry. No rash noted. No erythema.  Psychiatric: He has a normal mood and affect. His behavior is normal.    ED Course  Procedures (including critical care time) Labs Review Labs Reviewed  PROTIME-INR - Abnormal; Notable for the following:    Prothrombin Time 27.6 (*)    INR 2.68 (*)    All other components within normal limits  DIGOXIN LEVEL - Abnormal; Notable for the following:    Digoxin Level 0.4 (*)    All other components within normal limits   Imaging Review Dg Wrist Complete Left  08/29/2013   *RADIOLOGY REPORT*  Clinical Data: Fallwrist pain  LEFT WRIST - COMPLETE 3+ VIEW  Comparison: None.  Findings: No fracture dislocation of the radius or ulna. Radiocarpal joint is intact.  No carpal fracture.  IMPRESSION: No left wrist fracture.   Original Report Authenticated By: Genevive Bi, M.D.   Dg Wrist Complete Right  08/29/2013   *RADIOLOGY REPORT*  Clinical Data: Fall 3 days ago  RIGHT WRIST - COMPLETE 3+ VIEW  Comparison: None.  Findings:  No fracture of the distal radius or ulna.  Radiocarpal joint is intact.  No carpal fracture.   Vascular calcifications noted.  IMPRESSION: No right wrist fracture.   Original Report Authenticated By: Genevive Bi, M.D.   Ct Head Wo Contrast  08/29/2013   *RADIOLOGY REPORT*  Clinical Data:  Headache and recent trauma  CT HEAD WITHOUT CONTRAST CT CERVICAL SPINE WITHOUT CONTRAST  Technique:  Multidetector CT imaging of the head and cervical spine was performed following the standard protocol without intravenous contrast.  Multiplanar CT image reconstructions of the cervical spine were also generated.  Comparison:   None  CT HEAD  Findings: The bony calvarium is intact.  No gross soft tissue abnormality is noted.  Mild atrophic changes are seen.  No findings to suggest acute hemorrhage, acute infarction or space-occupying mass lesion are noted.  IMPRESSION: Chronic  changes without acute abnormality.  CT CERVICAL SPINE  Findings: Seven cervical segments are well visualized.  Multilevel disc space narrowing is noted from C4-T1.  Osteophytic changes are noted as well.  No acute fracture or acute facet abnormality is noted.  The surrounding soft tissue structures are unremarkable.  IMPRESSION: Multilevel degenerative change without acute abnormality.   Original Report Authenticated By: Alcide Clever, M.D.   Ct Cervical Spine Wo Contrast  08/29/2013   *RADIOLOGY REPORT*  Clinical Data:  Headache and recent trauma  CT HEAD WITHOUT CONTRAST CT CERVICAL SPINE WITHOUT CONTRAST  Technique:  Multidetector CT imaging of the head and cervical spine was performed following the standard protocol without intravenous contrast.  Multiplanar CT image reconstructions of the cervical spine were also generated.  Comparison:   None  CT HEAD  Findings: The bony calvarium is intact.  No gross soft tissue abnormality is noted.  Mild atrophic changes are seen.  No findings to suggest acute hemorrhage, acute infarction or space-occupying mass lesion are noted.  IMPRESSION: Chronic changes without acute abnormality.  CT CERVICAL SPINE  Findings: Seven cervical segments are well visualized.  Multilevel disc space narrowing is noted from C4-T1.  Osteophytic changes are noted as well.  No acute fracture or acute facet abnormality is noted.  The surrounding soft tissue structures are unremarkable.  IMPRESSION: Multilevel degenerative change without acute abnormality.   Original Report Authenticated By: Alcide Clever, M.D.    MDM   1. Minor head injury without loss of consciousness, initial encounter   2. Cervical strain, acute, initial encounter   3. Sprain of wrist joint, unspecified laterality, initial encounter    The patient has several what appear to be mild traumatic injuries including the wrists, head and neck. He is on Coumadin, he has a bleeding risk thus a CT scan of the head for ongoing  headache as well as CT scan of the cervical spine are indicated.  Pt states has Percocet at home - has no acute traumatic findings on xray or CT - VS WNL, pt with normla MS, pain meds given, add short term meds for home.  Meds given in ED:  Medications  oxyCODONE-acetaminophen (PERCOCET/ROXICET) 5-325 MG per tablet 2 tablet (2 tablets Oral Given 08/29/13 1322)    New Prescriptions   NAPROXEN (NAPROSYN) 500 MG TABLET    Take 1 tablet (500 mg total) by mouth 2 (two) times daily with a meal.      Vida Roller, MD 08/29/13 1442

## 2013-09-13 ENCOUNTER — Emergency Department (HOSPITAL_COMMUNITY)
Admission: EM | Admit: 2013-09-13 | Discharge: 2013-09-13 | Disposition: A | Discharge status: Home / Self Care | Payer: Medicare Other | Attending: Emergency Medicine | Admitting: Emergency Medicine

## 2013-09-13 ENCOUNTER — Encounter (HOSPITAL_COMMUNITY): Payer: Self-pay | Admitting: *Deleted

## 2013-09-13 ENCOUNTER — Emergency Department (HOSPITAL_COMMUNITY): Payer: Medicare Other

## 2013-09-13 DIAGNOSIS — Z8619 Personal history of other infectious and parasitic diseases: Secondary | ICD-10-CM | POA: Insufficient documentation

## 2013-09-13 DIAGNOSIS — M069 Rheumatoid arthritis, unspecified: Secondary | ICD-10-CM | POA: Insufficient documentation

## 2013-09-13 DIAGNOSIS — I4891 Unspecified atrial fibrillation: Secondary | ICD-10-CM | POA: Insufficient documentation

## 2013-09-13 DIAGNOSIS — G8929 Other chronic pain: Secondary | ICD-10-CM | POA: Insufficient documentation

## 2013-09-13 DIAGNOSIS — J4489 Other specified chronic obstructive pulmonary disease: Secondary | ICD-10-CM | POA: Insufficient documentation

## 2013-09-13 DIAGNOSIS — Z86711 Personal history of pulmonary embolism: Secondary | ICD-10-CM | POA: Insufficient documentation

## 2013-09-13 DIAGNOSIS — Z862 Personal history of diseases of the blood and blood-forming organs and certain disorders involving the immune mechanism: Secondary | ICD-10-CM | POA: Insufficient documentation

## 2013-09-13 DIAGNOSIS — Z79899 Other long term (current) drug therapy: Secondary | ICD-10-CM | POA: Insufficient documentation

## 2013-09-13 DIAGNOSIS — I10 Essential (primary) hypertension: Secondary | ICD-10-CM | POA: Insufficient documentation

## 2013-09-13 DIAGNOSIS — D649 Anemia, unspecified: Secondary | ICD-10-CM | POA: Insufficient documentation

## 2013-09-13 DIAGNOSIS — Z87891 Personal history of nicotine dependence: Secondary | ICD-10-CM | POA: Insufficient documentation

## 2013-09-13 DIAGNOSIS — Z8639 Personal history of other endocrine, nutritional and metabolic disease: Secondary | ICD-10-CM | POA: Insufficient documentation

## 2013-09-13 DIAGNOSIS — J449 Chronic obstructive pulmonary disease, unspecified: Secondary | ICD-10-CM | POA: Insufficient documentation

## 2013-09-13 DIAGNOSIS — F0781 Postconcussional syndrome: Secondary | ICD-10-CM

## 2013-09-13 DIAGNOSIS — M542 Cervicalgia: Secondary | ICD-10-CM | POA: Insufficient documentation

## 2013-09-13 DIAGNOSIS — I5032 Chronic diastolic (congestive) heart failure: Secondary | ICD-10-CM | POA: Insufficient documentation

## 2013-09-13 DIAGNOSIS — K219 Gastro-esophageal reflux disease without esophagitis: Secondary | ICD-10-CM | POA: Insufficient documentation

## 2013-09-13 NOTE — ED Notes (Addendum)
Headache, for 3 weeks, No n/v, Fell 3  Weeks ago, seen here 9/4 ,  Larey Seat on 9/1 struck his head, and ct scan done.  Seen by his md since then   No LOC  Takes coumadin

## 2013-09-13 NOTE — ED Notes (Signed)
Patient states he has been taking 10/325 Vicodin at home for pain w/out relief.

## 2013-09-13 NOTE — ED Notes (Signed)
Patient with no complaints at this time. Respirations even and unlabored. Skin warm/dry. Discharge instructions reviewed with patient at this time. Patient given opportunity to voice concerns/ask questions. Patient discharged at this time and left Emergency Department with steady gait.   

## 2013-09-13 NOTE — ED Provider Notes (Signed)
CSN: [id number]     Arrival date & time 09/13/13  1054 History  This chart was scribed for Tony Jakes, MD by Bennett Scrape, ED Scribe. This patient was seen in room APA09/[facility]09 and the patient's care was started at 12:03 PM.    Chief Complaint  Patient presents with  . Headache    Patient is a 79 y.o. male presenting with headaches. The history is provided by the patient. No language interpreter was used.  Headache Pain location:  R parietal Radiates to:  Does not radiate Duration:  3 weeks Timing:  Constant Chronicity:  New Relieved by:  Nothing Worsened by:  Nothing tried Associated symptoms: dizziness and neck pain   Associated symptoms: no abdominal pain, no back pain, no blurred vision, no congestion, no cough, no diarrhea, no fever, no nausea, no numbness, no sore throat, no visual change and no vomiting     HPI Comments: [name] is a 79 y.o. male who presents to the Emergency Department complaining of right-sided HA for the past 3 weeks. Pt states that the HA originally started after a fall on 08/26/13 (18 days ago) in which he struck his forehead after tripping and falling over a cord on the ground. He denies LOC. He was seen 3 days later on 08/29/13 with a negative CT scan of his head. He was started on hydrocodone by his PCP during a follow up with no improvement. He is currently on coumadin for a h/o PE diagnosed 3 to 4 years ago and was advised by his PCP to get a re-scan of his head due to the possibility of a brain bleed. He continues to have the HA with associated intermittent dizziness and neck pain. He denies any changes in appetite, visual disturbance, nausea, emesis, CP and abdominal pain as associated symptoms.  Past Medical History  Diagnosis Date  . COPD (chronic obstructive pulmonary disease)     Uses occasional nighttime O2  . Disseminated herpes zoster 2010  . GERD (gastroesophageal reflux disease)   . Asthma   . Pulmonary embolus     2003 and  2011  . Pulmonary nodules     Chest CT 09/11  . Mixed hyperlipidemia   . Atrial fibrillation     CHADS2 score 2  . Lupus anticoagulant positive   . Essential hypertension, benign   . Cholelithiasis   . Rheumatoid arthritis(714.0)   . History of chicken pox 1941; 2011  . Anemia   . Chronic lower back pain   . Chronic diastolic heart failure   . Cirrhosis     Questionable, AFP normal Feb 01/2012, hx of ETOH use    Past Surgical History  Procedure Laterality Date  . Polypectomy  02/02/2012    RMR: Multiple colonic polyps removed, flat, tubular adenomas/ Left-sided diverticulosis  . Posterior lumbar vertebrae excision  2003; 2009; 2011  . Myringotomy      "3 times; both ears"  . Cataract extraction w/ intraocular lens  implant, bilateral    . Cholecystectomy  05/23/2012    Procedure: LAPAROSCOPIC CHOLECYSTECTOMY WITH INTRAOPERATIVE CHOLANGIOGRAM;  Surgeon: Almond Lint, MD;  Location: [facility] OR;  Service: General;  Laterality: N/A;  . Esophagogastroduodenoscopy  02/02/12    WUJ:[id number] size hiatal hernia; otherwise normal exam  . Colonoscopy  01/24/2013    Procedure: COLONOSCOPY;  Surgeon: Corbin Ade, MD;  Location: [facility];  Service: Endoscopy;  Laterality: N/A;  8:30  . Back surgery     Family History  Problem Relation Age of Onset  . Colon cancer Neg Hx   . Liver disease Neg Hx    History  Substance Use Topics  . Smoking status: Former Smoker -- 1.00 packs/day for 57 years    Types: Cigarettes    Quit date: 01/27/1998  . Smokeless tobacco: Never Used  . Alcohol Use: No     Comment: "quit drinking 1976"    Review of Systems  Constitutional: Negative for fever and chills.  HENT: Positive for neck pain. Negative for congestion and sore throat.   Eyes: Negative for blurred vision and visual disturbance.  Respiratory: Negative for cough and shortness of breath.   Cardiovascular: Negative for chest pain and leg swelling.  Gastrointestinal: Negative for nausea, vomiting,  abdominal pain and diarrhea.  Genitourinary: Negative for dysuria.  Musculoskeletal: Negative for back pain.  Skin: Negative for rash.  Neurological: Positive for dizziness and headaches. Negative for numbness.  Hematological: Bruises/bleeds easily (on coumadin).  Psychiatric/Behavioral: Negative for confusion.    Allergies  Cymbalta; Procaine hcl; and Bystolic  Home Medications   Current Outpatient Rx  Name  Route  Sig  Dispense  Refill  . albuterol (PROAIR HFA) 108 (90 BASE) MCG/ACT inhaler   Inhalation   Inhale 2 puffs into the lungs every 6 (six) hours as needed. Shortness of Breath         . albuterol (PROVENTIL) (2.5 MG/3ML) 0.083% nebulizer solution   Nebulization   Take 2.5 mg by nebulization every 6 (six) hours as needed. Shortness of Breath         . cetirizine (ZYRTEC) 10 MG tablet   Oral   Take 10 mg by mouth daily.           . cloNIDine (CATAPRES) 0.1 MG tablet   Oral   Take 0.1 mg by mouth 2 (two) times daily.          . digoxin (LANOXIN) 0.25 MG tablet   Oral   Take 1 tablet (0.25 mg total) by mouth daily.   30 tablet   6   . diltiazem (CARDIZEM CD) 300 MG 24 hr capsule   Oral   Take 300 mg by mouth at bedtime.         . ferrous gluconate (FERGON) 325 MG tablet   Oral   Take 325 mg by mouth daily with breakfast.         . fluticasone (FLONASE) 50 MCG/ACT nasal spray   Nasal   Place 2 sprays into the nose as needed for allergies.          . folic acid (FOLVITE) 1 MG tablet   Oral   Take 1 mg by mouth daily.          . furosemide (LASIX) 40 MG tablet   Oral   Take 40 mg by mouth 2 (two) times daily.          Marland Kitchen gabapentin (NEURONTIN) 300 MG capsule   Oral   Take 300 mg by mouth 2 (two) times daily.          Marland Kitchen HYDROcodone-acetaminophen (NORCO) 10-325 MG per tablet   Oral   Take 1 tablet by mouth 2 (two) times daily.         Marland Kitchen ketoconazole (NIZORAL) 2 % shampoo   Topical   Apply 1 application topically 2 (two) times a  week.          . methocarbamol (ROBAXIN) 500 MG tablet   Oral   Take  1 tablet by mouth 2 (two) times daily.         . methotrexate (RHEUMATREX) 2.5 MG tablet   Oral   Take 20 mg by mouth once a week. *TAKE 8 TABLETS BY MOUTH FOR A TOTAL 20MG  WEEKLY Patient takes 4 tablets (10Mg ) on Fridays and 4 tablets (10mg ) on Saturdays         . naproxen (NAPROSYN) 500 MG tablet   Oral   Take 1 tablet (500 mg total) by mouth 2 (two) times daily with a meal.   30 tablet   0   . Omega-3 Fatty Acids (FISH OIL) 1000 MG CAPS   Oral   Take 1 capsule by mouth 2 (two) times daily.          Marland Kitchen omeprazole (PRILOSEC) 20 MG capsule   Oral   Take 20 mg by mouth 2 (two) times daily.          . ondansetron (ZOFRAN ODT) 4 MG disintegrating tablet   Oral   Take 1 tablet (4 mg total) by mouth every 8 (eight) hours as needed for nausea.   12 tablet   0   . oxyCODONE-acetaminophen (PERCOCET) 10-325 MG per tablet   Oral   Take 1 tablet by mouth every 6 (six) hours as needed for pain.         . potassium chloride SA (K-DUR,KLOR-CON) 20 MEQ tablet   Oral   Take 20 mEq by mouth 2 (two) times daily.          Marland Kitchen spironolactone (ALDACTONE) 25 MG tablet   Oral   Take 25 mg by mouth daily.         Marland Kitchen topiramate (TOPAMAX) 25 MG tablet   Oral   Take 1 tablet by mouth 2 (two) times daily.          . VOLTAREN 1 % GEL   Topical   Apply 2 g topically daily as needed (pain).          Marland Kitchen warfarin (COUMADIN) 2 MG tablet   Oral   Take 5 mg by mouth See admin instructions. Takes 5 mg (2.5 tablets) daily except on Sundays doesn't take at all          Triage Vitals: BP 104/45  Pulse 64  Temp(Src) 98.1 F (36.7 C) (Oral)  Resp 20  Ht 6\' 1"  (1.854 m)  Wt 215 lb (97.523 kg)  BMI 28.37 kg/m2  SpO2 92%  Physical Exam  Nursing note and vitals reviewed. Constitutional: He is oriented to person, place, and time. He appears well-developed and well-nourished. No distress.  HENT:  Head:  Normocephalic and atraumatic.  Mouth/Throat: Oropharynx is clear and moist.  Eyes: Conjunctivae and EOM are normal. Pupils are equal, round, and reactive to light.  Sclera are clear  Neck: Neck supple. No tracheal deviation present.  Cardiovascular: Normal rate and regular rhythm.   No murmur heard. Pulses:      Dorsalis pedis pulses are 2+ on the right side, and 2+ on the left side.  Pulmonary/Chest: Effort normal and breath sounds normal. No respiratory distress. He has no wheezes.  Abdominal: Soft. Bowel sounds are normal. He exhibits no distension. There is no tenderness.  Musculoskeletal: Normal range of motion. He exhibits no edema (no ankle swelling).  Lymphadenopathy:    He has no cervical adenopathy.  Neurological: He is alert and oriented to person, place, and time. No cranial nerve deficit.  Pt able to move both sets of fingers  and toes  Skin: Skin is warm and dry. No rash noted.  Psychiatric: He has a normal mood and affect. His behavior is normal.    ED Course  Procedures (including critical care time)  DIAGNOSTIC STUDIES: Oxygen Saturation is 92% on room air, adequate by my interpretation.    COORDINATION OF CARE: 12:08 PM-Advised pt that the likelihood of a bleed at this point is slim but that I will order a CT of the head to completely rule this out due to his risk factors of age and being on an anticoagulant. Discussed treatment plan which includes CT of head with pt at bedside and pt agreed to plan.   Labs Review Labs Reviewed  PROTIME-INR - Abnormal; Notable for the following:    Prothrombin Time 26.9 (*)    INR 2.59 (*)    All other components within normal limits   Results for orders placed during the hospital encounter of 09/13/13  PROTIME-INR      Result Value Range   Prothrombin Time 26.9 (*) 11.6 - 15.2 seconds   INR 2.59 (*) 0.00 - 1.49    Imaging Review Ct Head Wo Contrast  09/13/2013   *RADIOLOGY REPORT*  Clinical Data: Headache.  Right  parietal pain for 3 weeks after fall.  CT HEAD WITHOUT CONTRAST  Technique:  Contiguous axial images were obtained from the base of the skull through the vertex without contrast  Comparison:  None.  Findings:  No evidence for hemorrhage, acute infarction, hydrocephalus, or mass lesion.  There is no extra axial fluid collection.  The skull and paranasal sinuses are unremarkable. Mild age appropriate volume loss.  IMPRESSION:  No acute intracranial process.   Original Report Authenticated By: Annia Belt, M.D    MDM   1. Post concussion syndrome    Patient's head CT again today is negative patient had a head CT at prior visit on September 4 and CT of neck that was negative no evidence of any traumatic significant findings due to the head injury this is most likely a postconcussive syndrome. As an outpatient patient primary care doctor get an MRI of the brain to rule out other possible causes but no evidence of any significant trauma. Repeat head CT's from this point on to rule out traumatic cause is not warranted.  I personally performed the services described in this documentation, which was scribed in my presence. The recorded information has been reviewed and is accurate.     Tony Jakes, MD 09/13/13 [phone]

## 2013-09-25 ENCOUNTER — Other Ambulatory Visit (HOSPITAL_COMMUNITY): Payer: Self-pay | Admitting: Adult Health Nurse Practitioner

## 2013-09-27 ENCOUNTER — Other Ambulatory Visit (HOSPITAL_COMMUNITY): Payer: Medicare Other

## 2013-09-27 ENCOUNTER — Ambulatory Visit (HOSPITAL_COMMUNITY): Payer: Medicaid Other

## 2013-09-30 ENCOUNTER — Ambulatory Visit (HOSPITAL_COMMUNITY)
Admission: RE | Admit: 2013-09-30 | Discharge: 2013-09-30 | Disposition: A | Discharge status: Home / Self Care | Payer: Medicare Other | Source: Ambulatory Visit | Attending: Adult Health Nurse Practitioner | Admitting: Adult Health Nurse Practitioner

## 2013-09-30 DIAGNOSIS — M542 Cervicalgia: Secondary | ICD-10-CM | POA: Insufficient documentation

## 2013-09-30 DIAGNOSIS — R51 Headache: Secondary | ICD-10-CM | POA: Insufficient documentation

## 2013-10-18 ENCOUNTER — Encounter: Payer: Self-pay | Admitting: Neurology

## 2013-10-21 ENCOUNTER — Encounter: Payer: Self-pay | Admitting: Neurology

## 2013-10-21 ENCOUNTER — Ambulatory Visit (INDEPENDENT_AMBULATORY_CARE_PROVIDER_SITE_OTHER): Payer: Medicare Other | Admitting: Neurology

## 2013-10-21 ENCOUNTER — Encounter (INDEPENDENT_AMBULATORY_CARE_PROVIDER_SITE_OTHER): Payer: Self-pay

## 2013-10-21 VITALS — BP 109/76 | HR 70 | Ht 68.7 in | Wt 199.0 lb

## 2013-10-21 DIAGNOSIS — R519 Headache, unspecified: Secondary | ICD-10-CM | POA: Insufficient documentation

## 2013-10-21 DIAGNOSIS — M47812 Spondylosis without myelopathy or radiculopathy, cervical region: Secondary | ICD-10-CM

## 2013-10-21 DIAGNOSIS — R51 Headache: Secondary | ICD-10-CM

## 2013-10-21 MED ORDER — GABAPENTIN 300 MG PO CAPS: ORAL_CAPSULE | ORAL | Status: DC

## 2013-10-21 NOTE — Patient Instructions (Signed)
Taper off of the topamax (topiramate) to one tablet at night for one week, then stop. Go up on the gabapentin to one capsule in the morning and two capsules at night.  Neurontin (gabapentin) may result in drowsiness, ankle swelling, gait instability, or possibly dizziness. Please contact our office if significant side effects occur with this medication.

## 2013-10-21 NOTE — Progress Notes (Signed)
Reason for visit: Headache  [name] is a 79 y.o. male  History of present illness:  Mr. Irizarry is an 79 year old right-handed white male who gives a history of a fall that occurred on 08/26/2013. The patient hit his head during the fall without associated loss of consciousness. The patient has developed some right-sided shoulder and neck discomfort, with headaches coming up from the neck. The neck and shoulder issues are bilateral this point, but are not as severe as they were initially. The patient now has headaches primarily on the top of his head that are daily in nature. The patient does have some neck stiffness that has worsened since the fall. The patient has a history of rheumatoid arthritis. The patient indicates that he was placed on Topamax, but this has not helped his headache. The patient denies any problems with balance, or any new weakness since the falls. The patient denies problems controlling the bowels or the bladder. The patient denies dizziness, or confusion. The patient has undergone MRI evaluation of the brain that shows evidence of a small left lenticular nucleus remote stroke. Some cortical atrophy is seen. The patient was on Coumadin at the time of the fall for atrial fibrillation. The patient is on gabapentin for a neuropathy problem taking 300 mg twice daily. Since the fall, the patient is not sleeping well. The patient describes the headache as a squeezing constant pain.  Past Medical History  Diagnosis Date  . COPD (chronic obstructive pulmonary disease)     Uses occasional nighttime O2  . Disseminated herpes zoster 2010  . GERD (gastroesophageal reflux disease)   . Asthma   . Pulmonary embolus     2003 and 2011  . Pulmonary nodules     Chest CT 09/11  . Mixed hyperlipidemia   . Atrial fibrillation     CHADS2 score 2  . Lupus anticoagulant positive   . Essential hypertension, benign   . Cholelithiasis   . Rheumatoid arthritis(714.0)   . History of  chicken pox 1941; 2011  . Anemia   . Chronic lower back pain   . Chronic diastolic heart failure   . Cirrhosis     Questionable, AFP normal Feb 01/2012, hx of ETOH use   . Headache(784.0) 10/21/2013  . Cervical spondylosis without myelopathy 10/21/2013    Past Surgical History  Procedure Laterality Date  . Polypectomy  02/02/2012    RMR: Multiple colonic polyps removed, flat, tubular adenomas/ Left-sided diverticulosis  . Posterior lumbar vertebrae excision  2003; 2009; 2011  . Myringotomy      "3 times; both ears"  . Cataract extraction w/ intraocular lens  implant, bilateral    . Cholecystectomy  05/23/2012    Procedure: LAPAROSCOPIC CHOLECYSTECTOMY WITH INTRAOPERATIVE CHOLANGIOGRAM;  Surgeon: Almond Lint, MD;  Location: [facility] OR;  Service: General;  Laterality: N/A;  . Esophagogastroduodenoscopy  02/02/12    VFI:[id number] size hiatal hernia; otherwise normal exam  . Colonoscopy  01/24/2013    Procedure: COLONOSCOPY;  Surgeon: Corbin Ade, MD;  Location: [facility];  Service: Endoscopy;  Laterality: N/A;  8:30  . Back surgery      Family History  Problem Relation Age of Onset  . Colon cancer Neg Hx   . Liver disease Neg Hx     Social history:  reports that he quit smoking about 15 years ago. His smoking use included Cigarettes. He has a 57 pack-year smoking history. He has never used smokeless tobacco. He reports that he does  not drink alcohol or use illicit drugs.  Medications:  Current Outpatient Prescriptions on File Prior to Visit  Medication Sig Dispense Refill  . albuterol (PROAIR HFA) 108 (90 BASE) MCG/ACT inhaler Inhale 2 puffs into the lungs every 6 (six) hours as needed. Shortness of Breath      . albuterol (PROVENTIL) (2.5 MG/3ML) 0.083% nebulizer solution Take 2.5 mg by nebulization every 6 (six) hours as needed. Shortness of Breath      . cetirizine (ZYRTEC) 10 MG tablet Take 10 mg by mouth daily.        . cloNIDine (CATAPRES) 0.1 MG tablet Take 0.1 mg by mouth 2  (two) times daily.       . digoxin (LANOXIN) 0.25 MG tablet Take 1 tablet (0.25 mg total) by mouth daily.  30 tablet  6  . diltiazem (CARDIZEM CD) 300 MG 24 hr capsule Take 300 mg by mouth at bedtime.      . ferrous gluconate (FERGON) 325 MG tablet Take 325 mg by mouth daily with breakfast.      . fluticasone (FLONASE) 50 MCG/ACT nasal spray Place 2 sprays into the nose as needed for allergies.       . folic acid (FOLVITE) 1 MG tablet Take 1 mg by mouth daily.       . furosemide (LASIX) 40 MG tablet Take 40 mg by mouth 2 (two) times daily.       Marland Kitchen HYDROcodone-acetaminophen (NORCO) 10-325 MG per tablet Take 1 tablet by mouth 2 (two) times daily.      Marland Kitchen ketoconazole (NIZORAL) 2 % shampoo Apply 1 application topically 2 (two) times a week.       . methocarbamol (ROBAXIN) 500 MG tablet Take 1 tablet by mouth 2 (two) times daily.      . methotrexate (RHEUMATREX) 2.5 MG tablet Take 20 mg by mouth once a week. *TAKE 8 TABLETS BY MOUTH FOR A TOTAL 20MG  WEEKLY Patient takes 4 tablets (10Mg ) on Fridays and 4 tablets (10mg ) on Saturdays      . naproxen (NAPROSYN) 500 MG tablet Take 1 tablet (500 mg total) by mouth 2 (two) times daily with a meal.  30 tablet  0  . Omega-3 Fatty Acids (FISH OIL) 1000 MG CAPS Take 1 capsule by mouth 2 (two) times daily.       Marland Kitchen omeprazole (PRILOSEC) 20 MG capsule Take 20 mg by mouth 2 (two) times daily.       . ondansetron (ZOFRAN ODT) 4 MG disintegrating tablet Take 1 tablet (4 mg total) by mouth every 8 (eight) hours as needed for nausea.  12 tablet  0  . oxyCODONE-acetaminophen (PERCOCET) 10-325 MG per tablet Take 1 tablet by mouth every 6 (six) hours as needed for pain.      . potassium chloride SA (K-DUR,KLOR-CON) 20 MEQ tablet Take 20 mEq by mouth 2 (two) times daily.       Marland Kitchen spironolactone (ALDACTONE) 25 MG tablet Take 25 mg by mouth daily.      . VOLTAREN 1 % GEL Apply 2 g topically daily as needed (pain).       Marland Kitchen warfarin (COUMADIN) 2 MG tablet Take 5 mg by mouth See  admin instructions. Takes 5 mg (2.5 tablets) daily except on Sundays doesn't take at all       No current facility-administered medications on file prior to visit.      Allergies  Allergen Reactions  . Cymbalta [Duloxetine Hcl] Other (See Comments)    Confusion   .  Procaine Hcl Hives and Other (See Comments)    NOVOCAINE: Sweating, Confusion, Not in right state of mind.  [name] Hcl] Other (See Comments)    unknown    ROS:  Out of a complete 14 system review of symptoms, the patient complains only of the following symptoms, and all other reviewed systems are negative.  Weight loss, fatigue Swelling the legs Ringing in the ears Snoring Urinary incontinence  Easy bruising, easy bleeding Feeling cold, increased thirst Joint pain, joint swelling, muscle cramps, achy muscles Memory loss, headache, numbness, weakness Insomnia, decreased energy  Blood pressure 109/76, pulse 70, height 5' 8.7" (1.745 m), weight 199 lb (90.266 kg).  Physical Exam  General: The patient is alert and cooperative at the time of the examination.  Head: Pupils are equal, round, and reactive to light. Discs are flat bilaterally.  Neck: The neck is supple, no carotid bruits are noted.  Respiratory: The respiratory examination is clear.  Cardiovascular: The cardiovascular examination reveals a regular rate and rhythm, no obvious murmurs or rubs are noted.  Neuromuscular: Range of movement of the cervical spine lacks about 30 of full lateral rotation bilaterally  Skin: Extremities are without significant edema.  Neurologic Exam  Mental status: The patient is alert and oriented x 3.  Cranial nerves: Facial symmetry is present. There is good sensation of the face to pinprick and soft touch on the left, decreased on the right. The strength of the facial muscles and the muscles to head turning and shoulder shrug are normal bilaterally. Speech is well enunciated, no aphasia or dysarthria  is noted. Extraocular movements are full. Visual fields are full.  Motor: The motor testing reveals 5 over 5 strength of all 4 extremities. Good symmetric motor tone is noted throughout.  Sensory: Sensory testing is intact to pinprick, soft touch, vibration sensation, and position sense on all 4 extremities, with exception that pinprick sensation is decreased on the left arm and leg as compared to the right. No evidence of extinction is noted.  Coordination: Cerebellar testing reveals good finger-nose-finger and heel-to-shin bilaterally.  Gait and station: Gait is slightly wide-based. Tandem gait is slightly unsteady. The patient uses a cane for ambulation.. Romberg is negative. No drift is seen.  Reflexes: Deep tendon reflexes are symmetric, but are depressed bilaterally. Toes are downgoing bilaterally.   Assessment/Plan:  One. Cervicogenic headache  2. Cervical spondylosis  3. Rheumatoid arthritis  The patient has undergone MRI evaluation of the brain that does not show any acute changes. On 08/29/2013, the patient underwent a CT scan of the brain and cervical spine without any acute changes noted. The patient likely has a cervicogenic headache. A sedimentation rate will be done today, and the patient will be sent for neuromuscular therapy with a physical therapy referral. The gabapentin will be increased taking 300 mg in the morning and 600 mg in the evening. The patient will be tapered off of the Topamax. The patient will followup in 3 months.  Marlan Palau MD 10/21/2013 7:58 PM  [facility] [address] [place], [place] [address]  Phone [phone] Fax [phone]

## 2013-10-24 NOTE — Progress Notes (Signed)
Quick Note:  Shared unremarkable results with patient, he understood ______

## 2013-12-13 ENCOUNTER — Telehealth: Payer: Self-pay | Admitting: *Deleted

## 2013-12-13 NOTE — Telephone Encounter (Signed)
Patient c/o slight chest pain, rating 2/10.  Does not have Nitroglycerin.  Feels like heart rate is elevated during these episodes.    Discussed patient with Dr. Diona Browner.  MD reluctant to change any meds at this time, recent documentation of heart rate has been stable.  Already on Diltiazem 300mg .  Advised to continue to monitor & if symptoms worsen to proceed to ED for evaluation.  Office visit scheduled for 01/02/2014.    Patient informed & verbalized understanding.

## 2014-01-02 ENCOUNTER — Ambulatory Visit (INDEPENDENT_AMBULATORY_CARE_PROVIDER_SITE_OTHER): Payer: Medicare Other | Admitting: Cardiology

## 2014-01-02 ENCOUNTER — Encounter: Payer: Self-pay | Admitting: Cardiology

## 2014-01-02 VITALS — BP 96/56 | HR 70 | Ht 68.0 in | Wt 199.0 lb

## 2014-01-02 DIAGNOSIS — I10 Essential (primary) hypertension: Secondary | ICD-10-CM

## 2014-01-02 DIAGNOSIS — I4891 Unspecified atrial fibrillation: Secondary | ICD-10-CM

## 2014-01-02 NOTE — Assessment & Plan Note (Signed)
Blood pressure is low normal today. He is asymptomatic. He asked about trying to simplify medications, I told him that he can likely come off of clonidine if his systolic is typically in the 100-110 range. He is to followup with Dr. Edrick Oh.

## 2014-01-02 NOTE — Assessment & Plan Note (Signed)
Persistent with plan to continue strategy of heart rate control and anticoagulation. No change to current regimen.

## 2014-01-02 NOTE — Progress Notes (Signed)
Clinical Summary [name] is a medically complex 80 y.o.male last seen in July 2014. Interval records reviewed including fall back in September, brain MRI without any obvious acute events, also no acute findings of the cervical spine by CT. He was having headaches, was evaluated by Dr. Jannifer Franklin for further treatment.   He reports no chest pain or palpitations at this time. ECG shows rate-controlled atrial fibrillation with left axis. We went over his medications. He asked about trying to simplify things. I told him that if his blood pressure remains in the current range he might be able to come off of clonidine.  Recent echocardiogram from June 2014 demonstrated mild LVH with LVEF 70-75%, very mild aortic stenosis, moderate left atrial enlargement with trivial mitral regurgitation, mildly dilated right ventricle with normal function, elevated CVP, no pericardial effusion.   Allergies  Allergen Reactions  . Cymbalta [Duloxetine Hcl] Other (See Comments)    Confusion   . Procaine Hcl Hives and Other (See Comments)    NOVOCAINE: Sweating, Confusion, Not in right state of mind.  [name]    unknown    Current Outpatient Prescriptions  Medication Sig Dispense Refill  . albuterol (PROAIR HFA) 108 (90 BASE) MCG/ACT inhaler Inhale 2 puffs into the lungs every 6 (six) hours as needed. Shortness of Breath      . albuterol (PROVENTIL) (2.5 MG/3ML) 0.083% nebulizer solution Take 2.5 mg by nebulization every 6 (six) hours as needed. Shortness of Breath      . cetirizine (ZYRTEC) 10 MG tablet Take 10 mg by mouth daily.        . cloNIDine (CATAPRES) 0.1 MG tablet Take 0.1 mg by mouth 2 (two) times daily.       . clotrimazole-betamethasone (LOTRISONE) cream Apply 1 application topically.      . digoxin (LANOXIN) 0.25 MG tablet Take 1 tablet (0.25 mg total) by mouth daily.  30 tablet  6  . diltiazem (CARDIZEM CD) 300 MG 24 hr capsule Take 300 mg by mouth at  bedtime.      . ferrous gluconate (FERGON) 325 MG tablet Take 325 mg by mouth daily with breakfast.      . fluticasone (FLONASE) 50 MCG/ACT nasal spray Place 2 sprays into the nose as needed for allergies.       . folic acid (FOLVITE) 1 MG tablet Take 1 mg by mouth daily.       . furosemide (LASIX) 40 MG tablet Take 40 mg by mouth 2 (two) times daily.       Marland Kitchen gabapentin (NEURONTIN) 300 MG capsule One capsule in the morning, two capsules in the evening  90 capsule  3  . HYDROcodone-acetaminophen (NORCO) 10-325 MG per tablet Take 1 tablet by mouth 2 (two) times daily.      Marland Kitchen ketoconazole (NIZORAL) 2 % shampoo Apply 1 application topically 2 (two) times a week.       . methocarbamol (ROBAXIN) 500 MG tablet Take 1 tablet by mouth 2 (two) times daily.      . methotrexate (RHEUMATREX) 2.5 MG tablet Take 20 mg by mouth once a week. *TAKE 8 TABLETS BY MOUTH FOR A TOTAL 20MG  WEEKLY Patient takes 4 tablets (10Mg ) on Fridays and 4 tablets (10mg ) on Saturdays      . naproxen (NAPROSYN) 500 MG tablet Take 1 tablet (500 mg total) by mouth 2 (two) times daily with a meal.  30 tablet  0  . Omega-3 Fatty Acids (  FISH OIL) 1000 MG CAPS Take 1 capsule by mouth 2 (two) times daily.       Marland Kitchen omeprazole (PRILOSEC) 20 MG capsule Take 20 mg by mouth 2 (two) times daily.       . ondansetron (ZOFRAN ODT) 4 MG disintegrating tablet Take 1 tablet (4 mg total) by mouth every 8 (eight) hours as needed for nausea.  12 tablet  0  . oxyCODONE-acetaminophen (PERCOCET) 10-325 MG per tablet Take 1 tablet by mouth every 6 (six) hours as needed for pain.      . potassium chloride SA (K-DUR,KLOR-CON) 20 MEQ tablet Take 20 mEq by mouth 2 (two) times daily.       Marland Kitchen spironolactone (ALDACTONE) 25 MG tablet Take 25 mg by mouth daily.      . VOLTAREN 1 % GEL Apply 2 g topically daily as needed (pain).       Marland Kitchen warfarin (COUMADIN) 2 MG tablet Take 5 mg by mouth See admin instructions. Takes 5 mg (2.5 tablets) daily except on "Sundays doesn't take  at all (MANAGED BY PMD)       No current facility-administered medications for this visit.    Past Medical History  Diagnosis Date  . COPD (chronic obstructive pulmonary disease)     Uses occasional nighttime O2  . Disseminated herpes zoster 2010  . GERD (gastroesophageal reflux disease)   . Asthma   . Pulmonary embolus     20" 03 and 2011  . Pulmonary nodules     Chest CT 09/11  . Mixed hyperlipidemia   . Atrial fibrillation     CHADS2 score 2  . Lupus anticoagulant positive   . Essential hypertension, benign   . Cholelithiasis   . Rheumatoid arthritis(714.0)   . History of chicken pox 1941; 2011  . Anemia   . Chronic lower back pain   . Chronic diastolic heart failure   . Cirrhosis     Questionable, AFP normal Feb 01/2012, hx of ETOH use   . Headache(784.0) 10/21/2013  . Cervical spondylosis without myelopathy 10/21/2013    Social History [name] reports that he quit smoking about 15 years ago. His smoking use included Cigarettes. He has a 57 pack-year smoking history. He has never used smokeless tobacco. [name] reports that he does not drink alcohol.  Review of Systems As outlined above. No bleeding problems. No recurrent falls or syncope. Uses a cane. Otherwise negative.  Physical Examination Filed Vitals:   01/02/14 1309  BP: 96/56  Pulse: 70   Filed Weights   01/02/14 1309  Weight: 199 lb (90.266 kg)    Bearded male, using a cane to ambulate, comfortable at rest.  HEENT: Conjunctiva and lids normal, oropharynx clear.  Neck: Supple, no carotid bruits, no thyromegaly.  Lungs: Clear to auscultation, diminished, nonlabored breathing at rest.  Cardiac: Irregularly irregular, no S3, no pericardial rub.  Abdomen: Soft, nontender, bowel sounds present.  Extremities: No pitting edema, distal pulses 2+.   Problem List and Plan   Atrial fibrillation Persistent with plan to continue strategy of heart rate control and anticoagulation. No change to current  regimen.   Essential hypertension, benign Blood pressure is low normal today. He is asymptomatic. He asked about trying to simplify medications, I told him that he can likely come off of clonidine if his systolic is typically in the 100-110 range. He is to followup with Dr. Edrick Oh.    Satira Sark, M.D., F.A.C.C.

## 2014-01-02 NOTE — Patient Instructions (Signed)

## 2014-01-08 ENCOUNTER — Telehealth: Payer: Self-pay | Admitting: Neurology

## 2014-01-08 DIAGNOSIS — R51 Headache: Secondary | ICD-10-CM

## 2014-01-08 DIAGNOSIS — S139XXA Sprain of joints and ligaments of unspecified parts of neck, initial encounter: Secondary | ICD-10-CM

## 2014-01-08 NOTE — Telephone Encounter (Signed)
PT called stating someone from this office had called.  No notation of call on file.  He states that at one point Dr. Jannifer Franklin spoke with him about PT but never heard anything else about it.  He also has a constant headache since he came for his last visit. Stated it dulls some but never truly leaves.  He says he goes to be with the headache and wakes up with the headache.  He is frustrated and would like someone to help him.  Please contact.  Thank you

## 2014-01-09 NOTE — Telephone Encounter (Signed)
Spoke with patient and he prefers to go to [facility] or physical therapy and hand specialist ([place]for his rehab, patient has headaches since his last visit

## 2014-01-09 NOTE — Telephone Encounter (Signed)
I called patient. The patient was set up for physical therapy, he never heard about this. I will reorder this. The patient has gone up on his gabapentin taking 600 mg twice daily. The patient still having cervicogenic headache.

## 2014-01-17 ENCOUNTER — Telehealth: Payer: Self-pay | Admitting: Neurology

## 2014-01-17 NOTE — Telephone Encounter (Signed)
During appointment reminder call [name] stated that he has not heard from anyone regarding his physical therapy.  He stated he was told by Dr. Jannifer Franklin that if he hadn't heard anything by today to call him.  He also stated that he has had his headache since Labor Day and it just hangs on all the time.  Please contact the patient.  Thank you

## 2014-01-17 NOTE — Telephone Encounter (Signed)
Called patient to inform him that his physical therapy had already been faxed over to [facility] On the 01/13/14, and if he does not hear from them to give them a call. I advise the patient that if he has any other problems, questions or concerns to call the office. Patient verbalized understanding.

## 2014-01-17 NOTE — Telephone Encounter (Signed)
Spoke with patient,informed that [name] said that they had called him and left a message for appt scheduling, gave him their number,he verbalizing understanding and said he would contact them

## 2014-01-21 ENCOUNTER — Encounter: Payer: Self-pay | Admitting: Nurse Practitioner

## 2014-01-21 ENCOUNTER — Ambulatory Visit (INDEPENDENT_AMBULATORY_CARE_PROVIDER_SITE_OTHER): Payer: Medicare Other | Admitting: Nurse Practitioner

## 2014-01-21 VITALS — BP 116/65 | HR 66 | Ht 68.0 in | Wt 198.0 lb

## 2014-01-21 DIAGNOSIS — M47812 Spondylosis without myelopathy or radiculopathy, cervical region: Secondary | ICD-10-CM

## 2014-01-21 DIAGNOSIS — R51 Headache: Secondary | ICD-10-CM

## 2014-01-21 NOTE — Patient Instructions (Signed)
Start Physical Therapy as planned.  Follow up with Dr. Jannifer Franklin as needed.

## 2014-01-21 NOTE — Progress Notes (Signed)
PATIENT: [name] DOB: 1931-11-03   REASON FOR VISIT: follow up for cervicogenic headache HISTORY FROM: patient  HISTORY OF PRESENT ILLNESS: [name] is an 80 year old right-handed white male who gives a history of a fall that occurred on 08/26/2013. The patient hit his head during the fall without associated loss of consciousness. The patient has developed some right-sided shoulder and neck discomfort, with headaches coming up from the neck. The neck and shoulder issues are bilateral this point, but are not as severe as they were initially. The patient now has headaches primarily on the top of his head that are daily in nature. The patient does have some neck stiffness that has worsened since the fall. The patient has a history of rheumatoid arthritis. The patient indicates that he was placed on Topamax, but this has not helped his headache. The patient denies any problems with balance, or any new weakness since the falls. The patient denies problems controlling the bowels or the bladder. The patient denies dizziness, or confusion. The patient has undergone MRI evaluation of the brain that shows evidence of a small left lenticular nucleus remote stroke. Some cortical atrophy is seen. The patient was on Coumadin at the time of the fall for atrial fibrillation. The patient is on gabapentin for a neuropathy problem taking 300 mg twice daily. Since the fall, the patient is not sleeping well. The patient describes the headache as a squeezing constant pain.  UPDATE 01/21/14 (LL):  He returns for follow up of headache, he has not had any relief, headache is still every day. His PT starts at [facility] on Tuesday.  Topamax was weaned off at last visit and he states another provider restarted it along with rizatriptan.  He sees no benefit from these.  Headaches are primarily on the top of his head that are daily in nature.  Sed rate was negative at last visit.  ROS:  Out of a complete 14 system  review of symptoms, the patient complains only of the following symptoms, and all other reviewed systems are negative.   fatigue  Neck stiffness, neck pain cough Easy bruising, easy bleeding  Joint pain, joint swelling, muscle cramps, achy muscles, back pain, coordination problem  Memory loss, headache, weakness    ALLERGIES: Allergies  Allergen Reactions  . Cymbalta [Duloxetine Hcl] Other (See Comments)    Confusion   . Procaine Hcl Hives and Other (See Comments)    NOVOCAINE: Sweating, Confusion, Not in right state of mind.  [name]    unknown    HOME MEDICATIONS: Outpatient Prescriptions Prior to Visit  Medication Sig Dispense Refill  . albuterol (PROAIR HFA) 108 (90 BASE) MCG/ACT inhaler Inhale 2 puffs into the lungs every 6 (six) hours as needed. Shortness of Breath      . albuterol (PROVENTIL) (2.5 MG/3ML) 0.083% nebulizer solution Take 2.5 mg by nebulization every 6 (six) hours as needed. Shortness of Breath      . cetirizine (ZYRTEC) 10 MG tablet Take 10 mg by mouth daily.        . cloNIDine (CATAPRES) 0.1 MG tablet Take 0.1 mg by mouth 2 (two) times daily.       . clotrimazole-betamethasone (LOTRISONE) cream Apply 1 application topically.      . digoxin (LANOXIN) 0.25 MG tablet Take 1 tablet (0.25 mg total) by mouth daily.  30 tablet  6  . diltiazem (CARDIZEM CD) 300 MG 24 hr capsule Take 300 mg by  mouth at bedtime.      . ferrous gluconate (FERGON) 325 MG tablet Take 325 mg by mouth daily with breakfast.      . fluticasone (FLONASE) 50 MCG/ACT nasal spray Place 2 sprays into the nose as needed for allergies.       . folic acid (FOLVITE) 1 MG tablet Take 1 mg by mouth daily.       . furosemide (LASIX) 40 MG tablet Take 40 mg by mouth 2 (two) times daily.       Marland Kitchen gabapentin (NEURONTIN) 300 MG capsule Take 600 mg by mouth 2 (two) times daily. One capsule in the morning, two capsules in the evening      . HYDROcodone-acetaminophen  (NORCO) 10-325 MG per tablet Take 1 tablet by mouth 2 (two) times daily.      Marland Kitchen ketoconazole (NIZORAL) 2 % shampoo Apply 1 application topically 2 (two) times a week.       . methocarbamol (ROBAXIN) 500 MG tablet Take 1 tablet by mouth 2 (two) times daily.      . methotrexate (RHEUMATREX) 2.5 MG tablet Take 20 mg by mouth once a week. *TAKE 8 TABLETS BY MOUTH FOR A TOTAL 20MG  WEEKLY Patient takes 4 tablets (10Mg ) on Fridays and 4 tablets (10mg ) on Saturdays      . naproxen (NAPROSYN) 500 MG tablet Take 1 tablet (500 mg total) by mouth 2 (two) times daily with a meal.  30 tablet  0  . Omega-3 Fatty Acids (FISH OIL) 1000 MG CAPS Take 2 capsules by mouth 2 (two) times daily.       Marland Kitchen omeprazole (PRILOSEC) 20 MG capsule Take 20 mg by mouth 2 (two) times daily.       . ondansetron (ZOFRAN ODT) 4 MG disintegrating tablet Take 1 tablet (4 mg total) by mouth every 8 (eight) hours as needed for nausea.  12 tablet  0  . oxyCODONE-acetaminophen (PERCOCET) 10-325 MG per tablet Take 1 tablet by mouth every 6 (six) hours as needed for pain.      . potassium chloride SA (K-DUR,KLOR-CON) 20 MEQ tablet Take 20 mEq by mouth 2 (two) times daily.       Marland Kitchen spironolactone (ALDACTONE) 25 MG tablet Take 25 mg by mouth daily.      . VOLTAREN 1 % GEL Apply 2 g topically daily as needed (pain).       Marland Kitchen warfarin (COUMADIN) 2 MG tablet Take 5 mg by mouth See admin instructions. Takes 5 mg (2.5 tablets) daily except on Sundays doesn't take at all (MANAGED BY PMD)       . topiramate (TOPAMAX) 50 MG tablet    Sig:   . rizatriptan (MAXALT) 10 MG tablet    Sig:    PHYSICAL EXAM  Filed Vitals:   01/21/14 1059  BP: 116/65  Pulse: 66  Height: 5\' 8"  (1.727 m)  Weight: 198 lb (89.812 kg)   Body mass index is 30.11 kg/(m^2).  Generalized: Well developed, in no acute distress  Head: normocephalic and atraumatic. Oropharynx benign  Neck: Supple, no carotid bruits  Cardiac: Irregular rate rhythm, no murmur  Musculoskeletal:  Range of movement of the cervical spine lacks about 30 of full lateral rotation bilaterally   Neurological examination  Mentation: Alert oriented to time, place, history taking. Follows all commands speech and language fluent Cranial nerve II-XII:  Pupils were equal round reactive to light extraocular movements were full, visual field were full on confrontational test. Facial sensation and strength  were normal. hearing was intact to finger rubbing bilaterally. Uvula tongue midline. head turning and shoulder shrug and were normal and symmetric.Tongue protrusion into cheek strength was normal. Motor: normal bulk and tone, full strength in the BUE, BLE, fine finger movements normal, no pronator drift. No focal weakness Sensory: normal and symmetric to light touch Coordination: finger-nose-finger, heel-to-shin bilaterally, no dysmetria Gait is slightly wide-based. Tandem gait is slightly unsteady. The patient uses a cane for ambulation.. Romberg is negative. No drift is seen.  Reflexes: Deep tendon reflexes are symmetric, but are depressed bilaterally. Toes are downgoing bilaterally.   DIAGNOSTIC DATA (LABS, IMAGING, TESTING) - I reviewed patient records, labs, notes, testing and imaging myself where available.  Lab Results  Component Value Date   WBC 8.4 07/05/2013   HGB 13.8 07/05/2013   HCT 41.7 07/05/2013   MCV 96.1 07/05/2013   PLT 210 07/05/2013      Component Value Date/Time   NA 142 07/05/2013 1351   NA 140 01/11/2012 1005   K 4.1 07/05/2013 1351   K 4.5 01/11/2012 1005   CL 104 07/05/2013 1351   CO2 30 07/05/2013 1351   GLUCOSE 105* 07/05/2013 1351   BUN 9 07/05/2013 1351   BUN 12 01/11/2012 1005   CREATININE 1.00 07/05/2013 1351   CREATININE 1.17 01/11/2012 1005   CALCIUM 9.4 07/05/2013 1351   CALCIUM 9.2 01/11/2012 1005   PROT 8.0 07/05/2013 1351   ALBUMIN 3.6 07/05/2013 1351   AST 25 07/05/2013 1351   ALT 31 07/05/2013 1351   ALKPHOS 73 07/05/2013 1351   BILITOT 1.3* 07/05/2013 1351    GFRNONAA 69* 07/05/2013 1351   GFRAA 80* 07/05/2013 1351   Lab Results  Component Value Date   CHOL 160 05/22/2012   HDL 49 05/22/2012   LDLCALC 91 05/22/2012   TRIG 98 05/22/2012   CHOLHDL 3.3 05/22/2012    Lab Results  Component Value Date   TSH 0.509 06/14/2013   Lab Results  Component Value Date   ESRSEDRATE 25 10/21/2013   ASSESSMENT AND PLAN 1. Cervicogenic headache  2. Cervical spondylosis  3. Rheumatoid arthritis   The patient has undergone MRI evaluation of the brain that does not show any acute changes. On 08/29/2013, the patient underwent a CT scan of the brain and cervical spine without any acute changes noted. The patient likely has a cervicogenic headache. A sedimentation rate was normal and the patient has not started physical therapy yet.   Continue gabapentin 300 mg in the morning and 600 mg in the evening.  The patient will followup in 3-6 months with Dr. Jannifer Franklin, sooner as needed.  Philmore Pali, MSN, NP-C 01/21/2014, 12:35 PM [facility] [address] [place], [place] [address] [phone]  Note: This document was prepared with digital dictation and possible smart phrase technology. Any transcriptional errors that result from this process are unintentional.

## 2014-01-22 ENCOUNTER — Ambulatory Visit (HOSPITAL_COMMUNITY): Payer: Medicare Other | Admitting: Physical Therapy

## 2014-01-28 ENCOUNTER — Ambulatory Visit (HOSPITAL_COMMUNITY)
Admission: RE | Admit: 2014-01-28 | Discharge: 2014-01-28 | Disposition: A | Discharge status: Home / Self Care | Payer: Medicare Other | Source: Ambulatory Visit | Attending: Neurology | Admitting: Neurology

## 2014-01-28 DIAGNOSIS — J4489 Other specified chronic obstructive pulmonary disease: Secondary | ICD-10-CM | POA: Insufficient documentation

## 2014-01-28 DIAGNOSIS — J449 Chronic obstructive pulmonary disease, unspecified: Secondary | ICD-10-CM | POA: Insufficient documentation

## 2014-01-28 DIAGNOSIS — R293 Abnormal posture: Secondary | ICD-10-CM | POA: Insufficient documentation

## 2014-01-28 DIAGNOSIS — M2569 Stiffness of other specified joint, not elsewhere classified: Secondary | ICD-10-CM | POA: Insufficient documentation

## 2014-01-28 DIAGNOSIS — IMO0001 Reserved for inherently not codable concepts without codable children: Secondary | ICD-10-CM | POA: Insufficient documentation

## 2014-01-28 DIAGNOSIS — I10 Essential (primary) hypertension: Secondary | ICD-10-CM | POA: Insufficient documentation

## 2014-01-28 DIAGNOSIS — M542 Cervicalgia: Secondary | ICD-10-CM | POA: Insufficient documentation

## 2014-01-28 DIAGNOSIS — M2560 Stiffness of unspecified joint, not elsewhere classified: Secondary | ICD-10-CM | POA: Insufficient documentation

## 2014-01-28 NOTE — Evaluation (Signed)
Physical Therapy Evaluation  Patient Details  Name: [name] MRN: [id number] Date of Birth: Dec 09, 1931  Today's Date: 01/28/2014 Time: 1345-1430 PT Time Calculation (min): 45 min              Visit#: 1 of 8  Re-eval: 02/27/14 Assessment Diagnosis: cervcial sprain. Next MD Visit: 02/18/2014 Prior Therapy: no  Authorization: [employer] medicare     Past Medical History:  Past Medical History  Diagnosis Date  . COPD (chronic obstructive pulmonary disease)     Uses occasional nighttime O2  . Disseminated herpes zoster 2010  . GERD (gastroesophageal reflux disease)   . Asthma   . Pulmonary embolus     2003 and 2011  . Pulmonary nodules     Chest CT 09/11  . Mixed hyperlipidemia   . Atrial fibrillation     CHADS2 score 2  . Lupus anticoagulant positive   . Essential hypertension, benign   . Cholelithiasis   . Rheumatoid arthritis(714.0)   . History of chicken pox 1941; 2011  . Anemia   . Chronic lower back pain   . Chronic diastolic heart failure   . Cirrhosis     Questionable, AFP normal Feb 01/2012, hx of ETOH use   . Headache(784.0) 10/21/2013  . Cervical spondylosis without myelopathy 10/21/2013   Past Surgical History:  Past Surgical History  Procedure Laterality Date  . Polypectomy  02/02/2012    RMR: Multiple colonic polyps removed, flat, tubular adenomas/ Left-sided diverticulosis  . Posterior lumbar vertebrae excision  2003; 2009; 2011  . Myringotomy      "3 times; both ears"  . Cataract extraction w/ intraocular lens  implant, bilateral    . Cholecystectomy  05/23/2012    Procedure: LAPAROSCOPIC CHOLECYSTECTOMY WITH INTRAOPERATIVE CHOLANGIOGRAM;  Surgeon: Stark Klein, MD;  Location: [facility];  Service: General;  Laterality: N/A;  . Esophagogastroduodenoscopy  02/02/12    CNO:[id number] size hiatal hernia; otherwise normal exam  . Colonoscopy  01/24/2013    Procedure: COLONOSCOPY;  Surgeon: Daneil Dolin, MD;  Location: [facility];  Service: Endoscopy;   Laterality: N/A;  8:30  . Back surgery      Subjective Symptoms/Limitations Symptoms: [name] is an 80 year old right-handed white male who gives a history of a fall that occurred on 08/26/2013. The patient hit his head during the fall from tripping over a drop cord.. The patient has developed some right-sided shoulder and neck discomfort, with headaches.  He states that he can get his neck to where it doesn't hurt but he always has a headache.  The pt states that the headaches seem to move initially it was worse on the left side and now it has moved to the right.  He has tried medication which does not seem to help other than this he has not tried any other tratments.   Pertinent History: hx of low back pain with three operations with the last operation being in 2002.   How long can you sit comfortably?: no change of pain How long can you stand comfortably?: no change of pain How long can you walk comfortably?: no change of pain Pain Assessment Currently in Pain?: Yes Pain Score: 8  Pain Location: Neck Pain Orientation: Left Pain Type: Chronic pain Pain Radiating Towards: bottom of neck Pain Onset: More than a month ago Pain Frequency: Constant Pain Relieving Factors: nothing Multiple Pain Sites: Yes  Prior Functioning  Prior Function Leisure: Hobbies-yes (Comment) Comments: fish   Sensation/Coordination/Flexibility/Functional Tests Functional Tests Functional Tests:  foto 51  Assessment Cervical AROM Cervical Flexion: wnl Cervical Extension: decreased 20% reps no change. Cervical - Right Side Bend: wnl reps increase pain Cervical - Left Side Bend: decreased 15% reps increase pain Cervical - Right Rotation: decreased 70% Cervical - Left Rotation: decreased 80% Cervical Strength Cervical Extension: 2+/5 Cervical - Right Side Bend: 2+/5 Cervical - Left Side Bend: 2+/5  Exercise/Treatments     Seated Exercises Cervical Isometrics: Extension;Right lateral flexion;Left  lateral flexion;3 secs;5 reps Cervical Rotation: Both;5 reps Lateral Flexion: Both;5 reps Shoulder Shrugs:  (up, back now relax x5)  Manual Therapy Manual Therapy: Other (comment) Other Manual Therapy: gentle jt mobs to promote rotation, manual traction and soft tissue technique to decreae H/A and cervical pain  Physical Therapy Assessment and Plan PT Assessment and Plan Clinical Impression Statement: Pt is an 80yo male who has had increased headaches and cervical pain since a fall several months ago.  Pt has tried various medications without relief.  He is now being referred to PT for assistance with his pain.  Exam reveals postural deficits, decreased ROM and decreased strength.  The pt will benefit from skilled therapy for ther ex and manual techniques to return pt to maximal functional potential and improve his quality of life.  Pt will benefit from skilled therapeutic intervention in order to improve on the following deficits: Pain;Decreased strength;Decreased range of motion;Increased fascial restricitons Rehab Potential: Good PT Frequency: Min 2X/week PT Duration: 4 weeks PT Treatment/Interventions: Therapeutic exercise;Therapeutic activities;Manual techniques;Patient/family education PT Plan: begin higher level cervical stabilization continue with  manual techniques to optimize therapy results    Goals Home Exercise Program Pt/caregiver will Perform Home Exercise Program: For increased strengthening PT Goal: Perform Home Exercise Program - Progress: Goal set today PT Short Term Goals Time to Complete Short Term Goals: 2 weeks PT Short Term Goal 1: Pt Rotation to be improved by 25% to improve safety of driving PT Short Term Goal 2: cervical pain, (headaches)  pain to be no greater than a 5/10 for improved quality of life PT Short Term Goal 3: cervical strength to be 4/5 to allow the above to occur PT Long Term Goals Time to Complete Long Term Goals: 4 weeks PT Long Term Goal 1: I  in advance HEP PT Long Term Goal 2: Pt pain to be no greater than a 3/10 for improved quality of life Long Term Goal 3: Pt ROM to be 80% of normal for safer driving.  Problem List Patient Active Problem List   Diagnosis Date Noted  . Stiffness of joints, not elsewhere classified, multiple sites 01/28/2014  . Posture imbalance 01/28/2014  . Headache(784.0) 10/21/2013  . Cervical spondylosis without myelopathy 10/21/2013  . Chronic diastolic heart failure [id number]  . Adenomatous polyps 01/11/2013  . Anemia 10/13/2012  . COPD (chronic obstructive pulmonary disease) 05/21/2012  . Long term current use of anticoagulant 05/21/2012  . Arthritis 05/21/2012  . Lupus anticoagulant positive 01/18/2012  . GERD (gastroesophageal reflux disease) 01/18/2012  . Cirrhosis 01/18/2012    Class: Question of  . Essential hypertension, benign 11/02/2010  . PULMONARY EMBOLISM 11/02/2010  . Atrial fibrillation 11/02/2010  . Rheumatoid arthritis 11/02/2010    PT - End of Session Equipment Utilized During Treatment: Gait belt Activity Tolerance: Patient tolerated treatment well General Behavior During Therapy: WFL for tasks assessed/performed PT Plan of Care PT Home Exercise Plan: given  GP Functional Assessment Tool Used: foto Functional Limitation: Other PT primary Other PT Primary Current Status [id number]): At least 40  percent but less than 60 percent impaired, limited or restricted Other PT Primary Goal Status (N4627): At least 20 percent but less than 40 percent impaired, limited or restricted  Macintyre Alexa,CINDY 01/28/2014, 4:01 PM  Physician Documentation Your signature is required to indicate approval of the treatment plan as stated above.  Please sign and either send electronically or make a copy of this report for your files and return this physician signed original.   Please mark one 1.__approve of plan  2. ___approve of plan with the following conditions.   ______________________________                                                           _____________________ Physician Signature                                                                                                             Date

## 2014-02-05 ENCOUNTER — Ambulatory Visit (HOSPITAL_COMMUNITY)
Admission: RE | Admit: 2014-02-05 | Discharge: 2014-02-05 | Disposition: A | Discharge status: Home / Self Care | Payer: Medicare Other | Source: Ambulatory Visit | Attending: Family Medicine | Admitting: Family Medicine

## 2014-02-05 DIAGNOSIS — R293 Abnormal posture: Secondary | ICD-10-CM

## 2014-02-05 DIAGNOSIS — M2560 Stiffness of unspecified joint, not elsewhere classified: Secondary | ICD-10-CM

## 2014-02-05 NOTE — Progress Notes (Signed)
Physical Therapy Treatment Patient Details  Name: [name] MRN: [id number] Date of Birth: 05-07-1931  Today's Date: 02/05/2014 Time: 4008-6761 PT Time Calculation (min): 45 min Charge:  There ex 1430-1456; manual [id number] Visit#: 2 of 8  Re-eval: 02/27/14   Authorization: [employer] medicare  Subjective: Symptoms/Limitations Symptoms: Mr. Corella states that every bone in his body is hurting.  States compliance wth his HEP Pain Assessment Currently in Pain?: Yes Pain Score:  (15) Pain Location: Neck Pain Orientation: Left Pain Type: Chronic pain   Exercise/Treatments   Machines for Strengthening UBE (Upper Arm Bike): 4 min backwards Standing Exercises Other Standing Exercises: standing at wall pull back into erect position Seated Exercises Cervical Isometrics: Extension;Right lateral flexion;Left lateral flexion;10 reps Cervical Rotation: Both;5 reps Lateral Flexion: Both;10 reps W Back: 10 reps Shoulder Shrugs:  (up, back now relax x5) Shoulder Rolls: Backwards;5 reps Supine Exercises Neck Retraction: 5 reps Other Supine Exercise: scapular retraction x 10    Manual Therapy Other Manual Therapy: Pt recieved gentle jt mobs to promote SB and rotation, manual traction and soft tissue massage .  Physical Therapy Assessment and Plan PT Assessment and Plan Clinical Impression Statement: Pt has difficulty coordinating exercises thus all exercises are facilitated by therapist.  Pt will need continued strengthening to correct postural deficits. PT Plan: begin UE flexion as well as prone strengthening exercises.    Goals    Problem List Patient Active Problem List   Diagnosis Date Noted  . Stiffness of joints, not elsewhere classified, multiple sites 01/28/2014  . Posture imbalance 01/28/2014  . Headache(784.0) 10/21/2013  . Cervical spondylosis without myelopathy 10/21/2013  . Chronic diastolic heart failure 95/08/3266  . Adenomatous polyps 01/11/2013  . Anemia  10/13/2012  . COPD (chronic obstructive pulmonary disease) 05/21/2012  . Long term current use of anticoagulant 05/21/2012  . Arthritis 05/21/2012  . Lupus anticoagulant positive 01/18/2012  . GERD (gastroesophageal reflux disease) 01/18/2012  . Cirrhosis 01/18/2012    Class: Question of  . Essential hypertension, benign 11/02/2010  . PULMONARY EMBOLISM 11/02/2010  . Atrial fibrillation 11/02/2010  . Rheumatoid arthritis 11/02/2010       GP    RUSSELL,CINDY 02/05/2014, 3:47 PM

## 2014-02-07 ENCOUNTER — Ambulatory Visit (HOSPITAL_COMMUNITY)
Admission: RE | Admit: 2014-02-07 | Discharge: 2014-02-07 | Disposition: A | Discharge status: Home / Self Care | Payer: Medicare Other | Source: Ambulatory Visit | Attending: Family Medicine | Admitting: Family Medicine

## 2014-02-07 DIAGNOSIS — R293 Abnormal posture: Secondary | ICD-10-CM

## 2014-02-07 DIAGNOSIS — M2560 Stiffness of unspecified joint, not elsewhere classified: Secondary | ICD-10-CM

## 2014-02-07 NOTE — Progress Notes (Signed)
Physical Therapy Treatment Patient Details  Name: [name] MRN: [id number] Date of Birth: 1931-05-23  Today's Date: 02/07/2014 Time: 8921-1941 PT Time Calculation (min): 47 min There ex [phone]; manual [phone] Visit#: 3 of 8  Re-eval: 02/27/14   Authorization: [employer] medicare   Subjective: Symptoms/Limitations Symptoms: Pt comes to department stating that his pain is not getting any better.  Pt is standing with slight flexion at hips increased kyphosis, and rounded shoulders.  Therapist explained that it will take time for his posture to improve and until this occurs his pain will probably not improve significantly.  Spoke to pt about the importance of being cognitive of his posture as he does not seem to be aware of the fact that he is holding his head in a forward position. Pain Assessment Currently in Pain?: Yes Pain Score: 7  Pain Location: Neck Pain Orientation: Left Pain Type: Chronic pain  Exercise/Treatments   Machines for Strengthening UBE (Upper Arm Bike): 4 min backwards Theraband Exercises Scapula Retraction: 10 reps;Green;Limitations Scapula Retraction Limitations:  (green) Shoulder Extension: 10 reps;Green Rows: 10 reps;Green Standing Exercises Other Standing Exercises: standing at wall pull back into erect position B UE flexion Seated Exercises Cervical Isometrics: Extension;Right lateral flexion;Left lateral flexion;10 reps W Back: 10 reps;Weight W Back Weights (lbs): 2 Money: 10 reps Shoulder Shrugs:  (up, back now relax x5) Shoulder Rolls: Backwards;5 reps Supine Exercises Neck Retraction: 5 reps Other Supine Exercise: scapular retraction x 10  Manual Therapy Other Manual Therapy: manual tractiona nd soft tissue massage with gentle mobs to promote SB and rotation.    Physical Therapy Assessment and Plan PT Assessment and Plan Clinical Impression Statement: Pt improving in coordation of exercises.  Pt has difficulty coming to an erect position  without manual and verbal cuing.  Added t-band exercises to improve posture.  PT Plan: begin prone exercises for increased strengthening.    Goals  progressing  Problem List Patient Active Problem List   Diagnosis Date Noted  . Stiffness of joints, not elsewhere classified, multiple sites 01/28/2014  . Posture imbalance 01/28/2014  . Headache(784.0) 10/21/2013  . Cervical spondylosis without myelopathy 10/21/2013  . Chronic diastolic heart failure 74/07/1447  . Adenomatous polyps 01/11/2013  . Anemia 10/13/2012  . COPD (chronic obstructive pulmonary disease) 05/21/2012  . Long term current use of anticoagulant 05/21/2012  . Arthritis 05/21/2012  . Lupus anticoagulant positive 01/18/2012  . GERD (gastroesophageal reflux disease) 01/18/2012  . Cirrhosis 01/18/2012    Class: Question of  . Essential hypertension, benign 11/02/2010  . PULMONARY EMBOLISM 11/02/2010  . Atrial fibrillation 11/02/2010  . Rheumatoid arthritis 11/02/2010       GP    Fedor Kazmierski,CINDY 02/07/2014, 4:11 PM

## 2014-02-10 ENCOUNTER — Ambulatory Visit (HOSPITAL_COMMUNITY): Payer: Medicare Other | Admitting: Physical Therapy

## 2014-02-12 ENCOUNTER — Ambulatory Visit (HOSPITAL_COMMUNITY): Payer: Medicare Other | Admitting: Physical Therapy

## 2014-02-17 ENCOUNTER — Ambulatory Visit (HOSPITAL_COMMUNITY)
Admission: RE | Admit: 2014-02-17 | Discharge: 2014-02-17 | Disposition: A | Discharge status: Home / Self Care | Payer: Medicare Other | Source: Ambulatory Visit | Attending: Neurology | Admitting: Neurology

## 2014-02-17 NOTE — Progress Notes (Signed)
Physical Therapy Treatment Patient Details  Name: CASWELL ALVILLAR MRN: [id number] Date of Birth: 1931/01/06  Today's Date: 02/17/2014 Time: 1761-6073 PT Time Calculation (min): 34 min Charges: Therex x 32'  Visit#: 4 of 8  Re-eval: 02/27/14  Authorization: [employer] medicare    Subjective: Symptoms/Limitations Symptoms: Pt reports HEP compliance. Pain Assessment Currently in Pain?: Yes Pain Score: 7  Pain Location: Neck Pain Orientation: Left  Exercise/Treatments Machines for Strengthening UBE (Upper Arm Bike): 4'@3 .0 backward Theraband Exercises Scapula Retraction: 10 reps;Green Shoulder Extension: 10 reps;Green Rows: 10 reps;Green Standing Exercises Other Standing Exercises: standing at wall pull back into erect position B UE flexion x 10 Seated Exercises Neck Retraction: 10 reps W Back: 10 reps;Weight W Back Weights (lbs): 2 Shoulder Rolls: 10 reps;Backwards  Physical Therapy Assessment and Plan PT Assessment and Plan Clinical Impression Statement: Pt requires continuous multimodal cueing to improve form with exercises throughout session. Pt has significant difficulty with cervical retraction secondary to weakness. Pt appears impatient with his progress with therapy. Therapist reiterated that it can take months to rehab the cervical spine. Manual held as pt feels it is not beneficial. PT Plan: Continue to progress cervical strength and ROM to decrease pain and improve posture.     Problem List Patient Active Problem List   Diagnosis Date Noted  . Stiffness of joints, not elsewhere classified, multiple sites 01/28/2014  . Posture imbalance 01/28/2014  . Headache(784.0) 10/21/2013  . Cervical spondylosis without myelopathy 10/21/2013  . Chronic diastolic heart failure 71/05/2693  . Adenomatous polyps 01/11/2013  . Anemia 10/13/2012  . COPD (chronic obstructive pulmonary disease) 05/21/2012  . Long term current use of anticoagulant 05/21/2012  . Arthritis 05/21/2012   . Lupus anticoagulant positive 01/18/2012  . GERD (gastroesophageal reflux disease) 01/18/2012  . Cirrhosis 01/18/2012    Class: Question of  . Essential hypertension, benign 11/02/2010  . PULMONARY EMBOLISM 11/02/2010  . Atrial fibrillation 11/02/2010  . Rheumatoid arthritis 11/02/2010    PT - End of Session Activity Tolerance: Patient tolerated treatment well General Behavior During Therapy: [facility] for tasks assessed/performed  Rachelle Hora, PTA  02/17/2014, 3:48 PM

## 2014-02-19 ENCOUNTER — Ambulatory Visit (HOSPITAL_COMMUNITY): Payer: Medicare Other | Admitting: Physical Therapy

## 2014-03-03 ENCOUNTER — Other Ambulatory Visit: Payer: Self-pay | Admitting: Cardiology

## 2014-05-21 NOTE — Addendum Note (Signed)
Encounter addended by: [name], PT on: 05/21/2014 12:03 PM<BR>     Documentation filed: Notes Section, Episodes

## 2014-05-21 NOTE — Progress Notes (Signed)
  Patient Details  Name: [name] MRN: [id number] Date of Birth: 03-Sep-1931  Today's Date: 05/21/2014 Pt seen for 4/8 visits did not return since 02/17/2014 will discharge.  [name] 05/21/2014, 12:01 PM

## 2014-05-24 ENCOUNTER — Other Ambulatory Visit: Payer: Self-pay | Admitting: Neurology

## 2014-08-11 NOTE — Addendum Note (Signed)
Encounter addended by: Debby Bud, OT on: 08/11/2014  1:19 PM<BR>     Documentation filed: Episodes

## 2014-09-03 ENCOUNTER — Other Ambulatory Visit: Payer: Self-pay | Admitting: Neurology

## 2014-10-03 ENCOUNTER — Other Ambulatory Visit: Payer: Self-pay | Admitting: Cardiology

## 2014-10-29 ENCOUNTER — Emergency Department (HOSPITAL_COMMUNITY)
Admission: EM | Admit: 2014-10-29 | Discharge: 2014-10-29 | Disposition: A | Discharge status: Home / Self Care | Payer: Medicare Other | Attending: Emergency Medicine | Admitting: Emergency Medicine

## 2014-10-29 ENCOUNTER — Encounter (HOSPITAL_COMMUNITY): Payer: Self-pay | Admitting: Emergency Medicine

## 2014-10-29 DIAGNOSIS — Z8619 Personal history of other infectious and parasitic diseases: Secondary | ICD-10-CM | POA: Diagnosis not present

## 2014-10-29 DIAGNOSIS — Z7901 Long term (current) use of anticoagulants: Secondary | ICD-10-CM | POA: Insufficient documentation

## 2014-10-29 DIAGNOSIS — G8929 Other chronic pain: Secondary | ICD-10-CM | POA: Insufficient documentation

## 2014-10-29 DIAGNOSIS — Z86711 Personal history of pulmonary embolism: Secondary | ICD-10-CM | POA: Diagnosis not present

## 2014-10-29 DIAGNOSIS — I5032 Chronic diastolic (congestive) heart failure: Secondary | ICD-10-CM | POA: Insufficient documentation

## 2014-10-29 DIAGNOSIS — Z791 Long term (current) use of non-steroidal anti-inflammatories (NSAID): Secondary | ICD-10-CM | POA: Insufficient documentation

## 2014-10-29 DIAGNOSIS — R51 Headache: Secondary | ICD-10-CM | POA: Diagnosis not present

## 2014-10-29 DIAGNOSIS — J441 Chronic obstructive pulmonary disease with (acute) exacerbation: Secondary | ICD-10-CM | POA: Diagnosis not present

## 2014-10-29 DIAGNOSIS — M549 Dorsalgia, unspecified: Secondary | ICD-10-CM | POA: Insufficient documentation

## 2014-10-29 DIAGNOSIS — I10 Essential (primary) hypertension: Secondary | ICD-10-CM | POA: Diagnosis not present

## 2014-10-29 DIAGNOSIS — Z8719 Personal history of other diseases of the digestive system: Secondary | ICD-10-CM | POA: Diagnosis not present

## 2014-10-29 DIAGNOSIS — Z8639 Personal history of other endocrine, nutritional and metabolic disease: Secondary | ICD-10-CM | POA: Diagnosis not present

## 2014-10-29 DIAGNOSIS — D649 Anemia, unspecified: Secondary | ICD-10-CM | POA: Insufficient documentation

## 2014-10-29 DIAGNOSIS — Z7952 Long term (current) use of systemic steroids: Secondary | ICD-10-CM | POA: Insufficient documentation

## 2014-10-29 DIAGNOSIS — Z79899 Other long term (current) drug therapy: Secondary | ICD-10-CM | POA: Insufficient documentation

## 2014-10-29 DIAGNOSIS — I4891 Unspecified atrial fibrillation: Secondary | ICD-10-CM | POA: Insufficient documentation

## 2014-10-29 DIAGNOSIS — Z7951 Long term (current) use of inhaled steroids: Secondary | ICD-10-CM | POA: Insufficient documentation

## 2014-10-29 DIAGNOSIS — M1990 Unspecified osteoarthritis, unspecified site: Secondary | ICD-10-CM | POA: Diagnosis not present

## 2014-10-29 DIAGNOSIS — R21 Rash and other nonspecific skin eruption: Secondary | ICD-10-CM | POA: Insufficient documentation

## 2014-10-29 DIAGNOSIS — K219 Gastro-esophageal reflux disease without esophagitis: Secondary | ICD-10-CM | POA: Diagnosis not present

## 2014-10-29 DIAGNOSIS — Z87891 Personal history of nicotine dependence: Secondary | ICD-10-CM | POA: Insufficient documentation

## 2014-10-29 MED ORDER — HYDROXYZINE HCL 25 MG PO TABS
25.0000 mg | ORAL_TABLET | Freq: Once | ORAL | Status: AC
  Administered 2014-10-29: 25 mg via ORAL
  Filled 2014-10-29: qty 1

## 2014-10-29 MED ORDER — HYDROXYZINE HCL 25 MG PO TABS: ORAL_TABLET | ORAL | Status: DC

## 2014-10-29 MED ORDER — PREDNISONE 10 MG PO TABS: ORAL_TABLET | ORAL | Status: DC

## 2014-10-29 MED ORDER — PREDNISONE 50 MG PO TABS
60.0000 mg | ORAL_TABLET | Freq: Once | ORAL | Status: AC
  Administered 2014-10-29: 14:00:00 60 mg via ORAL
  Filled 2014-10-29 (×2): qty 1

## 2014-10-29 NOTE — ED Provider Notes (Signed)
CSN: [id number]     Arrival date & time 10/29/14  1322 History   First MD Initiated Contact with Patient 10/29/14 1343     Chief Complaint  Patient presents with  . Rash     (Consider location/radiation/quality/duration/timing/severity/associated sxs/prior Treatment) HPI Comments: Patient is an 80 year old male who presents to the emergency department with a complaint of rash. The patient states that approximately 4 days ago he noticed a red itchy rash mostly on his arms. He now has rash on his back, his arms, his thighs, in his lower legs. He says at times daily like little hives, and other times they're just raised red areas. It is of note that the patient has psoriasis. Patient states he is bothered with this from time to time but has not had a problem recently. The patient denies any new foods, or dryer sheets, detergent, soaps, clothing, or new environment. Patient states he has been working with a different type of paint, but states that he has never had any reaction to a pain or the chemicals in a paint. He's not had any problems breathing, or facial swelling.  Patient is a 80 y.o. male presenting with rash. The history is provided by the patient.  Rash Severity:  Moderate Associated symptoms: headaches and shortness of breath   Associated symptoms: no abdominal pain, no joint pain and not wheezing     Past Medical History  Diagnosis Date  . COPD (chronic obstructive pulmonary disease)     Uses occasional nighttime O2  . Disseminated herpes zoster 2010  . GERD (gastroesophageal reflux disease)   . Asthma   . Pulmonary embolus     2003 and 2011  . Pulmonary nodules     Chest CT 09/11  . Mixed hyperlipidemia   . Atrial fibrillation     CHADS2 score 2  . Lupus anticoagulant positive   . Essential hypertension, benign   . Cholelithiasis   . Rheumatoid arthritis(714.0)   . History of chicken pox 1941; 2011  . Anemia   . Chronic lower back pain   . Chronic diastolic heart  failure   . Cirrhosis     Questionable, AFP normal Feb 01/2012, hx of ETOH use   . Headache(784.0) 10/21/2013  . Cervical spondylosis without myelopathy 10/21/2013   Past Surgical History  Procedure Laterality Date  . Polypectomy  02/02/2012    RMR: Multiple colonic polyps removed, flat, tubular adenomas/ Left-sided diverticulosis  . Posterior lumbar vertebrae excision  2003; 2009; 2011  . Myringotomy      "3 times; both ears"  . Cataract extraction w/ intraocular lens  implant, bilateral    . Cholecystectomy  05/23/2012    Procedure: LAPAROSCOPIC CHOLECYSTECTOMY WITH INTRAOPERATIVE CHOLANGIOGRAM;  Surgeon: Stark Klein, MD;  Location: [facility];  Service: General;  Laterality: N/A;  . Esophagogastroduodenoscopy  02/02/12    [id number] size hiatal hernia; otherwise normal exam  . Colonoscopy  01/24/2013    Procedure: COLONOSCOPY;  Surgeon: Daneil Dolin, MD;  Location: [facility];  Service: Endoscopy;  Laterality: N/A;  8:30  . Back surgery     Family History  Problem Relation Age of Onset  . Colon cancer Neg Hx   . Liver disease Neg Hx    History  Substance Use Topics  . Smoking status: Former Smoker -- 1.00 packs/day for 57 years    Types: Cigarettes    Quit date: 01/27/1998  . Smokeless tobacco: Never Used  . Alcohol Use: No  Comment: "quit drinking 1976"    Review of Systems  Constitutional: Negative for activity change.       All ROS Neg except as noted in HPI  Eyes: Negative for photophobia and discharge.  Respiratory: Positive for shortness of breath. Negative for cough and wheezing.   Cardiovascular: Positive for palpitations. Negative for chest pain.  Gastrointestinal: Negative for abdominal pain and blood in stool.  Genitourinary: Negative for dysuria, frequency and hematuria.  Musculoskeletal: Positive for back pain. Negative for arthralgias and neck pain.  Skin: Positive for rash.  Neurological: Positive for headaches. Negative for dizziness, seizures and  speech difficulty.  Psychiatric/Behavioral: Negative for hallucinations and confusion.      Allergies  Cymbalta; Procaine hcl; and Bystolic  Home Medications   Prior to Admission medications   Medication Sig Start Date End Date Taking? Authorizing Provider  albuterol (PROAIR HFA) 108 (90 BASE) MCG/ACT inhaler Inhale 2 puffs into the lungs every 6 (six) hours as needed. Shortness of Breath    Historical Provider, MD  albuterol (PROVENTIL) (2.5 MG/3ML) 0.083% nebulizer solution Take 2.5 mg by nebulization every 6 (six) hours as needed. Shortness of Breath    Historical Provider, MD  cetirizine (ZYRTEC) 10 MG tablet Take 10 mg by mouth daily.      Historical Provider, MD  cloNIDine (CATAPRES) 0.1 MG tablet Take 0.1 mg by mouth 2 (two) times daily.  07/02/13   Historical Provider, MD  clotrimazole-betamethasone (LOTRISONE) cream Apply 1 application topically. 09/27/13   Historical Provider, MD  [place] 250 MCG tablet TAKE ONE TABLET BY MOUTH ONCE DAILY 10/03/14   Satira Sark, MD  diltiazem (CARDIZEM CD) 300 MG 24 hr capsule Take 300 mg by mouth at bedtime. 07/16/13   Satira Sark, MD  diltiazem (CARDIZEM CD) 300 MG 24 hr capsule TAKE ONE CAPSULE BY MOUTH ONCE DAILY 10/03/14   Satira Sark, MD  ferrous gluconate (FERGON) 325 MG tablet Take 325 mg by mouth daily with breakfast.    Historical Provider, MD  fluticasone (FLONASE) 50 MCG/ACT nasal spray Place 2 sprays into the nose as needed for allergies.  07/02/13   Historical Provider, MD  folic acid (FOLVITE) 1 MG tablet Take 1 mg by mouth daily.     Historical Provider, MD  furosemide (LASIX) 40 MG tablet Take 40 mg by mouth 2 (two) times daily.  05/21/13   Historical Provider, MD  gabapentin (NEURONTIN) 300 MG capsule TAKE ONE CAPSULE BY MOUTH EVERY MORNING AND TWO CAPSULES EVERY EVENING 09/03/14   Kathrynn Ducking, MD  HYDROcodone-acetaminophen [facility]) 10-325 MG per tablet Take 1 tablet by mouth 2 (two) times daily. 09/03/13   Historical  Provider, MD  hydrOXYzine (ATARAX/VISTARIL) 25 MG tablet 1 at hs, or q6h prn itching. 10/29/14   Lenox Ahr, PA-C  ketoconazole (NIZORAL) 2 % shampoo Apply 1 application topically 2 (two) times a week.  05/21/13   Historical Provider, MD  methocarbamol (ROBAXIN) 500 MG tablet Take 1 tablet by mouth 2 (two) times daily. 09/02/13   Historical Provider, MD  methotrexate (RHEUMATREX) 2.5 MG tablet Take 20 mg by mouth once a week. *TAKE 8 TABLETS BY MOUTH FOR A TOTAL 20MG  WEEKLY Patient takes 4 tablets (10Mg ) on Fridays and 4 tablets (10mg ) on Saturdays    Historical Provider, MD  naproxen (NAPROSYN) 500 MG tablet Take 1 tablet (500 mg total) by mouth 2 (two) times daily with a meal. 08/29/13   Johnna Acosta, MD  Omega-3 Fatty Acids ([place])  1000 MG CAPS Take 2 capsules by mouth 2 (two) times daily.     Historical Provider, MD  omeprazole (PRILOSEC) 20 MG capsule Take 20 mg by mouth 2 (two) times daily.     Historical Provider, MD  ondansetron (ZOFRAN ODT) 4 MG disintegrating tablet Take 1 tablet (4 mg total) by mouth every 8 (eight) hours as needed for nausea. 07/05/13   Janice Norrie, MD  oxyCODONE-acetaminophen (PERCOCET) 10-325 MG per tablet Take 1 tablet by mouth every 6 (six) hours as needed for pain. 05/25/12   Verlee Monte, MD  potassium chloride SA (K-DUR,KLOR-CON) 20 MEQ tablet Take 20 mEq by mouth 2 (two) times daily.  07/02/13   Historical Provider, MD  predniSONE (DELTASONE) 10 MG tablet 5,4,3,2,1 - take with food 10/29/14   Lenox Ahr, PA-C  rizatriptan (MAXALT) 10 MG tablet  12/27/13   Historical Provider, MD  spironolactone (ALDACTONE) 25 MG tablet Take 25 mg by mouth daily. 08/21/13   Historical Provider, MD  topiramate (TOPAMAX) 50 MG tablet  12/27/13   Historical Provider, MD  VOLTAREN 1 % GEL Apply 2 g topically daily as needed (pain).  05/21/13   Historical Provider, MD  warfarin (COUMADIN) 2 MG tablet Take 5 mg by mouth See admin instructions. Takes 5 mg (2.5 tablets) daily except on  Sundays doesn't take at all (MANAGED BY PMD)    Historical Provider, MD   BP 124/70 mmHg  Pulse 79  Temp(Src) 98 F (36.7 C) (Oral)  Resp 17  Ht 5\' 8"  (1.727 m)  Wt 181 lb (82.101 kg)  BMI 27.53 kg/m2  SpO2 98% Physical Exam  Constitutional: He is oriented to person, place, and time. He appears well-developed and well-nourished.  Non-toxic appearance.  HENT:  Head: Normocephalic.  Right Ear: Tympanic membrane and external ear normal.  Left Ear: Tympanic membrane and external ear normal.  The airway is patent. There is no facial swelling, no tongue swelling, no other facial problem.  Eyes: EOM and lids are normal. Pupils are equal, round, and reactive to light.  Neck: Normal range of motion. Neck supple. Carotid bruit is not present.  Cardiovascular: Normal rate, regular rhythm, normal heart sounds, intact distal pulses and normal pulses.   Pulmonary/Chest: Breath sounds normal. No respiratory distress.  Course breath sounds noted on lung auscultation. There is no consolidation appreciated. Patient speaks in complete sentences without any problem whatsoever.  Abdominal: Soft. Bowel sounds are normal. There is no tenderness. There is no guarding.  Musculoskeletal: Normal range of motion.  Lymphadenopathy:       Head (right side): No submandibular adenopathy present.       Head (left side): No submandibular adenopathy present.    He has no cervical adenopathy.  Neurological: He is alert and oriented to person, place, and time. He has normal strength. No cranial nerve deficit or sensory deficit.  Skin: Skin is warm and dry.  Hives type rash on the right and left lateral thigh area, and a few on the mid back area.  There is a red raised macular rash on the back, forearms, mid thigh area and lower leg.  Psychiatric: He has a normal mood and affect. His speech is normal.  Nursing note and vitals reviewed.   ED Course  Procedures (including critical care time) Labs Review Labs  Reviewed - No data to display  Imaging Review No results found.   EKG Interpretation None      MDM  Patient has a four-day rash. They  only new change is the patient having been exposed to a different type of paint recently. Patient denies any problems with breathing, cough, congestion, difficulty swallowing, or any other changes. The plan at this time is for the patient to be placed on Vistaril and prednisone taper. Patient is referred to allergy specialist if not improving.   Final diagnoses:  Rash    **I have reviewed nursing notes, vital signs, and all appropriate lab and imaging results for this patient.Lenox Ahr, PA-C 10/29/14 Amory Alvino Chapel, MD 10/31/14 2151

## 2014-10-29 NOTE — ED Notes (Signed)
Notice rash four days ago.  Rash is to entire body.  C/o itching.  Have only used "anti-itch cream" on yesterday, with mild relief.

## 2014-10-29 NOTE — Discharge Instructions (Signed)
Please see the allergy specialist listed above if your rash worsens. Please use the prednisone taper daily until all taken. Use Allegra 180 mg tablets (over-the-counter) each morning, use Vistaril at bedtime. May also use Vistaril every 6 hours if needed for severe itching. Please return to the emergency department if any unusual shortness of breath, cough, difficulty breathing, facial swelling, or signs of an acute reaction.

## 2014-11-05 ENCOUNTER — Other Ambulatory Visit: Payer: Self-pay | Admitting: Cardiology

## 2014-11-12 ENCOUNTER — Encounter: Payer: Self-pay | Admitting: Neurology

## 2014-11-18 ENCOUNTER — Encounter: Payer: Self-pay | Admitting: Neurology

## 2014-12-05 ENCOUNTER — Other Ambulatory Visit: Payer: Self-pay | Admitting: Cardiology

## 2014-12-31 ENCOUNTER — Ambulatory Visit (INDEPENDENT_AMBULATORY_CARE_PROVIDER_SITE_OTHER): Payer: Medicare Other | Admitting: Cardiology

## 2014-12-31 ENCOUNTER — Encounter: Payer: Self-pay | Admitting: Cardiology

## 2014-12-31 ENCOUNTER — Other Ambulatory Visit: Payer: Self-pay | Admitting: *Deleted

## 2014-12-31 VITALS — BP 88/53 | HR 64 | Ht 68.0 in | Wt 198.8 lb

## 2014-12-31 DIAGNOSIS — I482 Chronic atrial fibrillation, unspecified: Secondary | ICD-10-CM

## 2014-12-31 DIAGNOSIS — I10 Essential (primary) hypertension: Secondary | ICD-10-CM

## 2014-12-31 MED ORDER — DIGOXIN 250 MCG PO TABS: 250.0000 ug | ORAL_TABLET | Freq: Every day | ORAL | Status: DC

## 2014-12-31 MED ORDER — CLONIDINE HCL 0.1 MG PO TABS: 0.1000 mg | ORAL_TABLET | Freq: Every day | ORAL | Status: AC

## 2014-12-31 MED ORDER — DILTIAZEM HCL ER COATED BEADS 300 MG PO CP24: 300.0000 mg | ORAL_CAPSULE | Freq: Every day | ORAL | Status: DC

## 2014-12-31 NOTE — Assessment & Plan Note (Signed)
Chronic, continue strategy of heart rate control and anticoagulation. Coumadin is followed by Dr. Edrick Oh.

## 2014-12-31 NOTE — Assessment & Plan Note (Signed)
Blood pressure running somewhat low. We will try to wean him off clonidine, otherwise continue Cardizem CD as this is also assisting with atrial fibrillation rate control. Keep follow-up with Dr. Edrick Oh.

## 2014-12-31 NOTE — Addendum Note (Signed)
Addended by: Merlene Laughter on: 12/31/2014 04:57 PM   Modules accepted: Orders

## 2014-12-31 NOTE — Patient Instructions (Signed)
Your physician recommends that you schedule a follow-up appointment in: 1 year. You will receive a reminder letter in the mail in about 10 months reminding you to call and schedule your appointment. If you don't receive this letter, please contact our office. Your physician has recommended you make the following change in your medication:  DECREASE CLONIDINE TO 0.1 MG DAILY FOR 1 WEEK; THEN STOP CLONIDINE. Continue all other medications the same.

## 2014-12-31 NOTE — Progress Notes (Signed)
Reason for visit: Atrial fibrillation  Clinical Summary [name] is an 81 y.o.male last seen in January 2015. He comes in for a routine visit today. Reports no regular sense of palpitations or chest pain. States that he has been compliant with Cardizem CD and Coumadin (followed by Dr. Edrick Oh). He denies any bleeding episodes.  States blood pressure typically runs "low." I reviewed his chart. We talked about trying to wean off clonidine.  Echocardiogram from June 2014 demonstrated mild LVH with LVEF 70-75%, very mild aortic stenosis, moderate left atrial enlargement with trivial mitral regurgitation, mildly dilated right ventricle with normal function, elevated CVP, no pericardial effusion.  Allergies  Allergen Reactions  . Cymbalta [Duloxetine Hcl] Other (See Comments)    Confusion   . Procaine Hcl Hives and Other (See Comments)    NOVOCAINE: Sweating, Confusion, Not in right state of mind.  [name]    unknown    Current Outpatient Prescriptions  Medication Sig Dispense Refill  . albuterol (PROAIR HFA) 108 (90 BASE) MCG/ACT inhaler Inhale 2 puffs into the lungs every 6 (six) hours as needed. Shortness of Breath    . albuterol (PROVENTIL) (2.5 MG/3ML) 0.083% nebulizer solution Take 2.5 mg by nebulization every 6 (six) hours as needed. Shortness of Breath    . cetirizine (ZYRTEC) 10 MG tablet Take 10 mg by mouth daily.      . cloNIDine (CATAPRES) 0.1 MG tablet Take 1 tablet (0.1 mg total) by mouth daily. For 1 week; then, STOP CLONIDINE 7 tablet 0  . clotrimazole-betamethasone (LOTRISONE) cream Apply 1 application topically.    Marland Kitchen DIGOX 250 MCG tablet TAKE ONE TABLET BY MOUTH ONCE DAILY. 15 tablet 0  . diltiazem (CARDIZEM CD) 300 MG 24 hr capsule TAKE ONE CAPSULE BY MOUTH ONCE DAILY. 15 capsule 0  . Etanercept (ENBREL) 25 MG/0.5ML SOSY Inject into the skin. Twice a week.    . ferrous gluconate (FERGON) 325 MG tablet Take 325 mg by mouth daily  with breakfast.    . folic acid (FOLVITE) 1 MG tablet Take 1 mg by mouth daily.     . furosemide (LASIX) 40 MG tablet Take 40 mg by mouth 2 (two) times daily.     Marland Kitchen gabapentin (NEURONTIN) 300 MG capsule TAKE ONE CAPSULE BY MOUTH EVERY MORNING AND TWO CAPSULES EVERY EVENING 90 capsule 3  . HYDROcodone-acetaminophen ([place]) 10-325 MG per tablet Take 1 tablet by mouth 2 (two) times daily.    . hydrOXYzine (ATARAX/VISTARIL) 25 MG tablet 1 at hs, or q6h prn itching. 12 tablet 0  . ketoconazole (NIZORAL) 2 % shampoo Apply 1 application topically 2 (two) times a week.     . methocarbamol (ROBAXIN) 500 MG tablet Take 1 tablet by mouth 2 (two) times daily.    . methotrexate (RHEUMATREX) 2.5 MG tablet Take 20 mg by mouth once a week. *TAKE 8 TABLETS BY MOUTH FOR A TOTAL 20MG  WEEKLY Patient takes 4 tablets (10Mg ) on Fridays and 4 tablets (10mg ) on Saturdays    . naproxen (NAPROSYN) 500 MG tablet Take 1 tablet (500 mg total) by mouth 2 (two) times daily with a meal. 30 tablet 0  . Omega-3 Fatty Acids (FISH OIL) 1000 MG CAPS Take 2 capsules by mouth 2 (two) times daily.     Marland Kitchen omeprazole (PRILOSEC) 20 MG capsule Take 20 mg by mouth 2 (two) times daily.     . ondansetron (ZOFRAN ODT) 4 MG disintegrating tablet Take 1 tablet (4 mg  total) by mouth every 8 (eight) hours as needed for nausea. 12 tablet 0  . oxyCODONE-acetaminophen (PERCOCET) 10-325 MG per tablet Take 1 tablet by mouth every 6 (six) hours as needed for pain.    . potassium chloride SA (K-DUR,KLOR-CON) 20 MEQ tablet Take 20 mEq by mouth 2 (two) times daily.     . predniSONE (DELTASONE) 10 MG tablet 5,4,3,2,1 - take with food 15 tablet 0  . rizatriptan (MAXALT) 10 MG tablet     . spironolactone (ALDACTONE) 25 MG tablet Take 25 mg by mouth daily.    Marland Kitchen topiramate (TOPAMAX) 50 MG tablet     . VOLTAREN 1 % GEL Apply 2 g topically daily as needed (pain).     Marland Kitchen warfarin (COUMADIN) 2 MG tablet Take 5 mg by mouth See admin instructions. Takes 5 mg (2.5  tablets) daily except on "Sundays doesn't take at all (MANAGED BY PMD)    . fluticasone (FLONASE) 50 MCG/ACT nasal spray Place 2 sprays into the nose as needed for allergies.      No current facility-administered medications for this visit.    Past Medical History  Diagnosis Date  . COPD (chronic obstructive pulmonary disease)     Uses occasional nighttime O2  . Disseminated herpes zoster 2010  . GERD (gastroesophageal reflux disease)   . Asthma   . Pulmonary embolus     20" 03 and 2011  . Pulmonary nodules     Chest CT 09/11  . Mixed hyperlipidemia   . Atrial fibrillation     CHADS2 score 2  . Lupus anticoagulant positive   . Essential hypertension, benign   . Cholelithiasis   . Rheumatoid arthritis(714.0)   . History of chicken pox 1941; 2011  . Anemia   . Chronic lower back pain   . Chronic diastolic heart failure   . Cirrhosis     Questionable, AFP normal Feb 01/2012, hx of ETOH use   . Headache(784.0) 10/21/2013  . Cervical spondylosis without myelopathy 10/21/2013    Social History Mr. Hua reports that he quit smoking about 16 years ago. His smoking use included Cigarettes. He has a 57 pack-year smoking history. He has never used smokeless tobacco. Mr. Cohenour reports that he does not drink alcohol.  Review of Systems Complete review of systems negative except as otherwise outlined in the clinical summary and also the following. Stable appetite. No claudication. Uses a cane, no recent falls.  Physical Examination Filed Vitals:   12/31/14 1057  BP: 88/53  Pulse: 64   Filed Weights   12/31/14 1057  Weight: 198 lb 12.8 oz (90.175 kg)    Bearded male, using a cane to ambulate, comfortable at rest.  HEENT: Conjunctiva and lids normal, oropharynx clear.  Neck: Supple, no carotid bruits, no thyromegaly.  Lungs: Clear to auscultation, diminished, nonlabored breathing at rest.  Cardiac: Irregularly irregular, no S3, 2/6 systolic murmur, no pericardial rub.   Abdomen: Soft, nontender, bowel sounds present.  Extremities: No pitting edema, distal pulses 2+.   Problem List and Plan   Atrial fibrillation Chronic, continue strategy of heart rate control and anticoagulation. Coumadin is followed by Dr. Edrick Oh.  Essential hypertension, benign Blood pressure running somewhat low. We will try to wean him off clonidine, otherwise continue Cardizem CD as this is also assisting with atrial fibrillation rate control. Keep follow-up with Dr. Edrick Oh.    Satira Sark, M.D., F.A.C.C.

## 2015-01-03 ENCOUNTER — Encounter (HOSPITAL_COMMUNITY): Payer: Self-pay | Admitting: *Deleted

## 2015-01-03 ENCOUNTER — Inpatient Hospital Stay (HOSPITAL_COMMUNITY)
Admission: EM | Admit: 2015-01-03 | Discharge: 2015-01-08 | DRG: 603 | Disposition: A | Discharge status: Home / Self Care | Payer: Medicare Other | Attending: Internal Medicine | Admitting: Internal Medicine

## 2015-01-03 DIAGNOSIS — D539 Nutritional anemia, unspecified: Secondary | ICD-10-CM | POA: Diagnosis present

## 2015-01-03 DIAGNOSIS — G8929 Other chronic pain: Secondary | ICD-10-CM | POA: Diagnosis present

## 2015-01-03 DIAGNOSIS — Z7901 Long term (current) use of anticoagulants: Secondary | ICD-10-CM

## 2015-01-03 DIAGNOSIS — D6859 Other primary thrombophilia: Secondary | ICD-10-CM | POA: Diagnosis present

## 2015-01-03 DIAGNOSIS — K7460 Unspecified cirrhosis of liver: Secondary | ICD-10-CM | POA: Diagnosis present

## 2015-01-03 DIAGNOSIS — N179 Acute kidney failure, unspecified: Secondary | ICD-10-CM | POA: Diagnosis present

## 2015-01-03 DIAGNOSIS — L03116 Cellulitis of left lower limb: Secondary | ICD-10-CM | POA: Diagnosis not present

## 2015-01-03 DIAGNOSIS — Z79899 Other long term (current) drug therapy: Secondary | ICD-10-CM

## 2015-01-03 DIAGNOSIS — R760 Raised antibody titer: Secondary | ICD-10-CM | POA: Diagnosis present

## 2015-01-03 DIAGNOSIS — I129 Hypertensive chronic kidney disease with stage 1 through stage 4 chronic kidney disease, or unspecified chronic kidney disease: Secondary | ICD-10-CM | POA: Diagnosis present

## 2015-01-03 DIAGNOSIS — J449 Chronic obstructive pulmonary disease, unspecified: Secondary | ICD-10-CM | POA: Diagnosis present

## 2015-01-03 DIAGNOSIS — M1990 Unspecified osteoarthritis, unspecified site: Secondary | ICD-10-CM | POA: Diagnosis present

## 2015-01-03 DIAGNOSIS — I482 Chronic atrial fibrillation, unspecified: Secondary | ICD-10-CM | POA: Diagnosis present

## 2015-01-03 DIAGNOSIS — M47812 Spondylosis without myelopathy or radiculopathy, cervical region: Secondary | ICD-10-CM | POA: Diagnosis present

## 2015-01-03 DIAGNOSIS — I10 Essential (primary) hypertension: Secondary | ICD-10-CM | POA: Diagnosis present

## 2015-01-03 DIAGNOSIS — J45909 Unspecified asthma, uncomplicated: Secondary | ICD-10-CM | POA: Diagnosis present

## 2015-01-03 DIAGNOSIS — D696 Thrombocytopenia, unspecified: Secondary | ICD-10-CM | POA: Diagnosis present

## 2015-01-03 DIAGNOSIS — E782 Mixed hyperlipidemia: Secondary | ICD-10-CM | POA: Diagnosis present

## 2015-01-03 DIAGNOSIS — K219 Gastro-esophageal reflux disease without esophagitis: Secondary | ICD-10-CM | POA: Diagnosis present

## 2015-01-03 DIAGNOSIS — M7989 Other specified soft tissue disorders: Secondary | ICD-10-CM | POA: Diagnosis not present

## 2015-01-03 DIAGNOSIS — Z87891 Personal history of nicotine dependence: Secondary | ICD-10-CM

## 2015-01-03 DIAGNOSIS — Z7952 Long term (current) use of systemic steroids: Secondary | ICD-10-CM

## 2015-01-03 DIAGNOSIS — N183 Chronic kidney disease, stage 3 unspecified: Secondary | ICD-10-CM | POA: Diagnosis present

## 2015-01-03 DIAGNOSIS — I5032 Chronic diastolic (congestive) heart failure: Secondary | ICD-10-CM | POA: Diagnosis present

## 2015-01-03 DIAGNOSIS — Z79891 Long term (current) use of opiate analgesic: Secondary | ICD-10-CM

## 2015-01-03 DIAGNOSIS — Z86718 Personal history of other venous thrombosis and embolism: Secondary | ICD-10-CM

## 2015-01-03 DIAGNOSIS — Z86711 Personal history of pulmonary embolism: Secondary | ICD-10-CM

## 2015-01-03 DIAGNOSIS — M069 Rheumatoid arthritis, unspecified: Secondary | ICD-10-CM | POA: Diagnosis present

## 2015-01-03 HISTORY — DX: Chronic kidney disease, stage 3 unspecified: N18.30

## 2015-01-03 HISTORY — DX: Chronic kidney disease, stage 3 (moderate): N18.3

## 2015-01-03 HISTORY — DX: Cellulitis of left lower limb: L03.116

## 2015-01-03 LAB — CBC WITH DIFFERENTIAL/PLATELET
BASOS PCT: 1 % (ref 0–1)
Basophils Absolute: 0.1 10*3/uL (ref 0.0–0.1)
EOS PCT: 11 % — AB (ref 0–5)
Eosinophils Absolute: 1.1 10*3/uL — ABNORMAL HIGH (ref 0.0–0.7)
HCT: 35.6 % — ABNORMAL LOW (ref 39.0–52.0)
HEMOGLOBIN: 11.7 g/dL — AB (ref 13.0–17.0)
LYMPHS ABS: 1.7 10*3/uL (ref 0.7–4.0)
Lymphocytes Relative: 18 % (ref 12–46)
MCH: 35.3 pg — ABNORMAL HIGH (ref 26.0–34.0)
MCHC: 32.9 g/dL (ref 30.0–36.0)
MCV: 107.6 fL — ABNORMAL HIGH (ref 78.0–100.0)
MONO ABS: 1.1 10*3/uL — AB (ref 0.1–1.0)
MONOS PCT: 11 % (ref 3–12)
NEUTROS ABS: 5.9 10*3/uL (ref 1.7–7.7)
Neutrophils Relative %: 60 % (ref 43–77)
Platelets: 81 10*3/uL — ABNORMAL LOW (ref 150–400)
RBC: 3.31 MIL/uL — AB (ref 4.22–5.81)
RDW: 16.8 % — ABNORMAL HIGH (ref 11.5–15.5)
WBC: 9.9 10*3/uL (ref 4.0–10.5)

## 2015-01-03 LAB — COMPREHENSIVE METABOLIC PANEL
ALBUMIN: 4.1 g/dL (ref 3.5–5.2)
ALT: 30 U/L (ref 0–53)
AST: 24 U/L (ref 0–37)
Alkaline Phosphatase: 57 U/L (ref 39–117)
Anion gap: 7 (ref 5–15)
BILIRUBIN TOTAL: 1.1 mg/dL (ref 0.3–1.2)
BUN: 26 mg/dL — ABNORMAL HIGH (ref 6–23)
CO2: 33 mmol/L — ABNORMAL HIGH (ref 19–32)
Calcium: 9 mg/dL (ref 8.4–10.5)
Chloride: 96 mEq/L (ref 96–112)
Creatinine, Ser: 1.69 mg/dL — ABNORMAL HIGH (ref 0.50–1.35)
GFR, EST AFRICAN AMERICAN: 42 mL/min — AB (ref >90)
GFR, EST NON AFRICAN AMERICAN: 36 mL/min — AB (ref >90)
Glucose, Bld: 107 mg/dL — ABNORMAL HIGH (ref 70–99)
Potassium: 3.7 mmol/L (ref 3.5–5.1)
SODIUM: 136 mmol/L (ref 135–145)
Total Protein: 7.7 g/dL (ref 6.0–8.3)

## 2015-01-03 LAB — DIGOXIN LEVEL: Digoxin Level: 1.6 ng/mL (ref 0.8–2.0)

## 2015-01-03 LAB — PROTIME-INR
INR: 2.12 — ABNORMAL HIGH (ref 0.00–1.49)
Prothrombin Time: 23.9 seconds — ABNORMAL HIGH (ref 11.6–15.2)

## 2015-01-03 MED ORDER — DIGOXIN 125 MCG PO TABS
250.0000 ug | ORAL_TABLET | Freq: Every morning | ORAL | Status: DC
  Administered 2015-01-04 – 2015-01-06 (×3): 250 ug via ORAL
  Filled 2015-01-03 (×3): qty 2

## 2015-01-03 MED ORDER — CLONIDINE HCL 0.1 MG PO TABS
0.1000 mg | ORAL_TABLET | Freq: Every day | ORAL | Status: DC
Start: 2015-01-04 — End: 2015-01-06
  Administered 2015-01-04 – 2015-01-06 (×3): 0.1 mg via ORAL
  Filled 2015-01-03 (×3): qty 1

## 2015-01-03 MED ORDER — DOCUSATE SODIUM 100 MG PO CAPS: 100.0000 mg | ORAL_CAPSULE | Freq: Every day | ORAL | Status: DC | PRN

## 2015-01-03 MED ORDER — CLINDAMYCIN PHOSPHATE 600 MG/50ML IV SOLN
600.0000 mg | Freq: Three times a day (TID) | INTRAVENOUS | Status: DC
  Administered 2015-01-04 – 2015-01-05 (×4): 600 mg via INTRAVENOUS
  Filled 2015-01-03 (×5): qty 50

## 2015-01-03 MED ORDER — ALUM & MAG HYDROXIDE-SIMETH 200-200-20 MG/5ML PO SUSP: 30.0000 mL | Freq: Four times a day (QID) | ORAL | Status: DC | PRN

## 2015-01-03 MED ORDER — WARFARIN - PHYSICIAN DOSING INPATIENT: Freq: Every day | Status: DC

## 2015-01-03 MED ORDER — ALBUTEROL SULFATE (2.5 MG/3ML) 0.083% IN NEBU: 3.0000 mL | INHALATION_SOLUTION | Freq: Four times a day (QID) | RESPIRATORY_TRACT | Status: DC | PRN

## 2015-01-03 MED ORDER — GABAPENTIN 300 MG PO CAPS
600.0000 mg | ORAL_CAPSULE | Freq: Every day | ORAL | Status: DC
  Administered 2015-01-03 – 2015-01-07 (×5): 600 mg via ORAL
  Filled 2015-01-03 (×5): qty 2

## 2015-01-03 MED ORDER — DICLOFENAC SODIUM 1 % TD GEL
2.0000 g | Freq: Every day | TRANSDERMAL | Status: DC | PRN
Start: 2015-01-03 — End: 2015-01-06

## 2015-01-03 MED ORDER — HYDROCODONE-ACETAMINOPHEN 10-325 MG PO TABS
1.0000 | ORAL_TABLET | Freq: Two times a day (BID) | ORAL | Status: DC
  Administered 2015-01-04 – 2015-01-08 (×10): 1 via ORAL
  Filled 2015-01-03 (×10): qty 1

## 2015-01-03 MED ORDER — POTASSIUM CHLORIDE CRYS ER 20 MEQ PO TBCR
20.0000 meq | EXTENDED_RELEASE_TABLET | Freq: Two times a day (BID) | ORAL | Status: DC
  Administered 2015-01-03 – 2015-01-08 (×10): 20 meq via ORAL
  Filled 2015-01-03 (×10): qty 1

## 2015-01-03 MED ORDER — OXYCODONE-ACETAMINOPHEN 5-325 MG PO TABS
1.0000 | ORAL_TABLET | Freq: Four times a day (QID) | ORAL | Status: DC | PRN
  Administered 2015-01-03 – 2015-01-08 (×8): 1 via ORAL
  Filled 2015-01-03 (×8): qty 1

## 2015-01-03 MED ORDER — ACETAMINOPHEN 650 MG RE SUPP
650.0000 mg | Freq: Four times a day (QID) | RECTAL | Status: DC | PRN
Start: 2015-01-03 — End: 2015-01-08

## 2015-01-03 MED ORDER — DILTIAZEM HCL ER COATED BEADS 120 MG PO CP24
300.0000 mg | ORAL_CAPSULE | Freq: Every evening | ORAL | Status: DC
  Administered 2015-01-04 – 2015-01-07 (×4): 300 mg via ORAL
  Filled 2015-01-03 (×8): qty 1

## 2015-01-03 MED ORDER — OXYCODONE-ACETAMINOPHEN 10-325 MG PO TABS: 1.0000 | ORAL_TABLET | Freq: Four times a day (QID) | ORAL | Status: DC | PRN

## 2015-01-03 MED ORDER — SPIRONOLACTONE 25 MG PO TABS
25.0000 mg | ORAL_TABLET | Freq: Every day | ORAL | Status: DC
  Administered 2015-01-04 – 2015-01-06 (×3): 25 mg via ORAL
  Filled 2015-01-03 (×3): qty 1

## 2015-01-03 MED ORDER — FUROSEMIDE 40 MG PO TABS
40.0000 mg | ORAL_TABLET | Freq: Two times a day (BID) | ORAL | Status: DC
  Administered 2015-01-04: 40 mg via ORAL
  Filled 2015-01-03: qty 1

## 2015-01-03 MED ORDER — OXYCODONE HCL 5 MG PO TABS
5.0000 mg | ORAL_TABLET | Freq: Four times a day (QID) | ORAL | Status: DC | PRN
  Administered 2015-01-04 – 2015-01-08 (×8): 5 mg via ORAL
  Filled 2015-01-03 (×8): qty 1

## 2015-01-03 MED ORDER — WARFARIN SODIUM 5 MG PO TABS
6.0000 mg | ORAL_TABLET | ORAL | Status: DC
  Administered 2015-01-04: 6 mg via ORAL
  Filled 2015-01-03 (×2): qty 1

## 2015-01-03 MED ORDER — METHOTREXATE 2.5 MG PO TABS
10.0000 mg | ORAL_TABLET | ORAL | Status: DC
Start: 2015-01-05 — End: 2015-01-08
  Administered 2015-01-05 – 2015-01-08 (×2): 10 mg via ORAL
  Filled 2015-01-03 (×2): qty 4

## 2015-01-03 MED ORDER — PANTOPRAZOLE SODIUM 40 MG PO TBEC
40.0000 mg | DELAYED_RELEASE_TABLET | Freq: Every day | ORAL | Status: DC
  Administered 2015-01-04 – 2015-01-08 (×5): 40 mg via ORAL
  Filled 2015-01-03 (×5): qty 1

## 2015-01-03 MED ORDER — VANCOMYCIN HCL IN DEXTROSE 1-5 GM/200ML-% IV SOLN
1000.0000 mg | Freq: Once | INTRAVENOUS | Status: AC
  Administered 2015-01-03: 1000 mg via INTRAVENOUS
  Filled 2015-01-03: qty 200

## 2015-01-03 MED ORDER — GABAPENTIN 300 MG PO CAPS
300.0000 mg | ORAL_CAPSULE | Freq: Every day | ORAL | Status: DC
  Administered 2015-01-04 – 2015-01-08 (×5): 300 mg via ORAL
  Filled 2015-01-03 (×5): qty 1

## 2015-01-03 MED ORDER — METHOCARBAMOL 500 MG PO TABS
500.0000 mg | ORAL_TABLET | Freq: Two times a day (BID) | ORAL | Status: DC
  Administered 2015-01-03 – 2015-01-08 (×10): 500 mg via ORAL
  Filled 2015-01-03 (×10): qty 1

## 2015-01-03 MED ORDER — ACETAMINOPHEN 325 MG PO TABS: 650.0000 mg | ORAL_TABLET | Freq: Four times a day (QID) | ORAL | Status: DC | PRN

## 2015-01-03 NOTE — ED Notes (Signed)
Redness noted at iv site. Patient stated he was scratching it. Will notify dr. Nehemiah Settle

## 2015-01-03 NOTE — ED Notes (Signed)
LLE swelling and erythematous rash x 3 days.  Notes that today swelling is the worst it's been and is tender to touch.  Only mild pain w/out applying pressure.  Denies exacerbation of SOB.

## 2015-01-03 NOTE — H&P (Signed)
History and Physical  LADARRYL WRAGE XTK:[id number] DOB: [birth year] DOA: 01/03/2015  Referring physician: Dr. Roderic Palau, ED physician PCP: [name], MD   Chief Complaint: Left leg swelling and pain  HPI: [name] is a 81 y.o. male  With a history of atrial fibrillation on anticoagulation with Coumadin, pulmonary embolism, lupus anticoagulant, GERD, COPD, chronic diastolic heart failure due to initial fibrillation. Patient has a three-day history of worsening redness and swelling and pain in his left lower extremity involving approximately two thirds of his lower leg. The symptoms are becoming worse. Walking increases the pain. No other provoking her. In factors.   Review of Systems:   Pt denies any fevers, chills, nausea, vomiting, chest pain, shortness of breath, dyspnea on exertion, abdominal pain, nausea, vomiting.  Review of systems are otherwise negative  Past Medical History  Diagnosis Date  . COPD (chronic obstructive pulmonary disease)     Uses occasional nighttime O2  . Disseminated herpes zoster 2010  . GERD (gastroesophageal reflux disease)   . Asthma   . Pulmonary embolus     2003 and 2011  . Pulmonary nodules     Chest CT 09/11  . Mixed hyperlipidemia   . Atrial fibrillation     CHADS2 score 2  . Lupus anticoagulant positive   . Essential hypertension, benign   . Cholelithiasis   . Rheumatoid arthritis(714.0)   . History of chicken pox 1941; 2011  . Anemia   . Chronic lower back pain   . Chronic diastolic heart failure   . Cirrhosis     Questionable, AFP normal Feb 01/2012, hx of ETOH use   . Headache(784.0) 10/21/2013  . Cervical spondylosis without myelopathy 10/21/2013   Past Surgical History  Procedure Laterality Date  . Polypectomy  02/02/2012    RMR: Multiple colonic polyps removed, flat, tubular adenomas/ Left-sided diverticulosis  . Posterior lumbar vertebrae excision  2003; 2009; 2011  . Myringotomy      "3 times; both ears"  .  Cataract extraction w/ intraocular lens  implant, bilateral    . Cholecystectomy  05/23/2012    Procedure: LAPAROSCOPIC CHOLECYSTECTOMY WITH INTRAOPERATIVE CHOLANGIOGRAM;  Surgeon: Stark Klein, MD;  Location: [place];  Service: General;  Laterality: N/A;  . Esophagogastroduodenoscopy  02/02/12    [id number]:[id number] size hiatal hernia; otherwise normal exam  . Colonoscopy  01/24/2013    Procedure: COLONOSCOPY;  Surgeon: Daneil Dolin, MD;  [facility];  Service: Endoscopy;  Laterality: N/A;  8:30  . Back surgery     Social History:  reports that he quit smoking about 16 years ago. His smoking use included Cigarettes. He has a 57 pack-year smoking history. He has never used smokeless tobacco. He reports that he does not drink alcohol or use illicit drugs. Patient lives at home & is able to participate in activities of daily living without assistance  Allergies  Allergen Reactions  . Cymbalta [Duloxetine Hcl] Other (See Comments)    Confusion   . Procaine Hcl Hives and Other (See Comments)    NOVOCAINE: Sweating, Confusion, Not in right state of mind.  [name]    unknown    Family History  Problem Relation Age of Onset  . Colon cancer Neg Hx   . Liver disease Neg Hx      Prior to Admission medications   Medication Sig Start Date End Date Taking? Authorizing Provider  albuterol (PROAIR HFA) 108 (90 BASE) MCG/ACT inhaler Inhale 2 puffs  into the lungs every 6 (six) hours as needed. Shortness of Breath   Yes Historical Provider, MD  albuterol (PROVENTIL) (2.5 MG/3ML) 0.083% nebulizer solution Take 2.5 mg by nebulization every 6 (six) hours as needed. Shortness of Breath   Yes Historical Provider, MD  cetirizine (ZYRTEC) 10 MG tablet Take 10 mg by mouth daily.     Yes Historical Provider, MD  cloNIDine (CATAPRES) 0.1 MG tablet Take 1 tablet (0.1 mg total) by mouth daily. For 1 week; then, STOP CLONIDINE 12/31/14 01/07/15 Yes Satira Sark, MD    clotrimazole-betamethasone (LOTRISONE) cream Apply 1 application topically daily.  09/27/13  Yes Historical Provider, MD  digoxin (DIGOX) 0.25 MG tablet Take 1 tablet (250 mcg total) by mouth daily. Patient taking differently: Take 250 mcg by mouth every morning.  12/31/14  Yes Satira Sark, MD  diltiazem (CARDIZEM CD) 300 MG 24 hr capsule Take 1 capsule (300 mg total) by mouth daily. Patient taking differently: Take 300 mg by mouth every evening.  12/31/14  Yes Satira Sark, MD  Etanercept (ENBREL) 25 MG/0.5ML SOSY Inject into the skin 2 (two) times a week. Twice a week.   Yes Historical Provider, MD  fluticasone (FLONASE) 50 MCG/ACT nasal spray Place 2 sprays into the nose as needed for allergies.  07/02/13  Yes Historical Provider, MD  folic acid (FOLVITE) 1 MG tablet Take 1 mg by mouth daily.    Yes Historical Provider, MD  furosemide (LASIX) 40 MG tablet Take 40 mg by mouth 2 (two) times daily. *Sometimes takes two tablets in the morning and one tablet in the evening as regimen depending on fluid retention 05/21/13  Yes Historical Provider, MD  gabapentin (NEURONTIN) 300 MG capsule TAKE ONE CAPSULE BY MOUTH EVERY MORNING AND TWO CAPSULES EVERY EVENING 09/03/14  Yes Kathrynn Ducking, MD  HYDROcodone-acetaminophen (NORCO) 10-325 MG per tablet Take 1 tablet by mouth 2 (two) times daily. 09/03/13  Yes Historical Provider, MD  ketoconazole (NIZORAL) 2 % shampoo Apply 1 application topically 3 (three) times a week.  05/21/13  Yes Historical Provider, MD  methocarbamol (ROBAXIN) 500 MG tablet Take 1 tablet by mouth 2 (two) times daily. 09/02/13  Yes Historical Provider, MD  methotrexate (RHEUMATREX) 2.5 MG tablet Take 10 mg by mouth 2 (two) times a week. *TAKE 8 TABLETS BY MOUTH FOR A TOTAL 20MG  WEEKLY Patient takes 4 tablets (10Mg ) on Fridays and 4 tablets (10mg ) on Saturdays   Yes Historical Provider, MD  mometasone (NASONEX) 50 MCG/ACT nasal spray Place 2 sprays into the nose daily.   Yes Historical  Provider, MD  Omega-3 Fatty Acids (FISH OIL) 1000 MG CAPS Take 2 capsules by mouth 2 (two) times daily.    Yes Historical Provider, MD  omeprazole (PRILOSEC) 20 MG capsule Take 20 mg by mouth 2 (two) times daily.    Yes Historical Provider, MD  oxyCODONE-acetaminophen (PERCOCET) 10-325 MG per tablet Take 1 tablet by mouth every 6 (six) hours as needed for pain. 05/25/12  Yes Verlee Monte, MD  potassium chloride SA (K-DUR,KLOR-CON) 20 MEQ tablet Take 20 mEq by mouth 2 (two) times daily.  07/02/13  Yes Historical Provider, MD  spironolactone (ALDACTONE) 25 MG tablet Take 25 mg by mouth daily. 08/21/13  Yes Historical Provider, MD  VOLTAREN 1 % GEL Apply 2 g topically daily as needed (pain).  05/21/13  Yes Historical Provider, MD  warfarin (COUMADIN) 2 MG tablet Take 6 mg by mouth See admin instructions. Takes 6 mg (3 tablets) daily  except on Wednesdays doesn't take at all (MANAGED BY PMD)   Yes Historical Provider, MD  hydrOXYzine (ATARAX/VISTARIL) 25 MG tablet 1 at hs, or q6h prn itching. Patient not taking: Reported on 01/03/2015 10/29/14   Lenox Ahr, PA-C  naproxen (NAPROSYN) 500 MG tablet Take 1 tablet (500 mg total) by mouth 2 (two) times daily with a meal. Patient not taking: Reported on 01/03/2015 08/29/13   Johnna Acosta, MD  ondansetron (ZOFRAN ODT) 4 MG disintegrating tablet Take 1 tablet (4 mg total) by mouth every 8 (eight) hours as needed for nausea. Patient not taking: Reported on 01/03/2015 07/05/13   Janice Norrie, MD  predniSONE (DELTASONE) 10 MG tablet 5,4,3,2,1 - take with food Patient not taking: Reported on 01/03/2015 10/29/14   Lenox Ahr, PA-C    Physical Exam: BP 117/93 mmHg  Pulse 80  Temp(Src) 98.4 F (36.9 C) (Oral)  Resp 18  Ht 5\' 8"  (1.727 m)  Wt 90.266 kg (199 lb)  BMI 30.26 kg/m2  SpO2 99%  General: Elderly Caucasian male. Awake and alert and oriented x3. No acute cardiopulmonary distress.  Eyes: Pupils equal, round, reactive to light. Extraocular muscles are  intact. Sclerae anicteric and noninjected.  ENT:  Moist mucosal membranes. No mucosal lesions. Neck: Neck supple without lymphadenopathy. No carotid bruits. No masses palpated.  Cardiovascular: Regular rate with normal S1-S2 sounds. No murmurs, rubs, gallops auscultated. No JVD.  Respiratory: Good respiratory effort with no wheezes, rales, rhonchi. Lungs clear to auscultation bilaterally.  Abdomen: Soft, nontender, nondistended. Active bowel sounds. No masses or hepatosplenomegaly  Skin: Dry, warm to touch. 2+ dorsalis pedis and radial pulses. Erythema involving the distal two thirds of his left lower leg with mild nonpitting edema and skin exquisitely tender to touch. Musculoskeletal: No calf or leg pain. All major joints not erythematous nontender.  Psychiatric: Intact judgment and insight.  Neurologic: No focal neurological deficits. Cranial nerves II through XII are grossly intact.           Labs on Admission:  Basic Metabolic Panel:  Recent Labs Lab 01/03/15 2005  NA 136  K 3.7  CL 96  CO2 33*  GLUCOSE 107*  BUN 26*  CREATININE 1.69*  CALCIUM 9.0   Liver Function Tests:  Recent Labs Lab 01/03/15 2005  AST 24  ALT 30  ALKPHOS 57  BILITOT 1.1  PROT 7.7  ALBUMIN 4.1   No results for input(s): LIPASE, AMYLASE in the last 168 hours. No results for input(s): AMMONIA in the last 168 hours. CBC:  Recent Labs Lab 01/03/15 2005  WBC 9.9  NEUTROABS 5.9  HGB 11.7*  HCT 35.6*  MCV 107.6*  PLT 81*   Cardiac Enzymes: No results for input(s): CKTOTAL, CKMB, CKMBINDEX, TROPONINI in the last 168 hours.  BNP (last 3 results) No results for input(s): PROBNP in the last 8760 hours. CBG: No results for input(s): GLUCAP in the last 168 hours.  Radiological Exams on Admission: No results found.  EKG: Independently reviewed. Rate controlled atrial fibrillation. Right Branch block  Assessment/Plan Present on Admission:  . Cellulitis  #1 cellulitis #2 chronic atrial  fibrillation #3 COPD #4 hypercoagulable state #5 chronic anticoagulation #6 chronic pain due to rheumatoid arthritis  Admit for observation Clindamycin 600 mg 3 times a day Immunologic predispose patient to infection MRSA screen Home medications Doubtful that this represents blood clot, given the patient's INR in therapeutic range If not improving, would recommend ultrasound  DVT prophylaxis: Coumadin  Consultants: None  Code Status: Full code  Family Communication: None   Disposition Plan: Home following treatment  Time spent: 50 minutes was spent with face-to-face time with patient with at least 50% with counseling and coordination of care  Loma Boston, DO [facility] Pager [phone]

## 2015-01-03 NOTE — ED Provider Notes (Signed)
CSN: [id number]     Arrival date & time 01/03/15  1443 History   First MD Initiated Contact with Patient 01/03/15 1948     Chief Complaint  Patient presents with  . Leg Swelling     (Consider location/radiation/quality/duration/timing/severity/associated sxs/prior Treatment) Patient is a 81 y.o. male presenting with leg pain. The history is provided by the patient (pt complains of pain and swelling and rash to left lower leg).  Leg Pain Location:  Leg Injury: no   Leg location:  L leg Pain details:    Quality:  Aching   Radiates to:  Does not radiate   Severity:  Mild Chronicity:  New Dislocation: no   Associated symptoms: no back pain and no fatigue     Past Medical History  Diagnosis Date  . COPD (chronic obstructive pulmonary disease)     Uses occasional nighttime O2  . Disseminated herpes zoster 2010  . GERD (gastroesophageal reflux disease)   . Asthma   . Pulmonary embolus     2003 and 2011  . Pulmonary nodules     Chest CT 09/11  . Mixed hyperlipidemia   . Atrial fibrillation     CHADS2 score 2  . Lupus anticoagulant positive   . Essential hypertension, benign   . Cholelithiasis   . Rheumatoid arthritis(714.0)   . History of chicken pox 1941; 2011  . Anemia   . Chronic lower back pain   . Chronic diastolic heart failure   . Cirrhosis     Questionable, AFP normal Feb 01/2012, hx of ETOH use   . Headache(784.0) 10/21/2013  . Cervical spondylosis without myelopathy 10/21/2013   Past Surgical History  Procedure Laterality Date  . Polypectomy  02/02/2012    RMR: Multiple colonic polyps removed, flat, tubular adenomas/ Left-sided diverticulosis  . Posterior lumbar vertebrae excision  2003; 2009; 2011  . Myringotomy      "3 times; both ears"  . Cataract extraction w/ intraocular lens  implant, bilateral    . Cholecystectomy  05/23/2012    Procedure: LAPAROSCOPIC CHOLECYSTECTOMY WITH INTRAOPERATIVE CHOLANGIOGRAM;  Surgeon: Stark Klein, MD;  Location: [place];   Service: General;  Laterality: N/A;  . Esophagogastroduodenoscopy  02/02/12    [id number] size hiatal hernia; otherwise normal exam  . Colonoscopy  01/24/2013    Procedure: COLONOSCOPY;  Surgeon: Daneil Dolin, MD;  [facility]: [facility];  Service: Endoscopy;  Laterality: N/A;  8:30  . Back surgery     Family History  Problem Relation Age of Onset  . Colon cancer Neg Hx   . Liver disease Neg Hx    History  Substance Use Topics  . Smoking status: Former Smoker -- 1.00 packs/day for 57 years    Types: Cigarettes    Quit date: 01/27/1998  . Smokeless tobacco: Never Used  . Alcohol Use: No     Comment: "quit drinking 1976"    Review of Systems  Constitutional: Negative for appetite change and fatigue.  HENT: Negative for congestion, ear discharge and sinus pressure.   Eyes: Negative for discharge.  Respiratory: Negative for cough.   Cardiovascular: Negative for chest pain.  Gastrointestinal: Negative for abdominal pain and diarrhea.  Genitourinary: Negative for frequency and hematuria.  Musculoskeletal: Negative for back pain.       Rash left lower leg  Skin: Negative for rash.  Neurological: Negative for seizures and headaches.  Psychiatric/Behavioral: Negative for hallucinations.      Allergies  Cymbalta; Procaine hcl; and Bystolic  Home Medications  Prior to Admission medications   Medication Sig Start Date End Date Taking? Authorizing Provider  albuterol (PROAIR HFA) 108 (90 BASE) MCG/ACT inhaler Inhale 2 puffs into the lungs every 6 (six) hours as needed. Shortness of Breath   Yes Historical Provider, MD  albuterol (PROVENTIL) (2.5 MG/3ML) 0.083% nebulizer solution Take 2.5 mg by nebulization every 6 (six) hours as needed. Shortness of Breath   Yes Historical Provider, MD  cetirizine (ZYRTEC) 10 MG tablet Take 10 mg by mouth daily.     Yes Historical Provider, MD  cloNIDine (CATAPRES) 0.1 MG tablet Take 1 tablet (0.1 mg total) by mouth daily. For 1 week; then,  STOP CLONIDINE 12/31/14 01/07/15 Yes Satira Sark, MD  clotrimazole-betamethasone (LOTRISONE) cream Apply 1 application topically daily.  09/27/13  Yes Historical Provider, MD  digoxin (DIGOX) 0.25 MG tablet Take 1 tablet (250 mcg total) by mouth daily. Patient taking differently: Take 250 mcg by mouth every morning.  12/31/14  Yes Satira Sark, MD  diltiazem (CARDIZEM CD) 300 MG 24 hr capsule Take 1 capsule (300 mg total) by mouth daily. Patient taking differently: Take 300 mg by mouth every evening.  12/31/14  Yes Satira Sark, MD  Etanercept (ENBREL) 25 MG/0.5ML SOSY Inject into the skin 2 (two) times a week. Twice a week.   Yes Historical Provider, MD  fluticasone (FLONASE) 50 MCG/ACT nasal spray Place 2 sprays into the nose as needed for allergies.  07/02/13  Yes Historical Provider, MD  folic acid (FOLVITE) 1 MG tablet Take 1 mg by mouth daily.    Yes Historical Provider, MD  furosemide (LASIX) 40 MG tablet Take 40 mg by mouth 2 (two) times daily. *Sometimes takes two tablets in the morning and one tablet in the evening as regimen depending on fluid retention 05/21/13  Yes Historical Provider, MD  gabapentin (NEURONTIN) 300 MG capsule TAKE ONE CAPSULE BY MOUTH EVERY MORNING AND TWO CAPSULES EVERY EVENING 09/03/14  Yes Kathrynn Ducking, MD  HYDROcodone-acetaminophen (NORCO) 10-325 MG per tablet Take 1 tablet by mouth 2 (two) times daily. 09/03/13  Yes Historical Provider, MD  ketoconazole (NIZORAL) 2 % shampoo Apply 1 application topically 3 (three) times a week.  05/21/13  Yes Historical Provider, MD  methocarbamol (ROBAXIN) 500 MG tablet Take 1 tablet by mouth 2 (two) times daily. 09/02/13  Yes Historical Provider, MD  methotrexate (RHEUMATREX) 2.5 MG tablet Take 10 mg by mouth 2 (two) times a week. *TAKE 8 TABLETS BY MOUTH FOR A TOTAL 20MG  WEEKLY Patient takes 4 tablets (10Mg ) on Fridays and 4 tablets (10mg ) on Saturdays   Yes Historical Provider, MD  mometasone (NASONEX) 50 MCG/ACT nasal spray  Place 2 sprays into the nose daily.   Yes Historical Provider, MD  Omega-3 Fatty Acids (FISH OIL) 1000 MG CAPS Take 2 capsules by mouth 2 (two) times daily.    Yes Historical Provider, MD  omeprazole (PRILOSEC) 20 MG capsule Take 20 mg by mouth 2 (two) times daily.    Yes Historical Provider, MD  oxyCODONE-acetaminophen (PERCOCET) 10-325 MG per tablet Take 1 tablet by mouth every 6 (six) hours as needed for pain. 05/25/12  Yes Verlee Monte, MD  potassium chloride SA (K-DUR,KLOR-CON) 20 MEQ tablet Take 20 mEq by mouth 2 (two) times daily.  07/02/13  Yes Historical Provider, MD  spironolactone (ALDACTONE) 25 MG tablet Take 25 mg by mouth daily. 08/21/13  Yes Historical Provider, MD  VOLTAREN 1 % GEL Apply 2 g topically daily as needed (pain).  05/21/13  Yes Historical Provider, MD  warfarin (COUMADIN) 2 MG tablet Take 6 mg by mouth See admin instructions. Takes 6 mg (3 tablets) daily except on Wednesdays doesn't take at all (MANAGED BY PMD)   Yes Historical Provider, MD  hydrOXYzine (ATARAX/VISTARIL) 25 MG tablet 1 at hs, or q6h prn itching. Patient not taking: Reported on 01/03/2015 10/29/14   Lenox Ahr, PA-C  naproxen (NAPROSYN) 500 MG tablet Take 1 tablet (500 mg total) by mouth 2 (two) times daily with a meal. Patient not taking: Reported on 01/03/2015 08/29/13   Johnna Acosta, MD  ondansetron (ZOFRAN ODT) 4 MG disintegrating tablet Take 1 tablet (4 mg total) by mouth every 8 (eight) hours as needed for nausea. Patient not taking: Reported on 01/03/2015 07/05/13   Janice Norrie, MD  predniSONE (DELTASONE) 10 MG tablet 5,4,3,2,1 - take with food Patient not taking: Reported on 01/03/2015 10/29/14   Lenox Ahr, PA-C   BP 117/93 mmHg  Pulse 80  Temp(Src) 98.4 F (36.9 C) (Oral)  Resp 18  Ht 5\' 8"  (1.727 m)  Wt 199 lb (90.266 kg)  BMI 30.26 kg/m2  SpO2 99% Physical Exam  Constitutional: He is oriented to person, place, and time. He appears well-developed.  HENT:  Head: Normocephalic.  Eyes:  Conjunctivae and EOM are normal. No scleral icterus.  Neck: Neck supple. No thyromegaly present.  Cardiovascular: Normal rate.  Exam reveals no gallop and no friction rub.   No murmur heard. Irregular rate  Pulmonary/Chest: No stridor. He has no wheezes. He has no rales. He exhibits no tenderness.  Abdominal: He exhibits no distension. There is no tenderness. There is no rebound.  Musculoskeletal: Normal range of motion.  Swelling and redness with tenderness to left lower leg  Lymphadenopathy:    He has no cervical adenopathy.  Neurological: He is oriented to person, place, and time. He exhibits normal muscle tone. Coordination normal.  Skin: No rash noted. No erythema.  Psychiatric: He has a normal mood and affect. His behavior is normal.    ED Course  Procedures (including critical care time) Labs Review Labs Reviewed  CBC WITH DIFFERENTIAL - Abnormal; Notable for the following:    RBC 3.31 (*)    Hemoglobin 11.7 (*)    HCT 35.6 (*)    MCV 107.6 (*)    MCH 35.3 (*)    RDW 16.8 (*)    Platelets 81 (*)    Monocytes Absolute 1.1 (*)    Eosinophils Relative 11 (*)    Eosinophils Absolute 1.1 (*)    All other components within normal limits  COMPREHENSIVE METABOLIC PANEL - Abnormal; Notable for the following:    CO2 33 (*)    Glucose, Bld 107 (*)    BUN 26 (*)    Creatinine, Ser 1.69 (*)    GFR calc non Af Amer 36 (*)    GFR calc Af Amer 42 (*)    All other components within normal limits  PROTIME-INR - Abnormal; Notable for the following:    Prothrombin Time 23.9 (*)    INR 2.12 (*)    All other components within normal limits  CULTURE, BLOOD (ROUTINE X 2)  CULTURE, BLOOD (ROUTINE X 2)  DIGOXIN LEVEL    Imaging Review No results found.   EKG Interpretation   Date/Time:  Saturday January 03 2015 20:13:50 EST Ventricular Rate:  76 PR Interval:    QRS Duration: 136 QT Interval:  420 QTC Calculation: 472 R Axis:   -  24 Text Interpretation:  Atrial fibrillation  RBBB and LAFB Confirmed by COOK   MD, BRIAN ([id number]) on 01/03/2015 8:49:31 PM      MDM   Final diagnoses:  Cellulitis of left lower leg    Cellulitis left lower leg    Maudry Diego, MD 01/03/15 2130

## 2015-01-04 DIAGNOSIS — I5032 Chronic diastolic (congestive) heart failure: Secondary | ICD-10-CM | POA: Diagnosis present

## 2015-01-04 DIAGNOSIS — D6859 Other primary thrombophilia: Secondary | ICD-10-CM | POA: Diagnosis present

## 2015-01-04 DIAGNOSIS — Z7952 Long term (current) use of systemic steroids: Secondary | ICD-10-CM | POA: Diagnosis not present

## 2015-01-04 DIAGNOSIS — I482 Chronic atrial fibrillation: Secondary | ICD-10-CM

## 2015-01-04 DIAGNOSIS — J45909 Unspecified asthma, uncomplicated: Secondary | ICD-10-CM | POA: Diagnosis present

## 2015-01-04 DIAGNOSIS — Z86711 Personal history of pulmonary embolism: Secondary | ICD-10-CM | POA: Diagnosis not present

## 2015-01-04 DIAGNOSIS — Z79899 Other long term (current) drug therapy: Secondary | ICD-10-CM | POA: Diagnosis not present

## 2015-01-04 DIAGNOSIS — Z79891 Long term (current) use of opiate analgesic: Secondary | ICD-10-CM | POA: Diagnosis not present

## 2015-01-04 DIAGNOSIS — M069 Rheumatoid arthritis, unspecified: Secondary | ICD-10-CM | POA: Diagnosis present

## 2015-01-04 DIAGNOSIS — I129 Hypertensive chronic kidney disease with stage 1 through stage 4 chronic kidney disease, or unspecified chronic kidney disease: Secondary | ICD-10-CM | POA: Diagnosis present

## 2015-01-04 DIAGNOSIS — N179 Acute kidney failure, unspecified: Secondary | ICD-10-CM | POA: Diagnosis present

## 2015-01-04 DIAGNOSIS — J449 Chronic obstructive pulmonary disease, unspecified: Secondary | ICD-10-CM | POA: Diagnosis present

## 2015-01-04 DIAGNOSIS — Z87891 Personal history of nicotine dependence: Secondary | ICD-10-CM | POA: Diagnosis not present

## 2015-01-04 DIAGNOSIS — M1990 Unspecified osteoarthritis, unspecified site: Secondary | ICD-10-CM | POA: Diagnosis present

## 2015-01-04 DIAGNOSIS — L03116 Cellulitis of left lower limb: Secondary | ICD-10-CM | POA: Diagnosis present

## 2015-01-04 DIAGNOSIS — G8929 Other chronic pain: Secondary | ICD-10-CM | POA: Diagnosis present

## 2015-01-04 DIAGNOSIS — Z7901 Long term (current) use of anticoagulants: Secondary | ICD-10-CM | POA: Diagnosis not present

## 2015-01-04 DIAGNOSIS — Z86718 Personal history of other venous thrombosis and embolism: Secondary | ICD-10-CM | POA: Diagnosis not present

## 2015-01-04 DIAGNOSIS — N183 Chronic kidney disease, stage 3 (moderate): Secondary | ICD-10-CM | POA: Diagnosis present

## 2015-01-04 DIAGNOSIS — M7989 Other specified soft tissue disorders: Secondary | ICD-10-CM | POA: Diagnosis present

## 2015-01-04 DIAGNOSIS — D539 Nutritional anemia, unspecified: Secondary | ICD-10-CM | POA: Diagnosis present

## 2015-01-04 DIAGNOSIS — K7460 Unspecified cirrhosis of liver: Secondary | ICD-10-CM | POA: Diagnosis present

## 2015-01-04 DIAGNOSIS — K219 Gastro-esophageal reflux disease without esophagitis: Secondary | ICD-10-CM | POA: Diagnosis present

## 2015-01-04 DIAGNOSIS — D696 Thrombocytopenia, unspecified: Secondary | ICD-10-CM | POA: Diagnosis present

## 2015-01-04 DIAGNOSIS — E782 Mixed hyperlipidemia: Secondary | ICD-10-CM | POA: Diagnosis present

## 2015-01-04 DIAGNOSIS — M47812 Spondylosis without myelopathy or radiculopathy, cervical region: Secondary | ICD-10-CM | POA: Diagnosis present

## 2015-01-04 DIAGNOSIS — R760 Raised antibody titer: Secondary | ICD-10-CM

## 2015-01-04 LAB — CBC
HEMATOCRIT: 32.7 % — AB (ref 39.0–52.0)
HEMOGLOBIN: 10.9 g/dL — AB (ref 13.0–17.0)
MCH: 35.7 pg — ABNORMAL HIGH (ref 26.0–34.0)
MCHC: 33.3 g/dL (ref 30.0–36.0)
MCV: 107.2 fL — ABNORMAL HIGH (ref 78.0–100.0)
Platelets: 74 10*3/uL — ABNORMAL LOW (ref 150–400)
RBC: 3.05 MIL/uL — AB (ref 4.22–5.81)
RDW: 16.8 % — ABNORMAL HIGH (ref 11.5–15.5)
WBC: 8.3 10*3/uL (ref 4.0–10.5)

## 2015-01-04 LAB — MRSA PCR SCREENING: MRSA BY PCR: POSITIVE — AB

## 2015-01-04 LAB — BASIC METABOLIC PANEL
ANION GAP: 8 (ref 5–15)
BUN: 25 mg/dL — AB (ref 6–23)
CO2: 32 mmol/L (ref 19–32)
Calcium: 8.8 mg/dL (ref 8.4–10.5)
Chloride: 99 mEq/L (ref 96–112)
Creatinine, Ser: 1.57 mg/dL — ABNORMAL HIGH (ref 0.50–1.35)
GFR calc Af Amer: 46 mL/min — ABNORMAL LOW (ref >90)
GFR, EST NON AFRICAN AMERICAN: 40 mL/min — AB (ref >90)
GLUCOSE: 120 mg/dL — AB (ref 70–99)
Potassium: 4 mmol/L (ref 3.5–5.1)
Sodium: 139 mmol/L (ref 135–145)

## 2015-01-04 LAB — PROTIME-INR
INR: 2.1 — ABNORMAL HIGH (ref 0.00–1.49)
PROTHROMBIN TIME: 23.7 s — AB (ref 11.6–15.2)

## 2015-01-04 LAB — GLUCOSE, CAPILLARY: Glucose-Capillary: 117 mg/dL — ABNORMAL HIGH (ref 70–99)

## 2015-01-04 MED ORDER — CLINDAMYCIN PHOSPHATE 600 MG/50ML IV SOLN
INTRAVENOUS | Status: AC
  Filled 2015-01-04: qty 50

## 2015-01-04 MED ORDER — MUPIROCIN 2 % EX OINT
1.0000 "application " | TOPICAL_OINTMENT | Freq: Two times a day (BID) | CUTANEOUS | Status: AC
  Administered 2015-01-04 – 2015-01-08 (×10): 1 via NASAL
  Filled 2015-01-04 (×2): qty 22

## 2015-01-04 MED ORDER — CHLORHEXIDINE GLUCONATE CLOTH 2 % EX PADS
6.0000 | MEDICATED_PAD | Freq: Every day | CUTANEOUS | Status: DC
  Administered 2015-01-04 – 2015-01-08 (×4): 6 via TOPICAL

## 2015-01-04 MED ORDER — FUROSEMIDE 40 MG PO TABS
40.0000 mg | ORAL_TABLET | Freq: Every day | ORAL | Status: DC
  Administered 2015-01-05 – 2015-01-06 (×2): 40 mg via ORAL
  Filled 2015-01-04 (×2): qty 1

## 2015-01-04 NOTE — Progress Notes (Signed)
[name] QIW:[id number] DOB: 10/01/1931 DOA: 01/03/2015 PCP: [name], MD  Brief narrative: 81 y/o ? Diastolic HF, LVEF 41% 7/40 +AoS + ? CVP, Chronic Afib  08/2010 CHad2Vasc2 score~4 on Coumadin ?Cervicogenic Ha's foll Neuro as OP, COPD, Lap chole 5/13, Rh Arthritis + Antiphospholipid c +Lupus AC-prior DVT 09/2002 admitted 01/03/14 [had several blood clots]-# yr ago had a blood clot  Past medical history-As per Problem list Chart reviewed as below-  Consultants:  none  Procedures:  none  Antibiotics:  Vancomycin 1/9  Clindamycin 1/9   Subjective  3 day h/o LLE swelling Painful ~ 2 months ago had Lateral malleolar pain as well as medial malleolar pain Didn't see anyone for either Pain not really any better today   Objective    Interim History:   Telemetry: afib   Objective: Filed Vitals:   01/03/15 2245 01/03/15 2322 01/04/15 0637 01/04/15 0932  BP: 109/66 128/59 101/50 104/44  Pulse: 87 85 76 73  Temp:  98.3 F (36.8 C) 98 F (36.7 C) 98.7 F (37.1 C)  TempSrc:  Oral Oral Oral  Resp: 15 18 18 18   Height:  5\' 8"  (1.727 m)    Weight:  86 kg (189 lb 9.5 oz)    SpO2: 96% 92% 96% 97%   No intake or output data in the 24 hours ending 01/04/15 1014  Exam:  General: EOMI, NCAT Cardiovascular: s1 s 2no m/r/g Respiratory: clear no added sound Abdomen: soft, Nt/ND no rebound/gaurd Skin Painful and wamr to touch LLE Neuro intact  Data Reviewed: Basic Metabolic Panel:  Recent Labs Lab 01/03/15 2005 01/04/15 0555  NA 136 139  K 3.7 4.0  CL 96 99  CO2 33* 32  GLUCOSE 107* 120*  BUN 26* 25*  CREATININE 1.69* 1.57*  CALCIUM 9.0 8.8   Liver Function Tests:  Recent Labs Lab 01/03/15 2005  AST 24  ALT 30  ALKPHOS 57  BILITOT 1.1  PROT 7.7  ALBUMIN 4.1   No results for input(s): LIPASE, AMYLASE in the last 168 hours. No results for input(s): AMMONIA in the last 168 hours. CBC:  Recent Labs Lab 01/03/15 2005 01/04/15 0555   WBC 9.9 8.3  NEUTROABS 5.9  --   HGB 11.7* 10.9*  HCT 35.6* 32.7*  MCV 107.6* 107.2*  PLT 81* 74*   Cardiac Enzymes: No results for input(s): CKTOTAL, CKMB, CKMBINDEX, TROPONINI in the last 168 hours. BNP: Invalid input(s): POCBNP CBG: No results for input(s): GLUCAP in the last 168 hours.  Recent Results (from the past 240 hour(s))  Blood culture (routine x 2)     Status: None (Preliminary result)   Collection Time: 01/03/15  9:27 PM  Result Value Ref Range Status   Specimen Description BLOOD LEFT ANTECUBITAL  Final   Special Requests BOTTLES DRAWN AEROBIC AND ANAEROBIC 8CC  Final   Culture PENDING  Incomplete   Report Status PENDING  Incomplete  Blood culture (routine x 2)     Status: None (Preliminary result)   Collection Time: 01/03/15  9:35 PM  Result Value Ref Range Status   Specimen Description BLOOD LEFT ANTECUBITAL  Final   Special Requests BOTTLES DRAWN AEROBIC AND ANAEROBIC 8CC  Final   Culture PENDING  Incomplete   Report Status PENDING  Incomplete  MRSA PCR Screening     Status: Abnormal   Collection Time: 01/03/15 11:45 PM  Result Value Ref Range Status   MRSA by PCR POSITIVE (A) NEGATIVE Final    Comment:  The GeneXpert MRSA Assay (FDA approved for NASAL specimens only), is one component of a comprehensive MRSA colonization surveillance program. It is not intended to diagnose MRSA infection nor to guide or monitor treatment for MRSA infections. RESULT CALLED TO, READ BACK BY AND VERIFIED WITH: RESULT CALLED TO, READ BACK BY AND VERIFIED WITH:  FOOTE,L @ 0330 ON 01/04/15 BY WOODIE,J      Studies:              All Imaging reviewed and is as per above notation   Scheduled Meds: . Chlorhexidine Gluconate Cloth  6 each Topical Q0600  . clindamycin (CLEOCIN) IV  600 mg Intravenous Q8H  . cloNIDine  0.1 mg Oral Daily  . digoxin  250 mcg Oral q morning - 10a  . diltiazem  300 mg Oral QPM  . furosemide  40 mg Oral BID  . gabapentin  300 mg Oral  Daily  . gabapentin  600 mg Oral QHS  . HYDROcodone-acetaminophen  1 tablet Oral BID  . methocarbamol  500 mg Oral BID  . [START ON 01/05/2015] methotrexate  10 mg Oral Once per day on Mon Thu  . mupirocin ointment  1 application Nasal BID  . pantoprazole  40 mg Oral Daily  . potassium chloride SA  20 mEq Oral BID  . spironolactone  25 mg Oral Daily  . warfarin  6 mg Oral Once per day on Sun Mon Tue Thu Fri Sat  . Warfarin - Physician Dosing Inpatient   Does not apply q1800   Continuous Infusions:    Assessment/Plan: 1. LLE cellulitis-Although High risk for DVT, pt therapeutic on reg coumadin-INR 2.1-Rx as cellulitis with vancomycin/clindamycin-narrowed to clindamycin in a.m. by mouth if clinically appears better 2. Chronic atrial fibrillation, [place] score 4-therapeutic on Coumadin. Continue digoxin 250 every morning, Cardizem 300 every afternoon. 3. Chronic diastolic heart failure without acute component-see below, Aldactone 25 mg daily 4. Rheumatoid arthritis with lupus anticoagulant positive status post multiple DVTs-change of anticonstipation would not affect current management. Consider IVC filter if another DVT occurs.  Continue methotrexate 10 mg Monday/Thursday 5. Acute kidney injury-usually GFR 69 today 40-change regular home Lasix to once daily by mouth 40 mg, repeat labs a.m. 6. Chronic pain secondary to rheumatism, cervical headaches-continue Robaxin 500 twice a day, continue gabapentin 600 daily at bedtime, 300 daily  Code Status: Full Family Communication: No family at bedside Disposition Plan: Inpatient telemetry, expected date of discharge 1/12   Verneita Griffes, MD  [facility] Pager [phone] 01/04/2015, 10:14 AM    LOS: 1 day

## 2015-01-04 NOTE — Progress Notes (Signed)
UR completed 

## 2015-01-05 ENCOUNTER — Encounter (HOSPITAL_COMMUNITY): Payer: Self-pay | Admitting: Internal Medicine

## 2015-01-05 DIAGNOSIS — N183 Chronic kidney disease, stage 3 unspecified: Secondary | ICD-10-CM | POA: Diagnosis present

## 2015-01-05 DIAGNOSIS — D696 Thrombocytopenia, unspecified: Secondary | ICD-10-CM | POA: Diagnosis present

## 2015-01-05 DIAGNOSIS — N179 Acute kidney failure, unspecified: Secondary | ICD-10-CM | POA: Diagnosis present

## 2015-01-05 LAB — PROTIME-INR
INR: 1.66 — ABNORMAL HIGH (ref 0.00–1.49)
PROTHROMBIN TIME: 19.8 s — AB (ref 11.6–15.2)

## 2015-01-05 LAB — CBC
HEMATOCRIT: 36.5 % — AB (ref 39.0–52.0)
HEMOGLOBIN: 12.1 g/dL — AB (ref 13.0–17.0)
MCH: 35.5 pg — AB (ref 26.0–34.0)
MCHC: 33.2 g/dL (ref 30.0–36.0)
MCV: 107 fL — ABNORMAL HIGH (ref 78.0–100.0)
Platelets: 124 10*3/uL — ABNORMAL LOW (ref 150–400)
RBC: 3.41 MIL/uL — ABNORMAL LOW (ref 4.22–5.81)
RDW: 16.7 % — AB (ref 11.5–15.5)
WBC: 9.2 10*3/uL (ref 4.0–10.5)

## 2015-01-05 LAB — COMPREHENSIVE METABOLIC PANEL
ALT: 26 U/L (ref 0–53)
ANION GAP: 9 (ref 5–15)
AST: 27 U/L (ref 0–37)
Albumin: 3.8 g/dL (ref 3.5–5.2)
Alkaline Phosphatase: 54 U/L (ref 39–117)
BILIRUBIN TOTAL: 1.5 mg/dL — AB (ref 0.3–1.2)
BUN: 25 mg/dL — ABNORMAL HIGH (ref 6–23)
CHLORIDE: 98 meq/L (ref 96–112)
CO2: 32 mmol/L (ref 19–32)
Calcium: 9.1 mg/dL (ref 8.4–10.5)
Creatinine, Ser: 1.65 mg/dL — ABNORMAL HIGH (ref 0.50–1.35)
GFR calc non Af Amer: 37 mL/min — ABNORMAL LOW (ref >90)
GFR, EST AFRICAN AMERICAN: 43 mL/min — AB (ref >90)
Glucose, Bld: 117 mg/dL — ABNORMAL HIGH (ref 70–99)
POTASSIUM: 4.1 mmol/L (ref 3.5–5.1)
Sodium: 139 mmol/L (ref 135–145)
Total Protein: 7.4 g/dL (ref 6.0–8.3)

## 2015-01-05 LAB — TSH: TSH: 1.335 u[IU]/mL (ref 0.350–4.500)

## 2015-01-05 LAB — VITAMIN B12: VITAMIN B 12: 632 pg/mL (ref 211–911)

## 2015-01-05 MED ORDER — WARFARIN SODIUM 5 MG PO TABS
10.0000 mg | ORAL_TABLET | Freq: Once | ORAL | Status: AC
  Administered 2015-01-05: 10 mg via ORAL
  Filled 2015-01-05: qty 2

## 2015-01-05 MED ORDER — VANCOMYCIN HCL 10 G IV SOLR
1500.0000 mg | INTRAVENOUS | Status: DC
  Administered 2015-01-05: 1500 mg via INTRAVENOUS
  Filled 2015-01-05 (×3): qty 1500

## 2015-01-05 MED ORDER — WARFARIN - PHARMACIST DOSING INPATIENT
Status: DC
  Administered 2015-01-06: 16:00:00

## 2015-01-05 MED ORDER — ENOXAPARIN SODIUM 80 MG/0.8ML ~~LOC~~ SOLN
80.0000 mg | Freq: Two times a day (BID) | SUBCUTANEOUS | Status: DC
  Administered 2015-01-05 – 2015-01-06 (×4): 80 mg via SUBCUTANEOUS
  Filled 2015-01-05 (×4): qty 0.8

## 2015-01-05 MED ORDER — SODIUM CHLORIDE 0.9 % IV SOLN
INTRAVENOUS | Status: DC
  Administered 2015-01-05 – 2015-01-07 (×4): via INTRAVENOUS

## 2015-01-05 NOTE — Progress Notes (Signed)
ANTIBIOTIC CONSULT NOTE  Pharmacy Consult for Vancomycin Indication: cellulitis  Allergies  Allergen Reactions  . Cymbalta [Duloxetine Hcl] Other (See Comments)    Confusion   . Procaine Hcl Hives and Other (See Comments)    NOVOCAINE: Sweating, Confusion, Not in right state of mind.  [name]    unknown    Patient Measurements: Height: 5\' 8"  (172.7 cm) Weight: 189 lb 9.5 oz (86 kg) IBW/kg (Calculated) : 68.4  Vital Signs: Temp: 98.8 F (37.1 C) (01/11 0606) Temp Source: Oral (01/11 0606) BP: 102/48 mmHg (01/11 0606) Pulse Rate: 74 (01/11 1014) Intake/Output from previous day: 01/10 0701 - 01/11 0700 In: 650 [P.O.:600; IV Piggyback:50] Out: 1700 [Urine:1700] Intake/Output from this shift: Total I/O In: 240 [P.O.:240] Out: -   Labs:  Recent Labs  01/03/15 2005 01/04/15 0555 01/05/15 0550  WBC 9.9 8.3 9.2  HGB 11.7* 10.9* 12.1*  PLT 81* 74* 124*  CREATININE 1.69* 1.57* 1.65*   Estimated Creatinine Clearance: 37.4 mL/min (by C-G formula based on Cr of 1.65). No results for input(s): VANCOTROUGH, VANCOPEAK, VANCORANDOM, GENTTROUGH, GENTPEAK, GENTRANDOM, TOBRATROUGH, TOBRAPEAK, TOBRARND, AMIKACINPEAK, AMIKACINTROU, AMIKACIN in the last 72 hours.   Microbiology: Recent Results (from the past 720 hour(s))  Blood culture (routine x 2)     Status: None (Preliminary result)   Collection Time: 01/03/15  9:27 PM  Result Value Ref Range Status   Specimen Description BLOOD LEFT ANTECUBITAL  Final   Special Requests BOTTLES DRAWN AEROBIC AND ANAEROBIC 8CC  Final   Culture NO GROWTH 2 DAYS  Final   Report Status PENDING  Incomplete  Blood culture (routine x 2)     Status: None (Preliminary result)   Collection Time: 01/03/15  9:35 PM  Result Value Ref Range Status   Specimen Description BLOOD LEFT ANTECUBITAL  Final   Special Requests BOTTLES DRAWN AEROBIC AND ANAEROBIC 8CC  Final   Culture NO GROWTH 2 DAYS  Final   Report Status  PENDING  Incomplete  MRSA PCR Screening     Status: Abnormal   Collection Time: 01/03/15 11:45 PM  Result Value Ref Range Status   MRSA by PCR POSITIVE (A) NEGATIVE Final    Comment:        The GeneXpert MRSA Assay (FDA approved for NASAL specimens only), is one component of a comprehensive MRSA colonization surveillance program. It is not intended to diagnose MRSA infection nor to guide or monitor treatment for MRSA infections. RESULT CALLED TO, READ BACK BY AND VERIFIED WITH: RESULT CALLED TO, READ BACK BY AND VERIFIED WITH:  FOOTE,L @ 0330 ON 01/04/15 BY WOODIE,J     Anti-infectives    Start     Dose/Rate Route Frequency Ordered Stop   01/05/15 1000  vancomycin (VANCOCIN) 1,500 mg in sodium chloride 0.9 % 500 mL IVPB     1,500 mg250 mL/hr over 120 Minutes Intravenous Every 24 hours 01/05/15 0913     01/04/15 0000  clindamycin (CLEOCIN) IVPB 600 mg  Status:  Discontinued     600 mg100 mL/hr over 30 Minutes Intravenous Every 8 hours 01/03/15 2321 01/05/15 0908   01/03/15 2115  vancomycin (VANCOCIN) IVPB 1000 mg/200 mL premix     1,000 mg200 mL/hr over 60 Minutes Intravenous  Once 01/03/15 2109 01/03/15 2256      Assessment: 81 yo M admitted with cellulitis & started on Clindamycin per cellulitis protocol.  Minimum improvement at this point & MD would like to change antibiotic to Vancomycin. He  is afebrile with normal WBC.  Noted he continues to have erythema & pain in LLE.  Clindamycin 1/9>>1/11 Vancomycin 1/11>>  Goal of Therapy:  Vancomycin trough level 10-15 mcg/ml  Plan:  Vancomycin 1500mg  IV q24h Check Vancomycin trough at steady state Monitor renal function and cx data   Biagio Borg 01/05/2015,11:41 AM

## 2015-01-05 NOTE — Progress Notes (Signed)
[facility] PROGRESS NOTE  [name] RWE:[id number] DOB: 1931/05/12 DOA: 01/03/2015 PCP: [name], MD    Code Status: Full code Family Communication: Discussed with patient; family not available Disposition Plan: Discharge when clinically appropriate   Consultants:  None  Procedures:  None  Antibiotics:  Vancomycin 1/9-1/10-restarted on 01/05/15>  Clindamycin 1/9-1/112016.  HPI/Subjective: Patient says that the redness in his left leg has decreased a little. He still has some swelling in his ankle and is painful to walk. No other complaints.  Objective: Filed Vitals:   01/05/15 1014  BP:   Pulse: 74  Temp:   Resp:    temperature 98.8. Pulse 74. Respiratory rate 20. Blood pressure 102/48. Oxygen saturation 91%.  Intake/Output Summary (Last 24 hours) at 01/05/15 1056 Last data filed at 01/05/15 0900  Gross per 24 hour  Intake    770 ml  Output   1700 ml  Net   -930 ml   Filed Weights   01/03/15 1501 01/03/15 2322  Weight: 90.266 kg (199 lb) 86 kg (189 lb 9.5 oz)    Exam:   General:  Elderly 81 year old man laying in bed, in no acute distress.  Cardiovascular: Irregular, irregular, with 2/6 systolic murmur.  Respiratory: Breathing is nonlabored, but occasional wheezes auscultated in the bases.  Abdomen: Positive bowel sounds, soft, nontender, nondistended.  Musculoskeletal/extremities: There is some peri-ankle swelling on the left and exquisite tenderness over the pretibial area of the left leg; erythema noted below the knee to the ankle on the left with some resolution proximally and distally. Right leg without edema or abnormalities.  Neurologic: He is alert and oriented 3. Cranial nerves II through XII are grossly intact.  Data Reviewed: Basic Metabolic Panel:  Recent Labs Lab 01/03/15 2005 01/04/15 0555 01/05/15 0550  NA 136 139 139  K 3.7 4.0 4.1  CL 96 99 98  CO2 33* 32 32  GLUCOSE 107* 120* 117*  BUN 26* 25* 25*   CREATININE 1.69* 1.57* 1.65*  CALCIUM 9.0 8.8 9.1   Liver Function Tests:  Recent Labs Lab 01/03/15 2005 01/05/15 0550  AST 24 27  ALT 30 26  ALKPHOS 57 54  BILITOT 1.1 1.5*  PROT 7.7 7.4  ALBUMIN 4.1 3.8   No results for input(s): LIPASE, AMYLASE in the last 168 hours. No results for input(s): AMMONIA in the last 168 hours. CBC:  Recent Labs Lab 01/03/15 2005 01/04/15 0555 01/05/15 0550  WBC 9.9 8.3 9.2  NEUTROABS 5.9  --   --   HGB 11.7* 10.9* 12.1*  HCT 35.6* 32.7* 36.5*  MCV 107.6* 107.2* 107.0*  PLT 81* 74* 124*   Cardiac Enzymes: No results for input(s): CKTOTAL, CKMB, CKMBINDEX, TROPONINI in the last 168 hours. BNP (last 3 results) No results for input(s): PROBNP in the last 8760 hours. CBG:  Recent Labs Lab 01/04/15 1649  GLUCAP 117*    Recent Results (from the past 240 hour(s))  Blood culture (routine x 2)     Status: None (Preliminary result)   Collection Time: 01/03/15  9:27 PM  Result Value Ref Range Status   Specimen Description BLOOD LEFT ANTECUBITAL  Final   Special Requests BOTTLES DRAWN AEROBIC AND ANAEROBIC 8CC  Final   Culture NO GROWTH 2 DAYS  Final   Report Status PENDING  Incomplete  Blood culture (routine x 2)     Status: None (Preliminary result)   Collection Time: 01/03/15  9:35 PM  Result Value Ref Range Status   Specimen Description  BLOOD LEFT ANTECUBITAL  Final   Special Requests BOTTLES DRAWN AEROBIC AND ANAEROBIC 8CC  Final   Culture NO GROWTH 2 DAYS  Final   Report Status PENDING  Incomplete  MRSA PCR Screening     Status: Abnormal   Collection Time: 01/03/15 11:45 PM  Result Value Ref Range Status   MRSA by PCR POSITIVE (A) NEGATIVE Final    Comment:        The GeneXpert MRSA Assay (FDA approved for NASAL specimens only), is one component of a comprehensive MRSA colonization surveillance program. It is not intended to diagnose MRSA infection nor to guide or monitor treatment for MRSA infections. RESULT CALLED  TO, READ BACK BY AND VERIFIED WITH: RESULT CALLED TO, READ BACK BY AND VERIFIED WITH:  FOOTE,L @ 0330 ON 01/04/15 BY WOODIE,J      Studies: No results found.  Scheduled Meds: . Chlorhexidine Gluconate Cloth  6 each Topical Q0600  . cloNIDine  0.1 mg Oral Daily  . digoxin  250 mcg Oral q morning - 10a  . diltiazem  300 mg Oral QPM  . enoxaparin (LOVENOX) injection  80 mg Subcutaneous Q12H  . furosemide  40 mg Oral Daily  . gabapentin  300 mg Oral Daily  . gabapentin  600 mg Oral QHS  . HYDROcodone-acetaminophen  1 tablet Oral BID  . methocarbamol  500 mg Oral BID  . methotrexate  10 mg Oral Once per day on Mon Thu  . mupirocin ointment  1 application Nasal BID  . pantoprazole  40 mg Oral Daily  . potassium chloride SA  20 mEq Oral BID  . spironolactone  25 mg Oral Daily  . vancomycin  1,500 mg Intravenous Q24H  . warfarin  10 mg Oral Once  . [START ON 01/06/2015] Warfarin - Pharmacist Dosing Inpatient   Does not apply Q24H   Continuous Infusions: . sodium chloride     Assessment and plan: Principal Problem:   Cellulitis of leg, left Active Problems:   Chronic atrial fibrillation   Acute kidney injury   Stage III chronic kidney disease   Essential hypertension, benign   Rheumatoid arthritis   Lupus anticoagulant positive   COPD (chronic obstructive pulmonary disease)   Long term current use of anticoagulant   Chronic diastolic heart failure   Cervical spondylosis without myelopathy   Thrombocytopenia   1. Left lower extremity cellulitis. There appears to be some improvement, but there is still quite a bit of erythema, mild edema, and moderate tenderness. Will discontinue clindamycin in favor of IV vancomycin. The patient could likely be transitioned to oral clindamycin when he is ready for discharge. We'll hold off on ordering lower extremity venous Doppler of his leg as his INR was therapeutic on admission.  Chronic atrial fibrillation. We'll continue Coumadin, but  I have asked the pharmacist to dose it. We'll also start Lovenox per pharmacy as a bridge because his INR has fallen below 2.0. His rate is controlled. Will continue digoxin and diltiazem.  History of DVT/PE/history of lupus anticoagulant. Coumadin and Lovenox as above. Will discontinue Lovenox when his INR is between 2 and 3.  Hypertension. Currently stable and controlled. Per outpatient note by his cardiologist Dr. Domenic Polite, the patient's blood pressure runs on the lower end of normal. We'll continue titrating clonidine down and then off as previously started in the outpatient setting. Apparently his last dose will be in 2-3 more days.  Chronic diastolic heart failure. Appears compensated. Will continue Lasix and Aldactone. Consider  decreasing doses due to acute kidney injury.  Acute kidney injury superimposed on stage III chronic kidney disease. Obtain records from the patient's PCP and it appears that his baseline creatinine is 1.4. It was 1.69 on admission. Lasix was decreased from twice a day to daily. We'll consider decreasing Aldactone as well. Will start gentle IV fluids and continue to follow his renal function. The patient was scheduled to follow-up with nephrology in the outpatient setting by his PCP.  COPD. Currently stable. Continue when necessary albuterol nebulizer.  Rheumatoid arthritis. Continue analgesics and methotrexate as ordered.  Macrocytic anemia and thrombocytopenia. This is most likely secondary to methotrexate, but will order a TSH and vitamin B12 level for further evaluation.    Time spent: 35 minutes    [facility] Pager [phone]. If 7PM-7AM, please contact night-coverage at [email or web address], password [facility] 01/05/2015, 10:56 AM  LOS: 2 days

## 2015-01-05 NOTE — Care Management Note (Addendum)
    Page 1 of 2   01/08/2015     12:01:27 PM CARE MANAGEMENT NOTE 01/08/2015  Patient:  [name], [name]   Account Number:  [id number]  Date Initiated:  01/05/2015  Documentation initiated by:  Jolene Provost  Subjective/Objective Assessment:   Pt admitted for cellulitis. Pt is from home with self care. Pt has home Oxygen through [place] but says he no longer needs it and has been trying to get them to pick it up but they wont and they keep charging him.     Action/Plan:   Pt uses a cane but is independent with ADL's and has no HH services. Pt plans to discharge home with self care. [facility] contacted and says pt qualifies for HS O2 but is not paying bill. Many with [employer] will discuss with her manager.   Anticipated DC Date:  01/08/2015   Anticipated DC Plan:  [place]  CM consult      PAC Choice  DURABLE MEDICAL EQUIPMENT   Choice offered to / List presented to:  C-1 Patient   DME arranged  [place]      DME agency  [place].        Status of service:  Completed, signed off Medicare Important Message given?  YES (If response is "NO", the following Medicare IM given date fields will be blank) Date Medicare IM given:  01/08/2015 Medicare IM given by:  Jolene Provost Date Additional Medicare IM given:   Additional Medicare IM given by:    Discharge Disposition:  HOME/SELF CARE  Per UR Regulation:    If discussed at Long Length of Stay Meetings, dates discussed:    Comments:  01/08/2015 [place], RN, MSN, PCCN Pt is discharging home with self care today. Pt's shower chair to be delivered to pt's home. No further CM needs at this time.  01/07/2015 [place], RN, MSN, PCCN Per PT's recommendation, shower chair has been ordered. Emma, of [facility], per pt's choice, has been notified and will have DME deleivered to pt's home. Terrence Dupont will obtain pt's information from chart. Will continue to follow for CM  needs.  01/05/2015 [place], RN, MSN, [facility]

## 2015-01-05 NOTE — Progress Notes (Signed)
Patient ambulated the full length of the hallway, pass the nurses station and back to his room. Patient used cane as his aid and O2 sats maintained at 94% room air during ambulation. O2 sats were 96% room air at rest in room. Patient tolerated walk well and liked being up out of bed. Will continue to monitor.

## 2015-01-05 NOTE — Progress Notes (Signed)
[facility] for Lovenox-->Coumadin Indication: atrial fibrillation, Hx VTE  Allergies  Allergen Reactions  . Cymbalta [Duloxetine Hcl] Other (See Comments)    Confusion   . Procaine Hcl Hives and Other (See Comments)    NOVOCAINE: Sweating, Confusion, Not in right state of mind.  [name]    unknown    Patient Measurements: Height: 5\' 8"  (172.7 cm) Weight: 189 lb 9.5 oz (86 kg) IBW/kg (Calculated) : 68.4  Vital Signs: Temp: 98.8 F (37.1 C) (01/11 0606) Temp Source: Oral (01/11 0606) BP: 102/48 mmHg (01/11 0606) Pulse Rate: 68 (01/11 0606)  Labs:  Recent Labs  01/03/15 2005 01/04/15 0555 01/05/15 0550  HGB 11.7* 10.9* 12.1*  HCT 35.6* 32.7* 36.5*  PLT 81* 74* 124*  LABPROT 23.9* 23.7* 19.8*  INR 2.12* 2.10* 1.66*  CREATININE 1.69* 1.57* 1.65*    Estimated Creatinine Clearance: 37.4 mL/min (by C-G formula based on Cr of 1.65).   Medical History: Past Medical History  Diagnosis Date  . COPD (chronic obstructive pulmonary disease)     Uses occasional nighttime O2  . Disseminated herpes zoster 2010  . GERD (gastroesophageal reflux disease)   . Asthma   . Pulmonary embolus     2003 and 2011  . Pulmonary nodules     Chest CT 09/11  . Mixed hyperlipidemia   . Atrial fibrillation     CHADS2 score 2  . Lupus anticoagulant positive   . Essential hypertension, benign   . Cholelithiasis   . Rheumatoid arthritis(714.0)   . History of chicken pox 1941; 2011  . Anemia   . Chronic lower back pain   . Chronic diastolic heart failure   . Cirrhosis     Questionable, AFP normal Feb 01/2012, hx of ETOH use   . Headache(784.0) 10/21/2013  . Cervical spondylosis without myelopathy 10/21/2013    Medications:  Medications Prior to Admission  Medication Sig Dispense Refill  . albuterol (PROAIR HFA) 108 (90 BASE) MCG/ACT inhaler Inhale 2 puffs into the lungs every 6 (six) hours as needed.  Shortness of Breath    . albuterol (PROVENTIL) (2.5 MG/3ML) 0.083% nebulizer solution Take 2.5 mg by nebulization every 6 (six) hours as needed. Shortness of Breath    . cetirizine (ZYRTEC) 10 MG tablet Take 10 mg by mouth daily.      . cloNIDine (CATAPRES) 0.1 MG tablet Take 1 tablet (0.1 mg total) by mouth daily. For 1 week; then, STOP CLONIDINE 7 tablet 0  . clotrimazole-betamethasone (LOTRISONE) cream Apply 1 application topically daily.     . digoxin (DIGOX) 0.25 MG tablet Take 1 tablet (250 mcg total) by mouth daily. (Patient taking differently: Take 250 mcg by mouth every morning. ) 30 tablet 11  . diltiazem (CARDIZEM CD) 300 MG 24 hr capsule Take 1 capsule (300 mg total) by mouth daily. (Patient taking differently: Take 300 mg by mouth every evening. ) 30 capsule 11  . Etanercept (ENBREL) 25 MG/0.5ML SOSY Inject into the skin 2 (two) times a week. Twice a week.    . fluticasone (FLONASE) 50 MCG/ACT nasal spray Place 2 sprays into the nose as needed for allergies.     . folic acid (FOLVITE) 1 MG tablet Take 1 mg by mouth daily.     . furosemide (LASIX) 40 MG tablet Take 40 mg by mouth 2 (two) times daily. *Sometimes takes two tablets in the morning and one tablet in the evening as regimen depending on  fluid retention    . gabapentin (NEURONTIN) 300 MG capsule TAKE ONE CAPSULE BY MOUTH EVERY MORNING AND TWO CAPSULES EVERY EVENING 90 capsule 3  . HYDROcodone-acetaminophen (NORCO) 10-325 MG per tablet Take 1 tablet by mouth 2 (two) times daily.    Marland Kitchen ketoconazole (NIZORAL) 2 % shampoo Apply 1 application topically 3 (three) times a week.     . methocarbamol (ROBAXIN) 500 MG tablet Take 1 tablet by mouth 2 (two) times daily.    . methotrexate (RHEUMATREX) 2.5 MG tablet Take 10 mg by mouth 2 (two) times a week. *TAKE 8 TABLETS BY MOUTH FOR A TOTAL 20MG  WEEKLY Patient takes 4 tablets (10Mg ) on Fridays and 4 tablets (10mg ) on Saturdays    . mometasone (NASONEX) 50 MCG/ACT nasal spray Place 2 sprays  into the nose daily.    . Omega-3 Fatty Acids (FISH OIL) 1000 MG CAPS Take 2 capsules by mouth 2 (two) times daily.     Marland Kitchen omeprazole (PRILOSEC) 20 MG capsule Take 20 mg by mouth 2 (two) times daily.     Marland Kitchen oxyCODONE-acetaminophen (PERCOCET) 10-325 MG per tablet Take 1 tablet by mouth every 6 (six) hours as needed for pain.    . potassium chloride SA (K-DUR,KLOR-CON) 20 MEQ tablet Take 20 mEq by mouth 2 (two) times daily.     Marland Kitchen spironolactone (ALDACTONE) 25 MG tablet Take 25 mg by mouth daily.    . VOLTAREN 1 % GEL Apply 2 g topically daily as needed (pain).     Marland Kitchen warfarin (COUMADIN) 2 MG tablet Take 6 mg by mouth See admin instructions. Takes 6 mg (3 tablets) daily except on Wednesdays doesn't take at all (MANAGED BY PMD)    . hydrOXYzine (ATARAX/VISTARIL) 25 MG tablet 1 at hs, or q6h prn itching. (Patient not taking: Reported on 01/03/2015) 12 tablet 0  . naproxen (NAPROSYN) 500 MG tablet Take 1 tablet (500 mg total) by mouth 2 (two) times daily with a meal. (Patient not taking: Reported on 01/03/2015) 30 tablet 0  . ondansetron (ZOFRAN ODT) 4 MG disintegrating tablet Take 1 tablet (4 mg total) by mouth every 8 (eight) hours as needed for nausea. (Patient not taking: Reported on 01/03/2015) 12 tablet 0  . predniSONE (DELTASONE) 10 MG tablet 5,4,3,2,1 - take with food (Patient not taking: Reported on 01/03/2015) 15 tablet 0     Assessment: 81 yo M on chronic Coumadin for Afib and +Lupus AC with history of multiple PEs.  Home dose listed above.  INR was therapeutic on admission, but has trended below goal range on home dose.   Pharmacy was asked to assume dosing of warfarin today and add Lovenox to bridge until INR back in therapeutic range given patient's high risk for developing blood clot.   CBC reviewed.  Platelet count has been <100K this admission, but is improving.  No bleeding noted.  Acute kidney injury also noted.  Estimated CrCl >48ml/min.    Goal of Therapy:  INR 2-3 Monitor platelets by  anticoagulation protocol: Yes   Plan:  Coumadin 10mg  po x1 today Daily INR Overlap with Lovenox 1mg /kg q12h until INR>2 Monitor CBC & renal function  Biagio Borg 01/05/2015,7:57 AM

## 2015-01-06 DIAGNOSIS — N183 Chronic kidney disease, stage 3 (moderate): Secondary | ICD-10-CM

## 2015-01-06 LAB — URINALYSIS, ROUTINE W REFLEX MICROSCOPIC
BILIRUBIN URINE: NEGATIVE
Glucose, UA: NEGATIVE mg/dL
KETONES UR: NEGATIVE mg/dL
Leukocytes, UA: NEGATIVE
NITRITE: NEGATIVE
PROTEIN: NEGATIVE mg/dL
Specific Gravity, Urine: 1.01 (ref 1.005–1.030)
UROBILINOGEN UA: 0.2 mg/dL (ref 0.0–1.0)
pH: 6 (ref 5.0–8.0)

## 2015-01-06 LAB — CBC
HCT: 33 % — ABNORMAL LOW (ref 39.0–52.0)
Hemoglobin: 11.1 g/dL — ABNORMAL LOW (ref 13.0–17.0)
MCH: 35.9 pg — AB (ref 26.0–34.0)
MCHC: 33.6 g/dL (ref 30.0–36.0)
MCV: 106.8 fL — AB (ref 78.0–100.0)
Platelets: 103 10*3/uL — ABNORMAL LOW (ref 150–400)
RBC: 3.09 MIL/uL — ABNORMAL LOW (ref 4.22–5.81)
RDW: 16.7 % — AB (ref 11.5–15.5)
WBC: 6.1 10*3/uL (ref 4.0–10.5)

## 2015-01-06 LAB — BASIC METABOLIC PANEL
Anion gap: 8 (ref 5–15)
BUN: 31 mg/dL — ABNORMAL HIGH (ref 6–23)
CO2: 30 mmol/L (ref 19–32)
Calcium: 8.5 mg/dL (ref 8.4–10.5)
Chloride: 101 mEq/L (ref 96–112)
Creatinine, Ser: 1.78 mg/dL — ABNORMAL HIGH (ref 0.50–1.35)
GFR calc Af Amer: 39 mL/min — ABNORMAL LOW (ref >90)
GFR calc non Af Amer: 34 mL/min — ABNORMAL LOW (ref >90)
GLUCOSE: 94 mg/dL (ref 70–99)
Potassium: 4.3 mmol/L (ref 3.5–5.1)
SODIUM: 139 mmol/L (ref 135–145)

## 2015-01-06 LAB — URINE MICROSCOPIC-ADD ON

## 2015-01-06 LAB — PROTIME-INR
INR: 1.96 — ABNORMAL HIGH (ref 0.00–1.49)
PROTHROMBIN TIME: 22.5 s — AB (ref 11.6–15.2)

## 2015-01-06 LAB — T4, FREE: FREE T4: 1.06 ng/dL (ref 0.80–1.80)

## 2015-01-06 MED ORDER — WARFARIN SODIUM 5 MG PO TABS
10.0000 mg | ORAL_TABLET | Freq: Once | ORAL | Status: AC
  Administered 2015-01-06: 10 mg via ORAL
  Filled 2015-01-06: qty 2

## 2015-01-06 MED ORDER — DICLOFENAC SODIUM 1 % TD GEL
2.0000 g | Freq: Two times a day (BID) | TRANSDERMAL | Status: DC
  Filled 2015-01-06: qty 100

## 2015-01-06 MED ORDER — CLINDAMYCIN PHOSPHATE 600 MG/50ML IV SOLN
600.0000 mg | Freq: Three times a day (TID) | INTRAVENOUS | Status: DC
  Administered 2015-01-06 – 2015-01-08 (×5): 600 mg via INTRAVENOUS
  Filled 2015-01-06 (×10): qty 50

## 2015-01-06 MED ORDER — SPIRONOLACTONE 25 MG PO TABS
12.5000 mg | ORAL_TABLET | Freq: Every day | ORAL | Status: DC
  Administered 2015-01-07 – 2015-01-08 (×2): 12.5 mg via ORAL
  Filled 2015-01-06 (×2): qty 1

## 2015-01-06 NOTE — Progress Notes (Signed)
ANTIBIOTIC CONSULT NOTE  Pharmacy Consult for Clindamycin Indication: cellulitis  Allergies  Allergen Reactions  . Cymbalta [Duloxetine Hcl] Other (See Comments)    Confusion   . Procaine Hcl Hives and Other (See Comments)    NOVOCAINE: Sweating, Confusion, Not in right state of mind.  [name]    unknown   Patient Measurements: Height: 5\' 8"  (172.7 cm) Weight: 189 lb 9.5 oz (86 kg) IBW/kg (Calculated) : 68.4  Vital Signs: Temp: 98.4 F (36.9 C) (01/12 0855) Temp Source: Oral (01/12 0855) BP: 113/76 mmHg (01/12 0855) Pulse Rate: 78 (01/12 0855) Intake/Output from previous day: 01/11 0701 - 01/12 0700 In: 1593.3 [P.O.:720; I.V.:873.3] Out: 1150 [Urine:1150] Intake/Output from this shift: Total I/O In: 240 [P.O.:240] Out: -   Labs:  Recent Labs  01/04/15 0555 01/05/15 0550 01/06/15 0540  WBC 8.3 9.2 6.1  HGB 10.9* 12.1* 11.1*  PLT 74* 124* 103*  CREATININE 1.57* 1.65* 1.78*   Estimated Creatinine Clearance: 34.7 mL/min (by C-G formula based on Cr of 1.78). No results for input(s): VANCOTROUGH, VANCOPEAK, VANCORANDOM, GENTTROUGH, GENTPEAK, GENTRANDOM, TOBRATROUGH, TOBRAPEAK, TOBRARND, AMIKACINPEAK, AMIKACINTROU, AMIKACIN in the last 72 hours.   Microbiology: Recent Results (from the past 720 hour(s))  Blood culture (routine x 2)     Status: None (Preliminary result)   Collection Time: 01/03/15  9:27 PM  Result Value Ref Range Status   Specimen Description BLOOD LEFT ANTECUBITAL  Final   Special Requests BOTTLES DRAWN AEROBIC AND ANAEROBIC 8CC  Final   Culture NO GROWTH 3 DAYS  Final   Report Status PENDING  Incomplete  Blood culture (routine x 2)     Status: None (Preliminary result)   Collection Time: 01/03/15  9:35 PM  Result Value Ref Range Status   Specimen Description BLOOD LEFT ANTECUBITAL  Final   Special Requests BOTTLES DRAWN AEROBIC AND ANAEROBIC 8CC  Final   Culture NO GROWTH 3 DAYS  Final   Report Status  PENDING  Incomplete  MRSA PCR Screening     Status: Abnormal   Collection Time: 01/03/15 11:45 PM  Result Value Ref Range Status   MRSA by PCR POSITIVE (A) NEGATIVE Final    Comment:        The GeneXpert MRSA Assay (FDA approved for NASAL specimens only), is one component of a comprehensive MRSA colonization surveillance program. It is not intended to diagnose MRSA infection nor to guide or monitor treatment for MRSA infections. RESULT CALLED TO, READ BACK BY AND VERIFIED WITH: RESULT CALLED TO, READ BACK BY AND VERIFIED WITH:  FOOTE,L @ 0330 ON 01/04/15 BY WOODIE,J     Anti-infectives    Start     Dose/Rate Route Frequency Ordered Stop   01/06/15 1800  clindamycin (CLEOCIN) IVPB 600 mg     600 mg100 mL/hr over 30 Minutes Intravenous Every 8 hours 01/06/15 1207     01/05/15 1000  vancomycin (VANCOCIN) 1,500 mg in sodium chloride 0.9 % 500 mL IVPB  Status:  Discontinued     1,500 mg250 mL/hr over 120 Minutes Intravenous Every 24 hours 01/05/15 0913 01/06/15 1203   01/04/15 0000  clindamycin (CLEOCIN) IVPB 600 mg  Status:  Discontinued     600 mg100 mL/hr over 30 Minutes Intravenous Every 8 hours 01/03/15 2321 01/05/15 0908   01/03/15 2115  vancomycin (VANCOCIN) IVPB 1000 mg/200 mL premix     1,000 mg200 mL/hr over 60 Minutes Intravenous  Once 01/03/15 2109 01/03/15 2256  Assessment: 81 yo M admitted with cellulitis & started on Clindamycin per cellulitis protocol.  Pt was switched to Vancomycin but renal fxn is getting worse.  Pt did get Vancomycin dose this am.  Discussed with Dr Caryn Section and she decided to switch back to Clindamycin and monitor progress.   He is afebrile with normal WBC.    Clindamycin 1/9>>1/11, 1/12 >> Vancomycin 1/11>>1/12  Goal of Therapy:  Vancomycin trough level 10-15 mcg/ml  Plan:  Clindamycin 600mg  IV q8hrs (starting tonight) Monitor renal function and cx data   Hart Robinsons A 01/06/2015,12:09 PM

## 2015-01-06 NOTE — Progress Notes (Signed)
Pharmacy Note:  Digoxin dosing & Recommendations  Estimated Creatinine Clearance: 34.7 mL/min (by C-G formula based on Cr of 1.78).   Allergies  Allergen Reactions  . Cymbalta [Duloxetine Hcl] Other (See Comments)    Confusion   . Procaine Hcl Hives and Other (See Comments)    NOVOCAINE: Sweating, Confusion, Not in right state of mind.  [name]    unknown    Filed Vitals:   01/06/15 0855  BP: 113/76  Pulse: 78  Temp: 98.4 F (36.9 C)  Resp: 20   Assessment:  81yo male currently on Digoxin 266mcg daily.  Pt has worsening renal fxn.  Dr Caryn Section asked pharmacy to offer recommendations for dose adjustment if needed.  Pt has received Digoxin dose for today.    Plan: Check Digoxin level and SCr tomorrow am Adjust digoxin dose if warranted.  Ena Dawley, [facility] 01/06/2015 12:27 PM

## 2015-01-06 NOTE — Progress Notes (Signed)
In and Out catheter performed per MD orders,present at bedside was [facility] Chade Pitner LPN,and Merian Capron NT,all  Peri care provided. Post void residual checked with 300 cc's of straw color urine to return,patient tolerated procedure well. Will continue to monitor patient.

## 2015-01-06 NOTE — Progress Notes (Signed)
[facility] for Lovenox-->Coumadin Indication: atrial fibrillation, Hx VTE  Allergies  Allergen Reactions  . Cymbalta [Duloxetine Hcl] Other (See Comments)    Confusion   . Procaine Hcl Hives and Other (See Comments)    NOVOCAINE: Sweating, Confusion, Not in right state of mind.  [name]    unknown   Patient Measurements: Height: 5\' 8"  (172.7 cm) Weight: 189 lb 9.5 oz (86 kg) IBW/kg (Calculated) : 68.4  Vital Signs: Temp: 98.4 F (36.9 C) (01/12 0855) Temp Source: Oral (01/12 0855) BP: 113/76 mmHg (01/12 0855) Pulse Rate: 78 (01/12 0855)  Labs:  Recent Labs  01/04/15 0555 01/05/15 0550 01/06/15 0540  HGB 10.9* 12.1* 11.1*  HCT 32.7* 36.5* 33.0*  PLT 74* 124* 103*  LABPROT 23.7* 19.8* 22.5*  INR 2.10* 1.66* 1.96*  CREATININE 1.57* 1.65* 1.78*   Estimated Creatinine Clearance: 34.7 mL/min (by C-G formula based on Cr of 1.78).  Medical History: Past Medical History  Diagnosis Date  . COPD (chronic obstructive pulmonary disease)     Uses occasional nighttime O2  . Disseminated herpes zoster 2010  . GERD (gastroesophageal reflux disease)   . Asthma   . Pulmonary embolus     2003 and 2011  . Pulmonary nodules     Chest CT 09/11  . Mixed hyperlipidemia   . Atrial fibrillation     CHADS2 score 2  . Lupus anticoagulant positive   . Essential hypertension, benign   . Cholelithiasis   . Rheumatoid arthritis(714.0)   . History of chicken pox 1941; 2011  . Anemia   . Chronic lower back pain   . Chronic diastolic heart failure   . Cirrhosis     Questionable, AFP normal Feb 01/2012, hx of ETOH use   . Headache(784.0) 10/21/2013  . Cervical spondylosis without myelopathy 10/21/2013  . Stage III chronic kidney disease 01/05/2015    Baseline creatinine 1. for 5  . Cellulitis of leg, left 01/03/2015   Medications:  Medications Prior to Admission  Medication Sig Dispense Refill  . albuterol  (PROAIR HFA) 108 (90 BASE) MCG/ACT inhaler Inhale 2 puffs into the lungs every 6 (six) hours as needed. Shortness of Breath    . albuterol (PROVENTIL) (2.5 MG/3ML) 0.083% nebulizer solution Take 2.5 mg by nebulization every 6 (six) hours as needed. Shortness of Breath    . cetirizine (ZYRTEC) 10 MG tablet Take 10 mg by mouth daily.      . cloNIDine (CATAPRES) 0.1 MG tablet Take 1 tablet (0.1 mg total) by mouth daily. For 1 week; then, STOP CLONIDINE 7 tablet 0  . clotrimazole-betamethasone (LOTRISONE) cream Apply 1 application topically daily.     . digoxin (DIGOX) 0.25 MG tablet Take 1 tablet (250 mcg total) by mouth daily. (Patient taking differently: Take 250 mcg by mouth every morning. ) 30 tablet 11  . diltiazem (CARDIZEM CD) 300 MG 24 hr capsule Take 1 capsule (300 mg total) by mouth daily. (Patient taking differently: Take 300 mg by mouth every evening. ) 30 capsule 11  . Etanercept (ENBREL) 25 MG/0.5ML SOSY Inject into the skin 2 (two) times a week. Twice a week.    . fluticasone (FLONASE) 50 MCG/ACT nasal spray Place 2 sprays into the nose as needed for allergies.     . folic acid (FOLVITE) 1 MG tablet Take 1 mg by mouth daily.     . furosemide (LASIX) 40 MG tablet Take 40 mg by mouth 2 (two)  times daily. *Sometimes takes two tablets in the morning and one tablet in the evening as regimen depending on fluid retention    . gabapentin (NEURONTIN) 300 MG capsule TAKE ONE CAPSULE BY MOUTH EVERY MORNING AND TWO CAPSULES EVERY EVENING 90 capsule 3  . HYDROcodone-acetaminophen (NORCO) 10-325 MG per tablet Take 1 tablet by mouth 2 (two) times daily.    Marland Kitchen ketoconazole (NIZORAL) 2 % shampoo Apply 1 application topically 3 (three) times a week.     . methocarbamol (ROBAXIN) 500 MG tablet Take 1 tablet by mouth 2 (two) times daily.    . methotrexate (RHEUMATREX) 2.5 MG tablet Take 10 mg by mouth 2 (two) times a week. *TAKE 8 TABLETS BY MOUTH FOR A TOTAL 20MG  WEEKLY Patient takes 4 tablets (10Mg ) on  Fridays and 4 tablets (10mg ) on Saturdays    . mometasone (NASONEX) 50 MCG/ACT nasal spray Place 2 sprays into the nose daily.    . Omega-3 Fatty Acids (FISH OIL) 1000 MG CAPS Take 2 capsules by mouth 2 (two) times daily.     Marland Kitchen omeprazole (PRILOSEC) 20 MG capsule Take 20 mg by mouth 2 (two) times daily.     Marland Kitchen oxyCODONE-acetaminophen (PERCOCET) 10-325 MG per tablet Take 1 tablet by mouth every 6 (six) hours as needed for pain.    . potassium chloride SA (K-DUR,KLOR-CON) 20 MEQ tablet Take 20 mEq by mouth 2 (two) times daily.     Marland Kitchen spironolactone (ALDACTONE) 25 MG tablet Take 25 mg by mouth daily.    . VOLTAREN 1 % GEL Apply 2 g topically daily as needed (pain).     Marland Kitchen warfarin (COUMADIN) 2 MG tablet Take 6 mg by mouth See admin instructions. Takes 6 mg (3 tablets) daily except on Wednesdays doesn't take at all (MANAGED BY PMD)    . hydrOXYzine (ATARAX/VISTARIL) 25 MG tablet 1 at hs, or q6h prn itching. (Patient not taking: Reported on 01/03/2015) 12 tablet 0  . naproxen (NAPROSYN) 500 MG tablet Take 1 tablet (500 mg total) by mouth 2 (two) times daily with a meal. (Patient not taking: Reported on 01/03/2015) 30 tablet 0  . ondansetron (ZOFRAN ODT) 4 MG disintegrating tablet Take 1 tablet (4 mg total) by mouth every 8 (eight) hours as needed for nausea. (Patient not taking: Reported on 01/03/2015) 12 tablet 0  . predniSONE (DELTASONE) 10 MG tablet 5,4,3,2,1 - take with food (Patient not taking: Reported on 01/03/2015) 15 tablet 0   Assessment: 81 yo M on chronic Coumadin for Afib and +Lupus AC with history of multiple PEs.  Home dose listed above.  INR was therapeutic on admission, but has trended below goal range on home dose.   Pharmacy was asked to assume dosing of warfarin today and add Lovenox to bridge until INR back in therapeutic range given patient's high risk for developing blood clot.   CBC reviewed.  Platelet count has been <100K this admission, but is now slightly better.  INR is not quite at  goal. No bleeding noted.  Acute kidney injury also noted.  Estimated CrCl >61ml/min.    Goal of Therapy:  INR 2-3 Monitor platelets by anticoagulation protocol: Yes   Plan:   Coumadin 10mg  po x1 today  Daily INR  Overlap with Lovenox 1mg /kg q12h until INR>2  Monitor CBC & renal function & s/sx bleeding  Hart Robinsons A 01/06/2015,11:08 AM

## 2015-01-06 NOTE — Progress Notes (Signed)
[facility] PROGRESS NOTE  [name] EHU:[id number] DOB: 1931-11-29 DOA: 01/03/2015 PCP: [name], MD    Code Status: Full code Family Communication: Discussed with daughter Disposition Plan: Discharge when clinically appropriate   Consultants:  None  Procedures:  None  Antibiotics:  Clindamycin 1/9-1/11. Restarted on 01/06/15>  Vancomycin 1/9-1/12/16.   HPI/Subjective: Patient says that the redness continued to decrease on his leg. Is still quite "sore", particularly when he attempts to walk. He says that his urine output has slowed down some. He always has a slow stream.  Objective: Filed Vitals:   01/06/15 1459  BP: 90/32  Pulse: 50  Temp: 98 F (36.7 C)  Resp: 20     Intake/Output Summary (Last 24 hours) at 01/06/15 1523 Last data filed at 01/06/15 0930  Gross per 24 hour  Intake 1353.33 ml  Output    850 ml  Net 503.33 ml   Filed Weights   01/03/15 1501 01/03/15 2322  Weight: 90.266 kg (199 lb) 86 kg (189 lb 9.5 oz)    Exam:   General:  Elderly 81 year old man laying in bed, in no acute distress.  Cardiovascular: Irregular, irregular, with 2/6 systolic murmur.  Respiratory: Breathing is nonlabored, but occasional wheezes auscultated in the bases.  Abdomen: Positive bowel sounds, soft, nontender, nondistended.  Musculoskeletal/extremities: There is some peri-ankle swelling on the left and exquisite tenderness over the pretibial area of the left leg; erythema noted below the knee to the ankle on the left with some scattered areas of resolution proximally and distally. Right leg without edema or abnormalities.  Neurologic: He is alert and oriented 3. Cranial nerves II through XII are grossly intact.  Data Reviewed: Basic Metabolic Panel:  Recent Labs Lab 01/03/15 2005 01/04/15 0555 01/05/15 0550 01/06/15 0540  NA 136 139 139 139  K 3.7 4.0 4.1 4.3  CL 96 99 98 101  CO2 33* 32 32 30  GLUCOSE 107* 120* 117* 94   BUN 26* 25* 25* 31*  CREATININE 1.69* 1.57* 1.65* 1.78*  CALCIUM 9.0 8.8 9.1 8.5   Liver Function Tests:  Recent Labs Lab 01/03/15 2005 01/05/15 0550  AST 24 27  ALT 30 26  ALKPHOS 57 54  BILITOT 1.1 1.5*  PROT 7.7 7.4  ALBUMIN 4.1 3.8   No results for input(s): LIPASE, AMYLASE in the last 168 hours. No results for input(s): AMMONIA in the last 168 hours. CBC:  Recent Labs Lab 01/03/15 2005 01/04/15 0555 01/05/15 0550 01/06/15 0540  WBC 9.9 8.3 9.2 6.1  NEUTROABS 5.9  --   --   --   HGB 11.7* 10.9* 12.1* 11.1*  HCT 35.6* 32.7* 36.5* 33.0*  MCV 107.6* 107.2* 107.0* 106.8*  PLT 81* 74* 124* 103*   Cardiac Enzymes: No results for input(s): CKTOTAL, CKMB, CKMBINDEX, TROPONINI in the last 168 hours. BNP (last 3 results) No results for input(s): PROBNP in the last 8760 hours. CBG:  Recent Labs Lab 01/04/15 1649  GLUCAP 117*    Recent Results (from the past 240 hour(s))  Blood culture (routine x 2)     Status: None (Preliminary result)   Collection Time: 01/03/15  9:27 PM  Result Value Ref Range Status   Specimen Description BLOOD LEFT ANTECUBITAL  Final   Special Requests BOTTLES DRAWN AEROBIC AND ANAEROBIC 8CC  Final   Culture NO GROWTH 3 DAYS  Final   Report Status PENDING  Incomplete  Blood culture (routine x 2)     Status: None (Preliminary result)  Collection Time: 01/03/15  9:35 PM  Result Value Ref Range Status   Specimen Description BLOOD LEFT ANTECUBITAL  Final   Special Requests BOTTLES DRAWN AEROBIC AND ANAEROBIC 8CC  Final   Culture NO GROWTH 3 DAYS  Final   Report Status PENDING  Incomplete  MRSA PCR Screening     Status: Abnormal   Collection Time: 01/03/15 11:45 PM  Result Value Ref Range Status   MRSA by PCR POSITIVE (A) NEGATIVE Final    Comment:        The GeneXpert MRSA Assay (FDA approved for NASAL specimens only), is one component of a comprehensive MRSA colonization surveillance program. It is not intended to diagnose  MRSA infection nor to guide or monitor treatment for MRSA infections. RESULT CALLED TO, READ BACK BY AND VERIFIED WITH: RESULT CALLED TO, READ BACK BY AND VERIFIED WITH:  FOOTE,L @ 0330 ON 01/04/15 BY WOODIE,J      Studies: No results found.  Scheduled Meds: . Chlorhexidine Gluconate Cloth  6 each Topical Q0600  . clindamycin (CLEOCIN) IV  600 mg Intravenous Q8H  . diclofenac sodium  2 g Topical BID  . digoxin  250 mcg Oral q morning - 10a  . diltiazem  300 mg Oral QPM  . enoxaparin (LOVENOX) injection  80 mg Subcutaneous Q12H  . furosemide  40 mg Oral Daily  . gabapentin  300 mg Oral Daily  . gabapentin  600 mg Oral QHS  . HYDROcodone-acetaminophen  1 tablet Oral BID  . methocarbamol  500 mg Oral BID  . methotrexate  10 mg Oral Once per day on Mon Thu  . mupirocin ointment  1 application Nasal BID  . pantoprazole  40 mg Oral Daily  . potassium chloride SA  20 mEq Oral BID  . spironolactone  25 mg Oral Daily  . warfarin  10 mg Oral Once  . Warfarin - Pharmacist Dosing Inpatient   Does not apply Q24H   Continuous Infusions: . sodium chloride 50 mL/hr at 01/06/15 0321   Assessment and plan: Principal Problem:   Cellulitis of leg, left Active Problems:   Chronic atrial fibrillation   Acute kidney injury   Stage III chronic kidney disease   Essential hypertension, benign   Rheumatoid arthritis   Lupus anticoagulant positive   COPD (chronic obstructive pulmonary disease)   Long term current use of anticoagulant   Chronic diastolic heart failure   Cervical spondylosis without myelopathy   Thrombocytopenia   1. Left lower extremity cellulitis. There appears to be progressive improvement, but there is still quite a bit of erythema, mild edema, and moderate tenderness. We'll discontinue vancomycin in favor of clindamycin IV given the patient's worsening creatinine. The patient could likely be transitioned to oral clindamycin when he is ready for discharge.  We'll hold  off on ordering lower extremity venous Doppler of his leg as his INR was therapeutic on admission.  Chronic atrial fibrillation. We'll continue Coumadin, but I have asked the pharmacist to dose it.  Lovenox was started as a bridge per pharmacy  because his INR fell below 2.0. (Patient has history of PE and multiple DVTs). His rate is controlled and is intermittently bradycardic.. Will continue digoxin and diltiazem, but will place open parameters to hold him for heart rate of less than 60. Digoxin level pending for tomorrow in light of worsening creatinine.  History of DVT/PE/history of lupus anticoagulant. Coumadin and Lovenox as above. Will discontinue Lovenox when his INR is between 2 and 3.  Hypertension. Currently running on the low end of normal. Per outpatient note by his cardiologist Dr. Domenic Polite, the patient's blood pressure runs on the lower end of normal. Clonidine was being titrated off in the outpatient setting. We'll go ahead and discontinue it altogether. Hold parameters to hold diltiazem for systolic blood pressure less than 100.  Chronic diastolic heart failure. Appears compensated. Will continue Lasix and Aldactone. Consider decreasing doses due to acute kidney injury.  Acute kidney injury superimposed on stage III chronic kidney disease. Obtained records from the patient's PCP and it appears that his baseline creatinine is 1.4. It was 1.69 on admission. Increase to 1.78 today. Lasix was decreased from twice a day to daily; but will hold it for now. We'll decrease dose of Aldactone as well. Will start IV fluids and continue to follow his renal function. Discontinue IV fluids when his creatinine improves. Asked nursing to check for postvoid residual. The patient was scheduled to see nephrology in the outpatient setting by his PCP.  COPD. Currently stable. Continue when necessary albuterol nebulizer.  Rheumatoid arthritis. Continue analgesics and methotrexate as  ordered.  Macrocytic anemia and thrombocytopenia. This is most likely secondary to methotrexate. His TSH and vitamin B12 level were within normal limits.    Time spent: 35 minutes    [facility] Pager [phone]. If 7PM-7AM, please contact night-coverage at [email or web address], password [facility] 01/06/2015, 3:23 PM  LOS: 3 days

## 2015-01-07 LAB — BASIC METABOLIC PANEL
ANION GAP: 6 (ref 5–15)
BUN: 28 mg/dL — ABNORMAL HIGH (ref 6–23)
CO2: 29 mmol/L (ref 19–32)
Calcium: 8.2 mg/dL — ABNORMAL LOW (ref 8.4–10.5)
Chloride: 103 mEq/L (ref 96–112)
Creatinine, Ser: 1.54 mg/dL — ABNORMAL HIGH (ref 0.50–1.35)
GFR calc Af Amer: 47 mL/min — ABNORMAL LOW (ref >90)
GFR, EST NON AFRICAN AMERICAN: 41 mL/min — AB (ref >90)
GLUCOSE: 90 mg/dL (ref 70–99)
Potassium: 4.4 mmol/L (ref 3.5–5.1)
SODIUM: 138 mmol/L (ref 135–145)

## 2015-01-07 LAB — CBC
HCT: 31.4 % — ABNORMAL LOW (ref 39.0–52.0)
Hemoglobin: 10.7 g/dL — ABNORMAL LOW (ref 13.0–17.0)
MCH: 36.5 pg — AB (ref 26.0–34.0)
MCHC: 34.1 g/dL (ref 30.0–36.0)
MCV: 107.2 fL — ABNORMAL HIGH (ref 78.0–100.0)
PLATELETS: 113 10*3/uL — AB (ref 150–400)
RBC: 2.93 MIL/uL — ABNORMAL LOW (ref 4.22–5.81)
RDW: 16.7 % — ABNORMAL HIGH (ref 11.5–15.5)
WBC: 4.5 10*3/uL (ref 4.0–10.5)

## 2015-01-07 LAB — DIGOXIN LEVEL: Digoxin Level: 2.5 ng/mL — ABNORMAL HIGH (ref 0.8–2.0)

## 2015-01-07 LAB — PROTIME-INR
INR: 3.08 — AB (ref 0.00–1.49)
Prothrombin Time: 32 seconds — ABNORMAL HIGH (ref 11.6–15.2)

## 2015-01-07 MED ORDER — DIGOXIN 125 MCG PO TABS
0.1250 mg | ORAL_TABLET | Freq: Every day | ORAL | Status: DC
  Administered 2015-01-08: 0.125 mg via ORAL
  Filled 2015-01-07: qty 1

## 2015-01-07 MED ORDER — WARFARIN SODIUM 1 MG PO TABS
1.0000 mg | ORAL_TABLET | Freq: Once | ORAL | Status: AC
  Administered 2015-01-07: 1 mg via ORAL
  Filled 2015-01-07: qty 1

## 2015-01-07 MED ORDER — ETANERCEPT 25 MG ~~LOC~~ KIT
25.0000 mg | PACK | SUBCUTANEOUS | Status: DC
  Administered 2015-01-07: 25 mg via SUBCUTANEOUS

## 2015-01-07 MED ORDER — ETANERCEPT 25 MG ~~LOC~~ KIT: 25.0000 mg | PACK | SUBCUTANEOUS | Status: DC

## 2015-01-07 NOTE — Progress Notes (Signed)
[facility] PROGRESS NOTE  [name] JKD:[id number] DOB: 04/18/1931 DOA: 01/03/2015 PCP: [name], MD  Assessment/Plan: 1. Left lower extremity cellulitis. Appears to be continuing to improve. Large amount erythema but receding, mild edema, and mild tenderness and moderate heat. Will continue clindamycin IV. Consider transitioned to oral clindamycin when he is ready for discharge hopefully tomorrow. No extremity venous Doppler of his leg as his INR was therapeutic on admission.  Chronic atrial fibrillation. INR 3.08. (Patient has history of PE and multiple DVTs). Rate controlled.  Will continue digoxin and diltiazem with parameters. Dig  Level slightly elevated at 2.5. Dig given this am but discontinued.   History of DVT/PE/history of lupus anticoagulant. Coumadin and Lovenox as above. Will discontinue Lovenox when his INR is between 2 and 3.  Hypertension. Currently running on the low end of normal.Per outpatient note by his cardiologist Dr. Domenic Polite, the patient's blood pressure runs on the lower end of normal.Hold parameters to hold diltiazem for systolic blood pressure less than 100.  Chronic diastolic heart failure. remains compensated. Will continue Aldactone.   Acute kidney injury superimposed on stage III chronic kidney disease. Obtained records from the patient's PCP and it appears that his baseline creatinine is 1.4. It was 1.69 on admission. Creatinine 1.54 today. Holding lasix and aldactone dose decreased. Continue gentle IV fluids and continue to follow his renal function. Discontinue IV fluids when his creatinine improves. The patient was scheduled to see nephrology in the outpatient setting by his PCP.  COPD. Currently stable. Continue when necessary albuterol nebulizer.  Rheumatoid arthritis. Continue analgesics and methotrexate as ordered.  Macrocytic anemia and thrombocytopenia. This is most likely secondary to methotrexate. His TSH and vitamin  B12 level were within normal limits  Code Status: full Family Communication: none present Disposition Plan: home hopefully tomorrow   Consultants:  none  Procedures:  none  Antibiotics:  Clindamycin 1/9-1/11. Restarted on 01/06/15>  Vancomycin 1/9-1/12/16.  HPI/Subjective: Reports less LE pain/swelling/erythema.   Objective: Filed Vitals:   01/07/15 0700  BP: 121/57  Pulse: 63  Temp: 98.4 F (36.9 C)  Resp: 19    Intake/Output Summary (Last 24 hours) at 01/07/15 1126 Last data filed at 01/07/15 0900  Gross per 24 hour  Intake 1319.17 ml  Output   2375 ml  Net -1055.83 ml   Filed Weights   01/03/15 2322 01/06/15 1700 01/07/15 0700  Weight: 86 kg (189 lb 9.5 oz) 89.404 kg (197 lb 1.6 oz) 89.812 kg (198 lb)    Exam:   General:  Well nourished comfortable  Cardiovascular: irregularly irregular +murmur left LE with receding erythema, mild tenderness and edema and moderate heat  Respiratory: normal effort BS distant but clear no wheeze  Abdomen: flat soft +BS non-tender to palpation  Musculoskeletal:  No clubbing or cyanosis  Data Reviewed: Basic Metabolic Panel:  Recent Labs Lab 01/03/15 2005 01/04/15 0555 01/05/15 0550 01/06/15 0540 01/07/15 0530  NA 136 139 139 139 138  K 3.7 4.0 4.1 4.3 4.4  CL 96 99 98 101 103  CO2 33* 32 32 30 29  GLUCOSE 107* 120* 117* 94 90  BUN 26* 25* 25* 31* 28*  CREATININE 1.69* 1.57* 1.65* 1.78* 1.54*  CALCIUM 9.0 8.8 9.1 8.5 8.2*   Liver Function Tests:  Recent Labs Lab 01/03/15 2005 01/05/15 0550  AST 24 27  ALT 30 26  ALKPHOS 57 54  BILITOT 1.1 1.5*  PROT 7.7 7.4  ALBUMIN 4.1 3.8   No results for input(s): LIPASE,  AMYLASE in the last 168 hours. No results for input(s): AMMONIA in the last 168 hours. CBC:  Recent Labs Lab 01/03/15 2005 01/04/15 0555 01/05/15 0550 01/06/15 0540 01/07/15 0530  WBC 9.9 8.3 9.2 6.1 4.5  NEUTROABS 5.9  --   --   --   --   HGB 11.7* 10.9* 12.1* 11.1* 10.7*  HCT  35.6* 32.7* 36.5* 33.0* 31.4*  MCV 107.6* 107.2* 107.0* 106.8* 107.2*  PLT 81* 74* 124* 103* 113*   Cardiac Enzymes: No results for input(s): CKTOTAL, CKMB, CKMBINDEX, TROPONINI in the last 168 hours. BNP (last 3 results) No results for input(s): PROBNP in the last 8760 hours. CBG:  Recent Labs Lab 01/04/15 1649  GLUCAP 117*    Recent Results (from the past 240 hour(s))  Blood culture (routine x 2)     Status: None (Preliminary result)   Collection Time: 01/03/15  9:27 PM  Result Value Ref Range Status   Specimen Description BLOOD LEFT ANTECUBITAL  Final   Special Requests BOTTLES DRAWN AEROBIC AND ANAEROBIC 8CC  Final   Culture NO GROWTH 3 DAYS  Final   Report Status PENDING  Incomplete  Blood culture (routine x 2)     Status: None (Preliminary result)   Collection Time: 01/03/15  9:35 PM  Result Value Ref Range Status   Specimen Description BLOOD LEFT ANTECUBITAL  Final   Special Requests BOTTLES DRAWN AEROBIC AND ANAEROBIC 8CC  Final   Culture NO GROWTH 3 DAYS  Final   Report Status PENDING  Incomplete  MRSA PCR Screening     Status: Abnormal   Collection Time: 01/03/15 11:45 PM  Result Value Ref Range Status   MRSA by PCR POSITIVE (A) NEGATIVE Final    Comment:        The GeneXpert MRSA Assay (FDA approved for NASAL specimens only), is one component of a comprehensive MRSA colonization surveillance program. It is not intended to diagnose MRSA infection nor to guide or monitor treatment for MRSA infections. RESULT CALLED TO, READ BACK BY AND VERIFIED WITH: RESULT CALLED TO, READ BACK BY AND VERIFIED WITH:  FOOTE,L @ 0330 ON 01/04/15 BY WOODIE,J      Studies: No results found.  Scheduled Meds: . Chlorhexidine Gluconate Cloth  6 each Topical Q0600  . clindamycin (CLEOCIN) IV  600 mg Intravenous Q8H  . diclofenac sodium  2 g Topical BID  . diltiazem  300 mg Oral QPM  . [START ON 01/08/2015] etanercept (ENBREL) SQ  25 mg Subcutaneous Once per day on Mon Thu  .  gabapentin  300 mg Oral Daily  . gabapentin  600 mg Oral QHS  . HYDROcodone-acetaminophen  1 tablet Oral BID  . methocarbamol  500 mg Oral BID  . methotrexate  10 mg Oral Once per day on Mon Thu  . mupirocin ointment  1 application Nasal BID  . pantoprazole  40 mg Oral Daily  . potassium chloride SA  20 mEq Oral BID  . spironolactone  12.5 mg Oral Daily  . Warfarin - Pharmacist Dosing Inpatient   Does not apply Q24H   Continuous Infusions: . sodium chloride 100 mL/hr at 01/07/15 0754    Principal Problem:   Cellulitis of leg, left Active Problems:   Essential hypertension, benign   Chronic atrial fibrillation   Rheumatoid arthritis   Lupus anticoagulant positive   COPD (chronic obstructive pulmonary disease)   Long term current use of anticoagulant   Chronic diastolic heart failure   Cervical spondylosis without myelopathy  Acute kidney injury   Thrombocytopenia   Stage III chronic kidney disease    Time spent: [place] [facility] Pager [phone]. If 7PM-7AM, please contact night-coverage at [email or web address], password [facility] 01/07/2015, 11:26 AM  LOS: 4 days

## 2015-01-07 NOTE — Progress Notes (Signed)
[place] for Lovenox-->Coumadin Indication: atrial fibrillation, Hx VTE  Allergies  Allergen Reactions  . Cymbalta [Duloxetine Hcl] Other (See Comments)    Confusion   . Procaine Hcl Hives and Other (See Comments)    NOVOCAINE: Sweating, Confusion, Not in right state of mind.  [name]    unknown   Patient Measurements: Height: 5\' 8"  (172.7 cm) Weight: 198 lb (89.812 kg) IBW/kg (Calculated) : 68.4  Vital Signs: Temp: 98.4 F (36.9 C) (01/13 0700) Temp Source: Oral (01/13 0700) BP: 121/57 mmHg (01/13 0700) Pulse Rate: 63 (01/13 0700)  Labs:  Recent Labs  01/05/15 0550 01/06/15 0540 01/07/15 0530  HGB 12.1* 11.1* 10.7*  HCT 36.5* 33.0* 31.4*  PLT 124* 103* 113*  LABPROT 19.8* 22.5* 32.0*  INR 1.66* 1.96* 3.08*  CREATININE 1.65* 1.78* 1.54*   Estimated Creatinine Clearance: 41 mL/min (by C-G formula based on Cr of 1.54).  Medical History: Past Medical History  Diagnosis Date  . COPD (chronic obstructive pulmonary disease)     Uses occasional nighttime O2  . Disseminated herpes zoster 2010  . GERD (gastroesophageal reflux disease)   . Asthma   . Pulmonary embolus     2003 and 2011  . Pulmonary nodules     Chest CT 09/11  . Mixed hyperlipidemia   . Atrial fibrillation     CHADS2 score 2  . Lupus anticoagulant positive   . Essential hypertension, benign   . Cholelithiasis   . Rheumatoid arthritis(714.0)   . History of chicken pox 1941; 2011  . Anemia   . Chronic lower back pain   . Chronic diastolic heart failure   . Cirrhosis     Questionable, AFP normal Feb 01/2012, hx of ETOH use   . Headache(784.0) 10/21/2013  . Cervical spondylosis without myelopathy 10/21/2013  . Stage III chronic kidney disease 01/05/2015    Baseline creatinine 1. for 5  . Cellulitis of leg, left 01/03/2015   Medications:  Medications Prior to Admission  Medication Sig Dispense Refill  . albuterol  (PROAIR HFA) 108 (90 BASE) MCG/ACT inhaler Inhale 2 puffs into the lungs every 6 (six) hours as needed. Shortness of Breath    . albuterol (PROVENTIL) (2.5 MG/3ML) 0.083% nebulizer solution Take 2.5 mg by nebulization every 6 (six) hours as needed. Shortness of Breath    . cetirizine (ZYRTEC) 10 MG tablet Take 10 mg by mouth daily.      . cloNIDine (CATAPRES) 0.1 MG tablet Take 1 tablet (0.1 mg total) by mouth daily. For 1 week; then, STOP CLONIDINE 7 tablet 0  . clotrimazole-betamethasone (LOTRISONE) cream Apply 1 application topically daily.     . digoxin (DIGOX) 0.25 MG tablet Take 1 tablet (250 mcg total) by mouth daily. (Patient taking differently: Take 250 mcg by mouth every morning. ) 30 tablet 11  . diltiazem (CARDIZEM CD) 300 MG 24 hr capsule Take 1 capsule (300 mg total) by mouth daily. (Patient taking differently: Take 300 mg by mouth every evening. ) 30 capsule 11  . Etanercept (ENBREL) 25 MG/0.5ML SOSY Inject into the skin 2 (two) times a week. Twice a week.    . fluticasone (FLONASE) 50 MCG/ACT nasal spray Place 2 sprays into the nose as needed for allergies.     . folic acid (FOLVITE) 1 MG tablet Take 1 mg by mouth daily.     . furosemide (LASIX) 40 MG tablet Take 40 mg by mouth 2 (two) times daily. *  Sometimes takes two tablets in the morning and one tablet in the evening as regimen depending on fluid retention    . gabapentin (NEURONTIN) 300 MG capsule TAKE ONE CAPSULE BY MOUTH EVERY MORNING AND TWO CAPSULES EVERY EVENING 90 capsule 3  . HYDROcodone-acetaminophen (NORCO) 10-325 MG per tablet Take 1 tablet by mouth 2 (two) times daily.    Marland Kitchen ketoconazole (NIZORAL) 2 % shampoo Apply 1 application topically 3 (three) times a week.     . methocarbamol (ROBAXIN) 500 MG tablet Take 1 tablet by mouth 2 (two) times daily.    . methotrexate (RHEUMATREX) 2.5 MG tablet Take 10 mg by mouth 2 (two) times a week. *TAKE 8 TABLETS BY MOUTH FOR A TOTAL 20MG  WEEKLY Patient takes 4 tablets (10Mg ) on  Fridays and 4 tablets (10mg ) on Saturdays    . mometasone (NASONEX) 50 MCG/ACT nasal spray Place 2 sprays into the nose daily.    . Omega-3 Fatty Acids (FISH OIL) 1000 MG CAPS Take 2 capsules by mouth 2 (two) times daily.     Marland Kitchen omeprazole (PRILOSEC) 20 MG capsule Take 20 mg by mouth 2 (two) times daily.     Marland Kitchen oxyCODONE-acetaminophen (PERCOCET) 10-325 MG per tablet Take 1 tablet by mouth every 6 (six) hours as needed for pain.    . potassium chloride SA (K-DUR,KLOR-CON) 20 MEQ tablet Take 20 mEq by mouth 2 (two) times daily.     Marland Kitchen spironolactone (ALDACTONE) 25 MG tablet Take 25 mg by mouth daily.    . VOLTAREN 1 % GEL Apply 2 g topically daily as needed (pain).     Marland Kitchen warfarin (COUMADIN) 2 MG tablet Take 6 mg by mouth See admin instructions. Takes 6 mg (3 tablets) daily except on Wednesdays doesn't take at all (MANAGED BY PMD)    . hydrOXYzine (ATARAX/VISTARIL) 25 MG tablet 1 at hs, or q6h prn itching. (Patient not taking: Reported on 01/03/2015) 12 tablet 0  . naproxen (NAPROSYN) 500 MG tablet Take 1 tablet (500 mg total) by mouth 2 (two) times daily with a meal. (Patient not taking: Reported on 01/03/2015) 30 tablet 0  . ondansetron (ZOFRAN ODT) 4 MG disintegrating tablet Take 1 tablet (4 mg total) by mouth every 8 (eight) hours as needed for nausea. (Patient not taking: Reported on 01/03/2015) 12 tablet 0  . predniSONE (DELTASONE) 10 MG tablet 5,4,3,2,1 - take with food (Patient not taking: Reported on 01/03/2015) 15 tablet 0   Assessment: 81 yo M on chronic Coumadin for Afib and +Lupus AC with history of multiple PEs.  Home dose listed above.  INR was therapeutic on admission, but then trended below goal range on home dose.   Pharmacy was asked to assume dosing of warfarin and add Lovenox to bridge until INR back in therapeutic range given patient's high risk for developing blood clot.   INR is now at upper end of goal range.  CBC reviewed.  Platelet count has been <100K this admission, but is now slightly  better.  No bleeding noted.  Acute kidney injury also noted.  Estimated CrCl >9ml/min.    Goal of Therapy:  INR 2-3 Monitor platelets by anticoagulation protocol: Yes   Plan:   Coumadin 1 (one) mg po x1 today  Daily INR  D/C Lovenox today  Monitor CBC & renal function & s/sx bleeding  Hart Robinsons A 01/07/2015,11:41 AM

## 2015-01-07 NOTE — Progress Notes (Signed)
Pharmacy Note:  Digoxin dosing & Recommendations  Estimated Creatinine Clearance: 41 mL/min (by C-G formula based on Cr of 1.54).   Allergies  Allergen Reactions  . Cymbalta [Duloxetine Hcl] Other (See Comments)    Confusion   . Procaine Hcl Hives and Other (See Comments)    NOVOCAINE: Sweating, Confusion, Not in right state of mind.  [name]    unknown    Filed Vitals:   01/07/15 0700  BP: 121/57  Pulse: 63  Temp: 98.4 F (36.9 C)  Resp: 19   Assessment:  81yo male currently on Digoxin 233mcg (0.25mg ) daily.  Pt has elevated SCr.  Dr Caryn Section asked pharmacy to offer recommendations for dose adjustment if needed.   Digoxin level = 2.5ng/ml  Plan:  Decrease Digoxin to 0.125mg  po daily starting tomorrow  Anticipate will achieve digoxin level of 1.25ng/ml at steady state  F/U level at steady state if clinical status changes  Ena Dawley, [facility] 01/07/2015 11:34 AM

## 2015-01-07 NOTE — Evaluation (Signed)
Physical Therapy Evaluation Patient Details Name: [name] MRN: [id number] DOB: June 24, 1931 Today's Date: 01/07/2015   History of Present Illness  [name] is a 81 y.o. male  With a history of atrial fibrillation on anticoagulation with Coumadin, pulmonary embolism, lupus anticoagulant, GERD, COPD, chronic diastolic heart failure due to initial fibrillation. Patient has a three-day history of worsening redness and swelling and pain in his left lower extremity involving approximately two thirds of his lower leg. The symptoms are becoming worse. Walking increases the pain. No other provoking her. In factors.  Clinical Impression  Patient received supine in bed, pleasant and willing to participate in skilled PT session. Pt demonstrated WFL B leg ROM and strength, although L leg is weaker due to pain secondary to cellulitis. Bed mobility with I, sit to stand with single point cane and Mod(A), gait approximately 25 feet in room and S to CGA; however patient does demonstrate tendency to furniture walk, he does not demonstrate instability on turns and while navigating narrow spaces in his room. Gait distance limited by PT on this date due to pain in pt's L leg; pt did demonstrate excellent potential to walk further distance without difficulty. Patient stated that he is not interested in participating in or receiving HHPT, but did query about possibility of getting new shower chair. Social worker informed; pt left with all needs met and upright in chair with chair alarm on. Patient not in need of skilled PT services at this time.     Follow Up Recommendations No PT follow up (Pt states that he is not interested in participating in [place])    Equipment Recommendations  Other (comment) (shower chair)    Recommendations for Other Services       Precautions / Restrictions Precautions Precautions: Fall Precaution Comments: Significant edema and pain L leg due to cellulitis Restrictions Weight  Bearing Restrictions: No      Mobility  Bed Mobility Overal bed mobility: Independent                Transfers Overall transfer level: Modified independent Equipment used: Straight cane                Ambulation/Gait Ambulation/Gait assistance: Supervision Ambulation Distance (Feet): 25 Feet Assistive device: Straight cane Gait Pattern/deviations: Decreased stance time - left;Decreased step length - right;Antalgic;Drifts right/left   Gait velocity interpretation: at or above normal speed for age/gender General Gait Details: gait limited due to pain in L leg  Stairs            Wheelchair Mobility    Modified Rankin (Stroke Patients Only)       Balance Overall balance assessment: No apparent balance deficits (not formally assessed)                                           Pertinent Vitals/Pain Pain Assessment: 0-10 Pain Score: 7  Pain Descriptors / Indicators: Constant;Sharp Pain Intervention(s): Monitored during session;Limited activity within patient's tolerance    Home Living Family/patient expects to be discharged to:: Private residence Living Arrangements: Alone   Type of Home: House Home Access: Level entry     Home Layout: One level Home Equipment: [place] - single point;Other (comment) Additional Comments: shower chair    Prior Function Level of Independence: Independent               Hand Dominance  Extremity/Trunk Assessment               Lower Extremity Assessment: Overall WFL for tasks assessed (L leg slighlty weaker due to pain secondary to cellulitis)         Communication   Communication: No difficulties  Cognition Arousal/Alertness: Awake/alert Behavior During Therapy: WFL for tasks assessed/performed Overall Cognitive Status: Within Functional Limits for tasks assessed                      General Comments      Exercises        Assessment/Plan    PT  Assessment Patent does not need any further PT services  PT Diagnosis     PT Problem List    PT Treatment Interventions     PT Goals (Current goals can be found in the Care Plan section) Acute Rehab PT Goals Patient Stated Goal: go home, reduce pain L leg PT Goal Formulation: With patient    Frequency     Barriers to discharge        Co-evaluation               End of Session Equipment Utilized During Treatment: Gait belt Activity Tolerance: Patient tolerated treatment well Patient left: in chair;with call bell/phone within reach;with chair alarm set           Time: 0221-7981 PT Time Calculation (min) (ACUTE ONLY): 23 min   Charges:   PT Evaluation $Initial PT Evaluation Tier I: 1 Procedure     PT G CodesDeniece Ree E 01/07/2015, 1:45 PM

## 2015-01-08 ENCOUNTER — Other Ambulatory Visit: Payer: Self-pay | Admitting: Neurology

## 2015-01-08 LAB — CULTURE, BLOOD (ROUTINE X 2)
Culture: NO GROWTH
Culture: NO GROWTH

## 2015-01-08 LAB — BASIC METABOLIC PANEL
Anion gap: 5 (ref 5–15)
BUN: 20 mg/dL (ref 6–23)
CALCIUM: 8.6 mg/dL (ref 8.4–10.5)
CHLORIDE: 106 meq/L (ref 96–112)
CO2: 29 mmol/L (ref 19–32)
Creatinine, Ser: 1.31 mg/dL (ref 0.50–1.35)
GFR calc Af Amer: 57 mL/min — ABNORMAL LOW (ref >90)
GFR calc non Af Amer: 49 mL/min — ABNORMAL LOW (ref >90)
GLUCOSE: 109 mg/dL — AB (ref 70–99)
Potassium: 4.6 mmol/L (ref 3.5–5.1)
Sodium: 140 mmol/L (ref 135–145)

## 2015-01-08 LAB — PROTIME-INR
INR: 3.35 — AB (ref 0.00–1.49)
Prothrombin Time: 34.2 seconds — ABNORMAL HIGH (ref 11.6–15.2)

## 2015-01-08 MED ORDER — FUROSEMIDE 40 MG PO TABS: 40.0000 mg | ORAL_TABLET | Freq: Every day | ORAL | Status: DC

## 2015-01-08 MED ORDER — SPIRONOLACTONE 25 MG PO TABS: 12.5000 mg | ORAL_TABLET | Freq: Every day | ORAL | Status: DC

## 2015-01-08 MED ORDER — CLINDAMYCIN HCL 300 MG PO CAPS: 300.0000 mg | ORAL_CAPSULE | Freq: Three times a day (TID) | ORAL | Status: DC

## 2015-01-08 MED ORDER — DIGOXIN 125 MCG PO TABS: 0.1250 mg | ORAL_TABLET | Freq: Every day | ORAL | Status: DC

## 2015-01-08 MED ORDER — DSS 100 MG PO CAPS: 100.0000 mg | ORAL_CAPSULE | Freq: Every day | ORAL | Status: DC | PRN

## 2015-01-08 NOTE — Discharge Summary (Signed)
Physician Discharge Summary  [name] WJX:[id number] DOB: 1931/05/30 DOA: 01/03/2015  PCP: [name], MD  Admit date: 01/03/2015 Discharge date: 01/08/2015  Time spent:  minutes  Recommendations for Outpatient Follow-up:  1. Follow up with Dr Edrick Oh 01/13/15. Recommend BMET for evaluation of kidney function. Evaluate left leg cellulitis   Discharge Diagnoses:  Principal Problem:   Cellulitis of leg, left Active Problems:   Essential hypertension, benign   Chronic atrial fibrillation   Rheumatoid arthritis   Lupus anticoagulant positive   COPD (chronic obstructive pulmonary disease)   Long term current use of anticoagulant   Chronic diastolic heart failure   Cervical spondylosis without myelopathy   Acute kidney injury   Thrombocytopenia   Stage III chronic kidney disease   Discharge Condition: stable  Diet recommendation:heart healthy  Filed Weights   01/06/15 1700 01/07/15 0700 01/08/15 0540  Weight: 89.404 kg (197 lb 1.6 oz) 89.812 kg (198 lb) 91.037 kg (200 lb 11.2 oz)    History of present illness:  [name] is a 81 y.o. male  With a history of atrial fibrillation on anticoagulation with Coumadin, pulmonary embolism, lupus anticoagulant, GERD, COPD, chronic diastolic heart failure due to atrial fibrillation presented on 01/03/15 with a three-day history of worsening redness and swelling and pain in his left lower extremity involving approximately two thirds of his lower leg. The symptoms were becoming worse. Walking increased the pain. No other provoking  factors.  Hospital Course:  1. Left lower extremity cellulitis. Provided with IV antibiotics and gradually improved. Erythema receding and less heat and tenderness at discharge. Some dependant edema notes. Will discharge with 10 more days clinda.  He remained afebrile and non-toxic appearing.  No extremity venous Doppler of his leg as his INR was therapeutic on admission. Follow up with PCP  01/13/15  Chronic atrial fibrillation. INR 3.35 at discharge. (Patient has history of PE and multiple DVTs). Rate controlled. Will continue digoxin at lower dose and diltiazem with parameters. Dig Level slightly elevated at 2.5 so dig dose decreased.    History of DVT/PE/history of lupus anticoagulant. Therapeutic on admission.  INR 3.3 on day of discharge. Pharmacy recommends holding coumadin for 1 day and resuming home regimen on 01/09/15. OP follow up.   Hypertension.  BP 131-57 at discharge. Per outpatient note by his cardiologist Dr. Domenic Polite, the patient's blood pressure runs on the lower end of normal.  Chronic diastolic heart failure. remained compensated. His home lasix was held and Aldactone dose decreased. Will resume lasix and aldactone at lower doses. Recommend bmet and evaluation of volume status at follow up.     Acute kidney injury superimposed on stage III chronic kidney disease. Obtained records from the patient's PCP and it appears that his baseline creatinine is 1.4. It was 1.69 on admission. Creatinine at discharge 1.31. The patient was scheduled to see nephrology in the outpatient setting by his PCP.  COPD. remained stable.   Rheumatoid arthritis. Stable at baseline  Macrocytic anemia and thrombocytopenia. This is most likely secondary to methotrexate. His TSH and vitamin B12 level were within normal limits   Procedures:  none  Consultations:  none  Discharge Exam: Filed Vitals:   01/08/15 0904  BP:   Pulse: 72  Temp:   Resp:     General: well nourished appears comfortable Cardiovascular: irregularly irregular +murmur. LLE with continued receding erythema and less heat and tenderness. Some increased edema in dependant position Respiratory: normal effort BS clear bilaterally  Discharge Instructions  Current Discharge Medication List    START taking these medications   Details  clindamycin (CLEOCIN) 300 MG capsule Take 1 capsule (300 mg  total) by mouth 3 (three) times daily. Take for 10 days Qty: 30 capsule, Refills: 0    docusate sodium 100 MG CAPS Take 100 mg by mouth daily as needed for mild constipation. Qty: 10 capsule, Refills: 0      CONTINUE these medications which have CHANGED   Details  digoxin (LANOXIN) 0.125 MG tablet Take 1 tablet (0.125 mg total) by mouth daily. Qty: 30 tablet, Refills: 0    furosemide (LASIX) 40 MG tablet Take 1 tablet (40 mg total) by mouth daily. *Sometimes takes two tablets in the morning and one tablet in the evening as regimen depending on fluid retention Qty: 30 tablet, Refills: 0    spironolactone (ALDACTONE) 25 MG tablet Take 0.5 tablets (12.5 mg total) by mouth daily. Qty: 30 tablet, Refills: 0      CONTINUE these medications which have NOT CHANGED   Details  albuterol (PROAIR HFA) 108 (90 BASE) MCG/ACT inhaler Inhale 2 puffs into the lungs every 6 (six) hours as needed. Shortness of Breath    albuterol (PROVENTIL) (2.5 MG/3ML) 0.083% nebulizer solution Take 2.5 mg by nebulization every 6 (six) hours as needed. Shortness of Breath    cetirizine (ZYRTEC) 10 MG tablet Take 10 mg by mouth daily.      diltiazem (CARDIZEM CD) 300 MG 24 hr capsule Take 1 capsule (300 mg total) by mouth daily. Qty: 30 capsule, Refills: 11    Etanercept (ENBREL) 25 MG/0.5ML SOSY Inject into the skin 2 (two) times a week. Twice a week.    fluticasone (FLONASE) 50 MCG/ACT nasal spray Place 2 sprays into the nose as needed for allergies.     folic acid (FOLVITE) 1 MG tablet Take 1 mg by mouth daily.     gabapentin (NEURONTIN) 300 MG capsule TAKE ONE CAPSULE BY MOUTH EVERY MORNING AND TWO CAPSULES EVERY EVENING Qty: 90 capsule, Refills: 3    HYDROcodone-acetaminophen (NORCO) 10-325 MG per tablet Take 1 tablet by mouth 2 (two) times daily.    ketoconazole (NIZORAL) 2 % shampoo Apply 1 application topically 3 (three) times a week.     methocarbamol (ROBAXIN) 500 MG tablet Take 1 tablet by mouth 2  (two) times daily.    methotrexate (RHEUMATREX) 2.5 MG tablet Take 10 mg by mouth 2 (two) times a week. *TAKE 8 TABLETS BY MOUTH FOR A TOTAL 20MG  WEEKLY Patient takes 4 tablets (10Mg ) on Fridays and 4 tablets (10mg ) on Saturdays    mometasone (NASONEX) 50 MCG/ACT nasal spray Place 2 sprays into the nose daily.    Omega-3 Fatty Acids (FISH OIL) 1000 MG CAPS Take 2 capsules by mouth 2 (two) times daily.     omeprazole (PRILOSEC) 20 MG capsule Take 20 mg by mouth 2 (two) times daily.     oxyCODONE-acetaminophen (PERCOCET) 10-325 MG per tablet Take 1 tablet by mouth every 6 (six) hours as needed for pain.    potassium chloride SA (K-DUR,KLOR-CON) 20 MEQ tablet Take 20 mEq by mouth 2 (two) times daily.     VOLTAREN 1 % GEL Apply 2 g topically daily as needed (pain).     warfarin (COUMADIN) 2 MG tablet Take 6 mg by mouth See admin instructions. Takes 6 mg (3 tablets) daily except on Wednesdays doesn't take at all (MANAGED BY PMD)      STOP taking these medications  cloNIDine (CATAPRES) 0.1 MG tablet      clotrimazole-betamethasone (LOTRISONE) cream      hydrOXYzine (ATARAX/VISTARIL) 25 MG tablet      naproxen (NAPROSYN) 500 MG tablet      ondansetron (ZOFRAN ODT) 4 MG disintegrating tablet      predniSONE (DELTASONE) 10 MG tablet        Allergies  Allergen Reactions  . Cymbalta [Duloxetine Hcl] Other (See Comments)    Confusion   . Procaine Hcl Hives and Other (See Comments)    NOVOCAINE: Sweating, Confusion, Not in right state of mind.  [name]    unknown   Follow-up Information    Follow up with [name], MD On 01/13/2015.   Specialty:  Family Medicine   Why:  appointment at 3:30 with NP   Contact information:   [place] [address] [phone]        The results of significant diagnostics from this hospitalization (including imaging, microbiology, ancillary and laboratory) are listed below for  reference.    Significant Diagnostic Studies: No results found.  Microbiology: Recent Results (from the past 240 hour(s))  Blood culture (routine x 2)     Status: None   Collection Time: 01/03/15  9:27 PM  Result Value Ref Range Status   Specimen Description BLOOD LEFT ANTECUBITAL  Final   Special Requests BOTTLES DRAWN AEROBIC AND ANAEROBIC 8CC  Final   Culture NO GROWTH 5 DAYS  Final   Report Status 01/08/2015 FINAL  Final  Blood culture (routine x 2)     Status: None   Collection Time: 01/03/15  9:35 PM  Result Value Ref Range Status   Specimen Description BLOOD LEFT ANTECUBITAL  Final   Special Requests BOTTLES DRAWN AEROBIC AND ANAEROBIC 8CC  Final   Culture NO GROWTH 5 DAYS  Final   Report Status 01/08/2015 FINAL  Final  MRSA PCR Screening     Status: Abnormal   Collection Time: 01/03/15 11:45 PM  Result Value Ref Range Status   MRSA by PCR POSITIVE (A) NEGATIVE Final    Comment:        The GeneXpert MRSA Assay (FDA approved for NASAL specimens only), is one component of a comprehensive MRSA colonization surveillance program. It is not intended to diagnose MRSA infection nor to guide or monitor treatment for MRSA infections. RESULT CALLED TO, READ BACK BY AND VERIFIED WITH: RESULT CALLED TO, READ BACK BY AND VERIFIED WITH:  FOOTE,L @ 0330 ON 01/04/15 BY WOODIE,J      Labs: Basic Metabolic Panel:  Recent Labs Lab 01/04/15 0555 01/05/15 0550 01/06/15 0540 01/07/15 0530 01/08/15 0555  NA 139 139 139 138 140  K 4.0 4.1 4.3 4.4 4.6  CL 99 98 101 103 106  CO2 32 32 30 29 29   GLUCOSE 120* 117* 94 90 109*  BUN 25* 25* 31* 28* 20  CREATININE 1.57* 1.65* 1.78* 1.54* 1.31  CALCIUM 8.8 9.1 8.5 8.2* 8.6   Liver Function Tests:  Recent Labs Lab 01/03/15 2005 01/05/15 0550  AST 24 27  ALT 30 26  ALKPHOS 57 54  BILITOT 1.1 1.5*  PROT 7.7 7.4  ALBUMIN 4.1 3.8   No results for input(s): LIPASE, AMYLASE in the last 168 hours. No results for input(s):  AMMONIA in the last 168 hours. CBC:  Recent Labs Lab 01/03/15 2005 01/04/15 0555 01/05/15 0550 01/06/15 0540 01/07/15 0530  WBC 9.9 8.3 9.2 6.1 4.5  NEUTROABS 5.9  --   --   --   --  HGB 11.7* 10.9* 12.1* 11.1* 10.7*  HCT 35.6* 32.7* 36.5* 33.0* 31.4*  MCV 107.6* 107.2* 107.0* 106.8* 107.2*  PLT 81* 74* 124* 103* 113*   Cardiac Enzymes: No results for input(s): CKTOTAL, CKMB, CKMBINDEX, TROPONINI in the last 168 hours. BNP: BNP (last 3 results) No results for input(s): PROBNP in the last 8760 hours. CBG:  Recent Labs Lab 01/04/15 1649  GLUCAP 117*       Signed:  Debbe Crumble M  [facility] 01/08/2015, 11:13 AM

## 2015-01-08 NOTE — Plan of Care (Signed)
Problem: Discharge Progression Outcomes Goal: Discharge plan in place and appropriate Outcome: Completed/Met Date Met:  01/08/15 To home with family

## 2015-01-08 NOTE — Progress Notes (Signed)
[facility] for Coumadin Indication: atrial fibrillation, Hx VTE  Allergies  Allergen Reactions  . Cymbalta [Duloxetine Hcl] Other (See Comments)    Confusion   . Procaine Hcl Hives and Other (See Comments)    NOVOCAINE: Sweating, Confusion, Not in right state of mind.  [name]    unknown   Patient Measurements: Height: 5\' 8"  (172.7 cm) Weight: 200 lb 11.2 oz (91.037 kg) IBW/kg (Calculated) : 68.4  Vital Signs: Temp: 98.8 F (37.1 C) (01/14 0540) Temp Source: Oral (01/14 0540) BP: 131/57 mmHg (01/14 0540) Pulse Rate: 72 (01/14 0904)  Labs:  Recent Labs  01/06/15 0540 01/07/15 0530 01/08/15 0555  HGB 11.1* 10.7*  --   HCT 33.0* 31.4*  --   PLT 103* 113*  --   LABPROT 22.5* 32.0* 34.2*  INR 1.96* 3.08* 3.35*  CREATININE 1.78* 1.54* 1.31   Estimated Creatinine Clearance: 48.4 mL/min (by C-G formula based on Cr of 1.31).  Medical History: Past Medical History  Diagnosis Date  . COPD (chronic obstructive pulmonary disease)     Uses occasional nighttime O2  . Disseminated herpes zoster 2010  . GERD (gastroesophageal reflux disease)   . Asthma   . Pulmonary embolus     2003 and 2011  . Pulmonary nodules     Chest CT 09/11  . Mixed hyperlipidemia   . Atrial fibrillation     CHADS2 score 2  . Lupus anticoagulant positive   . Essential hypertension, benign   . Cholelithiasis   . Rheumatoid arthritis(714.0)   . History of chicken pox 1941; 2011  . Anemia   . Chronic lower back pain   . Chronic diastolic heart failure   . Cirrhosis     Questionable, AFP normal Feb 01/2012, hx of ETOH use   . Headache(784.0) 10/21/2013  . Cervical spondylosis without myelopathy 10/21/2013  . Stage III chronic kidney disease 01/05/2015    Baseline creatinine 1. for 5  . Cellulitis of leg, left 01/03/2015   Medications:  Medications Prior to Admission  Medication Sig Dispense Refill  . albuterol (PROAIR  HFA) 108 (90 BASE) MCG/ACT inhaler Inhale 2 puffs into the lungs every 6 (six) hours as needed. Shortness of Breath    . albuterol (PROVENTIL) (2.5 MG/3ML) 0.083% nebulizer solution Take 2.5 mg by nebulization every 6 (six) hours as needed. Shortness of Breath    . cetirizine (ZYRTEC) 10 MG tablet Take 10 mg by mouth daily.      . [EXPIRED] cloNIDine (CATAPRES) 0.1 MG tablet Take 1 tablet (0.1 mg total) by mouth daily. For 1 week; then, STOP CLONIDINE 7 tablet 0  . clotrimazole-betamethasone (LOTRISONE) cream Apply 1 application topically daily.     . digoxin (DIGOX) 0.25 MG tablet Take 1 tablet (250 mcg total) by mouth daily. (Patient taking differently: Take 250 mcg by mouth every morning. ) 30 tablet 11  . diltiazem (CARDIZEM CD) 300 MG 24 hr capsule Take 1 capsule (300 mg total) by mouth daily. (Patient taking differently: Take 300 mg by mouth every evening. ) 30 capsule 11  . Etanercept (ENBREL) 25 MG/0.5ML SOSY Inject into the skin 2 (two) times a week. Twice a week.    . fluticasone (FLONASE) 50 MCG/ACT nasal spray Place 2 sprays into the nose as needed for allergies.     . folic acid (FOLVITE) 1 MG tablet Take 1 mg by mouth daily.     . furosemide (LASIX) 40 MG tablet  Take 40 mg by mouth 2 (two) times daily. *Sometimes takes two tablets in the morning and one tablet in the evening as regimen depending on fluid retention    . gabapentin (NEURONTIN) 300 MG capsule TAKE ONE CAPSULE BY MOUTH EVERY MORNING AND TWO CAPSULES EVERY EVENING 90 capsule 3  . HYDROcodone-acetaminophen (NORCO) 10-325 MG per tablet Take 1 tablet by mouth 2 (two) times daily.    Marland Kitchen ketoconazole (NIZORAL) 2 % shampoo Apply 1 application topically 3 (three) times a week.     . methocarbamol (ROBAXIN) 500 MG tablet Take 1 tablet by mouth 2 (two) times daily.    . methotrexate (RHEUMATREX) 2.5 MG tablet Take 10 mg by mouth 2 (two) times a week. *TAKE 8 TABLETS BY MOUTH FOR A TOTAL 20MG  WEEKLY Patient takes 4 tablets (10Mg ) on  Fridays and 4 tablets (10mg ) on Saturdays    . mometasone (NASONEX) 50 MCG/ACT nasal spray Place 2 sprays into the nose daily.    . Omega-3 Fatty Acids (FISH OIL) 1000 MG CAPS Take 2 capsules by mouth 2 (two) times daily.     Marland Kitchen omeprazole (PRILOSEC) 20 MG capsule Take 20 mg by mouth 2 (two) times daily.     Marland Kitchen oxyCODONE-acetaminophen (PERCOCET) 10-325 MG per tablet Take 1 tablet by mouth every 6 (six) hours as needed for pain.    . potassium chloride SA (K-DUR,KLOR-CON) 20 MEQ tablet Take 20 mEq by mouth 2 (two) times daily.     Marland Kitchen spironolactone (ALDACTONE) 25 MG tablet Take 25 mg by mouth daily.    . VOLTAREN 1 % GEL Apply 2 g topically daily as needed (pain).     Marland Kitchen warfarin (COUMADIN) 2 MG tablet Take 6 mg by mouth See admin instructions. Takes 6 mg (3 tablets) daily except on Wednesdays doesn't take at all (MANAGED BY PMD)    . hydrOXYzine (ATARAX/VISTARIL) 25 MG tablet 1 at hs, or q6h prn itching. (Patient not taking: Reported on 01/03/2015) 12 tablet 0  . naproxen (NAPROSYN) 500 MG tablet Take 1 tablet (500 mg total) by mouth 2 (two) times daily with a meal. (Patient not taking: Reported on 01/03/2015) 30 tablet 0  . ondansetron (ZOFRAN ODT) 4 MG disintegrating tablet Take 1 tablet (4 mg total) by mouth every 8 (eight) hours as needed for nausea. (Patient not taking: Reported on 01/03/2015) 12 tablet 0  . predniSONE (DELTASONE) 10 MG tablet 5,4,3,2,1 - take with food (Patient not taking: Reported on 01/03/2015) 15 tablet 0   Assessment: 81 yo M on chronic Coumadin for Afib and +Lupus AC with history of multiple PEs.  Home dose listed above.  INR was therapeutic on admission, but then trended below goal range on home dose and now supra-therapeutic after receiving increased warfarin doses..   CBC reviewed.  Platelet count has been <100K this admission, but is now slightly better.  No bleeding noted.    Goal of Therapy:  INR 2-3   Plan:   Hold Coumadin today  Recommend resume previous home  Coumadin regimen at discharge with follow-up INR monitoring next week.  [name] 01/08/2015,10:52 AM

## 2015-01-09 NOTE — Progress Notes (Signed)
UR chart review completed.  

## 2015-02-03 ENCOUNTER — Other Ambulatory Visit: Payer: Self-pay | Admitting: Neurology

## 2015-03-11 ENCOUNTER — Other Ambulatory Visit: Payer: Self-pay | Admitting: *Deleted

## 2015-03-11 MED ORDER — DIGOXIN 125 MCG PO TABS: 0.1250 mg | ORAL_TABLET | Freq: Every day | ORAL | Status: DC

## 2015-05-19 ENCOUNTER — Inpatient Hospital Stay (HOSPITAL_COMMUNITY)
Admission: EM | Admit: 2015-05-19 | Discharge: 2015-05-25 | DRG: 190 | Disposition: A | Discharge status: Home / Self Care | Payer: Medicare Other | Attending: Internal Medicine | Admitting: Internal Medicine

## 2015-05-19 ENCOUNTER — Encounter (HOSPITAL_COMMUNITY): Payer: Self-pay | Admitting: Emergency Medicine

## 2015-05-19 ENCOUNTER — Emergency Department (HOSPITAL_COMMUNITY): Payer: Medicare Other

## 2015-05-19 DIAGNOSIS — Z7901 Long term (current) use of anticoagulants: Secondary | ICD-10-CM | POA: Diagnosis not present

## 2015-05-19 DIAGNOSIS — M069 Rheumatoid arthritis, unspecified: Secondary | ICD-10-CM | POA: Diagnosis present

## 2015-05-19 DIAGNOSIS — I5031 Acute diastolic (congestive) heart failure: Secondary | ICD-10-CM | POA: Insufficient documentation

## 2015-05-19 DIAGNOSIS — E861 Hypovolemia: Secondary | ICD-10-CM | POA: Diagnosis present

## 2015-05-19 DIAGNOSIS — E782 Mixed hyperlipidemia: Secondary | ICD-10-CM | POA: Diagnosis present

## 2015-05-19 DIAGNOSIS — K7460 Unspecified cirrhosis of liver: Secondary | ICD-10-CM | POA: Diagnosis present

## 2015-05-19 DIAGNOSIS — I129 Hypertensive chronic kidney disease with stage 1 through stage 4 chronic kidney disease, or unspecified chronic kidney disease: Secondary | ICD-10-CM | POA: Diagnosis present

## 2015-05-19 DIAGNOSIS — J441 Chronic obstructive pulmonary disease with (acute) exacerbation: Principal | ICD-10-CM | POA: Diagnosis present

## 2015-05-19 DIAGNOSIS — I350 Nonrheumatic aortic (valve) stenosis: Secondary | ICD-10-CM | POA: Diagnosis present

## 2015-05-19 DIAGNOSIS — R05 Cough: Secondary | ICD-10-CM | POA: Diagnosis present

## 2015-05-19 DIAGNOSIS — Z23 Encounter for immunization: Secondary | ICD-10-CM | POA: Diagnosis not present

## 2015-05-19 DIAGNOSIS — N179 Acute kidney failure, unspecified: Secondary | ICD-10-CM | POA: Diagnosis not present

## 2015-05-19 DIAGNOSIS — Z86711 Personal history of pulmonary embolism: Secondary | ICD-10-CM

## 2015-05-19 DIAGNOSIS — I5033 Acute on chronic diastolic (congestive) heart failure: Secondary | ICD-10-CM | POA: Diagnosis present

## 2015-05-19 DIAGNOSIS — I482 Chronic atrial fibrillation, unspecified: Secondary | ICD-10-CM | POA: Diagnosis present

## 2015-05-19 DIAGNOSIS — D696 Thrombocytopenia, unspecified: Secondary | ICD-10-CM | POA: Diagnosis present

## 2015-05-19 DIAGNOSIS — I10 Essential (primary) hypertension: Secondary | ICD-10-CM | POA: Diagnosis present

## 2015-05-19 DIAGNOSIS — E875 Hyperkalemia: Secondary | ICD-10-CM | POA: Diagnosis present

## 2015-05-19 DIAGNOSIS — Z87891 Personal history of nicotine dependence: Secondary | ICD-10-CM

## 2015-05-19 DIAGNOSIS — E871 Hypo-osmolality and hyponatremia: Secondary | ICD-10-CM | POA: Diagnosis present

## 2015-05-19 DIAGNOSIS — J45909 Unspecified asthma, uncomplicated: Secondary | ICD-10-CM | POA: Diagnosis present

## 2015-05-19 DIAGNOSIS — R059 Cough, unspecified: Secondary | ICD-10-CM

## 2015-05-19 DIAGNOSIS — K219 Gastro-esophageal reflux disease without esophagitis: Secondary | ICD-10-CM | POA: Diagnosis present

## 2015-05-19 DIAGNOSIS — N183 Chronic kidney disease, stage 3 (moderate): Secondary | ICD-10-CM | POA: Diagnosis present

## 2015-05-19 DIAGNOSIS — I509 Heart failure, unspecified: Secondary | ICD-10-CM | POA: Diagnosis not present

## 2015-05-19 HISTORY — DX: Chronic atrial fibrillation, unspecified: I48.20

## 2015-05-19 HISTORY — DX: Nonrheumatic aortic (valve) stenosis: I35.0

## 2015-05-19 LAB — COMPREHENSIVE METABOLIC PANEL
ALK PHOS: 48 U/L (ref 38–126)
ALT: 14 U/L — AB (ref 17–63)
ANION GAP: 9 (ref 5–15)
AST: 23 U/L (ref 15–41)
Albumin: 3.6 g/dL (ref 3.5–5.0)
BUN: 18 mg/dL (ref 6–20)
CALCIUM: 8.9 mg/dL (ref 8.9–10.3)
CO2: 27 mmol/L (ref 22–32)
Chloride: 103 mmol/L (ref 101–111)
Creatinine, Ser: 1.09 mg/dL (ref 0.61–1.24)
GFR calc Af Amer: >60 mL/min (ref >60)
Glucose, Bld: 100 mg/dL — ABNORMAL HIGH (ref 65–99)
Potassium: 3.9 mmol/L (ref 3.5–5.1)
Sodium: 139 mmol/L (ref 135–145)
Total Bilirubin: 3.3 mg/dL — ABNORMAL HIGH (ref 0.3–1.2)
Total Protein: 8.2 g/dL — ABNORMAL HIGH (ref 6.5–8.1)

## 2015-05-19 LAB — TSH: TSH: 1.001 u[IU]/mL (ref 0.350–4.500)

## 2015-05-19 LAB — LACTIC ACID, PLASMA
Lactic Acid, Venous: 1.5 mmol/L (ref 0.5–2.0)
Lactic Acid, Venous: 1.5 mmol/L (ref 0.5–2.0)

## 2015-05-19 LAB — CBC
HCT: 38.7 % — ABNORMAL LOW (ref 39.0–52.0)
Hemoglobin: 12.7 g/dL — ABNORMAL LOW (ref 13.0–17.0)
MCH: 32.3 pg (ref 26.0–34.0)
MCHC: 32.8 g/dL (ref 30.0–36.0)
MCV: 98.5 fL (ref 78.0–100.0)
PLATELETS: 104 10*3/uL — AB (ref 150–400)
RBC: 3.93 MIL/uL — ABNORMAL LOW (ref 4.22–5.81)
RDW: 14.7 % (ref 11.5–15.5)
WBC: 11.2 10*3/uL — ABNORMAL HIGH (ref 4.0–10.5)

## 2015-05-19 LAB — DIGOXIN LEVEL

## 2015-05-19 LAB — TROPONIN I

## 2015-05-19 LAB — PROTIME-INR
INR: 1.68 — ABNORMAL HIGH (ref 0.00–1.49)
Prothrombin Time: 19.8 seconds — ABNORMAL HIGH (ref 11.6–15.2)

## 2015-05-19 LAB — BRAIN NATRIURETIC PEPTIDE: B Natriuretic Peptide: 413 pg/mL — ABNORMAL HIGH (ref 0.0–100.0)

## 2015-05-19 MED ORDER — ONDANSETRON HCL 4 MG/2ML IJ SOLN: 4.0000 mg | Freq: Four times a day (QID) | INTRAMUSCULAR | Status: DC | PRN

## 2015-05-19 MED ORDER — OXYCODONE HCL 5 MG PO TABS
5.0000 mg | ORAL_TABLET | Freq: Four times a day (QID) | ORAL | Status: DC | PRN
  Administered 2015-05-20 – 2015-05-25 (×15): 5 mg via ORAL
  Filled 2015-05-19 (×15): qty 1

## 2015-05-19 MED ORDER — SODIUM CHLORIDE 0.9 % IJ SOLN
3.0000 mL | Freq: Two times a day (BID) | INTRAMUSCULAR | Status: DC
  Administered 2015-05-19 – 2015-05-24 (×8): 3 mL via INTRAVENOUS

## 2015-05-19 MED ORDER — DILTIAZEM HCL ER BEADS 300 MG PO CP24
300.0000 mg | ORAL_CAPSULE | Freq: Every day | ORAL | Status: DC
  Administered 2015-05-20 – 2015-05-21 (×2): 300 mg via ORAL
  Filled 2015-05-19 (×3): qty 1

## 2015-05-19 MED ORDER — FOLIC ACID 1 MG PO TABS
1.0000 mg | ORAL_TABLET | Freq: Every day | ORAL | Status: DC
  Administered 2015-05-20 – 2015-05-25 (×6): 1 mg via ORAL
  Filled 2015-05-19 (×6): qty 1

## 2015-05-19 MED ORDER — FUROSEMIDE 10 MG/ML IJ SOLN: 40.0000 mg | Freq: Every day | INTRAMUSCULAR | Status: DC

## 2015-05-19 MED ORDER — TADALAFIL 5 MG PO TABS: 5.0000 mg | ORAL_TABLET | Freq: Every day | ORAL | Status: DC

## 2015-05-19 MED ORDER — FOLIC ACID 1 MG PO TABS: 1.0000 mg | ORAL_TABLET | Freq: Every day | ORAL | Status: DC

## 2015-05-19 MED ORDER — METHOTREXATE 2.5 MG PO TABS
7.5000 mg | ORAL_TABLET | ORAL | Status: DC
  Filled 2015-05-19: qty 3

## 2015-05-19 MED ORDER — LORATADINE 10 MG PO TABS
10.0000 mg | ORAL_TABLET | Freq: Every day | ORAL | Status: DC
  Administered 2015-05-19 – 2015-05-25 (×7): 10 mg via ORAL
  Filled 2015-05-19 (×7): qty 1

## 2015-05-19 MED ORDER — OXYCODONE-ACETAMINOPHEN 5-325 MG PO TABS
1.0000 | ORAL_TABLET | Freq: Four times a day (QID) | ORAL | Status: DC | PRN
  Administered 2015-05-20 – 2015-05-25 (×18): 1 via ORAL
  Filled 2015-05-19 (×18): qty 1

## 2015-05-19 MED ORDER — WARFARIN - PHARMACIST DOSING INPATIENT
Status: DC
  Administered 2015-05-20: 18:00:00

## 2015-05-19 MED ORDER — LEVOFLOXACIN IN D5W 500 MG/100ML IV SOLN
500.0000 mg | INTRAVENOUS | Status: DC
  Administered 2015-05-19: 500 mg via INTRAVENOUS

## 2015-05-19 MED ORDER — OXYCODONE-ACETAMINOPHEN 10-325 MG PO TABS: 1.0000 | ORAL_TABLET | Freq: Four times a day (QID) | ORAL | Status: DC | PRN

## 2015-05-19 MED ORDER — GABAPENTIN 300 MG PO CAPS
600.0000 mg | ORAL_CAPSULE | Freq: Two times a day (BID) | ORAL | Status: DC
  Administered 2015-05-19 – 2015-05-25 (×12): 600 mg via ORAL
  Filled 2015-05-19 (×12): qty 2

## 2015-05-19 MED ORDER — FUROSEMIDE 10 MG/ML IJ SOLN
40.0000 mg | Freq: Once | INTRAMUSCULAR | Status: AC
  Administered 2015-05-19: 40 mg via INTRAVENOUS
  Filled 2015-05-19: qty 4

## 2015-05-19 MED ORDER — DICLOFENAC SODIUM 1 % TD GEL
2.0000 g | Freq: Every day | TRANSDERMAL | Status: DC | PRN
Start: 2015-05-19 — End: 2015-05-25
  Administered 2015-05-23 – 2015-05-24 (×2): 2 g via TOPICAL
  Filled 2015-05-19: qty 100

## 2015-05-19 MED ORDER — WARFARIN SODIUM 5 MG PO TABS: 5.0000 mg | ORAL_TABLET | ORAL | Status: DC

## 2015-05-19 MED ORDER — SODIUM CHLORIDE 0.9 % IV SOLN
INTRAVENOUS | Status: DC
  Administered 2015-05-19: 10 mL/h via INTRAVENOUS
  Administered 2015-05-19: 22:00:00 via INTRAVENOUS

## 2015-05-19 MED ORDER — WARFARIN SODIUM 5 MG PO TABS
5.0000 mg | ORAL_TABLET | Freq: Once | ORAL | Status: AC
  Administered 2015-05-19: 5 mg via ORAL
  Filled 2015-05-19: qty 1

## 2015-05-19 MED ORDER — POTASSIUM CHLORIDE CRYS ER 20 MEQ PO TBCR
20.0000 meq | EXTENDED_RELEASE_TABLET | Freq: Two times a day (BID) | ORAL | Status: DC
  Administered 2015-05-19 – 2015-05-22 (×7): 20 meq via ORAL
  Filled 2015-05-19 (×7): qty 1

## 2015-05-19 MED ORDER — LEVOFLOXACIN IN D5W 500 MG/100ML IV SOLN
INTRAVENOUS | Status: AC
  Filled 2015-05-19: qty 100

## 2015-05-19 MED ORDER — ALBUTEROL SULFATE (2.5 MG/3ML) 0.083% IN NEBU: 2.5000 mg | INHALATION_SOLUTION | Freq: Four times a day (QID) | RESPIRATORY_TRACT | Status: DC | PRN

## 2015-05-19 MED ORDER — DIGOXIN 125 MCG PO TABS
0.1250 mg | ORAL_TABLET | Freq: Every day | ORAL | Status: DC
  Administered 2015-05-20 – 2015-05-24 (×5): 0.125 mg via ORAL
  Filled 2015-05-19 (×5): qty 1

## 2015-05-19 MED ORDER — ONDANSETRON HCL 4 MG PO TABS: 4.0000 mg | ORAL_TABLET | Freq: Four times a day (QID) | ORAL | Status: DC | PRN

## 2015-05-19 MED ORDER — OMEGA-3-ACID ETHYL ESTERS 1 G PO CAPS
1.0000 g | ORAL_CAPSULE | Freq: Every day | ORAL | Status: DC
  Administered 2015-05-20 – 2015-05-25 (×6): 1 g via ORAL
  Filled 2015-05-19 (×6): qty 1

## 2015-05-19 MED ORDER — ETANERCEPT 25 MG/0.5ML ~~LOC~~ SOSY
25.0000 mg | PREFILLED_SYRINGE | SUBCUTANEOUS | Status: DC
  Filled 2015-05-19: qty 1

## 2015-05-19 MED ORDER — PANTOPRAZOLE SODIUM 40 MG PO TBEC
80.0000 mg | DELAYED_RELEASE_TABLET | Freq: Every day | ORAL | Status: DC
  Administered 2015-05-20 – 2015-05-25 (×6): 80 mg via ORAL
  Filled 2015-05-19 (×6): qty 2

## 2015-05-19 MED ORDER — ALBUTEROL SULFATE HFA 108 (90 BASE) MCG/ACT IN AERS
2.0000 | INHALATION_SPRAY | Freq: Four times a day (QID) | RESPIRATORY_TRACT | Status: DC | PRN
  Filled 2015-05-19: qty 6.7

## 2015-05-19 MED ORDER — FERROUS SULFATE 325 (65 FE) MG PO TABS
325.0000 mg | ORAL_TABLET | Freq: Every day | ORAL | Status: DC
  Administered 2015-05-20 – 2015-05-25 (×6): 325 mg via ORAL
  Filled 2015-05-19 (×6): qty 1

## 2015-05-19 MED ORDER — FLUTICASONE PROPIONATE 50 MCG/ACT NA SUSP
2.0000 | Freq: Every day | NASAL | Status: DC
  Administered 2015-05-20 – 2015-05-25 (×6): 2 via NASAL
  Filled 2015-05-19 (×2): qty 16

## 2015-05-19 MED ORDER — ACETAMINOPHEN 650 MG RE SUPP
650.0000 mg | Freq: Four times a day (QID) | RECTAL | Status: DC | PRN
  Administered 2015-05-19: 650 mg via RECTAL
  Filled 2015-05-19: qty 1

## 2015-05-19 MED ORDER — KETOCONAZOLE 2 % EX SHAM
1.0000 "application " | MEDICATED_SHAMPOO | CUTANEOUS | Status: DC
  Administered 2015-05-22: 1 via TOPICAL
  Filled 2015-05-19: qty 120

## 2015-05-19 MED ORDER — PANTOPRAZOLE SODIUM 40 MG PO TBEC: 40.0000 mg | DELAYED_RELEASE_TABLET | Freq: Every day | ORAL | Status: DC

## 2015-05-19 MED ORDER — VITAMIN D (ERGOCALCIFEROL) 1.25 MG (50000 UNIT) PO CAPS
50000.0000 [IU] | ORAL_CAPSULE | ORAL | Status: DC
  Administered 2015-05-20: 50000 [IU] via ORAL
  Filled 2015-05-19 (×2): qty 1

## 2015-05-19 NOTE — Progress Notes (Addendum)
ANTICOAGULATION CONSULT NOTE - Initial Consult  Pharmacy Consult for Warfarin Indication: atrial fibrillation  Allergies  Allergen Reactions  . Cymbalta [Duloxetine Hcl] Other (See Comments)    Confusion   . Procaine Hcl Hives and Other (See Comments)    NOVOCAINE: Sweating, Confusion, Not in right state of mind.  [name]    unknown    Patient Measurements: Height: 5\' 8"  (172.7 cm) Weight: 195 lb 14.4 oz (88.86 kg) IBW/kg (Calculated) : 68.4  Vital Signs: Temp: 102.4 F (39.1 C) (05/24 1944) Temp Source: Oral (05/24 1944) BP: 132/74 mmHg (05/24 1944) Pulse Rate: 121 (05/24 1944)  Labs:  Recent Labs  05/19/15 1545  HGB 12.7*  HCT 38.7*  PLT 104*  LABPROT 19.8*  INR 1.68*  CREATININE 1.09  TROPONINI <0.03    Estimated Creatinine Clearance: 57.6 mL/min (by C-G formula based on Cr of 1.09).   Medical History: Past Medical History  Diagnosis Date  . COPD (chronic obstructive pulmonary disease)     Uses occasional nighttime O2  . Disseminated herpes zoster 2010  . GERD (gastroesophageal reflux disease)   . Asthma   . Pulmonary embolus     2003 and 2011  . Pulmonary nodules     Chest CT 09/11  . Mixed hyperlipidemia   . Atrial fibrillation     CHADS2 score 2  . Lupus anticoagulant positive   . Essential hypertension, benign   . Cholelithiasis   . Rheumatoid arthritis(714.0)   . History of chicken pox 1941; 2011  . Anemia   . Chronic lower back pain   . Chronic diastolic heart failure   . Cirrhosis     Questionable, AFP normal Feb 01/2012, hx of ETOH use   . Headache(784.0) 10/21/2013  . Cervical spondylosis without myelopathy 10/21/2013  . Stage III chronic kidney disease 01/05/2015    Baseline creatinine 1. for 5  . Cellulitis of leg, left 01/03/2015    Medications:  Prescriptions prior to admission  Medication Sig Dispense Refill Last Dose  . albuterol (PROAIR HFA) 108 (90 BASE) MCG/ACT inhaler Inhale 2  puffs into the lungs every 6 (six) hours as needed. Shortness of Breath   05/18/2015 at Unknown time  . albuterol (PROVENTIL) (2.5 MG/3ML) 0.083% nebulizer solution Take 2.5 mg by nebulization every 6 (six) hours as needed. Shortness of Breath   Past Month at Unknown time  . cetirizine (ZYRTEC) 10 MG tablet Take 10 mg by mouth daily.     05/18/2015 at Unknown time  . digoxin (LANOXIN) 0.125 MG tablet Take 0.125 mg by mouth daily.   05/19/2015 at Unknown time  . diltiazem (CARDIZEM CD) 300 MG 24 hr capsule Take 1 capsule (300 mg total) by mouth daily. (Patient taking differently: Take 300 mg by mouth every evening. ) 30 capsule 11 05/18/2015 at Unknown time  . Etanercept (ENBREL) 25 MG/0.5ML SOSY Inject into the skin 2 (two) times a week.    05/15/2015  . ferrous sulfate 325 (65 FE) MG tablet Take 325 mg by mouth daily.   05/19/2015 at Unknown time  . folic acid (FOLVITE) 1 MG tablet Take 1 mg by mouth daily.    05/19/2015 at Unknown time  . gabapentin (NEURONTIN) 300 MG capsule TAKE ONE CAPSULE BY MOUTH EVERY MORNING AND TWO CAPSULES IN THE EVENING (Patient taking differently: TAKE TWO CAPSULES BY MOUTH TWICE DAILY) 30 capsule 0 05/19/2015 at Unknown time  . HYDROcodone-acetaminophen (NORCO) 10-325 MG per tablet Take 1 tablet by  mouth 2 (two) times daily. FOR HEADACHE PAIN   05/19/2015 at Unknown time  . ketoconazole (NIZORAL) 2 % shampoo Apply 1 application topically 3 (three) times a week.    Past Week at Unknown time  . methotrexate (RHEUMATREX) 2.5 MG tablet Take 7.5 mg by mouth 2 (two) times a week. *TAKE 3 TABLETS BY MOUTH TWICE WEEKLY ON FRIDAYS AND SATURDAYS*   05/16/2015  . mometasone (NASONEX) 50 MCG/ACT nasal spray Place 2 sprays into the nose daily.   05/19/2015 at Unknown time  . omeprazole (PRILOSEC) 40 MG capsule Take 40 mg by mouth daily.   05/19/2015 at Unknown time  . oxyCODONE-acetaminophen (PERCOCET) 10-325 MG per tablet Take 1 tablet by mouth every 6 (six) hours as needed for pain.   05/19/2015  at Unknown time  . potassium chloride SA (K-DUR,KLOR-CON) 20 MEQ tablet Take 20 mEq by mouth 2 (two) times daily.    05/19/2015 at Unknown time  . tadalafil (CIALIS) 5 MG tablet Take 5 mg by mouth daily.   UNKNOWN  . Vitamin D, Ergocalciferol, (DRISDOL) 50000 UNITS CAPS capsule Take 1 capsule by mouth once a week.  4 Past Week at Unknown time  . warfarin (COUMADIN) 2 MG tablet Take 5 mg by mouth See admin instructions. No medication on Wednesday. 5 on al other days   05/18/2015 at 2000  . docusate sodium 100 MG CAPS Take 100 mg by mouth daily as needed for mild constipation. (Patient not taking: Reported on 05/19/2015) 10 capsule 0   . furosemide (LASIX) 40 MG tablet Take 1 tablet (40 mg total) by mouth daily. *Sometimes takes two tablets in the morning and one tablet in the evening as regimen depending on fluid retention (Patient not taking: Reported on 05/19/2015) 30 tablet 0   . Omega-3 Fatty Acids (FISH OIL) 1000 MG CAPS Take 2 capsules by mouth 2 (two) times daily.    Not Taking at Unknown time  . spironolactone (ALDACTONE) 25 MG tablet Take 0.5 tablets (12.5 mg total) by mouth daily. (Patient not taking: Reported on 05/19/2015) 30 tablet 0     Assessment: Okay for Protocol, INR below goal.  Last dose on 5/23 per home med list.  Active orders for Levaquin and Methotrexate which can both elevate INR response.  Goal of Therapy:  INR 2-3   Plan:  Warfarin 5mg  PO x 1 Daily PT/INR  Pricilla Larsson 05/19/2015,8:23 PM

## 2015-05-19 NOTE — ED Provider Notes (Addendum)
CSN: [id number]     Arrival date & time 05/19/15  1304 History   First MD Initiated Contact with Patient 05/19/15 1528     Chief Complaint  Patient presents with  . Cough  . Shortness of Breath     (Consider location/radiation/quality/duration/timing/severity/associated sxs/prior Treatment) HPI Patient w hx copd, afib, c/o sob and non productive cough for the past 3 days. Sob persistent, moderate. States compliant w normal meds. States felt tight in chest, but no episodic or exertional chest pain. +increased bilateral lower extremity edema. No orthopnea or pnd. Denies sore throat, runny nose, body aches or other uri c/o. Denies fever, states temp at pcp office yesterday 100. Denies chills or sweats.       Past Medical History  Diagnosis Date  . COPD (chronic obstructive pulmonary disease)     Uses occasional nighttime O2  . Disseminated herpes zoster 2010  . GERD (gastroesophageal reflux disease)   . Asthma   . Pulmonary embolus     2003 and 2011  . Pulmonary nodules     Chest CT 09/11  . Mixed hyperlipidemia   . Atrial fibrillation     CHADS2 score 2  . Lupus anticoagulant positive   . Essential hypertension, benign   . Cholelithiasis   . Rheumatoid arthritis(714.0)   . History of chicken pox 1941; 2011  . Anemia   . Chronic lower back pain   . Chronic diastolic heart failure   . Cirrhosis     Questionable, AFP normal Feb 01/2012, hx of ETOH use   . Headache(784.0) 10/21/2013  . Cervical spondylosis without myelopathy 10/21/2013  . Stage III chronic kidney disease 01/05/2015    Baseline creatinine 1. for 5  . Cellulitis of leg, left 01/03/2015   Past Surgical History  Procedure Laterality Date  . Polypectomy  02/02/2012    RMR: Multiple colonic polyps removed, flat, tubular adenomas/ Left-sided diverticulosis  . Posterior lumbar vertebrae excision  2003; 2009; 2011  . Myringotomy      "3 times; both ears"  . Cataract extraction w/ intraocular lens  implant, bilateral     . Cholecystectomy  05/23/2012    Procedure: LAPAROSCOPIC CHOLECYSTECTOMY WITH INTRAOPERATIVE CHOLANGIOGRAM;  Surgeon: Stark Klein, MD;  Location: [place];  Service: General;  Laterality: N/A;  . Esophagogastroduodenoscopy  02/02/12    WUJ:[id number] size hiatal hernia; otherwise normal exam  . Colonoscopy  01/24/2013    Procedure: COLONOSCOPY;  Surgeon: Daneil Dolin, MD;  Location: [facility];  Service: Endoscopy;  Laterality: N/A;  8:30  . Back surgery     Family History  Problem Relation Age of Onset  . Colon cancer Neg Hx   . Liver disease Neg Hx    History  Substance Use Topics  . Smoking status: Former Smoker -- 1.00 packs/day for 57 years    Types: Cigarettes    Quit date: 01/27/1998  . Smokeless tobacco: Never Used  . Alcohol Use: No     Comment: "quit drinking 1976"    Review of Systems  Constitutional: Negative for fever and chills.  HENT: Negative for sore throat.   Eyes: Negative for redness.  Respiratory: Positive for cough and shortness of breath.   Cardiovascular: Positive for chest pain and leg swelling.  Gastrointestinal: Negative for nausea, vomiting and abdominal pain.  Endocrine: Negative for polyuria.  Genitourinary: Negative for flank pain.  Musculoskeletal: Negative for back pain and neck pain.  Skin: Negative for rash.  Neurological: Negative for headaches.  Hematological: Does  not bruise/bleed easily.  Psychiatric/Behavioral: Negative for confusion.      Allergies  Cymbalta; Procaine hcl; and Bystolic  Home Medications   Prior to Admission medications   Medication Sig Start Date End Date Taking? Authorizing Provider  albuterol (PROAIR HFA) 108 (90 BASE) MCG/ACT inhaler Inhale 2 puffs into the lungs every 6 (six) hours as needed. Shortness of Breath   Yes Historical Provider, MD  albuterol (PROVENTIL) (2.5 MG/3ML) 0.083% nebulizer solution Take 2.5 mg by nebulization every 6 (six) hours as needed. Shortness of Breath   Yes Historical Provider,  MD  cetirizine (ZYRTEC) 10 MG tablet Take 10 mg by mouth daily.     Yes Historical Provider, MD  Etanercept (ENBREL) 25 MG/0.5ML SOSY Inject into the skin 2 (two) times a week.    Yes Historical Provider, MD  fluticasone (FLONASE) 50 MCG/ACT nasal spray Place 2 sprays into the nose as needed for allergies.  07/02/13  Yes Historical Provider, MD  gabapentin (NEURONTIN) 300 MG capsule TAKE ONE CAPSULE BY MOUTH EVERY MORNING AND TWO CAPSULES IN THE EVENING Patient taking differently: 2 TWICE DAILY 02/03/15  Yes Kathrynn Ducking, MD  HYDROcodone-acetaminophen [facility]) 10-325 MG per tablet Take 1 tablet by mouth 2 (two) times daily. 09/03/13  Yes Historical Provider, MD  ketoconazole (NIZORAL) 2 % shampoo Apply 1 application topically 3 (three) times a week.  05/21/13  Yes Historical Provider, MD  Omega-3 Fatty Acids (FISH OIL) 1000 MG CAPS Take 2 capsules by mouth 2 (two) times daily.    Yes Historical Provider, MD  oxyCODONE-acetaminophen (PERCOCET) 10-325 MG per tablet Take 1 tablet by mouth every 6 (six) hours as needed for pain. 05/25/12  Yes Verlee Monte, MD  warfarin (COUMADIN) 2 MG tablet Take 5 mg by mouth See admin instructions. No medication on Wednesday. 5 on al other days   Yes Historical Provider, MD  diltiazem (CARDIZEM CD) 300 MG 24 hr capsule Take 1 capsule (300 mg total) by mouth daily. Patient taking differently: Take 300 mg by mouth every evening.  12/31/14   Satira Sark, MD  docusate sodium 100 MG CAPS Take 100 mg by mouth daily as needed for mild constipation. Patient not taking: Reported on 05/19/2015 01/08/15   Radene Gunning, NP  folic acid (FOLVITE) 1 MG tablet Take 1 mg by mouth daily.     Historical Provider, MD  furosemide (LASIX) 40 MG tablet Take 1 tablet (40 mg total) by mouth daily. *Sometimes takes two tablets in the morning and one tablet in the evening as regimen depending on fluid retention 01/08/15   Radene Gunning, NP  methocarbamol (ROBAXIN) 500 MG tablet Take 1 tablet by  mouth 2 (two) times daily. 09/02/13   Historical Provider, MD  methotrexate (RHEUMATREX) 2.5 MG tablet Take 10 mg by mouth 2 (two) times a week. *TAKE 8 TABLETS BY MOUTH FOR A TOTAL 20MG  WEEKLY Patient takes 4 tablets (10Mg ) on Fridays and 4 tablets (10mg ) on Saturdays    Historical Provider, MD  mometasone (NASONEX) 50 MCG/ACT nasal spray Place 2 sprays into the nose daily.    Historical Provider, MD  omeprazole (PRILOSEC) 20 MG capsule Take 20 mg by mouth 2 (two) times daily.     Historical Provider, MD  potassium chloride SA (K-DUR,KLOR-CON) 20 MEQ tablet Take 20 mEq by mouth 2 (two) times daily.  07/02/13   Historical Provider, MD  spironolactone (ALDACTONE) 25 MG tablet Take 0.5 tablets (12.5 mg total) by mouth daily. 01/08/15   Santiago Glad  M Black, NP   BP 121/73 mmHg  Pulse 95  Temp(Src) 98.6 F (37 C) (Oral)  Resp 22  Ht 5\' 8"  (1.727 m)  Wt 200 lb (90.719 kg)  BMI 30.42 kg/m2  SpO2 92% Physical Exam  Constitutional: He is oriented to person, place, and time. He appears well-developed and well-nourished. No distress.  HENT:  Mouth/Throat: Oropharynx is clear and moist.  Eyes: Conjunctivae are normal. No scleral icterus.  Neck: Neck supple. No tracheal deviation present.  Cardiovascular: Normal rate, normal heart sounds and intact distal pulses.  Exam reveals no gallop and no friction rub.   No murmur heard. Pulmonary/Chest: Effort normal. No accessory muscle usage. No respiratory distress.  Basilar rales  Abdominal: Soft. Bowel sounds are normal. He exhibits no distension. There is no tenderness.  Musculoskeletal: Normal range of motion. He exhibits edema.  2+ edema bilateral lower legs to knees.   Neurological: He is alert and oriented to person, place, and time.  Skin: Skin is warm and dry. He is not diaphoretic.  Psychiatric: He has a normal mood and affect.  Nursing note and vitals reviewed.   ED Course  Procedures (including critical care time) Labs Review   Results for  orders placed or performed during the hospital encounter of 05/19/15  CBC  Result Value Ref Range   WBC 11.2 (H) 4.0 - 10.5 K/uL   RBC 3.93 (L) 4.22 - 5.81 MIL/uL   Hemoglobin 12.7 (L) 13.0 - 17.0 g/dL   HCT 38.7 (L) 39.0 - 52.0 %   MCV 98.5 78.0 - 100.0 fL   MCH 32.3 26.0 - 34.0 pg   MCHC 32.8 30.0 - 36.0 g/dL   RDW 14.7 11.5 - 15.5 %   Platelets 104 (L) 150 - 400 K/uL  Comprehensive metabolic panel  Result Value Ref Range   Sodium 139 135 - 145 mmol/L   Potassium 3.9 3.5 - 5.1 mmol/L   Chloride 103 101 - 111 mmol/L   CO2 27 22 - 32 mmol/L   Glucose, Bld 100 (H) 65 - 99 mg/dL   BUN 18 6 - 20 mg/dL   Creatinine, Ser 1.09 0.61 - 1.24 mg/dL   Calcium 8.9 8.9 - 10.3 mg/dL   Total Protein 8.2 (H) 6.5 - 8.1 g/dL   Albumin 3.6 3.5 - 5.0 g/dL   AST 23 15 - 41 U/L   ALT 14 (L) 17 - 63 U/L   Alkaline Phosphatase 48 38 - 126 U/L   Total Bilirubin 3.3 (H) 0.3 - 1.2 mg/dL   GFR calc non Af Amer >60 >60 mL/min   GFR calc Af Amer >60 >60 mL/min   Anion gap 9 5 - 15  Protime-INR  Result Value Ref Range   Prothrombin Time 19.8 (H) 11.6 - 15.2 seconds   INR 1.68 (H) 0.00 - 1.49  Troponin I  Result Value Ref Range   Troponin I <0.03 <0.031 ng/mL  Brain natriuretic peptide  Result Value Ref Range   B Natriuretic Peptide 413.0 (H) 0.0 - 100.0 pg/mL   Dg Chest 2 View  05/19/2015   CLINICAL DATA:  Shortness of breath, cough.  EXAM: CHEST  2 VIEW  COMPARISON:  July 05, 2013.  FINDINGS: The heart size and mediastinal contours are within normal limits. Diffusely increased interstitial densities are noted throughout both lungs most consistent with edema. There is interval development of mild right pleural effusion. No pneumothorax is noted. The visualized skeletal structures are unremarkable.  IMPRESSION: Diffusely increased interstitial densities  are noted bilaterally consistent with pulmonary edema. Mild right pleural effusion is noted as well.   Electronically Signed   By: Marijo Conception, M.D.    On: 05/19/2015 14:46         EKG Interpretation   Date/Time:  Tuesday May 19 2015 17:08:46 EDT Ventricular Rate:  112 PR Interval:    QRS Duration: 112 QT Interval:  353 QTC Calculation: 482 R Axis:   -94 Text Interpretation:  Atrial fibrillation Incomplete RBBB and LAFB  Borderline prolonged QT interval No significant change since last tracing  Confirmed by Ashok Cordia  MD, Lennette Bihari ([id number]) on 05/19/2015 5:13:07 PM      MDM   Iv ns. Continuous pulse ox and monitor. Ecg. Labs.  Reviewed nursing notes and prior charts for additional history.   cxr c/w chf.   Lasix iv.  Reviewed nursing notes and prior charts for additional history.   bnp and cxr c/w chf.   Medical service contacted for admission.      Lajean Saver, MD 05/19/15 [phone]

## 2015-05-19 NOTE — ED Notes (Signed)
SOB for 3 days with cough.  Was seen by Lars Mage, NP at [facility] on yesterday for regular f/u.  Cough is non-productive.

## 2015-05-19 NOTE — H&P (Signed)
[facility] History and Physical  [name] DJT:[id number] DOB: [birth year] DOA: 05/19/2015  Referring physician: ER, Dr. Ashok Cordia PCP: [name], MD   Chief Complaint: Dyspnea. Cough.  HPI: [name] is a 81 y.o. male  This is an 81 year old man, ex-smoker, history of COPD, presents with 3 day history of nonproductive cough associated with dyspnea. He says that the dyspnea comes on even at rest. He also describes possibly increased swelling of his legs especially the left lung. There is no PND or orthopnea. He also does feel as if he has been running a fever and at the primary care physician's office yesterday, his temperature was apparently 100. He is now being admitted for further investigation and management.   Review of Systems:  Apart from symptoms above, all systems negative.  Past Medical History  Diagnosis Date  . COPD (chronic obstructive pulmonary disease)     Uses occasional nighttime O2  . Disseminated herpes zoster 2010  . GERD (gastroesophageal reflux disease)   . Asthma   . Pulmonary embolus     2003 and 2011  . Pulmonary nodules     Chest CT 09/11  . Mixed hyperlipidemia   . Atrial fibrillation     CHADS2 score 2  . Lupus anticoagulant positive   . Essential hypertension, benign   . Cholelithiasis   . Rheumatoid arthritis(714.0)   . History of chicken pox 1941; 2011  . Anemia   . Chronic lower back pain   . Chronic diastolic heart failure   . Cirrhosis     Questionable, AFP normal Feb 01/2012, hx of ETOH use   . Headache(784.0) 10/21/2013  . Cervical spondylosis without myelopathy 10/21/2013  . Stage III chronic kidney disease 01/05/2015    Baseline creatinine 1. for 5  . Cellulitis of leg, left 01/03/2015   Past Surgical History  Procedure Laterality Date  . Polypectomy  02/02/2012    RMR: Multiple colonic polyps removed, flat, tubular adenomas/ Left-sided diverticulosis  . Posterior lumbar vertebrae excision  2003; 2009; 2011    . Myringotomy      "3 times; both ears"  . Cataract extraction w/ intraocular lens  implant, bilateral    . Cholecystectomy  05/23/2012    Procedure: LAPAROSCOPIC CHOLECYSTECTOMY WITH INTRAOPERATIVE CHOLANGIOGRAM;  Surgeon: Stark Klein, MD;  Location: [facility];  Service: General;  Laterality: N/A;  . Esophagogastroduodenoscopy  02/02/12    ZES:[id number] size hiatal hernia; otherwise normal exam  . Colonoscopy  01/24/2013    Procedure: COLONOSCOPY;  Surgeon: Daneil Dolin, MD;  Location: [facility];  Service: Endoscopy;  Laterality: N/A;  8:30  . Back surgery     Social History:  reports that he quit smoking about 17 years ago. His smoking use included Cigarettes. He has a 57 pack-year smoking history. He has never used smokeless tobacco. He reports that he does not drink alcohol or use illicit drugs.  Allergies  Allergen Reactions  . Cymbalta [Duloxetine Hcl] Other (See Comments)    Confusion   . Procaine Hcl Hives and Other (See Comments)    NOVOCAINE: Sweating, Confusion, Not in right state of mind.  [name]    unknown    Family History  Problem Relation Age of Onset  . Colon cancer Neg Hx   . Liver disease Neg Hx     Prior to Admission medications   Medication Sig Start Date End Date Taking? Authorizing Provider  albuterol (PROAIR HFA) 108 (90 BASE)  MCG/ACT inhaler Inhale 2 puffs into the lungs every 6 (six) hours as needed. Shortness of Breath   Yes Historical Provider, MD  albuterol (PROVENTIL) (2.5 MG/3ML) 0.083% nebulizer solution Take 2.5 mg by nebulization every 6 (six) hours as needed. Shortness of Breath   Yes Historical Provider, MD  cetirizine (ZYRTEC) 10 MG tablet Take 10 mg by mouth daily.     Yes Historical Provider, MD  digoxin (LANOXIN) 0.125 MG tablet Take 0.125 mg by mouth daily.   Yes Historical Provider, MD  diltiazem (CARDIZEM CD) 300 MG 24 hr capsule Take 1 capsule (300 mg total) by mouth daily. Patient taking  differently: Take 300 mg by mouth every evening.  12/31/14  Yes Satira Sark, MD  Etanercept (ENBREL) 25 MG/0.5ML SOSY Inject into the skin 2 (two) times a week.    Yes Historical Provider, MD  ferrous sulfate 325 (65 FE) MG tablet Take 325 mg by mouth daily.   Yes Historical Provider, MD  folic acid (FOLVITE) 1 MG tablet Take 1 mg by mouth daily.    Yes Historical Provider, MD  gabapentin (NEURONTIN) 300 MG capsule TAKE ONE CAPSULE BY MOUTH EVERY MORNING AND TWO CAPSULES IN THE EVENING Patient taking differently: TAKE TWO CAPSULES BY MOUTH TWICE DAILY 02/03/15  Yes Kathrynn Ducking, MD  HYDROcodone-acetaminophen [facility]) 10-325 MG per tablet Take 1 tablet by mouth 2 (two) times daily. FOR HEADACHE PAIN 09/03/13  Yes Historical Provider, MD  ketoconazole (NIZORAL) 2 % shampoo Apply 1 application topically 3 (three) times a week.  05/21/13  Yes Historical Provider, MD  methotrexate (RHEUMATREX) 2.5 MG tablet Take 7.5 mg by mouth 2 (two) times a week. *TAKE 3 TABLETS BY MOUTH TWICE WEEKLY ON FRIDAYS AND SATURDAYS*   Yes Historical Provider, MD  mometasone (NASONEX) 50 MCG/ACT nasal spray Place 2 sprays into the nose daily.   Yes Historical Provider, MD  omeprazole (PRILOSEC) 40 MG capsule Take 40 mg by mouth daily.   Yes Historical Provider, MD  oxyCODONE-acetaminophen (PERCOCET) 10-325 MG per tablet Take 1 tablet by mouth every 6 (six) hours as needed for pain. 05/25/12  Yes Verlee Monte, MD  potassium chloride SA (K-DUR,KLOR-CON) 20 MEQ tablet Take 20 mEq by mouth 2 (two) times daily.  07/02/13  Yes Historical Provider, MD  tadalafil (CIALIS) 5 MG tablet Take 5 mg by mouth daily. 05/18/15  Yes Historical Provider, MD  Vitamin D, Ergocalciferol, (DRISDOL) 50000 UNITS CAPS capsule Take 1 capsule by mouth once a week. 04/27/15  Yes Historical Provider, MD  warfarin (COUMADIN) 2 MG tablet Take 5 mg by mouth See admin instructions. No medication on Wednesday. 5 on al other days   Yes Historical Provider, MD    docusate sodium 100 MG CAPS Take 100 mg by mouth daily as needed for mild constipation. Patient not taking: Reported on 05/19/2015 01/08/15   Radene Gunning, NP  furosemide (LASIX) 40 MG tablet Take 1 tablet (40 mg total) by mouth daily. *Sometimes takes two tablets in the morning and one tablet in the evening as regimen depending on fluid retention Patient not taking: Reported on 05/19/2015 01/08/15   Radene Gunning, NP  Omega-3 Fatty Acids (FISH OIL) 1000 MG CAPS Take 2 capsules by mouth 2 (two) times daily.     Historical Provider, MD  spironolactone (ALDACTONE) 25 MG tablet Take 0.5 tablets (12.5 mg total) by mouth daily. Patient not taking: Reported on 05/19/2015 01/08/15   Radene Gunning, NP   Physical Exam: Danley Danker Vitals:  05/19/15 1800 05/19/15 1815 05/19/15 1830 05/19/15 1845  BP: 132/68  135/70   Pulse: 75 105 116 105  Temp:      TempSrc:      Resp: 20 21 23 22   Height:      Weight:      SpO2: 84% 86% 87% 87%    Wt Readings from Last 3 Encounters:  05/19/15 90.719 kg (200 lb)  12/31/14 90.175 kg (198 lb 12.8 oz)  10/29/14 82.101 kg (181 lb)    General:  Appears calm and comfortable. He does not appear to be in respiratory distress at rest. He does not appear to have pain in his chest at the present time. He feels warm. Eyes: PERRL, normal lids, irises & conjunctiva ENT: grossly normal hearing, lips & tongue Neck: no LAD, masses or thyromegaly Cardiovascular: Irregularly irregular, consistent with atrial fibrillation. Edema both legs more on the left but nonpitting. Telemetry: Atrial fibrillation. Respiratory: Bilateral inspiratory crackles more on the right than the left. There is no bronchial breathing. Abdomen: soft, ntnd Skin: no rash or induration seen on limited exam Musculoskeletal: grossly normal tone BUE/BLE Psychiatric: grossly normal mood and affect, speech fluent and appropriate Neurologic: grossly non-focal.          Labs on Admission:  Basic Metabolic  Panel:  Recent Labs Lab 05/19/15 1545  NA 139  K 3.9  CL 103  CO2 27  GLUCOSE 100*  BUN 18  CREATININE 1.09  CALCIUM 8.9   Liver Function Tests:  Recent Labs Lab 05/19/15 1545  AST 23  ALT 14*  ALKPHOS 48  BILITOT 3.3*  PROT 8.2*  ALBUMIN 3.6   No results for input(s): LIPASE, AMYLASE in the last 168 hours. No results for input(s): AMMONIA in the last 168 hours. CBC:  Recent Labs Lab 05/19/15 1545  WBC 11.2*  HGB 12.7*  HCT 38.7*  MCV 98.5  PLT 104*   Cardiac Enzymes:  Recent Labs Lab 05/19/15 1545  TROPONINI <0.03    BNP (last 3 results)  Recent Labs  05/19/15 1545  BNP 413.0*    ProBNP (last 3 results) No results for input(s): PROBNP in the last 8760 hours.  CBG: No results for input(s): GLUCAP in the last 168 hours.  Radiological Exams on Admission: Dg Chest 2 View  05/19/2015   CLINICAL DATA:  Shortness of breath, cough.  EXAM: CHEST  2 VIEW  COMPARISON:  July 05, 2013.  FINDINGS: The heart size and mediastinal contours are within normal limits. Diffusely increased interstitial densities are noted throughout both lungs most consistent with edema. There is interval development of mild right pleural effusion. No pneumothorax is noted. The visualized skeletal structures are unremarkable.  IMPRESSION: Diffusely increased interstitial densities are noted bilaterally consistent with pulmonary edema. Mild right pleural effusion is noted as well.   Electronically Signed   By: Marijo Conception, M.D.   On: 05/19/2015 14:46    EKG: Independently reviewed. Atrial fibrillation. There is no acute ST-T wave changes.  Assessment/Plan   1. COPD exacerbation. I think this is the cause of the majority of his symptoms. He currently does not appear to have bronchospasm. We will treat him with intravenous anti-biotics. He can have bronchial dilators as necessary. 2. Congestive heart failure. I think he probably does have an element of this, diastolic in nature.  He'll be treated with intravenous Lasix daily and monitor clinically. He may need repeat echocardiogram and cardiology consultation depending on his progress. 3. Chronic atrial fibrillation.  This appears to be stable. He will continue with warfarin also. 4. Hypertension. Stable.  Further recommendations will depend on patient's hospital progress.   Code Status: Full code.  DVT Prophylaxis: Warfarin.  Family Communication: I discussed the plan with the patient at the bedside.   Disposition Plan: Home when medically stable.   Time spent: 60 minutes.  Doree Albee [facility] Pager [phone].

## 2015-05-20 ENCOUNTER — Encounter (HOSPITAL_COMMUNITY): Payer: Self-pay | Admitting: Cardiology

## 2015-05-20 ENCOUNTER — Inpatient Hospital Stay (HOSPITAL_COMMUNITY): Payer: Medicare Other

## 2015-05-20 DIAGNOSIS — D696 Thrombocytopenia, unspecified: Secondary | ICD-10-CM

## 2015-05-20 DIAGNOSIS — I482 Chronic atrial fibrillation: Secondary | ICD-10-CM

## 2015-05-20 DIAGNOSIS — I5033 Acute on chronic diastolic (congestive) heart failure: Secondary | ICD-10-CM

## 2015-05-20 DIAGNOSIS — I509 Heart failure, unspecified: Secondary | ICD-10-CM

## 2015-05-20 DIAGNOSIS — J441 Chronic obstructive pulmonary disease with (acute) exacerbation: Principal | ICD-10-CM

## 2015-05-20 LAB — INFLUENZA PANEL BY PCR (TYPE A & B)
H1N1 flu by pcr: NOT DETECTED
INFLAPCR: NEGATIVE
Influenza B By PCR: NEGATIVE

## 2015-05-20 LAB — CBC
HEMATOCRIT: 40.6 % (ref 39.0–52.0)
HEMOGLOBIN: 13.1 g/dL (ref 13.0–17.0)
MCH: 31.7 pg (ref 26.0–34.0)
MCHC: 32.3 g/dL (ref 30.0–36.0)
MCV: 98.3 fL (ref 78.0–100.0)
PLATELETS: 136 10*3/uL — AB (ref 150–400)
RBC: 4.13 MIL/uL — ABNORMAL LOW (ref 4.22–5.81)
RDW: 14.7 % (ref 11.5–15.5)
WBC: 13.2 10*3/uL — ABNORMAL HIGH (ref 4.0–10.5)

## 2015-05-20 LAB — URINALYSIS, ROUTINE W REFLEX MICROSCOPIC
BILIRUBIN URINE: NEGATIVE
Glucose, UA: NEGATIVE mg/dL
Ketones, ur: NEGATIVE mg/dL
NITRITE: NEGATIVE
Protein, ur: 30 mg/dL — AB
Specific Gravity, Urine: 1.015 (ref 1.005–1.030)
Urobilinogen, UA: 4 mg/dL — ABNORMAL HIGH (ref 0.0–1.0)
pH: 6.5 (ref 5.0–8.0)

## 2015-05-20 LAB — IRON AND TIBC
IRON: 46 ug/dL (ref 45–182)
SATURATION RATIOS: 15 % — AB (ref 17.9–39.5)
TIBC: 304 ug/dL (ref 250–450)
UIBC: 258 ug/dL

## 2015-05-20 LAB — COMPREHENSIVE METABOLIC PANEL
ALK PHOS: 48 U/L (ref 38–126)
ALT: 13 U/L — ABNORMAL LOW (ref 17–63)
ANION GAP: 6 (ref 5–15)
AST: 21 U/L (ref 15–41)
Albumin: 3.4 g/dL — ABNORMAL LOW (ref 3.5–5.0)
BUN: 20 mg/dL (ref 6–20)
CHLORIDE: 104 mmol/L (ref 101–111)
CO2: 28 mmol/L (ref 22–32)
Calcium: 8.9 mg/dL (ref 8.9–10.3)
Creatinine, Ser: 1.09 mg/dL (ref 0.61–1.24)
GFR calc non Af Amer: >60 mL/min (ref >60)
Glucose, Bld: 102 mg/dL — ABNORMAL HIGH (ref 65–99)
POTASSIUM: 3.8 mmol/L (ref 3.5–5.1)
Sodium: 138 mmol/L (ref 135–145)
TOTAL PROTEIN: 8.2 g/dL — AB (ref 6.5–8.1)
Total Bilirubin: 3.8 mg/dL — ABNORMAL HIGH (ref 0.3–1.2)

## 2015-05-20 LAB — PROTIME-INR
INR: 1.62 — AB (ref 0.00–1.49)
PROTHROMBIN TIME: 19.3 s — AB (ref 11.6–15.2)

## 2015-05-20 LAB — BASIC METABOLIC PANEL
ANION GAP: 5 (ref 5–15)
BUN: 20 mg/dL (ref 6–20)
CALCIUM: 8.6 mg/dL — AB (ref 8.9–10.3)
CO2: 29 mmol/L (ref 22–32)
Chloride: 104 mmol/L (ref 101–111)
Creatinine, Ser: 1.12 mg/dL (ref 0.61–1.24)
GFR calc Af Amer: >60 mL/min (ref >60)
GFR calc non Af Amer: 60 mL/min — ABNORMAL LOW (ref >60)
GLUCOSE: 105 mg/dL — AB (ref 65–99)
Potassium: 3.7 mmol/L (ref 3.5–5.1)
Sodium: 138 mmol/L (ref 135–145)

## 2015-05-20 LAB — BRAIN NATRIURETIC PEPTIDE: B NATRIURETIC PEPTIDE 5: 382 pg/mL — AB (ref 0.0–100.0)

## 2015-05-20 LAB — VITAMIN B12: VITAMIN B 12: 507 pg/mL (ref 180–914)

## 2015-05-20 LAB — URINE MICROSCOPIC-ADD ON

## 2015-05-20 LAB — MRSA PCR SCREENING: MRSA by PCR: NEGATIVE

## 2015-05-20 MED ORDER — IPRATROPIUM BROMIDE 0.02 % IN SOLN
0.5000 mg | Freq: Four times a day (QID) | RESPIRATORY_TRACT | Status: DC
  Administered 2015-05-20 – 2015-05-23 (×10): 0.5 mg via RESPIRATORY_TRACT
  Filled 2015-05-20 (×10): qty 2.5

## 2015-05-20 MED ORDER — WARFARIN SODIUM 5 MG PO TABS
5.0000 mg | ORAL_TABLET | Freq: Once | ORAL | Status: AC
  Administered 2015-05-20: 5 mg via ORAL
  Filled 2015-05-20: qty 1

## 2015-05-20 MED ORDER — LEVALBUTEROL HCL 0.63 MG/3ML IN NEBU
0.6300 mg | INHALATION_SOLUTION | Freq: Four times a day (QID) | RESPIRATORY_TRACT | Status: DC
  Administered 2015-05-20 – 2015-05-23 (×10): 0.63 mg via RESPIRATORY_TRACT
  Filled 2015-05-20 (×10): qty 3

## 2015-05-20 MED ORDER — FUROSEMIDE 10 MG/ML IJ SOLN
40.0000 mg | Freq: Two times a day (BID) | INTRAMUSCULAR | Status: DC
  Filled 2015-05-20: qty 4

## 2015-05-20 MED ORDER — LEVOFLOXACIN IN D5W 750 MG/150ML IV SOLN
750.0000 mg | INTRAVENOUS | Status: DC
  Administered 2015-05-20: 750 mg via INTRAVENOUS
  Filled 2015-05-20: qty 150

## 2015-05-20 MED ORDER — ALBUTEROL SULFATE (2.5 MG/3ML) 0.083% IN NEBU: 2.5000 mg | INHALATION_SOLUTION | RESPIRATORY_TRACT | Status: DC | PRN

## 2015-05-20 MED ORDER — SODIUM CHLORIDE 0.9 % IV BOLUS (SEPSIS): 500.0000 mL | Freq: Once | INTRAVENOUS | Status: DC

## 2015-05-20 MED ORDER — FUROSEMIDE 10 MG/ML IJ SOLN: 60.0000 mg | Freq: Every day | INTRAMUSCULAR | Status: DC

## 2015-05-20 MED ORDER — HYDROCOD POLST-CPM POLST ER 10-8 MG/5ML PO SUER
5.0000 mL | Freq: Two times a day (BID) | ORAL | Status: DC | PRN
  Administered 2015-05-22 – 2015-05-25 (×3): 5 mL via ORAL
  Filled 2015-05-20 (×3): qty 5

## 2015-05-20 MED ORDER — BENZONATATE 100 MG PO CAPS
100.0000 mg | ORAL_CAPSULE | Freq: Three times a day (TID) | ORAL | Status: DC
  Administered 2015-05-20 – 2015-05-25 (×16): 100 mg via ORAL
  Filled 2015-05-20 (×16): qty 1

## 2015-05-20 NOTE — Care Management Note (Signed)
Case Management Note  Patient Details  Name: [name] MRN: [id number] Date of Birth: 07/30/1931   Expected Discharge Date:                  Expected Discharge Plan:  Home/Self Care  In-House Referral:  NA  Discharge planning Services  CM Consult  Post Acute Care Choice:  NA Choice offered to:  NA  DME Arranged:    DME Agency:     HH Arranged:    HH Agency:     Status of Service:  Completed, signed off  Medicare Important Message Given:    Date Medicare IM Given:    Medicare IM give by:    Date Additional Medicare IM Given:    Additional Medicare Important Message give by:     If discussed at [place] of Stay Meetings, dates discussed:    Additional Comments: Pt is from home, lives alone and is independent at baseline. Pt uses a cane for support. Pt has no other DME's or [facility] services prior to admission. Pt plans to return home with self care at DC. No CM needs anticipated.   Sherald Barge, RN 05/20/2015, 10:32 AM

## 2015-05-20 NOTE — Progress Notes (Signed)
Notified MD, pt's weight has increased from 84.086 kg to 88.86 kg.

## 2015-05-20 NOTE — Progress Notes (Signed)
Pt lost IV access around lunchtime today. 2 floor nurses and one nurse from OR have attempted to re-gain access. AC notified. Will hold antibiotic until we can get access again

## 2015-05-20 NOTE — Progress Notes (Signed)
Report given to oncoming nurse. No pt concerns were noted during report at bedside. Pt's were stable during change of shift assessment. Day shift nurse verbalized understanding.

## 2015-05-20 NOTE — Progress Notes (Signed)
ANTICOAGULATION CONSULT NOTE - follow up  Pharmacy Consult for Warfarin Indication: atrial fibrillation  Allergies  Allergen Reactions  . Cymbalta [Duloxetine Hcl] Other (See Comments)    Confusion   . Procaine Hcl Hives and Other (See Comments)    NOVOCAINE: Sweating, Confusion, Not in right state of mind.  [name]    unknown   Patient Measurements: Height: 5\' 8"  (172.7 cm) Weight: 196 lb 6.4 oz (89.086 kg) IBW/kg (Calculated) : 68.4  Vital Signs: Temp: 99 F (37.2 C) (05/25 0506) Temp Source: Oral (05/25 0506) BP: 120/64 mmHg (05/25 0506) Pulse Rate: 133 (05/25 0506)  Labs:  Recent Labs  05/19/15 1545 05/20/15 0533  HGB 12.7* 13.1  HCT 38.7* 40.6  PLT 104* 136*  LABPROT 19.8* 19.3*  INR 1.68* 1.62*  CREATININE 1.09 1.09  TROPONINI <0.03  --    Estimated Creatinine Clearance: 57.7 mL/min (by C-G formula based on Cr of 1.09).  Medical History: Past Medical History  Diagnosis Date  . COPD (chronic obstructive pulmonary disease)     Uses occasional nighttime O2  . Disseminated herpes zoster 2010  . GERD (gastroesophageal reflux disease)   . Asthma   . Pulmonary embolus     2003 and 2011  . Pulmonary nodules     Chest CT 09/11  . Mixed hyperlipidemia   . Atrial fibrillation     CHADS2 score 2  . Lupus anticoagulant positive   . Essential hypertension, benign   . Cholelithiasis   . Rheumatoid arthritis(714.0)   . History of chicken pox 1941; 2011  . Anemia   . Chronic lower back pain   . Chronic diastolic heart failure   . Cirrhosis     Questionable, AFP normal Feb 01/2012, hx of ETOH use   . Headache(784.0) 10/21/2013  . Cervical spondylosis without myelopathy 10/21/2013  . Stage III chronic kidney disease 01/05/2015    Baseline creatinine 1. for 5  . Cellulitis of leg, left 01/03/2015   Medications:  Prescriptions prior to admission  Medication Sig Dispense Refill Last Dose  . albuterol (PROAIR HFA) 108 (90  BASE) MCG/ACT inhaler Inhale 2 puffs into the lungs every 6 (six) hours as needed. Shortness of Breath   05/18/2015 at Unknown time  . albuterol (PROVENTIL) (2.5 MG/3ML) 0.083% nebulizer solution Take 2.5 mg by nebulization every 6 (six) hours as needed. Shortness of Breath   Past Month at Unknown time  . cetirizine (ZYRTEC) 10 MG tablet Take 10 mg by mouth daily.     05/18/2015 at Unknown time  . digoxin (LANOXIN) 0.125 MG tablet Take 0.125 mg by mouth daily.   05/19/2015 at Unknown time  . diltiazem (CARDIZEM CD) 300 MG 24 hr capsule Take 1 capsule (300 mg total) by mouth daily. (Patient taking differently: Take 300 mg by mouth every evening. ) 30 capsule 11 05/18/2015 at Unknown time  . Etanercept (ENBREL) 25 MG/0.5ML SOSY Inject into the skin 2 (two) times a week.    05/15/2015  . ferrous sulfate 325 (65 FE) MG tablet Take 325 mg by mouth daily.   05/19/2015 at Unknown time  . folic acid (FOLVITE) 1 MG tablet Take 1 mg by mouth daily.    05/19/2015 at Unknown time  . gabapentin (NEURONTIN) 300 MG capsule TAKE ONE CAPSULE BY MOUTH EVERY MORNING AND TWO CAPSULES IN THE EVENING (Patient taking differently: TAKE TWO CAPSULES BY MOUTH TWICE DAILY) 30 capsule 0 05/19/2015 at Unknown time  . HYDROcodone-acetaminophen (NORCO) 10-325  MG per tablet Take 1 tablet by mouth 2 (two) times daily. FOR HEADACHE PAIN   05/19/2015 at Unknown time  . ketoconazole (NIZORAL) 2 % shampoo Apply 1 application topically 3 (three) times a week.    Past Week at Unknown time  . methotrexate (RHEUMATREX) 2.5 MG tablet Take 7.5 mg by mouth 2 (two) times a week. *TAKE 3 TABLETS BY MOUTH TWICE WEEKLY ON FRIDAYS AND SATURDAYS*   05/16/2015  . mometasone (NASONEX) 50 MCG/ACT nasal spray Place 2 sprays into the nose daily.   05/19/2015 at Unknown time  . omeprazole (PRILOSEC) 40 MG capsule Take 40 mg by mouth daily.   05/19/2015 at Unknown time  . oxyCODONE-acetaminophen (PERCOCET) 10-325 MG per tablet Take 1 tablet by mouth every 6 (six) hours  as needed for pain.   05/19/2015 at Unknown time  . potassium chloride SA (K-DUR,KLOR-CON) 20 MEQ tablet Take 20 mEq by mouth 2 (two) times daily.    05/19/2015 at Unknown time  . tadalafil (CIALIS) 5 MG tablet Take 5 mg by mouth daily.   UNKNOWN  . Vitamin D, Ergocalciferol, (DRISDOL) 50000 UNITS CAPS capsule Take 1 capsule by mouth once a week.  4 Past Week at Unknown time  . warfarin (COUMADIN) 2 MG tablet Take 5 mg by mouth See admin instructions. No medication on Wednesday. 5 on al other days   05/18/2015 at 2000  . docusate sodium 100 MG CAPS Take 100 mg by mouth daily as needed for mild constipation. (Patient not taking: Reported on 05/19/2015) 10 capsule 0   . furosemide (LASIX) 40 MG tablet Take 1 tablet (40 mg total) by mouth daily. *Sometimes takes two tablets in the morning and one tablet in the evening as regimen depending on fluid retention (Patient not taking: Reported on 05/19/2015) 30 tablet 0   . Omega-3 Fatty Acids (FISH OIL) 1000 MG CAPS Take 2 capsules by mouth 2 (two) times daily.    Not Taking at Unknown time  . spironolactone (ALDACTONE) 25 MG tablet Take 0.5 tablets (12.5 mg total) by mouth daily. (Patient not taking: Reported on 05/19/2015) 30 tablet 0    Assessment: Okay for Protocol, INR below goal.   Active orders for Levaquin and Methotrexate which can both elevate INR response.  Goal of Therapy:  INR 2-3   Plan:  Warfarin 5mg  PO x 1 (since INR below goal) Daily PT/INR  [place] Crane, Brenten Janney A 05/20/2015,7:57 AM

## 2015-05-20 NOTE — Plan of Care (Signed)
Problem: Phase I Progression Outcomes Goal: Hemodynamically stable Outcome: Progressing Pt's temperature has decreased.

## 2015-05-20 NOTE — Progress Notes (Signed)
[facility] PROGRESS NOTE  [name] QQV:[id number] DOB: 1931-05-12 DOA: 05/19/2015 PCP: [name], MD    Code Status: Full code: Full code Family Communication: Discussed with patient, family not available Disposition Plan: Discharge when clinically appropriate   Consultants:  Cardiology  Procedures:  2-D echocardiogram pending  Antibiotics:  Levaquin 5/24>>  HPI/Subjective: Patient says he's feeling a little better and a little short of breath, but he has a nonproductive cough.  Objective: Filed Vitals:   05/20/15 0506  BP: 120/64  Pulse: 133  Temp: 99 F (37.2 C)  Resp: 20   oxygen saturation 97% on room air.  Intake/Output Summary (Last 24 hours) at 05/20/15 1103 Last data filed at 05/20/15 0800  Gross per 24 hour  Intake 1164.66 ml  Output    950 ml  Net 214.66 ml   Filed Weights   05/19/15 1321 05/19/15 1944 05/20/15 0506  Weight: 90.719 kg (200 lb) 88.86 kg (195 lb 14.4 oz) 89.086 kg (196 lb 6.4 oz)    Exam:   General:  [name] in no acute distress.  Cardiovascular: Irregular, irregular, with mild tachycardia and a 2 to 3/6 systolic murmur.  Respiratory: Mild bilateral rhonchi and occasional wheezes  Abdomen: Positive bowel sounds, soft, nontender, non-distended.  Musculoskeletal/extremities: Trace of pedal edema bilaterally. No acute hot red joints.  Neurologic: He is alert and oriented 2. Cranial nerves II through XII are grossly intact.   Data Reviewed: Basic Metabolic Panel:  Recent Labs Lab 05/19/15 1545 05/20/15 0533 05/20/15 0741  NA 139 138 138  K 3.9 3.8 3.7  CL 103 104 104  CO2 27 28 29   GLUCOSE 100* 102* 105*  BUN 18 20 20   CREATININE 1.09 1.09 1.12  CALCIUM 8.9 8.9 8.6*   Liver Function Tests:  Recent Labs Lab 05/19/15 1545 05/20/15 0533  AST 23 21  ALT 14* 13*  ALKPHOS 48 48  BILITOT 3.3* 3.8*  PROT 8.2* 8.2*  ALBUMIN 3.6 3.4*   No results for input(s): LIPASE,  AMYLASE in the last 168 hours. No results for input(s): AMMONIA in the last 168 hours. CBC:  Recent Labs Lab 05/19/15 1545 05/20/15 0533  WBC 11.2* 13.2*  HGB 12.7* 13.1  HCT 38.7* 40.6  MCV 98.5 98.3  PLT 104* 136*   Cardiac Enzymes:  Recent Labs Lab 05/19/15 1545  TROPONINI <0.03   BNP (last 3 results)  Recent Labs  05/19/15 1545 05/20/15 0533  BNP 413.0* 382.0*    ProBNP (last 3 results) No results for input(s): PROBNP in the last 8760 hours.  CBG: No results for input(s): GLUCAP in the last 168 hours.  Recent Results (from the past 240 hour(s))  Culture, blood (routine x 2)     Status: None (Preliminary result)   Collection Time: 05/19/15  9:00 PM  Result Value Ref Range Status   Specimen Description BLOOD RIGHT ARM  Final   Special Requests BOTTLES DRAWN AEROBIC AND ANAEROBIC 6CC  Final   Culture PENDING  Incomplete   Report Status PENDING  Incomplete  Culture, blood (routine x 2)     Status: None (Preliminary result)   Collection Time: 05/19/15  9:05 PM  Result Value Ref Range Status   Specimen Description BLOOD RIGHT HAND  Final   Special Requests BOTTLES DRAWN AEROBIC AND ANAEROBIC [facility]  Final   Culture PENDING  Incomplete   Report Status PENDING  Incomplete  MRSA PCR Screening     Status: None   Collection Time: 05/20/15  12:02 AM  Result Value Ref Range Status   MRSA by PCR NEGATIVE NEGATIVE Final    Comment:        The GeneXpert MRSA Assay (FDA approved for NASAL specimens only), is one component of a comprehensive MRSA colonization surveillance program. It is not intended to diagnose MRSA infection nor to guide or monitor treatment for MRSA infections.      Studies: Dg Chest 2 View  05/19/2015   CLINICAL DATA:  Shortness of breath, cough.  EXAM: CHEST  2 VIEW  COMPARISON:  July 05, 2013.  FINDINGS: The heart size and mediastinal contours are within normal limits. Diffusely increased interstitial densities are noted throughout both lungs  most consistent with edema. There is interval development of mild right pleural effusion. No pneumothorax is noted. The visualized skeletal structures are unremarkable.  IMPRESSION: Diffusely increased interstitial densities are noted bilaterally consistent with pulmonary edema. Mild right pleural effusion is noted as well.   Electronically Signed   By: Marijo Conception, M.D.   On: 05/19/2015 14:46    Scheduled Meds: . digoxin  0.125 mg Oral Daily  . diltiazem  300 mg Oral Daily  . [START ON 05/21/2015] Etanercept  25 mg Subcutaneous Once per day on Tue Thu  . ferrous sulfate  325 mg Oral Daily  . fluticasone  2 spray Each Nare Daily  . folic acid  1 mg Oral Daily  . [START ON 05/21/2015] furosemide  60 mg Intravenous Daily  . gabapentin  600 mg Oral BID  . ketoconazole  1 application Topical Once per day on Mon Wed Fri  . levofloxacin (LEVAQUIN) IV  750 mg Intravenous Q24H  . loratadine  10 mg Oral Daily  . [START ON 05/22/2015] methotrexate  7.5 mg Oral Once per day on Fri Sat  . omega-3 acid ethyl esters  1 g Oral Daily  . pantoprazole  80 mg Oral Daily  . potassium chloride SA  20 mEq Oral BID  . sodium chloride  3 mL Intravenous Q12H  . Vitamin D (Ergocalciferol)  50,000 Units Oral Weekly  . warfarin  5 mg Oral Once  . Warfarin - Pharmacist Dosing Inpatient   Does not apply Q24H   Continuous Infusions: . sodium chloride 10 mL/hr at 05/19/15 2153   Assessment and plan:  Principal Problem:   COPD exacerbation Active Problems:   Acute on chronic diastolic CHF (congestive heart failure)   Chronic atrial fibrillation   Essential hypertension, benign   Thrombocytopenia   1. COPD exacerbation. The patient has a history of COPD, non-oxygen dependent. He stopped smoking years ago. He was started on IV Levaquin; this will be continued with higher dosing of 750 mg daily given his recent fever. We'll add Xopenex and Atrovent nebulizer every 6 hours. We'll hold on IV steroid treatment  as he does not appear to have significant bronchospasms and may likely decrease his immunity further.  Fever. This is presumed to be secondary to COPD with exacerbation. The patient has some immunocompromise on Enbrel and methotrexate for treatment of rheumatoid arthritis. These will be withheld for now. Blood cultures were ordered and are pending. We'll order an influenza panel.  Acute on chronic diastolic heart failure. On admission, the patient's chest x-ray was indicative of some pulmonary edema. Cardiology was consulted and noted that the patient has a history of diastolic dysfunction with an EF of 70-75% and mild aortic stenosis per previous echo in 2014. Cardiology has increased the dose of Lasix to 60  mg IV daily. Patient's TSH is within normal limits at 1.0. 2-D echocardiogram ordered and is pending.  Chronic atrial fibrillation; with RVR. His heart rate in part increased due to his fever. His heart rate and fever curve has improved. We'll continue diltiazem and digoxin. Continue Coumadin, dosing per pharmacy.  Thrombocytopenia. The patient has a history of thrombocytopenia with a baseline ranging from 74-125. Etiology unknown but could be secondary to Enbrel and methotrexate. His TSH is within normal limits. We'll order a vitamin B12 level to rule out deficiency.   Time spent: 35 minutes    [facility] Pager [phone]. If 7PM-7AM, please contact night-coverage at [email or web address], password [facility] 05/20/2015, 11:03 AM  LOS: 1 day

## 2015-05-20 NOTE — Consult Note (Signed)
Reason for Consult: CHF Referring Physician: PTH Consulting Cardiologist: Dr. Verlee Monte [name] is an 81 y.o. male patient of Dr. Domenic Polite with history of Chronic Atrial fibrillation on diltiazem and Coumadin, Hx of Pulmonary embolism (lupus anticoagulant positive), HTN, COPD and mild AS on echo in 7262, diastolic CHF.  Patient presents with several day history of worsening cough and dyspnea. Temp 102.4. He has chronic dyspnea but it worsened. He has occasional leg edema. He no longer smokes. He denies chest pain, rapid palpitations, dizziness or presyncope. CHADVASC=6 for age, HTN, CHF, and PE. Patient lives alone and eats out a lot-often salty foods.  BNP 413, WBC 13, CXR pulmonary edema, EKG-Afib at 112/m with IRBBB. Last 2Decho 2014 EF 70-75%, mild AS mean gradient 12 mmHg.    Past Medical History  Diagnosis Date  . COPD (chronic obstructive pulmonary disease)     Uses occasional nighttime O2  . Disseminated herpes zoster 2010  . GERD (gastroesophageal reflux disease)   . Asthma   . Pulmonary embolus     2003 and 2011  . Pulmonary nodules     Chest CT 09/11  . Mixed hyperlipidemia   . Chronic atrial fibrillation   . Lupus anticoagulant positive   . Essential hypertension, benign   . Cholelithiasis   . Rheumatoid arthritis(714.0)   . History of chicken pox 1941; 2011  . Anemia   . Chronic lower back pain   . Chronic diastolic heart failure   . Cirrhosis     Questionable, AFP normal Feb 01/2012, hx of ETOH use   . Headache(784.0)   . Cervical spondylosis without myelopathy   . Stage III chronic kidney disease   . Cellulitis of leg, left 01/03/2015    Past Surgical History  Procedure Laterality Date  . Polypectomy  02/02/2012    RMR: Multiple colonic polyps removed, flat, tubular adenomas/ Left-sided diverticulosis  . Posterior lumbar vertebrae excision  2003; 2009; 2011  . Myringotomy      "3 times; both ears"  . Cataract extraction w/ intraocular lens   implant, bilateral    . Cholecystectomy  05/23/2012    Procedure: LAPAROSCOPIC CHOLECYSTECTOMY WITH INTRAOPERATIVE CHOLANGIOGRAM;  Surgeon: Stark Klein, MD;  Location: [place];  Service: General;  Laterality: N/A;  . Esophagogastroduodenoscopy  02/02/12    MBT:DHRCBULA size hiatal hernia; otherwise normal exam  . Colonoscopy  01/24/2013    Procedure: COLONOSCOPY;  Surgeon: Daneil Dolin, MD;  [facility];  Service: Endoscopy;  Laterality: N/A;  8:30  . Back surgery      Family History  Problem Relation Age of Onset  . Colon cancer Neg Hx   . Liver disease Neg Hx     Social History:  reports that he quit smoking about 17 years ago. His smoking use included Cigarettes. He has a 57 pack-year smoking history. He has never used smokeless tobacco. He reports that he does not drink alcohol or use illicit drugs.  Allergies:  Allergies  Allergen Reactions  . Cymbalta [Duloxetine Hcl] Other (See Comments)    Confusion   . Procaine Hcl Hives and Other (See Comments)    NOVOCAINE: Sweating, Confusion, Not in right state of mind.  [name]    unknown    Medications: { Scheduled Meds: . digoxin  0.125 mg Oral Daily  . diltiazem  300 mg Oral Daily  . [START ON 05/21/2015] Etanercept  25 mg Subcutaneous Once per day  on Tue Thu  . ferrous sulfate  325 mg Oral Daily  . fluticasone  2 spray Each Nare Daily  . folic acid  1 mg Oral Daily  . furosemide  40 mg Intravenous BID  . gabapentin  600 mg Oral BID  . ketoconazole  1 application Topical Once per day on Mon Wed Fri  . levofloxacin (LEVAQUIN) IV  500 mg Intravenous Q24H  . loratadine  10 mg Oral Daily  . [START ON 05/22/2015] methotrexate  7.5 mg Oral Once per day on Fri Sat  . omega-3 acid ethyl esters  1 g Oral Daily  . pantoprazole  80 mg Oral Daily  . potassium chloride SA  20 mEq Oral BID  . sodium chloride  3 mL Intravenous Q12H  . Vitamin D (Ergocalciferol)  50,000 Units Oral Weekly   . warfarin  5 mg Oral Once  . Warfarin - Pharmacist Dosing Inpatient   Does not apply Q24H   Continuous Infusions: . sodium chloride 10 mL/hr at 05/19/15 2153   PRN Meds:.acetaminophen, albuterol, diclofenac sodium, ondansetron **OR** ondansetron (ZOFRAN) IV, oxyCODONE-acetaminophen **AND** oxyCODONE   Results for orders placed or performed during the hospital encounter of 05/19/15 (from the past 48 hour(s))  CBC     Status: Abnormal   Collection Time: 05/19/15  3:45 PM  Result Value Ref Range   WBC 11.2 (H) 4.0 - 10.5 K/uL   RBC 3.93 (L) 4.22 - 5.81 MIL/uL   Hemoglobin 12.7 (L) 13.0 - 17.0 g/dL   HCT 38.7 (L) 39.0 - 52.0 %   MCV 98.5 78.0 - 100.0 fL   MCH 32.3 26.0 - 34.0 pg   MCHC 32.8 30.0 - 36.0 g/dL   RDW 14.7 11.5 - 15.5 %   Platelets 104 (L) 150 - 400 K/uL    Comment: SPECIMEN CHECKED FOR CLOTS CONSISTENT WITH PREVIOUS RESULT   Comprehensive metabolic panel     Status: Abnormal   Collection Time: 05/19/15  3:45 PM  Result Value Ref Range   Sodium 139 135 - 145 mmol/L   Potassium 3.9 3.5 - 5.1 mmol/L   Chloride 103 101 - 111 mmol/L   CO2 27 22 - 32 mmol/L   Glucose, Bld 100 (H) 65 - 99 mg/dL   BUN 18 6 - 20 mg/dL   Creatinine, Ser 1.09 0.61 - 1.24 mg/dL   Calcium 8.9 8.9 - 10.3 mg/dL   Total Protein 8.2 (H) 6.5 - 8.1 g/dL   Albumin 3.6 3.5 - 5.0 g/dL   AST 23 15 - 41 U/L   ALT 14 (L) 17 - 63 U/L   Alkaline Phosphatase 48 38 - 126 U/L   Total Bilirubin 3.3 (H) 0.3 - 1.2 mg/dL   GFR calc non Af Amer >60 >60 mL/min   GFR calc Af Amer >60 >60 mL/min    Comment: (NOTE) The eGFR has been calculated using the CKD EPI equation. This calculation has not been validated in all clinical situations. eGFR's persistently <60 mL/min signify possible Chronic Kidney Disease.    Anion gap 9 5 - 15  Protime-INR     Status: Abnormal   Collection Time: 05/19/15  3:45 PM  Result Value Ref Range   Prothrombin Time 19.8 (H) 11.6 - 15.2 seconds   INR 1.68 (H) 0.00 - 1.49   Troponin I     Status: None   Collection Time: 05/19/15  3:45 PM  Result Value Ref Range   Troponin I <0.03 <0.031 ng/mL    Comment:  NO INDICATION OF MYOCARDIAL INJURY.   Brain natriuretic peptide     Status: Abnormal   Collection Time: 05/19/15  3:45 PM  Result Value Ref Range   B Natriuretic Peptide 413.0 (H) 0.0 - 100.0 pg/mL  Digoxin level     Status: Abnormal   Collection Time: 05/19/15  3:45 PM  Result Value Ref Range   Digoxin Level <0.2 (L) 0.8 - 2.0 ng/mL  TSH     Status: None   Collection Time: 05/19/15  3:45 PM  Result Value Ref Range   TSH 1.001 0.350 - 4.500 uIU/mL  Lactic acid, plasma     Status: None   Collection Time: 05/19/15  3:45 PM  Result Value Ref Range   Lactic Acid, Venous 1.5 0.5 - 2.0 mmol/L  Lactic acid, plasma     Status: None   Collection Time: 05/19/15  9:00 PM  Result Value Ref Range   Lactic Acid, Venous 1.5 0.5 - 2.0 mmol/L  Culture, blood (routine x 2)     Status: None (Preliminary result)   Collection Time: 05/19/15  9:00 PM  Result Value Ref Range   Specimen Description BLOOD RIGHT ARM    Special Requests BOTTLES DRAWN AEROBIC AND ANAEROBIC 6CC    Culture PENDING    Report Status PENDING   Culture, blood (routine x 2)     Status: None (Preliminary result)   Collection Time: 05/19/15  9:05 PM  Result Value Ref Range   Specimen Description BLOOD RIGHT HAND    Special Requests BOTTLES DRAWN AEROBIC AND ANAEROBIC 6CC    Culture PENDING    Report Status PENDING   Urinalysis, Routine w reflex microscopic     Status: Abnormal   Collection Time: 05/20/15 12:02 AM  Result Value Ref Range   Color, Urine AMBER (A) YELLOW    Comment: BIOCHEMICALS MAY BE AFFECTED BY COLOR   APPearance CLEAR CLEAR   Specific Gravity, Urine 1.015 1.005 - 1.030   pH 6.5 5.0 - 8.0   Glucose, UA NEGATIVE NEGATIVE mg/dL   Hgb urine dipstick MODERATE (A) NEGATIVE   Bilirubin Urine NEGATIVE NEGATIVE   Ketones, ur NEGATIVE NEGATIVE mg/dL   Protein, ur 30  (A) NEGATIVE mg/dL   Urobilinogen, UA 4.0 (H) 0.0 - 1.0 mg/dL   Nitrite NEGATIVE NEGATIVE   Leukocytes, UA TRACE (A) NEGATIVE  MRSA PCR Screening     Status: None   Collection Time: 05/20/15 12:02 AM  Result Value Ref Range   MRSA by PCR NEGATIVE NEGATIVE    Comment:        The GeneXpert MRSA Assay (FDA approved for NASAL specimens only), is one component of a comprehensive MRSA colonization surveillance program. It is not intended to diagnose MRSA infection nor to guide or monitor treatment for MRSA infections.   Urine microscopic-add on     Status: Abnormal   Collection Time: 05/20/15 12:02 AM  Result Value Ref Range   Squamous Epithelial / LPF MANY (A) RARE   WBC, UA 3-6 <3 WBC/hpf   RBC / HPF 3-6 <3 RBC/hpf   Bacteria, UA MANY (A) RARE  Comprehensive metabolic panel     Status: Abnormal   Collection Time: 05/20/15  5:33 AM  Result Value Ref Range   Sodium 138 135 - 145 mmol/L   Potassium 3.8 3.5 - 5.1 mmol/L   Chloride 104 101 - 111 mmol/L   CO2 28 22 - 32 mmol/L   Glucose, Bld 102 (H) 65 - 99 mg/dL  BUN 20 6 - 20 mg/dL   Creatinine, Ser 1.09 0.61 - 1.24 mg/dL   Calcium 8.9 8.9 - 10.3 mg/dL   Total Protein 8.2 (H) 6.5 - 8.1 g/dL   Albumin 3.4 (L) 3.5 - 5.0 g/dL   AST 21 15 - 41 U/L   ALT 13 (L) 17 - 63 U/L   Alkaline Phosphatase 48 38 - 126 U/L   Total Bilirubin 3.8 (H) 0.3 - 1.2 mg/dL   GFR calc non Af Amer >60 >60 mL/min   GFR calc Af Amer >60 >60 mL/min    Comment: (NOTE) The eGFR has been calculated using the CKD EPI equation. This calculation has not been validated in all clinical situations. eGFR's persistently <60 mL/min signify possible Chronic Kidney Disease.    Anion gap 6 5 - 15  Brain natriuretic peptide     Status: Abnormal   Collection Time: 05/20/15  5:33 AM  Result Value Ref Range   B Natriuretic Peptide 382.0 (H) 0.0 - 100.0 pg/mL  Protime-INR     Status: Abnormal   Collection Time: 05/20/15  5:33 AM  Result Value Ref Range    Prothrombin Time 19.3 (H) 11.6 - 15.2 seconds   INR 1.62 (H) 0.00 - 1.49  CBC     Status: Abnormal   Collection Time: 05/20/15  5:33 AM  Result Value Ref Range   WBC 13.2 (H) 4.0 - 10.5 K/uL   RBC 4.13 (L) 4.22 - 5.81 MIL/uL   Hemoglobin 13.1 13.0 - 17.0 g/dL   HCT 40.6 39.0 - 52.0 %   MCV 98.3 78.0 - 100.0 fL   MCH 31.7 26.0 - 34.0 pg   MCHC 32.3 30.0 - 36.0 g/dL   RDW 14.7 11.5 - 15.5 %   Platelets 136 (L) 150 - 400 K/uL  Basic metabolic panel     Status: Abnormal   Collection Time: 05/20/15  7:41 AM  Result Value Ref Range   Sodium 138 135 - 145 mmol/L   Potassium 3.7 3.5 - 5.1 mmol/L   Chloride 104 101 - 111 mmol/L   CO2 29 22 - 32 mmol/L   Glucose, Bld 105 (H) 65 - 99 mg/dL   BUN 20 6 - 20 mg/dL   Creatinine, Ser 1.12 0.61 - 1.24 mg/dL   Calcium 8.6 (L) 8.9 - 10.3 mg/dL   GFR calc non Af Amer 60 (L) >60 mL/min   GFR calc Af Amer >60 >60 mL/min    Comment: (NOTE) The eGFR has been calculated using the CKD EPI equation. This calculation has not been validated in all clinical situations. eGFR's persistently <60 mL/min signify possible Chronic Kidney Disease.    Anion gap 5 5 - 15    Dg Chest 2 View  05/19/2015   CLINICAL DATA:  Shortness of breath, cough.  EXAM: CHEST  2 VIEW  COMPARISON:  July 05, 2013.  FINDINGS: The heart size and mediastinal contours are within normal limits. Diffusely increased interstitial densities are noted throughout both lungs most consistent with edema. There is interval development of mild right pleural effusion. No pneumothorax is noted. The visualized skeletal structures are unremarkable.  IMPRESSION: Diffusely increased interstitial densities are noted bilaterally consistent with pulmonary edema. Mild right pleural effusion is noted as well.   Electronically Signed   By: Marijo Conception, M.D.   On: 05/19/2015 14:46    ROS  See HPI Eyes: Negative Ears:positive for hearing loss Cardiovascular: Negative for chest pain,  near-syncope,and  syncope, claudication, cyanosis,.  Respiratory:   Positive for cough, shortness of breath and wheezing.   Endocrine: Negative for cold intolerance and heat intolerance.  Hematologic/Lymphatic: Negative for adenopathy and bleeding problem. Does not bruise/bleed easily.  Musculoskeletal: arthritis   Gastrointestinal: positive GERDNegative for nausea, vomiting, abdominal pain, diarrhea, constipation.   Genitourinary: Negative for bladder incontinence, dysuria, flank pain, frequency, hematuria, hesitancy, nocturia and urgency.  Neurological: Negative.  Allergic/Immunologic: Negative for environmental allergies.  Blood pressure 120/64, pulse 133, temperature 99 F (37.2 C), temperature source Oral, resp. rate 20, height _0  (1.727 m), weight 196 lb 6.4 oz (89.086 kg), SpO2 97 %. Physical Exam  PHYSICAL EXAM: Well-nournished, in no acute distress. Neck: Murmur portrayed vs bruit bilaterally No JVD, HJR, or thyroid enlargement Lungs: Decreased breath sounds with rales at bases. Cardiovascular: Irreg irreg with 2/6 harsh systolic murmur LSB, no gallops, bruit, thrill, or heave. Abdomen: BS normal. Soft without organomegaly, masses, lesions or tenderness. Extremities: without cyanosis, clubbing or edema. Good distal pulses bilateral SKin: Warm, no lesions or rashes  Musculoskeletal: No deformities Neuro: no focal signs  2Decho 2014 Study Conclusions  - Left ventricle: The cavity size was normal. There was mild   concentric hypertrophy. Systolic function was vigorous.   The estimated ejection fraction was in the range of 70% to   75%. The study is not technically sufficient to allow   evaluation of LV diastolic function. - Aortic valve: Trileaflet; mildly calcified leaflets. Cusp   separation was mildly reduced. There was very mild   stenosis. Mean gradient: 43m Hg (S). - Mitral valve: Calcified annulus. Trivial regurgitation. - Left atrium: The atrium was moderately dilated. - Right  ventricle: The cavity size was mildly dilated.   Systolic function was normal. - Right atrium: Central venous pressure: 158mHg (est). - Tricuspid valve: Trivial regurgitation. - Pulmonary arteries: Systolic pressure could not be   accurately estimated. - Pericardium, extracardiac: There was no pericardial   effusion. Impressions:  - Compared to previous study February 2013 LV and RV   function are stable. Diastolic function not adequatedly   assessed at this time - had prior evidence of increased LA   filling pressures. Mildly calcified aortic valve with very   mild aortic stenosis.   Assessment/Plan: Probable diastolic CHF: Will check 2Decho to reassess LV function and AS.Continue IV Lasix and watch intake/output.  COPD exacerbation on IV Levaquin.  CAF rates 75-150. On Digoxin 0.125 mg/Diltiazem 300 mg daily, Coumadin. Rate better now. Probably exacerbated by fever/infection.  Mild AS 2014-reassess.  HTN: BP up and down.  MiErmalinda BarriosA-C  05/20/2015, 10:05 AM    Attending note:  Patient seen and examined. Agree with above assessment by Ms. LeBonnell PublicA-C. Mr. HoDhondtresents with recently increased shortness of breath and cough, also fevers. He is being treated for COPD exacerbation. Chest x-ray also shows interstitial edema and a small right pleural effusion, raising question of associated diastolic heart failure. He does have a history of chronic atrial fibrillation with increased heart rates recently, on standard medical therapy as detailed above. Also history of mild aortic stenosis as of 2014, at which time LVEF was vigorous. On examination he appears comfortable, lungs exhibit coarse breath sounds and rhonchi, cardiac exam with irregularly irregular rhythm and 2-6-2/3ystolic murmur. Plan at this time is to continue IV diuresis and follow up on echocardiogram for reassessment of cardiac structure and function.  SaSatira SarkM.D., F.A.C.C.

## 2015-05-21 ENCOUNTER — Encounter (HOSPITAL_COMMUNITY): Payer: Self-pay | Admitting: Internal Medicine

## 2015-05-21 DIAGNOSIS — I350 Nonrheumatic aortic (valve) stenosis: Secondary | ICD-10-CM

## 2015-05-21 HISTORY — DX: Nonrheumatic aortic (valve) stenosis: I35.0

## 2015-05-21 LAB — CBC
HCT: 37.9 % — ABNORMAL LOW (ref 39.0–52.0)
Hemoglobin: 12.2 g/dL — ABNORMAL LOW (ref 13.0–17.0)
MCH: 31.6 pg (ref 26.0–34.0)
MCHC: 32.2 g/dL (ref 30.0–36.0)
MCV: 98.2 fL (ref 78.0–100.0)
Platelets: 119 10*3/uL — ABNORMAL LOW (ref 150–400)
RBC: 3.86 MIL/uL — AB (ref 4.22–5.81)
RDW: 14.8 % (ref 11.5–15.5)
WBC: 7.5 10*3/uL (ref 4.0–10.5)

## 2015-05-21 LAB — PROTIME-INR
INR: 1.82 — ABNORMAL HIGH (ref 0.00–1.49)
Prothrombin Time: 21 seconds — ABNORMAL HIGH (ref 11.6–15.2)

## 2015-05-21 LAB — BASIC METABOLIC PANEL
Anion gap: 11 (ref 5–15)
BUN: 27 mg/dL — AB (ref 6–20)
CHLORIDE: 99 mmol/L — AB (ref 101–111)
CO2: 28 mmol/L (ref 22–32)
Calcium: 8.6 mg/dL — ABNORMAL LOW (ref 8.9–10.3)
Creatinine, Ser: 1.26 mg/dL — ABNORMAL HIGH (ref 0.61–1.24)
GFR calc non Af Amer: 52 mL/min — ABNORMAL LOW (ref >60)
GFR, EST AFRICAN AMERICAN: 60 mL/min — AB (ref >60)
GLUCOSE: 86 mg/dL (ref 65–99)
POTASSIUM: 3.9 mmol/L (ref 3.5–5.1)
Sodium: 138 mmol/L (ref 135–145)

## 2015-05-21 MED ORDER — LEVOFLOXACIN 750 MG PO TABS
750.0000 mg | ORAL_TABLET | Freq: Every day | ORAL | Status: DC
  Administered 2015-05-21: 750 mg via ORAL
  Filled 2015-05-21 (×2): qty 1

## 2015-05-21 MED ORDER — FUROSEMIDE 40 MG PO TABS
40.0000 mg | ORAL_TABLET | Freq: Every day | ORAL | Status: DC
  Administered 2015-05-21 – 2015-05-22 (×2): 40 mg via ORAL
  Filled 2015-05-21 (×2): qty 1

## 2015-05-21 MED ORDER — TAMSULOSIN HCL 0.4 MG PO CAPS
0.4000 mg | ORAL_CAPSULE | Freq: Every day | ORAL | Status: DC
  Administered 2015-05-21 – 2015-05-24 (×4): 0.4 mg via ORAL
  Filled 2015-05-21 (×4): qty 1

## 2015-05-21 MED ORDER — BUDESONIDE 0.25 MG/2ML IN SUSP
0.2500 mg | Freq: Two times a day (BID) | RESPIRATORY_TRACT | Status: DC
  Administered 2015-05-21 – 2015-05-25 (×9): 0.25 mg via RESPIRATORY_TRACT
  Filled 2015-05-21 (×18): qty 2

## 2015-05-21 MED ORDER — PNEUMOCOCCAL VAC POLYVALENT 25 MCG/0.5ML IJ INJ
0.5000 mL | INJECTION | INTRAMUSCULAR | Status: AC
  Administered 2015-05-22: 0.5 mL via INTRAMUSCULAR
  Filled 2015-05-21 (×2): qty 0.5

## 2015-05-21 MED ORDER — WARFARIN SODIUM 5 MG PO TABS
6.0000 mg | ORAL_TABLET | Freq: Once | ORAL | Status: AC
  Administered 2015-05-21: 6 mg via ORAL
  Filled 2015-05-21 (×2): qty 1

## 2015-05-21 NOTE — Progress Notes (Signed)
[place] for Warfarin Indication: atrial fibrillation  Allergies  Allergen Reactions  . Cymbalta [Duloxetine Hcl] Other (See Comments)    Confusion   . Procaine Hcl Hives and Other (See Comments)    NOVOCAINE: Sweating, Confusion, Not in right state of mind.  [name]    unknown   Patient Measurements: Height: 5\' 8"  (172.7 cm) Weight: 196 lb 9.6 oz (89.177 kg) IBW/kg (Calculated) : 68.4  Vital Signs: Temp: 98.2 F (36.8 C) (05/25 2223) Temp Source: Oral (05/26 0603) BP: 106/77 mmHg (05/26 0603) Pulse Rate: 87 (05/26 0603)  Labs:  Recent Labs  05/19/15 1545 05/20/15 0533 05/20/15 0741 05/21/15 0613  HGB 12.7* 13.1  --  12.2*  HCT 38.7* 40.6  --  37.9*  PLT 104* 136*  --  119*  LABPROT 19.8* 19.3*  --  21.0*  INR 1.68* 1.62*  --  1.82*  CREATININE 1.09 1.09 1.12 1.26*  TROPONINI <0.03  --   --   --    Estimated Creatinine Clearance: 49.9 mL/min (by C-G formula based on Cr of 1.26).  Medical History: Past Medical History  Diagnosis Date  . COPD (chronic obstructive pulmonary disease)     Uses occasional nighttime O2  . Disseminated herpes zoster 2010  . GERD (gastroesophageal reflux disease)   . Asthma   . Pulmonary embolus     2003 and 2011  . Pulmonary nodules     Chest CT 09/11  . Mixed hyperlipidemia   . Chronic atrial fibrillation   . Lupus anticoagulant positive   . Essential hypertension, benign   . Cholelithiasis   . Rheumatoid arthritis(714.0)   . History of chicken pox 1941; 2011  . Anemia   . Chronic lower back pain   . Chronic diastolic heart failure   . Cirrhosis     Questionable, AFP normal Feb 01/2012, hx of ETOH use   . Headache(784.0)   . Cervical spondylosis without myelopathy   . Stage III chronic kidney disease   . Cellulitis of leg, left 01/03/2015   Medications:  Prescriptions prior to admission  Medication Sig Dispense Refill Last Dose  . albuterol  (PROAIR HFA) 108 (90 BASE) MCG/ACT inhaler Inhale 2 puffs into the lungs every 6 (six) hours as needed. Shortness of Breath   05/18/2015 at Unknown time  . albuterol (PROVENTIL) (2.5 MG/3ML) 0.083% nebulizer solution Take 2.5 mg by nebulization every 6 (six) hours as needed. Shortness of Breath   Past Month at Unknown time  . cetirizine (ZYRTEC) 10 MG tablet Take 10 mg by mouth daily.     05/18/2015 at Unknown time  . digoxin (LANOXIN) 0.125 MG tablet Take 0.125 mg by mouth daily.   05/19/2015 at Unknown time  . diltiazem (CARDIZEM CD) 300 MG 24 hr capsule Take 1 capsule (300 mg total) by mouth daily. (Patient taking differently: Take 300 mg by mouth every evening. ) 30 capsule 11 05/18/2015 at Unknown time  . Etanercept (ENBREL) 25 MG/0.5ML SOSY Inject into the skin 2 (two) times a week.    05/15/2015  . ferrous sulfate 325 (65 FE) MG tablet Take 325 mg by mouth daily.   05/19/2015 at Unknown time  . folic acid (FOLVITE) 1 MG tablet Take 1 mg by mouth daily.    05/19/2015 at Unknown time  . gabapentin (NEURONTIN) 300 MG capsule TAKE ONE CAPSULE BY MOUTH EVERY MORNING AND TWO CAPSULES IN THE EVENING (Patient taking differently: TAKE TWO CAPSULES  BY MOUTH TWICE DAILY) 30 capsule 0 05/19/2015 at Unknown time  . HYDROcodone-acetaminophen (NORCO) 10-325 MG per tablet Take 1 tablet by mouth 2 (two) times daily. FOR HEADACHE PAIN   05/19/2015 at Unknown time  . ketoconazole (NIZORAL) 2 % shampoo Apply 1 application topically 3 (three) times a week.    Past Week at Unknown time  . methotrexate (RHEUMATREX) 2.5 MG tablet Take 7.5 mg by mouth 2 (two) times a week. *TAKE 3 TABLETS BY MOUTH TWICE WEEKLY ON FRIDAYS AND SATURDAYS*   05/16/2015  . mometasone (NASONEX) 50 MCG/ACT nasal spray Place 2 sprays into the nose daily.   05/19/2015 at Unknown time  . omeprazole (PRILOSEC) 40 MG capsule Take 40 mg by mouth daily.   05/19/2015 at Unknown time  . oxyCODONE-acetaminophen (PERCOCET) 10-325 MG per tablet Take 1 tablet by mouth  every 6 (six) hours as needed for pain.   05/19/2015 at Unknown time  . potassium chloride SA (K-DUR,KLOR-CON) 20 MEQ tablet Take 20 mEq by mouth 2 (two) times daily.    05/19/2015 at Unknown time  . tadalafil (CIALIS) 5 MG tablet Take 5 mg by mouth daily.   UNKNOWN  . Vitamin D, Ergocalciferol, (DRISDOL) 50000 UNITS CAPS capsule Take 1 capsule by mouth once a week.  4 Past Week at Unknown time  . warfarin (COUMADIN) 2 MG tablet Take 5 mg by mouth See admin instructions. No medication on Wednesday. 5 on al other days   05/18/2015 at 2000  . docusate sodium 100 MG CAPS Take 100 mg by mouth daily as needed for mild constipation. (Patient not taking: Reported on 05/19/2015) 10 capsule 0   . furosemide (LASIX) 40 MG tablet Take 1 tablet (40 mg total) by mouth daily. *Sometimes takes two tablets in the morning and one tablet in the evening as regimen depending on fluid retention (Patient not taking: Reported on 05/19/2015) 30 tablet 0   . Omega-3 Fatty Acids (FISH OIL) 1000 MG CAPS Take 2 capsules by mouth 2 (two) times daily.    Not Taking at Unknown time  . spironolactone (ALDACTONE) 25 MG tablet Take 0.5 tablets (12.5 mg total) by mouth daily. (Patient not taking: Reported on 05/19/2015) 30 tablet 0    Assessment: 81 yo M on chronic Coumadin for afib.  Home dose listed above.  INR below goal on admission.  INR remains sub-therapeutic, but rising to goal range.   He was started on Levaquin this admission which can elevate INR response. No bleeding noted.   Goal of Therapy:  INR 2-3   Plan:  Warfarin 6mg  PO x 1 (since INR below goal) Daily PT/INR Change Levaquin to PO (see P&T policy below)  Trinton Prewitt, Lavonia Drafts 05/21/2015,8:17 AM  PHARMACIST - PHYSICIAN COMMUNICATION CONCERNING: Antibiotic IV to Oral Route Change Policy  RECOMMENDATION: This patient is receiving Levaquin by the intravenous route.  Based on criteria approved by the Pharmacy and Therapeutics Committee, the antibiotic(s)  is/are being converted to the equivalent oral dose form(s).   DESCRIPTION: These criteria include:  Patient being treated for a respiratory tract infection, urinary tract infection, cellulitis or clostridium difficile associated diarrhea if on metronidazole  The patient is not neutropenic and does not exhibit a GI malabsorption state  The patient is eating (either orally or via tube) and/or has been taking other orally administered medications for a least 24 hours  The patient is improving clinically and has a Tmax < 100.5  If you have questions about this conversion, please contact the  Pharmacy Department  [x]   [phone] )  [facility] []   [phone] )  [facility] []   [phone] )  [facility] []   [phone] )  [facility] []   [phone] )  [facility]

## 2015-05-21 NOTE — Progress Notes (Addendum)
Consulting cardiologist: Rozann Lesches MD Primary Cardiologist: Rozann Lesches MD  Cardiology Specific Problem List:  1. Diastolic CHF 2. Chronic atrial fibrillation 3. Hypertension 4. AS  Subjective:    Complaints of continued coughing, non productive, but otherwise feels better.  Objective:   Temp:  [97.9 F (36.6 C)-98.2 F (36.8 C)] 98.2 F (36.8 C) (05/25 2223) Pulse Rate:  [80-121] 87 (05/26 0603) Resp:  [18-20] 18 (05/26 0603) BP: (100-126)/(49-90) 106/77 mmHg (05/26 0603) SpO2:  [95 %-100 %] 96 % (05/26 0736) Weight:  [196 lb 9.6 oz (89.177 kg)] 196 lb 9.6 oz (89.177 kg) (05/26 0603) Last BM Date: 05/20/15  Filed Weights   05/19/15 1944 05/20/15 0506 05/21/15 0603  Weight: 195 lb 14.4 oz (88.86 kg) 196 lb 6.4 oz (89.086 kg) 196 lb 9.6 oz (89.177 kg)    Intake/Output Summary (Last 24 hours) at 05/21/15 0845 Last data filed at 05/21/15 1194  Gross per 24 hour  Intake    510 ml  Output    300 ml  Net    210 ml   Telemetry: Atrial fibrillation rates in the 90's - 100's.   Exam:  General: No acute distress.  Lungs: Rhonchi, mild bibasilar crackles  Cardiac: No elevated JVP or bruits. IRRR, 1-7/4 systolic murmur radiating to the abdomen and carotids.  no gallop or rub.   Abdomen: Normoactive bowel sounds, nontender, nondistended.  Extremities: No pitting edema, distal pulses full.   Lab Results:  Basic Metabolic Panel:  Recent Labs Lab 05/20/15 0533 05/20/15 0741 05/21/15 0613  NA 138 138 138  K 3.8 3.7 3.9  CL 104 104 99*  CO2 28 29 28   GLUCOSE 102* 105* 86  BUN 20 20 27*  CREATININE 1.09 1.12 1.26*  CALCIUM 8.9 8.6* 8.6*    CBC:  Recent Labs Lab 05/19/15 1545 05/20/15 0533 05/21/15 0613  WBC 11.2* 13.2* 7.5  HGB 12.7* 13.1 12.2*  HCT 38.7* 40.6 37.9*  MCV 98.5 98.3 98.2  PLT 104* 136* 119*    Cardiac Enzymes:  Recent Labs Lab 05/19/15 1545  TROPONINI <0.03    Coagulation:  Recent Labs Lab 05/19/15 1545  05/20/15 0533 05/21/15 0613  INR 1.68* 1.62* 1.82*   Echocardiogram 05/20/2015 Left ventricle: The cavity size was normal. Wall thickness was increased in a pattern of mild LVH. Systolic function was vigorous. The estimated ejection fraction was in the range of 65% to 70%. Wall motion was normal; there were no regional wall motion abnormalities. The study is not technically sufficient to allow evaluation of LV diastolic function. - Aortic valve: There was moderate to severe stenosis. Mean gradient (S): 23 mm Hg. Peak gradient (S): 54 mm Hg. VTI ratio of LVOT to aortic valve: 0.34. Valve area (VTI): 1.07 cm^2. Valve area (Vmax): 1.08 cm^2. Valve area (Vmean): 1.21 cm^2. - Mitral valve: Calcified annulus. Mildly calcified leaflets . Mean gradient (D): 4 mm Hg. Valve area by pressure half-time: 2.24 cm^2. Valve area by continuity equation (using LVOT flow): 1.67 cm^2. - Left atrium: The atrium was moderately dilated. - Right atrium: Central venous pressure (est): 3 mm Hg. - Atrial septum: There was increased thickness of the septum, consistent with lipomatous hypertrophy. No defect or patent foramen ovale was identified. - Tricuspid valve: There was trivial regurgitation. - Pulmonary arteries: Systolic pressure could not be accurately estimated. - Pericardium, extracardiac: There was no pericardial effusion.  Radiology: Dg Chest 2 View  05/19/2015   CLINICAL DATA:  Shortness of breath, cough.  EXAM:  CHEST  2 VIEW  COMPARISON:  July 05, 2013.  FINDINGS: The heart size and mediastinal contours are within normal limits. Diffusely increased interstitial densities are noted throughout both lungs most consistent with edema. There is interval development of mild right pleural effusion. No pneumothorax is noted. The visualized skeletal structures are unremarkable.  IMPRESSION: Diffusely increased interstitial densities are noted bilaterally consistent with pulmonary  edema. Mild right pleural effusion is noted as well.   Electronically Signed   By: Marijo Conception, M.D.   On: 05/19/2015 14:46     Medications:   Scheduled Medications: . benzonatate  100 mg Oral TID  . digoxin  0.125 mg Oral Daily  . diltiazem  300 mg Oral Daily  . ferrous sulfate  325 mg Oral Daily  . fluticasone  2 spray Each Nare Daily  . folic acid  1 mg Oral Daily  . furosemide  60 mg Intravenous Daily  . gabapentin  600 mg Oral BID  . ipratropium  0.5 mg Nebulization Q6H  . ketoconazole  1 application Topical Once per day on Mon Wed Fri  . levalbuterol  0.63 mg Nebulization Q6H  . levofloxacin  750 mg Oral Daily  . loratadine  10 mg Oral Daily  . omega-3 acid ethyl esters  1 g Oral Daily  . pantoprazole  80 mg Oral Daily  . potassium chloride SA  20 mEq Oral BID  . sodium chloride  3 mL Intravenous Q12H  . Vitamin D (Ergocalciferol)  50,000 Units Oral Weekly  . warfarin  6 mg Oral Once  . Warfarin - Pharmacist Dosing Inpatient   Does not apply Q24H    Infusions: . sodium chloride 10 mL/hr at 05/19/15 2153    PRN Medications: acetaminophen, albuterol, chlorpheniramine-HYDROcodone, diclofenac sodium, ondansetron **OR** ondansetron (ZOFRAN) IV, oxyCODONE-acetaminophen **AND** oxyCODONE   Assessment and Plan:   1. Acute on Chronic Diastolic CHF: Intake and output even on Lasix 60 mg IV daily. Wt is essentially unchanged. Echo demonstrates normal EF. No edema in the LE. Review of home medications has him on 40 mg daily of lasix with extra when edema worsens. Creatinine this am up to 1.26. Will transition back to po lasix.  2. Atrial fib: Heart rate running 90's-100;s. Currently on digoxin 1.25 mg daily; diltiazem 300 mg daily, and coumadin. May need to consider increasing HR control medication. BP is low normal. Will discuss with Dr. Domenic Polite about rate control options.   3. Ao Valve Stenosis:  Now progressed to moderate/severe. Rate control will be efficacious at this  point.   Phill Myron. Lawrence NP [place]  05/21/2015, 8:45 AM    Attending note:  Patient seen and examined. Agree with above assessment by Ms. Lawrence NP. Mr. Waddington feels better overall, but still coughing. No chest pain or palpitations. Intake and output even. On examination he appears comfortable, lungs exhibit rhonchi and decreased breath sounds, cor with irregularly irregular rhythm and 3-1/5 systolic murmur. Echocardiogram shows preserved LVEF with progressive aortic stenosis compared to prior study, now moderate to severe range. Doubt that this is contributing to current symptoms however, but will need to be followed more closely. Follow heart rate for now, we might consider increasing Cardizem CD to 360 mg daily if needed.  Satira Sark, M.D., F.A.C.C.

## 2015-05-21 NOTE — Progress Notes (Signed)
[facility] PROGRESS NOTE  [name] NOI:[id number] DOB: [birth year] DOA: 05/19/2015 PCP: [name], MD    Code Status: Full code: Full code Family Communication: Discussed with patient, family not available Disposition Plan: Discharge when clinically appropriate   Consultants:  Cardiology  Procedures:  2-D echocardiogram:Study Conclusions - Left ventricle: The cavity size was normal. Wall thickness was increased in a pattern of mild LVH. Systolic function was vigorous. The estimated ejection fraction was in the range of 65% to 70%. Wall motion was normal; there were no regional wall motion abnormalities. The study is not technically sufficient to allow evaluation of LV diastolic function. - Aortic valve: There was moderate to severe stenosis. Mean gradient (S): 23 mm Hg. Peak gradient (S): 54 mm Hg. VTI ratio of LVOT to aortic valve: 0.34. Valve area (VTI): 1.07 cm^2. Valve area (Vmax): 1.08 cm^2. Valve area (Vmean): 1.21 cm^2. - Mitral valve: Calcified annulus. Mildly calcified leaflets . Mean gradient (D): 4 mm Hg. Valve area by pressure half-time: 2.24 cm^2. Valve area by continuity equation (using LVOT flow): 1.67 cm^2. - Left atrium: The atrium was moderately dilated. - Right atrium: Central venous pressure (est): 3 mm Hg. - Atrial septum: There was increased thickness of the septum, consistent with lipomatous hypertrophy. No defect or patent foramen ovale was identified. - Tricuspid valve: There was trivial regurgitation. - Pulmonary arteries: Systolic pressure could not be accurately estimated. - Pericardium, extracardiac: There was no pericardial effusion. Impressions: - Mild LVH with LVEF 65-70%, indeterminate diastolic function. Moderate left atrial enlargement. Moderate to severe calcific aortic stenosis as outlined above - there has been progression compared to the prior study. Trivial tricuspid  regurgitation, unable to assess PASP.  Antibiotics:  Levaquin 5/24>>  HPI/Subjective: Patient says he feels about the same, but admits upon further questioning, that he is breathing a little better. Tessalon Perles are helping his cough a little. He denies chest pain.  Objective: Filed Vitals:   05/21/15 0603  BP: 106/77  Pulse: 87  Temp:   Resp: 18   oxygen saturation 96%. Temperature 98.2.  Intake/Output Summary (Last 24 hours) at 05/21/15 1100 Last data filed at 05/21/15 0800  Gross per 24 hour  Intake    750 ml  Output    900 ml  Net   -150 ml   Filed Weights   05/19/15 1944 05/20/15 0506 05/21/15 0603  Weight: 88.86 kg (195 lb 14.4 oz) 89.086 kg (196 lb 6.4 oz) 89.177 kg (196 lb 9.6 oz)    Exam:   General:  [name] in no acute distress.  Cardiovascular: Irregular, irregular, with mild tachycardia and a 2 to 3/6 systolic murmur.  Respiratory: Less rhonchus wheezing/crackles compared to yesterday.  Abdomen: Positive bowel sounds, soft, nontender, non-distended.  Musculoskeletal/extremities: No pedal edema. No acute hot red joints.  Neurologic: He is alert and oriented 2. Cranial nerves II through XII are grossly intact.   Data Reviewed: Basic Metabolic Panel:  Recent Labs Lab 05/19/15 1545 05/20/15 0533 05/20/15 0741 05/21/15 0613  NA 139 138 138 138  K 3.9 3.8 3.7 3.9  CL 103 104 104 99*  CO2 27 28 29 28   GLUCOSE 100* 102* 105* 86  BUN 18 20 20  27*  CREATININE 1.09 1.09 1.12 1.26*  CALCIUM 8.9 8.9 8.6* 8.6*   Liver Function Tests:  Recent Labs Lab 05/19/15 1545 05/20/15 0533  AST 23 21  ALT 14* 13*  ALKPHOS 48 48  BILITOT 3.3* 3.8*  PROT 8.2* 8.2*  ALBUMIN 3.6 3.4*   No results for input(s): LIPASE, AMYLASE in the last 168 hours. No results for input(s): AMMONIA in the last 168 hours. CBC:  Recent Labs Lab 05/19/15 1545 05/20/15 0533 05/21/15 0613  WBC 11.2* 13.2* 7.5  HGB 12.7* 13.1 12.2*  HCT 38.7* 40.6  37.9*  MCV 98.5 98.3 98.2  PLT 104* 136* 119*   Cardiac Enzymes:  Recent Labs Lab 05/19/15 1545  TROPONINI <0.03   BNP (last 3 results)  Recent Labs  05/19/15 1545 05/20/15 0533  BNP 413.0* 382.0*    ProBNP (last 3 results) No results for input(s): PROBNP in the last 8760 hours.  CBG: No results for input(s): GLUCAP in the last 168 hours.  Recent Results (from the past 240 hour(s))  Culture, blood (routine x 2)     Status: None (Preliminary result)   Collection Time: 05/19/15  9:00 PM  Result Value Ref Range Status   Specimen Description BLOOD RIGHT ARM  Final   Special Requests BOTTLES DRAWN AEROBIC AND ANAEROBIC 6CC  Final   Culture NO GROWTH 2 DAYS  Final   Report Status PENDING  Incomplete  Culture, blood (routine x 2)     Status: None (Preliminary result)   Collection Time: 05/19/15  9:05 PM  Result Value Ref Range Status   Specimen Description BLOOD RIGHT HAND  Final   Special Requests BOTTLES DRAWN AEROBIC AND ANAEROBIC 6CC  Final   Culture NO GROWTH 2 DAYS  Final   Report Status PENDING  Incomplete  MRSA PCR Screening     Status: None   Collection Time: 05/20/15 12:02 AM  Result Value Ref Range Status   MRSA by PCR NEGATIVE NEGATIVE Final    Comment:        The GeneXpert MRSA Assay (FDA approved for NASAL specimens only), is one component of a comprehensive MRSA colonization surveillance program. It is not intended to diagnose MRSA infection nor to guide or monitor treatment for MRSA infections.      Studies: Dg Chest 2 View  05/19/2015   CLINICAL DATA:  Shortness of breath, cough.  EXAM: CHEST  2 VIEW  COMPARISON:  July 05, 2013.  FINDINGS: The heart size and mediastinal contours are within normal limits. Diffusely increased interstitial densities are noted throughout both lungs most consistent with edema. There is interval development of mild right pleural effusion. No pneumothorax is noted. The visualized skeletal structures are unremarkable.   IMPRESSION: Diffusely increased interstitial densities are noted bilaterally consistent with pulmonary edema. Mild right pleural effusion is noted as well.   Electronically Signed   By: Marijo Conception, M.D.   On: 05/19/2015 14:46    Scheduled Meds: . benzonatate  100 mg Oral TID  . digoxin  0.125 mg Oral Daily  . diltiazem  300 mg Oral Daily  . ferrous sulfate  325 mg Oral Daily  . fluticasone  2 spray Each Nare Daily  . folic acid  1 mg Oral Daily  . furosemide  40 mg Oral Daily  . gabapentin  600 mg Oral BID  . ipratropium  0.5 mg Nebulization Q6H  . ketoconazole  1 application Topical Once per day on Mon Wed Fri  . levalbuterol  0.63 mg Nebulization Q6H  . levofloxacin  750 mg Oral Daily  . loratadine  10 mg Oral Daily  . omega-3 acid ethyl esters  1 g Oral Daily  . pantoprazole  80 mg Oral Daily  . potassium chloride SA  20 mEq  Oral BID  . sodium chloride  3 mL Intravenous Q12H  . Vitamin D (Ergocalciferol)  50,000 Units Oral Weekly  . warfarin  6 mg Oral Once  . Warfarin - Pharmacist Dosing Inpatient   Does not apply Q24H   Continuous Infusions: . sodium chloride 10 mL/hr at 05/19/15 2153   Assessment and plan:  Principal Problem:   COPD exacerbation Active Problems:   Acute on chronic diastolic CHF (congestive heart failure)   Chronic atrial fibrillation   Aortic stenosis, moderate   Essential hypertension, benign   Thrombocytopenia   1. COPD exacerbation. The patient has a history of COPD, non-oxygen dependent. He stopped smoking years ago. He was started on IV Levaquin. Xopenex and Atrovent nebulizers were added subsequently at every 6 hours. When necessary Tussionex and scheduled 3 times a day Tessalon Perles were ordered for his cough. We'll hold on IV steroid treatment as he does not appear to have significant bronchospasms and may likely decrease his immunity further, but will add Pulmicort. On exam, he has fewer rhonchi. He is now afebrile. Blood cultures are  negative to date.  Fever. This is presumed to be secondary to COPD with exacerbation. The patient has some immunocompromise on Enbrel and methotrexate for treatment of rheumatoid arthritis. These will be withheld for now. Blood cultures are negative to date. His influenza panel was negative.  Acute on chronic diastolic heart failure. On admission, the patient's chest x-ray was indicative of some pulmonary edema. The patient was started on IV Lasix. Cardiology was consulted and noted that the patient has a history of diastolic dysfunction with an EF of 70-75% and mild aortic stenosis per previous echo in 2014. Results of the 2-D echocardiogram during this admission revealed indeterminate diastolic function, moderate to severe aortic valve stenosis which has increased, and ejection fraction of 65-70%. Patient's TSH is within normal limits at 1.0. The patient's 1/0 is about even. Cardiology has changed the Lasix to 40 mg orally daily.  Chronic atrial fibrillation; with RVR. His heart rate had increased, in part, due to his recent fever. His heart rate and fever curve has improved. We'll continue diltiazem and digoxin. Continue Coumadin, dosing per pharmacy.  Thrombocytopenia. The patient has a history of thrombocytopenia with a baseline ranging from 74-125. Etiology unknown but could be secondary to Enbrel and methotrexate. His TSH was within normal limits. His vitamin B12 level was within normal limits at 507.   Time spent: 30 minutes    [facility] Pager [phone]. If 7PM-7AM, please contact night-coverage at [email or web address], password [facility] 05/21/2015, 11:00 AM  LOS: 2 days

## 2015-05-22 ENCOUNTER — Inpatient Hospital Stay (HOSPITAL_COMMUNITY): Payer: Medicare Other

## 2015-05-22 DIAGNOSIS — I10 Essential (primary) hypertension: Secondary | ICD-10-CM

## 2015-05-22 DIAGNOSIS — I5031 Acute diastolic (congestive) heart failure: Secondary | ICD-10-CM | POA: Insufficient documentation

## 2015-05-22 DIAGNOSIS — J441 Chronic obstructive pulmonary disease with (acute) exacerbation: Secondary | ICD-10-CM | POA: Diagnosis not present

## 2015-05-22 LAB — CBC
HCT: 33.7 % — ABNORMAL LOW (ref 39.0–52.0)
Hemoglobin: 10.9 g/dL — ABNORMAL LOW (ref 13.0–17.0)
MCH: 31.7 pg (ref 26.0–34.0)
MCHC: 32.3 g/dL (ref 30.0–36.0)
MCV: 98 fL (ref 78.0–100.0)
Platelets: 105 10*3/uL — ABNORMAL LOW (ref 150–400)
RBC: 3.44 MIL/uL — ABNORMAL LOW (ref 4.22–5.81)
RDW: 14.7 % (ref 11.5–15.5)
WBC: 7.6 10*3/uL (ref 4.0–10.5)

## 2015-05-22 LAB — BASIC METABOLIC PANEL
ANION GAP: 9 (ref 5–15)
BUN: 22 mg/dL — ABNORMAL HIGH (ref 6–20)
CO2: 27 mmol/L (ref 22–32)
Calcium: 8.4 mg/dL — ABNORMAL LOW (ref 8.9–10.3)
Chloride: 101 mmol/L (ref 101–111)
Creatinine, Ser: 1.32 mg/dL — ABNORMAL HIGH (ref 0.61–1.24)
GFR calc Af Amer: 57 mL/min — ABNORMAL LOW (ref >60)
GFR calc non Af Amer: 49 mL/min — ABNORMAL LOW (ref >60)
Glucose, Bld: 100 mg/dL — ABNORMAL HIGH (ref 65–99)
POTASSIUM: 3.9 mmol/L (ref 3.5–5.1)
SODIUM: 137 mmol/L (ref 135–145)

## 2015-05-22 LAB — PROTIME-INR
INR: 2.15 — ABNORMAL HIGH (ref 0.00–1.49)
Prothrombin Time: 23.8 seconds — ABNORMAL HIGH (ref 11.6–15.2)

## 2015-05-22 MED ORDER — LEVOFLOXACIN 750 MG PO TABS
750.0000 mg | ORAL_TABLET | ORAL | Status: DC
  Administered 2015-05-23 – 2015-05-25 (×2): 750 mg via ORAL
  Filled 2015-05-22 (×3): qty 1

## 2015-05-22 MED ORDER — WARFARIN SODIUM 5 MG PO TABS
5.0000 mg | ORAL_TABLET | Freq: Once | ORAL | Status: AC
  Administered 2015-05-22: 5 mg via ORAL
  Filled 2015-05-22: qty 1

## 2015-05-22 MED ORDER — PREDNISONE 20 MG PO TABS
20.0000 mg | ORAL_TABLET | Freq: Two times a day (BID) | ORAL | Status: DC
  Administered 2015-05-22: 20 mg via ORAL
  Filled 2015-05-22: qty 1

## 2015-05-22 MED ORDER — DILTIAZEM HCL ER COATED BEADS 180 MG PO CP24
360.0000 mg | ORAL_CAPSULE | Freq: Every day | ORAL | Status: DC
  Administered 2015-05-22 – 2015-05-25 (×4): 360 mg via ORAL
  Filled 2015-05-22 (×4): qty 2

## 2015-05-22 NOTE — Progress Notes (Signed)
[facility] PROGRESS NOTE  [name] GYJ:[id number] DOB: 09-07-1931 DOA: 05/19/2015 PCP: [name], MD    Code Status: Full code: Full code Family Communication: Discussed with patient, family not available Disposition Plan: Discharge when clinically appropriate   Consultants:  Cardiology  Procedures:  2-D echocardiogram:Study Conclusions - Left ventricle: The cavity size was normal. Wall thickness was increased in a pattern of mild LVH. Systolic function was vigorous. The estimated ejection fraction was in the range of 65% to 70%. Wall motion was normal; there were no regional wall motion abnormalities. The study is not technically sufficient to allow evaluation of LV diastolic function. - Aortic valve: There was moderate to severe stenosis. Mean gradient (S): 23 mm Hg. Peak gradient (S): 54 mm Hg. VTI ratio of LVOT to aortic valve: 0.34. Valve area (VTI): 1.07 cm^2. Valve area (Vmax): 1.08 cm^2. Valve area (Vmean): 1.21 cm^2. - Mitral valve: Calcified annulus. Mildly calcified leaflets . Mean gradient (D): 4 mm Hg. Valve area by pressure half-time: 2.24 cm^2. Valve area by continuity equation (using LVOT flow): 1.67 cm^2. - Left atrium: The atrium was moderately dilated. - Right atrium: Central venous pressure (est): 3 mm Hg. - Atrial septum: There was increased thickness of the septum, consistent with lipomatous hypertrophy. No defect or patent foramen ovale was identified. - Tricuspid valve: There was trivial regurgitation. - Pulmonary arteries: Systolic pressure could not be accurately estimated. - Pericardium, extracardiac: There was no pericardial effusion. Impressions: - Mild LVH with LVEF 65-70%, indeterminate diastolic function. Moderate left atrial enlargement. Moderate to severe calcific aortic stenosis as outlined above - there has been progression compared to the prior study. Trivial tricuspid  regurgitation, unable to assess PASP.  Antibiotics:  Levaquin 5/24>>  HPI/Subjective: Patient says his breathing is better. He denies chest pain.  Objective: Filed Vitals:   05/22/15 1133  BP:   Pulse: 88  Temp:   Resp:    temperature 98.2. Pulse 88. Respiratory rate 18. Blood pressure 123/63. Oxygen saturation 90-95%.  Intake/Output Summary (Last 24 hours) at 05/22/15 1419 Last data filed at 05/22/15 1200  Gross per 24 hour  Intake    843 ml  Output    700 ml  Net    143 ml   Filed Weights   05/20/15 0506 05/21/15 0603 05/22/15 0500  Weight: 89.086 kg (196 lb 6.4 oz) 89.177 kg (196 lb 9.6 oz) 90.13 kg (198 lb 11.2 oz)    Exam:   General:  [name] in no acute distress.  Cardiovascular: Irregular, irregular, with mild tachycardia and a 2 to 3/6 systolic murmur.  Respiratory: Mild scattered wheezes; resolution of rhonchi.  Abdomen: Positive bowel sounds, soft, nontender, non-distended.  Musculoskeletal/extremities: No pedal edema. No acute hot red joints.  Neurologic: He is alert and oriented 2. Cranial nerves II through XII are grossly intact.   Data Reviewed: Basic Metabolic Panel:  Recent Labs Lab 05/19/15 1545 05/20/15 0533 05/20/15 0741 05/21/15 0613 05/22/15 0553  NA 139 138 138 138 137  K 3.9 3.8 3.7 3.9 3.9  CL 103 104 104 99* 101  CO2 27 28 29 28 27   GLUCOSE 100* 102* 105* 86 100*  BUN 18 20 20  27* 22*  CREATININE 1.09 1.09 1.12 1.26* 1.32*  CALCIUM 8.9 8.9 8.6* 8.6* 8.4*   Liver Function Tests:  Recent Labs Lab 05/19/15 1545 05/20/15 0533  AST 23 21  ALT 14* 13*  ALKPHOS 48 48  BILITOT 3.3* 3.8*  PROT 8.2* 8.2*  ALBUMIN 3.6  3.4*   No results for input(s): LIPASE, AMYLASE in the last 168 hours. No results for input(s): AMMONIA in the last 168 hours. CBC:  Recent Labs Lab 05/19/15 1545 05/20/15 0533 05/21/15 0613 05/22/15 0553  WBC 11.2* 13.2* 7.5 7.6  HGB 12.7* 13.1 12.2* 10.9*  HCT 38.7* 40.6 37.9*  33.7*  MCV 98.5 98.3 98.2 98.0  PLT 104* 136* 119* 105*   Cardiac Enzymes:  Recent Labs Lab 05/19/15 1545  TROPONINI <0.03   BNP (last 3 results)  Recent Labs  05/19/15 1545 05/20/15 0533  BNP 413.0* 382.0*    ProBNP (last 3 results) No results for input(s): PROBNP in the last 8760 hours.  CBG: No results for input(s): GLUCAP in the last 168 hours.  Recent Results (from the past 240 hour(s))  Culture, blood (routine x 2)     Status: None (Preliminary result)   Collection Time: 05/19/15  9:00 PM  Result Value Ref Range Status   Specimen Description BLOOD RIGHT ARM  Final   Special Requests BOTTLES DRAWN AEROBIC AND ANAEROBIC 6CC  Final   Culture NO GROWTH 2 DAYS  Final   Report Status PENDING  Incomplete  Culture, blood (routine x 2)     Status: None (Preliminary result)   Collection Time: 05/19/15  9:05 PM  Result Value Ref Range Status   Specimen Description BLOOD RIGHT HAND  Final   Special Requests BOTTLES DRAWN AEROBIC AND ANAEROBIC 6CC  Final   Culture NO GROWTH 2 DAYS  Final   Report Status PENDING  Incomplete  MRSA PCR Screening     Status: None   Collection Time: 05/20/15 12:02 AM  Result Value Ref Range Status   MRSA by PCR NEGATIVE NEGATIVE Final    Comment:        The GeneXpert MRSA Assay (FDA approved for NASAL specimens only), is one component of a comprehensive MRSA colonization surveillance program. It is not intended to diagnose MRSA infection nor to guide or monitor treatment for MRSA infections.      Studies: Dg Chest 2 View  05/22/2015   CLINICAL DATA:  Congestive heart failure  EXAM: CHEST  2 VIEW  COMPARISON:  PA and lateral chest of May 19, 2015  FINDINGS: The lungs remain hyperinflated. There remains a small right pleural effusion. The interstitial markings remain mildly increased but have improved slightly since the earlier study. The cardiac silhouette is top-normal in size. The pulmonary vascularity is less engorged. The  mediastinum is normal in width. The bony thorax exhibits no acute abnormality. There is chronic compression of T4.  IMPRESSION: Improving CHF superimposed upon COPD. There is a stable small right pleural effusion.   Electronically Signed   By: David  Martinique M.D.   On: 05/22/2015 09:25    Scheduled Meds: . benzonatate  100 mg Oral TID  . budesonide  0.25 mg Nebulization BID  . digoxin  0.125 mg Oral Daily  . diltiazem  360 mg Oral Daily  . ferrous sulfate  325 mg Oral Daily  . fluticasone  2 spray Each Nare Daily  . folic acid  1 mg Oral Daily  . furosemide  40 mg Oral Daily  . gabapentin  600 mg Oral BID  . ipratropium  0.5 mg Nebulization Q6H  . ketoconazole  1 application Topical Once per day on Mon Wed Fri  . levalbuterol  0.63 mg Nebulization Q6H  . [START ON 05/23/2015] levofloxacin  750 mg Oral Q48H  . loratadine  10 mg Oral  Daily  . omega-3 acid ethyl esters  1 g Oral Daily  . pantoprazole  80 mg Oral Daily  . pneumococcal 23 valent vaccine  0.5 mL Intramuscular Tomorrow-1000  . potassium chloride SA  20 mEq Oral BID  . sodium chloride  3 mL Intravenous Q12H  . tamsulosin  0.4 mg Oral QHS  . Vitamin D (Ergocalciferol)  50,000 Units Oral Weekly  . warfarin  5 mg Oral Once  . Warfarin - Pharmacist Dosing Inpatient   Does not apply Q24H   Continuous Infusions: . sodium chloride 10 mL/hr at 05/19/15 2153   Assessment and plan:  Principal Problem:   COPD exacerbation Active Problems:   Acute on chronic diastolic CHF (congestive heart failure)   Chronic atrial fibrillation   Aortic stenosis, moderate   Essential hypertension, benign   Thrombocytopenia   1. COPD exacerbation. The patient has a history of COPD, non-oxygen dependent. He stopped smoking years ago. He was started on IV Levaquin. Xopenex and Atrovent nebulizers were added subsequently at every 6 hours. When necessary Tussionex and scheduled 3 times a day Tessalon Perles were ordered for his cough. Pulmicort  nebulizer was added. He did not previously have a wheezing component, but he does have some bronchospasms, so we'll start a small prednisone taper.  He is now afebrile. Blood cultures are negative to date. -We'll ambulate the patient to assess for need for home oxygen.  Fever. This is presumed to be secondary to COPD with exacerbation. The patient has some immunocompromise on Enbrel and methotrexate for treatment of rheumatoid arthritis. We'll continue to hold these. Blood cultures are negative to date. His influenza panel was negative. His fever has resolved.  Acute on chronic diastolic heart failure. On admission, the patient's chest x-ray was indicative of some pulmonary edema. The patient was started on IV Lasix. Cardiology was consulted and noted that the patient has a history of diastolic dysfunction with an EF of 70-75% and mild aortic stenosis per previous echo in 2014. Results of the 2-D echocardiogram during this admission revealed indeterminate diastolic function, moderate to severe aortic valve stenosis which has increased, and ejection fraction of 65-70%. Patient's TSH was within normal limits at 1.0. The patient's in/out is about even. His follow-up chest x-ray revealed a decrease in pulmonary edema.  Cardiology has changed the Lasix to 40 mg orally daily, his home dose.  Chronic atrial fibrillation; with RVR. His heart rate had increased, in part, due to his recent fever. His heart rate and fever curve has improved. We'll continue diltiazem and digoxin. Cardiology increased her diltiazem to 360 mg daily.  Continue Coumadin, dosing per pharmacy.  Thrombocytopenia. The patient has a history of thrombocytopenia with a baseline ranging from 74-125. Etiology unknown but could be secondary to Enbrel and methotrexate. His TSH was within normal limits. His vitamin B12 level was within normal limits at 507.   Time spent: 25 minutes    [facility] Pager  [phone]. If 7PM-7AM, please contact night-coverage at [email or web address], password [facility] 05/22/2015, 2:19 PM  LOS: 3 days

## 2015-05-22 NOTE — Progress Notes (Signed)
[place] for Warfarin Indication: atrial fibrillation  Allergies  Allergen Reactions  . Cymbalta [Duloxetine Hcl] Other (See Comments)    Confusion   . Procaine Hcl Hives and Other (See Comments)    NOVOCAINE: Sweating, Confusion, Not in right state of mind.  [name]    unknown   Patient Measurements: Height: 5\' 8"  (172.7 cm) Weight: 198 lb 11.2 oz (90.13 kg) IBW/kg (Calculated) : 68.4  Vital Signs: Temp: 98.2 F (36.8 C) (05/27 0602) Temp Source: Oral (05/26 2313) BP: 123/63 mmHg (05/27 0602) Pulse Rate: 76 (05/27 0602)  Labs:  Recent Labs  05/19/15 1545 05/20/15 0533 05/20/15 0741 05/21/15 0613 05/22/15 0553  HGB 12.7* 13.1  --  12.2* 10.9*  HCT 38.7* 40.6  --  37.9* 33.7*  PLT 104* 136*  --  119* 105*  LABPROT 19.8* 19.3*  --  21.0* 23.8*  INR 1.68* 1.62*  --  1.82* 2.15*  CREATININE 1.09 1.09 1.12 1.26* 1.32*  TROPONINI <0.03  --   --   --   --    Estimated Creatinine Clearance: 47.9 mL/min (by C-G formula based on Cr of 1.32).  Medical History: Past Medical History  Diagnosis Date  . COPD (chronic obstructive pulmonary disease)     Uses occasional nighttime O2  . Disseminated herpes zoster 2010  . GERD (gastroesophageal reflux disease)   . Asthma   . Pulmonary embolus     2003 and 2011  . Pulmonary nodules     Chest CT 09/11  . Mixed hyperlipidemia   . Chronic atrial fibrillation   . Lupus anticoagulant positive   . Essential hypertension, benign   . Cholelithiasis   . Rheumatoid arthritis(714.0)   . History of chicken pox 1941; 2011  . Anemia   . Chronic lower back pain   . Chronic diastolic heart failure   . Cirrhosis     Questionable, AFP normal Feb 01/2012, hx of ETOH use   . Headache(784.0)   . Cervical spondylosis without myelopathy   . Stage III chronic kidney disease   . Cellulitis of leg, left 01/03/2015  . Aortic stenosis, moderate 05/21/2015    Medications:  Prescriptions prior to admission  Medication Sig Dispense Refill Last Dose  . albuterol (PROAIR HFA) 108 (90 BASE) MCG/ACT inhaler Inhale 2 puffs into the lungs every 6 (six) hours as needed. Shortness of Breath   05/18/2015 at Unknown time  . albuterol (PROVENTIL) (2.5 MG/3ML) 0.083% nebulizer solution Take 2.5 mg by nebulization every 6 (six) hours as needed. Shortness of Breath   Past Month at Unknown time  . cetirizine (ZYRTEC) 10 MG tablet Take 10 mg by mouth daily.     05/18/2015 at Unknown time  . digoxin (LANOXIN) 0.125 MG tablet Take 0.125 mg by mouth daily.   05/19/2015 at Unknown time  . diltiazem (CARDIZEM CD) 300 MG 24 hr capsule Take 1 capsule (300 mg total) by mouth daily. (Patient taking differently: Take 300 mg by mouth every evening. ) 30 capsule 11 05/18/2015 at Unknown time  . Etanercept (ENBREL) 25 MG/0.5ML SOSY Inject into the skin 2 (two) times a week.    05/15/2015  . ferrous sulfate 325 (65 FE) MG tablet Take 325 mg by mouth daily.   05/19/2015 at Unknown time  . folic acid (FOLVITE) 1 MG tablet Take 1 mg by mouth daily.    05/19/2015 at Unknown time  . gabapentin (NEURONTIN) 300 MG capsule TAKE ONE  CAPSULE BY MOUTH EVERY MORNING AND TWO CAPSULES IN THE EVENING (Patient taking differently: TAKE TWO CAPSULES BY MOUTH TWICE DAILY) 30 capsule 0 05/19/2015 at Unknown time  . HYDROcodone-acetaminophen (NORCO) 10-325 MG per tablet Take 1 tablet by mouth 2 (two) times daily. FOR HEADACHE PAIN   05/19/2015 at Unknown time  . ketoconazole (NIZORAL) 2 % shampoo Apply 1 application topically 3 (three) times a week.    Past Week at Unknown time  . methotrexate (RHEUMATREX) 2.5 MG tablet Take 7.5 mg by mouth 2 (two) times a week. *TAKE 3 TABLETS BY MOUTH TWICE WEEKLY ON FRIDAYS AND SATURDAYS*   05/16/2015  . mometasone (NASONEX) 50 MCG/ACT nasal spray Place 2 sprays into the nose daily.   05/19/2015 at Unknown time  . omeprazole (PRILOSEC) 40 MG capsule Take 40 mg by mouth daily.    05/19/2015 at Unknown time  . oxyCODONE-acetaminophen (PERCOCET) 10-325 MG per tablet Take 1 tablet by mouth every 6 (six) hours as needed for pain.   05/19/2015 at Unknown time  . potassium chloride SA (K-DUR,KLOR-CON) 20 MEQ tablet Take 20 mEq by mouth 2 (two) times daily.    05/19/2015 at Unknown time  . tadalafil (CIALIS) 5 MG tablet Take 5 mg by mouth daily.   UNKNOWN  . Vitamin D, Ergocalciferol, (DRISDOL) 50000 UNITS CAPS capsule Take 1 capsule by mouth once a week.  4 Past Week at Unknown time  . warfarin (COUMADIN) 2 MG tablet Take 5 mg by mouth See admin instructions. No medication on Wednesday. 5 on al other days   05/18/2015 at 2000  . docusate sodium 100 MG CAPS Take 100 mg by mouth daily as needed for mild constipation. (Patient not taking: Reported on 05/19/2015) 10 capsule 0   . furosemide (LASIX) 40 MG tablet Take 1 tablet (40 mg total) by mouth daily. *Sometimes takes two tablets in the morning and one tablet in the evening as regimen depending on fluid retention (Patient not taking: Reported on 05/19/2015) 30 tablet 0   . Omega-3 Fatty Acids (FISH OIL) 1000 MG CAPS Take 2 capsules by mouth 2 (two) times daily.    Not Taking at Unknown time  . spironolactone (ALDACTONE) 25 MG tablet Take 0.5 tablets (12.5 mg total) by mouth daily. (Patient not taking: Reported on 05/19/2015) 30 tablet 0    Assessment: 81 yo M on chronic Coumadin for afib.  Home dose listed above.  INR below goal on admission, but is finally in goal range today.    He was started on Levaquin this admission which can elevate INR response.  Scr continues to trend up- estimated CrCl now <65ml/min.  No bleeding noted.   Goal of Therapy:  INR 2-3   Plan:  Warfarin 5mg  PO x 1  Daily PT/INR Decrease Levaquin to q48h for CrCl 20-28ml/min   [name] 05/22/2015,8:59 AM

## 2015-05-22 NOTE — Progress Notes (Signed)
Primary cardiologist: Dr. Satira Sark  Seen for followup: Atrial fibrillation, aortic stenosis  Subjective:    Feels better today, sitting up in bed, less cough and congestion.  Objective:   Temp:  [98.2 F (36.8 C)-98.7 F (37.1 C)] 98.2 F (36.8 C) (05/27 0602) Pulse Rate:  [76-97] 76 (05/27 0602) Resp:  [18] 18 (05/27 0602) BP: (116-148)/(54-78) 123/63 mmHg (05/27 0602) SpO2:  [95 %-97 %] 95 % (05/27 0704) Weight:  [198 lb 11.2 oz (90.13 kg)] 198 lb 11.2 oz (90.13 kg) (05/27 0500) Last BM Date: 05/21/15  Filed Weights   05/20/15 0506 05/21/15 0603 05/22/15 0500  Weight: 196 lb 6.4 oz (89.086 kg) 196 lb 9.6 oz (89.177 kg) 198 lb 11.2 oz (90.13 kg)    Intake/Output Summary (Last 24 hours) at 05/22/15 0912 Last data filed at 05/21/15 2201  Gross per 24 hour  Intake    480 ml  Output    750 ml  Net   -270 ml    Telemetry: Atrial fibrillation, heart rate 100-110 when awake, down to the 60s-80s overnight.  Exam:  General: No distress.  Lungs: Course breath sounds without wheezing.  Cardiac: Irregularly irregular with 5-9/4 systolic murmur.  Extremities: No pitting edema.   Lab Results:  Basic Metabolic Panel:  Recent Labs Lab 05/20/15 0741 05/21/15 0613 05/22/15 0553  NA 138 138 137  K 3.7 3.9 3.9  CL 104 99* 101  CO2 29 28 27   GLUCOSE 105* 86 100*  BUN 20 27* 22*  CREATININE 1.12 1.26* 1.32*  CALCIUM 8.6* 8.6* 8.4*    CBC:  Recent Labs Lab 05/20/15 0533 05/21/15 0613 05/22/15 0553  WBC 13.2* 7.5 7.6  HGB 13.1 12.2* 10.9*  HCT 40.6 37.9* 33.7*  MCV 98.3 98.2 98.0  PLT 136* 119* 105*    Cardiac Enzymes:  Recent Labs Lab 05/19/15 1545  TROPONINI <0.03    Coagulation:  Recent Labs Lab 05/20/15 0533 05/21/15 0613 05/22/15 0553  INR 1.62* 1.82* 2.15*    Echocardiogram 05/21/2015: Study Conclusions  - Left ventricle: The cavity size was normal. Wall thickness was increased in a pattern of mild LVH. Systolic  function was vigorous. The estimated ejection fraction was in the range of 65% to 70%. Wall motion was normal; there were no regional wall motion abnormalities. The study is not technically sufficient to allow evaluation of LV diastolic function. - Aortic valve: There was moderate to severe stenosis. Mean gradient (S): 23 mm Hg. Peak gradient (S): 54 mm Hg. VTI ratio of LVOT to aortic valve: 0.34. Valve area (VTI): 1.07 cm^2. Valve area (Vmax): 1.08 cm^2. Valve area (Vmean): 1.21 cm^2. - Mitral valve: Calcified annulus. Mildly calcified leaflets . Mean gradient (D): 4 mm Hg. Valve area by pressure half-time: 2.24 cm^2. Valve area by continuity equation (using LVOT flow): 1.67 cm^2. - Left atrium: The atrium was moderately dilated. - Right atrium: Central venous pressure (est): 3 mm Hg. - Atrial septum: There was increased thickness of the septum, consistent with lipomatous hypertrophy. No defect or patent foramen ovale was identified. - Tricuspid valve: There was trivial regurgitation. - Pulmonary arteries: Systolic pressure could not be accurately estimated. - Pericardium, extracardiac: There was no pericardial effusion.  Impressions:  - Mild LVH with LVEF 65-70%, indeterminate diastolic function. Moderate left atrial enlargement. Moderate to severe calcific aortic stenosis as outlined above - there has been progression compared to the prior study. Trivial tricuspid regurgitation, unable to assess PASP.   Medications:   Scheduled  Medications: . benzonatate  100 mg Oral TID  . budesonide  0.25 mg Nebulization BID  . digoxin  0.125 mg Oral Daily  . diltiazem  300 mg Oral Daily  . ferrous sulfate  325 mg Oral Daily  . fluticasone  2 spray Each Nare Daily  . folic acid  1 mg Oral Daily  . furosemide  40 mg Oral Daily  . gabapentin  600 mg Oral BID  . ipratropium  0.5 mg Nebulization Q6H  . ketoconazole  1 application Topical Once per day on  Mon Wed Fri  . levalbuterol  0.63 mg Nebulization Q6H  . [START ON 05/23/2015] levofloxacin  750 mg Oral Q48H  . loratadine  10 mg Oral Daily  . omega-3 acid ethyl esters  1 g Oral Daily  . pantoprazole  80 mg Oral Daily  . pneumococcal 23 valent vaccine  0.5 mL Intramuscular Tomorrow-1000  . potassium chloride SA  20 mEq Oral BID  . sodium chloride  3 mL Intravenous Q12H  . tamsulosin  0.4 mg Oral QHS  . Vitamin D (Ergocalciferol)  50,000 Units Oral Weekly  . warfarin  5 mg Oral Once  . Warfarin - Pharmacist Dosing Inpatient   Does not apply Q24H     Infusions: . sodium chloride 10 mL/hr at 05/19/15 2153     PRN Medications:  acetaminophen, albuterol, chlorpheniramine-HYDROcodone, diclofenac sodium, ondansetron **OR** ondansetron (ZOFRAN) IV, oxyCODONE-acetaminophen **AND** oxyCODONE   Assessment:   1. Chronic atrial fibrillation, heart rate elevated during daytime hours when awake. He continues on diltiazem CD, digoxin, and Coumadin.  2. Moderate to severe calcific aortic stenosis as outlined above.  3. Question component of mild diastolic heart failure, has been transitioned back to oral Lasix.  4. COPD exacerbation/bronchitis. Follow-up chest x-ray pending.   Plan/Discussion:    Continue Coumadin, digoxin, increase Cardizem CD to 360 mg daily. Otherwise, no further cardiac workup is planned. Continue outpatient dose of Lasix. Expect that he will be discharged soon. Please arrange cardiology follow-up within the next month.   Satira Sark, M.D., F.A.C.C.

## 2015-05-22 NOTE — Care Management Note (Signed)
Case Management Note  Patient Details  Name: FARLEY CROOKER MRN: [id number] Date of Birth: [birth year]  Subjective/Objective:                    Action/Plan:   Expected Discharge Date:                  Expected Discharge Plan:  Home/Self Care  In-House Referral:  NA  Discharge planning Services  CM Consult  Post Acute Care Choice:  NA Choice offered to:  NA  DME Arranged:    DME Agency:     HH Arranged:    HH Agency:     Status of Service:  Completed, signed off  Medicare Important Message Given:  Yes Date Medicare IM Given:  05/22/15 Medicare IM give by:  Christinia Gully, RN BSN CM Date Additional Medicare IM Given:    Additional Medicare Important Message give by:     If discussed at [place] of Stay Meetings, dates discussed:    Additional Comments: Pt is potential discharge over the weekend. Weekend staff to assess need for home O2 prior to discharge. If pt qualifies pt would like O2 from [facility]. Pt and pts nurse aware of discharge arrangments. Christinia Gully [place], RN 05/22/2015, 11:58 AM

## 2015-05-23 DIAGNOSIS — N179 Acute kidney failure, unspecified: Secondary | ICD-10-CM

## 2015-05-23 LAB — BASIC METABOLIC PANEL
ANION GAP: 8 (ref 5–15)
ANION GAP: 9 (ref 5–15)
BUN: 29 mg/dL — ABNORMAL HIGH (ref 6–20)
BUN: 29 mg/dL — AB (ref 6–20)
CO2: 27 mmol/L (ref 22–32)
CO2: 26 mmol/L (ref 22–32)
Calcium: 8.7 mg/dL — ABNORMAL LOW (ref 8.9–10.3)
Calcium: 8.7 mg/dL — ABNORMAL LOW (ref 8.9–10.3)
Chloride: 99 mmol/L — ABNORMAL LOW (ref 101–111)
Chloride: 96 mmol/L — ABNORMAL LOW (ref 101–111)
Creatinine, Ser: 1.75 mg/dL — ABNORMAL HIGH (ref 0.61–1.24)
Creatinine, Ser: 1.7 mg/dL — ABNORMAL HIGH (ref 0.61–1.24)
GFR calc Af Amer: 42 mL/min — ABNORMAL LOW (ref >60)
GFR, EST AFRICAN AMERICAN: 40 mL/min — AB (ref >60)
GFR, EST NON AFRICAN AMERICAN: 35 mL/min — AB (ref >60)
GFR, EST NON AFRICAN AMERICAN: 36 mL/min — AB (ref >60)
GLUCOSE: 132 mg/dL — AB (ref 65–99)
Glucose, Bld: 152 mg/dL — ABNORMAL HIGH (ref 65–99)
POTASSIUM: 6.1 mmol/L — AB (ref 3.5–5.1)
POTASSIUM: 4.9 mmol/L (ref 3.5–5.1)
Sodium: 134 mmol/L — ABNORMAL LOW (ref 135–145)
Sodium: 131 mmol/L — ABNORMAL LOW (ref 135–145)

## 2015-05-23 LAB — CBC
HCT: 33.8 % — ABNORMAL LOW (ref 39.0–52.0)
HEMOGLOBIN: 11 g/dL — AB (ref 13.0–17.0)
MCH: 32.1 pg (ref 26.0–34.0)
MCHC: 32.5 g/dL (ref 30.0–36.0)
MCV: 98.5 fL (ref 78.0–100.0)
Platelets: 113 10*3/uL — ABNORMAL LOW (ref 150–400)
RBC: 3.43 MIL/uL — AB (ref 4.22–5.81)
RDW: 14.8 % (ref 11.5–15.5)
WBC: 9.8 10*3/uL (ref 4.0–10.5)

## 2015-05-23 LAB — PROTIME-INR
INR: 2.4 — AB (ref 0.00–1.49)
Prothrombin Time: 25.9 seconds — ABNORMAL HIGH (ref 11.6–15.2)

## 2015-05-23 MED ORDER — SODIUM CHLORIDE 0.9 % IV SOLN
INTRAVENOUS | Status: DC
  Administered 2015-05-23 – 2015-05-24 (×2): via INTRAVENOUS
  Administered 2015-05-24 – 2015-05-25 (×2): 70 mL/h via INTRAVENOUS

## 2015-05-23 MED ORDER — PREDNISONE 10 MG PO TABS
10.0000 mg | ORAL_TABLET | Freq: Two times a day (BID) | ORAL | Status: DC
  Administered 2015-05-23 – 2015-05-25 (×4): 10 mg via ORAL
  Filled 2015-05-23 (×4): qty 1

## 2015-05-23 MED ORDER — LEVALBUTEROL HCL 0.63 MG/3ML IN NEBU
0.6300 mg | INHALATION_SOLUTION | Freq: Three times a day (TID) | RESPIRATORY_TRACT | Status: DC
  Administered 2015-05-23 – 2015-05-25 (×6): 0.63 mg via RESPIRATORY_TRACT
  Filled 2015-05-23 (×7): qty 3

## 2015-05-23 MED ORDER — IPRATROPIUM BROMIDE 0.02 % IN SOLN
0.5000 mg | Freq: Three times a day (TID) | RESPIRATORY_TRACT | Status: DC
  Administered 2015-05-23 – 2015-05-25 (×6): 0.5 mg via RESPIRATORY_TRACT
  Filled 2015-05-23 (×7): qty 2.5

## 2015-05-23 MED ORDER — WARFARIN SODIUM 2 MG PO TABS
2.0000 mg | ORAL_TABLET | Freq: Once | ORAL | Status: AC
  Administered 2015-05-23: 2 mg via ORAL
  Filled 2015-05-23: qty 1

## 2015-05-23 NOTE — Progress Notes (Signed)
[facility] for Warfarin Indication: atrial fibrillation  Allergies  Allergen Reactions  . Cymbalta [Duloxetine Hcl] Other (See Comments)    Confusion   . Procaine Hcl Hives and Other (See Comments)    NOVOCAINE: Sweating, Confusion, Not in right state of mind.  [name]    unknown   Patient Measurements: Height: 5\' 8"  (172.7 cm) Weight: 198 lb 11.2 oz (90.13 kg) IBW/kg (Calculated) : 68.4  Vital Signs: Temp: 97.5 F (36.4 C) (05/28 0631) Temp Source: Oral (05/28 0631) BP: 123/59 mmHg (05/28 0631) Pulse Rate: 83 (05/28 0631)  Labs:  Recent Labs  05/21/15 8182 05/22/15 0553 05/23/15 0616  HGB 12.2* 10.9* 11.0*  HCT 37.9* 33.7* 33.8*  PLT 119* 105* 113*  LABPROT 21.0* 23.8* 25.9*  INR 1.82* 2.15* 2.40*  CREATININE 1.26* 1.32* 1.75*   Estimated Creatinine Clearance: 36.1 mL/min (by C-G formula based on Cr of 1.75).  Medical History: Past Medical History  Diagnosis Date  . COPD (chronic obstructive pulmonary disease)     Uses occasional nighttime O2  . Disseminated herpes zoster 2010  . GERD (gastroesophageal reflux disease)   . Asthma   . Pulmonary embolus     2003 and 2011  . Pulmonary nodules     Chest CT 09/11  . Mixed hyperlipidemia   . Chronic atrial fibrillation   . Lupus anticoagulant positive   . Essential hypertension, benign   . Cholelithiasis   . Rheumatoid arthritis(714.0)   . History of chicken pox 1941; 2011  . Anemia   . Chronic lower back pain   . Chronic diastolic heart failure   . Cirrhosis     Questionable, AFP normal Feb 01/2012, hx of ETOH use   . Headache(784.0)   . Cervical spondylosis without myelopathy   . Stage III chronic kidney disease   . Cellulitis of leg, left 01/03/2015  . Aortic stenosis, moderate 05/21/2015   Medications:  Prescriptions prior to admission  Medication Sig Dispense Refill Last Dose  . albuterol (PROAIR HFA) 108 (90 BASE) MCG/ACT  inhaler Inhale 2 puffs into the lungs every 6 (six) hours as needed. Shortness of Breath   05/18/2015 at Unknown time  . albuterol (PROVENTIL) (2.5 MG/3ML) 0.083% nebulizer solution Take 2.5 mg by nebulization every 6 (six) hours as needed. Shortness of Breath   Past Month at Unknown time  . cetirizine (ZYRTEC) 10 MG tablet Take 10 mg by mouth daily.     05/18/2015 at Unknown time  . digoxin (LANOXIN) 0.125 MG tablet Take 0.125 mg by mouth daily.   05/19/2015 at Unknown time  . diltiazem (CARDIZEM CD) 300 MG 24 hr capsule Take 1 capsule (300 mg total) by mouth daily. (Patient taking differently: Take 300 mg by mouth every evening. ) 30 capsule 11 05/18/2015 at Unknown time  . Etanercept (ENBREL) 25 MG/0.5ML SOSY Inject into the skin 2 (two) times a week.    05/15/2015  . ferrous sulfate 325 (65 FE) MG tablet Take 325 mg by mouth daily.   05/19/2015 at Unknown time  . folic acid (FOLVITE) 1 MG tablet Take 1 mg by mouth daily.    05/19/2015 at Unknown time  . gabapentin (NEURONTIN) 300 MG capsule TAKE ONE CAPSULE BY MOUTH EVERY MORNING AND TWO CAPSULES IN THE EVENING (Patient taking differently: TAKE TWO CAPSULES BY MOUTH TWICE DAILY) 30 capsule 0 05/19/2015 at Unknown time  . HYDROcodone-acetaminophen (NORCO) 10-325 MG per tablet Take 1 tablet by mouth  2 (two) times daily. FOR HEADACHE PAIN   05/19/2015 at Unknown time  . ketoconazole (NIZORAL) 2 % shampoo Apply 1 application topically 3 (three) times a week.    Past Week at Unknown time  . methotrexate (RHEUMATREX) 2.5 MG tablet Take 7.5 mg by mouth 2 (two) times a week. *TAKE 3 TABLETS BY MOUTH TWICE WEEKLY ON FRIDAYS AND SATURDAYS*   05/16/2015  . mometasone (NASONEX) 50 MCG/ACT nasal spray Place 2 sprays into the nose daily.   05/19/2015 at Unknown time  . omeprazole (PRILOSEC) 40 MG capsule Take 40 mg by mouth daily.   05/19/2015 at Unknown time  . oxyCODONE-acetaminophen (PERCOCET) 10-325 MG per tablet Take 1 tablet by mouth every 6 (six) hours as needed for  pain.   05/19/2015 at Unknown time  . potassium chloride SA (K-DUR,KLOR-CON) 20 MEQ tablet Take 20 mEq by mouth 2 (two) times daily.    05/19/2015 at Unknown time  . tadalafil (CIALIS) 5 MG tablet Take 5 mg by mouth daily.   UNKNOWN  . Vitamin D, Ergocalciferol, (DRISDOL) 50000 UNITS CAPS capsule Take 1 capsule by mouth once a week.  4 Past Week at Unknown time  . warfarin (COUMADIN) 2 MG tablet Take 5 mg by mouth See admin instructions. No medication on Wednesday. 5 on al other days   05/18/2015 at 2000  . docusate sodium 100 MG CAPS Take 100 mg by mouth daily as needed for mild constipation. (Patient not taking: Reported on 05/19/2015) 10 capsule 0   . furosemide (LASIX) 40 MG tablet Take 1 tablet (40 mg total) by mouth daily. *Sometimes takes two tablets in the morning and one tablet in the evening as regimen depending on fluid retention (Patient not taking: Reported on 05/19/2015) 30 tablet 0   . Omega-3 Fatty Acids (FISH OIL) 1000 MG CAPS Take 2 capsules by mouth 2 (two) times daily.    Not Taking at Unknown time  . spironolactone (ALDACTONE) 25 MG tablet Take 0.5 tablets (12.5 mg total) by mouth daily. (Patient not taking: Reported on 05/19/2015) 30 tablet 0    Assessment: 81 yo M on chronic Coumadin for afib.  Home dose listed above.  INR below goal on admission, but is now in goal range.    He was started on Levaquin this admission which can elevate INR response.  Scr continues to trend up- estimated CrCl <62ml/min.  No bleeding noted.   Goal of Therapy:  INR 2-3   Plan:  Warfarin 2mg  PO x 1  Daily PT/INR   [name] 05/23/2015,8:28 AM

## 2015-05-23 NOTE — Progress Notes (Signed)
CRITICAL VALUE ALERT  Critical value received:  Potassium  Date of notification:  05/23/2015  Time of notification:  4591  Critical value read back:Yes.    Nurse who received alert:  Peterson Lombard  MD notified (1st page):  Fisher  Time of first page:  [phone]

## 2015-05-23 NOTE — Progress Notes (Signed)
[facility] PROGRESS NOTE  [name] JGG:[id number] DOB: [birth year] DOA: 05/19/2015 PCP: [name], MD    Code Status: Full code: Full code Family Communication: Discussed with son Disposition Plan: Discharge when clinically appropriate   Consultants:  Cardiology  Procedures:  2-D echocardiogram:Study Conclusions - Left ventricle: The cavity size was normal. Wall thickness was increased in a pattern of mild LVH. Systolic function was vigorous. The estimated ejection fraction was in the range of 65% to 70%. Wall motion was normal; there were no regional wall motion abnormalities. The study is not technically sufficient to allow evaluation of LV diastolic function. - Aortic valve: There was moderate to severe stenosis. Mean gradient (S): 23 mm Hg. Peak gradient (S): 54 mm Hg. VTI ratio of LVOT to aortic valve: 0.34. Valve area (VTI): 1.07 cm^2. Valve area (Vmax): 1.08 cm^2. Valve area (Vmean): 1.21 cm^2. - Mitral valve: Calcified annulus. Mildly calcified leaflets . Mean gradient (D): 4 mm Hg. Valve area by pressure half-time: 2.24 cm^2. Valve area by continuity equation (using LVOT flow): 1.67 cm^2. - Left atrium: The atrium was moderately dilated. - Right atrium: Central venous pressure (est): 3 mm Hg. - Atrial septum: There was increased thickness of the septum, consistent with lipomatous hypertrophy. No defect or patent foramen ovale was identified. - Tricuspid valve: There was trivial regurgitation. - Pulmonary arteries: Systolic pressure could not be accurately estimated. - Pericardium, extracardiac: There was no pericardial effusion. Impressions: - Mild LVH with LVEF 65-70%, indeterminate diastolic function. Moderate left atrial enlargement. Moderate to severe calcific aortic stenosis as outlined above - there has been progression compared to the prior study. Trivial tricuspid regurgitation, unable to assess  PASP.  Antibiotics:  Levaquin 5/24>>  HPI/Subjective: Patient says he is breathing better. He complains of thirst. His cough is better. He denies chest pain or palpitations.  Objective: Filed Vitals:   05/23/15 0631  BP: 123/59  Pulse: 83  Temp: 97.5 F (36.4 C)  Resp: 21   oxygen saturation 89% on room air.  Intake/Output Summary (Last 24 hours) at 05/23/15 1136 Last data filed at 05/23/15 0855  Gross per 24 hour  Intake   1083 ml  Output    750 ml  Net    333 ml   Filed Weights   05/20/15 0506 05/21/15 0603 05/22/15 0500  Weight: 89.086 kg (196 lb 6.4 oz) 89.177 kg (196 lb 9.6 oz) 90.13 kg (198 lb 11.2 oz)    Exam:   General:  [name] in no acute distress.  Cardiovascular: Irregular, irregular, with a 2 to 3/6 systolic murmur.  Respiratory: Occasional crackle/wheezes, significantly decreased from several days ago. Breathing nonlabored.  Abdomen: Positive bowel sounds, soft, nontender, non-distended.  Musculoskeletal/extremities: No pedal edema. No acute hot red joints.  Neurologic: He is alert and oriented 2. Cranial nerves II through XII are grossly intact.   Data Reviewed: Basic Metabolic Panel:  Recent Labs Lab 05/20/15 0741 05/21/15 0613 05/22/15 0553 05/23/15 0616 05/23/15 0827  NA 138 138 137 134* 131*  K 3.7 3.9 3.9 6.1* 4.9  CL 104 99* 101 99* 96*  CO2 29 28 27 27 26   GLUCOSE 105* 86 100* 132* 152*  BUN 20 27* 22* 29* 29*  CREATININE 1.12 1.26* 1.32* 1.75* 1.70*  CALCIUM 8.6* 8.6* 8.4* 8.7* 8.7*   Liver Function Tests:  Recent Labs Lab 05/19/15 1545 05/20/15 0533  AST 23 21  ALT 14* 13*  ALKPHOS 48 48  BILITOT 3.3* 3.8*  PROT 8.2* 8.2*  ALBUMIN 3.6 3.4*   No results for input(s): LIPASE, AMYLASE in the last 168 hours. No results for input(s): AMMONIA in the last 168 hours. CBC:  Recent Labs Lab 05/19/15 1545 05/20/15 0533 05/21/15 3662 05/22/15 0553 05/23/15 0616  WBC 11.2* 13.2* 7.5 7.6 9.8  HGB  12.7* 13.1 12.2* 10.9* 11.0*  HCT 38.7* 40.6 37.9* 33.7* 33.8*  MCV 98.5 98.3 98.2 98.0 98.5  PLT 104* 136* 119* 105* 113*   Cardiac Enzymes:  Recent Labs Lab 05/19/15 1545  TROPONINI <0.03   BNP (last 3 results)  Recent Labs  05/19/15 1545 05/20/15 0533  BNP 413.0* 382.0*    ProBNP (last 3 results) No results for input(s): PROBNP in the last 8760 hours.  CBG: No results for input(s): GLUCAP in the last 168 hours.  Recent Results (from the past 240 hour(s))  Culture, blood (routine x 2)     Status: None (Preliminary result)   Collection Time: 05/19/15  9:00 PM  Result Value Ref Range Status   Specimen Description BLOOD RIGHT ARM  Final   Special Requests BOTTLES DRAWN AEROBIC AND ANAEROBIC 6CC  Final   Culture NO GROWTH 2 DAYS  Final   Report Status PENDING  Incomplete  Culture, blood (routine x 2)     Status: None (Preliminary result)   Collection Time: 05/19/15  9:05 PM  Result Value Ref Range Status   Specimen Description BLOOD RIGHT HAND  Final   Special Requests BOTTLES DRAWN AEROBIC AND ANAEROBIC 6CC  Final   Culture NO GROWTH 2 DAYS  Final   Report Status PENDING  Incomplete  MRSA PCR Screening     Status: None   Collection Time: 05/20/15 12:02 AM  Result Value Ref Range Status   MRSA by PCR NEGATIVE NEGATIVE Final    Comment:        The GeneXpert MRSA Assay (FDA approved for NASAL specimens only), is one component of a comprehensive MRSA colonization surveillance program. It is not intended to diagnose MRSA infection nor to guide or monitor treatment for MRSA infections.      Studies: Dg Chest 2 View  05/22/2015   CLINICAL DATA:  Congestive heart failure  EXAM: CHEST  2 VIEW  COMPARISON:  PA and lateral chest of May 19, 2015  FINDINGS: The lungs remain hyperinflated. There remains a small right pleural effusion. The interstitial markings remain mildly increased but have improved slightly since the earlier study. The cardiac silhouette is top-normal  in size. The pulmonary vascularity is less engorged. The mediastinum is normal in width. The bony thorax exhibits no acute abnormality. There is chronic compression of T4.  IMPRESSION: Improving CHF superimposed upon COPD. There is a stable small right pleural effusion.   Electronically Signed   By: David  Martinique M.D.   On: 05/22/2015 09:25    Scheduled Meds: . benzonatate  100 mg Oral TID  . budesonide  0.25 mg Nebulization BID  . digoxin  0.125 mg Oral Daily  . diltiazem  360 mg Oral Daily  . ferrous sulfate  325 mg Oral Daily  . fluticasone  2 spray Each Nare Daily  . folic acid  1 mg Oral Daily  . gabapentin  600 mg Oral BID  . ipratropium  0.5 mg Nebulization TID  . ketoconazole  1 application Topical Once per day on Mon Wed Fri  . levalbuterol  0.63 mg Nebulization TID  . levofloxacin  750 mg Oral Q48H  . loratadine  10 mg Oral Daily  .  omega-3 acid ethyl esters  1 g Oral Daily  . pantoprazole  80 mg Oral Daily  . predniSONE  10 mg Oral BID WC  . sodium chloride  3 mL Intravenous Q12H  . tamsulosin  0.4 mg Oral QHS  . Vitamin D (Ergocalciferol)  50,000 Units Oral Weekly  . warfarin  2 mg Oral Once  . Warfarin - Pharmacist Dosing Inpatient   Does not apply Q24H   Continuous Infusions: . sodium chloride     Assessment and plan:  Principal Problem:   COPD exacerbation Active Problems:   Acute on chronic diastolic CHF (congestive heart failure)   Chronic atrial fibrillation   Aortic stenosis, moderate   Essential hypertension, benign   Thrombocytopenia   Acute diastolic CHF (congestive heart failure)   1. COPD exacerbation. The patient has a history of COPD, non-oxygen dependent. He stopped smoking years ago. He was started on IV Levaquin. Xopenex and Atrovent nebulizers were added subsequently at every 6 hours. When necessary Tussionex and scheduled 3 times a day Tessalon Perles were ordered for his cough. Pulmicort nebulizer was added. He did not previously have a  wheezing component, but he does have some bronchospasms, so a small prednisone taper was started on 5/27.  He is now afebrile. Blood cultures are negative to date. He has significantly fewer wheezes and rhonchus crackles. His oxygen saturations on room air ranging from 89-95%. We'll continue to monitor.  Fever. This is presumed to be secondary to COPD with exacerbation. The patient has some immunocompromise on Enbrel and methotrexate for treatment of rheumatoid arthritis. We'll continue to hold these. Blood cultures are negative to date. His influenza panel was negative. His fever has resolved.  Acute on chronic diastolic heart failure. On admission, the patient's chest x-ray was indicative of some pulmonary edema. His proBNP was only 413. His troponin I was within normal limits.  The patient was started on IV Lasix. Cardiology was consulted and noted that the patient has a history of diastolic dysfunction with an EF of 70-75% and mild aortic stenosis per previous echo in 2014. Results of the 2-D echocardiogram during this admission revealed indeterminate diastolic function, moderate to severe aortic valve stenosis which has increased, and ejection fraction of 65-70%. Patient's TSH was within normal limits at 1.0. The patient's in/out is about even. His follow-up chest x-ray revealed a decrease in pulmonary edema.  Cardiology has changed the Lasix to 40 mg orally daily, his home dose. -Given his acute renal insufficiency, will hold Lasix and start gentle IV fluids.  Acute kidney injury and hyponatremia. The patient's serum sodium was 139 and his serum creatinine was 1.09 on admission. Since the start of IV Lasix, his creatinine has increased and his serum sodium has decreased. This may be the consequence of volume contraction. -Therefore, will hold Lasix and start gentle normal saline.  Hyperkalemia. Patient serum potassium was noted to be 6.1 this morning. Repeat serum sodium was 4.9.  Therefore, the former was probably a lab error. -Nevertheless, potassium chloride supplementation was discontinued.  Chronic atrial fibrillation; with RVR. His heart rate had increased, in part, due to his recent fever. His heart rate and fever curve has improved. We'll continue diltiazem and digoxin. Cardiology increased her diltiazem to 360 mg daily.  Continue Coumadin, dosing per pharmacy.  Thrombocytopenia. The patient has a history of thrombocytopenia with a baseline ranging from 74-125. Etiology unknown but could be secondary to Enbrel and methotrexate. His TSH was within normal limits. His vitamin B12 level  was within normal limits at 507. We'll continue to monitor.   Time spent: 25 minutes    [facility] Pager [phone]. If 7PM-7AM, please contact night-coverage at [email or web address], password [facility] 05/23/2015, 11:36 AM  LOS: 4 days

## 2015-05-24 ENCOUNTER — Inpatient Hospital Stay (HOSPITAL_COMMUNITY): Payer: Medicare Other

## 2015-05-24 LAB — BASIC METABOLIC PANEL
ANION GAP: 7 (ref 5–15)
BUN: 31 mg/dL — AB (ref 6–20)
CO2: 27 mmol/L (ref 22–32)
CREATININE: 1.65 mg/dL — AB (ref 0.61–1.24)
Calcium: 8.7 mg/dL — ABNORMAL LOW (ref 8.9–10.3)
Chloride: 102 mmol/L (ref 101–111)
GFR calc Af Amer: 43 mL/min — ABNORMAL LOW (ref >60)
GFR calc non Af Amer: 37 mL/min — ABNORMAL LOW (ref >60)
Glucose, Bld: 117 mg/dL — ABNORMAL HIGH (ref 65–99)
POTASSIUM: 4.7 mmol/L (ref 3.5–5.1)
Sodium: 136 mmol/L (ref 135–145)

## 2015-05-24 LAB — CULTURE, BLOOD (ROUTINE X 2)
Culture: NO GROWTH
Culture: NO GROWTH

## 2015-05-24 LAB — CBC
HCT: 33.1 % — ABNORMAL LOW (ref 39.0–52.0)
Hemoglobin: 10.7 g/dL — ABNORMAL LOW (ref 13.0–17.0)
MCH: 31.7 pg (ref 26.0–34.0)
MCHC: 32.3 g/dL (ref 30.0–36.0)
MCV: 97.9 fL (ref 78.0–100.0)
PLATELETS: 105 10*3/uL — AB (ref 150–400)
RBC: 3.38 MIL/uL — ABNORMAL LOW (ref 4.22–5.81)
RDW: 14.9 % (ref 11.5–15.5)
WBC: 7.2 10*3/uL (ref 4.0–10.5)

## 2015-05-24 LAB — PROTIME-INR
INR: 2.88 — ABNORMAL HIGH (ref 0.00–1.49)
PROTHROMBIN TIME: 29.7 s — AB (ref 11.6–15.2)

## 2015-05-24 MED ORDER — WARFARIN SODIUM 2 MG PO TABS
2.0000 mg | ORAL_TABLET | Freq: Once | ORAL | Status: AC
  Administered 2015-05-24: 2 mg via ORAL
  Filled 2015-05-24: qty 1

## 2015-05-24 NOTE — Progress Notes (Signed)
Pt ambulated in hallway with Dennise NT, approximately 100 ft  Patient Saturations on Room Air at Rest = 95%  Patient Saturations on Room Air while Ambulating = 85%  Patient Saturations on 2 Liters of oxygen while Ambulating = 94%

## 2015-05-24 NOTE — Progress Notes (Signed)
[facility] PROGRESS NOTE  RESHAUN BRISENO ERX:[id number] DOB: 04/01/1931 DOA: 05/19/2015 PCP: [name], MD    Code Status: Full code: Full code Family Communication: Discussed with son on 5/28. Disposition Plan: Discharge when clinically appropriate   Consultants:  Cardiology  Procedures:  2-D echocardiogram:Study Conclusions - Left ventricle: The cavity size was normal. Wall thickness was increased in a pattern of mild LVH. Systolic function was vigorous. The estimated ejection fraction was in the range of 65% to 70%. Wall motion was normal; there were no regional wall motion abnormalities. The study is not technically sufficient to allow evaluation of LV diastolic function. - Aortic valve: There was moderate to severe stenosis. Mean gradient (S): 23 mm Hg. Peak gradient (S): 54 mm Hg. VTI ratio of LVOT to aortic valve: 0.34. Valve area (VTI): 1.07 cm^2. Valve area (Vmax): 1.08 cm^2. Valve area (Vmean): 1.21 cm^2. - Mitral valve: Calcified annulus. Mildly calcified leaflets . Mean gradient (D): 4 mm Hg. Valve area by pressure half-time: 2.24 cm^2. Valve area by continuity equation (using LVOT flow): 1.67 cm^2. - Left atrium: The atrium was moderately dilated. - Right atrium: Central venous pressure (est): 3 mm Hg. - Atrial septum: There was increased thickness of the septum, consistent with lipomatous hypertrophy. No defect or patent foramen ovale was identified. - Tricuspid valve: There was trivial regurgitation. - Pulmonary arteries: Systolic pressure could not be accurately estimated. - Pericardium, extracardiac: There was no pericardial effusion. Impressions: - Mild LVH with LVEF 65-70%, indeterminate diastolic function. Moderate left atrial enlargement. Moderate to severe calcific aortic stenosis as outlined above - there has been progression compared to the prior study. Trivial tricuspid regurgitation, unable  to assess PASP.  Antibiotics:  Levaquin 5/24>>  HPI/Subjective: Patient still has a cough, but much better. He denies chest pain or palpitations. He is less thirsty with IV fluids.  Objective: Filed Vitals:   05/24/15 0633  BP: 120/66  Pulse: 91  Temp: 97.7 F (36.5 C)  Resp: 21   oxygen saturation 95% on room air; 85% on room air with ambulation; 94% on 2 L of oxygen following ambulation.   Intake/Output Summary (Last 24 hours) at 05/24/15 1437 Last data filed at 05/24/15 1209  Gross per 24 hour  Intake 2547.5 ml  Output   1920 ml  Net  627.5 ml   Filed Weights   05/21/15 0603 05/22/15 0500 05/24/15 0500  Weight: 89.177 kg (196 lb 9.6 oz) 90.13 kg (198 lb 11.2 oz) 93.668 kg (206 lb 8 oz)    Exam:   General:  [name] in no acute distress.  Cardiovascular: Irregular, irregular, with a 2 to 3/6 systolic murmur.  Respiratory: Occasional crackle/wheezes, breathing nonlabored.  Abdomen: Positive bowel sounds, soft, nontender, non-distended.  Musculoskeletal/extremities: No pedal edema. No acute hot red joints.  Neurologic: He is alert and oriented 2. Cranial nerves II through XII are grossly intact.   Data Reviewed: Basic Metabolic Panel:  Recent Labs Lab 05/21/15 0613 05/22/15 0553 05/23/15 0616 05/23/15 0827 05/24/15 0556  NA 138 137 134* 131* 136  K 3.9 3.9 6.1* 4.9 4.7  CL 99* 101 99* 96* 102  CO2 28 27 27 26 27   GLUCOSE 86 100* 132* 152* 117*  BUN 27* 22* 29* 29* 31*  CREATININE 1.26* 1.32* 1.75* 1.70* 1.65*  CALCIUM 8.6* 8.4* 8.7* 8.7* 8.7*   Liver Function Tests:  Recent Labs Lab 05/19/15 1545 05/20/15 0533  AST 23 21  ALT 14* 13*  ALKPHOS 48 48  BILITOT 3.3* 3.8*  PROT 8.2* 8.2*  ALBUMIN 3.6 3.4*   No results for input(s): LIPASE, AMYLASE in the last 168 hours. No results for input(s): AMMONIA in the last 168 hours. CBC:  Recent Labs Lab 05/20/15 0533 05/21/15 1740 05/22/15 0553 05/23/15 0616 05/24/15 0556   WBC 13.2* 7.5 7.6 9.8 7.2  HGB 13.1 12.2* 10.9* 11.0* 10.7*  HCT 40.6 37.9* 33.7* 33.8* 33.1*  MCV 98.3 98.2 98.0 98.5 97.9  PLT 136* 119* 105* 113* 105*   Cardiac Enzymes:  Recent Labs Lab 05/19/15 1545  TROPONINI <0.03   BNP (last 3 results)  Recent Labs  05/19/15 1545 05/20/15 0533  BNP 413.0* 382.0*    ProBNP (last 3 results) No results for input(s): PROBNP in the last 8760 hours.  CBG: No results for input(s): GLUCAP in the last 168 hours.  Recent Results (from the past 240 hour(s))  Culture, blood (routine x 2)     Status: None   Collection Time: 05/19/15  9:00 PM  Result Value Ref Range Status   Specimen Description BLOOD RIGHT ARM  Final   Special Requests BOTTLES DRAWN AEROBIC AND ANAEROBIC 6CC  Final   Culture NO GROWTH 5 DAYS  Final   Report Status 05/24/2015 FINAL  Final  Culture, blood (routine x 2)     Status: None   Collection Time: 05/19/15  9:05 PM  Result Value Ref Range Status   Specimen Description BLOOD RIGHT HAND  Final   Special Requests BOTTLES DRAWN AEROBIC AND ANAEROBIC 6CC  Final   Culture NO GROWTH 5 DAYS  Final   Report Status 05/24/2015 FINAL  Final  MRSA PCR Screening     Status: None   Collection Time: 05/20/15 12:02 AM  Result Value Ref Range Status   MRSA by PCR NEGATIVE NEGATIVE Final    Comment:        The GeneXpert MRSA Assay (FDA approved for NASAL specimens only), is one component of a comprehensive MRSA colonization surveillance program. It is not intended to diagnose MRSA infection nor to guide or monitor treatment for MRSA infections.      Studies: US Renal  05/24/2015   CLINICAL DATA:  Acute kidney injury.  EXAM: RENAL / URINARY TRACT ULTRASOUND COMPLETE  COMPARISON:  03/17/2015  FINDINGS: Right Kidney:  Length: 11.5 cm. Mild cortical thinning. Normal parenchymal echogenicity. 16 mm mid to upper pole cyst. 16 mm upper pole, exophytic cyst. No other masses. No stones. No hydronephrosis.  Left Kidney:  Length:  12.5 cm. Mid to upper pole scarring. 2.7 cm upper pole exophytic cyst. No other renal masses, no stones and no hydronephrosis.  Bladder:  Appears normal for degree of bladder distention.  Right pleural effusion.  IMPRESSION: 1. No hydronephrosis. 2. Upper pole renal cortical thinning on the right. The 2 upper pole renal scarring on the left. Bilateral renal cysts. 3. Midpole left renal cyst seen on the prior study not resolved currently. Other cysts are relatively stable.   Electronically Signed   By: Lajean Manes M.D.   On: 05/24/2015 11:48    Scheduled Meds: . benzonatate  100 mg Oral TID  . budesonide  0.25 mg Nebulization BID  . digoxin  0.125 mg Oral Daily  . diltiazem  360 mg Oral Daily  . ferrous sulfate  325 mg Oral Daily  . fluticasone  2 spray Each Nare Daily  . folic acid  1 mg Oral Daily  . gabapentin  600 mg Oral BID  . ipratropium  0.5 mg Nebulization TID  . ketoconazole  1 application Topical Once per day on Mon Wed Fri  . levalbuterol  0.63 mg Nebulization TID  . levofloxacin  750 mg Oral Q48H  . loratadine  10 mg Oral Daily  . omega-3 acid ethyl esters  1 g Oral Daily  . pantoprazole  80 mg Oral Daily  . predniSONE  10 mg Oral BID WC  . sodium chloride  3 mL Intravenous Q12H  . tamsulosin  0.4 mg Oral QHS  . Vitamin D (Ergocalciferol)  50,000 Units Oral Weekly  . warfarin  2 mg Oral Once  . Warfarin - Pharmacist Dosing Inpatient   Does not apply Q24H   Continuous Infusions: . sodium chloride 70 mL/hr (05/24/15 0118)   Assessment and plan:  Principal Problem:   COPD exacerbation Active Problems:   Acute on chronic diastolic CHF (congestive heart failure)   Chronic atrial fibrillation   Aortic stenosis, moderate   Essential hypertension, benign   Thrombocytopenia   Acute diastolic CHF (congestive heart failure)   AKI (acute kidney injury)   1. COPD exacerbation. The patient has a history of COPD, non-oxygen dependent. He stopped smoking years ago. He was  started on IV Levaquin. Xopenex and Atrovent nebulizers were added subsequently at every 6 hours. When necessary Tussionex and scheduled 3 times a day Tessalon Perles were ordered for his cough. Pulmicort nebulizer was added. He did not previously have a wheezing component, but he does have some bronchospasms, so a small prednisone taper was started on 5/27.  He is now afebrile. Blood cultures are negative to date. He has significantly fewer wheezes and rhonchus crackles. His oxygen saturations With ambulation decreased to 85%, but is 95% at rest.  We'll continue to monitor.  Fever. This is presumed to be secondary to COPD with exacerbation. The patient has some immunocompromise on Enbrel and methotrexate for treatment of rheumatoid arthritis. We'll continue to hold these. Blood cultures are negative to date. His influenza panel was negative. His fever has resolved.  Acute on chronic diastolic heart failure. On admission, the patient's chest x-ray was indicative of some pulmonary edema. His proBNP was only 413. His troponin I was within normal limits.  The patient was started on IV Lasix. Cardiology was consulted and noted that the patient has a history of diastolic dysfunction with an EF of 70-75% and mild aortic stenosis per previous echo in 2014. Results of the 2-D echocardiogram during this admission revealed indeterminate diastolic function, moderate to severe aortic valve stenosis which has increased, and ejection fraction of 65-70%. Patient's TSH was within normal limits at 1.0. The patient's in/out is about even. His follow-up chest x-ray revealed a decrease in pulmonary edema.  Cardiology had changed the Lasix to 40 mg orally daily, his home dose. -Given his acute renal insufficiency, Lasix was started to be withheld on 5/28.   Acute kidney injury and hyponatremia. The patient's serum sodium was 139 and his serum creatinine was 1.09 on admission. Since the start of IV Lasix, his  creatinine has increased and his serum sodium has decreased. This may be the consequence of volume contraction or hypoperfusion given low-normal blood pressures. Lasix was withheld and he was started on gentle IV fluids on 5/28. He was started on Flomax for urinary frequency and hesitancy. His renal ultrasound revealed no evidence of hydronephrosis.  Hyperkalemia. Patient serum potassium was noted to be 6.1 this morning. Repeat serum sodium was 4.9. Therefore, the former was probably a lab error. -  Nevertheless, potassium chloride supplementation was discontinued.  Chronic atrial fibrillation; with RVR. His heart rate had increased, in part, due to his recent fever. His heart rate and fever curve has improved. Cardiology increased his diltiazem to 360 mg daily. Digoxin was continued.   Continue Coumadin, dosing per pharmacy. -Because of a reported sinus pause will hold digoxin and check a digoxin level tomorrow morning.  Thrombocytopenia. The patient has a history of thrombocytopenia with a baseline ranging from 74-125. Etiology unknown but could be secondary to Enbrel and methotrexate. His TSH was within normal limits. His vitamin B12 level was within normal limits at 507. We'll continue to monitor.   Time spent: 30 minutes    [facility] Pager [phone]. If 7PM-7AM, please contact night-coverage at [email or web address], password [facility] 05/24/2015, 2:37 PM  LOS: 5 days

## 2015-05-24 NOTE — Progress Notes (Signed)
[facility] for Warfarin Indication: atrial fibrillation  Allergies  Allergen Reactions  . Cymbalta [Duloxetine Hcl] Other (See Comments)    Confusion   . Procaine Hcl Hives and Other (See Comments)    NOVOCAINE: Sweating, Confusion, Not in right state of mind.  [name]    unknown   Patient Measurements: Height: 5\' 8"  (172.7 cm) Weight: 206 lb 8 oz (93.668 kg) IBW/kg (Calculated) : 68.4  Vital Signs: Temp: 97.7 F (36.5 C) (05/29 0633) Temp Source: Oral (05/29 9767) BP: 120/66 mmHg (05/29 3419) Pulse Rate: 91 (05/29 0633)  Labs:  Recent Labs  05/22/15 0553 05/23/15 0616 05/23/15 0827 05/24/15 0556  HGB 10.9* 11.0*  --  10.7*  HCT 33.7* 33.8*  --  33.1*  PLT 105* 113*  --  105*  LABPROT 23.8* 25.9*  --  29.7*  INR 2.15* 2.40*  --  2.88*  CREATININE 1.32* 1.75* 1.70* 1.65*   Estimated Creatinine Clearance: 39 mL/min (by C-G formula based on Cr of 1.65).  Medical History: Past Medical History  Diagnosis Date  . COPD (chronic obstructive pulmonary disease)     Uses occasional nighttime O2  . Disseminated herpes zoster 2010  . GERD (gastroesophageal reflux disease)   . Asthma   . Pulmonary embolus     2003 and 2011  . Pulmonary nodules     Chest CT 09/11  . Mixed hyperlipidemia   . Chronic atrial fibrillation   . Lupus anticoagulant positive   . Essential hypertension, benign   . Cholelithiasis   . Rheumatoid arthritis(714.0)   . History of chicken pox 1941; 2011  . Anemia   . Chronic lower back pain   . Chronic diastolic heart failure   . Cirrhosis     Questionable, AFP normal Feb 01/2012, hx of ETOH use   . Headache(784.0)   . Cervical spondylosis without myelopathy   . Stage III chronic kidney disease   . Cellulitis of leg, left 01/03/2015  . Aortic stenosis, moderate 05/21/2015   Medications:  Prescriptions prior to admission  Medication Sig Dispense Refill Last Dose  .  albuterol (PROAIR HFA) 108 (90 BASE) MCG/ACT inhaler Inhale 2 puffs into the lungs every 6 (six) hours as needed. Shortness of Breath   05/18/2015 at Unknown time  . albuterol (PROVENTIL) (2.5 MG/3ML) 0.083% nebulizer solution Take 2.5 mg by nebulization every 6 (six) hours as needed. Shortness of Breath   Past Month at Unknown time  . cetirizine (ZYRTEC) 10 MG tablet Take 10 mg by mouth daily.     05/18/2015 at Unknown time  . digoxin (LANOXIN) 0.125 MG tablet Take 0.125 mg by mouth daily.   05/19/2015 at Unknown time  . diltiazem (CARDIZEM CD) 300 MG 24 hr capsule Take 1 capsule (300 mg total) by mouth daily. (Patient taking differently: Take 300 mg by mouth every evening. ) 30 capsule 11 05/18/2015 at Unknown time  . Etanercept (ENBREL) 25 MG/0.5ML SOSY Inject into the skin 2 (two) times a week.    05/15/2015  . ferrous sulfate 325 (65 FE) MG tablet Take 325 mg by mouth daily.   05/19/2015 at Unknown time  . folic acid (FOLVITE) 1 MG tablet Take 1 mg by mouth daily.    05/19/2015 at Unknown time  . gabapentin (NEURONTIN) 300 MG capsule TAKE ONE CAPSULE BY MOUTH EVERY MORNING AND TWO CAPSULES IN THE EVENING (Patient taking differently: TAKE TWO CAPSULES BY MOUTH TWICE DAILY) 30 capsule  0 05/19/2015 at Unknown time  . HYDROcodone-acetaminophen (NORCO) 10-325 MG per tablet Take 1 tablet by mouth 2 (two) times daily. FOR HEADACHE PAIN   05/19/2015 at Unknown time  . ketoconazole (NIZORAL) 2 % shampoo Apply 1 application topically 3 (three) times a week.    Past Week at Unknown time  . methotrexate (RHEUMATREX) 2.5 MG tablet Take 7.5 mg by mouth 2 (two) times a week. *TAKE 3 TABLETS BY MOUTH TWICE WEEKLY ON FRIDAYS AND SATURDAYS*   05/16/2015  . mometasone (NASONEX) 50 MCG/ACT nasal spray Place 2 sprays into the nose daily.   05/19/2015 at Unknown time  . omeprazole (PRILOSEC) 40 MG capsule Take 40 mg by mouth daily.   05/19/2015 at Unknown time  . oxyCODONE-acetaminophen (PERCOCET) 10-325 MG per tablet Take 1  tablet by mouth every 6 (six) hours as needed for pain.   05/19/2015 at Unknown time  . potassium chloride SA (K-DUR,KLOR-CON) 20 MEQ tablet Take 20 mEq by mouth 2 (two) times daily.    05/19/2015 at Unknown time  . tadalafil (CIALIS) 5 MG tablet Take 5 mg by mouth daily.   UNKNOWN  . Vitamin D, Ergocalciferol, (DRISDOL) 50000 UNITS CAPS capsule Take 1 capsule by mouth once a week.  4 Past Week at Unknown time  . warfarin (COUMADIN) 2 MG tablet Take 5 mg by mouth See admin instructions. No medication on Wednesday. 5 on al other days   05/18/2015 at 2000  . docusate sodium 100 MG CAPS Take 100 mg by mouth daily as needed for mild constipation. (Patient not taking: Reported on 05/19/2015) 10 capsule 0   . furosemide (LASIX) 40 MG tablet Take 1 tablet (40 mg total) by mouth daily. *Sometimes takes two tablets in the morning and one tablet in the evening as regimen depending on fluid retention (Patient not taking: Reported on 05/19/2015) 30 tablet 0   . Omega-3 Fatty Acids (FISH OIL) 1000 MG CAPS Take 2 capsules by mouth 2 (two) times daily.    Not Taking at Unknown time  . spironolactone (ALDACTONE) 25 MG tablet Take 0.5 tablets (12.5 mg total) by mouth daily. (Patient not taking: Reported on 05/19/2015) 30 tablet 0    Assessment: 81 yo M on chronic Coumadin for afib.  Home dose listed above.  INR below goal on admission, but is now in goal range.    He was started on Levaquin this admission which can elevate INR response.  Scr continues to trend up- estimated CrCl <12ml/min.  No bleeding noted.   Goal of Therapy:  INR 2-3   Plan:  Repeat Warfarin 2mg  PO x 1  Daily PT/INR   Biagio Borg 05/24/2015,8:47 AM

## 2015-05-24 NOTE — Progress Notes (Signed)
Notified that patient had a 2.07 sec pause. Upon assessment patient was resting and asymptomatic. Patient currently a fib on cardiac monitor, which he has a history of. MD notified, no new orders at this time. Will continue to monitor patient status.

## 2015-05-25 LAB — BASIC METABOLIC PANEL
ANION GAP: 7 (ref 5–15)
BUN: 27 mg/dL — ABNORMAL HIGH (ref 6–20)
CO2: 25 mmol/L (ref 22–32)
Calcium: 8.6 mg/dL — ABNORMAL LOW (ref 8.9–10.3)
Chloride: 105 mmol/L (ref 101–111)
Creatinine, Ser: 1.27 mg/dL — ABNORMAL HIGH (ref 0.61–1.24)
GFR calc non Af Amer: 51 mL/min — ABNORMAL LOW (ref >60)
GFR, EST AFRICAN AMERICAN: 59 mL/min — AB (ref >60)
GLUCOSE: 114 mg/dL — AB (ref 65–99)
Potassium: 4.2 mmol/L (ref 3.5–5.1)
Sodium: 137 mmol/L (ref 135–145)

## 2015-05-25 LAB — CBC
HCT: 31.3 % — ABNORMAL LOW (ref 39.0–52.0)
Hemoglobin: 10.3 g/dL — ABNORMAL LOW (ref 13.0–17.0)
MCH: 32 pg (ref 26.0–34.0)
MCHC: 32.9 g/dL (ref 30.0–36.0)
MCV: 97.2 fL (ref 78.0–100.0)
PLATELETS: 112 10*3/uL — AB (ref 150–400)
RBC: 3.22 MIL/uL — ABNORMAL LOW (ref 4.22–5.81)
RDW: 14.9 % (ref 11.5–15.5)
WBC: 8 10*3/uL (ref 4.0–10.5)

## 2015-05-25 LAB — PROTIME-INR
INR: 2.61 — AB (ref 0.00–1.49)
PROTHROMBIN TIME: 27.6 s — AB (ref 11.6–15.2)

## 2015-05-25 LAB — DIGOXIN LEVEL: Digoxin Level: 0.6 ng/mL — ABNORMAL LOW (ref 0.8–2.0)

## 2015-05-25 MED ORDER — DILTIAZEM HCL ER COATED BEADS 360 MG PO CP24: 360.0000 mg | ORAL_CAPSULE | Freq: Every day | ORAL | Status: DC

## 2015-05-25 MED ORDER — WARFARIN SODIUM 2 MG PO TABS: 2.0000 mg | ORAL_TABLET | Freq: Once | ORAL | Status: DC

## 2015-05-25 MED ORDER — LEVOFLOXACIN 750 MG PO TABS: 750.0000 mg | ORAL_TABLET | Freq: Every day | ORAL | Status: DC

## 2015-05-25 MED ORDER — FUROSEMIDE 40 MG PO TABS
20.0000 mg | ORAL_TABLET | Freq: Every day | ORAL | Status: DC
Start: 2015-05-25 — End: 2015-10-02

## 2015-05-25 MED ORDER — BENZONATATE 100 MG PO CAPS: 100.0000 mg | ORAL_CAPSULE | Freq: Three times a day (TID) | ORAL | Status: DC | PRN

## 2015-05-25 MED ORDER — DIGOXIN 125 MCG PO TABS: ORAL_TABLET | ORAL | Status: DC

## 2015-05-25 MED ORDER — TAMSULOSIN HCL 0.4 MG PO CAPS: 0.4000 mg | ORAL_CAPSULE | Freq: Every day | ORAL | Status: AC

## 2015-05-25 MED ORDER — POTASSIUM CHLORIDE CRYS ER 20 MEQ PO TBCR: 10.0000 meq | EXTENDED_RELEASE_TABLET | Freq: Every day | ORAL | Status: DC

## 2015-05-25 NOTE — Care Management Note (Signed)
Case Management Note  Patient Details  Name: [name] MRN: [id number] Date of Birth: 10-16-1931  Expected Discharge Date:                  Expected Discharge Plan:  Home/Self Care  In-House Referral:  NA  Discharge planning Services  CM Consult  Post Acute Care Choice:  NA Choice offered to:  NA  DME Arranged:    DME Agency:     HH Arranged:    [place] Agency:     Status of Service:  Completed, signed off  Medicare Important Message Given:  Yes Date Medicare IM Given:  05/22/15 Medicare IM give by:  Christinia Gully, RN BSN CM Date Additional Medicare IM Given:  05/25/15 Additional Medicare Important Message give by:  Jolene Provost, RN, CM, MSN  If discussed at [place] of Stay Meetings, dates discussed:    Additional Comments: Patient being discharged home today with self care. Pt does not qualify for home O2. Pt has no CM needs at the time of DC.   Sherald Barge, RN 05/25/2015, 1:46 PM

## 2015-05-25 NOTE — Progress Notes (Signed)
[facility] for Warfarin Indication: atrial fibrillation  Allergies  Allergen Reactions  . Cymbalta [Duloxetine Hcl] Other (See Comments)    Confusion   . Procaine Hcl Hives and Other (See Comments)    NOVOCAINE: Sweating, Confusion, Not in right state of mind.  [name]    unknown   Patient Measurements: Height: 5\' 8"  (172.7 cm) Weight: 209 lb (94.802 kg) IBW/kg (Calculated) : 68.4  Vital Signs: Temp: 97.3 F (36.3 C) (05/30 0618) Temp Source: Oral (05/30 0618) BP: 115/70 mmHg (05/30 0618) Pulse Rate: 83 (05/30 0618)  Labs:  Recent Labs  05/23/15 0616 05/23/15 0827 05/24/15 0556 05/25/15 0627  HGB 11.0*  --  10.7* 10.3*  HCT 33.8*  --  33.1* 31.3*  PLT 113*  --  105* 112*  LABPROT 25.9*  --  29.7* 27.6*  INR 2.40*  --  2.88* 2.61*  CREATININE 1.75* 1.70* 1.65* 1.27*   Estimated Creatinine Clearance: 51 mL/min (by C-G formula based on Cr of 1.27).  Medical History: Past Medical History  Diagnosis Date  . COPD (chronic obstructive pulmonary disease)     Uses occasional nighttime O2  . Disseminated herpes zoster 2010  . GERD (gastroesophageal reflux disease)   . Asthma   . Pulmonary embolus     2003 and 2011  . Pulmonary nodules     Chest CT 09/11  . Mixed hyperlipidemia   . Chronic atrial fibrillation   . Lupus anticoagulant positive   . Essential hypertension, benign   . Cholelithiasis   . Rheumatoid arthritis(714.0)   . History of chicken pox 1941; 2011  . Anemia   . Chronic lower back pain   . Chronic diastolic heart failure   . Cirrhosis     Questionable, AFP normal Feb 01/2012, hx of ETOH use   . Headache(784.0)   . Cervical spondylosis without myelopathy   . Stage III chronic kidney disease   . Cellulitis of leg, left 01/03/2015  . Aortic stenosis, moderate 05/21/2015   Medications:  Prescriptions prior to admission  Medication Sig Dispense Refill Last Dose  .  albuterol (PROAIR HFA) 108 (90 BASE) MCG/ACT inhaler Inhale 2 puffs into the lungs every 6 (six) hours as needed. Shortness of Breath   05/18/2015 at Unknown time  . albuterol (PROVENTIL) (2.5 MG/3ML) 0.083% nebulizer solution Take 2.5 mg by nebulization every 6 (six) hours as needed. Shortness of Breath   Past Month at Unknown time  . cetirizine (ZYRTEC) 10 MG tablet Take 10 mg by mouth daily.     05/18/2015 at Unknown time  . digoxin (LANOXIN) 0.125 MG tablet Take 0.125 mg by mouth daily.   05/19/2015 at Unknown time  . diltiazem (CARDIZEM CD) 300 MG 24 hr capsule Take 1 capsule (300 mg total) by mouth daily. (Patient taking differently: Take 300 mg by mouth every evening. ) 30 capsule 11 05/18/2015 at Unknown time  . Etanercept (ENBREL) 25 MG/0.5ML SOSY Inject into the skin 2 (two) times a week.    05/15/2015  . ferrous sulfate 325 (65 FE) MG tablet Take 325 mg by mouth daily.   05/19/2015 at Unknown time  . folic acid (FOLVITE) 1 MG tablet Take 1 mg by mouth daily.    05/19/2015 at Unknown time  . gabapentin (NEURONTIN) 300 MG capsule TAKE ONE CAPSULE BY MOUTH EVERY MORNING AND TWO CAPSULES IN THE EVENING (Patient taking differently: TAKE TWO CAPSULES BY MOUTH TWICE DAILY) 30 capsule 0 05/19/2015  at Unknown time  . HYDROcodone-acetaminophen (NORCO) 10-325 MG per tablet Take 1 tablet by mouth 2 (two) times daily. FOR HEADACHE PAIN   05/19/2015 at Unknown time  . ketoconazole (NIZORAL) 2 % shampoo Apply 1 application topically 3 (three) times a week.    Past Week at Unknown time  . methotrexate (RHEUMATREX) 2.5 MG tablet Take 7.5 mg by mouth 2 (two) times a week. *TAKE 3 TABLETS BY MOUTH TWICE WEEKLY ON FRIDAYS AND SATURDAYS*   05/16/2015  . mometasone (NASONEX) 50 MCG/ACT nasal spray Place 2 sprays into the nose daily.   05/19/2015 at Unknown time  . omeprazole (PRILOSEC) 40 MG capsule Take 40 mg by mouth daily.   05/19/2015 at Unknown time  . oxyCODONE-acetaminophen (PERCOCET) 10-325 MG per tablet Take 1  tablet by mouth every 6 (six) hours as needed for pain.   05/19/2015 at Unknown time  . potassium chloride SA (K-DUR,KLOR-CON) 20 MEQ tablet Take 20 mEq by mouth 2 (two) times daily.    05/19/2015 at Unknown time  . tadalafil (CIALIS) 5 MG tablet Take 5 mg by mouth daily.   UNKNOWN  . Vitamin D, Ergocalciferol, (DRISDOL) 50000 UNITS CAPS capsule Take 1 capsule by mouth once a week.  4 Past Week at Unknown time  . warfarin (COUMADIN) 2 MG tablet Take 5 mg by mouth See admin instructions. No medication on Wednesday. 5 on al other days   05/18/2015 at 2000  . docusate sodium 100 MG CAPS Take 100 mg by mouth daily as needed for mild constipation. (Patient not taking: Reported on 05/19/2015) 10 capsule 0   . furosemide (LASIX) 40 MG tablet Take 1 tablet (40 mg total) by mouth daily. *Sometimes takes two tablets in the morning and one tablet in the evening as regimen depending on fluid retention (Patient not taking: Reported on 05/19/2015) 30 tablet 0   . Omega-3 Fatty Acids (FISH OIL) 1000 MG CAPS Take 2 capsules by mouth 2 (two) times daily.    Not Taking at Unknown time  . spironolactone (ALDACTONE) 25 MG tablet Take 0.5 tablets (12.5 mg total) by mouth daily. (Patient not taking: Reported on 05/19/2015) 30 tablet 0    Assessment: 82 yo M on chronic Coumadin for afib.  Home dose listed above.  INR below goal on admission, but is now in goal range.    He was started on Levaquin this admission which can elevate INR response.  Renal function improved.  No bleeding noted.   Goal of Therapy:  INR 2-3   Plan:  Repeat Warfarin 2mg  PO x 1  Daily PT/INR Increase Levaquin back to 750mg  PO q24h -->Day#7 of therapy   Biagio Borg 05/25/2015,9:19 AM

## 2015-05-25 NOTE — Progress Notes (Signed)
SATURATION QUALIFICATIONS: (This note is used to comply with regulatory documentation for home oxygen)  Patient Saturations on Room Air at Rest = 94%  Patient Saturations on Room Air while Ambulating = 92%  Patient Saturations on  Liters of oxygen while Ambulating = %  Please briefly explain why patient needs home oxygen: 

## 2015-05-25 NOTE — Progress Notes (Signed)
Discharge instruction reviewed with patient and patient's   Daughter. Prescription given to patient. Taken to lobby via wheelchair.

## 2015-05-25 NOTE — Discharge Summary (Signed)
Physician Discharge Summary  [name] TKK:[id number] DOB: 10-01-1931 DOA: 05/19/2015  PCP: [name], MD  Admit date: 05/19/2015 Discharge date: 05/25/2015  Time spent: Greater than 30 minutes  Recommendations for Outpatient Follow-up:  1. Recommend follow-up of the patient's heart rate and fluid status. 2. Recommend follow-up of the patient's INR and renal function.    Discharge Diagnoses:  1. COPD with exacerbation. 2. Chronic atrial fibrillation with RVR. On chronic anticoagulation with warfarin. -Reported 2 second pause. 3. Mild acute on chronic diastolic heart failure. 4. Moderate to severe aortic stenosis with ejection fraction of 65-70% per echo. 5. Essential hypertension. 6. Fever, thought to be secondary to COPD exacerbation. 7. Acute kidney injury secondary to prerenal azotemia.. 8. Hyponatremia, secondary to hypovolemia. 9. Thrombocytopenia, chronic, possibly medication induced.  Discharge Condition: Improved.  Diet recommendation: Heart healthy.   Filed Weights   05/22/15 0500 05/24/15 0500 05/25/15 0500  Weight: 90.13 kg (198 lb 11.2 oz) 93.668 kg (206 lb 8 oz) 94.802 kg (209 lb)    History of present illness:  The patient is an 81 year old man with a history of COPD, chronic atrial fibrillation on Coumadin, and hypertension, who presented to the emergency department on 05/19/15 with a complaint of shortness of breath and cough. In the ED, he was mildly febrile and tachycardic with a heart rate ranging from 103-150 bpm. His chest x-ray revealed a diffusely increased interstitial densities bilaterally, consistent with pulmonary edema and a mild right pleural effusion. His EKG revealed atrial ablation with a heart rate of 112 bpm. His proBNP was 413. His troponin I was normal. His INR was 1.68. His WBC was 11.2, hemoglobin 12.7, and platelet count 104. He was admitted for further evaluation and management.  Hospital Course:  1. COPD exacerbation. The  patient has a history of COPD, non-oxygen dependent. He stopped smoking years ago. Blood cultures were ordered in the emergency department. He was started on IV Levaquin. Xopenex and Atrovent nebulizers were added subsequently at every 6 hours. When necessary Tussionex and scheduled 3 times a day Tessalon Perles were ordered for his cough. Pulmicort nebulizer was added. A short course of prednisone was also given. He became afebrile and remained afebrile. Blood cultures were negative to date. He improved clinically and symptomatically. He completed the course of antibiotic and steroid therapy during hospitalization. At the time of hospital discharge, he was oxygenating 93-95% on room air. Fever. This is presumed to be secondary to COPD with exacerbation. The patient had some immunocompromise on Enbrel and methotrexate for treatment of rheumatoid arthritis. These medications were held during hospitalization, but he was instructed to resume them upon discharge. His blood cultures were negative to date. His influenza panel was negative. His fever resolved. Acute on chronic diastolic heart failure. On admission, the patient's chest x-ray was indicative of some pulmonary edema. His proBNP was only 413. His troponin I was within normal limits. The patient was started on IV Lasix. Cardiology was consulted and noted that the patient has a history of diastolic dysfunction with an EF of 70-75% and mild aortic stenosis per previous echo in 2014. Results of the 2-D echocardiogram during this admission revealed indeterminate diastolic function, moderate to severe aortic valve stenosis which has increased, and ejection fraction of 65-70%. Patient's TSH was within normal limits at 1.0. The patient's in/out were about even.His follow-up chest x-ray revealed a decrease in pulmonary edema. The dose of Lasix was decreased, but was eventually held for 24 hours when his renal function worsened.  With improvement in his  renal function, he was instructed to take 20 g of Lasix daily upon discharge. Chronic atrial fibrillation; with RVR. The patient's heart rate was elevated on admission. It increased periodically during hospitalization, which was thought to be secondary to fever and nebulizers. He was continued on digoxin and Cardizem. Cardiology increased his diltiazem to 360 mg daily. Digoxin was continued, but later in the course, the patient developed a 2 second pause. Digoxin level was assessed and was 0.6. Although it was subtherapeutic, the patient was instructed to take digoxin every other day rather than daily because of the sinus pause. Coumadin was continued per pharmacy and was essentially therapeutic throughout hospitalization. Acute kidney injury and hyponatremia. The patient's serum sodium was 139 and his serum creatinine was 1.09 on admission. Since the start of IV Lasix, his creatinine had increased to 1.75 and his serum sodium decreased to 131. This was likely the consequence of volume contraction or hypoperfusion given low-normal blood pressures. Lasix was withheld and he was started on gentle IV fluids on 5/28. He was started on Flomax for urinary frequency and hesitancy. His renal ultrasound revealed no evidence of hydronephrosis. His serum sodium improved to 137 and his creatinine improved 1.27 at the time of discharge. Thrombocytopenia. The patient has a history of thrombocytopenia with a baseline ranging from 74-125. Etiology was unknown unknown but could be secondary to Enbrel and methotrexate. His TSH was within normal limits. His vitamin B12 level was within normal limits at 507. His platelet count remained at baseline and was 112 at discharge.     Procedures:  2-D echocardiogram:Study Conclusions - Left ventricle: The cavity size was normal. Wall thickness was increased in a pattern of mild LVH. Systolic function was vigorous. The estimated ejection fraction was in the range  of 65% to 70%. Wall motion was normal; there were no regional wall motion abnormalities. The study is not technically sufficient to allow evaluation of LV diastolic function. - Aortic valve: There was moderate to severe stenosis. Mean gradient (S): 23 mm Hg. Peak gradient (S): 54 mm Hg. VTI ratio of LVOT to aortic valve: 0.34. Valve area (VTI): 1.07 cm^2. Valve area (Vmax): 1.08 cm^2. Valve area (Vmean): 1.21 cm^2. - Mitral valve: Calcified annulus. Mildly calcified leaflets . Mean gradient (D): 4 mm Hg. Valve area by pressure half-time: 2.24 cm^2. Valve area by continuity equation (using LVOT flow): 1.67 cm^2. - Left atrium: The atrium was moderately dilated. - Right atrium: Central venous pressure (est): 3 mm Hg. - Atrial septum: There was increased thickness of the septum, consistent with lipomatous hypertrophy. No defect or patent foramen ovale was identified. - Tricuspid valve: There was trivial regurgitation. - Pulmonary arteries: Systolic pressure could not be accurately estimated. - Pericardium, extracardiac: There was no pericardial effusion. Impressions: - Mild LVH with LVEF 65-70%, indeterminate diastolic function. Moderate left atrial enlargement. Moderate to severe calcific aortic stenosis as outlined above - there has been progression compared to the prior study. Trivial tricuspid regurgitation, unable to assess PASP.  Consultations:  Cardiology  Discharge Exam: Filed Vitals:   05/25/15 0618  BP: 115/70  Pulse: 83  Temp: 97.3 F (36.3 C)  Resp: 20   oxygen saturation 95% on room air  General: [name] in no acute distress.  Cardiovascular: Irregular, irregular, with a 2 to 3/6 systolic murmur.  Respiratory: Occasional crackle/wheezes, breathing nonlabored.  Abdomen: Positive bowel sounds, soft, nontender, non-distended.  Musculoskeletal/extremities: No pedal edema. No acute hot red joints.  Neurologic:  He is alert and oriented 2. Cranial nerves II through XII are grossly intact.  Discharge Instructions   Discharge Instructions    Diet - low sodium heart healthy    Complete by:  As directed      Discharge instructions    Complete by:  As directed   The dosage of Cardizem was changed. See the medication list. The dose of Lasix was changed. See medication list. The dose of potassium supplement was changed. See medication list. Call for follow-up to see the cardiologist and your primary care physician. You will need your blood work rechecked in 1 week or less.     Increase activity slowly    Complete by:  As directed           Current Discharge Medication List    START taking these medications   Details  benzonatate (TESSALON) 100 MG capsule Take 1 capsule (100 mg total) by mouth 3 (three) times daily as needed for cough. Qty: 20 capsule, Refills: 0    tamsulosin (FLOMAX) 0.4 MG CAPS capsule Take 1 capsule (0.4 mg total) by mouth at bedtime. For prostate treatment. Qty: 30 capsule, Refills: 3      CONTINUE these medications which have CHANGED   Details  digoxin (LANOXIN) 0.125 MG tablet Take this medication EVERY OTHER DAY starting Tuesday 05/26/15.    diltiazem (CARDIZEM CD) 360 MG 24 hr capsule Take 1 capsule (360 mg total) by mouth daily. Qty: 30 capsule, Refills: 3    furosemide (LASIX) 40 MG tablet Take 0.5 tablets (20 mg total) by mouth daily. Take daily for prevention of fluid buildup.    potassium chloride SA (K-DUR,KLOR-CON) 20 MEQ tablet Take 0.5 tablets (10 mEq total) by mouth daily.      CONTINUE these medications which have NOT CHANGED   Details  albuterol (PROAIR HFA) 108 (90 BASE) MCG/ACT inhaler Inhale 2 puffs into the lungs every 6 (six) hours as needed. Shortness of Breath    albuterol (PROVENTIL) (2.5 MG/3ML) 0.083% nebulizer solution Take 2.5 mg by nebulization every 6 (six) hours as needed. Shortness of Breath    cetirizine (ZYRTEC) 10 MG tablet  Take 10 mg by mouth daily.      Etanercept (ENBREL) 25 MG/0.5ML SOSY Inject into the skin 2 (two) times a week.     ferrous sulfate 325 (65 FE) MG tablet Take 325 mg by mouth daily.    folic acid (FOLVITE) 1 MG tablet Take 1 mg by mouth daily.     gabapentin (NEURONTIN) 300 MG capsule TAKE ONE CAPSULE BY MOUTH EVERY MORNING AND TWO CAPSULES IN THE EVENING Qty: 30 capsule, Refills: 0    ketoconazole (NIZORAL) 2 % shampoo Apply 1 application topically 3 (three) times a week.     methotrexate (RHEUMATREX) 2.5 MG tablet Take 7.5 mg by mouth 2 (two) times a week. *TAKE 3 TABLETS BY MOUTH TWICE WEEKLY ON FRIDAYS AND SATURDAYS*    mometasone (NASONEX) 50 MCG/ACT nasal spray Place 2 sprays into the nose daily.    omeprazole (PRILOSEC) 40 MG capsule Take 40 mg by mouth daily.    oxyCODONE-acetaminophen (PERCOCET) 10-325 MG per tablet Take 1 tablet by mouth every 6 (six) hours as needed for pain.    tadalafil (CIALIS) 5 MG tablet Take 5 mg by mouth daily.    Vitamin D, Ergocalciferol, (DRISDOL) 50000 UNITS CAPS capsule Take 1 capsule by mouth once a week. Refills: 4    warfarin (COUMADIN) 2 MG tablet Take 5 mg by  mouth See admin instructions. No medication on Wednesday. 5 on al other days    Omega-3 Fatty Acids (FISH OIL) 1000 MG CAPS Take 2 capsules by mouth 2 (two) times daily.       STOP taking these medications     HYDROcodone-acetaminophen (NORCO) 10-325 MG per tablet      docusate sodium 100 MG CAPS      spironolactone (ALDACTONE) 25 MG tablet      VOLTAREN 1 % GEL        Allergies  Allergen Reactions  . Cymbalta [Duloxetine Hcl] Other (See Comments)    Confusion   . Procaine Hcl Hives and Other (See Comments)    NOVOCAINE: Sweating, Confusion, Not in right state of mind.  [name]    unknown   Follow-up Information    Follow up with Rozann Lesches, MD In 1 week.   Specialty:  Cardiology   Why:  For follow-up of your heart and  medications.   Contact information:   [place] [address] [phone]       Follow up with [name], MD In 2 weeks.   Specialty:  Family Medicine   Why:  For hospital follow-up   Contact information:   [address] [place] [address] [phone]        The results of significant diagnostics from this hospitalization (including imaging, microbiology, ancillary and laboratory) are listed below for reference.    Significant Diagnostic Studies: Dg Chest 2 View  05/22/2015   CLINICAL DATA:  Congestive heart failure  EXAM: CHEST  2 VIEW  COMPARISON:  PA and lateral chest of May 19, 2015  FINDINGS: The lungs remain hyperinflated. There remains a small right pleural effusion. The interstitial markings remain mildly increased but have improved slightly since the earlier study. The cardiac silhouette is top-normal in size. The pulmonary vascularity is less engorged. The mediastinum is normal in width. The bony thorax exhibits no acute abnormality. There is chronic compression of T4.  IMPRESSION: Improving CHF superimposed upon COPD. There is a stable small right pleural effusion.   Electronically Signed   By: David  Martinique M.D.   On: 05/22/2015 09:25   Dg Chest 2 View  05/19/2015   CLINICAL DATA:  Shortness of breath, cough.  EXAM: CHEST  2 VIEW  COMPARISON:  July 05, 2013.  FINDINGS: The heart size and mediastinal contours are within normal limits. Diffusely increased interstitial densities are noted throughout both lungs most consistent with edema. There is interval development of mild right pleural effusion. No pneumothorax is noted. The visualized skeletal structures are unremarkable.  IMPRESSION: Diffusely increased interstitial densities are noted bilaterally consistent with pulmonary edema. Mild right pleural effusion is noted as well.   Electronically Signed   By: Marijo Conception, M.D.   On: 05/19/2015 14:46   US Renal  05/24/2015   CLINICAL DATA:   Acute kidney injury.  EXAM: RENAL / URINARY TRACT ULTRASOUND COMPLETE  COMPARISON:  03/17/2015  FINDINGS: Right Kidney:  Length: 11.5 cm. Mild cortical thinning. Normal parenchymal echogenicity. 16 mm mid to upper pole cyst. 16 mm upper pole, exophytic cyst. No other masses. No stones. No hydronephrosis.  Left Kidney:  Length: 12.5 cm. Mid to upper pole scarring. 2.7 cm upper pole exophytic cyst. No other renal masses, no stones and no hydronephrosis.  Bladder:  Appears normal for degree of bladder distention.  Right pleural effusion.  IMPRESSION: 1. No hydronephrosis. 2. Upper  pole renal cortical thinning on the right. The 2 upper pole renal scarring on the left. Bilateral renal cysts. 3. Midpole left renal cyst seen on the prior study not resolved currently. Other cysts are relatively stable.   Electronically Signed   By: Lajean Manes M.D.   On: 05/24/2015 11:48    Microbiology: Recent Results (from the past 240 hour(s))  Culture, blood (routine x 2)     Status: None   Collection Time: 05/19/15  9:00 PM  Result Value Ref Range Status   Specimen Description BLOOD RIGHT ARM  Final   Special Requests BOTTLES DRAWN AEROBIC AND ANAEROBIC 6CC  Final   Culture NO GROWTH 5 DAYS  Final   Report Status 05/24/2015 FINAL  Final  Culture, blood (routine x 2)     Status: None   Collection Time: 05/19/15  9:05 PM  Result Value Ref Range Status   Specimen Description BLOOD RIGHT HAND  Final   Special Requests BOTTLES DRAWN AEROBIC AND ANAEROBIC 6CC  Final   Culture NO GROWTH 5 DAYS  Final   Report Status 05/24/2015 FINAL  Final  MRSA PCR Screening     Status: None   Collection Time: 05/20/15 12:02 AM  Result Value Ref Range Status   MRSA by PCR NEGATIVE NEGATIVE Final    Comment:        The GeneXpert MRSA Assay (FDA approved for NASAL specimens only), is one component of a comprehensive MRSA colonization surveillance program. It is not intended to diagnose MRSA infection nor to guide or monitor  treatment for MRSA infections.      Labs: Basic Metabolic Panel:  Recent Labs Lab 05/22/15 0553 05/23/15 0616 05/23/15 0827 05/24/15 0556 05/25/15 0627  NA 137 134* 131* 136 137  K 3.9 6.1* 4.9 4.7 4.2  CL 101 99* 96* 102 105  CO2 27 27 26 27 25   GLUCOSE 100* 132* 152* 117* 114*  BUN 22* 29* 29* 31* 27*  CREATININE 1.32* 1.75* 1.70* 1.65* 1.27*  CALCIUM 8.4* 8.7* 8.7* 8.7* 8.6*   Liver Function Tests:  Recent Labs Lab 05/19/15 1545 05/20/15 0533  AST 23 21  ALT 14* 13*  ALKPHOS 48 48  BILITOT 3.3* 3.8*  PROT 8.2* 8.2*  ALBUMIN 3.6 3.4*   No results for input(s): LIPASE, AMYLASE in the last 168 hours. No results for input(s): AMMONIA in the last 168 hours. CBC:  Recent Labs Lab 05/21/15 0613 05/22/15 0553 05/23/15 0616 05/24/15 0556 05/25/15 0627  WBC 7.5 7.6 9.8 7.2 8.0  HGB 12.2* 10.9* 11.0* 10.7* 10.3*  HCT 37.9* 33.7* 33.8* 33.1* 31.3*  MCV 98.2 98.0 98.5 97.9 97.2  PLT 119* 105* 113* 105* 112*   Cardiac Enzymes:  Recent Labs Lab 05/19/15 1545  TROPONINI <0.03   BNP: BNP (last 3 results)  Recent Labs  05/19/15 1545 05/20/15 0533  BNP 413.0* 382.0*    ProBNP (last 3 results) No results for input(s): PROBNP in the last 8760 hours.  CBG: No results for input(s): GLUCAP in the last 168 hours.     Signed:  Tabrina Esty  [facility] 05/25/2015, 11:53 AM

## 2015-06-05 ENCOUNTER — Encounter: Payer: Self-pay | Admitting: Cardiology

## 2015-06-05 ENCOUNTER — Ambulatory Visit (INDEPENDENT_AMBULATORY_CARE_PROVIDER_SITE_OTHER): Payer: Medicare Other | Admitting: Cardiology

## 2015-06-05 VITALS — BP 124/64 | HR 71 | Ht 68.0 in | Wt 208.0 lb

## 2015-06-05 DIAGNOSIS — I482 Chronic atrial fibrillation, unspecified: Secondary | ICD-10-CM

## 2015-06-05 DIAGNOSIS — I350 Nonrheumatic aortic (valve) stenosis: Secondary | ICD-10-CM | POA: Diagnosis not present

## 2015-06-05 NOTE — Progress Notes (Signed)
Cardiology Office Note  Date: 06/05/2015   ID: [name], DOB 26-Nov-1931, MRN [id number]  PCP: [name], MD  Primary Cardiologist: Rozann Lesches, MD   Chief Complaint  Patient presents with  . Aortic valve disease  . Atrial Fibrillation    History of Present Illness: [name] is an 81 y.o. male last seen in January. He was recently hospitalized in May with possible bronchitis, also acute on chronic diastolic heart failure, and evidence of progressive aortic stenosis by echocardiogram, although not clearly concluding to his active symptoms. He presents today for a follow-up visit, states that he feels better. Reports occasional cough, no chest pain or wheezing.  Medications are outlined below. He did increase his Cardizem CD dose for better control of atrial fibrillation. Less he continues on Coumadin with no reported bleeding problems.  We discussed his progression in aortic stenosis, plan for symptom follow-up at this time. He will need an echocardiogram in approximately one year, sooner if symptoms escalate. We did touch on the types of procedures for valve replacement including surgery and TAVR. At this particular time, he stated that he was most comfortable with conservative measures.   Past Medical History  Diagnosis Date  . COPD (chronic obstructive pulmonary disease)     Uses occasional nighttime O2  . Disseminated herpes zoster 2010  . GERD (gastroesophageal reflux disease)   . Asthma   . Pulmonary embolus     2003 and 2011  . Pulmonary nodules     Chest CT 09/11  . Mixed hyperlipidemia   . Chronic atrial fibrillation   . Lupus anticoagulant positive   . Essential hypertension, benign   . Cholelithiasis   . Rheumatoid arthritis(714.0)   . History of chicken pox 1941; 2011  . Anemia   . Chronic lower back pain   . Chronic diastolic heart failure   . Cirrhosis     Questionable, AFP normal Feb 01/2012, hx of ETOH use   . Headache(784.0)     . Cervical spondylosis without myelopathy   . Stage III chronic kidney disease   . Cellulitis of leg, left 01/03/2015  . Aortic stenosis, moderate 05/21/2015     Current Outpatient Prescriptions  Medication Sig Dispense Refill  . albuterol (PROAIR HFA) 108 (90 BASE) MCG/ACT inhaler Inhale 2 puffs into the lungs every 6 (six) hours as needed. Shortness of Breath    . albuterol (PROVENTIL) (2.5 MG/3ML) 0.083% nebulizer solution Take 2.5 mg by nebulization every 6 (six) hours as needed. Shortness of Breath    . benzonatate (TESSALON) 100 MG capsule Take 1 capsule (100 mg total) by mouth 3 (three) times daily as needed for cough. 20 capsule 0  . budesonide-formoterol (SYMBICORT) 160-4.5 MCG/ACT inhaler Wash mouth out afterwards    . cetirizine (ZYRTEC) 10 MG tablet Take 10 mg by mouth daily.      . digoxin (LANOXIN) 0.125 MG tablet Take this medication EVERY OTHER DAY starting Tuesday 05/26/15.    . diltiazem (CARDIZEM CD) 360 MG 24 hr capsule Take 1 capsule (360 mg total) by mouth daily. 30 capsule 3  . Etanercept (ENBREL) 25 MG/0.5ML SOSY Inject into the skin 2 (two) times a week.     . ferrous sulfate 325 (65 FE) MG tablet Take 325 mg by mouth daily.    . folic acid (FOLVITE) 1 MG tablet Take 1 mg by mouth daily.     . furosemide (LASIX) 40 MG tablet Take 0.5 tablets (20 mg total) by  mouth daily. Take daily for prevention of fluid buildup.    . gabapentin (NEURONTIN) 300 MG capsule TAKE ONE CAPSULE BY MOUTH EVERY MORNING AND TWO CAPSULES IN THE EVENING (Patient taking differently: TAKE TWO CAPSULES BY MOUTH TWICE DAILY) 30 capsule 0  . ketoconazole (NIZORAL) 2 % shampoo Apply 1 application topically 3 (three) times a week.     . methotrexate (RHEUMATREX) 2.5 MG tablet Take 7.5 mg by mouth 2 (two) times a week. *TAKE 3 TABLETS BY MOUTH TWICE WEEKLY ON FRIDAYS AND SATURDAYS*    . mometasone (NASONEX) 50 MCG/ACT nasal spray Place 2 sprays into the nose daily.    . Omega-3 Fatty Acids (FISH OIL)  1000 MG CAPS Take 2 capsules by mouth 2 (two) times daily.     Marland Kitchen omeprazole (PRILOSEC) 40 MG capsule Take 40 mg by mouth daily.    Marland Kitchen oxyCODONE-acetaminophen (PERCOCET) 10-325 MG per tablet Take 1 tablet by mouth every 6 (six) hours as needed for pain.    . potassium chloride SA (K-DUR,KLOR-CON) 20 MEQ tablet Take 0.5 tablets (10 mEq total) by mouth daily.    . tadalafil (CIALIS) 5 MG tablet Take 5 mg by mouth daily.    . tamsulosin (FLOMAX) 0.4 MG CAPS capsule Take 1 capsule (0.4 mg total) by mouth at bedtime. For prostate treatment. 30 capsule 3  . Vitamin D, Ergocalciferol, (DRISDOL) 50000 UNITS CAPS capsule Take 1 capsule by mouth once a week.  4  . warfarin (COUMADIN) 2 MG tablet Take 5 mg by mouth See admin instructions. No medication on Wednesday. 5 on al other days     No current facility-administered medications for this visit.    Allergies:  Cymbalta; Procaine hcl; and Bystolic   Social History: The patient  reports that he quit smoking about 17 years ago. His smoking use included Cigarettes. He has a 57 pack-year smoking history. He has never used smokeless tobacco. He reports that he does not drink alcohol or use illicit drugs.   ROS:  Please see the history of present illness. Otherwise, complete review of systems is positive for chronic dyspnea exertion.  All other systems are reviewed and negative.   Physical Exam: VS:  BP 124/64 mmHg  Pulse 71  Ht 5\' 8"  (1.727 m)  Wt 208 lb (94.348 kg)  BMI 31.63 kg/m2  SpO2 96%, BMI Body mass index is 31.63 kg/(m^2).  Wt Readings from Last 3 Encounters:  06/05/15 208 lb (94.348 kg)  05/25/15 209 lb (94.802 kg)  01/08/15 200 lb 11.2 oz (91.037 kg)     Bearded male, using a cane to ambulate, comfortable at rest. HEENT: Conjunctiva and lids normal, oropharynx clear.  Neck: Supple, no carotid bruits, no thyromegaly.  Lungs: Clear to auscultation, diminished, nonlabored breathing at rest.  Cardiac: Irregularly irregular, no S3, 1-0/9  systolic murmur, no pericardial rub.  Abdomen: Soft, nontender, bowel sounds present.  Extremities: No pitting edema, distal pulses 2+.   ECG: Tracing from 05/20/2015 showed rapid atrial fibrillation with right lumbar branch block and left anterior fascicular block.  Recent Labwork: 05/19/2015: TSH 1.001 05/20/2015: ALT 13*; AST 21; B Natriuretic Peptide 382.0* 05/25/2015: BUN 27*; Creatinine, Ser 1.27*; Hemoglobin 10.3*; Platelets 112*; Potassium 4.2; Sodium 137   Other Studies Reviewed Today:  Echocardiogram 05/20/2015: Study Conclusions  - Left ventricle: The cavity size was normal. Wall thickness was increased in a pattern of mild LVH. Systolic function was vigorous. The estimated ejection fraction was in the range of 65% to 70%. Wall motion  was normal; there were no regional wall motion abnormalities. The study is not technically sufficient to allow evaluation of LV diastolic function. - Aortic valve: There was moderate to severe stenosis. Mean gradient (S): 23 mm Hg. Peak gradient (S): 54 mm Hg. VTI ratio of LVOT to aortic valve: 0.34. Valve area (VTI): 1.07 cm^2. Valve area (Vmax): 1.08 cm^2. Valve area (Vmean): 1.21 cm^2. - Mitral valve: Calcified annulus. Mildly calcified leaflets . Mean gradient (D): 4 mm Hg. Valve area by pressure half-time: 2.24 cm^2. Valve area by continuity equation (using LVOT flow): 1.67 cm^2. - Left atrium: The atrium was moderately dilated. - Right atrium: Central venous pressure (est): 3 mm Hg. - Atrial septum: There was increased thickness of the septum, consistent with lipomatous hypertrophy. No defect or patent foramen ovale was identified. - Tricuspid valve: There was trivial regurgitation. - Pulmonary arteries: Systolic pressure could not be accurately estimated. - Pericardium, extracardiac: There was no pericardial effusion.  Impressions:  - Mild LVH with LVEF 65-70%, indeterminate diastolic  function. Moderate left atrial enlargement. Moderate to severe calcific aortic stenosis as outlined above - there has been progression compared to the prior study. Trivial tricuspid regurgitation, unable to assess PASP.   ASSESSMENT AND PLAN:  1. Chronic atrial fibrillation, continue strategy of heart rate control and anticoagulation.  2. Moderate to severe calcific aortic stenosis by recent follow-up echocardiogram. Clinical follow-up to be arranged in 6 months.  Current medicines were reviewed at length with the patient today.  Disposition: FU with me in 6 months.   Signed, Satira Sark, MD, [facility] 06/05/2015 11:40 AM    [facility]. [address] [place], [place] [address] Phone: [phone]; Fax: [phone]

## 2015-06-05 NOTE — Patient Instructions (Signed)
Your physician wants you to follow-up in: 6 months with Dr.McDowell You will receive a reminder letter in the mail two months in advance. If you don't receive a letter, please call our office to schedule the follow-up appointment.     Your physician recommends that you continue on your current medications as directed. Please refer to the Current Medication list given to you today.     Thank you for choosing [facility] !        

## 2015-08-20 ENCOUNTER — Encounter (HOSPITAL_COMMUNITY): Payer: Self-pay | Admitting: Emergency Medicine

## 2015-08-20 ENCOUNTER — Emergency Department (HOSPITAL_COMMUNITY)
Admission: EM | Admit: 2015-08-20 | Discharge: 2015-08-20 | Disposition: A | Discharge status: Home / Self Care | Payer: Medicare Other | Attending: Emergency Medicine | Admitting: Emergency Medicine

## 2015-08-20 DIAGNOSIS — R51 Headache: Secondary | ICD-10-CM | POA: Insufficient documentation

## 2015-08-20 DIAGNOSIS — I482 Chronic atrial fibrillation: Secondary | ICD-10-CM | POA: Insufficient documentation

## 2015-08-20 DIAGNOSIS — I5032 Chronic diastolic (congestive) heart failure: Secondary | ICD-10-CM | POA: Diagnosis not present

## 2015-08-20 DIAGNOSIS — Z86711 Personal history of pulmonary embolism: Secondary | ICD-10-CM | POA: Insufficient documentation

## 2015-08-20 DIAGNOSIS — N183 Chronic kidney disease, stage 3 (moderate): Secondary | ICD-10-CM | POA: Diagnosis not present

## 2015-08-20 DIAGNOSIS — D649 Anemia, unspecified: Secondary | ICD-10-CM | POA: Insufficient documentation

## 2015-08-20 DIAGNOSIS — Z7951 Long term (current) use of inhaled steroids: Secondary | ICD-10-CM | POA: Insufficient documentation

## 2015-08-20 DIAGNOSIS — K219 Gastro-esophageal reflux disease without esophagitis: Secondary | ICD-10-CM | POA: Insufficient documentation

## 2015-08-20 DIAGNOSIS — R7989 Other specified abnormal findings of blood chemistry: Secondary | ICD-10-CM | POA: Diagnosis present

## 2015-08-20 DIAGNOSIS — G8929 Other chronic pain: Secondary | ICD-10-CM | POA: Diagnosis not present

## 2015-08-20 DIAGNOSIS — Z7901 Long term (current) use of anticoagulants: Secondary | ICD-10-CM | POA: Diagnosis not present

## 2015-08-20 DIAGNOSIS — Z8619 Personal history of other infectious and parasitic diseases: Secondary | ICD-10-CM | POA: Diagnosis not present

## 2015-08-20 DIAGNOSIS — R791 Abnormal coagulation profile: Secondary | ICD-10-CM | POA: Insufficient documentation

## 2015-08-20 DIAGNOSIS — I129 Hypertensive chronic kidney disease with stage 1 through stage 4 chronic kidney disease, or unspecified chronic kidney disease: Secondary | ICD-10-CM | POA: Diagnosis not present

## 2015-08-20 DIAGNOSIS — Z79899 Other long term (current) drug therapy: Secondary | ICD-10-CM | POA: Diagnosis not present

## 2015-08-20 DIAGNOSIS — J449 Chronic obstructive pulmonary disease, unspecified: Secondary | ICD-10-CM | POA: Diagnosis not present

## 2015-08-20 DIAGNOSIS — Z872 Personal history of diseases of the skin and subcutaneous tissue: Secondary | ICD-10-CM | POA: Diagnosis not present

## 2015-08-20 DIAGNOSIS — Z87891 Personal history of nicotine dependence: Secondary | ICD-10-CM | POA: Insufficient documentation

## 2015-08-20 DIAGNOSIS — R195 Other fecal abnormalities: Secondary | ICD-10-CM | POA: Diagnosis not present

## 2015-08-20 DIAGNOSIS — M069 Rheumatoid arthritis, unspecified: Secondary | ICD-10-CM | POA: Diagnosis not present

## 2015-08-20 MED ORDER — PHYTONADIONE 5 MG PO TABS
2.5000 mg | ORAL_TABLET | Freq: Once | ORAL | Status: AC
  Administered 2015-08-20: 2.5 mg via ORAL
  Filled 2015-08-20: qty 1

## 2015-08-20 NOTE — ED Notes (Signed)
Had Coumadin levels checked today and was told he needed to come to the hospital "to get a vitamin K shot".  Not having any other symptoms

## 2015-08-20 NOTE — ED Provider Notes (Signed)
CSN: [id number]     Arrival date & time 08/20/15  1932 History   First MD Initiated Contact with Patient 08/20/15 1946     Chief Complaint  Patient presents with  . Abnormal Lab     (Consider location/radiation/quality/duration/timing/severity/associated sxs/prior Treatment) Patient is a 81 y.o. male presenting with general illness. The history is provided by the patient.  Illness Severity:  Mild Onset quality:  Sudden Duration:  1 day Timing:  Constant Progression:  Unchanged Chronicity:  New Associated symptoms: headaches (chronic and unchanged)   Associated symptoms: no abdominal pain, no chest pain, no congestion, no diarrhea, no fever, no myalgias, no nausea, no rash, no shortness of breath and no vomiting    81 yo M with a chief complaint of an elevated INR. Patient states that this was discovered at his [facility] today. He is told to come to the hospital and get a vitamin K injection. Patient had some things to do earlier and so came in now. Denies source of bleeding. Patient has headaches that are unchanged from chronically. Patient has dark stools though states is unchanged from his iron supplementation.  Past Medical History  Diagnosis Date  . COPD (chronic obstructive pulmonary disease)     Uses occasional nighttime O2  . Disseminated herpes zoster 2010  . GERD (gastroesophageal reflux disease)   . Asthma   . Pulmonary embolus     2003 and 2011  . Pulmonary nodules     Chest CT 09/11  . Mixed hyperlipidemia   . Chronic atrial fibrillation   . Lupus anticoagulant positive   . Essential hypertension, benign   . Cholelithiasis   . Rheumatoid arthritis(714.0)   . History of chicken pox 1941; 2011  . Anemia   . Chronic lower back pain   . Chronic diastolic heart failure   . Cirrhosis     Questionable, AFP normal Feb 01/2012, hx of ETOH use   . Headache(784.0)   . Cervical spondylosis without myelopathy   . Stage III chronic kidney disease   . Cellulitis of leg,  left 01/03/2015  . Aortic stenosis, moderate 05/21/2015   Past Surgical History  Procedure Laterality Date  . Polypectomy  02/02/2012    RMR: Multiple colonic polyps removed, flat, tubular adenomas/ Left-sided diverticulosis  . Posterior lumbar vertebrae excision  2003; 2009; 2011  . Myringotomy      "3 times; both ears"  . Cataract extraction w/ intraocular lens  implant, bilateral    . Cholecystectomy  05/23/2012    Procedure: LAPAROSCOPIC CHOLECYSTECTOMY WITH INTRAOPERATIVE CHOLANGIOGRAM;  Surgeon: Stark Klein, MD;  Location: [facility];  Service: General;  Laterality: N/A;  . Esophagogastroduodenoscopy  02/02/12    PPJ:KDTOIZTI size hiatal hernia; otherwise normal exam  . Colonoscopy  01/24/2013    Procedure: COLONOSCOPY;  Surgeon: Daneil Dolin, MD;  [facility];  Service: Endoscopy;  Laterality: N/A;  8:30  . Back surgery     Family History  Problem Relation Age of Onset  . Colon cancer Neg Hx   . Liver disease Neg Hx    Social History  Substance Use Topics  . Smoking status: Former Smoker -- 1.00 packs/day for 57 years    Types: Cigarettes    Quit date: 01/27/1998  . Smokeless tobacco: Never Used  . Alcohol Use: No     Comment: "quit drinking 1976"    Review of Systems  Constitutional: Negative for fever and chills.  HENT: Negative for congestion and facial swelling.  Eyes: Negative for discharge and visual disturbance.  Respiratory: Negative for shortness of breath.   Cardiovascular: Negative for chest pain and palpitations.  Gastrointestinal: Negative for nausea, vomiting, abdominal pain and diarrhea.       Dark stools from iron supplementation and unchanged   Musculoskeletal: Negative for myalgias and arthralgias.  Skin: Negative for color change and rash.  Neurological: Positive for headaches (chronic and unchanged). Negative for tremors and syncope.  Psychiatric/Behavioral: Negative for confusion and dysphoric mood.      Allergies  Cymbalta; Procaine  hcl; and Bystolic  Home Medications   Prior to Admission medications   Medication Sig Start Date End Date Taking? Authorizing Provider  albuterol (PROAIR HFA) 108 (90 BASE) MCG/ACT inhaler Inhale 2 puffs into the lungs every 6 (six) hours as needed. Shortness of Breath    Historical Provider, MD  albuterol (PROVENTIL) (2.5 MG/3ML) 0.083% nebulizer solution Take 2.5 mg by nebulization every 6 (six) hours as needed. Shortness of Breath    Historical Provider, MD  benzonatate (TESSALON) 100 MG capsule Take 1 capsule (100 mg total) by mouth 3 (three) times daily as needed for cough. 05/25/15   Rexene Alberts, MD  budesonide-formoterol [facility]) 160-4.5 MCG/ACT inhaler Wash mouth out afterwards 06/01/15   Historical Provider, MD  cetirizine (ZYRTEC) 10 MG tablet Take 10 mg by mouth daily.      Historical Provider, MD  digoxin (LANOXIN) 0.125 MG tablet Take this medication EVERY OTHER DAY starting Tuesday 05/26/15. 05/25/15   Rexene Alberts, MD  diltiazem (CARDIZEM CD) 360 MG 24 hr capsule Take 1 capsule (360 mg total) by mouth daily. 05/25/15   Rexene Alberts, MD  Etanercept (ENBREL) 25 MG/0.5ML SOSY Inject into the skin 2 (two) times a week.     Historical Provider, MD  ferrous sulfate 325 (65 FE) MG tablet Take 325 mg by mouth daily.    Historical Provider, MD  folic acid (FOLVITE) 1 MG tablet Take 1 mg by mouth daily.     Historical Provider, MD  furosemide (LASIX) 40 MG tablet Take 0.5 tablets (20 mg total) by mouth daily. Take daily for prevention of fluid buildup. 05/25/15   Rexene Alberts, MD  gabapentin (NEURONTIN) 300 MG capsule TAKE ONE CAPSULE BY MOUTH EVERY MORNING AND TWO CAPSULES IN THE EVENING Patient taking differently: TAKE TWO CAPSULES BY MOUTH TWICE DAILY 02/03/15   Kathrynn Ducking, MD  ketoconazole (NIZORAL) 2 % shampoo Apply 1 application topically 3 (three) times a week.  05/21/13   Historical Provider, MD  methotrexate (RHEUMATREX) 2.5 MG tablet Take 7.5 mg by mouth 2 (two) times a week.  *TAKE 3 TABLETS BY MOUTH TWICE WEEKLY ON FRIDAYS AND SATURDAYS*    Historical Provider, MD  mometasone (NASONEX) 50 MCG/ACT nasal spray Place 2 sprays into the nose daily.    Historical Provider, MD  Omega-3 Fatty Acids (FISH OIL) 1000 MG CAPS Take 2 capsules by mouth 2 (two) times daily.     Historical Provider, MD  omeprazole (PRILOSEC) 40 MG capsule Take 40 mg by mouth daily.    Historical Provider, MD  oxyCODONE-acetaminophen (PERCOCET) 10-325 MG per tablet Take 1 tablet by mouth every 6 (six) hours as needed for pain. 05/25/12   Verlee Monte, MD  potassium chloride SA (K-DUR,KLOR-CON) 20 MEQ tablet Take 0.5 tablets (10 mEq total) by mouth daily. 05/25/15   Rexene Alberts, MD  tadalafil (CIALIS) 5 MG tablet Take 5 mg by mouth daily. 05/18/15   Historical Provider, MD  tamsulosin (FLOMAX) 0.4 MG  CAPS capsule Take 1 capsule (0.4 mg total) by mouth at bedtime. For prostate treatment. 05/25/15   Rexene Alberts, MD  Vitamin D, Ergocalciferol, (DRISDOL) 50000 UNITS CAPS capsule Take 1 capsule by mouth once a week. 04/27/15   Historical Provider, MD  warfarin (COUMADIN) 2 MG tablet Take 5 mg by mouth See admin instructions. No medication on Wednesday. 5 on al other days    Historical Provider, MD   BP 141/60 mmHg  Pulse 94  Temp(Src) 98.2 F (36.8 C) (Oral)  Resp 18  Ht 5\' 8"  (1.727 m)  Wt 198 lb (89.812 kg)  BMI 30.11 kg/m2  SpO2 95% Physical Exam  Constitutional: He is oriented to person, place, and time. He appears well-developed and well-nourished.  HENT:  Head: Normocephalic and atraumatic.  Eyes: EOM are normal. Pupils are equal, round, and reactive to light.  Neck: Normal range of motion. Neck supple. No JVD present.  Cardiovascular: Normal rate and regular rhythm.  Exam reveals no gallop and no friction rub.   No murmur heard. Pulmonary/Chest: No respiratory distress. He has no wheezes.  Abdominal: He exhibits no distension. There is no tenderness. There is no rebound and no guarding.   Musculoskeletal: Normal range of motion.  Neurological: He is alert and oriented to person, place, and time.  Skin: No rash noted. No pallor.  Psychiatric: He has a normal mood and affect. His behavior is normal.    ED Course  Procedures (including critical care time) Labs Review Labs Reviewed - No data to display  Imaging Review No results found. I have personally reviewed and evaluated these images and lab results as part of my medical decision-making.   EKG Interpretation None      MDM   Final diagnoses:  Elevated INR (international normalized ratio)    81 year old male with a chief complaint of an elevated INR. No signs of acute bleeding. Discussed the current chest guidelines do not recommend vitamin K administration for INR less than 10. Patient was sent here by his clinic to have vitamin K provided. We'll give him a 2.5 mg oral dose. Have him call his doctor in the morning.  8:12 PM:  I have discussed the diagnosis/risks/treatment options with the patient and family and believe the pt to be eligible for discharge home to follow-up with PCP. We also discussed returning to the ED immediately if new or worsening sx occur. We discussed the sx which are most concerning (e.g., bleeding, falls) that necessitate immediate return. Medications administered to the patient during their visit and any new prescriptions provided to the patient are listed below.  Medications given during this visit Medications  phytonadione (VITAMIN K) tablet 2.5 mg (not administered)    New Prescriptions   No medications on file     The patient appears reasonably screen and/or stabilized for discharge and I doubt any other medical condition or other [facility] requiring further screening, evaluation, or treatment in the ED at this time prior to discharge.      Deno Etienne, DO 08/20/15 2012

## 2015-08-20 NOTE — Discharge Instructions (Signed)
Warfarin Coagulopathy Warfarin (Coumadin) coagulopathy refers to bleeding that may occur as a complication of the medicine warfarin. Warfarin is an oral blood thinner (anticoagulant). Warfarin is used for medical conditions where thinning of the blood is needed to prevent blood clots.  CAUSES Bleeding is the most common and most serious complication of warfarin. The amount of bleeding is related to the warfarin dose and length of treatment. In addition, bleeding complications can also occur due to:  Intentional or accidental warfarin overdose.  Underlying medical conditions.  Dietary changes.  Medicine, herbal, supplement, or alcohol interactions. SYMPTOMS Severe bleeding while on warfarin may occur from any tissue or organ. Symptoms of the blood being too thin may include:  Bleeding from the nose or gums.  Blood in bowel movements which may appear as bright red, dark, or black tarry stools.  Blood in the urine which may appear as pink, red, or brown urine.  Unusual bruising or bruising easily.  A cut that does not stop bleeding within 10 minutes.  Vomiting blood or continuous nausea for more than 1 day.  Coughing up blood.  Broken blood vessels in your eye (subconjunctival hemorrhage).  Abdominal or back pain with or without flank bruising.  Sudden, severe headache.  Sudden weakness or numbness of the face, arm, or leg, especially on one side of the body.  Sudden confusion.  Trouble speaking (aphasia) or understanding.  Sudden trouble seeing in one or both eyes.  Sudden trouble walking.  Dizziness.  Loss of balance or coordination.  Vaginal bleeding.  Swelling or pain at an injection site.  Superficial fat tissue death (necrosis) which may cause skin scarring. This is more common in women and may first present as pain in the waist, thighs, or buttocks.  Fever. HOME CARE INSTRUCTIONS  Always contact your health care provider of any concerns or signs of  possible warfarin coagulopathy as soon as possible.  Take warfarin exactly as directed by your health care provider. It is recommended that you take your warfarin dose at the same time of the day. If you have been told to stop taking warfarin, do not resume taking warfarin until directed to do so by your health care provider. Follow your health care provider's instructions if you accidentally take an extra dose or miss a dose of warfarin. It is very important to take warfarin as directed since bleeding or blood clots could result in chronic or permanent injury, pain, or disability.  Keep all follow-up appointments with your health care provider as directed. It is very important to keep your appointments. Not keeping appointments could result in a chronic or permanent injury, pain, or disability because warfarin is a medicine that requires close monitoring.  While taking warfarin, you will need to have regular blood tests to measure your blood clotting time. These blood tests usually include both the prothrombin time (PT) and International Normalized Ratio (INR) tests. The PT and INR results allow your health care provider to adjust your dose of warfarin. The dose can change for many reasons. It is critically important that you have your PT and INR levels drawn exactly as directed. Your warfarin dose may stay the same or change depending on what the PT and INR results are. Be sure to follow up with your health care provider regarding your PT and INR test results and what your warfarin dosage should be.  Many medicines can interfere with warfarin and affect the PT and INR results. You must tell your health care provider about   any and all medicines you take. This includes all vitamins and supplements. Ask your health care provider before taking these. Prescription and over-the-counter medicine consistency is critical to warfarin management. It is important that potential interactions are checked before you  start a new medicine. Be especially cautious with aspirin and anti-inflammatory medicines. Ask your health care provider before taking these. Medicines such as antibiotics and acid-reducing medicine can interact with warfarin and can cause an increased warfarin effect. Warfarin can also interfere with the effectiveness of medicines you are taking. Do not take or discontinue any prescribed or over-the-counter medicine except on the advice of your health care provider or pharmacist.  Some vitamins, supplements, and herbal products interfere with the effectiveness of warfarin. Vitamin E may increase the anticoagulant effects of warfarin. Vitamin K can cause warfarin to be less effective. Do not take or discontinue any vitamin, supplement, or herbal product except on the advice of your health care provider or pharmacist.  Eat what you normally eat and keep the vitamin K content of your diet consistent. Avoid major changes in your diet, or notify your health care provider before changing your diet. Suddenly getting a lot more vitamin K could cause your blood to clot too quickly. A sudden decrease in vitamin K intake could cause your blood to clot too slowly. These changes in vitamin K intake could lead to dangerous blood clotsor to bleeding. To keep your vitamin K intake consistent, you must be aware of which foods contain moderate or high amounts of vitamin K. Some foods high in vitamin K include spinach, kale, broccoli, cabbage, greens, Brussels sprouts, asparagus, bok choy, coleslaw, parsley, and green tea. Arrange a visit with a dietitian to answer your questions.  If you have a loss of appetite or get the stomach flu (viral gastroenteritis), talk to your health care provider as soon as possible. A decrease in your normal vitamin K intake can make you more sensitive to your usual dose of warfarin.  Some medical conditions may increase your risk for bleeding while you are taking warfarin. A fever, diarrhea  lasting more than a day, worsening heart failure, or worsening liver function are some medical conditions that could affect warfarin. Contact your health care provider if you have any of these medical conditions.  Be careful not to cut yourself when using sharp objects or while shaving.  Alcohol can change the body's ability to handle warfarin. It is best to avoid alcoholic drinks or consume only very small amounts while taking warfarin. Notify your health care provider if you change your alcohol intake. A sudden increase in alcohol use can increase your risk of bleeding. Chronic alcohol use can cause warfarin to be less effective.  Limit physical activities or sports that could result in a fall or cause injury.  Do not use warfarin if you are pregnant.  Inform all your health care providers and your dentist that you take warfarin.  Inform all health care providers if you are taking warfarin and aspirin or platelet inhibitor medicines such as clopidogrel, ticagrelor, or prasugrel. Use of these medicines in addition to warfarin can increase your risk of bleeding or death. Taking these medicines together should only be done under the direct care of your health care providers. SEEK IMMEDIATE MEDICAL CARE IF:  You cough up blood.  You have dark or black stools or there is bright red blood coming from your rectum.  You vomit blood or have nausea for more than 1 day.  You have   blood in the urine or pink-colored urine.  You have unusual bruising or have increased bruising.  You have bleeding from the nose or gums that does not stop quickly.  You have a cut that does not stop bleeding within 2-3 minutes.  You have sudden weakness or numbness of the face, arm, or leg, especially on one side of the body.  You have sudden confusion.  You have trouble speaking (aphasia) or understanding.  You have sudden trouble seeing in one or both eyes.  You have sudden trouble walking.  You have  dizziness.  You have a loss of balance or coordination.  You have a sudden, severe headache.  You have a serious fall or head injury, even if you are not bleeding.  You have swelling or pain at an injection site.  You have unexplained tenderness or pain in the abdomen, back, waist, thighs, or buttocks.  You have a fever. Any of these symptoms may represent a serious problem that is an emergency. Do not wait to see if the symptoms will go away. Get medical help right away. Call your local emergency services (911 in [place] Do not drive yourself to the hospital. Document Released: 11/20/2006 Document Revised: 04/28/2014 Document Reviewed: 05/22/2012 [employer] Patient Information 2015 [employer]. This information is not intended to replace advice given to you by your health care provider. Make sure you discuss any questions you have with your health care provider.  

## 2015-08-22 ENCOUNTER — Emergency Department (HOSPITAL_COMMUNITY): Payer: Medicare Other

## 2015-08-22 ENCOUNTER — Encounter (HOSPITAL_COMMUNITY): Payer: Self-pay | Admitting: Emergency Medicine

## 2015-08-22 ENCOUNTER — Inpatient Hospital Stay (HOSPITAL_COMMUNITY)
Admission: EM | Admit: 2015-08-22 | Discharge: 2015-08-25 | DRG: 186 | Disposition: A | Discharge status: Home / Self Care | Payer: Medicare Other | Attending: Internal Medicine | Admitting: Internal Medicine

## 2015-08-22 DIAGNOSIS — E782 Mixed hyperlipidemia: Secondary | ICD-10-CM | POA: Diagnosis present

## 2015-08-22 DIAGNOSIS — J441 Chronic obstructive pulmonary disease with (acute) exacerbation: Secondary | ICD-10-CM | POA: Diagnosis present

## 2015-08-22 DIAGNOSIS — N183 Chronic kidney disease, stage 3 unspecified: Secondary | ICD-10-CM | POA: Diagnosis present

## 2015-08-22 DIAGNOSIS — I5032 Chronic diastolic (congestive) heart failure: Secondary | ICD-10-CM | POA: Diagnosis present

## 2015-08-22 DIAGNOSIS — J9601 Acute respiratory failure with hypoxia: Secondary | ICD-10-CM | POA: Diagnosis present

## 2015-08-22 DIAGNOSIS — I482 Chronic atrial fibrillation, unspecified: Secondary | ICD-10-CM | POA: Diagnosis present

## 2015-08-22 DIAGNOSIS — G629 Polyneuropathy, unspecified: Secondary | ICD-10-CM | POA: Diagnosis present

## 2015-08-22 DIAGNOSIS — N400 Enlarged prostate without lower urinary tract symptoms: Secondary | ICD-10-CM | POA: Diagnosis present

## 2015-08-22 DIAGNOSIS — Z87891 Personal history of nicotine dependence: Secondary | ICD-10-CM | POA: Diagnosis not present

## 2015-08-22 DIAGNOSIS — Z86718 Personal history of other venous thrombosis and embolism: Secondary | ICD-10-CM

## 2015-08-22 DIAGNOSIS — K7460 Unspecified cirrhosis of liver: Secondary | ICD-10-CM | POA: Diagnosis present

## 2015-08-22 DIAGNOSIS — M069 Rheumatoid arthritis, unspecified: Secondary | ICD-10-CM | POA: Diagnosis present

## 2015-08-22 DIAGNOSIS — Z7901 Long term (current) use of anticoagulants: Secondary | ICD-10-CM | POA: Diagnosis not present

## 2015-08-22 DIAGNOSIS — I4891 Unspecified atrial fibrillation: Secondary | ICD-10-CM

## 2015-08-22 DIAGNOSIS — R0602 Shortness of breath: Secondary | ICD-10-CM | POA: Diagnosis present

## 2015-08-22 DIAGNOSIS — R918 Other nonspecific abnormal finding of lung field: Secondary | ICD-10-CM | POA: Diagnosis present

## 2015-08-22 DIAGNOSIS — D696 Thrombocytopenia, unspecified: Secondary | ICD-10-CM | POA: Diagnosis present

## 2015-08-22 DIAGNOSIS — K219 Gastro-esophageal reflux disease without esophagitis: Secondary | ICD-10-CM | POA: Diagnosis present

## 2015-08-22 DIAGNOSIS — J449 Chronic obstructive pulmonary disease, unspecified: Secondary | ICD-10-CM

## 2015-08-22 DIAGNOSIS — Z86711 Personal history of pulmonary embolism: Secondary | ICD-10-CM

## 2015-08-22 DIAGNOSIS — R0603 Acute respiratory distress: Secondary | ICD-10-CM | POA: Diagnosis present

## 2015-08-22 DIAGNOSIS — C8338 Diffuse large B-cell lymphoma, lymph nodes of multiple sites: Secondary | ICD-10-CM | POA: Diagnosis present

## 2015-08-22 DIAGNOSIS — Z9889 Other specified postprocedural states: Secondary | ICD-10-CM

## 2015-08-22 DIAGNOSIS — I129 Hypertensive chronic kidney disease with stage 1 through stage 4 chronic kidney disease, or unspecified chronic kidney disease: Secondary | ICD-10-CM | POA: Diagnosis present

## 2015-08-22 DIAGNOSIS — C8330 Diffuse large B-cell lymphoma, unspecified site: Secondary | ICD-10-CM

## 2015-08-22 DIAGNOSIS — J90 Pleural effusion, not elsewhere classified: Principal | ICD-10-CM | POA: Diagnosis present

## 2015-08-22 DIAGNOSIS — J80 Acute respiratory distress syndrome: Secondary | ICD-10-CM | POA: Diagnosis not present

## 2015-08-22 HISTORY — DX: Diffuse large B-cell lymphoma, unspecified site: C83.30

## 2015-08-22 LAB — COMPREHENSIVE METABOLIC PANEL
ALK PHOS: 55 U/L (ref 38–126)
ALT: 33 U/L (ref 17–63)
ANION GAP: 9 (ref 5–15)
AST: 48 U/L — ABNORMAL HIGH (ref 15–41)
Albumin: 3.5 g/dL (ref 3.5–5.0)
BUN: 11 mg/dL (ref 6–20)
CALCIUM: 9.1 mg/dL (ref 8.9–10.3)
CO2: 25 mmol/L (ref 22–32)
CREATININE: 1.03 mg/dL (ref 0.61–1.24)
Chloride: 103 mmol/L (ref 101–111)
GFR calc Af Amer: >60 mL/min (ref >60)
GFR calc non Af Amer: >60 mL/min (ref >60)
GLUCOSE: 82 mg/dL (ref 65–99)
Potassium: 4.4 mmol/L (ref 3.5–5.1)
Sodium: 137 mmol/L (ref 135–145)
Total Bilirubin: 2.4 mg/dL — ABNORMAL HIGH (ref 0.3–1.2)
Total Protein: 7.4 g/dL (ref 6.5–8.1)

## 2015-08-22 LAB — TROPONIN I: Troponin I: <0.03 ng/mL (ref <0.031)

## 2015-08-22 LAB — BRAIN NATRIURETIC PEPTIDE: B Natriuretic Peptide: 358 pg/mL — ABNORMAL HIGH (ref 0.0–100.0)

## 2015-08-22 LAB — CBC WITH DIFFERENTIAL/PLATELET
Basophils Absolute: 0.1 10*3/uL (ref 0.0–0.1)
Basophils Relative: 1 % (ref 0–1)
Eosinophils Absolute: 0.5 10*3/uL (ref 0.0–0.7)
Eosinophils Relative: 7 % — ABNORMAL HIGH (ref 0–5)
HEMATOCRIT: 40.7 % (ref 39.0–52.0)
HEMOGLOBIN: 13.8 g/dL (ref 13.0–17.0)
LYMPHS ABS: 0.5 10*3/uL — AB (ref 0.7–4.0)
Lymphocytes Relative: 8 % — ABNORMAL LOW (ref 12–46)
MCH: 34.3 pg — AB (ref 26.0–34.0)
MCHC: 33.9 g/dL (ref 30.0–36.0)
MCV: 101.2 fL — ABNORMAL HIGH (ref 78.0–100.0)
MONOS PCT: 17 % — AB (ref 3–12)
Monocytes Absolute: 1.1 10*3/uL — ABNORMAL HIGH (ref 0.1–1.0)
NEUTROS ABS: 4.5 10*3/uL (ref 1.7–7.7)
NEUTROS PCT: 68 % (ref 43–77)
Platelets: 98 10*3/uL — ABNORMAL LOW (ref 150–400)
RBC: 4.02 MIL/uL — ABNORMAL LOW (ref 4.22–5.81)
RDW: 17.8 % — ABNORMAL HIGH (ref 11.5–15.5)
WBC: 6.6 10*3/uL (ref 4.0–10.5)

## 2015-08-22 LAB — PROTIME-INR
INR: 1.54 — ABNORMAL HIGH (ref 0.00–1.49)
Prothrombin Time: 18.5 seconds — ABNORMAL HIGH (ref 11.6–15.2)

## 2015-08-22 LAB — DIGOXIN LEVEL: Digoxin Level: 0.9 ng/mL (ref 0.8–2.0)

## 2015-08-22 MED ORDER — OXYCODONE HCL 5 MG PO TABS
5.0000 mg | ORAL_TABLET | ORAL | Status: DC | PRN
  Administered 2015-08-22 – 2015-08-25 (×9): 10 mg via ORAL
  Filled 2015-08-22 (×10): qty 2

## 2015-08-22 MED ORDER — SODIUM CHLORIDE 0.9 % IV SOLN: 250.0000 mL | INTRAVENOUS | Status: DC | PRN

## 2015-08-22 MED ORDER — GABAPENTIN 300 MG PO CAPS
600.0000 mg | ORAL_CAPSULE | Freq: Two times a day (BID) | ORAL | Status: DC
  Administered 2015-08-22 – 2015-08-25 (×6): 600 mg via ORAL
  Filled 2015-08-22 (×6): qty 2

## 2015-08-22 MED ORDER — DOXYCYCLINE HYCLATE 100 MG PO TABS
100.0000 mg | ORAL_TABLET | Freq: Two times a day (BID) | ORAL | Status: DC
  Administered 2015-08-22 – 2015-08-25 (×6): 100 mg via ORAL
  Filled 2015-08-22 (×6): qty 1

## 2015-08-22 MED ORDER — ONDANSETRON HCL 4 MG/2ML IJ SOLN: 4.0000 mg | Freq: Four times a day (QID) | INTRAMUSCULAR | Status: DC | PRN

## 2015-08-22 MED ORDER — POTASSIUM CHLORIDE CRYS ER 10 MEQ PO TBCR
10.0000 meq | EXTENDED_RELEASE_TABLET | Freq: Every day | ORAL | Status: DC
  Administered 2015-08-23 – 2015-08-25 (×3): 10 meq via ORAL
  Filled 2015-08-22 (×3): qty 1

## 2015-08-22 MED ORDER — SODIUM CHLORIDE 0.9 % IJ SOLN: 3.0000 mL | INTRAMUSCULAR | Status: DC | PRN

## 2015-08-22 MED ORDER — PANTOPRAZOLE SODIUM 40 MG PO TBEC
40.0000 mg | DELAYED_RELEASE_TABLET | Freq: Every day | ORAL | Status: DC
Start: 2015-08-23 — End: 2015-08-25
  Administered 2015-08-23 – 2015-08-25 (×3): 40 mg via ORAL
  Filled 2015-08-22 (×3): qty 1

## 2015-08-22 MED ORDER — METHOTREXATE 2.5 MG PO TABS: 7.5000 mg | ORAL_TABLET | ORAL | Status: DC

## 2015-08-22 MED ORDER — TAMSULOSIN HCL 0.4 MG PO CAPS
0.4000 mg | ORAL_CAPSULE | Freq: Every day | ORAL | Status: DC
  Administered 2015-08-23 – 2015-08-24 (×2): 0.4 mg via ORAL
  Filled 2015-08-22 (×3): qty 1

## 2015-08-22 MED ORDER — SODIUM CHLORIDE 0.9 % IJ SOLN
3.0000 mL | Freq: Two times a day (BID) | INTRAMUSCULAR | Status: DC
  Administered 2015-08-22 – 2015-08-25 (×5): 3 mL via INTRAVENOUS

## 2015-08-22 MED ORDER — OMEGA-3-ACID ETHYL ESTERS 1 G PO CAPS
1.0000 g | ORAL_CAPSULE | Freq: Every day | ORAL | Status: DC
  Administered 2015-08-23 – 2015-08-25 (×3): 1 g via ORAL
  Filled 2015-08-22 (×3): qty 1

## 2015-08-22 MED ORDER — METHYLPREDNISOLONE SODIUM SUCC 125 MG IJ SOLR
60.0000 mg | Freq: Four times a day (QID) | INTRAMUSCULAR | Status: DC
  Administered 2015-08-22 – 2015-08-23 (×3): 60 mg via INTRAVENOUS
  Filled 2015-08-22 (×3): qty 2

## 2015-08-22 MED ORDER — ONDANSETRON HCL 4 MG PO TABS: 4.0000 mg | ORAL_TABLET | Freq: Four times a day (QID) | ORAL | Status: DC | PRN

## 2015-08-22 MED ORDER — FERROUS SULFATE 325 (65 FE) MG PO TABS
325.0000 mg | ORAL_TABLET | Freq: Every day | ORAL | Status: DC
  Administered 2015-08-22 – 2015-08-25 (×4): 325 mg via ORAL
  Filled 2015-08-22 (×4): qty 1

## 2015-08-22 MED ORDER — TAMSULOSIN HCL 0.4 MG PO CAPS
0.4000 mg | ORAL_CAPSULE | Freq: Once | ORAL | Status: AC
Start: 2015-08-22 — End: 2015-08-22
  Administered 2015-08-22: 0.4 mg via ORAL

## 2015-08-22 MED ORDER — IPRATROPIUM-ALBUTEROL 0.5-2.5 (3) MG/3ML IN SOLN
3.0000 mL | RESPIRATORY_TRACT | Status: DC
  Administered 2015-08-23 – 2015-08-25 (×12): 3 mL via RESPIRATORY_TRACT
  Filled 2015-08-22 (×12): qty 3

## 2015-08-22 MED ORDER — DILTIAZEM HCL 30 MG PO TABS
60.0000 mg | ORAL_TABLET | Freq: Once | ORAL | Status: AC
  Administered 2015-08-22: 60 mg via ORAL
  Filled 2015-08-22: qty 2

## 2015-08-22 MED ORDER — FUROSEMIDE 10 MG/ML IJ SOLN
20.0000 mg | Freq: Two times a day (BID) | INTRAMUSCULAR | Status: DC
  Administered 2015-08-22 – 2015-08-23 (×2): 20 mg via INTRAVENOUS
  Filled 2015-08-22 (×2): qty 2

## 2015-08-22 MED ORDER — DILTIAZEM HCL 25 MG/5ML IV SOLN: 10.0000 mg | Freq: Once | INTRAVENOUS | Status: DC | PRN

## 2015-08-22 MED ORDER — IOHEXOL 350 MG/ML SOLN
100.0000 mL | Freq: Once | INTRAVENOUS | Status: AC | PRN
  Administered 2015-08-22: 100 mL via INTRAVENOUS

## 2015-08-22 MED ORDER — DILTIAZEM HCL ER COATED BEADS 180 MG PO CP24
360.0000 mg | ORAL_CAPSULE | Freq: Once | ORAL | Status: AC
Start: 2015-08-22 — End: 2015-08-22
  Administered 2015-08-22: 360 mg via ORAL

## 2015-08-22 MED ORDER — HEPARIN SODIUM (PORCINE) 5000 UNIT/ML IJ SOLN
5000.0000 [IU] | Freq: Three times a day (TID) | INTRAMUSCULAR | Status: DC
  Administered 2015-08-22 – 2015-08-25 (×7): 5000 [IU] via SUBCUTANEOUS
  Filled 2015-08-22 (×7): qty 1

## 2015-08-22 MED ORDER — LEVALBUTEROL HCL 1.25 MG/0.5ML IN NEBU
1.2500 mg | INHALATION_SOLUTION | Freq: Once | RESPIRATORY_TRACT | Status: AC
  Administered 2015-08-22: 1.25 mg via RESPIRATORY_TRACT
  Filled 2015-08-22: qty 0.5

## 2015-08-22 MED ORDER — DIGOXIN 125 MCG PO TABS
0.1250 mg | ORAL_TABLET | Freq: Every day | ORAL | Status: DC
  Administered 2015-08-23 – 2015-08-25 (×3): 0.125 mg via ORAL
  Filled 2015-08-22 (×3): qty 1

## 2015-08-22 MED ORDER — ETANERCEPT 25 MG/0.5ML ~~LOC~~ SOSY
25.0000 mg | PREFILLED_SYRINGE | SUBCUTANEOUS | Status: DC
  Administered 2015-08-24: 25 mg via SUBCUTANEOUS
  Filled 2015-08-22: qty 1

## 2015-08-22 MED ORDER — FOLIC ACID 1 MG PO TABS
1.0000 mg | ORAL_TABLET | Freq: Every day | ORAL | Status: DC
Start: 2015-08-23 — End: 2015-08-25
  Administered 2015-08-23 – 2015-08-25 (×3): 1 mg via ORAL
  Filled 2015-08-22 (×3): qty 1

## 2015-08-22 MED ORDER — SODIUM CHLORIDE 0.9 % IJ SOLN
3.0000 mL | Freq: Two times a day (BID) | INTRAMUSCULAR | Status: DC
  Administered 2015-08-22 – 2015-08-24 (×3): 3 mL via INTRAVENOUS

## 2015-08-22 MED ORDER — DILTIAZEM HCL ER COATED BEADS 180 MG PO CP24
360.0000 mg | ORAL_CAPSULE | Freq: Every day | ORAL | Status: DC
  Filled 2015-08-22 (×2): qty 2

## 2015-08-22 MED ORDER — ACETAMINOPHEN 325 MG PO TABS: 650.0000 mg | ORAL_TABLET | Freq: Four times a day (QID) | ORAL | Status: DC | PRN

## 2015-08-22 MED ORDER — ACETAMINOPHEN 650 MG RE SUPP: 650.0000 mg | Freq: Four times a day (QID) | RECTAL | Status: DC | PRN

## 2015-08-22 MED ORDER — IPRATROPIUM-ALBUTEROL 0.5-2.5 (3) MG/3ML IN SOLN
3.0000 mL | RESPIRATORY_TRACT | Status: DC
  Administered 2015-08-22 (×2): 3 mL via RESPIRATORY_TRACT
  Filled 2015-08-22 (×2): qty 3

## 2015-08-22 NOTE — ED Provider Notes (Signed)
CSN: [id number]     Arrival date & time 08/22/15  1337 History   First MD Initiated Contact with Patient 08/22/15 1348     Chief Complaint  Patient presents with  . Shortness of Breath     (Consider location/radiation/quality/duration/timing/severity/associated sxs/prior Treatment) HPI Comments: Patient presents to the emergency room for evaluation of difficulty breathing. Patient reports that he became short of breath last night around 10 PM and it has continued. He feels like he cannot catch his breath. He is not experiencing any chest pain associated with the symptoms. He has had a slight cough, occasionally productive. There has not been any fever.  Patient is a 81 y.o. male presenting with shortness of breath.  Shortness of Breath Associated symptoms: cough   Associated symptoms: no chest pain     Past Medical History  Diagnosis Date  . COPD (chronic obstructive pulmonary disease)     Uses occasional nighttime O2  . Disseminated herpes zoster 2010  . GERD (gastroesophageal reflux disease)   . Asthma   . Pulmonary embolus     2003 and 2011  . Pulmonary nodules     Chest CT 09/11  . Mixed hyperlipidemia   . Chronic atrial fibrillation   . Lupus anticoagulant positive   . Essential hypertension, benign   . Cholelithiasis   . Rheumatoid arthritis(714.0)   . History of chicken pox 1941; 2011  . Anemia   . Chronic lower back pain   . Chronic diastolic heart failure   . Cirrhosis     Questionable, AFP normal Feb 01/2012, hx of ETOH use   . Headache(784.0)   . Cervical spondylosis without myelopathy   . Stage III chronic kidney disease   . Cellulitis of leg, left 01/03/2015  . Aortic stenosis, moderate 05/21/2015   Past Surgical History  Procedure Laterality Date  . Polypectomy  02/02/2012    RMR: Multiple colonic polyps removed, flat, tubular adenomas/ Left-sided diverticulosis  . Posterior lumbar vertebrae excision  2003; 2009; 2011  . Myringotomy      "3 times; both  ears"  . Cataract extraction w/ intraocular lens  implant, bilateral    . Cholecystectomy  05/23/2012    Procedure: LAPAROSCOPIC CHOLECYSTECTOMY WITH INTRAOPERATIVE CHOLANGIOGRAM;  Surgeon: Stark Klein, MD;  Location: [facility];  Service: General;  Laterality: N/A;  . Esophagogastroduodenoscopy  02/02/12    TMA:[id number] size hiatal hernia; otherwise normal exam  . Colonoscopy  01/24/2013    Procedure: COLONOSCOPY;  Surgeon: Daneil Dolin, MD;  Location: [facility];  Service: Endoscopy;  Laterality: N/A;  8:30  . Back surgery     Family History  Problem Relation Age of Onset  . Colon cancer Neg Hx   . Liver disease Neg Hx    Social History  Substance Use Topics  . Smoking status: Former Smoker -- 1.00 packs/day for 57 years    Types: Cigarettes    Quit date: 01/27/1998  . Smokeless tobacco: Never Used  . Alcohol Use: No     Comment: "quit drinking 1976"    Review of Systems  Respiratory: Positive for cough and shortness of breath.   Cardiovascular: Negative for chest pain.  All other systems reviewed and are negative.     Allergies  Cymbalta; Procaine hcl; and Bystolic  Home Medications   Prior to Admission medications   Medication Sig Start Date End Date Taking? Authorizing Provider  albuterol (PROAIR HFA) 108 (90 BASE) MCG/ACT inhaler Inhale 2 puffs into the lungs every 6 (  six) hours as needed. Shortness of Breath   Yes Historical Provider, MD  albuterol (PROVENTIL) (2.5 MG/3ML) 0.083% nebulizer solution Take 2.5 mg by nebulization every 6 (six) hours as needed. Shortness of Breath   Yes Historical Provider, MD  cetirizine (ZYRTEC) 10 MG tablet Take 10 mg by mouth daily.     Yes Historical Provider, MD  digoxin (LANOXIN) 0.125 MG tablet Take this medication EVERY OTHER DAY starting Tuesday 05/26/15. Patient taking differently: Take 0.125 mg by mouth daily. . 05/25/15  Yes Rexene Alberts, MD  diltiazem (CARDIZEM CD) 360 MG 24 hr capsule Take 1 capsule (360 mg total) by mouth  daily. 05/25/15  Yes Rexene Alberts, MD  Etanercept (ENBREL) 25 MG/0.5ML SOSY Inject into the skin 2 (two) times a week.    Yes Historical Provider, MD  ferrous sulfate 325 (65 FE) MG tablet Take 325 mg by mouth daily.   Yes Historical Provider, MD  folic acid (FOLVITE) 1 MG tablet Take 1 mg by mouth daily.    Yes Historical Provider, MD  furosemide (LASIX) 40 MG tablet Take 0.5 tablets (20 mg total) by mouth daily. Take daily for prevention of fluid buildup. 05/25/15  Yes Rexene Alberts, MD  gabapentin (NEURONTIN) 300 MG capsule TAKE ONE CAPSULE BY MOUTH EVERY MORNING AND TWO CAPSULES IN THE EVENING Patient taking differently: TAKE TWO CAPSULES BY MOUTH TWICE DAILY 02/03/15  Yes Kathrynn Ducking, MD  ketoconazole (NIZORAL) 2 % shampoo Apply 1 application topically 3 (three) times a week.  05/21/13  Yes Historical Provider, MD  methotrexate (RHEUMATREX) 2.5 MG tablet Take 7.5 mg by mouth 2 (two) times a week. *TAKE 3 TABLETS BY MOUTH TWICE WEEKLY ON FRIDAYS AND SATURDAYS*   Yes Historical Provider, MD  mometasone (NASONEX) 50 MCG/ACT nasal spray Place 2 sprays into the nose daily.   Yes Historical Provider, MD  Omega-3 Fatty Acids (FISH OIL) 1000 MG CAPS Take 2 capsules by mouth 2 (two) times daily.    Yes Historical Provider, MD  omeprazole (PRILOSEC) 40 MG capsule Take 40 mg by mouth daily.   Yes Historical Provider, MD  oxyCODONE-acetaminophen (PERCOCET) 10-325 MG per tablet Take 1 tablet by mouth every 6 (six) hours as needed for pain. 05/25/12  Yes Verlee Monte, MD  potassium chloride SA (K-DUR,KLOR-CON) 20 MEQ tablet Take 0.5 tablets (10 mEq total) by mouth daily. 05/25/15  Yes Rexene Alberts, MD  tadalafil (CIALIS) 5 MG tablet Take 5 mg by mouth daily. 05/18/15  Yes Historical Provider, MD  tamsulosin (FLOMAX) 0.4 MG CAPS capsule Take 1 capsule (0.4 mg total) by mouth at bedtime. For prostate treatment. 05/25/15  Yes Rexene Alberts, MD  Vitamin D, Ergocalciferol, (DRISDOL) 50000 UNITS CAPS capsule Take 1  capsule by mouth once a week. 04/27/15  Yes Historical Provider, MD  benzonatate (TESSALON) 100 MG capsule Take 1 capsule (100 mg total) by mouth 3 (three) times daily as needed for cough. Patient not taking: Reported on 08/22/2015 05/25/15   Rexene Alberts, MD   BP 140/97 mmHg  Pulse 75  Temp(Src) 98.3 F (36.8 C)  Resp 20  Ht 5\' 8"  (1.727 m)  Wt 198 lb (89.812 kg)  BMI 30.11 kg/m2  SpO2 91% Physical Exam  Constitutional: He is oriented to person, place, and time. He appears well-developed and well-nourished. No distress.  HENT:  Head: Normocephalic and atraumatic.  Right Ear: Hearing normal.  Left Ear: Hearing normal.  Nose: Nose normal.  Mouth/Throat: Oropharynx is clear and moist and mucous membranes are normal.  Eyes: Conjunctivae and EOM are normal. Pupils are equal, round, and reactive to light.  Neck: Normal range of motion. Neck supple.  Cardiovascular: S1 normal and S2 normal.  An irregularly irregular rhythm present. Tachycardia present.  Exam reveals no gallop and no friction rub.   No murmur heard. Pulmonary/Chest: Effort normal. No respiratory distress. He has decreased breath sounds. He exhibits no tenderness.  Abdominal: Soft. Normal appearance and bowel sounds are normal. There is no hepatosplenomegaly. There is no tenderness. There is no rebound, no guarding, no tenderness at McBurney's point and negative Murphy's sign. No hernia.  Musculoskeletal: Normal range of motion.  Neurological: He is alert and oriented to person, place, and time. He has normal strength. No cranial nerve deficit or sensory deficit. Coordination normal. GCS eye subscore is 4. GCS verbal subscore is 5. GCS motor subscore is 6.  Skin: Skin is warm, dry and intact. No rash noted. No cyanosis.  Psychiatric: He has a normal mood and affect. His speech is normal and behavior is normal. Thought content normal.  Nursing note and vitals reviewed.   ED Course  Procedures (including critical care  time) Labs Review Labs Reviewed  CBC WITH DIFFERENTIAL/PLATELET - Abnormal; Notable for the following:    RBC 4.02 (*)    MCV 101.2 (*)    MCH 34.3 (*)    RDW 17.8 (*)    Platelets 98 (*)    Lymphocytes Relative 8 (*)    Lymphs Abs 0.5 (*)    Monocytes Relative 17 (*)    Monocytes Absolute 1.1 (*)    Eosinophils Relative 7 (*)    All other components within normal limits  COMPREHENSIVE METABOLIC PANEL - Abnormal; Notable for the following:    AST 48 (*)    Total Bilirubin 2.4 (*)    All other components within normal limits  BRAIN NATRIURETIC PEPTIDE - Abnormal; Notable for the following:    B Natriuretic Peptide 358.0 (*)    All other components within normal limits  PROTIME-INR - Abnormal; Notable for the following:    Prothrombin Time 18.5 (*)    INR 1.54 (*)    All other components within normal limits  TROPONIN I  DIGOXIN LEVEL    Imaging Review Dg Chest 2 View  08/22/2015   CLINICAL DATA:  Short of breath. Atrial fibrillation with tachycardia.  EXAM: CHEST  2 VIEW  COMPARISON:  05/22/2015  FINDINGS: Moderate hyperinflation. An upper thoracic a moderate to severe compression deformity is chronic. There also lower thoracic compression deformities which are felt to be similar.  Midline trachea. Normal heart size. Atherosclerosis in the transverse aorta. Right pleural effusion is increased. No pneumothorax. Interstitial thickening is progressive and lower lobe predominant. Mild left base atelectasis. Worsened right base airspace disease.  IMPRESSION: Mild interstitial edema, superimposed upon marked COPD/chronic bronchitis.  Increasing right pleural effusion with adjacent atelectasis or infection. Followup PA and lateral chest X-ray is recommended in 3-4 weeks following trial of antibiotic therapy to ensure resolution and exclude underlying malignancy.  Thoracolumbar compression deformities, suboptimally evaluated.   Electronically Signed   By: Abigail Miyamoto M.D.   On: 08/22/2015  15:00   Ct Angio Chest Pe W/cm &/or Wo Cm  08/22/2015   CLINICAL DATA:  Increasing shortness of breath since last night.  EXAM: CT ANGIOGRAPHY CHEST WITH CONTRAST  TECHNIQUE: Multidetector CT imaging of the chest was performed using the standard protocol during bolus administration of intravenous contrast. Multiplanar CT image reconstructions and MIPs were obtained  to evaluate the vascular anatomy.  CONTRAST:  123mL OMNIPAQUE IOHEXOL 350 MG/ML SOLN  COMPARISON:  06/13/2013 and 03/29/2011 as well as chest x-ray 05/19/2015  FINDINGS: Lungs are adequately inflated and demonstrate a moderate size right pleural effusion and small left pleural effusion which are new. There is associated compressive atelectasis over the right middle lobe and right lower lobes. There is mild diffuse paraseptal emphysematous disease with patchy increased bilateral interstitial markings and mild fibrotic change. There is a 9 mm nodule over the lingula which is not significantly changed from 03/29/2011. Increase in size of a 7 mm nodule slightly more superiorly over the lingula. No new nodules identified. Minimal narrowing of the right middle lobe and lower lobe bronchi due to adjacent adenopathy/mass.  Heart is normal size. There is calcification over the left anterior descending, lateral circumflex and right coronary arteries. There is calcification over the mitral valve annulus. There is no evidence of pulmonary embolism. There is calcified plaque over the thoracic aorta. There is worsening mediastinal and bilateral hilar adenopathy with the largest node over the subcarinal region measuring 2.7 cm by short axis. There is worsening soft tissue density in the right infrahilar region which may represent adenopathy versus central lung mass measuring 2.8 x 4.1 cm. Several small nodes are present over the neck base bilaterally. There are a couple small sub cm bilateral pericardial phrenic lymph nodes.  Images through the upper abdomen  demonstrate several small hypodensities over the left lobe of the liver unchanged from 2012 and likely cysts. There is a nodular contour to the liver unchanged. Mild worsening splenomegaly. There are a few small sub cm lymph nodes in the gastrohepatic ligament. There is thickening of the right corona likely due to mild adjacent retrocrural adenopathy. Bilateral renal cysts are present.  There are degenerative changes of the spine. There is a moderate compression fracture over the upper thoracic spine unchanged as well as mild loss of height of several lower thoracic vertebral bodies unchanged.  Review of the MIP images confirms the above findings.  IMPRESSION: No evidence of pulmonary embolism.  Right infrahilar lymph node versus central lung mass measuring 2.8 x 4.1 cm. Worsening mediastinal adenopathy and bilateral hilar adenopathy as described with the largest node over the subcarinal region measuring 2.7 cm by short axis. Small nodes over the pericardial phrenic region, neck base and upper abdomen as described. New moderate size right pleural effusion and small left pleural effusion with associated atelectasis over the right middle lobe and right lower lobe. 9 mm nodule over the lingula stable since 2012 with slight increase in size of a 7 mm nodule also over the lingula. Findings concerning for central right lung malignancy with metastatic disease. Recommend PET-CT for further evaluation.  Paraseptal emphysematous change with patchy fibrotic changes showing interval progression.  Stable spinal compression fractures described.  Nodular contour of the liver with mild splenomegaly as findings may be due to cirrhosis. Several small stable hypodensities over the left lobe of the liver likely cysts.  Bilateral renal cysts.  Atherosclerotic coronary artery disease.   Electronically Signed   By: Marin Olp M.D.   On: 08/22/2015 17:30   I have personally reviewed and evaluated these images and lab results as part  of my medical decision-making.   EKG Interpretation   Date/Time:  Saturday August 22 2015 13:51:16 EDT Ventricular Rate:  131 PR Interval:  142 QRS Duration: 94 QT Interval:  324 QTC Calculation: 478 R Axis:   -100 Text Interpretation:  Atrial fibrillation with rapid ventricular response  Incomplete right bundle branch block No significant change since last  tracing Confirmed by Laynee Lockamy  MD, Contrina Orona [phone]) on 08/22/2015 2:00:46  PM      MDM   Final diagnoses:  None   pleural effusion  Thoracic lymphadenopathy  Atrial fibrillation with rapid ventricular response  Patient presents to the ER for evaluation of shortness of breath. Patient reports he started noting shortness of breath at 10 PM last night. Symptoms have worsened since then. Upon arrival to the ER, patient is noted to be mildly hypoxic at times. He gets very out of breath with minimal exertion. He has been tachycardic, atrial fibrillation with rapid ventricular response. He did not take his Cardizem today. He was given by mouth Cardizem in the ER for his heart rate.  Also noted to have a history of recurrent PE. His INR has recently been supratherapeutic, but he was treated and is now subtherapeutic. CT scan was therefore performed to further evaluate. No evidence of PE is noted, but he does have a large right-sided pleural effusion and thoracic lymphadenopathy concerning for lung cancer. Patient requiring oxygen here in the ER, will require hospitalization for further workup.    Orpah Greek, MD 08/22/15 [phone]

## 2015-08-22 NOTE — ED Notes (Signed)
Pt c/o increased sob since 10pm last night. Speaking in complete sentences. nad noted.

## 2015-08-22 NOTE — H&P (Signed)
[facility] History and Physical  [name] VOZ:[id number] DOB: 1931-11-01 DOA: 08/22/2015  Referring physician: Dr Betsey Holiday - [facility] PCP: [name], MD   Chief Complaint: SOB  HPI: [name] is a 81 y.o. male  Acute onset shortness of breath. Started at 22:00 on 08/21/2015. Constant. Getting worse. Denies any chest pain. Associate it with intermittent productive cough. Home albuterol treatments with only remittent mild improvement. Nothing makes his symptoms worse. Nothing makes them better.    Review of Systems:  Constitutional:  No weight loss, night sweats, Fevers, chills, fatigue.  HEENT:  No headaches, Difficulty swallowing,Tooth/dental problems,Sore throat,  No sneezing, itching, ear ache, nasal congestion, post nasal drip,  Cardio-vascular:  No chest pain, Orthopnea, PND, swelling in lower extremities, anasarca, dizziness, palpitations  GI:  No heartburn, indigestion, abdominal pain, nausea, vomiting, diarrhea, change in bowel habits, loss of appetite  Resp: Per HPI Skin:  no rash or lesions.  GU:  no dysuria, change in color of urine, no urgency or frequency. No flank pain.  Musculoskeletal:   No joint pain or swelling. No decreased range of motion. No back pain.  Psych:  No change in mood or affect. No depression or anxiety. No memory loss.   Past Medical History  Diagnosis Date  . COPD (chronic obstructive pulmonary disease)     Uses occasional nighttime O2  . Disseminated herpes zoster 2010  . GERD (gastroesophageal reflux disease)   . Asthma   . Pulmonary embolus     2003 and 2011  . Pulmonary nodules     Chest CT 09/11  . Mixed hyperlipidemia   . Chronic atrial fibrillation   . Lupus anticoagulant positive   . Essential hypertension, benign   . Cholelithiasis   . Rheumatoid arthritis(714.0)   . History of chicken pox 1941; 2011  . Anemia   . Chronic lower back pain   . Chronic diastolic heart failure   . Cirrhosis    Questionable, AFP normal Feb 01/2012, hx of ETOH use   . Headache(784.0)   . Cervical spondylosis without myelopathy   . Stage III chronic kidney disease   . Cellulitis of leg, left 01/03/2015  . Aortic stenosis, moderate 05/21/2015   Past Surgical History  Procedure Laterality Date  . Polypectomy  02/02/2012    RMR: Multiple colonic polyps removed, flat, tubular adenomas/ Left-sided diverticulosis  . Posterior lumbar vertebrae excision  2003; 2009; 2011  . Myringotomy      "3 times; both ears"  . Cataract extraction w/ intraocular lens  implant, bilateral    . Cholecystectomy  05/23/2012    Procedure: LAPAROSCOPIC CHOLECYSTECTOMY WITH INTRAOPERATIVE CHOLANGIOGRAM;  Surgeon: Stark Klein, MD;  Location: [facility];  Service: General;  Laterality: N/A;  . Esophagogastroduodenoscopy  02/02/12    QQV:[id number] size hiatal hernia; otherwise normal exam  . Colonoscopy  01/24/2013    Procedure: COLONOSCOPY;  Surgeon: Daneil Dolin, MD;  [facility];  Service: Endoscopy;  Laterality: N/A;  8:30  . Back surgery     Social History:  reports that he quit smoking about 17 years ago. His smoking use included Cigarettes. He has a 57 pack-year smoking history. He has never used smokeless tobacco. He reports that he does not drink alcohol or use illicit drugs.  Allergies  Allergen Reactions  . Cymbalta [Duloxetine Hcl] Other (See Comments)    Confusion   . Procaine Hcl Hives and Other (See Comments)    NOVOCAINE: Sweating, Confusion, Not in right state  of mind.  [name]    unknown    Family History  Problem Relation Age of Onset  . Colon cancer Neg Hx   . Liver disease Neg Hx      Prior to Admission medications   Medication Sig Start Date End Date Taking? Authorizing Provider  albuterol (PROAIR HFA) 108 (90 BASE) MCG/ACT inhaler Inhale 2 puffs into the lungs every 6 (six) hours as needed. Shortness of Breath   Yes Historical Provider, MD  albuterol  (PROVENTIL) (2.5 MG/3ML) 0.083% nebulizer solution Take 2.5 mg by nebulization every 6 (six) hours as needed. Shortness of Breath   Yes Historical Provider, MD  cetirizine (ZYRTEC) 10 MG tablet Take 10 mg by mouth daily.     Yes Historical Provider, MD  digoxin (LANOXIN) 0.125 MG tablet Take this medication EVERY OTHER DAY starting Tuesday 05/26/15. Patient taking differently: Take 0.125 mg by mouth daily. . 05/25/15  Yes Rexene Alberts, MD  diltiazem (CARDIZEM CD) 360 MG 24 hr capsule Take 1 capsule (360 mg total) by mouth daily. 05/25/15  Yes Rexene Alberts, MD  Etanercept (ENBREL) 25 MG/0.5ML SOSY Inject into the skin 2 (two) times a week.    Yes Historical Provider, MD  ferrous sulfate 325 (65 FE) MG tablet Take 325 mg by mouth daily.   Yes Historical Provider, MD  folic acid (FOLVITE) 1 MG tablet Take 1 mg by mouth daily.    Yes Historical Provider, MD  furosemide (LASIX) 40 MG tablet Take 0.5 tablets (20 mg total) by mouth daily. Take daily for prevention of fluid buildup. 05/25/15  Yes Rexene Alberts, MD  gabapentin (NEURONTIN) 300 MG capsule TAKE ONE CAPSULE BY MOUTH EVERY MORNING AND TWO CAPSULES IN THE EVENING Patient taking differently: TAKE TWO CAPSULES BY MOUTH TWICE DAILY 02/03/15  Yes Kathrynn Ducking, MD  ketoconazole (NIZORAL) 2 % shampoo Apply 1 application topically 3 (three) times a week.  05/21/13  Yes Historical Provider, MD  methotrexate (RHEUMATREX) 2.5 MG tablet Take 7.5 mg by mouth 2 (two) times a week. *TAKE 3 TABLETS BY MOUTH TWICE WEEKLY ON FRIDAYS AND SATURDAYS*   Yes Historical Provider, MD  mometasone (NASONEX) 50 MCG/ACT nasal spray Place 2 sprays into the nose daily.   Yes Historical Provider, MD  Omega-3 Fatty Acids (FISH OIL) 1000 MG CAPS Take 2 capsules by mouth 2 (two) times daily.    Yes Historical Provider, MD  omeprazole (PRILOSEC) 40 MG capsule Take 40 mg by mouth daily.   Yes Historical Provider, MD  oxyCODONE-acetaminophen (PERCOCET) 10-325 MG per tablet Take 1  tablet by mouth every 6 (six) hours as needed for pain. 05/25/12  Yes Verlee Monte, MD  potassium chloride SA (K-DUR,KLOR-CON) 20 MEQ tablet Take 0.5 tablets (10 mEq total) by mouth daily. 05/25/15  Yes Rexene Alberts, MD  tadalafil (CIALIS) 5 MG tablet Take 5 mg by mouth daily. 05/18/15  Yes Historical Provider, MD  tamsulosin (FLOMAX) 0.4 MG CAPS capsule Take 1 capsule (0.4 mg total) by mouth at bedtime. For prostate treatment. 05/25/15  Yes Rexene Alberts, MD  Vitamin D, Ergocalciferol, (DRISDOL) 50000 UNITS CAPS capsule Take 1 capsule by mouth once a week. 04/27/15  Yes Historical Provider, MD  benzonatate (TESSALON) 100 MG capsule Take 1 capsule (100 mg total) by mouth 3 (three) times daily as needed for cough. Patient not taking: Reported on 08/22/2015 05/25/15   Rexene Alberts, MD   Physical Exam: Filed Vitals:   08/22/15 1757 08/22/15 1800 08/22/15  1810 08/22/15 1929  BP:  125/74  121/63  Pulse:  112  99  Temp: 97.6 F (36.4 C)   98 F (36.7 C)  TempSrc:    Oral  Resp:  16  16  Height:    5\' 8"  (1.727 m)  Weight:    88.225 kg (194 lb 8 oz)  SpO2:  93% 88% 96%    Wt Readings from Last 3 Encounters:  08/22/15 88.225 kg (194 lb 8 oz)  08/20/15 89.812 kg (198 lb)  06/05/15 94.348 kg (208 lb)    General:  Appears calm and comfortable Eyes:  PERRL, normal lids, irises & conjunctiva ENT:  grossly normal hearing, lips & tongue Neck:  no LAD, masses or thyromegaly Cardiovascular:  Good to appreciate lung sounds due to respiratory status. No appreciable murmur, irregular rate and rhythm, trace lower cavity edema.  Respiratory: Diffuse wheezing with decreased breath sounds at the bases bilaterally and rhonchi. Mild increased respiratory effort. Abdomen:  soft, ntnd Skin:  no rash or induration seen on limited exam Musculoskeletal:  grossly normal tone BUE/BLE Psychiatric:  grossly normal mood and affect, speech fluent and appropriate Neurologic:  grossly non-focal.          Labs on  Admission:  Basic Metabolic Panel:  Recent Labs Lab 08/22/15 1421  NA 137  K 4.4  CL 103  CO2 25  GLUCOSE 82  BUN 11  CREATININE 1.03  CALCIUM 9.1   Liver Function Tests:  Recent Labs Lab 08/22/15 1421  AST 48*  ALT 33  ALKPHOS 55  BILITOT 2.4*  PROT 7.4  ALBUMIN 3.5   No results for input(s): LIPASE, AMYLASE in the last 168 hours. No results for input(s): AMMONIA in the last 168 hours. CBC:  Recent Labs Lab 08/22/15 1421  WBC 6.6  NEUTROABS 4.5  HGB 13.8  HCT 40.7  MCV 101.2*  PLT 98*   Cardiac Enzymes:  Recent Labs Lab 08/22/15 1421  TROPONINI <0.03    BNP (last 3 results)  Recent Labs  05/19/15 1545 05/20/15 0533 08/22/15 1421  BNP 413.0* 382.0* 358.0*    ProBNP (last 3 results) No results for input(s): PROBNP in the last 8760 hours.  CBG: No results for input(s): GLUCAP in the last 168 hours.  Radiological Exams on Admission: Dg Chest 2 View  08/22/2015   CLINICAL DATA:  Short of breath. Atrial fibrillation with tachycardia.  EXAM: CHEST  2 VIEW  COMPARISON:  05/22/2015  FINDINGS: Moderate hyperinflation. An upper thoracic a moderate to severe compression deformity is chronic. There also lower thoracic compression deformities which are felt to be similar.  Midline trachea. Normal heart size. Atherosclerosis in the transverse aorta. Right pleural effusion is increased. No pneumothorax. Interstitial thickening is progressive and lower lobe predominant. Mild left base atelectasis. Worsened right base airspace disease.  IMPRESSION: Mild interstitial edema, superimposed upon marked COPD/chronic bronchitis.  Increasing right pleural effusion with adjacent atelectasis or infection. Followup PA and lateral chest X-ray is recommended in 3-4 weeks following trial of antibiotic therapy to ensure resolution and exclude underlying malignancy.  Thoracolumbar compression deformities, suboptimally evaluated.   Electronically Signed   By: Abigail Miyamoto M.D.   On:  08/22/2015 15:00   Ct Angio Chest Pe W/cm &/or Wo Cm  08/22/2015   CLINICAL DATA:  Increasing shortness of breath since last night.  EXAM: CT ANGIOGRAPHY CHEST WITH CONTRAST  TECHNIQUE: Multidetector CT imaging of the chest was performed using the standard protocol during bolus administration of intravenous  contrast. Multiplanar CT image reconstructions and MIPs were obtained to evaluate the vascular anatomy.  CONTRAST:  12mL OMNIPAQUE IOHEXOL 350 MG/ML SOLN  COMPARISON:  06/13/2013 and 03/29/2011 as well as chest x-ray 05/19/2015  FINDINGS: Lungs are adequately inflated and demonstrate a moderate size right pleural effusion and small left pleural effusion which are new. There is associated compressive atelectasis over the right middle lobe and right lower lobes. There is mild diffuse paraseptal emphysematous disease with patchy increased bilateral interstitial markings and mild fibrotic change. There is a 9 mm nodule over the lingula which is not significantly changed from 03/29/2011. Increase in size of a 7 mm nodule slightly more superiorly over the lingula. No new nodules identified. Minimal narrowing of the right middle lobe and lower lobe bronchi due to adjacent adenopathy/mass.  Heart is normal size. There is calcification over the left anterior descending, lateral circumflex and right coronary arteries. There is calcification over the mitral valve annulus. There is no evidence of pulmonary embolism. There is calcified plaque over the thoracic aorta. There is worsening mediastinal and bilateral hilar adenopathy with the largest node over the subcarinal region measuring 2.7 cm by short axis. There is worsening soft tissue density in the right infrahilar region which may represent adenopathy versus central lung mass measuring 2.8 x 4.1 cm. Several small nodes are present over the neck base bilaterally. There are a couple small sub cm bilateral pericardial phrenic lymph nodes.  Images through the upper  abdomen demonstrate several small hypodensities over the left lobe of the liver unchanged from 2012 and likely cysts. There is a nodular contour to the liver unchanged. Mild worsening splenomegaly. There are a few small sub cm lymph nodes in the gastrohepatic ligament. There is thickening of the right corona likely due to mild adjacent retrocrural adenopathy. Bilateral renal cysts are present.  There are degenerative changes of the spine. There is a moderate compression fracture over the upper thoracic spine unchanged as well as mild loss of height of several lower thoracic vertebral bodies unchanged.  Review of the MIP images confirms the above findings.  IMPRESSION: No evidence of pulmonary embolism.  Right infrahilar lymph node versus central lung mass measuring 2.8 x 4.1 cm. Worsening mediastinal adenopathy and bilateral hilar adenopathy as described with the largest node over the subcarinal region measuring 2.7 cm by short axis. Small nodes over the pericardial phrenic region, neck base and upper abdomen as described. New moderate size right pleural effusion and small left pleural effusion with associated atelectasis over the right middle lobe and right lower lobe. 9 mm nodule over the lingula stable since 2012 with slight increase in size of a 7 mm nodule also over the lingula. Findings concerning for central right lung malignancy with metastatic disease. Recommend PET-CT for further evaluation.  Paraseptal emphysematous change with patchy fibrotic changes showing interval progression.  Stable spinal compression fractures described.  Nodular contour of the liver with mild splenomegaly as findings may be due to cirrhosis. Several small stable hypodensities over the left lobe of the liver likely cysts.  Bilateral renal cysts.  Atherosclerotic coronary artery disease.   Electronically Signed   By: Marin Olp M.D.   On: 08/22/2015 17:30      Assessment/Plan Principal Problem:   Acute respiratory  distress Active Problems:   Rheumatoid arthritis   COPD (chronic obstructive pulmonary disease)   COPD exacerbation   Lung mass   Chronic a-fib   BPH (benign prostatic hyperplasia)   Acute respiratory distress: Likely  multifactorial including pulmonary effusions concerning for malignancy, COPD exacerbation. Patient remains on O2 despite nebulizer treatments. - Tele - Solumedrol - Duoneb - Doxy - Lasix 20 IV - ABG  Pulmonary mass and effusion: Previous imaging had showed stable nodules but current CT showed progressive 2.8 x 4.1 central lung mass as well as moderate right pleural effusion. - Thoracentecis (OK to wait until weekday or will need transfer to [facility]) - H/O consult place in Epic  Afib w/ RVR: intermittent RVR. Chronic condition for pt. Additional Dilt in ED w/ rate control, no need for drip - continue home Dilt - IV Dilt PRN RVR  BPH: - continue Flomax  GERD: - continue PPI  Neuropathy:  - Continue Neurontin.   Rheumatoid arthritis: - continue enbrel, methotrexate    Code Status: FULL DVT Prophylaxis: Hep Family Communication: Son Disposition Plan: Pending improvement  MERRELL, DAVID Lenna Sciara, MD Family Medicine [facility] [email or web address] Password [email or web address]

## 2015-08-23 DIAGNOSIS — J90 Pleural effusion, not elsewhere classified: Secondary | ICD-10-CM | POA: Diagnosis present

## 2015-08-23 DIAGNOSIS — J80 Acute respiratory distress syndrome: Secondary | ICD-10-CM

## 2015-08-23 LAB — CBC
HEMATOCRIT: 39.3 % (ref 39.0–52.0)
Hemoglobin: 13 g/dL (ref 13.0–17.0)
MCH: 33.7 pg (ref 26.0–34.0)
MCHC: 33.1 g/dL (ref 30.0–36.0)
MCV: 101.8 fL — AB (ref 78.0–100.0)
Platelets: 105 10*3/uL — ABNORMAL LOW (ref 150–400)
RBC: 3.86 MIL/uL — ABNORMAL LOW (ref 4.22–5.81)
RDW: 17.8 % — AB (ref 11.5–15.5)
WBC: 3.5 10*3/uL — AB (ref 4.0–10.5)

## 2015-08-23 LAB — BASIC METABOLIC PANEL
Anion gap: 10 (ref 5–15)
BUN: 14 mg/dL (ref 6–20)
CHLORIDE: 102 mmol/L (ref 101–111)
CO2: 26 mmol/L (ref 22–32)
Calcium: 8.9 mg/dL (ref 8.9–10.3)
Creatinine, Ser: 1.29 mg/dL — ABNORMAL HIGH (ref 0.61–1.24)
GFR calc Af Amer: 58 mL/min — ABNORMAL LOW (ref >60)
GFR calc non Af Amer: 50 mL/min — ABNORMAL LOW (ref >60)
GLUCOSE: 166 mg/dL — AB (ref 65–99)
POTASSIUM: 4.8 mmol/L (ref 3.5–5.1)
Sodium: 138 mmol/L (ref 135–145)

## 2015-08-23 LAB — MRSA PCR SCREENING: MRSA by PCR: POSITIVE — AB

## 2015-08-23 MED ORDER — SODIUM CHLORIDE 0.9 % IV SOLN
INTRAVENOUS | Status: AC
  Administered 2015-08-23: 11:00:00 via INTRAVENOUS

## 2015-08-23 MED ORDER — INFLUENZA VAC SPLIT QUAD 0.5 ML IM SUSY
0.5000 mL | PREFILLED_SYRINGE | INTRAMUSCULAR | Status: AC
  Administered 2015-08-24: 0.5 mL via INTRAMUSCULAR
  Filled 2015-08-23: qty 0.5

## 2015-08-23 MED ORDER — DILTIAZEM HCL ER COATED BEADS 180 MG PO CP24
360.0000 mg | ORAL_CAPSULE | Freq: Every day | ORAL | Status: DC
  Administered 2015-08-23 – 2015-08-24 (×2): 360 mg via ORAL
  Filled 2015-08-23 (×2): qty 2

## 2015-08-23 MED ORDER — METHOTREXATE 2.5 MG PO TABS: 7.5000 mg | ORAL_TABLET | ORAL | Status: DC

## 2015-08-23 MED ORDER — METHYLPREDNISOLONE SODIUM SUCC 125 MG IJ SOLR
60.0000 mg | Freq: Two times a day (BID) | INTRAMUSCULAR | Status: DC
  Administered 2015-08-23 – 2015-08-25 (×4): 60 mg via INTRAVENOUS
  Filled 2015-08-23 (×4): qty 2

## 2015-08-23 NOTE — Progress Notes (Signed)
66 Patient's family brought in med from home Enbrel 25mg /0.28mL syringe he takes every Monday & Thursday. 1 syringe total noted in medication box. Attempted to deliver to pharmacy this shift but pharmacy already closed for today. Enbrel box placed in medication drawer in med room. Will report to oncoming shift.

## 2015-08-23 NOTE — Progress Notes (Addendum)
[facility] PROGRESS NOTE  ALEM FAHL DZH:[id number] DOB: 08/20/1931 DOA: 08/22/2015 PCP: [name], MD  Assessment/Plan: Acute Hypoxemic Respiratory Failure -Secondary to pleural effusion and lung mass. -Stable to wait for thoracentesis in am. -No wheezing, start steroid titration.  Lung Mass -Concerning for malignancy. -Quit smoking in 1999 but was a smoker for 57 years. -Hoping to obtain diagnosis with cytology from pleural fluid aspiration, otherwise may need formal mediastinoscopy for biopsy. -Will request oncology consultation in am.  Pleural Effusion -For US-guided thoracentesis in am.  A Fib -Currently rate controlled. -Continue diltiazem, digoxin,  BPH -Continue flomax  GERD -Continue PPI  Peripheral Neuropathy -Continue neurontin  Rheumatoid Arthritis -Continue MTX, enbrel   Code Status: Full code Family Communication: Patient only  Disposition Plan: To be determined   Consultants:  Oncology   Antibiotics:  Doxy   Subjective: Still feels a little SOB, no increased WOB  Objective: Filed Vitals:   08/22/15 2331 08/22/15 2336 08/23/15 0642 08/23/15 0744  BP:   129/63   Pulse:   60   Temp:   97.7 F (36.5 C)   TempSrc:   Oral   Resp:   18   Height:      Weight:      SpO2: 94% 94% 96% 95%   No intake or output data in the 24 hours ending 08/23/15 1033 Filed Weights   08/22/15 1344 08/22/15 1929  Weight: 89.812 kg (198 lb) 88.225 kg (194 lb 8 oz)    Exam:   General:  AA Ox3  Cardiovascular: irregular, systolic murmur  Respiratory: Bilateral crackles, R>L decreased breath sounds  Abdomen:  S/NT/ND/+BS  Extremities: no C/C/E/+pulses   Neurologic:  Grossly intact and non-focal  Data Reviewed: Basic Metabolic Panel:  Recent Labs Lab 08/22/15 1421 08/23/15 0550  NA 137 138  K 4.4 4.8  CL 103 102  CO2 25 26  GLUCOSE 82 166*  BUN 11 14  CREATININE 1.03 1.29*  CALCIUM 9.1 8.9   Liver  Function Tests:  Recent Labs Lab 08/22/15 1421  AST 48*  ALT 33  ALKPHOS 55  BILITOT 2.4*  PROT 7.4  ALBUMIN 3.5   No results for input(s): LIPASE, AMYLASE in the last 168 hours. No results for input(s): AMMONIA in the last 168 hours. CBC:  Recent Labs Lab 08/22/15 1421 08/23/15 0550  WBC 6.6 3.5*  NEUTROABS 4.5  --   HGB 13.8 13.0  HCT 40.7 39.3  MCV 101.2* 101.8*  PLT 98* 105*   Cardiac Enzymes:  Recent Labs Lab 08/22/15 1421  TROPONINI <0.03   BNP (last 3 results)  Recent Labs  05/19/15 1545 05/20/15 0533 08/22/15 1421  BNP 413.0* 382.0* 358.0*    ProBNP (last 3 results) No results for input(s): PROBNP in the last 8760 hours.  CBG: No results for input(s): GLUCAP in the last 168 hours.  No results found for this or any previous visit (from the past 240 hour(s)).   Studies: Dg Chest 2 View  08/22/2015   CLINICAL DATA:  Short of breath. Atrial fibrillation with tachycardia.  EXAM: CHEST  2 VIEW  COMPARISON:  05/22/2015  FINDINGS: Moderate hyperinflation. An upper thoracic a moderate to severe compression deformity is chronic. There also lower thoracic compression deformities which are felt to be similar.  Midline trachea. Normal heart size. Atherosclerosis in the transverse aorta. Right pleural effusion is increased. No pneumothorax. Interstitial thickening is progressive and lower lobe predominant. Mild left base atelectasis. Worsened right base airspace disease.  IMPRESSION: Mild interstitial edema, superimposed upon marked COPD/chronic bronchitis.  Increasing right pleural effusion with adjacent atelectasis or infection. Followup PA and lateral chest X-ray is recommended in 3-4 weeks following trial of antibiotic therapy to ensure resolution and exclude underlying malignancy.  Thoracolumbar compression deformities, suboptimally evaluated.   Electronically Signed   By: Abigail Miyamoto M.D.   On: 08/22/2015 15:00   Ct Angio Chest Pe W/cm &/or Wo Cm  08/22/2015    CLINICAL DATA:  Increasing shortness of breath since last night.  EXAM: CT ANGIOGRAPHY CHEST WITH CONTRAST  TECHNIQUE: Multidetector CT imaging of the chest was performed using the standard protocol during bolus administration of intravenous contrast. Multiplanar CT image reconstructions and MIPs were obtained to evaluate the vascular anatomy.  CONTRAST:  161mL OMNIPAQUE IOHEXOL 350 MG/ML SOLN  COMPARISON:  06/13/2013 and 03/29/2011 as well as chest x-ray 05/19/2015  FINDINGS: Lungs are adequately inflated and demonstrate a moderate size right pleural effusion and small left pleural effusion which are new. There is associated compressive atelectasis over the right middle lobe and right lower lobes. There is mild diffuse paraseptal emphysematous disease with patchy increased bilateral interstitial markings and mild fibrotic change. There is a 9 mm nodule over the lingula which is not significantly changed from 03/29/2011. Increase in size of a 7 mm nodule slightly more superiorly over the lingula. No new nodules identified. Minimal narrowing of the right middle lobe and lower lobe bronchi due to adjacent adenopathy/mass.  Heart is normal size. There is calcification over the left anterior descending, lateral circumflex and right coronary arteries. There is calcification over the mitral valve annulus. There is no evidence of pulmonary embolism. There is calcified plaque over the thoracic aorta. There is worsening mediastinal and bilateral hilar adenopathy with the largest node over the subcarinal region measuring 2.7 cm by short axis. There is worsening soft tissue density in the right infrahilar region which may represent adenopathy versus central lung mass measuring 2.8 x 4.1 cm. Several small nodes are present over the neck base bilaterally. There are a couple small sub cm bilateral pericardial phrenic lymph nodes.  Images through the upper abdomen demonstrate several small hypodensities over the left lobe of the  liver unchanged from 2012 and likely cysts. There is a nodular contour to the liver unchanged. Mild worsening splenomegaly. There are a few small sub cm lymph nodes in the gastrohepatic ligament. There is thickening of the right corona likely due to mild adjacent retrocrural adenopathy. Bilateral renal cysts are present.  There are degenerative changes of the spine. There is a moderate compression fracture over the upper thoracic spine unchanged as well as mild loss of height of several lower thoracic vertebral bodies unchanged.  Review of the MIP images confirms the above findings.  IMPRESSION: No evidence of pulmonary embolism.  Right infrahilar lymph node versus central lung mass measuring 2.8 x 4.1 cm. Worsening mediastinal adenopathy and bilateral hilar adenopathy as described with the largest node over the subcarinal region measuring 2.7 cm by short axis. Small nodes over the pericardial phrenic region, neck base and upper abdomen as described. New moderate size right pleural effusion and small left pleural effusion with associated atelectasis over the right middle lobe and right lower lobe. 9 mm nodule over the lingula stable since 2012 with slight increase in size of a 7 mm nodule also over the lingula. Findings concerning for central right lung malignancy with metastatic disease. Recommend PET-CT for further evaluation.  Paraseptal emphysematous change with patchy fibrotic  changes showing interval progression.  Stable spinal compression fractures described.  Nodular contour of the liver with mild splenomegaly as findings may be due to cirrhosis. Several small stable hypodensities over the left lobe of the liver likely cysts.  Bilateral renal cysts.  Atherosclerotic coronary artery disease.   Electronically Signed   By: Marin Olp M.D.   On: 08/22/2015 17:30    Scheduled Meds: . digoxin  0.125 mg Oral Daily  . diltiazem  360 mg Oral Daily  . doxycycline  100 mg Oral Q12H  . [START ON 08/24/2015]  Etanercept   Subcutaneous Once per day on Mon Thu  . ferrous sulfate  325 mg Oral Daily  . folic acid  1 mg Oral Daily  . gabapentin  600 mg Oral BID  . heparin  5,000 Units Subcutaneous 3 times per day  . [START ON 08/24/2015] Influenza vac split quadrivalent PF  0.5 mL Intramuscular Tomorrow-1000  . ipratropium-albuterol  3 mL Nebulization Q4H WA  . [START ON 08/28/2015] methotrexate  7.5 mg Oral Once per day on Fri Sat  . methylPREDNISolone (SOLU-MEDROL) injection  60 mg Intravenous Q6H  . omega-3 acid ethyl esters  1 g Oral Daily  . pantoprazole  40 mg Oral Daily  . potassium chloride SA  10 mEq Oral Daily  . sodium chloride  3 mL Intravenous Q12H  . sodium chloride  3 mL Intravenous Q12H  . tamsulosin  0.4 mg Oral QHS   Continuous Infusions: . sodium chloride      Principal Problem:   Acute respiratory distress Active Problems:   Lung mass   Pleural effusion   Rheumatoid arthritis   COPD (chronic obstructive pulmonary disease)   COPD exacerbation   Chronic a-fib   BPH (benign prostatic hyperplasia)    Time spent: 30 minutes. Greater than 50% of this time was spent in direct contact with the patient coordinating care.    Lelon Frohlich  [facility] Pager [phone]  If 7PM-7AM, please contact night-coverage at [email or web address], password [facility] 08/23/2015, 10:33 AM  LOS: 1 day

## 2015-08-23 NOTE — Progress Notes (Signed)
Patient refused lasix, did not want to be up all night voiding.

## 2015-08-23 NOTE — Progress Notes (Signed)
0812 New consult to oncology written in consultation book, nursing secretary aware.

## 2015-08-24 ENCOUNTER — Inpatient Hospital Stay (HOSPITAL_COMMUNITY): Payer: Medicare Other

## 2015-08-24 DIAGNOSIS — J90 Pleural effusion, not elsewhere classified: Secondary | ICD-10-CM | POA: Diagnosis not present

## 2015-08-24 LAB — PROTIME-INR
INR: 1.5 — ABNORMAL HIGH (ref 0.00–1.49)
PROTHROMBIN TIME: 18.2 s — AB (ref 11.6–15.2)

## 2015-08-24 LAB — BODY FLUID CELL COUNT WITH DIFFERENTIAL
EOS FL: 0 %
Lymphs, Fluid: 73 %
Monocyte-Macrophage-Serous Fluid: 25 % — ABNORMAL LOW (ref 50–90)
NEUTROPHIL FLUID: 2 % (ref 0–25)
Total Nucleated Cell Count, Fluid: 1739 cu mm — ABNORMAL HIGH (ref 0–1000)

## 2015-08-24 LAB — LACTATE DEHYDROGENASE, PLEURAL OR PERITONEAL FLUID: LD, Fluid: 242 U/L — ABNORMAL HIGH (ref 3–23)

## 2015-08-24 LAB — GRAM STAIN

## 2015-08-24 LAB — GLUCOSE, SEROUS FLUID: Glucose, Fluid: 151 mg/dL

## 2015-08-24 MED ORDER — CHLORHEXIDINE GLUCONATE CLOTH 2 % EX PADS
6.0000 | MEDICATED_PAD | Freq: Every day | CUTANEOUS | Status: DC
  Administered 2015-08-24 – 2015-08-25 (×2): 6 via TOPICAL

## 2015-08-24 MED ORDER — ALBUTEROL SULFATE (2.5 MG/3ML) 0.083% IN NEBU: 2.5000 mg | INHALATION_SOLUTION | RESPIRATORY_TRACT | Status: DC | PRN

## 2015-08-24 MED ORDER — MUPIROCIN 2 % EX OINT
1.0000 "application " | TOPICAL_OINTMENT | Freq: Two times a day (BID) | CUTANEOUS | Status: DC
  Administered 2015-08-24 – 2015-08-25 (×3): 1 via NASAL
  Filled 2015-08-24 (×2): qty 22

## 2015-08-24 NOTE — Progress Notes (Signed)
Thoracentesis complete no signs of distress. 1650 ml amber colored pleural fluid removed.

## 2015-08-24 NOTE — Progress Notes (Signed)
The patient and family was inquired if the Oncology MD would be in to see the patient today to talk with them.  I voiced that I wasn't sure but I would see what I could find out.  Talked to Debbe Bales, PA with oncology about the patients consult.  He stated that the patient would not be seen today due to waiting on further test results.  He stated that he had also ordered a CT scan for the am.  He stated that he would try to see the patient tomorrow, but it may be Wednesday when the patients test results post.  I also notified Dr. Jerilee Hoh due to the patient having expiratory wheezing and asked about some PRN nebulizer.  I voiced to her the above conversation that I had with Marcello Moores.  She stated she would check in with [facility] tomorrow.  New orders given by Dr. Jerilee Hoh and respiratory was notified for the treatment.  The family was also updated on the above POC.

## 2015-08-24 NOTE — Progress Notes (Signed)
Pt allergy to Novocaine. Lidocaine given by MD after discussing with patient.

## 2015-08-24 NOTE — Progress Notes (Signed)
[facility] PROGRESS NOTE  [name] LZJ:[id number] DOB: 1931/01/06 DOA: 08/22/2015 PCP: [name], MD  Assessment/Plan: Acute Hypoxemic Respiratory Failure -Secondary to pleural effusion and lung mass. Improving. sats 91% on 2L. Thoracentesis today. No wheezing, continue steroid titration.  Lung Mass -Concerning for malignancy. -Quit smoking in 1999 but was a smoker for 57 years. -Hoping to obtain diagnosis with cytology from pleural fluid aspiration, otherwise may need formal mediastinoscopy for biopsy. -Await oncology consultation.  Pleural Effusion -For US-guided thoracentesis today.  A Fib -remains rate controlled.Continue diltiazem, digoxin,  BPH - stable at baseline. Continue flomax  GERD - stable at baseline Continue PPI  Peripheral Neuropathy - stable at baseline. Continue neurontin  Rheumatoid Arthritis -Continue MTX, enbrel  CKD-III. Stable monitor. Urine output good  Code Status: full Family Communication: none present Disposition Plan: home when ready   Consultants:  oncology  Procedures:  Thoracentesis 08/24/15  Antibiotics:  Doxy 08/22/15>>  HPI/Subjective: Sitting up in bed watching TV. Reports breathing much better. Denies pain/discomfort  Objective: Filed Vitals:   08/24/15 0633  BP: 159/59  Pulse: 110  Temp: 98.8 F (37.1 C)  Resp: 18    Intake/Output Summary (Last 24 hours) at 08/24/15 0940 Last data filed at 08/24/15 0240  Gross per 24 hour  Intake 1413.75 ml  Output    300 ml  Net 1113.75 ml   Filed Weights   08/22/15 1344 08/22/15 1929  Weight: 89.812 kg (198 lb) 88.225 kg (194 lb 8 oz)    Exam:   General:  Appears comfortable  Cardiovascular: irregularly irregular no gallup no rub no LE edema  Respiratory: normal effort BS distant particularly on right  Abdomen: flat soft +BS non-tender  Musculoskeletal: joints without swelling/erythema   Data Reviewed: Basic Metabolic  Panel:  Recent Labs Lab 08/22/15 1421 08/23/15 0550  NA 137 138  K 4.4 4.8  CL 103 102  CO2 25 26  GLUCOSE 82 166*  BUN 11 14  CREATININE 1.03 1.29*  CALCIUM 9.1 8.9   Liver Function Tests:  Recent Labs Lab 08/22/15 1421  AST 48*  ALT 33  ALKPHOS 55  BILITOT 2.4*  PROT 7.4  ALBUMIN 3.5   No results for input(s): LIPASE, AMYLASE in the last 168 hours. No results for input(s): AMMONIA in the last 168 hours. CBC:  Recent Labs Lab 08/22/15 1421 08/23/15 0550  WBC 6.6 3.5*  NEUTROABS 4.5  --   HGB 13.8 13.0  HCT 40.7 39.3  MCV 101.2* 101.8*  PLT 98* 105*   Cardiac Enzymes:  Recent Labs Lab 08/22/15 1421  TROPONINI <0.03   BNP (last 3 results)  Recent Labs  05/19/15 1545 05/20/15 0533 08/22/15 1421  BNP 413.0* 382.0* 358.0*    ProBNP (last 3 results) No results for input(s): PROBNP in the last 8760 hours.  CBG: No results for input(s): GLUCAP in the last 168 hours.  Recent Results (from the past 240 hour(s))  MRSA PCR Screening     Status: Abnormal   Collection Time: 08/23/15  9:11 AM  Result Value Ref Range Status   MRSA by PCR POSITIVE (A) NEGATIVE Final    Comment:        The GeneXpert MRSA Assay (FDA approved for NASAL specimens only), is one component of a comprehensive MRSA colonization surveillance program. It is not intended to diagnose MRSA infection nor to guide or monitor treatment for MRSA infections. RESULT CALLED TO, READ BACK BY AND VERIFIED WITH: BULLINS,M AT 1118 ON 08/23/15 BY MOSLEY,J  Studies: Dg Chest 2 View  08/22/2015   CLINICAL DATA:  Short of breath. Atrial fibrillation with tachycardia.  EXAM: CHEST  2 VIEW  COMPARISON:  05/22/2015  FINDINGS: Moderate hyperinflation. An upper thoracic a moderate to severe compression deformity is chronic. There also lower thoracic compression deformities which are felt to be similar.  Midline trachea. Normal heart size. Atherosclerosis in the transverse aorta. Right pleural  effusion is increased. No pneumothorax. Interstitial thickening is progressive and lower lobe predominant. Mild left base atelectasis. Worsened right base airspace disease.  IMPRESSION: Mild interstitial edema, superimposed upon marked COPD/chronic bronchitis.  Increasing right pleural effusion with adjacent atelectasis or infection. Followup PA and lateral chest X-ray is recommended in 3-4 weeks following trial of antibiotic therapy to ensure resolution and exclude underlying malignancy.  Thoracolumbar compression deformities, suboptimally evaluated.   Electronically Signed   By: Abigail Miyamoto M.D.   On: 08/22/2015 15:00   Ct Angio Chest Pe W/cm &/or Wo Cm  08/22/2015   CLINICAL DATA:  Increasing shortness of breath since last night.  EXAM: CT ANGIOGRAPHY CHEST WITH CONTRAST  TECHNIQUE: Multidetector CT imaging of the chest was performed using the standard protocol during bolus administration of intravenous contrast. Multiplanar CT image reconstructions and MIPs were obtained to evaluate the vascular anatomy.  CONTRAST:  114mL OMNIPAQUE IOHEXOL 350 MG/ML SOLN  COMPARISON:  06/13/2013 and 03/29/2011 as well as chest x-ray 05/19/2015  FINDINGS: Lungs are adequately inflated and demonstrate a moderate size right pleural effusion and small left pleural effusion which are new. There is associated compressive atelectasis over the right middle lobe and right lower lobes. There is mild diffuse paraseptal emphysematous disease with patchy increased bilateral interstitial markings and mild fibrotic change. There is a 9 mm nodule over the lingula which is not significantly changed from 03/29/2011. Increase in size of a 7 mm nodule slightly more superiorly over the lingula. No new nodules identified. Minimal narrowing of the right middle lobe and lower lobe bronchi due to adjacent adenopathy/mass.  Heart is normal size. There is calcification over the left anterior descending, lateral circumflex and right coronary arteries.  There is calcification over the mitral valve annulus. There is no evidence of pulmonary embolism. There is calcified plaque over the thoracic aorta. There is worsening mediastinal and bilateral hilar adenopathy with the largest node over the subcarinal region measuring 2.7 cm by short axis. There is worsening soft tissue density in the right infrahilar region which may represent adenopathy versus central lung mass measuring 2.8 x 4.1 cm. Several small nodes are present over the neck base bilaterally. There are a couple small sub cm bilateral pericardial phrenic lymph nodes.  Images through the upper abdomen demonstrate several small hypodensities over the left lobe of the liver unchanged from 2012 and likely cysts. There is a nodular contour to the liver unchanged. Mild worsening splenomegaly. There are a few small sub cm lymph nodes in the gastrohepatic ligament. There is thickening of the right corona likely due to mild adjacent retrocrural adenopathy. Bilateral renal cysts are present.  There are degenerative changes of the spine. There is a moderate compression fracture over the upper thoracic spine unchanged as well as mild loss of height of several lower thoracic vertebral bodies unchanged.  Review of the MIP images confirms the above findings.  IMPRESSION: No evidence of pulmonary embolism.  Right infrahilar lymph node versus central lung mass measuring 2.8 x 4.1 cm. Worsening mediastinal adenopathy and bilateral hilar adenopathy as described with the largest node  over the subcarinal region measuring 2.7 cm by short axis. Small nodes over the pericardial phrenic region, neck base and upper abdomen as described. New moderate size right pleural effusion and small left pleural effusion with associated atelectasis over the right middle lobe and right lower lobe. 9 mm nodule over the lingula stable since 2012 with slight increase in size of a 7 mm nodule also over the lingula. Findings concerning for central right  lung malignancy with metastatic disease. Recommend PET-CT for further evaluation.  Paraseptal emphysematous change with patchy fibrotic changes showing interval progression.  Stable spinal compression fractures described.  Nodular contour of the liver with mild splenomegaly as findings may be due to cirrhosis. Several small stable hypodensities over the left lobe of the liver likely cysts.  Bilateral renal cysts.  Atherosclerotic coronary artery disease.   Electronically Signed   By: Marin Olp M.D.   On: 08/22/2015 17:30    Scheduled Meds: . digoxin  0.125 mg Oral Daily  . diltiazem  360 mg Oral Daily  . doxycycline  100 mg Oral Q12H  . Etanercept  25 mg Subcutaneous Once per day on Mon Thu  . ferrous sulfate  325 mg Oral Daily  . folic acid  1 mg Oral Daily  . gabapentin  600 mg Oral BID  . heparin  5,000 Units Subcutaneous 3 times per day  . Influenza vac split quadrivalent PF  0.5 mL Intramuscular Tomorrow-1000  . ipratropium-albuterol  3 mL Nebulization Q4H WA  . [START ON 08/28/2015] methotrexate  7.5 mg Oral Once per day on Fri Sat  . methylPREDNISolone (SOLU-MEDROL) injection  60 mg Intravenous Q12H  . omega-3 acid ethyl esters  1 g Oral Daily  . pantoprazole  40 mg Oral Daily  . potassium chloride SA  10 mEq Oral Daily  . sodium chloride  3 mL Intravenous Q12H  . sodium chloride  3 mL Intravenous Q12H  . tamsulosin  0.4 mg Oral QHS   Continuous Infusions:   Principal Problem:   Acute respiratory distress Active Problems:   Rheumatoid arthritis   COPD (chronic obstructive pulmonary disease)   COPD exacerbation   Lung mass   Chronic a-fib   BPH (benign prostatic hyperplasia)   Pleural effusion    Time spent: 35 minutes    [facility] Pager [phone]. If 7PM-7AM, please contact night-coverage at [email or web address], password [facility] 08/24/2015, 9:40 AM  LOS: 2 days

## 2015-08-25 ENCOUNTER — Inpatient Hospital Stay (HOSPITAL_COMMUNITY): Payer: Medicare Other

## 2015-08-25 ENCOUNTER — Other Ambulatory Visit (HOSPITAL_COMMUNITY): Payer: Self-pay | Admitting: Oncology

## 2015-08-25 ENCOUNTER — Encounter (HOSPITAL_COMMUNITY): Payer: Self-pay | Admitting: Radiology

## 2015-08-25 DIAGNOSIS — I4891 Unspecified atrial fibrillation: Secondary | ICD-10-CM | POA: Insufficient documentation

## 2015-08-25 DIAGNOSIS — R918 Other nonspecific abnormal finding of lung field: Secondary | ICD-10-CM

## 2015-08-25 LAB — BASIC METABOLIC PANEL
ANION GAP: 6 (ref 5–15)
BUN: 41 mg/dL — ABNORMAL HIGH (ref 6–20)
CHLORIDE: 98 mmol/L — AB (ref 101–111)
CO2: 27 mmol/L (ref 22–32)
CREATININE: 1.96 mg/dL — AB (ref 0.61–1.24)
Calcium: 9.1 mg/dL (ref 8.9–10.3)
GFR calc non Af Amer: 30 mL/min — ABNORMAL LOW (ref >60)
GFR, EST AFRICAN AMERICAN: 35 mL/min — AB (ref >60)
Glucose, Bld: 148 mg/dL — ABNORMAL HIGH (ref 65–99)
POTASSIUM: 5.6 mmol/L — AB (ref 3.5–5.1)
SODIUM: 131 mmol/L — AB (ref 135–145)

## 2015-08-25 MED ORDER — DIGOXIN 125 MCG PO TABS: 0.1250 mg | ORAL_TABLET | Freq: Every day | ORAL | Status: DC

## 2015-08-25 MED ORDER — DOXYCYCLINE HYCLATE 100 MG PO TABS: 100.0000 mg | ORAL_TABLET | Freq: Two times a day (BID) | ORAL | Status: DC

## 2015-08-25 MED ORDER — IOHEXOL 300 MG/ML  SOLN
50.0000 mL | Freq: Once | INTRAMUSCULAR | Status: AC | PRN
  Administered 2015-08-25: 50 mL via ORAL

## 2015-08-25 MED ORDER — GABAPENTIN 300 MG PO CAPS: 600.0000 mg | ORAL_CAPSULE | Freq: Two times a day (BID) | ORAL | Status: DC

## 2015-08-25 MED ORDER — PREDNISONE 10 MG PO TABS: ORAL_TABLET | ORAL | Status: DC

## 2015-08-25 MED ORDER — ALBUTEROL SULFATE (2.5 MG/3ML) 0.083% IN NEBU: 2.5000 mg | INHALATION_SOLUTION | Freq: Four times a day (QID) | RESPIRATORY_TRACT | Status: DC | PRN

## 2015-08-25 MED ORDER — POTASSIUM CHLORIDE CRYS ER 20 MEQ PO TBCR
10.0000 meq | EXTENDED_RELEASE_TABLET | Freq: Every day | ORAL | Status: DC
Start: 2015-08-25 — End: 2015-12-03

## 2015-08-25 MED ORDER — ALBUTEROL SULFATE HFA 108 (90 BASE) MCG/ACT IN AERS: 2.0000 | INHALATION_SPRAY | Freq: Four times a day (QID) | RESPIRATORY_TRACT | Status: AC | PRN

## 2015-08-25 NOTE — Progress Notes (Signed)
Discharge instructions and prescriptions given to patient's daughter who verbalized understanding, out in stable condition with oxygen via w/c with staff.

## 2015-08-25 NOTE — Care Management Note (Signed)
Case Management Note  Patient Details  Name: [name] MRN: [id number] Date of Birth: 08/21/1931  Expected Discharge Date:  08/24/15               Expected Discharge Plan:  Home/Self Care  In-House Referral:  NA  Discharge planning Services  CM Consult  Post Acute Care Choice:  Durable Medical Equipment Choice offered to:  Patient  DME Arranged:  Oxygen DME Agency:  [place]:    [facility] Agency:     Status of Service:  Completed, signed off  Medicare Important Message Given:    Date Medicare IM Given:    Medicare IM give by:    Date Additional Medicare IM Given:    Additional Medicare Important Message give by:     If discussed at [place] of Stay Meetings, dates discussed:    Additional Comments: Pt is from home lives alone and is independent at baseline with cane. Pt plans to return home with self care at DC. Pt has met qualifications for home O2 and has requests [facility] for home O2. Romualdo Bolk, of [facility], made aware of referral and wil obtain pt info from chart. Pt will have port O2 tank delivered to room prior to discharge. Pt has no further CM needs.  Sherald Barge, RN 08/25/2015, 11:19 AM

## 2015-08-25 NOTE — Consult Note (Signed)
[facility] Consultation Oncology  Name: [name]      MRN: [id number]    Location: A301/A301-01  Date: 08/25/2015 Time:7:24 AM   REFERRING PHYSICIAN:  Erline Hau, MD  REASON FOR CONSULT:  ? New lung cancer?   DIAGNOSIS:  Right infrahilar lymph node versus central lung mass measuring 2.8 x 4.1 cm with mediastinal and bilateral hilar adenopathy with the largest node measuring 2.7 cm over the subcarinal region with a new right pleural effusion, moderate in size.  HISTORY OF PRESENT ILLNESS:   [name] (goes by [name]) is a 81 yo white American man with a past medical history significant for tobacco abuse (57 pack years), EtOH abuse quitting in 1976, HTN, H/O DVT/VTE x 3, chronic A-fib on Vitamin K antagonist anticoagulation, cirrhosis of liver, COPD, RA, chronic heart failure, Stage III chronic renal disease who presented to [name] on 08/22/2015 with SOB.  ED work-up included a chest xray demonstrating mild edema superimposed on COPD.  This was followed by a CTA of chest to evaluate for PE given his clotting history which demonstrated a right infrahilar lymph node versus central lung mass measuring 2.8 x 4.1 cm with mediastinal and bilateral hilar adenopathy with the largest node measuring 2.7 cm over the subcarinal region with a new right pleural effusion, moderate in size.  Given this new finding, his hypoxia, and A-fib with RVR, he was admitted for further evaluation and treatment.  I personally reviewed and went over laboratory results with the patient.  The results are noted within this dictation.  Chronic thrombocytopenia dates back to at least 2009 according to [name].  I personally reviewed and went over radiographic studies with the patient.  The results are noted within this dictation.    Chart reviewed.  The patient admits that he came to the ED due to SOB.  He does have O2 at home, but he admits that he does not use it.  He reports his appetite has  been up and down.  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  He admits to a cough that is dry and non-productive.  He reports it "comes and goes."  He denies any hemoptysis, GI blood loss, abd pain, chest pain, nausea and vomiting, dizziness, headache, double vision.  Prior to his hospitalization, he reports that he has been active without any obvious signs of "slowing down."  He is a widower.  His wife passed in May 2014 from [facility].  She was treated by Dr. Jacquiline Mann in [place].     PAST MEDICAL HISTORY:   Past Medical History  Diagnosis Date  . COPD (chronic obstructive pulmonary disease)     Uses occasional nighttime O2  . Disseminated herpes zoster 2010  . GERD (gastroesophageal reflux disease)   . Asthma   . Pulmonary embolus     2003 and 2011  . Pulmonary nodules     Chest CT 09/11  . Mixed hyperlipidemia   . Chronic atrial fibrillation   . Lupus anticoagulant positive   . Essential hypertension, benign   . Cholelithiasis   . Rheumatoid arthritis(714.0)   . History of chicken pox 1941; 2011  . Anemia   . Chronic lower back pain   . Chronic diastolic heart failure   . Cirrhosis     Questionable, AFP normal Feb 01/2012, hx of ETOH use   . Headache(784.0)   . Cervical  spondylosis without myelopathy   . Stage III chronic kidney disease   . Cellulitis of leg, left 01/03/2015  . Aortic stenosis, moderate 05/21/2015    ALLERGIES: Allergies  Allergen Reactions  . Cymbalta [Duloxetine Mann] Other (See Comments)    Confusion   . Procaine Mann Hives and Other (See Comments)    NOVOCAINE: Sweating, Confusion, Not in right state of mind.  [name]] Other (See Comments)    unknown      MEDICATIONS: I have reviewed the patient's current medications.     PAST SURGICAL HISTORY Past Surgical History  Procedure Laterality Date  . Polypectomy  02/02/2012    RMR: Multiple colonic polyps removed, flat, tubular adenomas/  Left-sided diverticulosis  . Posterior lumbar vertebrae excision  2003; 2009; 2011  . Myringotomy      "3 times; both ears"  . Cataract extraction w/ intraocular lens  implant, bilateral    . Cholecystectomy  05/23/2012    Procedure: LAPAROSCOPIC CHOLECYSTECTOMY WITH INTRAOPERATIVE CHOLANGIOGRAM;  Surgeon: Tony Klein, MD;  Location: [facility];  Service: General;  Laterality: N/A;  . Esophagogastroduodenoscopy  02/02/12    BZJ:IRCVELFY size hiatal hernia; otherwise normal exam  . Colonoscopy  01/24/2013    Procedure: COLONOSCOPY;  Surgeon: Tony Dolin, MD;  [facility];  Service: Endoscopy;  Laterality: N/A;  8:30  . Back surgery      FAMILY HISTORY: Family History  Problem Relation Age of Onset  . Colon cancer Neg Hx   . Liver disease Neg Hx    He notes that in May 1999, his wife passed from [name].  She was treated by Dr. Jacquiline Mann in [place].  He has 4 biological children and 2 twins (son and daughter) who were adopted.  "May twin son says he is 6 months clean; but you know what that means."  His oldest child was born in 35 and his youngest was born in 55.  He has 13 grandchildren and 13 great-grandchildren.  SOCIAL HISTORY:  reports that he quit smoking about 17 years ago. His smoking use included Cigarettes. He has a 57 pack-year smoking history. He has never used smokeless tobacco. He reports that he does not drink alcohol or use illicit drugs.  He admits to a history of EtOH abuse drinking a 5th of moonshine weekly.  He did not think he was a heavy drinker, but he notes that his wife did.  He quit in 1976.    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  PERFORMANCE STATUS: The patient's performance status is 1 - Symptomatic but completely ambulatory  PHYSICAL EXAM: Most Recent Vital Signs: Blood pressure 144/75, pulse 93, temperature 97.9 F (36.6 C), temperature source Oral, resp. rate 18, height 5\' 8"   (1.727 m), weight 194 lb 8 oz (88.225 kg), SpO2 98 %. General appearance: alert, cooperative, appears older than stated age, no distress and unaccompanied in Hospital room Head: Normocephalic, without obvious abnormality, atraumatic Eyes: negative findings: lids and lashes normal, conjunctivae and sclerae normal and corneas clear Throat: normal findings: lips normal without lesions, buccal mucosa normal, tongue midline and normal and soft palate, uvula, and tonsils normal Neck: no adenopathy and supple, symmetrical, trachea midline Back: symmetric, no curvature. ROM normal. No CVA tenderness. Lungs: diminished breath sounds bilaterally and wheezes RML and RUL Heart: irregularly irregular rhythm Abdomen: normal findings: no masses palpable and nontender and abnormal  findings:  hard Extremities: extremities normal, atraumatic, no cyanosis or edema Skin: Skin color, texture, turgor normal. No rashes or lesions Lymph nodes: Cervical, supraclavicular, and axillary nodes normal. Neurologic: Alert and oriented X 3, normal strength and tone. Normal symmetric reflexes. Normal coordination and gait  LABORATORY DATA:  Results for orders placed or performed during the hospital encounter of 08/22/15 (from the past 48 hour(s))  MRSA PCR Screening     Status: Abnormal   Collection Time: 08/23/15  9:11 AM  Result Value Ref Range   MRSA by PCR POSITIVE (A) NEGATIVE    Comment:        The GeneXpert MRSA Assay (FDA approved for NASAL specimens only), is one component of a comprehensive MRSA colonization surveillance program. It is not intended to diagnose MRSA infection nor to guide or monitor treatment for MRSA infections. RESULT CALLED TO, READ BACK BY AND VERIFIED WITH: BULLINS,M AT 1118 ON 08/23/15 BY MOSLEY,J   Protime-INR     Status: Abnormal   Collection Time: 08/24/15 12:12 PM  Result Value Ref Range   Prothrombin Time 18.2 (H) 11.6 - 15.2 seconds   INR 1.50 (H) 0.00 - 1.49  Body fluid  cell count with differential     Status: Abnormal   Collection Time: 08/24/15  3:05 PM  Result Value Ref Range   Fluid Type-FCT FLUID     Comment: RIGHT PLEURAL CORRECTED ON 08/29 AT 1600: PREVIOUSLY REPORTED AS Body Fluid    Color, Fluid YELLOW YELLOW   Appearance, Fluid CLOUDY (A) CLEAR   WBC, Fluid 1739 (H) 0 - 1000 cu mm   Neutrophil Count, Fluid 2 0 - 25 %   Lymphs, Fluid 73 %   Monocyte-Macrophage-Serous Fluid 25 (L) 50 - 90 %   Eos, Fluid 0 %  Glucose, pleural or peritoneal fluid     Status: None   Collection Time: 08/24/15  3:05 PM  Result Value Ref Range   Glucose, Fluid 151 mg/dL    Comment: (NOTE) No normal range established for this test Results should be evaluated in conjunction with serum values    Fluid Type-FGLU FLUID     Comment: RIGHT PLEURAL   Lactate dehydrogenase (CSF, pleural or peritoneal fluid)     Status: Abnormal   Collection Time: 08/24/15  3:05 PM  Result Value Ref Range   LD, Fluid 242 (H) 3 - 23 U/L    Comment: (NOTE) Results should be evaluated in conjunction with serum values    Fluid Type-FLDH FLUID     Comment: RIGHT PLEURAL   Gram stain     Status: None   Collection Time: 08/24/15  3:05 PM  Result Value Ref Range   Specimen Description FLUID RIGHT PLEURAL    Special Requests NONE    Gram Stain      CYTOSPIN SMEAR WBC PRESENT,BOTH PMN AND MONONUCLEAR NO ORGANISMS SEEN Performed at [facility]    Report Status 08/24/2015 FINAL       RADIOGRAPHY: Dg Chest 1 View  08/24/2015   CLINICAL DATA:  81 year old male with right pleural effusions status post thoracentesis.  EXAM: CHEST  1 VIEW  COMPARISON:  Chest x-ray 07/15/2015.  FINDINGS: Lung volumes are low. There are bibasilar opacities that reflect a combination of areas of chronic scarring and probable interstitial lung disease. Previously noted large right pleural effusion has significantly decreased in size, now small to moderate. No pneumothorax. No acute consolidative  airspace disease. Cephalization of the pulmonary vasculature, without frank pulmonary edema.  Heart size is normal. Widening of mediastinal contours, compatible with known lymphadenopathy. Atherosclerosis in the thoracic aorta.  IMPRESSION: 1. Decreased size of right-sided pleural effusion following thoracentesis. No pneumothorax or other acute complicating features. 2. The appearance of the lungs again suggests underlying interstitial lung disease. 3. Mediastinal and bilateral hilar lymphadenopathy again noted.   Electronically Signed   By: Vinnie Langton M.D.   On: 08/24/2015 15:48   US Thoracentesis Asp Pleural Space W/img Guide  08/24/2015   CLINICAL DATA:  81 year old male with shortness of breath. Large right pleural effusion.  EXAM: ULTRASOUND GUIDED RIGHT-SIDED THORACENTESIS  COMPARISON:  Chest CT 08/22/2015.  PROCEDURE: An ultrasound guided thoracentesis was thoroughly discussed with the patient and questions answered. The benefits, risks, alternatives and complications were also discussed. The patient understands and wishes to proceed with the procedure. Written consent was obtained.  Ultrasound was performed to localize and mark an adequate pocket of fluid in the right chest. The area was then prepped and draped in the normal sterile fashion. 1% Lidocaine was used for local anesthesia. Under ultrasound guidance a 19 gauge Yueh catheter was introduced. Thoracentesis was performed. The catheter was removed and a dressing applied.  Complications:  None.  FINDINGS: A total of approximately 1,650 mL of serosanguineous fluid was removed. A fluid sample was sent for laboratory analysis.  IMPRESSION: Successful ultrasound guided right-sided thoracentesis yielding 1,650 mL of pleural fluid.   Electronically Signed   By: Vinnie Langton M.D.   On: 08/24/2015 16:08       PATHOLOGY:  PENDING   ASSESSMENT/PLAN:   81 yo white American man presented to the [facility] ED on 08/22/2015 with acute onset of SOB  with PE work-up demonstrating a right infrahilar lymph node versus central lung mass measuring 2.8 x 4.1 cm with mediastinal and bilateral hilar adenopathy with the largest node measuring 2.7 cm over the subcarinal region with a new right pleural effusion, moderate in size (Negative PE work-up).  He is S/P diagnostic thoracentesis on right on 08/24/2015 relieving 1650 mL of fluid which was sent for cytology.  Findings suspicious for a primary bronchogenic carcinoma, but will need to wait on cytology report.  I have ordered a CT Abd/pelvis with contrast.  Depending on cytology from thoracentesis, further biopsy may be needed via IR or CTS.  Will await future imaging.  Once discharged from the hospital, he will need an outpatient PET scan to complete staging.  He will need an outpatient appointment approximately 1 week following discharge after PET imaging (if possible).    Depending on timing of results from testing and the patient's hospital course, will follow the patient while in the hospital.  When patient is deemed fit for discharge, we will follow as an outpatient.  Please let us know when patient is near his discharge date so we can help coordinate outpatient appointments.   Chronic thrombocytopenia dating back to at least 2009 noted without any acute changes.   Cirrhosis of liver noted in the chart.  H/O DVT in LE x 3 requiring anticoagulation.  According to the patient, he was treated with Vitamin K Antagonist for each episode, but now he is on it chronically due to his A-Fib.  INRs are monitored by primary care provider, Dr. Edrick Oh.  H/O of tobacco abuse quitting in 1999.  Used to smoke 1 ppd x 57 years (57 pack years).    H/O EtOH abuse quitting in 1976.  Used to drink moonshine, drinking a 5th per week (  at least).    Rheumatoid arthritis, currently on treatment (on Enbrel and Methotrexate according to the patient).  A-fib on chronic anticoagulation with Coumadin, monitored by primary care  provider, Dr. Edrick Oh.   Patient Active Problem List   Diagnosis Date Noted  . Pleural effusion 08/23/2015  . Acute respiratory distress 08/22/2015  . Lung mass 08/22/2015  . Chronic a-fib 08/22/2015  . BPH (benign prostatic hyperplasia) 08/22/2015  . AKI (acute kidney injury) 05/23/2015  . Acute diastolic CHF (congestive heart failure)   . Aortic stenosis, moderate 05/21/2015  . Acute on chronic diastolic CHF (congestive heart failure) 05/19/2015  . COPD exacerbation 05/19/2015  . Acute kidney injury 01/05/2015  . Thrombocytopenia 01/05/2015  . Stage III chronic kidney disease 01/05/2015  . Cellulitis of leg, left 01/03/2015  . Stiffness of joints, not elsewhere classified, multiple sites 01/28/2014  . Posture imbalance 01/28/2014  . Headache(784.0) 10/21/2013  . Cervical spondylosis without myelopathy 10/21/2013  . Chronic diastolic heart failure 46/65/9935  . Adenomatous polyps 01/11/2013  . Anemia 10/13/2012  . COPD (chronic obstructive pulmonary disease) 05/21/2012  . Long term current use of anticoagulant 05/21/2012  . Arthritis 05/21/2012  . Lupus anticoagulant positive 01/18/2012  . GERD (gastroesophageal reflux disease) 01/18/2012  . Cirrhosis 01/18/2012    Class: Question of  . Essential hypertension, benign 11/02/2010  . PULMONARY EMBOLISM 11/02/2010  . Chronic atrial fibrillation 11/02/2010  . Rheumatoid arthritis 11/02/2010     All questions were answered. The patient knows to call the clinic with any problems, questions or concerns. We can certainly see the patient much sooner if necessary.  Patient and plan discussed with Dr. Ancil Linsey and she is in agreement with the aforementioned.    KEFALAS,THOMAS, PA-C 08/25/2015 7:24 AM    AS above. Patient discussed with [name] in detail. Ct abdomen ordered. Will f/u as outpatient. Donald Pore MD

## 2015-08-25 NOTE — Care Management Important Message (Signed)
Important Message  Patient Details  Name: [name] MRN: [id number] Date of Birth: [birth year]   Medicare Important Message Given:  Yes-second notification given    Sherald Barge, RN 08/25/2015, 11:28 AM

## 2015-08-25 NOTE — Discharge Summary (Signed)
Physician Discharge Summary  [name] HBZ:[id number] DOB: August 02, 1931 DOA: 08/22/2015  PCP: [name], MD  Admit date: 08/22/2015 Discharge date: 08/25/2015  Time spent: 40 minutes  Recommendations for Outpatient Follow-up:  1. Follow up with oncology as scheduled for evaluation of PET scan. 2. Follow up with PCP 1-2 weeks. Recommend bmet to track potassium  Discharge Diagnoses:  Principal Problem:   Acute respiratory distress Active Problems:   Rheumatoid arthritis   COPD (chronic obstructive pulmonary disease)   Chronic diastolic heart failure   Stage III chronic kidney disease   COPD exacerbation   Lung mass   Chronic a-fib   BPH (benign prostatic hyperplasia)   Pleural effusion   Discharge Condition: fair  Diet recommendation: heart healthy  Filed Weights   08/22/15 1344 08/22/15 1929  Weight: 89.812 kg (198 lb) 88.225 kg (194 lb 8 oz)    History of present illness:  Acute onset shortness of breath. Started on  at 22:00 on 08/21/2015. Constant. Worsened so came to ED on 08/22/15. Denied chest pain. Associated it with intermittent productive cough. Home albuterol treatments with only remittent mild improvement. Nothing made his symptoms worse. Nothing made them better.    Hospital Course:  Acute Hypoxemic Respiratory Failure -Secondary to pleural effusion and lung mass. Improving at discharge but desaturates with ambulation. S/p thoracentesis with removal of 1650 ml fluid. Await cytology. Will discharge with 24/7 oxygen as well as nebulizers and prednisone taper and 3 days doxy to complete 7 day course. Will follow up with oncology as scheduled after OP PET scan which is scheduled as well.   Lung Mass -Concerning for malignancy. Quit smoking in 1999 but was a smoker for 57 years. Evaluated by oncology who opine suspicious for primary bronchogenic carcinoma but awaiting cytology report. Recommending CT of the abdomen pelvis before discharge and then I have  arranged for outpatient PET scan.  Pleural Effusion -s/P US-guided thoracentesis. Cytology pending to be followed by oncology  A Fib -remained rate controlled.  BPH - stable at baseline.  GERD - stable at baseline  Peripheral Neuropathy - stable at baseline.   Rheumatoid Arthritis -Continue MTX, enbrel  CKD-III. Stable.  Procedures:  Thoracentisis 08/24/15  Consultations:  oncology  Discharge Exam: Danley Danker Vitals:   08/25/15 1036  BP:   Pulse: 94  Temp:   Resp:     General: well nourished appears comfortable Cardiovascular: irregularly irregular Respiratory: normal effort   Discharge Instructions   Current Discharge Medication List    START taking these medications   Details  doxycycline (VIBRA-TABS) 100 MG tablet Take 1 tablet (100 mg total) by mouth every 12 (twelve) hours. Qty: 6 tablet, Refills: 0    predniSONE (DELTASONE) 10 MG tablet Take 4 tabs for 1 day starting 08/26/15 then take 3 tabs for 1 day then take 2 tabs for 1 day then take 1 tab for 1 day then stop. Qty: 10 tablet, Refills: 0      CONTINUE these medications which have CHANGED   Details  digoxin (LANOXIN) 0.125 MG tablet Take 1 tablet (0.125 mg total) by mouth daily. Marland Kitchen    gabapentin (NEURONTIN) 300 MG capsule Take 2 capsules (600 mg total) by mouth 2 (two) times daily. Qty: 30 capsule, Refills: 0    potassium chloride SA (K-DUR,KLOR-CON) 20 MEQ tablet Take 0.5 tablets (10 mEq total) by mouth daily. Starting 08/27/15.      CONTINUE these medications which have NOT CHANGED   Details  albuterol (PROAIR HFA) 108 (90  BASE) MCG/ACT inhaler Inhale 2 puffs into the lungs every 6 (six) hours as needed. Shortness of Breath    albuterol (PROVENTIL) (2.5 MG/3ML) 0.083% nebulizer solution Take 2.5 mg by nebulization every 6 (six) hours as needed. Shortness of Breath    cetirizine (ZYRTEC) 10 MG tablet Take 10 mg by mouth daily.      diltiazem (CARDIZEM CD) 360 MG 24 hr capsule Take 1 capsule  (360 mg total) by mouth daily. Qty: 30 capsule, Refills: 3    Etanercept (ENBREL) 25 MG/0.5ML SOSY Inject into the skin 2 (two) times a week.     ferrous sulfate 325 (65 FE) MG tablet Take 325 mg by mouth daily.    folic acid (FOLVITE) 1 MG tablet Take 1 mg by mouth daily.     furosemide (LASIX) 40 MG tablet Take 0.5 tablets (20 mg total) by mouth daily. Take daily for prevention of fluid buildup.    ketoconazole (NIZORAL) 2 % shampoo Apply 1 application topically 3 (three) times a week.     methotrexate (RHEUMATREX) 2.5 MG tablet Take 7.5 mg by mouth 2 (two) times a week. *TAKE 3 TABLETS BY MOUTH TWICE WEEKLY ON FRIDAYS AND SATURDAYS*    mometasone (NASONEX) 50 MCG/ACT nasal spray Place 2 sprays into the nose daily.    Omega-3 Fatty Acids (FISH OIL) 1000 MG CAPS Take 2 capsules by mouth 2 (two) times daily.     omeprazole (PRILOSEC) 40 MG capsule Take 40 mg by mouth daily.    oxyCODONE-acetaminophen (PERCOCET) 10-325 MG per tablet Take 1 tablet by mouth every 6 (six) hours as needed for pain.    tadalafil (CIALIS) 5 MG tablet Take 5 mg by mouth daily.    tamsulosin (FLOMAX) 0.4 MG CAPS capsule Take 1 capsule (0.4 mg total) by mouth at bedtime. For prostate treatment. Qty: 30 capsule, Refills: 3    Vitamin D, Ergocalciferol, (DRISDOL) 50000 UNITS CAPS capsule Take 1 capsule by mouth once a week. Refills: 4      STOP taking these medications     benzonatate (TESSALON) 100 MG capsule        Allergies  Allergen Reactions  . Cymbalta [Duloxetine Hcl] Other (See Comments)    Confusion   . Procaine Hcl Hives and Other (See Comments)    NOVOCAINE: Sweating, Confusion, Not in right state of mind.  [name]    unknown   Follow-up Information    Follow up with [facility] On 09/04/2015.   Why:  at 10:20 am (Temporarily located on the 2nd floor)   Contact information:   [address] [place]  [address]       Follow up with [facility] On 09/03/2015.   Why:  at 1:30 pm.  Go to the main entrance  for PET Scan. Do not eat or drink after midnight.   Contact information:   [place] [id number] [phone]       The results of significant diagnostics from this hospitalization (including imaging, microbiology, ancillary and laboratory) are listed below for reference.    Significant Diagnostic Studies: Dg Chest 1 View  08/24/2015   CLINICAL DATA:  81 year old male with right pleural effusions status post thoracentesis.  EXAM: CHEST  1 VIEW  COMPARISON:  Chest x-ray 07/15/2015.  FINDINGS: Lung volumes are low. There are bibasilar opacities that reflect a combination of areas of chronic scarring and probable interstitial lung disease.  Previously noted large right pleural effusion has significantly decreased in size, now small to moderate. No pneumothorax. No acute consolidative airspace disease. Cephalization of the pulmonary vasculature, without frank pulmonary edema. Heart size is normal. Widening of mediastinal contours, compatible with known lymphadenopathy. Atherosclerosis in the thoracic aorta.  IMPRESSION: 1. Decreased size of right-sided pleural effusion following thoracentesis. No pneumothorax or other acute complicating features. 2. The appearance of the lungs again suggests underlying interstitial lung disease. 3. Mediastinal and bilateral hilar lymphadenopathy again noted.   Electronically Signed   By: Vinnie Langton M.D.   On: 08/24/2015 15:48   Dg Chest 2 View  08/22/2015   CLINICAL DATA:  Short of breath. Atrial fibrillation with tachycardia.  EXAM: CHEST  2 VIEW  COMPARISON:  05/22/2015  FINDINGS: Moderate hyperinflation. An upper thoracic a moderate to severe compression deformity is chronic. There also lower thoracic compression deformities which are felt to be similar.  Midline trachea. Normal heart size. Atherosclerosis  in the transverse aorta. Right pleural effusion is increased. No pneumothorax. Interstitial thickening is progressive and lower lobe predominant. Mild left base atelectasis. Worsened right base airspace disease.  IMPRESSION: Mild interstitial edema, superimposed upon marked COPD/chronic bronchitis.  Increasing right pleural effusion with adjacent atelectasis or infection. Followup PA and lateral chest X-ray is recommended in 3-4 weeks following trial of antibiotic therapy to ensure resolution and exclude underlying malignancy.  Thoracolumbar compression deformities, suboptimally evaluated.   Electronically Signed   By: Abigail Miyamoto M.D.   On: 08/22/2015 15:00   Ct Angio Chest Pe W/cm &/or Wo Cm  08/22/2015   CLINICAL DATA:  Increasing shortness of breath since last night.  EXAM: CT ANGIOGRAPHY CHEST WITH CONTRAST  TECHNIQUE: Multidetector CT imaging of the chest was performed using the standard protocol during bolus administration of intravenous contrast. Multiplanar CT image reconstructions and MIPs were obtained to evaluate the vascular anatomy.  CONTRAST:  171mL OMNIPAQUE IOHEXOL 350 MG/ML SOLN  COMPARISON:  06/13/2013 and 03/29/2011 as well as chest x-ray 05/19/2015  FINDINGS: Lungs are adequately inflated and demonstrate a moderate size right pleural effusion and small left pleural effusion which are new. There is associated compressive atelectasis over the right middle lobe and right lower lobes. There is mild diffuse paraseptal emphysematous disease with patchy increased bilateral interstitial markings and mild fibrotic change. There is a 9 mm nodule over the lingula which is not significantly changed from 03/29/2011. Increase in size of a 7 mm nodule slightly more superiorly over the lingula. No new nodules identified. Minimal narrowing of the right middle lobe and lower lobe bronchi due to adjacent adenopathy/mass.  Heart is normal size. There is calcification over the left anterior descending, lateral  circumflex and right coronary arteries. There is calcification over the mitral valve annulus. There is no evidence of pulmonary embolism. There is calcified plaque over the thoracic aorta. There is worsening mediastinal and bilateral hilar adenopathy with the largest node over the subcarinal region measuring 2.7 cm by short axis. There is worsening soft tissue density in the right infrahilar region which may represent adenopathy versus central lung mass measuring 2.8 x 4.1 cm. Several small nodes are present over the neck base bilaterally. There are a couple small sub cm bilateral pericardial phrenic lymph nodes.  Images through the upper abdomen demonstrate several small hypodensities over the left lobe of the liver unchanged from 2012 and likely cysts. There is a nodular contour to the liver unchanged. Mild worsening splenomegaly. There are a few small sub cm  lymph nodes in the gastrohepatic ligament. There is thickening of the right corona likely due to mild adjacent retrocrural adenopathy. Bilateral renal cysts are present.  There are degenerative changes of the spine. There is a moderate compression fracture over the upper thoracic spine unchanged as well as mild loss of height of several lower thoracic vertebral bodies unchanged.  Review of the MIP images confirms the above findings.  IMPRESSION: No evidence of pulmonary embolism.  Right infrahilar lymph node versus central lung mass measuring 2.8 x 4.1 cm. Worsening mediastinal adenopathy and bilateral hilar adenopathy as described with the largest node over the subcarinal region measuring 2.7 cm by short axis. Small nodes over the pericardial phrenic region, neck base and upper abdomen as described. New moderate size right pleural effusion and small left pleural effusion with associated atelectasis over the right middle lobe and right lower lobe. 9 mm nodule over the lingula stable since 2012 with slight increase in size of a 7 mm nodule also over the  lingula. Findings concerning for central right lung malignancy with metastatic disease. Recommend PET-CT for further evaluation.  Paraseptal emphysematous change with patchy fibrotic changes showing interval progression.  Stable spinal compression fractures described.  Nodular contour of the liver with mild splenomegaly as findings may be due to cirrhosis. Several small stable hypodensities over the left lobe of the liver likely cysts.  Bilateral renal cysts.  Atherosclerotic coronary artery disease.   Electronically Signed   By: Marin Olp M.D.   On: 08/22/2015 17:30   US Thoracentesis Asp Pleural Space W/img Guide  08/24/2015   CLINICAL DATA:  81 year old male with shortness of breath. Large right pleural effusion.  EXAM: ULTRASOUND GUIDED RIGHT-SIDED THORACENTESIS  COMPARISON:  Chest CT 08/22/2015.  PROCEDURE: An ultrasound guided thoracentesis was thoroughly discussed with the patient and questions answered. The benefits, risks, alternatives and complications were also discussed. The patient understands and wishes to proceed with the procedure. Written consent was obtained.  Ultrasound was performed to localize and mark an adequate pocket of fluid in the right chest. The area was then prepped and draped in the normal sterile fashion. 1% Lidocaine was used for local anesthesia. Under ultrasound guidance a 19 gauge Yueh catheter was introduced. Thoracentesis was performed. The catheter was removed and a dressing applied.  Complications:  None.  FINDINGS: A total of approximately 1,650 mL of serosanguineous fluid was removed. A fluid sample was sent for laboratory analysis.  IMPRESSION: Successful ultrasound guided right-sided thoracentesis yielding 1,650 mL of pleural fluid.   Electronically Signed   By: Vinnie Langton M.D.   On: 08/24/2015 16:08    Microbiology: Recent Results (from the past 240 hour(s))  MRSA PCR Screening     Status: Abnormal   Collection Time: 08/23/15  9:11 AM  Result Value Ref  Range Status   MRSA by PCR POSITIVE (A) NEGATIVE Final    Comment:        The GeneXpert MRSA Assay (FDA approved for NASAL specimens only), is one component of a comprehensive MRSA colonization surveillance program. It is not intended to diagnose MRSA infection nor to guide or monitor treatment for MRSA infections. RESULT CALLED TO, READ BACK BY AND VERIFIED WITH: BULLINS,M AT 1118 ON 08/23/15 BY MOSLEY,J   Culture, body fluid-bottle     Status: None (Preliminary result)   Collection Time: 08/24/15  3:05 PM  Result Value Ref Range Status   Specimen Description FLUID RIGHT PLEURAL  Final   Special Requests BOTTLES DRAWN AEROBIC AND  ANAEROBIC 10CC EACH  Final   Culture NO GROWTH < 24 HOURS  Final   Report Status PENDING  Incomplete  Gram stain     Status: None   Collection Time: 08/24/15  3:05 PM  Result Value Ref Range Status   Specimen Description FLUID RIGHT PLEURAL  Final   Special Requests NONE  Final   Gram Stain   Final    CYTOSPIN SMEAR WBC PRESENT,BOTH PMN AND MONONUCLEAR NO ORGANISMS SEEN Performed at [facility]    Report Status 08/24/2015 FINAL  Final     Labs: Basic Metabolic Panel:  Recent Labs Lab 08/22/15 1421 08/23/15 0550 08/25/15 0901  NA 137 138 131*  K 4.4 4.8 5.6*  CL 103 102 98*  CO2 25 26 27   GLUCOSE 82 166* 148*  BUN 11 14 41*  CREATININE 1.03 1.29* 1.96*  CALCIUM 9.1 8.9 9.1   Liver Function Tests:  Recent Labs Lab 08/22/15 1421  AST 48*  ALT 33  ALKPHOS 55  BILITOT 2.4*  PROT 7.4  ALBUMIN 3.5   No results for input(s): LIPASE, AMYLASE in the last 168 hours. No results for input(s): AMMONIA in the last 168 hours. CBC:  Recent Labs Lab 08/22/15 1421 08/23/15 0550  WBC 6.6 3.5*  NEUTROABS 4.5  --   HGB 13.8 13.0  HCT 40.7 39.3  MCV 101.2* 101.8*  PLT 98* 105*   Cardiac Enzymes:  Recent Labs Lab 08/22/15 1421  TROPONINI <0.03   BNP: BNP (last 3 results)  Recent Labs  05/19/15 1545 05/20/15 0533  08/22/15 1421  BNP 413.0* 382.0* 358.0*    ProBNP (last 3 results) No results for input(s): PROBNP in the last 8760 hours.  CBG: No results for input(s): GLUCAP in the last 168 hours.     SignedRadene Gunning  [facility] 08/25/2015, 11:46 AM

## 2015-08-25 NOTE — Progress Notes (Signed)
SATURATION QUALIFICATIONS: (This note is used to comply with regulatory documentation for home oxygen)  Patient Saturations on Room Air at Rest =92%  Patient Saturations on Room Air while Ambulating = 85%  Patient Saturations on 2 Liters of oxygen recovery at rest = 94%

## 2015-08-29 LAB — CULTURE, BODY FLUID-BOTTLE

## 2015-08-29 LAB — CULTURE, BODY FLUID W GRAM STAIN -BOTTLE: Culture: NO GROWTH

## 2015-09-03 ENCOUNTER — Ambulatory Visit (HOSPITAL_COMMUNITY)
Admit: 2015-09-03 | Discharge: 2015-09-03 | Disposition: A | Discharge status: Home / Self Care | Payer: Medicare Other | Source: Ambulatory Visit | Attending: Oncology | Admitting: Oncology

## 2015-09-03 DIAGNOSIS — C778 Secondary and unspecified malignant neoplasm of lymph nodes of multiple regions: Secondary | ICD-10-CM | POA: Diagnosis not present

## 2015-09-03 DIAGNOSIS — R933 Abnormal findings on diagnostic imaging of other parts of digestive tract: Secondary | ICD-10-CM | POA: Diagnosis not present

## 2015-09-03 DIAGNOSIS — R918 Other nonspecific abnormal finding of lung field: Secondary | ICD-10-CM | POA: Diagnosis present

## 2015-09-03 DIAGNOSIS — C801 Malignant (primary) neoplasm, unspecified: Secondary | ICD-10-CM | POA: Insufficient documentation

## 2015-09-03 DIAGNOSIS — J90 Pleural effusion, not elsewhere classified: Secondary | ICD-10-CM | POA: Diagnosis not present

## 2015-09-03 LAB — GLUCOSE, CAPILLARY: GLUCOSE-CAPILLARY: 87 mg/dL (ref 65–99)

## 2015-09-04 ENCOUNTER — Encounter (HOSPITAL_COMMUNITY): Payer: Medicare Other | Attending: Oncology | Admitting: Oncology

## 2015-09-04 VITALS — BP 107/66 | HR 67 | Temp 98.0°F | Resp 20 | Wt 202.0 lb

## 2015-09-04 DIAGNOSIS — C8330 Diffuse large B-cell lymphoma, unspecified site: Secondary | ICD-10-CM | POA: Diagnosis not present

## 2015-09-04 DIAGNOSIS — R790 Abnormal level of blood mineral: Secondary | ICD-10-CM | POA: Diagnosis not present

## 2015-09-04 DIAGNOSIS — R918 Other nonspecific abnormal finding of lung field: Secondary | ICD-10-CM | POA: Diagnosis not present

## 2015-09-04 DIAGNOSIS — R760 Raised antibody titer: Secondary | ICD-10-CM

## 2015-09-04 LAB — CBC WITH DIFFERENTIAL/PLATELET
Basophils Absolute: 0 10*3/uL (ref 0.0–0.1)
Basophils Relative: 0 % (ref 0–1)
EOS ABS: 0.2 10*3/uL (ref 0.0–0.7)
EOS PCT: 3 % (ref 0–5)
HCT: 39.2 % (ref 39.0–52.0)
Hemoglobin: 13.4 g/dL (ref 13.0–17.0)
LYMPHS ABS: 0.7 10*3/uL (ref 0.7–4.0)
Lymphocytes Relative: 11 % — ABNORMAL LOW (ref 12–46)
MCH: 35.4 pg — AB (ref 26.0–34.0)
MCHC: 34.2 g/dL (ref 30.0–36.0)
MCV: 103.7 fL — ABNORMAL HIGH (ref 78.0–100.0)
MONOS PCT: 5 % (ref 3–12)
Monocytes Absolute: 0.4 10*3/uL (ref 0.1–1.0)
Neutro Abs: 5.5 10*3/uL (ref 1.7–7.7)
Neutrophils Relative %: 81 % — ABNORMAL HIGH (ref 43–77)
PLATELETS: 120 10*3/uL — AB (ref 150–400)
RBC: 3.78 MIL/uL — ABNORMAL LOW (ref 4.22–5.81)
RDW: 17.3 % — ABNORMAL HIGH (ref 11.5–15.5)
WBC: 6.7 10*3/uL (ref 4.0–10.5)

## 2015-09-04 LAB — COMPREHENSIVE METABOLIC PANEL
ALT: 33 U/L (ref 17–63)
ANION GAP: 3 — AB (ref 5–15)
AST: 34 U/L (ref 15–41)
Albumin: 3.2 g/dL — ABNORMAL LOW (ref 3.5–5.0)
Alkaline Phosphatase: 61 U/L (ref 38–126)
BUN: 22 mg/dL — ABNORMAL HIGH (ref 6–20)
CHLORIDE: 103 mmol/L (ref 101–111)
CO2: 30 mmol/L (ref 22–32)
Calcium: 8.6 mg/dL — ABNORMAL LOW (ref 8.9–10.3)
Creatinine, Ser: 1.2 mg/dL (ref 0.61–1.24)
GFR calc non Af Amer: 54 mL/min — ABNORMAL LOW (ref >60)
Glucose, Bld: 82 mg/dL (ref 65–99)
Potassium: 4.9 mmol/L (ref 3.5–5.1)
SODIUM: 136 mmol/L (ref 135–145)
Total Bilirubin: 1.1 mg/dL (ref 0.3–1.2)
Total Protein: 6.6 g/dL (ref 6.5–8.1)

## 2015-09-04 LAB — URIC ACID: URIC ACID, SERUM: 6.8 mg/dL (ref 4.4–7.6)

## 2015-09-04 LAB — LACTATE DEHYDROGENASE: LDH: 217 U/L — AB (ref 98–192)

## 2015-09-04 LAB — PHOSPHORUS: Phosphorus: 3.7 mg/dL (ref 2.5–4.6)

## 2015-09-04 LAB — C-REACTIVE PROTEIN

## 2015-09-04 LAB — SEDIMENTATION RATE: Sed Rate: 9 mm/hr (ref 0–16)

## 2015-09-04 NOTE — Progress Notes (Addendum)
Tony Mustache, MD [address] [place] [id number]  Lung mass - Plan: CBC with Differential, Comprehensive metabolic panel, Sedimentation rate, C-reactive protein, Uric acid, Phosphorus, Hepatitis B surface antigen, Hepatitis B core antibody, IgM, Lactate dehydrogenase, Beta 2 microglobuline, serum, CBC with Differential, Comprehensive metabolic panel, Sedimentation rate, C-reactive protein, Uric acid, Phosphorus, Hepatitis B surface antigen, Hepatitis B core antibody, IgM, Lactate dehydrogenase, Beta 2 microglobuline, serum  Lupus anticoagulant positive - Plan: Lupus anticoagulant panel, Lupus anticoagulant panel, Lupus anticoagulant panel  CURRENT THERAPY: Work-up for diagnosis  INTERVAL HISTORY: [name] 81 y.o. male comes in for additional discussion of an abnormal pleural effusion suggesting a lymphoproliferative process. I was out of town at the time of initial inpatient consult. The patient was visited by Kirby Crigler our physician assistant, the patient was discussed over the phone with me and we arranged for new patient appointment today.  I personally reviewed and went over pathology results with the patient.  Thoracentesis performed while an inpatient demonstrates suspicion for NHL.    I personally reviewed and went over radiographic studies with the patient.  The results are noted within this dictation.  PET imaging demonstrates widespread hypermetabolic lymphadenopathy.  Report printed for the patient.  The patient originally presented to the emergency department at [facility] with shortness of breath. He has known COPD. He had a CTA of the chest during that admission that showed multiple abnormalities including a right infrahilar lymph node versus a central lung mass, mediastinal bilateral hilar adenopathy and a new right pleural effusion. He underwent a thoracentesis with findings suggestive of a lymphoproliferative process.  He is here today post  discharge and post PET CT. He is here to review the results of his PET imaging and for additional recommendations. He notes that for the most part he feels well. He has some fatigue. He has chronic pulmonary issues but no other major complaints. He has rheumatoid arthritis and although not on his med list he is on Enbrel which she has been on for many years. He is also on methotrexate.  Past Medical History  Diagnosis Date  . COPD (chronic obstructive pulmonary disease)     Uses occasional nighttime O2  . Disseminated herpes zoster 2010  . GERD (gastroesophageal reflux disease)   . Asthma   . Pulmonary embolus     2003 and 2011  . Pulmonary nodules     Chest CT 09/11  . Mixed hyperlipidemia   . Chronic atrial fibrillation   . Lupus anticoagulant positive   . Essential hypertension, benign   . Cholelithiasis   . Rheumatoid arthritis(714.0)   . History of chicken pox 1941; 2011  . Anemia   . Chronic lower back pain   . Chronic diastolic heart failure   . Cirrhosis     Questionable, AFP normal Feb 01/2012, hx of ETOH use   . Headache(784.0)   . Cervical spondylosis without myelopathy   . Stage III chronic kidney disease   . Cellulitis of leg, left 01/03/2015  . Aortic stenosis, moderate 05/21/2015    has Essential hypertension, benign; PULMONARY EMBOLISM; Chronic atrial fibrillation; Rheumatoid arthritis; Lupus anticoagulant positive; GERD (gastroesophageal reflux disease); Cirrhosis; COPD (chronic obstructive pulmonary disease); Long term current use of anticoagulant; Arthritis; Anemia; Adenomatous polyps; Chronic diastolic heart failure; Headache(784.0); Cervical spondylosis without myelopathy; Stiffness of joints, not elsewhere classified, multiple sites; Posture imbalance; Cellulitis of leg, left; Acute kidney injury; Thrombocytopenia; Stage III chronic kidney disease; Acute on  chronic diastolic CHF (congestive heart failure); COPD exacerbation; Aortic stenosis, moderate; Acute  diastolic CHF (congestive heart failure); AKI (acute kidney injury); Acute respiratory distress; Lung mass; Chronic a-fib; BPH (benign prostatic hyperplasia); Pleural effusion; and Atrial fibrillation with RVR on his problem list.     is allergic to cymbalta; procaine hcl; and bystolic.  Current Outpatient Prescriptions on File Prior to Visit  Medication Sig Dispense Refill  . albuterol (PROAIR HFA) 108 (90 BASE) MCG/ACT inhaler Inhale 2 puffs into the lungs every 6 (six) hours as needed. Shortness of Breath 1 Inhaler 3  . albuterol (PROVENTIL) (2.5 MG/3ML) 0.083% nebulizer solution Take 3 mLs (2.5 mg total) by nebulization every 6 (six) hours as needed. Shortness of Breath 75 mL 12  . cetirizine (ZYRTEC) 10 MG tablet Take 10 mg by mouth daily.      . digoxin (LANOXIN) 0.125 MG tablet Take 1 tablet (0.125 mg total) by mouth daily. .    . diltiazem (CARDIZEM CD) 360 MG 24 hr capsule Take 1 capsule (360 mg total) by mouth daily. 30 capsule 3  . Etanercept (ENBREL) 25 MG/0.5ML SOSY Inject into the skin 2 (two) times a week.     . ferrous sulfate 325 (65 FE) MG tablet Take 325 mg by mouth daily.    . folic acid (FOLVITE) 1 MG tablet Take 1 mg by mouth daily.     . furosemide (LASIX) 40 MG tablet Take 0.5 tablets (20 mg total) by mouth daily. Take daily for prevention of fluid buildup.    . gabapentin (NEURONTIN) 300 MG capsule Take 2 capsules (600 mg total) by mouth 2 (two) times daily. 30 capsule 0  . ketoconazole (NIZORAL) 2 % shampoo Apply 1 application topically 3 (three) times a week.     . methotrexate (RHEUMATREX) 2.5 MG tablet Take 7.5 mg by mouth 2 (two) times a week. *TAKE 3 TABLETS BY MOUTH TWICE WEEKLY ON FRIDAYS AND SATURDAYS*    . mometasone (NASONEX) 50 MCG/ACT nasal spray Place 2 sprays into the nose daily.    . Omega-3 Fatty Acids (FISH OIL) 1000 MG CAPS Take 2 capsules by mouth 2 (two) times daily.     Marland Kitchen omeprazole (PRILOSEC) 40 MG capsule Take 40 mg by mouth daily.    Marland Kitchen  oxyCODONE-acetaminophen (PERCOCET) 10-325 MG per tablet Take 1 tablet by mouth every 6 (six) hours as needed for pain.    . potassium chloride SA (K-DUR,KLOR-CON) 20 MEQ tablet Take 0.5 tablets (10 mEq total) by mouth daily. Starting 08/27/15.    . tadalafil (CIALIS) 5 MG tablet Take 5 mg by mouth daily.    . tamsulosin (FLOMAX) 0.4 MG CAPS capsule Take 1 capsule (0.4 mg total) by mouth at bedtime. For prostate treatment. 30 capsule 3  . Vitamin D, Ergocalciferol, (DRISDOL) 50000 UNITS CAPS capsule Take 1 capsule by mouth once a week.  4   No current facility-administered medications on file prior to visit.    Past Surgical History  Procedure Laterality Date  . Polypectomy  02/02/2012    RMR: Multiple colonic polyps removed, flat, tubular adenomas/ Left-sided diverticulosis  . Posterior lumbar vertebrae excision  2003; 2009; 2011  . Myringotomy      "3 times; both ears"  . Cataract extraction w/ intraocular lens  implant, bilateral    . Cholecystectomy  05/23/2012    Procedure: LAPAROSCOPIC CHOLECYSTECTOMY WITH INTRAOPERATIVE CHOLANGIOGRAM;  Surgeon: Stark Klein, MD;  Location: [facility];  Service: General;  Laterality: N/A;  . Esophagogastroduodenoscopy  02/02/12  ZDG:LOVFIEPP size hiatal hernia; otherwise normal exam  . Colonoscopy  01/24/2013    IRJ:JOACZYS polyps/colonic diverticulosis  . Back surgery      Denies any headaches, dizziness, double vision, fevers, chills, night sweats, nausea, vomiting, diarrhea, constipation, chest pain, heart palpitations, shortness of breath, blood in stool, black tarry stool, urinary pain, urinary burning, urinary frequency, hematuria. 14 point review of systems was performed and is negative except as detailed under history of present illness and above   PHYSICAL EXAMINATION  ECOG PERFORMANCE STATUS: 1 - Symptomatic but completely ambulatory  Filed Vitals:   09/04/15 1022  BP: 107/66  Pulse: 67  Temp: 98 F (36.7 C)  Resp: 20    GENERAL:alert,  no distress, well nourished, well developed, comfortable, cooperative, smiling and accompanied by daughter. SKIN: skin color, texture, turgor are normal, no rashes or significant lesions HEAD: Normocephalic, No masses, lesions, tenderness or abnormalities EYES: normal, PERRLA, EOMI, Conjunctiva are pink and non-injected EARS: External ears normal OROPHARYNX:lips, buccal mucosa, and tongue normal and mucous membranes are moist  NECK: supple, thyroid normal size, non-tender, without nodularity, no stridor, non-tender, trachea midline LYMPH:  Right lower anterior neck thickening at site of adenopathy, axillary adenopathy on right not palpable. BREAST:not examined LUNGS: right lower lobe decreased breath sounds, otherwise clear. HEART: irregularly irregular rate & rhythm, no murmurs and no gallops ABDOMEN:abdomen soft and normal bowel sounds BACK: Back symmetric, no curvature. EXTREMITIES:less then 2 second capillary refill, no joint deformities, effusion, or inflammation, no skin discoloration, no cyanosis  NEURO: alert & oriented x 3 with fluent speech, no focal motor/sensory deficits, gait normal   LABORATORY DATA: CBC    Component Value Date/Time   WBC 6.7 09/04/2015 1219   RBC 3.78* 09/04/2015 1219   HGB 13.4 09/04/2015 1219   HCT 39.2 09/04/2015 1219   HCT 34 01/11/2012 1007   PLT 120* 09/04/2015 1219   MCV 103.7* 09/04/2015 1219   MCV 92.0 01/11/2012 1007   MCH 35.4* 09/04/2015 1219   MCHC 34.2 09/04/2015 1219   RDW 17.3* 09/04/2015 1219   LYMPHSABS 0.7 09/04/2015 1219   MONOABS 0.4 09/04/2015 1219   EOSABS 0.2 09/04/2015 1219   BASOSABS 0.0 09/04/2015 1219      Chemistry      Component Value Date/Time   NA 136 09/04/2015 1219   NA 140 01/11/2012 1005   K 4.9 09/04/2015 1219   K 4.5 01/11/2012 1005   CL 103 09/04/2015 1219   CO2 30 09/04/2015 1219   BUN 22* 09/04/2015 1219   BUN 12 01/11/2012 1005   CREATININE 1.20 09/04/2015 1219   CREATININE 1.17 01/11/2012 1005       Component Value Date/Time   CALCIUM 8.6* 09/04/2015 1219   CALCIUM 9.2 01/11/2012 1005   ALKPHOS 61 09/04/2015 1219   AST 34 09/04/2015 1219   ALT 33 09/04/2015 1219   BILITOT 1.1 09/04/2015 1219       RADIOGRAPHIC STUDIES:  Ct Abdomen Pelvis Wo Contrast  08/25/2015   CLINICAL DATA:  81 year old male with central right lower lobe mass and mediastinal and upper retroperitoneal lymphadenopathy on recent chest CT angiogram. Status post interval right thoracentesis.  EXAM: CT ABDOMEN AND PELVIS WITHOUT CONTRAST  TECHNIQUE: Multidetector CT imaging of the abdomen and pelvis was performed following the standard protocol without IV contrast.  COMPARISON:  08/22/2015 chest CT angiogram. 07/05/2013 CT abdomen/pelvis.  FINDINGS: Images are partially motion degraded.  Lower chest: Small right pleural effusion, decreased since 08/22/2015. Re- demonstrated are right coronary  artery calcifications and aortic valvular and mitral annular calcifications. Partially visualized is the 4.0 cm central right lower lobe lung mass (series 2/image 2). Partially visualized is the subcarinal lymphadenopathy. Re- demonstrated is volume loss and patchy consolidation in the basilar right lower lobe, representing atelectasis and/or postobstructive pneumonia. Re- demonstrated is subpleural reticulation with associated ground-glass opacity and microcystic changes throughout the visualized lung bases, in keeping with an underlying interstitial lung disease which is incompletely evaluated on this study. Oral contrast is present in the lower thoracic esophageal lumen, in keeping with esophageal dysmotility and/or gastroesophageal reflux.  Hepatobiliary: There are 3 small simple cysts in the left liver lobe, largest 2.0 cm, as characterized on the 04/12/2012 MRI study. There are no new liver lesions. The liver surface is diffusely irregular, in keeping with cirrhosis. Status post cholecystectomy. No intrahepatic or extrahepatic  biliary ductal dilatation.  Pancreas: Normal.  Spleen: Normal size spleen (craniocaudal splenic length 11.3 cm). Punctate granulomatous calcifications throughout the spleen. No splenic mass.  Adrenals/Urinary Tract: Normal adrenals. Bilateral simple renal cysts, largest 2.3 cm in the lateral lower right kidney and 2.4 cm in the upper left kidney. No hydronephrosis. No nephrolithiasis. Normal caliber ureters. Normal urinary bladder.  Stomach/Bowel: Normal stomach. Normal caliber small bowel. The appendix is not discretely visualized. Mild diverticulosis of the descending and sigmoid colon. No large bowel wall thickening or pericolonic fat stranding.  Vascular/Lymphatic: Atherosclerotic nonaneurysmal abdominal aorta. Top-normal size 1.2 cm short axis right inguinal lymph node. Mildly enlarged 1.1 cm right retrocrural lymph node (series 2/image 21). Multiple mildly prominent gastrohepatic ligament lymph nodes, largest 0.9 cm short axis (2/21).  Reproductive: Mild prostatomegaly, unchanged.  Other: No pneumoperitoneum, ascites or focal fluid collection.  Musculoskeletal: Status post bilateral posterior lumbar spine fusion at L4-5. Marked degenerative changes throughout the visualized thoracolumbar spine. No aggressive appearing focal osseous lesions.  IMPRESSION: 1. Mild upper retroperitoneal (gastrohepatic ligament and right retrocrural) lymphadenopathy. 2. Cirrhosis. No suspicious liver lesions on this noncontrast study. No ascites. Normal size spleen. 3. No acute abnormality in the abdomen or pelvis. No evidence of bowel obstruction or acute bowel inflammation. 4. Partially visualized central right lower lobe lung mass and subcarinal lymphadenopathy, suspicious for primary lung neoplasm and metastatic adenopathy. 5. Small right pleural effusion, decreased. Persistent patchy consolidation and volume loss in the right lower lobe, in keeping with atelectasis and/or postobstructive pneumonia. 6. Underlying interstitial  lung disease in the visualized lung bases, incompletely characterized on this study.   Electronically Signed   By: Ilona Sorrel M.D.   On: 08/25/2015 13:10   Dg Chest 1 View  08/24/2015   CLINICAL DATA:  81 year old male with right pleural effusions status post thoracentesis.  EXAM: CHEST  1 VIEW  COMPARISON:  Chest x-ray 07/15/2015.  FINDINGS: Lung volumes are low. There are bibasilar opacities that reflect a combination of areas of chronic scarring and probable interstitial lung disease. Previously noted large right pleural effusion has significantly decreased in size, now small to moderate. No pneumothorax. No acute consolidative airspace disease. Cephalization of the pulmonary vasculature, without frank pulmonary edema. Heart size is normal. Widening of mediastinal contours, compatible with known lymphadenopathy. Atherosclerosis in the thoracic aorta.  IMPRESSION: 1. Decreased size of right-sided pleural effusion following thoracentesis. No pneumothorax or other acute complicating features. 2. The appearance of the lungs again suggests underlying interstitial lung disease. 3. Mediastinal and bilateral hilar lymphadenopathy again noted.   Electronically Signed   By: Vinnie Langton M.D.   On: 08/24/2015 15:48  Dg Chest 2 View  08/22/2015   CLINICAL DATA:  Short of breath. Atrial fibrillation with tachycardia.  EXAM: CHEST  2 VIEW  COMPARISON:  05/22/2015  FINDINGS: Moderate hyperinflation. An upper thoracic a moderate to severe compression deformity is chronic. There also lower thoracic compression deformities which are felt to be similar.  Midline trachea. Normal heart size. Atherosclerosis in the transverse aorta. Right pleural effusion is increased. No pneumothorax. Interstitial thickening is progressive and lower lobe predominant. Mild left base atelectasis. Worsened right base airspace disease.  IMPRESSION: Mild interstitial edema, superimposed upon marked COPD/chronic bronchitis.  Increasing right  pleural effusion with adjacent atelectasis or infection. Followup PA and lateral chest X-ray is recommended in 3-4 weeks following trial of antibiotic therapy to ensure resolution and exclude underlying malignancy.  Thoracolumbar compression deformities, suboptimally evaluated.   Electronically Signed   By: Abigail Miyamoto M.D.   On: 08/22/2015 15:00   Ct Angio Chest Pe W/cm &/or Wo Cm  08/22/2015   CLINICAL DATA:  Increasing shortness of breath since last night.  EXAM: CT ANGIOGRAPHY CHEST WITH CONTRAST  TECHNIQUE: Multidetector CT imaging of the chest was performed using the standard protocol during bolus administration of intravenous contrast. Multiplanar CT image reconstructions and MIPs were obtained to evaluate the vascular anatomy.  CONTRAST:  115m OMNIPAQUE IOHEXOL 350 MG/ML SOLN  COMPARISON:  06/13/2013 and 03/29/2011 as well as chest x-ray 05/19/2015  FINDINGS: Lungs are adequately inflated and demonstrate a moderate size right pleural effusion and small left pleural effusion which are new. There is associated compressive atelectasis over the right middle lobe and right lower lobes. There is mild diffuse paraseptal emphysematous disease with patchy increased bilateral interstitial markings and mild fibrotic change. There is a 9 mm nodule over the lingula which is not significantly changed from 03/29/2011. Increase in size of a 7 mm nodule slightly more superiorly over the lingula. No new nodules identified. Minimal narrowing of the right middle lobe and lower lobe bronchi due to adjacent adenopathy/mass.  Heart is normal size. There is calcification over the left anterior descending, lateral circumflex and right coronary arteries. There is calcification over the mitral valve annulus. There is no evidence of pulmonary embolism. There is calcified plaque over the thoracic aorta. There is worsening mediastinal and bilateral hilar adenopathy with the largest node over the subcarinal region measuring 2.7 cm by  short axis. There is worsening soft tissue density in the right infrahilar region which may represent adenopathy versus central lung mass measuring 2.8 x 4.1 cm. Several small nodes are present over the neck base bilaterally. There are a couple small sub cm bilateral pericardial phrenic lymph nodes.  Images through the upper abdomen demonstrate several small hypodensities over the left lobe of the liver unchanged from 2012 and likely cysts. There is a nodular contour to the liver unchanged. Mild worsening splenomegaly. There are a few small sub cm lymph nodes in the gastrohepatic ligament. There is thickening of the right corona likely due to mild adjacent retrocrural adenopathy. Bilateral renal cysts are present.  There are degenerative changes of the spine. There is a moderate compression fracture over the upper thoracic spine unchanged as well as mild loss of height of several lower thoracic vertebral bodies unchanged.  Review of the MIP images confirms the above findings.  IMPRESSION: No evidence of pulmonary embolism.  Right infrahilar lymph node versus central lung mass measuring 2.8 x 4.1 cm. Worsening mediastinal adenopathy and bilateral hilar adenopathy as described with the largest node over  the subcarinal region measuring 2.7 cm by short axis. Small nodes over the pericardial phrenic region, neck base and upper abdomen as described. New moderate size right pleural effusion and small left pleural effusion with associated atelectasis over the right middle lobe and right lower lobe. 9 mm nodule over the lingula stable since 2012 with slight increase in size of a 7 mm nodule also over the lingula. Findings concerning for central right lung malignancy with metastatic disease. Recommend PET-CT for further evaluation.  Paraseptal emphysematous change with patchy fibrotic changes showing interval progression.  Stable spinal compression fractures described.  Nodular contour of the liver with mild splenomegaly as  findings may be due to cirrhosis. Several small stable hypodensities over the left lobe of the liver likely cysts.  Bilateral renal cysts.  Atherosclerotic coronary artery disease.   Electronically Signed   By: Marin Olp M.D.   On: 08/22/2015 17:30   Nm Pet Image Initial (pi) Skull Base To Thigh  09/03/2015   CLINICAL DATA:  Initial treatment strategy for lung carcinoma. RIGHT lobe mass on CT. Adenopathy.  EXAM: NUCLEAR MEDICINE PET SKULL BASE TO THIGH  TECHNIQUE: 10.2 mCi F-18 FDG was injected intravenously. Full-ring PET imaging was performed from the skull base to thigh after the radiotracer. CT data was obtained and used for attenuation correction and anatomic localization.  FASTING BLOOD GLUCOSE:  Value: 87 mg/dl  COMPARISON:  CT 08/22/2015  FINDINGS: NECK  Several intensely hypermetabolic RIGHT level 2 and level 3 lymph nodes are minimally enlarged but intensely metabolic with SUV max equal 8.5.  CHEST  Intensely hypermetabolic prevascular and paratracheal, subcarinal bilateral hilar lymph nodes. Example subcarinal lymph node with SUV max equal 33.  Hypermetabolic LEFT axillary lymph nodes and RIGHT supraclavicular nodes are small but intensely metabolic.  There is hypermetabolic peripheral pleural thickening as well as discrete nodules within both lungs. Hypermetabolic nodule in the LEFT upper lobe measures 6 mm on image 81, series 4. Hypermetabolic RIGHT infrahilar mass measures 2.5 cm on image 84 intense metabolic activity.  There is a moderate RIGHT pleural effusion.  ABDOMEN/PELVIS  Hypermetabolic activity through the gastric antrum in the pyloric region (image 116 in fused data set )with SUV max equals 6.4.  There is hypermetabolic activity associated small retroperitoneal and pelvic lymph nodes. These lymph nodes are small by intensely hypermetabolic. For example LEFT external iliac lymph node on image 172 measures only 7 mm with SUV max equal 13. There are bilateral hypermetabolic inguinal lymph  nodes.  SKELETON  No focal hypermetabolic activity to suggest skeletal metastasis.  IMPRESSION: 1. Widespread metastatic adenopathy including the RIGHT cervical lymph nodes, LEFT axillary lymph nodes, mediastinal lymph, hilar lymph node, retroperitoneal lymph nodes and iliac lymph nodes and inguinal lymph nodes. Inguinal lymph nodes may be most accessible for biopsy. 2. Hypermetabolic pleural thickening and discrete nodules within the lungs consistent with metastatic disease. 3. Potential hypermetabolic RIGHT infrahilar mass versus adenopathy. 4. Hypermetabolic thickening through the gastric antrum / pyloric region. Consider further investigation if a primary cannot be determined. 5. Moderate RIGHT effusion.   Electronically Signed   By: Suzy Bouchard M.D.   On: 09/03/2015 16:34   US Thoracentesis Asp Pleural Space W/img Guide  08/24/2015   CLINICAL DATA:  81 year old male with shortness of breath. Large right pleural effusion.  EXAM: ULTRASOUND GUIDED RIGHT-SIDED THORACENTESIS  COMPARISON:  Chest CT 08/22/2015.  PROCEDURE: An ultrasound guided thoracentesis was thoroughly discussed with the patient and questions answered. The benefits, risks, alternatives and complications  were also discussed. The patient understands and wishes to proceed with the procedure. Written consent was obtained.  Ultrasound was performed to localize and mark an adequate pocket of fluid in the right chest. The area was then prepped and draped in the normal sterile fashion. 1% Lidocaine was used for local anesthesia. Under ultrasound guidance a 19 gauge Yueh catheter was introduced. Thoracentesis was performed. The catheter was removed and a dressing applied.  Complications:  None.  FINDINGS: A total of approximately 1,650 mL of serosanguineous fluid was removed. A fluid sample was sent for laboratory analysis.  IMPRESSION: Successful ultrasound guided right-sided thoracentesis yielding 1,650 mL of pleural fluid.   Electronically  Signed   By: Vinnie Langton M.D.   On: 08/24/2015 16:08     PATHOLOGY:  Diagnosis PLEURAL FLUID, RIGHT(SPECIMEN 1 OF 1 COLLECTED 08/24/15): ATYPICAL LYMPHOCYTES SUSPICIOUS FOR A LYMPHOPROLIFERATIVE PROCESS, SEE COMMENT. [name] Pathologist, Electronic Signature (Case signed 08/28/2015)    ASSESSMENT AND PLAN:  Lung mass Pleural effusion with cytology suggestive of a lymphoproliferative process PET/CT with widespread metastatic adenopathy including the right cervical lymph nodes, left axillary lymph nodes, mediastinal lymph nodes, hilar, retroperitoneal lymph nodes and iliac lymph nodes. Inguinal lymph nodes may be most accessible for biopsy. Hypermetabolic pleural thickening in discrete nodules within the lungs consistent with metastatic disease, right infrahilar mass versus adenopathy, hypermetabolic thickening through the gastric antrum/pyloric region, moderate right effusion   Lung mass identified on imaging during recent hospitalization with right pleural effusion suspicious for malignant pleural effusion from bronchogenic carcinoma.  S/P right thoracentesis for cytology with results reporting suspicion for lymphoproliferative process and ruling out bronchogenic carcinoma.  PET imaging reviewed in detail.  Refer to Gen Surgeon for excision lymph node biopsy and port placement ASAP.  Refer to GI, Dr. Gala Romney, for consideration of EGD to evaluate gastric wall thickening prior to start of therapy.  Labs today: CBC diff, CMET,  LDH, ESR, CRP, B2M, uric acid, phosphorus, hepatitis B core antibody, and hepatitis B surface antigen, and lupus anticoagulant.  Patient education provided regarding lupus anticoagulant that is noted in the chart years ago.  This has been known to be a false positive test in the setting of lymphoproliferative diseases and there is the potential that his malignancy has been smoldering for years resulting in this false test result.  We will update  today.  He reports that he has not picked up his Xarelto that was prescribed by his primary care provider, Dr. Edrick Oh.  We will get copies of the patient records from his primary care provider because it is not clear whether he is anticoagulated from an A-fib standpoint or history of VTE with + lupus anticoagulant, or both.  Reviewing records from Dr. Edrick Oh, it is noted that Vitamin K Antagonist therapy has been difficult to manage in this patient and therefore, he requested consultation from the patient's cardiologist, Dr. Domenic Polite, and it appears that Xarelto was decided upon for his chronic anticoagulation.    He is advised to pick-up his Xarelto Rx, but not to start until advised.   Return in 1.5 weeks for follow-up.  In the interim, he will undergo excisional lymph node biopsy, port placement, and chemotherapy teaching.  If final diagnosis is a NHL, he will need to discontinue ENBREL. We can discuss this at future follow-up.  THERAPY PLAN:  Continued work-up in preparation for diagnosis and treatment.  All questions were answered. The patient knows to call the clinic with any problems, questions or concerns.  We can certainly see the patient much sooner if necessary.  This note is electronically signed PF:[id number] Cyril Mourning, MD  09/04/2015 4:34 PM

## 2015-09-04 NOTE — Progress Notes (Signed)
[name] presented for labwork. Labs per MD order drawn via Peripheral Line 21 gauge needle inserted in left antecubital.  Good blood return present. Procedure without incident.  Needle removed intact. Patient tolerated procedure well.

## 2015-09-04 NOTE — Assessment & Plan Note (Addendum)
Lung mass identified on imaging during recent hospitalization with right pleural effusion suspicious for malignant pleural effusion from bronchogenic carcinoma.  S/P right thoracentesis for cytology with results reporting suspicion for lymphoproliferative process and ruling out bronchogenic carcinoma.  PET imaging reviewed in detail.  Refer to Gen Surgeon for excision lymph node biopsy and port placement ASAP.  Refer to GI, Dr. Gala Romney, for consideration of EGD to evaluate gastric wall thickening prior to start of therapy.  Labs today: CBC diff, CMET,  LDH, ESR, CRP, B2M, uric acid, phosphorus, hepatitis B core antibody, and hepatitis B surface antigen, and lupus anticoagulant.  Patient education provided regarding lupus anticoagulant that is noted in the chart years ago.  This has been known to be a false positive test in the setting of lymphoproliferative diseases and there is the potential that his malignancy has been smoldering for years resulting in this false test result.  We will update today.  He reports that he has not picked up his Xarelto that was prescribed by his primary care provider, Dr. Edrick Oh.  We will get copies of the patient records from his primary care provider because it is not clear whether he is anticoagulated from an A-fib standpoint or history of VTE with + lupus anticoagulant, or both.  Reviewing records from Dr. Edrick Oh, it is noted that Vitamin K Antagonist therapy has been difficult to manage in this patient and therefore, he requested consultation from the patient's cardiologist, Dr. Domenic Polite, and it appears that Xarelto was decided upon for his chronic anticoagulation.    He is advised to pick-up his Xarelto Rx, but not to start until advised.   Return in 1.5 weeks for follow-up.  In the interim, he will undergo excisional lymph node biopsy, port placement, and chemotherapy teaching.

## 2015-09-04 NOTE — Patient Instructions (Signed)
[place] at [facility] Discharge Instructions  RECOMMENDATIONS MADE BY THE CONSULTANT AND ANY TEST RESULTS WILL BE SENT TO YOUR REFERRING PHYSICIAN.  Referral to Dr.Rourk. (gastric thickening) Referral to Dr.Jenkins. (excisional lymph node biopsy and port placement) Tuesday September 13 at 58 Lab work today before leaving. Chemo teaching in 1-2 weeks. Follow up appointment with Dr.Penland in 1-2 weeks. Pick up Xarelto but DONT START.  Thank you for choosing [place] at [facility] to provide your oncology and hematology care.  To afford each patient quality time with our provider, please arrive at least 15 minutes before your scheduled appointment time.    You need to re-schedule your appointment should you arrive 10 or more minutes late.  We strive to give you quality time with our providers, and arriving late affects you and other patients whose appointments are after yours.  Also, if you no show three or more times for appointments you may be dismissed from the clinic at the providers discretion.     Again, thank you for choosing [facility].  Our hope is that these requests will decrease the amount of time that you wait before being seen by our physicians.       _____________________________________________________________  Should you have questions after your visit to [facility], please contact our office at [phone] between the hours of 8:30 a.m. and 4:30 p.m.  Voicemails left after 4:30 p.m. will not be returned until the following business day.  For prescription refill requests, have your pharmacy contact our office.

## 2015-09-05 LAB — HEPATITIS B CORE ANTIBODY, IGM: Hep B C IgM: NEGATIVE

## 2015-09-05 LAB — HEPATITIS B SURFACE ANTIGEN: Hepatitis B Surface Ag: NEGATIVE

## 2015-09-05 LAB — BETA 2 MICROGLOBULIN, SERUM: BETA 2 MICROGLOBULIN: 5.2 mg/L — AB (ref 0.6–2.4)

## 2015-09-07 ENCOUNTER — Other Ambulatory Visit: Payer: Self-pay

## 2015-09-07 ENCOUNTER — Ambulatory Visit (INDEPENDENT_AMBULATORY_CARE_PROVIDER_SITE_OTHER): Payer: Medicare Other | Admitting: Nurse Practitioner

## 2015-09-07 ENCOUNTER — Encounter: Payer: Self-pay | Admitting: Gastroenterology

## 2015-09-07 DIAGNOSIS — R918 Other nonspecific abnormal finding of lung field: Secondary | ICD-10-CM | POA: Diagnosis not present

## 2015-09-07 DIAGNOSIS — D369 Benign neoplasm, unspecified site: Secondary | ICD-10-CM | POA: Diagnosis not present

## 2015-09-07 DIAGNOSIS — K7030 Alcoholic cirrhosis of liver without ascites: Secondary | ICD-10-CM

## 2015-09-07 DIAGNOSIS — Z86010 Personal history of colonic polyps: Secondary | ICD-10-CM

## 2015-09-07 MED ORDER — PEG 3350-KCL-NA BICARB-NACL 420 G PO SOLR: 4000.0000 mL | Freq: Once | ORAL | Status: DC

## 2015-09-07 NOTE — Patient Instructions (Signed)
1. We'll schedule your procedures for you. 2. Return for follow-up in 3 months.

## 2015-09-07 NOTE — Progress Notes (Signed)
Primary Care Physician:  [name], MD Primary Gastroenterologist:  Dr. Gala Romney  Chief Complaint  Patient presents with  . EGD    set up EGD, gastric thickening    HPI:   81 year old male presents on referral from [place], [place] at the oncology center. Patient was diagnosed with likely lung cancer. Oncology notes reviewed. Oncology is requesting upper endoscopy to evaluate for gastric wall thickening prior to starting treatment for cancer. Last colonoscopy 01/24/2013 for heme positive stool and history of multiple black adenomas which was completed under conscious sedation. Findings include diminutive polyp at the base of the cecum, a single 8 mm flat polyp at the splenic flexure, and a single 5 mm flat polyp in the midascending segment. Preparation was inadequate for complete examination with significant viscus colonic effluent and vegetable matter. Also with melanosis coli. Pathology showed. Polyps were mixed with benign lymphoid tissue and tubular adenoma. Recommended repeat in 6 months. Last EGD 02/02/2012 for long-standing GERD, iron deficiency anemia, history of cirrhosis. This is completed under deep sedation. Findings included moderate size hiatal hernia, otherwise normal exam   Today he states he feels well overall. Is scheduled to see surgeon tomorrow for lymph node biopsy and port placement. Denies abdominal pain, N/V. Is on Prilosec 40 mg bid for GERD symptoms and this controls his symptoms well. Denies breakthrough symptoms. Denies hematochezia and melena. Does have darkened stools but he is on iron supplementation for anemia. Has a bowel movement about once every couple days, normal stools without straining consistent with [place] 4 which is normal for him. Denies fevers and unintentional weight loss. Continues to have shortness of breath with exertion but not rest. Admits rare/occasional dizziness. Denies syncope, near syncope. Denies any other upper or lower GI symptoms.     Past Medical History  Diagnosis Date  . COPD (chronic obstructive pulmonary disease)     Uses occasional nighttime O2  . Disseminated herpes zoster 2010  . GERD (gastroesophageal reflux disease)   . Asthma   . Pulmonary embolus     2003 and 2011  . Pulmonary nodules     Chest CT 09/11  . Mixed hyperlipidemia   . Chronic atrial fibrillation   . Lupus anticoagulant positive   . Essential hypertension, benign   . Cholelithiasis   . Rheumatoid arthritis(714.0)   . History of chicken pox 1941; 2011  . Anemia   . Chronic lower back pain   . Chronic diastolic heart failure   . Cirrhosis     Questionable, AFP normal Feb 01/2012, hx of ETOH use   . Headache(784.0)   . Cervical spondylosis without myelopathy   . Stage III chronic kidney disease   . Cellulitis of leg, left 01/03/2015  . Aortic stenosis, moderate 05/21/2015    Past Surgical History  Procedure Laterality Date  . Polypectomy  02/02/2012    RMR: Multiple colonic polyps removed, flat, tubular adenomas/ Left-sided diverticulosis  . Posterior lumbar vertebrae excision  2003; 2009; 2011  . Myringotomy      "3 times; both ears"  . Cataract extraction w/ intraocular lens  implant, bilateral    . Cholecystectomy  05/23/2012    Procedure: LAPAROSCOPIC CHOLECYSTECTOMY WITH INTRAOPERATIVE CHOLANGIOGRAM;  Surgeon: Stark Klein, MD;  Location: [facility];  Service: General;  Laterality: N/A;  . Esophagogastroduodenoscopy  02/02/12    QAS:[id number] size hiatal hernia; otherwise normal exam  . Colonoscopy  01/24/2013    LNL:[id number] polyps/colonic diverticulosis  . Back surgery  Current Outpatient Prescriptions  Medication Sig Dispense Refill  . albuterol (PROAIR HFA) 108 (90 BASE) MCG/ACT inhaler Inhale 2 puffs into the lungs every 6 (six) hours as needed. Shortness of Breath 1 Inhaler 3  . cetirizine (ZYRTEC) 10 MG tablet Take 10 mg by mouth daily.      . digoxin (LANOXIN) 0.125 MG tablet Take 1 tablet (0.125 mg total) by mouth daily.  .    . diltiazem (CARDIZEM CD) 360 MG 24 hr capsule Take 1 capsule (360 mg total) by mouth daily. 30 capsule 3  . Etanercept (ENBREL) 25 MG/0.5ML SOSY Inject into the skin 2 (two) times a week.     . folic acid (FOLVITE) 1 MG tablet Take 1 mg by mouth daily.     . furosemide (LASIX) 40 MG tablet Take 0.5 tablets (20 mg total) by mouth daily. Take daily for prevention of fluid buildup.    . gabapentin (NEURONTIN) 300 MG capsule Take 2 capsules (600 mg total) by mouth 2 (two) times daily. 30 capsule 0  . ketoconazole (NIZORAL) 2 % shampoo Apply 1 application topically 3 (three) times a week.     . methotrexate (RHEUMATREX) 2.5 MG tablet Take 7.5 mg by mouth 2 (two) times a week. *TAKE 3 TABLETS BY MOUTH TWICE WEEKLY ON FRIDAYS AND SATURDAYS*    . mometasone (NASONEX) 50 MCG/ACT nasal spray Place 2 sprays into the nose daily.    . Omega-3 Fatty Acids (FISH OIL) 1000 MG CAPS Take 2 capsules by mouth 2 (two) times daily.     Marland Kitchen omeprazole (PRILOSEC) 40 MG capsule Take 40 mg by mouth daily.    Marland Kitchen oxyCODONE-acetaminophen (PERCOCET) 10-325 MG per tablet Take 1 tablet by mouth every 6 (six) hours as needed for pain.    . potassium chloride SA (K-DUR,KLOR-CON) 20 MEQ tablet Take 0.5 tablets (10 mEq total) by mouth daily. Starting 08/27/15.    . tamsulosin (FLOMAX) 0.4 MG CAPS capsule Take 1 capsule (0.4 mg total) by mouth at bedtime. For prostate treatment. 30 capsule 3  . Vitamin D, Ergocalciferol, (DRISDOL) 50000 UNITS CAPS capsule Take 1 capsule by mouth once a week.  4  . albuterol (PROVENTIL) (2.5 MG/3ML) 0.083% nebulizer solution Take 3 mLs (2.5 mg total) by nebulization every 6 (six) hours as needed. Shortness of Breath (Patient not taking: Reported on 09/07/2015) 75 mL 12  . ferrous sulfate 325 (65 FE) MG tablet Take 325 mg by mouth daily.    . Rivaroxaban (XARELTO) 15 MG TABS tablet Take 15 mg by mouth.    . tadalafil (CIALIS) 5 MG tablet Take 5 mg by mouth daily.     No current facility-administered  medications for this visit.    Allergies as of 09/07/2015 - Review Complete 09/07/2015  Allergen Reaction Noted  . Cymbalta [duloxetine hcl] Other (See Comments) 05/21/2012  . Procaine hcl Hives and Other (See Comments)   . Bystolic [nebivolol hcl] Other (See Comments) 07/16/2013    Family History  Problem Relation Age of Onset  . Colon cancer Neg Hx   . Liver disease Neg Hx     Social History   Social History  . Marital Status: Widowed    Spouse Name: N/A  . Number of Children: 6  . Years of Education: 7th   Occupational History  . retired   . RETIRED    Social History Main Topics  . Smoking status: Former Smoker -- 1.00 packs/day for 57 years    Types: Cigarettes  Quit date: 01/27/1998  . Smokeless tobacco: Never Used  . Alcohol Use: No     Comment: "quit drinking 1976"  . Drug Use: No  . Sexual Activity: Not Currently   Other Topics Concern  . Not on file   Social History Narrative    Review of Systems: General: Negative for anorexia, weight loss, fever, chills, fatigue, weakness. Eyes: Negative for vision changes.  ENT: Negative for hoarseness, difficulty swallowing. CV: Negative for chest pain, angina, palpitations, dyspnea on exertion, peripheral edema.  Respiratory: Negative for dyspnea at rest, dyspnea on exertion, cough, sputum, wheezing.  GI: See history of present illness. MS: Admits chronic joint pain. Derm: Negative for rash or itching.  Endo: Negative for unusual weight change.  Heme: Negative for bruising or bleeding.    Physical Exam: There were no vitals taken for this visit. General:   Alert and oriented. Pleasant and cooperative. Well-nourished and well-developed.  Head:  Normocephalic and atraumatic. Eyes:  Without icterus, sclera clear and conjunctiva pink.  Ears:  Normal auditory acuity. Cardiovascular:  S1, S2 present without murmurs appreciated. Normal pulses noted. Extremities without clubbing or edema. Respiratory:  Clear to  auscultation bilaterally. No wheezes, rales, or rhonchi. No distress.  Gastrointestinal:  +BS, soft, non-tender and non-distended. No HSM noted. No guarding or rebound. No masses appreciated.  Rectal:  Deferred  Musculoskalatal:  Ambulates with a cane. Skin:  Intact without significant lesions or rashes. Neurologic:  Alert and oriented x4;  grossly normal neurologically. Psych:  Alert and cooperative. Normal mood and affect. Heme/Lymph/Immune: No excessive bruising noted.    09/07/2015 11:53 AM

## 2015-09-08 ENCOUNTER — Encounter (HOSPITAL_COMMUNITY): Payer: Self-pay

## 2015-09-08 NOTE — Patient Instructions (Signed)
[name]  09/08/2015     @PREFPERIOPPHARMACY @   Your procedure is scheduled on  09/11/2015  Report to [facility] at  68  A.M.  Call this number if you have problems the morning of surgery:  [phone]   Remember:  Do not eat food or drink liquids after midnight.  Take these medicines the morning of surgery with A SIP OF WATER  Zyrtec, digoxin, cariazem, neyrontin, prilosec, oxycodone, flomax. Take your inhaler and your nebulizer before you come.   Do not wear jewelry, make-up or nail polish.  Do not wear lotions, powders, or perfumes.  You may wear deodorant.  Do not shave 48 hours prior to surgery.  Men may shave face and neck.  Do not bring valuables to the hospital.  [facility] is not responsible for any belongings or valuables.  Contacts, dentures or bridgework may not be worn into surgery.  Leave your suitcase in the car.  After surgery it may be brought to your room.  For patients admitted to the hospital, discharge time will be determined by your treatment team.  Patients discharged the day of surgery will not be allowed to drive home.   Name and phone number of your driver:   family Special instructions:  none  Please read over the following fact sheets that you were given. Pain Booklet, Coughing and Deep Breathing, Surgical Site Infection Prevention, Anesthesia Post-op Instructions and Care and Recovery After Surgery      Swollen Lymph Nodes The lymphatic system filters fluid from around cells. It is like a system of blood vessels. These channels carry lymph instead of blood. The lymphatic system is an important part of the immune (disease fighting) system. When people talk about "swollen glands in the neck," they are usually talking about swollen lymph nodes. The lymph nodes are like the little traps for infection. You and your caregiver may be able to feel lymph nodes, especially swollen nodes, in these common areas: the groin (inguinal  area), armpits (axilla), and above the clavicle (supraclavicular). You may also feel them in the neck (cervical) and the back of the head just above the hairline (occipital). Swollen glands occur when there is any condition in which the body responds with an allergic type of reaction. For instance, the glands in the neck can become swollen from insect bites or any type of minor infection on the head. These are very noticeable in children with only minor problems. Lymph nodes may also become swollen when there is a tumor or problem with the lymphatic system, such as Hodgkin's disease. TREATMENT   Most swollen glands do not require treatment. They can be observed (watched) for a short period of time, if your caregiver feels it is necessary. Most of the time, observation is not necessary.  Antibiotics (medicines that kill germs) may be prescribed by your caregiver. Your caregiver may prescribe these if he or she feels the swollen glands are due to a bacterial (germ) infection. Antibiotics are not used if the swollen glands are caused by a virus. HOME CARE INSTRUCTIONS   Take medications as directed by your caregiver. Only take over-the-counter or prescription medicines for pain, discomfort, or fever as directed by your caregiver. SEEK MEDICAL CARE IF:   If you begin to run a temperature greater than 102 F (38.9 C), or as your caregiver suggests. MAKE SURE YOU:   Understand these instructions.  Will watch your condition.  Will  get help right away if you are not doing well or get worse. Document Released: 12/02/2002 Document Revised: 03/05/2012 Document Reviewed: 12/12/2005 [facility] Patient Information 2015 [place], [place]. This information is not intended to replace advice given to you by your health care provider. Make sure you discuss any questions you have with your health care provider. Biopsy Care After Refer to this sheet in the next few weeks. These instructions provide you with  information on caring for yourself after your procedure. Your caregiver may also give you more specific instructions. Your treatment has been planned according to current medical practices, but problems sometimes occur. Call your caregiver if you have any problems or questions after your procedure. If you had a fine needle biopsy, you may have soreness at the biopsy site for 1 to 2 days. If you had an open biopsy, you may have soreness at the biopsy site for 3 to 4 days. HOME CARE INSTRUCTIONS   You may resume normal diet and activities as directed.  Change bandages (dressings) as directed. If your wound was closed with a skin glue (adhesive), it will wear off and begin to peel in 7 days.  Only take over-the-counter or prescription medicines for pain, discomfort, or fever as directed by your caregiver.  Ask your caregiver when you can bathe and get your wound wet. SEEK IMMEDIATE MEDICAL CARE IF:   You have increased bleeding (more than a small spot) from the biopsy site.  You notice redness, swelling, or increasing pain at the biopsy site.  You have pus coming from the biopsy site.  You have a fever.  You notice a bad smell coming from the biopsy site or dressing.  You have a rash, have difficulty breathing, or have any allergic problems. MAKE SURE YOU:   Understand these instructions.  Will watch your condition.  Will get help right away if you are not doing well or get worse. Document Released: 07/01/2005 Document Revised: 03/05/2012 Document Reviewed: 06/09/2011 [facility] Patient Information 2015 [place], [place]. This information is not intended to replace advice given to you by your health care provider. Make sure you discuss any questions you have with your health care provider. Implanted Port Insertion An implanted port is a central line that has a round shape and is placed under the skin. It is used as a long-term IV access for:   Medicines, such as chemotherapy.    Fluids.   Liquid nutrition, such as total parenteral nutrition (TPN).   Blood samples.  LET [facility] CARE PROVIDER KNOW ABOUT:  Allergies to food or medicine.   Medicines taken, including vitamins, herbs, eye drops, creams, and over-the-counter medicines.   Any allergies to heparin.  Use of steroids (by mouth or creams).   Previous problems with anesthetics or numbing medicines.   History of bleeding problems or blood clots.   Previous surgery.   Other health problems, including diabetes and kidney problems.   Possibility of pregnancy, if this applies. RISKS AND COMPLICATIONS Generally, this is a safe procedure. However, as with any procedure, problems can occur. Possible problems include:  Damage to the blood vessel, bruising, or bleeding at the puncture site.   Infection.  Blood clot in the vessel that the port is in.  Breakdown of the skin over your port.  Very rarely a person may develop a condition called a pneumothorax, a collection of air in the chest that may cause one of the lungs to collapse. The placement of these catheters with the appropriate imaging  guidance significantly decreases the risk of a pneumothorax.  BEFORE THE PROCEDURE   Your health care provider may want you to have blood tests. These tests can help tell how well your kidneys and liver are working. They can also show how well your blood clots.   If you take blood thinners (anticoagulant medicines), ask your health care provider when you should stop taking them.   Make arrangements for someone to drive you home. This is necessary if you have been sedated for your procedure.  PROCEDURE  Port insertion usually takes about 30-45 minutes.   An IV needle will be inserted in your arm. Medicine for pain and medicine to help relax you (sedative) will flow directly into your body through this needle.   You will lie on an exam table, and you will be connected to monitors to keep  track of your heart rate, blood pressure, and breathing throughout the procedure.  An oxygen monitoring device may be attached to your finger. Oxygen will be given.   Everything will be kept as germ free (sterile) as possible during the procedure. The skin near the point of the incision will be cleansed with antiseptic, and the area will be draped with sterile towels. The skin and deeper tissues over the port area will be made numb with a local anesthetic.  Two small cuts (incisions) will be made in the skin to insert the port. One will be made in the neck to obtain access to the vein where the catheter will lie.   Because the port reservoir will be placed under the skin, a small skin incision will be made in the upper chest, and a small pocket for the port will be made under the skin. The catheter that will be connected to the port tunnels to a large central vein in the chest. A small, raised area will remain on your body at the site of the reservoir when the procedure is complete.  The port placement will be done under imaging guidance to ensure the proper placement.  The reservoir has a silicone covering that can be punctured with a special needle.   The port will be flushed with normal saline, and blood will be drawn to make sure it is working properly.  There will be nothing remaining outside the skin when the procedure is finished.   Incisions will be held together by stitches, surgical glue, or a special tape. AFTER THE PROCEDURE  You will stay in a recovery area until the anesthesia has worn off. Your blood pressure and pulse will be checked.  A final chest X-ray will be taken to check the placement of the port and to ensure that there is no injury to your lung. Document Released: 10/02/2013 Document Revised: 04/28/2014 Document Reviewed: 10/02/2013 [facility] Patient Information 2015 [place], [place]. This information is not intended to replace advice given to you by your health  care provider. Make sure you discuss any questions you have with your health care provider. PATIENT INSTRUCTIONS POST-ANESTHESIA  IMMEDIATELY FOLLOWING SURGERY:  Do not drive or operate machinery for the first twenty four hours after surgery.  Do not make any important decisions for twenty four hours after surgery or while taking narcotic pain medications or sedatives.  If you develop intractable nausea and vomiting or a severe headache please notify your doctor immediately.  FOLLOW-UP:  Please make an appointment with your surgeon as instructed. You do not need to follow up with anesthesia unless specifically instructed to do so.  WOUND CARE INSTRUCTIONS (if applicable):  Keep a dry clean dressing on the anesthesia/puncture wound site if there is drainage.  Once the wound has quit draining you may leave it open to air.  Generally you should leave the bandage intact for twenty four hours unless there is drainage.  If the epidural site drains for more than 36-48 hours please call the anesthesia department.  QUESTIONS?:  Please feel free to call your physician or the hospital operator if you have any questions, and they will be happy to assist you.

## 2015-09-08 NOTE — H&P (Signed)
  NTS SOAP Note  Vital Signs:  Vitals as of: 1/60/7371: Systolic 062: Diastolic 83: Heart Rate 72: Temp 97.4F: Height 48ft 8in: Weight 202Lbs 0 Ounces: Pain Level 7: BMI 30.71  BMI : 30.71 kg/m2  Subjective: This 81 year old male presents for of lung mass and lymphadenopathy.  Has been referred by Oncology for lymph node biopsy and portacath insertion.  Review of Symptoms:  Constitutional:fatigue headache Eyes:unremarkable   sinus problems Cardiovascular:  unremarkable Respiratory:dyspnea, cough Gastrointestinheartburn Genitourinary:urgency, frequency joint, neck, and back pain as above Allergic/Immunologic:unremarkable   Past Medical History:  Reviewed  Past Medical History  Surgical History: colonoscopy, back surgery, eye surgery, cholecystectomy, right thoracentesis Medical Problems: asthma, PEn, lung nodules, lymphadenopathy, anemia, thrombocytopenia, chronic atrial fibrillation, aortic stenosis, chronic kidney disease Allergies: cymbalta, procaine, bystolic,  Medications: proair, proventil, lanoxin, cardizem, vibramycin, lasix, neuroton, prilosec, percocet, kdur, steroid dose pack, flomax, cialis   Social History:Reviewed  Social History  Preferred Language: English Race:  White Ethnicity: Not Hispanic / Latino Age: 72 year Marital Status:  W Alcohol: no, quit in remote past   Smoking Status: Former smoker reviewed on 09/08/2015 Started Date:  Stopped Date:  Functional Status reviewed on 09/08/2015 ------------------------------------------------ Bathing: Normal Cooking: Normal Dressing: Normal Driving: Normal Eating: Normal Managing Meds: Normal Oral Care: Normal Shopping: Normal Toileting: Normal Transferring: Normal Walking: Normal Cognitive Status reviewed on 09/08/2015 ------------------------------------------------ Attention: Normal Decision Making: Normal Language: Normal Memory: Normal Motor: Normal Perception: Normal Problem  Solving: Normal Visual and Spatial: Normal   Family History:Reviewed  Family Health History Family History is Unknown    Objective Information: General:Well appearing, well nourished in no distress. Heart: Rhythm:  irregularly irregular  distant heart sounds No wheezing, decreased breath sounds on right Abdomen:Soft, NT/ND, no HSM, no masses.  Right sided inguinal ymphadenopathy noted. CT, PET, thoracentesis results reviewed.  DId receive lidocaine in past without difficulty Assessment:Lymphadenoipathy, lung mass, right pleural effusion  Diagnoses: 785.6  R59.0 Inguinal lymphadenopathy (Localized enlarged lymph nodes)  Procedures: 69485 - OFFICE OUTPATIENT NEW 30 MINUTES    Plan:  Scheduled for right inguinal  lymph node biopsy, portacath insertion on 09/11/15.   Patient Education:Alternative treatments to surgery were discussed with patient (and family).  Risks and benefits  of procedure including bleeding, infection, and pneumothorax were fully explained to the patient (and family) who gave informed consent. Patient/family questions were addressed.  Follow-up:Pending Surgery

## 2015-09-09 ENCOUNTER — Encounter (HOSPITAL_COMMUNITY): Payer: Self-pay

## 2015-09-09 ENCOUNTER — Ambulatory Visit (HOSPITAL_COMMUNITY)
Admission: RE | Admit: 2015-09-09 | Discharge: 2015-09-09 | Disposition: A | Discharge status: Home / Self Care | Payer: Medicare Other | Source: Ambulatory Visit | Attending: General Surgery | Admitting: General Surgery

## 2015-09-09 ENCOUNTER — Other Ambulatory Visit: Payer: Self-pay

## 2015-09-09 ENCOUNTER — Encounter (HOSPITAL_COMMUNITY)
Admission: RE | Admit: 2015-09-09 | Discharge: 2015-09-09 | Disposition: A | Discharge status: Home / Self Care | Payer: Medicare Other | Source: Ambulatory Visit | Attending: General Surgery | Admitting: General Surgery

## 2015-09-09 DIAGNOSIS — R590 Localized enlarged lymph nodes: Secondary | ICD-10-CM | POA: Insufficient documentation

## 2015-09-09 DIAGNOSIS — Z01818 Encounter for other preprocedural examination: Secondary | ICD-10-CM | POA: Insufficient documentation

## 2015-09-09 DIAGNOSIS — R591 Generalized enlarged lymph nodes: Secondary | ICD-10-CM

## 2015-09-09 DIAGNOSIS — Z01810 Encounter for preprocedural cardiovascular examination: Secondary | ICD-10-CM | POA: Insufficient documentation

## 2015-09-09 HISTORY — DX: Cardiac arrhythmia, unspecified: I49.9

## 2015-09-09 NOTE — Pre-Procedure Instructions (Signed)
Patient and family given information to sign up for my chart at home.

## 2015-09-09 NOTE — Pre-Procedure Instructions (Addendum)
Patient in for PAT. HR ranging from 40-115 while on monitor. Patient has hx of AFib. New 12 lead done and shows AFib with slow ventricular response rate of 57. Dr Patsey Berthold and Dr Arnoldo Morale notified and have discussed patients health history at length.

## 2015-09-09 NOTE — Progress Notes (Signed)
   09/09/15 0917  OBSTRUCTIVE SLEEP APNEA  Have you ever been diagnosed with sleep apnea through a sleep study? No  Do you snore loudly (loud enough to be heard through closed doors)?  0  Do you often feel tired, fatigued, or sleepy during the daytime (such as falling asleep during driving or talking to someone)? 1  Has anyone observed you stop breathing during your sleep? 0  Do you have, or are you being treated for high blood pressure? 1  BMI more than 35 kg/m2? 0  Age > 50 (1-yes) 1  Neck circumference greater than:Male 16 inches or larger, Male 17inches or larger? 0  Male Gender (Yes=1) 1  Obstructive Sleep Apnea Score 4  Score 5 or greater  Results sent to PCP

## 2015-09-10 ENCOUNTER — Encounter: Payer: Self-pay | Admitting: Nurse Practitioner

## 2015-09-10 NOTE — Progress Notes (Signed)
cc'ed to pcp °

## 2015-09-10 NOTE — Assessment & Plan Note (Signed)
Patient with a history of cirrhosis with last endoscopy completed on 2013 which found moderate size hiatal hernia otherwise normal exam. We will complete an endoscopy per oncology request prior to starting treatment for lung mass.

## 2015-09-10 NOTE — Assessment & Plan Note (Addendum)
Agent with a history of adenomatous polyps on last colonoscopy with noted poor prep. Recommended 6 month follow-up which was never completed by the patient. At this point we'll plan for overdue surveillance colonoscopy with extended prep to promote better outcome.  Proceed with TCS with Dr. Gala Romney in near future: the risks, benefits, and alternatives have been discussed with the patient in detail. The patient states understanding and desires to proceed.  The patient is not on any anticoagulants. He is on Percocet as needed as well as Neurontin. Last colonoscopy was completed under conscious sedation with no complications, however his previous endoscopy was completed under deep sedation. We'll order his procedure under propofol/MAC to promote adequate sedation.

## 2015-09-10 NOTE — Assessment & Plan Note (Signed)
81 year old male with diagnosis of likely lung cancer. Oncology requesting upper endoscopy to evaluate gastric wall thickening. Patient also has a history of cirrhosis with last endoscopy in 2013 which found no esophageal varices. He is overdue for surveillance colonoscopy. We'll add an endoscopy to his colonoscopy to further evaluate and help oncology best plan of treatment.  Proceed with EGD with Dr. Gala Romney in near future: the risks, benefits, and alternatives have been discussed with the patient in detail. The patient states understanding and desires to proceed.  The patient is on Percocet as needed as well as Neurontin. He is not on any anticoagulants. Last EGD completed under deep sedation. We will have the procedure performed under propofol/MAC to promote adequate sedation.

## 2015-09-11 ENCOUNTER — Ambulatory Visit (HOSPITAL_COMMUNITY): Payer: Medicare Other | Admitting: Certified Registered Nurse Anesthetist

## 2015-09-11 ENCOUNTER — Ambulatory Visit (HOSPITAL_COMMUNITY): Payer: Medicare Other

## 2015-09-11 ENCOUNTER — Encounter (HOSPITAL_COMMUNITY): Payer: Self-pay | Admitting: *Deleted

## 2015-09-11 ENCOUNTER — Ambulatory Visit (HOSPITAL_COMMUNITY)
Admission: RE | Admit: 2015-09-11 | Discharge: 2015-09-11 | Disposition: A | Discharge status: Home / Self Care | Payer: Medicare Other | Source: Ambulatory Visit | Attending: General Surgery | Admitting: General Surgery

## 2015-09-11 ENCOUNTER — Encounter (HOSPITAL_COMMUNITY)
Admission: RE | Disposition: A | Discharge status: Home / Self Care | Payer: Self-pay | Source: Ambulatory Visit | Attending: General Surgery

## 2015-09-11 DIAGNOSIS — C8330 Diffuse large B-cell lymphoma, unspecified site: Secondary | ICD-10-CM | POA: Diagnosis present

## 2015-09-11 DIAGNOSIS — R0602 Shortness of breath: Secondary | ICD-10-CM | POA: Diagnosis not present

## 2015-09-11 DIAGNOSIS — J45909 Unspecified asthma, uncomplicated: Secondary | ICD-10-CM | POA: Diagnosis present

## 2015-09-11 DIAGNOSIS — I5031 Acute diastolic (congestive) heart failure: Secondary | ICD-10-CM | POA: Diagnosis present

## 2015-09-11 DIAGNOSIS — Z87891 Personal history of nicotine dependence: Secondary | ICD-10-CM

## 2015-09-11 DIAGNOSIS — E782 Mixed hyperlipidemia: Secondary | ICD-10-CM | POA: Diagnosis present

## 2015-09-11 DIAGNOSIS — J441 Chronic obstructive pulmonary disease with (acute) exacerbation: Secondary | ICD-10-CM | POA: Diagnosis not present

## 2015-09-11 DIAGNOSIS — Z95828 Presence of other vascular implants and grafts: Secondary | ICD-10-CM

## 2015-09-11 DIAGNOSIS — Z9981 Dependence on supplemental oxygen: Secondary | ICD-10-CM

## 2015-09-11 DIAGNOSIS — D649 Anemia, unspecified: Secondary | ICD-10-CM | POA: Diagnosis present

## 2015-09-11 DIAGNOSIS — D696 Thrombocytopenia, unspecified: Secondary | ICD-10-CM | POA: Diagnosis present

## 2015-09-11 DIAGNOSIS — J90 Pleural effusion, not elsewhere classified: Secondary | ICD-10-CM | POA: Diagnosis present

## 2015-09-11 DIAGNOSIS — M069 Rheumatoid arthritis, unspecified: Secondary | ICD-10-CM | POA: Diagnosis present

## 2015-09-11 DIAGNOSIS — J189 Pneumonia, unspecified organism: Secondary | ICD-10-CM | POA: Diagnosis present

## 2015-09-11 DIAGNOSIS — I482 Chronic atrial fibrillation: Secondary | ICD-10-CM | POA: Diagnosis present

## 2015-09-11 DIAGNOSIS — Y95 Nosocomial condition: Secondary | ICD-10-CM | POA: Diagnosis present

## 2015-09-11 DIAGNOSIS — I129 Hypertensive chronic kidney disease with stage 1 through stage 4 chronic kidney disease, or unspecified chronic kidney disease: Secondary | ICD-10-CM | POA: Diagnosis present

## 2015-09-11 DIAGNOSIS — N183 Chronic kidney disease, stage 3 (moderate): Secondary | ICD-10-CM | POA: Diagnosis present

## 2015-09-11 DIAGNOSIS — J9611 Chronic respiratory failure with hypoxia: Secondary | ICD-10-CM | POA: Diagnosis present

## 2015-09-11 DIAGNOSIS — G8929 Other chronic pain: Secondary | ICD-10-CM | POA: Diagnosis present

## 2015-09-11 DIAGNOSIS — Z7901 Long term (current) use of anticoagulants: Secondary | ICD-10-CM

## 2015-09-11 DIAGNOSIS — Z86711 Personal history of pulmonary embolism: Secondary | ICD-10-CM

## 2015-09-11 HISTORY — PX: LYMPH NODE BIOPSY: SHX201

## 2015-09-11 HISTORY — PX: PORTACATH PLACEMENT: SHX2246

## 2015-09-11 SURGERY — LYMPH NODE BIOPSY
Anesthesia: Monitor Anesthesia Care | Site: Groin | Laterality: Right

## 2015-09-11 MED ORDER — SODIUM CHLORIDE 0.9 % IV SOLN
INTRAVENOUS | Status: DC | PRN
  Administered 2015-09-11: 500 mL via INTRAMUSCULAR

## 2015-09-11 MED ORDER — ONDANSETRON HCL 4 MG/2ML IJ SOLN: 4.0000 mg | Freq: Once | INTRAMUSCULAR | Status: DC | PRN

## 2015-09-11 MED ORDER — LACTATED RINGERS IV SOLN
INTRAVENOUS | Status: DC
  Administered 2015-09-11: 11:00:00 via INTRAVENOUS

## 2015-09-11 MED ORDER — MIDAZOLAM HCL 2 MG/2ML IJ SOLN
1.0000 mg | INTRAMUSCULAR | Status: DC | PRN
  Administered 2015-09-11: 2 mg via INTRAVENOUS

## 2015-09-11 MED ORDER — FENTANYL CITRATE (PF) 100 MCG/2ML IJ SOLN
25.0000 ug | INTRAMUSCULAR | Status: AC
  Administered 2015-09-11: 25 ug via INTRAVENOUS

## 2015-09-11 MED ORDER — FENTANYL CITRATE (PF) 100 MCG/2ML IJ SOLN
INTRAMUSCULAR | Status: DC | PRN
  Administered 2015-09-11: 25 ug via INTRAVENOUS

## 2015-09-11 MED ORDER — CEFAZOLIN SODIUM-DEXTROSE 2-3 GM-% IV SOLR
2.0000 g | INTRAVENOUS | Status: AC
  Administered 2015-09-11: 2 g via INTRAVENOUS
  Filled 2015-09-11: qty 50

## 2015-09-11 MED ORDER — MIDAZOLAM HCL 2 MG/2ML IJ SOLN
INTRAMUSCULAR | Status: AC
  Filled 2015-09-11: qty 2

## 2015-09-11 MED ORDER — SODIUM CHLORIDE 0.9 % IV SOLN
INTRAVENOUS | Status: DC | PRN
  Administered 2015-09-11: 12:00:00 via INTRAVENOUS

## 2015-09-11 MED ORDER — LIDOCAINE HCL (PF) 1 % IJ SOLN
INTRAMUSCULAR | Status: DC | PRN
  Administered 2015-09-11: 12 mL
  Administered 2015-09-11: 8 mL

## 2015-09-11 MED ORDER — LIDOCAINE HCL (CARDIAC) 10 MG/ML IV SOLN
INTRAVENOUS | Status: DC | PRN
  Administered 2015-09-11: 40 mg via INTRAVENOUS

## 2015-09-11 MED ORDER — PROPOFOL 10 MG/ML IV BOLUS
INTRAVENOUS | Status: AC
  Filled 2015-09-11: qty 20

## 2015-09-11 MED ORDER — LIDOCAINE HCL (PF) 1 % IJ SOLN
INTRAMUSCULAR | Status: AC
  Filled 2015-09-11: qty 30

## 2015-09-11 MED ORDER — CHLORHEXIDINE GLUCONATE 4 % EX LIQD: 1.0000 "application " | Freq: Once | CUTANEOUS | Status: DC

## 2015-09-11 MED ORDER — KETOROLAC TROMETHAMINE 30 MG/ML IJ SOLN: 30.0000 mg | Freq: Once | INTRAMUSCULAR | Status: DC

## 2015-09-11 MED ORDER — HEPARIN SOD (PORK) LOCK FLUSH 100 UNIT/ML IV SOLN
INTRAVENOUS | Status: AC
  Filled 2015-09-11: qty 5

## 2015-09-11 MED ORDER — OXYCODONE-ACETAMINOPHEN 10-325 MG PO TABS: 1.0000 | ORAL_TABLET | Freq: Four times a day (QID) | ORAL | Status: DC | PRN

## 2015-09-11 MED ORDER — ALBUTEROL SULFATE HFA 108 (90 BASE) MCG/ACT IN AERS
INHALATION_SPRAY | RESPIRATORY_TRACT | Status: DC | PRN
  Administered 2015-09-11: 2 via RESPIRATORY_TRACT

## 2015-09-11 MED ORDER — ALBUTEROL SULFATE HFA 108 (90 BASE) MCG/ACT IN AERS
INHALATION_SPRAY | RESPIRATORY_TRACT | Status: AC
Start: 2015-09-11 — End: 2015-09-11
  Filled 2015-09-11: qty 6.7

## 2015-09-11 MED ORDER — HEPARIN SOD (PORK) LOCK FLUSH 100 UNIT/ML IV SOLN
INTRAVENOUS | Status: DC | PRN
  Administered 2015-09-11: 500 [IU] via INTRAVENOUS

## 2015-09-11 MED ORDER — FENTANYL CITRATE (PF) 100 MCG/2ML IJ SOLN: 25.0000 ug | INTRAMUSCULAR | Status: DC | PRN

## 2015-09-11 MED ORDER — PROPOFOL INFUSION 10 MG/ML OPTIME
INTRAVENOUS | Status: DC | PRN
  Administered 2015-09-11: 50 ug/kg/min via INTRAVENOUS

## 2015-09-11 MED ORDER — FENTANYL CITRATE (PF) 100 MCG/2ML IJ SOLN
INTRAMUSCULAR | Status: AC
  Filled 2015-09-11: qty 4

## 2015-09-11 MED ORDER — FENTANYL CITRATE (PF) 100 MCG/2ML IJ SOLN
INTRAMUSCULAR | Status: AC
  Filled 2015-09-11: qty 2

## 2015-09-11 SURGICAL SUPPLY — 50 items
ADH SKN CLS APL DERMABOND .7 (GAUZE/BANDAGES/DRESSINGS) ×4
APPLIER CLIP 9.375 SM OPEN (CLIP) ×4
APR CLP SM 9.3 20 MLT OPN (CLIP) ×2
BAG DECANTER FOR FLEXI CONT (MISCELLANEOUS) ×4 IMPLANT
BAG HAMPER (MISCELLANEOUS) ×4 IMPLANT
CATH HICKMAN DUAL 12.0 (CATHETERS) IMPLANT
CLIP APPLIE 9.375 SM OPEN (CLIP) IMPLANT
CLOTH BEACON ORANGE TIMEOUT ST (SAFETY) ×4 IMPLANT
COVER LIGHT HANDLE STERIS (MISCELLANEOUS) ×8 IMPLANT
DECANTER SPIKE VIAL GLASS SM (MISCELLANEOUS) ×4 IMPLANT
DERMABOND ADVANCED (GAUZE/BANDAGES/DRESSINGS) ×4
DERMABOND ADVANCED .7 DNX12 (GAUZE/BANDAGES/DRESSINGS) IMPLANT
DRAPE C-ARM FOLDED MOBILE STRL (DRAPES) ×4 IMPLANT
DRAPE LAPAROTOMY TRNSV 102X78 (DRAPE) ×2 IMPLANT
DURAPREP 6ML APPLICATOR 50/CS (WOUND CARE) ×4 IMPLANT
ELECT REM PT RETURN 9FT ADLT (ELECTROSURGICAL) ×4
ELECTRODE REM PT RTRN 9FT ADLT (ELECTROSURGICAL) ×2 IMPLANT
FORMALIN 10 PREFIL 120ML (MISCELLANEOUS) ×2 IMPLANT
GLOVE BIOGEL PI IND STRL 7.5 (GLOVE) IMPLANT
GLOVE BIOGEL PI INDICATOR 7.5 (GLOVE) ×2
GLOVE EXAM NITRILE MD LF STRL (GLOVE) ×2 IMPLANT
GLOVE SURG SS PI 7.5 STRL IVOR (GLOVE) ×14 IMPLANT
GOWN STRL REUS W/TWL LRG LVL3 (GOWN DISPOSABLE) ×8 IMPLANT
IV NS 500ML (IV SOLUTION) ×4
IV NS 500ML BAXH (IV SOLUTION) ×2 IMPLANT
KIT CLEAN CATCH URINE (SET/KITS/TRAYS/PACK) ×4 IMPLANT
KIT PORT POWER 8FR ISP MRI (CATHETERS) ×4 IMPLANT
KIT ROOM TURNOVER APOR (KITS) ×4 IMPLANT
LIQUID BAND (GAUZE/BANDAGES/DRESSINGS) ×4 IMPLANT
MANIFOLD NEPTUNE II (INSTRUMENTS) ×4 IMPLANT
NDL HYPO 25X1 1.5 SAFETY (NEEDLE) ×2 IMPLANT
NEEDLE HYPO 25X1 1.5 SAFETY (NEEDLE) ×4 IMPLANT
NS IRRIG 1000ML POUR BTL (IV SOLUTION) ×4 IMPLANT
PACK MINOR (CUSTOM PROCEDURE TRAY) ×4 IMPLANT
PAD ARMBOARD 7.5X6 YLW CONV (MISCELLANEOUS) ×4 IMPLANT
PAD TELFA 3X4 1S STER (GAUZE/BANDAGES/DRESSINGS) IMPLANT
SCRUB PCMX 4 OZ (MISCELLANEOUS) ×4 IMPLANT
SET BASIN LINEN APH (SET/KITS/TRAYS/PACK) ×4 IMPLANT
SET INTRODUCER 12FR PACEMAKER (SHEATH) IMPLANT
SHEARS HARMONIC 9CM CVD (BLADE) IMPLANT
SHEATH COOK PEEL AWAY SET 8F (SHEATH) IMPLANT
SPONGE INTESTINAL PEANUT (DISPOSABLE) ×4 IMPLANT
SUT PROLENE 3 0 PS 2 (SUTURE) IMPLANT
SUT SILK 2 0 (SUTURE) ×4
SUT SILK 2-0 18XBRD TIE 12 (SUTURE) ×2 IMPLANT
SUT VIC AB 3-0 SH 27 (SUTURE)
SUT VIC AB 3-0 SH 27X BRD (SUTURE) ×2 IMPLANT
SUT VIC AB 4-0 PS2 27 (SUTURE) ×2 IMPLANT
SYR 20CC LL (SYRINGE) ×4 IMPLANT
SYR CONTROL 10ML LL (SYRINGE) ×4 IMPLANT

## 2015-09-11 NOTE — Interval H&P Note (Signed)
History and Physical Interval Note:  09/11/2015 11:12 AM  [name]  has presented today for surgery, with the diagnosis of lymphadenopathy  The various methods of treatment have been discussed with the patient and family. After consideration of risks, benefits and other options for treatment, the patient has consented to  Procedure(s): INGUINAL LYMPH NODE BIOPSY (Right) INSERTION PORT-A-CATH (N/A) as a surgical intervention .  The patient's history has been reviewed, patient examined, no change in status, stable for surgery.  I have reviewed the patient's chart and labs.  Questions were answered to the patient's satisfaction.     Aviva Signs A

## 2015-09-11 NOTE — Anesthesia Postprocedure Evaluation (Signed)
  Anesthesia Post-op Note  Patient: [name]  Procedure(s) Performed: Procedure(s): RIGHT INGUINAL LYMPH NODE BIOPSY (Right) INSERTION PORT-A-CATH (Left)  Patient Location: PACU  Anesthesia Type:MAC  Level of Consciousness: awake, alert , oriented, patient cooperative and responds to stimulation  Airway and Oxygen Therapy: Patient Spontanous Breathing and Patient connected to face mask oxygen  Post-op Pain: none  Post-op Assessment: Post-op Vital signs reviewed, Patient's Cardiovascular Status Stable, Respiratory Function Stable, Patent Airway, No signs of Nausea or vomiting and Pain level controlled              Post-op Vital Signs: Reviewed and stable  Last Vitals:  Filed Vitals:   09/11/15 1140  BP: 103/56  Pulse:   Temp:   Resp: 9    Complications: No apparent anesthesia complications

## 2015-09-11 NOTE — Transfer of Care (Signed)
Immediate Anesthesia Transfer of Care Note  Patient: [name]  Procedure(s) Performed: Procedure(s): RIGHT INGUINAL LYMPH NODE BIOPSY (Right) INSERTION PORT-A-CATH (Left)  Patient Location: PACU  Anesthesia Type:MAC  Level of Consciousness: awake, alert , patient cooperative and responds to stimulation  Airway & Oxygen Therapy: Patient Spontanous Breathing and Patient connected to face mask oxygen  Post-op Assessment: Report given to RN, Post -op Vital signs reviewed and stable and Patient moving all extremities  Post vital signs: Reviewed and stable  Last Vitals:  Filed Vitals:   09/11/15 1140  BP: 103/56  Pulse:   Temp:   Resp: 9    Complications: No apparent anesthesia complications

## 2015-09-11 NOTE — Anesthesia Preprocedure Evaluation (Signed)
Anesthesia Evaluation  Patient identified by MRN, date of birth, ID band Patient awake    Reviewed: Allergy & Precautions, H&P , NPO status , Patient's Chart, lab work & pertinent test results  History of Anesthesia Complications Negative for: history of anesthetic complications  Airway Mallampati: II  TM Distance: >3 FB     Dental  (+) Edentulous Upper, Edentulous Lower   Pulmonary shortness of breath and with exertion, COPD (Lung mass / tumor), Current Smoker, former smoker,    Pulmonary exam normal        Cardiovascular hypertension, Pt. on medications +CHF, + DOE and + DVT  + dysrhythmias Atrial Fibrillation  Rhythm:Regular Rate:Normal     Neuro/Psych  Headaches,    GI/Hepatic GERD  ,(+) Cirrhosis     substance abuse  alcohol use,   Endo/Other    Renal/GU      Musculoskeletal  (+) Arthritis , Osteoarthritis,    Abdominal   Peds  Hematology  (+) anemia ,   Anesthesia Other Findings   Reproductive/Obstetrics                             Anesthesia Physical Anesthesia Plan  ASA: IV  Anesthesia Plan: MAC   Post-op Pain Management:    Induction: Intravenous  Airway Management Planned: Simple Face Mask  Additional Equipment:   Intra-op Plan:   Post-operative Plan:   Informed Consent: I have reviewed the patients History and Physical, chart, labs and discussed the procedure including the risks, benefits and alternatives for the proposed anesthesia with the patient or authorized representative who has indicated his/her understanding and acceptance.     Plan Discussed with:   Anesthesia Plan Comments:         Anesthesia Quick Evaluation

## 2015-09-11 NOTE — Op Note (Signed)
Patient:  [name]  DOB:  Oct 01, 1931  MRN:  [id number]   Preop Diagnosis:  Lymphadenopathy, need for central venous access  Postop Diagnosis:  Same  Procedure:  Right inguinal lymph node biopsy, Port-A-Cath insertion  Surgeon:  Aviva Signs, M.D.  Anes:  Mac  Indications:  Patient is an 81 year old white male with a lung mass and generalized lymphadenopathy. Oncology is asked that I do a right inguinal lymph node biopsy for further identification of the neoplasm as well as a Port-A-Cath insertion. The risks and benefits of both procedures including bleeding, infection, and pneumothorax were fully explained to the patient, who gave informed consent.  Procedure note:  The patient was placed in the supine position. After monitored anesthesia care was given, the right groin region was prepped and draped using the usual sterile technique with PCMX. Surgical site confirmation was performed. 1% Xylocaine was used for local anesthesia.  A transverse incision was made in the right groin region down to the subcutaneous tissue. A palpable lymph node was found. This was removed and any bleeding controlled using small clips and Bovie electrocautery. The specimen was sent fresh to pathology for further examination and treatment. The subcutaneous layer was reapproximated using 3-0 Vicryl interrupted sutures. The skin was closed using a 4-0 Vicryl subcuticular suture. Dermabond was then applied.  Next, a Port-A-Cath was to be inserted. The left upper chest was prepped and draped using the usual sterile technique with DuraPrep. Surgical site confirmation was performed.  An incision was made below the left clavicle. Subcutaneous pocket was then formed. A needle is advanced into left subclavian vein using the Seldinger technique one the patient was in Trendelenburg position without difficulty. A guidewire was then advanced into the right atrium under fluoroscopic guidance. An introducer peel-away sheath  were placed over the guidewire. The catheter was then inserted through the peel-away sheath the peel-away sheath was removed. The catheter was then attached to the port and the port placed in subcutaneous pocket. Adequate positioning was confirmed by fluoroscopy. Good backflow blood was noted in the port. The port was flushed with heparin flush. The subcutaneous layer was reapproximated using 3-0 Vicryl interrupted suture. The skin was closed a 4-0 Vicryl subcuticular suture. Dermabond was applied.  All tape and needle counts were correct at the end of the procedures. The patient was transferred to PACU in stable condition. Chest x-ray performed in recovery room revealed adequate positioning of the Port-A-Cath with no evidence of pneumothorax.  Complications: None EBL: Minimal Specimen: Right groin lymph node biopsy

## 2015-09-11 NOTE — Addendum Note (Signed)
Addendum  created 09/11/15 1311 by Michele Rockers, CRNA   Modules edited: Anesthesia Flowsheet

## 2015-09-11 NOTE — Anesthesia Procedure Notes (Signed)
Procedure Name: MAC Date/Time: 09/11/2015 11:55 AM Performed by: Michele Rockers Pre-anesthesia Checklist: Patient identified, Emergency Drugs available, Suction available, Timeout performed and Patient being monitored Patient Re-evaluated:Patient Re-evaluated prior to inductionOxygen Delivery Method: Nasal Cannula

## 2015-09-11 NOTE — Addendum Note (Signed)
Addendum  created 09/11/15 1546 by Mickel Baas, CRNA   Modules edited: Charges VN

## 2015-09-11 NOTE — Discharge Instructions (Signed)
Implanted Port Insertion, Care After Refer to this sheet in the next few weeks. These instructions provide you with information on caring for yourself after your procedure. Your health care provider may also give you more specific instructions. Your treatment has been planned according to current medical practices, but problems sometimes occur. Call your health care provider if you have any problems or questions after your procedure. WHAT TO EXPECT AFTER THE PROCEDURE After your procedure, it is typical to have the following:   Discomfort at the port insertion site. Ice packs to the area will help.  Bruising on the skin over the port. This will subside in 3-4 days. HOME CARE INSTRUCTIONS  After your port is placed, you will get a manufacturer's information card. The card has information about your port. Keep this card with you at all times.   Know what kind of port you have. There are many types of ports available.   Wear a medical alert bracelet in case of an emergency. This can help alert health care workers that you have a port.   The port can stay in for as long as your health care provider believes it is necessary.   A home health care nurse may give medicines and take care of the port.   You or a family member can get special training and directions for giving medicine and taking care of the port at home.  SEEK MEDICAL CARE IF:   Your port does not flush or you are unable to get a blood return.   You have a fever or chills. SEEK IMMEDIATE MEDICAL CARE IF:  You have new fluid or pus coming from your incision.   You notice a bad smell coming from your incision site.   You have swelling, pain, or more redness at the incision or port site.   You have chest pain or shortness of breath. Document Released: 10/02/2013 Document Revised: 12/17/2013 Document Reviewed: 10/02/2013 Exit[employer] Patient Information 2015 [employer]. This information is not intended to replace  advice given to you by your health care provider. Make sure you discuss any questions you have with your health care provider.  

## 2015-09-13 ENCOUNTER — Emergency Department (HOSPITAL_COMMUNITY): Payer: Medicare Other

## 2015-09-13 ENCOUNTER — Encounter (HOSPITAL_COMMUNITY): Payer: Self-pay | Admitting: Emergency Medicine

## 2015-09-13 ENCOUNTER — Inpatient Hospital Stay (HOSPITAL_COMMUNITY)
Admission: EM | Admit: 2015-09-13 | Discharge: 2015-09-18 | DRG: 987 | Disposition: A | Discharge status: Home / Self Care | Payer: Medicare Other | Attending: Internal Medicine | Admitting: Internal Medicine

## 2015-09-13 DIAGNOSIS — I129 Hypertensive chronic kidney disease with stage 1 through stage 4 chronic kidney disease, or unspecified chronic kidney disease: Secondary | ICD-10-CM | POA: Diagnosis present

## 2015-09-13 DIAGNOSIS — M069 Rheumatoid arthritis, unspecified: Secondary | ICD-10-CM | POA: Diagnosis present

## 2015-09-13 DIAGNOSIS — D649 Anemia, unspecified: Secondary | ICD-10-CM | POA: Diagnosis present

## 2015-09-13 DIAGNOSIS — I482 Chronic atrial fibrillation: Secondary | ICD-10-CM | POA: Diagnosis present

## 2015-09-13 DIAGNOSIS — Z9889 Other specified postprocedural states: Secondary | ICD-10-CM

## 2015-09-13 DIAGNOSIS — J441 Chronic obstructive pulmonary disease with (acute) exacerbation: Secondary | ICD-10-CM

## 2015-09-13 DIAGNOSIS — C8338 Diffuse large B-cell lymphoma, lymph nodes of multiple sites: Secondary | ICD-10-CM | POA: Diagnosis present

## 2015-09-13 DIAGNOSIS — J90 Pleural effusion, not elsewhere classified: Secondary | ICD-10-CM

## 2015-09-13 DIAGNOSIS — J189 Pneumonia, unspecified organism: Secondary | ICD-10-CM | POA: Diagnosis present

## 2015-09-13 DIAGNOSIS — N183 Chronic kidney disease, stage 3 (moderate): Secondary | ICD-10-CM | POA: Diagnosis present

## 2015-09-13 DIAGNOSIS — I5032 Chronic diastolic (congestive) heart failure: Secondary | ICD-10-CM | POA: Diagnosis present

## 2015-09-13 DIAGNOSIS — C8330 Diffuse large B-cell lymphoma, unspecified site: Secondary | ICD-10-CM | POA: Diagnosis present

## 2015-09-13 DIAGNOSIS — I4891 Unspecified atrial fibrillation: Secondary | ICD-10-CM | POA: Diagnosis not present

## 2015-09-13 DIAGNOSIS — I5031 Acute diastolic (congestive) heart failure: Secondary | ICD-10-CM | POA: Diagnosis present

## 2015-09-13 DIAGNOSIS — E782 Mixed hyperlipidemia: Secondary | ICD-10-CM | POA: Diagnosis present

## 2015-09-13 DIAGNOSIS — G8929 Other chronic pain: Secondary | ICD-10-CM | POA: Diagnosis present

## 2015-09-13 DIAGNOSIS — D696 Thrombocytopenia, unspecified: Secondary | ICD-10-CM | POA: Diagnosis present

## 2015-09-13 DIAGNOSIS — Y95 Nosocomial condition: Secondary | ICD-10-CM | POA: Diagnosis present

## 2015-09-13 DIAGNOSIS — J45909 Unspecified asthma, uncomplicated: Secondary | ICD-10-CM | POA: Diagnosis present

## 2015-09-13 DIAGNOSIS — J9611 Chronic respiratory failure with hypoxia: Secondary | ICD-10-CM | POA: Diagnosis not present

## 2015-09-13 DIAGNOSIS — Z7901 Long term (current) use of anticoagulants: Secondary | ICD-10-CM | POA: Diagnosis not present

## 2015-09-13 DIAGNOSIS — R0602 Shortness of breath: Secondary | ICD-10-CM | POA: Diagnosis present

## 2015-09-13 DIAGNOSIS — J9610 Chronic respiratory failure, unspecified whether with hypoxia or hypercapnia: Secondary | ICD-10-CM | POA: Diagnosis present

## 2015-09-13 DIAGNOSIS — Z86711 Personal history of pulmonary embolism: Secondary | ICD-10-CM | POA: Diagnosis not present

## 2015-09-13 DIAGNOSIS — C8590 Non-Hodgkin lymphoma, unspecified, unspecified site: Secondary | ICD-10-CM

## 2015-09-13 DIAGNOSIS — Z9981 Dependence on supplemental oxygen: Secondary | ICD-10-CM | POA: Diagnosis not present

## 2015-09-13 DIAGNOSIS — I350 Nonrheumatic aortic (valve) stenosis: Secondary | ICD-10-CM | POA: Diagnosis not present

## 2015-09-13 DIAGNOSIS — Z87891 Personal history of nicotine dependence: Secondary | ICD-10-CM | POA: Diagnosis not present

## 2015-09-13 LAB — COMPREHENSIVE METABOLIC PANEL
ALT: 20 U/L (ref 17–63)
ANION GAP: 8 (ref 5–15)
AST: 37 U/L (ref 15–41)
Albumin: 3.5 g/dL (ref 3.5–5.0)
Alkaline Phosphatase: 58 U/L (ref 38–126)
BILIRUBIN TOTAL: 2.6 mg/dL — AB (ref 0.3–1.2)
BUN: 11 mg/dL (ref 6–20)
CO2: 28 mmol/L (ref 22–32)
Calcium: 8.8 mg/dL — ABNORMAL LOW (ref 8.9–10.3)
Chloride: 102 mmol/L (ref 101–111)
Creatinine, Ser: 0.85 mg/dL (ref 0.61–1.24)
Glucose, Bld: 81 mg/dL (ref 65–99)
POTASSIUM: 4.1 mmol/L (ref 3.5–5.1)
Sodium: 138 mmol/L (ref 135–145)
TOTAL PROTEIN: 7 g/dL (ref 6.5–8.1)

## 2015-09-13 LAB — CBC WITH DIFFERENTIAL/PLATELET
Basophils Absolute: 0 10*3/uL (ref 0.0–0.1)
Basophils Relative: 0 %
EOS ABS: 0.3 10*3/uL (ref 0.0–0.7)
EOS PCT: 4 %
HEMATOCRIT: 37.8 % — AB (ref 39.0–52.0)
Hemoglobin: 12.6 g/dL — ABNORMAL LOW (ref 13.0–17.0)
LYMPHS ABS: 0.7 10*3/uL (ref 0.7–4.0)
LYMPHS PCT: 10 %
MCH: 34.9 pg — AB (ref 26.0–34.0)
MCHC: 33.3 g/dL (ref 30.0–36.0)
MCV: 104.7 fL — AB (ref 78.0–100.0)
MONO ABS: 0.6 10*3/uL (ref 0.1–1.0)
Monocytes Relative: 9 %
Neutro Abs: 4.8 10*3/uL (ref 1.7–7.7)
Neutrophils Relative %: 76 %
Platelets: 63 10*3/uL — ABNORMAL LOW (ref 150–400)
RBC: 3.61 MIL/uL — ABNORMAL LOW (ref 4.22–5.81)
RDW: 17.3 % — AB (ref 11.5–15.5)
WBC: 6.3 10*3/uL (ref 4.0–10.5)

## 2015-09-13 LAB — BRAIN NATRIURETIC PEPTIDE: B NATRIURETIC PEPTIDE 5: 595 pg/mL — AB (ref 0.0–100.0)

## 2015-09-13 LAB — PROTIME-INR
INR: 1.17 (ref 0.00–1.49)
Prothrombin Time: 15.1 seconds (ref 11.6–15.2)

## 2015-09-13 LAB — I-STAT CG4 LACTIC ACID, ED: Lactic Acid, Venous: 2.06 mmol/L (ref 0.5–2.0)

## 2015-09-13 LAB — PROCALCITONIN: Procalcitonin: <0.1 ng/mL

## 2015-09-13 LAB — LACTIC ACID, PLASMA
Lactic Acid, Venous: 2 mmol/L (ref 0.5–2.0)
Lactic Acid, Venous: 3 mmol/L (ref 0.5–2.0)
Lactic Acid, Venous: 4 mmol/L (ref 0.5–2.0)

## 2015-09-13 LAB — TROPONIN I

## 2015-09-13 LAB — MRSA PCR SCREENING: MRSA by PCR: NEGATIVE

## 2015-09-13 MED ORDER — METHYLPREDNISOLONE SODIUM SUCC 40 MG IJ SOLR
40.0000 mg | Freq: Two times a day (BID) | INTRAMUSCULAR | Status: DC
  Administered 2015-09-13 (×2): 40 mg via INTRAVENOUS
  Filled 2015-09-13 (×2): qty 1

## 2015-09-13 MED ORDER — VANCOMYCIN HCL IN DEXTROSE 1-5 GM/200ML-% IV SOLN
1000.0000 mg | Freq: Once | INTRAVENOUS | Status: AC
  Administered 2015-09-13: 1000 mg via INTRAVENOUS
  Filled 2015-09-13: qty 200

## 2015-09-13 MED ORDER — IOHEXOL 350 MG/ML SOLN
100.0000 mL | Freq: Once | INTRAVENOUS | Status: DC | PRN
Start: 2015-09-13 — End: 2015-09-13

## 2015-09-13 MED ORDER — METHYLPREDNISOLONE SODIUM SUCC 125 MG IJ SOLR
125.0000 mg | Freq: Once | INTRAMUSCULAR | Status: AC
  Administered 2015-09-13: 125 mg via INTRAVENOUS
  Filled 2015-09-13: qty 2

## 2015-09-13 MED ORDER — VANCOMYCIN HCL IN DEXTROSE 750-5 MG/150ML-% IV SOLN
750.0000 mg | Freq: Once | INTRAVENOUS | Status: AC
  Administered 2015-09-13: 750 mg via INTRAVENOUS
  Filled 2015-09-13: qty 150

## 2015-09-13 MED ORDER — SODIUM CHLORIDE 0.9 % IV BOLUS (SEPSIS): 1000.0000 mL | Freq: Once | INTRAVENOUS | Status: DC

## 2015-09-13 MED ORDER — OXYCODONE-ACETAMINOPHEN 5-325 MG PO TABS
1.0000 | ORAL_TABLET | Freq: Four times a day (QID) | ORAL | Status: DC | PRN
  Administered 2015-09-14: 1 via ORAL
  Filled 2015-09-13: qty 1

## 2015-09-13 MED ORDER — ASPIRIN EC 81 MG PO TBEC
81.0000 mg | DELAYED_RELEASE_TABLET | Freq: Every day | ORAL | Status: DC
  Administered 2015-09-13 – 2015-09-18 (×6): 81 mg via ORAL
  Filled 2015-09-13 (×6): qty 1

## 2015-09-13 MED ORDER — SODIUM CHLORIDE 0.9 % IJ SOLN
3.0000 mL | Freq: Two times a day (BID) | INTRAMUSCULAR | Status: DC
  Administered 2015-09-13 – 2015-09-18 (×9): 3 mL via INTRAVENOUS

## 2015-09-13 MED ORDER — DILTIAZEM HCL ER COATED BEADS 180 MG PO CP24
360.0000 mg | ORAL_CAPSULE | Freq: Every day | ORAL | Status: DC
  Administered 2015-09-13 – 2015-09-18 (×6): 360 mg via ORAL
  Filled 2015-09-13 (×6): qty 2

## 2015-09-13 MED ORDER — GABAPENTIN 300 MG PO CAPS
600.0000 mg | ORAL_CAPSULE | Freq: Two times a day (BID) | ORAL | Status: DC
  Administered 2015-09-13 – 2015-09-18 (×11): 600 mg via ORAL
  Filled 2015-09-13 (×5): qty 2
  Filled 2015-09-13: qty 6
  Filled 2015-09-13 (×5): qty 2

## 2015-09-13 MED ORDER — OXYCODONE HCL 5 MG PO TABS
5.0000 mg | ORAL_TABLET | Freq: Four times a day (QID) | ORAL | Status: DC | PRN
  Administered 2015-09-13 (×2): 5 mg via ORAL
  Filled 2015-09-13 (×2): qty 1

## 2015-09-13 MED ORDER — FUROSEMIDE 10 MG/ML IJ SOLN
40.0000 mg | Freq: Two times a day (BID) | INTRAMUSCULAR | Status: AC
  Administered 2015-09-13 (×2): 40 mg via INTRAVENOUS
  Filled 2015-09-13 (×2): qty 4

## 2015-09-13 MED ORDER — SODIUM CHLORIDE 0.9 % IV BOLUS (SEPSIS)
1000.0000 mL | Freq: Once | INTRAVENOUS | Status: AC
  Administered 2015-09-13: 1000 mL via INTRAVENOUS

## 2015-09-13 MED ORDER — SODIUM CHLORIDE 0.9 % IV BOLUS (SEPSIS): 500.0000 mL | Freq: Once | INTRAVENOUS | Status: DC

## 2015-09-13 MED ORDER — IPRATROPIUM-ALBUTEROL 0.5-2.5 (3) MG/3ML IN SOLN
3.0000 mL | RESPIRATORY_TRACT | Status: AC
  Administered 2015-09-13 (×3): 3 mL via RESPIRATORY_TRACT
  Filled 2015-09-13 (×3): qty 3

## 2015-09-13 MED ORDER — PANTOPRAZOLE SODIUM 40 MG PO TBEC
40.0000 mg | DELAYED_RELEASE_TABLET | Freq: Every day | ORAL | Status: DC
  Administered 2015-09-13 – 2015-09-18 (×6): 40 mg via ORAL
  Filled 2015-09-13 (×6): qty 1

## 2015-09-13 MED ORDER — TAMSULOSIN HCL 0.4 MG PO CAPS
0.4000 mg | ORAL_CAPSULE | Freq: Every day | ORAL | Status: DC
  Administered 2015-09-13 – 2015-09-17 (×5): 0.4 mg via ORAL
  Filled 2015-09-13 (×5): qty 1

## 2015-09-13 MED ORDER — ENOXAPARIN SODIUM 40 MG/0.4ML ~~LOC~~ SOLN
40.0000 mg | SUBCUTANEOUS | Status: DC
  Administered 2015-09-13 – 2015-09-14 (×2): 40 mg via SUBCUTANEOUS
  Filled 2015-09-13 (×2): qty 0.4

## 2015-09-13 MED ORDER — LORATADINE 10 MG PO TABS
10.0000 mg | ORAL_TABLET | Freq: Every day | ORAL | Status: DC
  Administered 2015-09-13 – 2015-09-18 (×6): 10 mg via ORAL
  Filled 2015-09-13 (×6): qty 1

## 2015-09-13 MED ORDER — DIGOXIN 125 MCG PO TABS
0.1250 mg | ORAL_TABLET | Freq: Every day | ORAL | Status: DC
  Administered 2015-09-13 – 2015-09-18 (×6): 0.125 mg via ORAL
  Filled 2015-09-13 (×6): qty 1

## 2015-09-13 MED ORDER — IPRATROPIUM-ALBUTEROL 0.5-2.5 (3) MG/3ML IN SOLN
3.0000 mL | RESPIRATORY_TRACT | Status: DC
  Administered 2015-09-13 (×4): 3 mL via RESPIRATORY_TRACT
  Filled 2015-09-13 (×4): qty 3

## 2015-09-13 MED ORDER — SODIUM CHLORIDE 0.9 % IJ SOLN: 3.0000 mL | INTRAMUSCULAR | Status: DC | PRN

## 2015-09-13 MED ORDER — SODIUM CHLORIDE 0.9 % IV BOLUS (SEPSIS)
2000.0000 mL | Freq: Once | INTRAVENOUS | Status: AC
  Administered 2015-09-13: 2000 mL via INTRAVENOUS

## 2015-09-13 MED ORDER — VANCOMYCIN HCL IN DEXTROSE 1-5 GM/200ML-% IV SOLN
1000.0000 mg | Freq: Two times a day (BID) | INTRAVENOUS | Status: DC
  Administered 2015-09-13 – 2015-09-15 (×4): 1000 mg via INTRAVENOUS
  Filled 2015-09-13 (×6): qty 200

## 2015-09-13 MED ORDER — SODIUM CHLORIDE 0.9 % IV SOLN
INTRAVENOUS | Status: DC
  Administered 2015-09-13: 19:00:00 via INTRAVENOUS

## 2015-09-13 MED ORDER — SODIUM CHLORIDE 0.9 % IV SOLN: 250.0000 mL | INTRAVENOUS | Status: DC | PRN

## 2015-09-13 MED ORDER — ACETAMINOPHEN 325 MG PO TABS: 650.0000 mg | ORAL_TABLET | Freq: Four times a day (QID) | ORAL | Status: DC | PRN

## 2015-09-13 MED ORDER — ALBUTEROL SULFATE (2.5 MG/3ML) 0.083% IN NEBU: 2.5000 mg | INHALATION_SOLUTION | RESPIRATORY_TRACT | Status: DC | PRN

## 2015-09-13 MED ORDER — PIPERACILLIN-TAZOBACTAM 3.375 G IVPB
3.3750 g | Freq: Once | INTRAVENOUS | Status: AC
  Administered 2015-09-13: 3.375 g via INTRAVENOUS
  Filled 2015-09-13: qty 50

## 2015-09-13 MED ORDER — OXYCODONE-ACETAMINOPHEN 10-325 MG PO TABS: 1.0000 | ORAL_TABLET | Freq: Four times a day (QID) | ORAL | Status: DC | PRN

## 2015-09-13 MED ORDER — ACETAMINOPHEN 325 MG PO TABS: 650.0000 mg | ORAL_TABLET | ORAL | Status: DC | PRN

## 2015-09-13 MED ORDER — ONDANSETRON HCL 4 MG/2ML IJ SOLN: 4.0000 mg | Freq: Four times a day (QID) | INTRAMUSCULAR | Status: DC | PRN

## 2015-09-13 MED ORDER — IPRATROPIUM-ALBUTEROL 0.5-2.5 (3) MG/3ML IN SOLN
3.0000 mL | Freq: Four times a day (QID) | RESPIRATORY_TRACT | Status: DC
  Administered 2015-09-14 – 2015-09-15 (×3): 3 mL via RESPIRATORY_TRACT
  Filled 2015-09-13 (×3): qty 3

## 2015-09-13 MED ORDER — DEXTROSE 5 % IV SOLN
2.0000 g | Freq: Three times a day (TID) | INTRAVENOUS | Status: DC
  Administered 2015-09-13 – 2015-09-17 (×13): 2 g via INTRAVENOUS
  Filled 2015-09-13 (×19): qty 2

## 2015-09-13 MED ORDER — VANCOMYCIN HCL IN DEXTROSE 1-5 GM/200ML-% IV SOLN
INTRAVENOUS | Status: AC
  Filled 2015-09-13: qty 200

## 2015-09-13 NOTE — ED Notes (Signed)
Phlebotomy at bedside.

## 2015-09-13 NOTE — ED Provider Notes (Signed)
TIME SEEN: 6:15 AM  CHIEF COMPLAINT: Shortness of breath  HPI: Pt is a 81 y.o. male with history of COPD who uses oxygen 2 L at home, A. fib on Coumadin, pulmonary embolus in 2003 and in 2011, aortic stenosis, rheumatoid arthritis on Enbrel and methotrexate who presents emergency department shortness of breath that started earlier this morning. Has noticed bilateral lower extremity swelling around his ankles. He is unable to tell me how long this has been present. Denies any chest pain or chest discomfort but states he feels like "I can't get a breath". Denies fever. Has had a productive cough but cannot tell me what the sputum looks like. States he has not smoked since 1999. Was diagnosed with an infrahilar lung mass versus lymphadenopathy on CT scan on August 27. Underwent thoracentesis of a right pleural effusion on August 29 and cytology was concerning for bronchogenic carcinoma. Has also undergone PET imaging revealed widespread metastatic lymphadenopathy. Patient had a Port-A-Cath placed in the left chest wall and a right inguinal lymph node biopsy by Dr. Arnoldo Morale with general surgery on September 16. He has not yet started chemotherapy or radiation. States he had to increase his oxygen at home to help alleviate symptoms of shortness of breath.  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 COPD (chronic obstructive pulmonary disease)     Uses occasional nighttime O2  . Disseminated herpes zoster 2010  . GERD (gastroesophageal reflux disease)   . Asthma   . Pulmonary embolus     2003 and 2011  . Pulmonary nodules      Chest CT 09/11  . Mixed hyperlipidemia   . Chronic atrial fibrillation   . Lupus anticoagulant positive   . Essential hypertension, benign   . Cholelithiasis   . Rheumatoid arthritis(714.0)   . History of chicken pox 1941; 2011  . Anemia   . Chronic lower back pain   . Chronic diastolic heart failure   . Cirrhosis     Questionable, AFP normal Feb 01/2012, hx of ETOH use   . Headache(784.0)   . Cervical spondylosis without myelopathy   . Stage III chronic kidney disease   . Cellulitis of leg, left 01/03/2015  . Aortic stenosis, moderate 05/21/2015  . Dysrhythmia     chronic AFib    MEDICATIONS:  Prior to Admission medications   Medication Sig Start Date End Date Taking? Authorizing Provider  albuterol (PROAIR HFA) 108 (90 BASE) MCG/ACT inhaler Inhale 2 puffs into the lungs every 6 (six) hours as needed. Shortness of Breath 08/25/15   Radene Gunning, NP  cetirizine (ZYRTEC) 10 MG tablet Take 10 mg by mouth daily.      Historical Provider, MD  digoxin (LANOXIN) 0.125 MG tablet Take 1 tablet (0.125 mg total) by mouth daily. . 08/25/15   Radene Gunning, NP  diltiazem (CARDIZEM CD) 360 MG 24 hr capsule Take 1 capsule (360 mg total) by mouth daily. 05/25/15   Rexene Alberts, MD  Etanercept (ENBREL) 25 MG/0.5ML SOSY Inject into the skin 2 (two) times a week.     Historical Provider, MD  ferrous  sulfate 325 (65 FE) MG tablet Take 325 mg by mouth daily.    Historical Provider, MD  folic acid (FOLVITE) 1 MG tablet Take 1 mg by mouth daily.     Historical Provider, MD  furosemide (LASIX) 40 MG tablet Take 0.5 tablets (20 mg total) by mouth daily. Take daily for prevention of fluid buildup. 05/25/15   Rexene Alberts, MD  gabapentin (NEURONTIN) 300 MG capsule Take 2 capsules (600 mg total) by mouth 2 (two) times daily. 08/25/15   Radene Gunning, NP  ketoconazole (NIZORAL) 2 % shampoo Apply 1 application topically 3 (three) times a week.  05/21/13   Historical Provider, MD  methotrexate (RHEUMATREX) 2.5 MG  tablet Take 7.5 mg by mouth 2 (two) times a week. *TAKE 3 TABLETS BY MOUTH TWICE WEEKLY ON FRIDAYS AND SATURDAYS*    Historical Provider, MD  mometasone (NASONEX) 50 MCG/ACT nasal spray Place 2 sprays into the nose daily.    Historical Provider, MD  Omega-3 Fatty Acids (FISH OIL) 1000 MG CAPS Take 2 capsules by mouth 2 (two) times daily.     Historical Provider, MD  omeprazole (PRILOSEC) 40 MG capsule Take 40 mg by mouth daily.    Historical Provider, MD  oxyCODONE-acetaminophen (PERCOCET) 10-325 MG per tablet Take 1 tablet by mouth every 6 (six) hours as needed for pain. 09/11/15   Aviva Signs, MD  polyethylene glycol-electrolytes (NULYTELY/GOLYTELY) 420 G solution Take 4,000 mLs by mouth once. 09/07/15   Carlis Stable, NP  potassium chloride SA (K-DUR,KLOR-CON) 20 MEQ tablet Take 0.5 tablets (10 mEq total) by mouth daily. Starting 08/27/15. 08/25/15   Radene Gunning, NP  tadalafil (CIALIS) 5 MG tablet Take 5 mg by mouth daily. 05/18/15   Historical Provider, MD  tamsulosin (FLOMAX) 0.4 MG CAPS capsule Take 1 capsule (0.4 mg total) by mouth at bedtime. For prostate treatment. 05/25/15   Rexene Alberts, MD  Vitamin D, Ergocalciferol, (DRISDOL) 50000 UNITS CAPS capsule Take 1 capsule by mouth once a week. 04/27/15   Historical Provider, MD    ALLERGIES:  Allergies  Allergen Reactions  . Cymbalta [Duloxetine Hcl] Other (See Comments)    Confusion   . Procaine Hcl Hives and Other (See Comments)    NOVOCAINE: Sweating, Confusion, Not in right state of mind.  [name]    unknown    SOCIAL HISTORY:  Social History  Substance Use Topics  . Smoking status: Former Smoker -- 1.00 packs/day for 57 years    Types: Cigarettes    Quit date: 01/27/1998  . Smokeless tobacco: Never Used  . Alcohol Use: No     Comment: "quit drinking 1976"    FAMILY HISTORY: Family History  Problem Relation Age of Onset  . Colon cancer Neg Hx   . Liver disease Neg Hx     EXAM: BP  138/100 mmHg  Pulse 98  Temp(Src) 97.6 F (36.4 C) (Oral)  Ht 5\' 8"  (1.727 m)  Wt 198 lb (89.812 kg)  BMI 30.11 kg/m2  SpO2 97% CONSTITUTIONAL: Alert and oriented and responds appropriately to questions. Chronically ill-appearing, speaking short sentences HEAD: Normocephalic EYES: Conjunctivae clear, PERRL ENT: normal nose; no rhinorrhea; moist mucous membranes; pharynx without lesions noted NECK: Supple, no meningismus, no LAD  CARD: Irregularly irregular and tachycardic; S1 and S2 appreciated; systolic murmur appreciated, no clicks, no rubs, no gallops RESP: Normal chest excursion without splinting; patient is tachypneic, no hypoxia on his chronic 2 L of oxygen, speaking short sentences,  diminished at his bases bilaterally but worse on the right side, mild scattered exp Wheezing, no rhonchi or rales appreciated CHEST:  Port-A-Cath in the left chest wall, surgical incision is healing without erythema, warmth, drainage or bleeding ABD/GI: Normal bowel sounds; non-distended; soft, non-tender, no rebound, no guarding, no peritoneal signs BACK:  The back appears normal and is non-tender to palpation, there is no CVA tenderness EXT: Normal ROM in all joints; non-tender to palpation; bilateral lower extremity edema that is nonpitting proximal to the bilateral ankles; normal capillary refill; no cyanosis, no calf tenderness or swelling    SKIN: Normal color for age and race; warm NEURO: Moves all extremities equally, sensation to light touch intact diffusely, cranial nerves II through XII intact PSYCH: The patient's mood and manner are appropriate. Grooming and personal hygiene are appropriate.  MEDICAL DECISION MAKING: Patient here shortness of breath. Differential diagnosis includes COPD exacerbation, pulmonary embolus, pneumonia, pleural effusion, ACS. Last echocardiogram in May 2016 showed an ejection fraction of 65-70%. We'll obtain labs, portable chest x-ray. We'll give Solu-Medrol, DuoNebs.  Was obtain CT imaging of his chest to rule out pulmonary embolus given he is tachycardic, tachypneic. EKG shows atrial fibrillation with a rate in the 110s.  No significant change compared to prior.  ED PROGRESS: Patient's chest x-ray shows large right pleural effusion with right middle and lower lobe consolidation. Consolidation worsening since x-ray on September 14. Will treat with broad-spectrum antibiotics given he is immunocompromised on Enbrel and methotrexate and was recently admitted to the hospital in August for COPD exacerbation. Labs otherwise unremarkable. Troponin negative. His INR is subtherapeutic. We'll obtain CT imaging to rule out pulmonary embolus. Will discuss with hospitalist for admission.   7:40 AM  Pt's left-sided breath sounds are improving with DuoNeb treatments. His rate has fluctuated between the 110s -140s bust stays mostly in 110s.  Will give albuterol cautiously. Patient is receiving IV hydration as he did have some soft blood pressures that have improved. Given chest x-ray shows worsening effusion and consolidation, I think at this time we can hold on the CT image of his chest given he has had one on August 27 that ruled out pulmonary embolus. INR is subtherapeutic at this time because patient held this medication for recent biopsy, Port-A-Cath placement. Blood cultures pending. He does have a mildly elevated lactate. Rectal temperature is 98.3. Discussed with Dr. Sarajane Jews with hospitalist service and agrees with admission to step down.   EKG Interpretation  Date/Time:  Sunday September 13 2015 06:20:46 EDT Ventricular Rate:  119 PR Interval:    QRS Duration: 109 QT Interval:  357 QTC Calculation: 502 R Axis:   -101 Text Interpretation:  Atrial fibrillation LAD, consider left anterior fascicular block Borderline low voltage, extremity leads Probable RVH w/ secondary repol abnormality Prolonged QT interval Confirmed by WARD,  DO, KRISTEN [phone]) on 09/13/2015 6:26:11  AM        [place], DO 09/13/15 0211

## 2015-09-13 NOTE — H&P (Addendum)
History and Physical  [name] EXH:[id number] DOB: [birth year] DOA: 09/13/2015  Referring physician: Delice Bison Ward, DO PCP: [name], MD  Oncologist- [place]  Chief Complaint: Shortness of breath   HPI:  51 yom with history of COPD, on 2L O2 at home, A. fib, pulmonary embolus in 2003 and in 2011, and widespread metastatic lymphadenopathy presented with SOB, a productive cough,  and BLE swelling. Patient underwent a Port-A-Cath placement in the left chest wall and a right inguinal lymph node biopsy by Dr. Arnoldo Morale, general surgery, on 09/11/15. CXR in the ED revealed a large right pleural effusion with middle and lower lobe consolidation on the right however there was no change from recent prior studies. Initial labs revealed elevated BNP at 595 but were otherwise unremarkable. Acute issues felt to be COPD exacerbation, possible HCAP, afib with RVR.  SOB began a few days ago and worsened earlier this morning around 2am. Patient had to increase his baseline O2 to 2.5L from 2L with minimal relief of his SOB. He also reports BLE swelling and an intermittent productive cough but is unsure of the sputum color or when his cough began. He is unaware of any alleviating or exacerbating factors. Patient denies any fever, chills, CP, changes in vision, sore throat, rash, n/v/abdominal pain, or new muscle aches.    In the emergency department VSS,  tachycardic, afebrile, SPO2 97% on 3L. Pertinent labs: BNP mildly elevated 595, WBC 6.3, Lactic acid mildly elevated at 2.06, Platelets 63, BMP unremarkable EKG: Independently reviewed.  atrial fibrillation, VR 119, prolonged QT  Imaging: independently reviewed. CXR revealed stable moderate pleural effusion with no acute abnormalities.   Review of Systems:  Positive for SOB, productive cough, BLE swelling Negative for fever, visual changes, sore throat, rash, new muscle aches, chest pain, dysuria, bleeding, n/v/abdominal pain.  Past Medical  History  Diagnosis Date  . COPD (chronic obstructive pulmonary disease)     Uses occasional nighttime O2  . Disseminated herpes zoster 2010  . GERD (gastroesophageal reflux disease)   . Asthma   . Pulmonary embolus     2003 and 2011  . Pulmonary nodules     Chest CT 09/11  . Mixed hyperlipidemia   . Chronic atrial fibrillation   . Lupus anticoagulant positive   . Essential hypertension, benign   . Cholelithiasis   . Rheumatoid arthritis(714.0)   . History of chicken pox 1941; 2011  . Anemia   . Chronic lower back pain   . Chronic diastolic heart failure   . Cirrhosis     Questionable, AFP normal Feb 01/2012, hx of ETOH use   . Headache(784.0)   . Cervical spondylosis without myelopathy   . Stage III chronic kidney disease   . Cellulitis of leg, left 01/03/2015  . Aortic stenosis, moderate 05/21/2015  . Dysrhythmia     chronic AFib    Past Surgical History  Procedure Laterality Date  . Polypectomy  02/02/2012    RMR: Multiple colonic polyps removed, flat, tubular adenomas/ Left-sided diverticulosis  . Posterior lumbar vertebrae excision  2003; 2009; 2011  . Myringotomy      "3 times; both ears"  . Cataract extraction w/ intraocular lens  implant, bilateral    . Cholecystectomy  05/23/2012    Procedure: LAPAROSCOPIC CHOLECYSTECTOMY WITH INTRAOPERATIVE CHOLANGIOGRAM;  Surgeon: Stark Klein, MD;  Location: [facility];  Service: General;  Laterality: N/A;  . Esophagogastroduodenoscopy  02/02/12    FYB:OFBPZWCH size hiatal hernia; otherwise normal exam  . Colonoscopy  01/24/2013    LFY:BOFBPZW polyps/colonic diverticulosis  . Back surgery    . Portacath placement    . Lymph node biopsy      Social History:  reports that he quit smoking about 17 years ago. His smoking use included Cigarettes. He has a 57 pack-year smoking history. He has never used smokeless tobacco. He reports that he does not drink alcohol or use illicit drugs. lives alone Self-care  Allergies  Allergen Reactions    . Cymbalta [Duloxetine Hcl] Other (See Comments)    Confusion   . Procaine Hcl Hives and Other (See Comments)    NOVOCAINE: Sweating, Confusion, Not in right state of mind.  [name]    unknown    Family History  Problem Relation Age of Onset  . Colon cancer Neg Hx   . Liver disease Neg Hx   . Diabetes Mother      Prior to Admission medications   Medication Sig Start Date End Date Taking? Authorizing Tongela Encinas  albuterol (PROAIR HFA) 108 (90 BASE) MCG/ACT inhaler Inhale 2 puffs into the lungs every 6 (six) hours as needed. Shortness of Breath 08/25/15   Radene Gunning, NP  cetirizine (ZYRTEC) 10 MG tablet Take 10 mg by mouth daily.      Historical Shekina Cordell, MD  digoxin (LANOXIN) 0.125 MG tablet Take 1 tablet (0.125 mg total) by mouth daily. . 08/25/15   Radene Gunning, NP  diltiazem (CARDIZEM CD) 360 MG 24 hr capsule Take 1 capsule (360 mg total) by mouth daily. 05/25/15   Rexene Alberts, MD  Etanercept (ENBREL) 25 MG/0.5ML SOSY Inject into the skin 2 (two) times a week.     Historical Alette Kataoka, MD  ferrous sulfate 325 (65 FE) MG tablet Take 325 mg by mouth daily.    Historical Zakaree Mcclenahan, MD  folic acid (FOLVITE) 1 MG tablet Take 1 mg by mouth daily.     Historical Stephano Arrants, MD  furosemide (LASIX) 40 MG tablet Take 0.5 tablets (20 mg total) by mouth daily. Take daily for prevention of fluid buildup. 05/25/15   Rexene Alberts, MD  gabapentin (NEURONTIN) 300 MG capsule Take 2 capsules (600 mg total) by mouth 2 (two) times daily. 08/25/15   Radene Gunning, NP  ketoconazole (NIZORAL) 2 % shampoo Apply 1 application topically 3 (three) times a week.  05/21/13   Historical Tyhir Schwan, MD  methotrexate (RHEUMATREX) 2.5 MG tablet Take 7.5 mg by mouth 2 (two) times a week. *TAKE 3 TABLETS BY MOUTH TWICE WEEKLY ON FRIDAYS AND SATURDAYS*    Historical Ravon Mortellaro, MD  mometasone (NASONEX) 50 MCG/ACT nasal spray Place 2 sprays into the nose daily.    Historical Jordann Grime, MD   Omega-3 Fatty Acids (FISH OIL) 1000 MG CAPS Take 2 capsules by mouth 2 (two) times daily.     Historical Quinta Eimer, MD  omeprazole (PRILOSEC) 40 MG capsule Take 40 mg by mouth daily.    Historical Elaine Roanhorse, MD  oxyCODONE-acetaminophen (PERCOCET) 10-325 MG per tablet Take 1 tablet by mouth every 6 (six) hours as needed for pain. 09/11/15   Aviva Signs, MD  polyethylene glycol-electrolytes (NULYTELY/GOLYTELY) 420 G solution Take 4,000 mLs by mouth once. 09/07/15   Carlis Stable, NP  potassium chloride SA (K-DUR,KLOR-CON) 20 MEQ tablet Take 0.5 tablets (10 mEq total) by mouth daily. Starting 08/27/15. 08/25/15   Radene Gunning, NP  tadalafil (CIALIS) 5 MG tablet Take 5 mg by mouth daily. 05/18/15   Historical Roselynn Whitacre, MD  tamsulosin (  FLOMAX) 0.4 MG CAPS capsule Take 1 capsule (0.4 mg total) by mouth at bedtime. For prostate treatment. 05/25/15   Rexene Alberts, MD  Vitamin D, Ergocalciferol, (DRISDOL) 50000 UNITS CAPS capsule Take 1 capsule by mouth once a week. 04/27/15   Historical Rahaf Carbonell, MD   Physical Exam: Filed Vitals:   09/13/15 0700 09/13/15 0734 09/13/15 0738 09/13/15 0802  BP: 100/77 119/85    Pulse: 117 121    Temp:   98.3 F (36.8 C)   TempSrc:   Rectal   Resp: 12 22    Height:      Weight:      SpO2: 98% 97%  97%    VSS, 97% on 3L [place], afebrile General: Appears comfortable, calm. Eyes:  left pupil eccentric, artificial lens, pupil reactive, right pupil unremarkable ENT: grossly normal hearing, lips, tongue Neck: mild JVD present, No masses or thyromegaly Cardiovascular: Irregular rhythm. 2+ pitting edema to mid shin bilaterally, 2/6 hollow RUSB murmur, no R/G Respiratory: Clear to auscultation bilaterally, few wheezes,no rales or rhonchi. Normal respiratory effort. Abdomen: soft, mild generalized tenderness Skin: no rash or induration  Musculoskeletal: grossly normal tone bilateral upper and lower extremities Psychiatric: grossly normal mood and affect, speech fluent and  appropriate Neurologic: grossly non-focal.   Wt Readings from Last 3 Encounters:  09/13/15 89.812 kg (198 lb)  09/11/15 93.441 kg (206 lb)  09/09/15 93.441 kg (206 lb)    Labs on Admission:  Basic Metabolic Panel:  Recent Labs Lab 09/13/15 0650  NA 138  K 4.1  CL 102  CO2 28  GLUCOSE 81  BUN 11  CREATININE 0.85  CALCIUM 8.8*    Liver Function Tests:  Recent Labs Lab 09/13/15 0650  AST 37  ALT 20  ALKPHOS 58  BILITOT 2.6*  PROT 7.0  ALBUMIN 3.5    CBC:  Recent Labs Lab 09/13/15 0650  WBC 6.3  NEUTROABS 4.8  HGB 12.6*  HCT 37.8*  MCV 104.7*  PLT 63*    Cardiac Enzymes:  Recent Labs Lab 09/13/15 0650  TROPONINI <0.03     Radiological Exams on Admission: Dg Chest Port 1 View  09/13/2015   CLINICAL DATA:  Pt states woke up during the night with increased SOB, increased home 02, cough, thick sputum, ankles swollen bilaterally  EXAM: PORTABLE CHEST - 1 VIEW  COMPARISON:  09/11/2015, 09/09/2015  FINDINGS: Large right pleural effusion with underlying opacity. Mild cardiac enlargement. Coarsened lung markings. Known metastatic disease.  IMPRESSION: Large right pleural effusion with middle and lower lobe consolidation on the right. No change from recent prior studies.   Electronically Signed   By: Skipper Cliche M.D.   On: 09/13/2015 07:36   Dg Chest Port 1 View  09/11/2015   CLINICAL DATA:  Port-A-Cath placement  EXAM: DG C-ARM 1-60 MIN-NO REPORT; PORTABLE CHEST - 1 VIEW  COMPARISON:  PET-CT September 03, 2015  FINDINGS: Port-A-Cath tip is in the superior vena cava. No pneumothorax. There is a sizable right pleural effusion with right lower lobe and right middle lobe consolidation. Multiple small nodular lesions are noted consistent with metastatic disease, better seen on the PET-CT examination. Heart is upper normal in size with pulmonary vascular within normal limits. No adenopathy appreciable. No bone lesions appreciable.  IMPRESSION: Port-A-Cath in superior  vena cava. No pneumothorax. Evidence of small pulmonary nodular lesions, better seen on recent PET-CT. Sizable right effusion with right middle and lower lobe consolidation.   Electronically Signed   By: Lowella Grip III  M.D.   On: 09/11/2015 13:18   Dg C-arm 1-60 Min-no Report  09/11/2015   CLINICAL DATA: lymphadenopathy   C-ARM 1-60 MINUTES  Fluoroscopy was utilized by the requesting physician.  No radiographic  interpretation.       Active Problems:   HCAP (healthcare-associated pneumonia)   Assessment/Plan 1. COPD exacerbation 2. Acute CHF, likely diastolic. LVEF 04/2015 65-70%. 3. Possible HCAP, start broad spectrum abx 4. Elevated Lactic acid. Afebrile, no leukocytosis. Doubt sepsis.  5. Large right pleural effusion stable on CXR, fair air movement on exam. 6. Atrial fibrillation with RVR 7. Thrombocytopenia, acute on chronic 8. Moderate aortic stenosis 9. Metastatic lymphadenopathy, right lung mass followed by oncology. Lymph node biopsy pending. S/p recent port-a-cath placement without evidence of complicating features. 10. Rheumatoid arthritis on MTX and etanercept.   Presents with acute on chronic SOB, based on intitial evaluation and exam favor COPD, CHF, complicated by chronic effusion and afib RVR; think HCAP less likely.  Appears ill but not toxic, admit to SDU. Mild elevated lactic acid but doubt sepsis at this point, favor dehydration.   Steroids, Nebs, abx  Start diuretics  Trend lactic acid  Thoracentesis 9/19  Monitor HR, continue digoxin and diltiazem  Will notify oncology of admission. D/w Dr. Iona Coach to use port-a-cath   Code Status:Full  DVT prophylaxis: Xarelto Family Communication: Son at bedside. Discussed with patient and family who understands and has no concerns at this time. Disposition Plan/Anticipated LOS: Admit to medical bed for further management. Anticipated LOS 2-3 days.  Time spent: 55 minutes  ADDENDUM 1839 Lactic acid  >>3>>4 Procalcitonin <0.10 Patient alert, feels "much better", breathing better. Afebrile, VSS, stable SpO2 on [place] CTA Both feet warm with grossly normal perfusion  Clinical condition does not correlate with elevated lactate and sepsis is not suspected.  Discussed with Dr. Melvyn Novas PCCM via [facility], he recommends stopping serial lactate, following clinically and keeping hydrated. I concur.   Murray Hodgkins, MD  [facility] Pager [phone] 09/13/2015, 8:24 AM   By signing my name below, I, Rosalie Doctor attest that this documentation has been prepared under the direction and in the presence of Murray Hodgkins, MD Electronically signed: Rosalie Doctor, Scribe. 09/13/2015  I personally performed the services described in this documentation. All medical record entries made by the scribe were at my direction. I have reviewed the chart and agree that the record reflects my personal performance and is accurate and complete. Murray Hodgkins, MD

## 2015-09-13 NOTE — ED Notes (Signed)
Pt states woke up during the night with increased SOB, increased home 02, cough, thick sputum, ankles swollen bilaterally

## 2015-09-13 NOTE — Progress Notes (Signed)
Dr. Sarajane Jews aware of Lactic acid 4.00

## 2015-09-13 NOTE — ED Notes (Signed)
Pt using BSC. 

## 2015-09-13 NOTE — Progress Notes (Signed)
ANTIBIOTIC CONSULT NOTE - INITIAL  Pharmacy Consult for vancomycin, renal adjust antibiotics Indication: pneumonia  Allergies  Allergen Reactions  . Cymbalta [Duloxetine Hcl] Other (See Comments)    Confusion   . Procaine Hcl Hives and Other (See Comments)    NOVOCAINE: Sweating, Confusion, Not in right state of mind.  [name]    unknown    Patient Measurements: Height: 5\' 8"  (172.7 cm) Weight: 198 lb (89.812 kg) IBW/kg (Calculated) : 68.4   Vital Signs: Temp: 98.3 F (36.8 C) (09/18 0738) Temp Source: Rectal (09/18 0738) BP: 118/76 mmHg (09/18 0800) Pulse Rate: 114 (09/18 0800) Intake/Output from previous day:   Intake/Output from this shift:    Labs:  Recent Labs  09/13/15 0650  WBC 6.3  HGB 12.6*  PLT 63*  CREATININE 0.85   Estimated Creatinine Clearance: 73 mL/min (by C-G formula based on Cr of 0.85). No results for input(s): VANCOTROUGH, VANCOPEAK, VANCORANDOM, GENTTROUGH, GENTPEAK, GENTRANDOM, TOBRATROUGH, TOBRAPEAK, TOBRARND, AMIKACINPEAK, AMIKACINTROU, AMIKACIN in the last 72 hours.   Microbiology: Recent Results (from the past 720 hour(s))  MRSA PCR Screening     Status: Abnormal   Collection Time: 08/23/15  9:11 AM  Result Value Ref Range Status   MRSA by PCR POSITIVE (A) NEGATIVE Final    Comment:        The GeneXpert MRSA Assay (FDA approved for NASAL specimens only), is one component of a comprehensive MRSA colonization surveillance program. It is not intended to diagnose MRSA infection nor to guide or monitor treatment for MRSA infections. RESULT CALLED TO, READ BACK BY AND VERIFIED WITH: BULLINS,M AT 1118 ON 08/23/15 BY MOSLEY,J   Culture, body fluid-bottle     Status: None   Collection Time: 08/24/15  3:05 PM  Result Value Ref Range Status   Specimen Description FLUID RIGHT PLEURAL  Final   Special Requests BOTTLES DRAWN AEROBIC AND ANAEROBIC 10CC EACH  Final   Culture NO GROWTH 5 DAYS  Final    Report Status 08/29/2015 FINAL  Final  Gram stain     Status: None   Collection Time: 08/24/15  3:05 PM  Result Value Ref Range Status   Specimen Description FLUID RIGHT PLEURAL  Final   Special Requests NONE  Final   Gram Stain   Final    CYTOSPIN SMEAR WBC PRESENT,BOTH PMN AND MONONUCLEAR NO ORGANISMS SEEN Performed at [facility]    Report Status 08/24/2015 FINAL  Final    Medical History: Past Medical History  Diagnosis Date  . COPD (chronic obstructive pulmonary disease)     Uses occasional nighttime O2  . Disseminated herpes zoster 2010  . GERD (gastroesophageal reflux disease)   . Asthma   . Pulmonary embolus     2003 and 2011  . Pulmonary nodules     Chest CT 09/11  . Mixed hyperlipidemia   . Chronic atrial fibrillation   . Lupus anticoagulant positive   . Essential hypertension, benign   . Cholelithiasis   . Rheumatoid arthritis(714.0)   . History of chicken pox 1941; 2011  . Anemia   . Chronic lower back pain   . Chronic diastolic heart failure   . Cirrhosis     Questionable, AFP normal Feb 01/2012, hx of ETOH use   . Headache(784.0)   . Cervical spondylosis without myelopathy   . Stage III chronic kidney disease   . Cellulitis of leg, left 01/03/2015  . Aortic stenosis, moderate 05/21/2015  . Dysrhythmia  chronic AFib    Medications:  Prescriptions prior to admission  Medication Sig Dispense Refill Last Dose  . albuterol (PROAIR HFA) 108 (90 BASE) MCG/ACT inhaler Inhale 2 puffs into the lungs every 6 (six) hours as needed. Shortness of Breath 1 Inhaler 3 09/11/2015 at 0800  . cetirizine (ZYRTEC) 10 MG tablet Take 10 mg by mouth daily.     09/10/2015 at 2100  . digoxin (LANOXIN) 0.125 MG tablet Take 1 tablet (0.125 mg total) by mouth daily. .   09/10/2015 at 0800  . diltiazem (CARDIZEM CD) 360 MG 24 hr capsule Take 1 capsule (360 mg total) by mouth daily. 30 capsule 3 09/10/2015 at 2100  . Etanercept (ENBREL) 25 MG/0.5ML SOSY Inject into the skin 2  (two) times a week.    09/04/2015 at Unknown time  . ferrous sulfate 325 (65 FE) MG tablet Take 325 mg by mouth daily.   Past Week at Unknown time  . folic acid (FOLVITE) 1 MG tablet Take 1 mg by mouth daily.    09/10/2015 at Unknown time  . furosemide (LASIX) 40 MG tablet Take 0.5 tablets (20 mg total) by mouth daily. Take daily for prevention of fluid buildup.   Past Week at Unknown time  . gabapentin (NEURONTIN) 300 MG capsule Take 2 capsules (600 mg total) by mouth 2 (two) times daily. 30 capsule 0 09/10/2015 at Unknown time  . ketoconazole (NIZORAL) 2 % shampoo Apply 1 application topically 3 (three) times a week.    09/10/2015 at Unknown time  . methotrexate (RHEUMATREX) 2.5 MG tablet Take 7.5 mg by mouth 2 (two) times a week. *TAKE 3 TABLETS BY MOUTH TWICE WEEKLY ON FRIDAYS AND SATURDAYS*   09/10/2015 at Unknown time  . mometasone (NASONEX) 50 MCG/ACT nasal spray Place 2 sprays into the nose daily.   09/10/2015 at Unknown time  . Omega-3 Fatty Acids (FISH OIL) 1000 MG CAPS Take 2 capsules by mouth 2 (two) times daily.    09/10/2015 at Unknown time  . omeprazole (PRILOSEC) 40 MG capsule Take 40 mg by mouth daily.   09/10/2015 at Unknown time  . oxyCODONE-acetaminophen (PERCOCET) 10-325 MG per tablet Take 1 tablet by mouth every 6 (six) hours as needed for pain. 30 tablet 0   . polyethylene glycol-electrolytes (NULYTELY/GOLYTELY) 420 G solution Take 4,000 mLs by mouth once. 4000 mL 0   . potassium chloride SA (K-DUR,KLOR-CON) 20 MEQ tablet Take 0.5 tablets (10 mEq total) by mouth daily. Starting 08/27/15.   09/10/2015 at Unknown time  . tadalafil (CIALIS) 5 MG tablet Take 5 mg by mouth daily.   Past Week at Unknown time  . tamsulosin (FLOMAX) 0.4 MG CAPS capsule Take 1 capsule (0.4 mg total) by mouth at bedtime. For prostate treatment. 30 capsule 3 09/10/2015 at Unknown time  . Vitamin D, Ergocalciferol, (DRISDOL) 50000 UNITS CAPS capsule Take 1 capsule by mouth once a week.  4 Past Week at Unknown time    Assessment: 81 yo man to start broad spectrum antibiotics for HCAP, r/o sepsis.  Vanc 1 gm IV X 1 given.  Fortaz 2gm IV q8 hours ordered.  Goal of Therapy:  Vancomycin trough level 15-20 mcg/ml  Plan:  Give an additional vancomycin 750 mg IV X 1 for total load of 1750 mg Cont vanc 1gm IV q12 hours F/u renal function, cultures and clinical course Check vanc trough when appropriate.  Thanks for allowing pharmacy to be a part of this patient's care.  Excell Seltzer, PharmD Clinical Pharmacist  09/13/2015,9:44 AM

## 2015-09-14 ENCOUNTER — Encounter (HOSPITAL_COMMUNITY): Payer: Self-pay | Admitting: General Surgery

## 2015-09-14 ENCOUNTER — Inpatient Hospital Stay (HOSPITAL_COMMUNITY): Payer: Medicare Other

## 2015-09-14 LAB — BASIC METABOLIC PANEL
ANION GAP: 7 (ref 5–15)
BUN: 14 mg/dL (ref 6–20)
CALCIUM: 8.4 mg/dL — AB (ref 8.9–10.3)
CO2: 30 mmol/L (ref 22–32)
Chloride: 103 mmol/L (ref 101–111)
Creatinine, Ser: 1.14 mg/dL (ref 0.61–1.24)
GFR, EST NON AFRICAN AMERICAN: 58 mL/min — AB (ref >60)
Glucose, Bld: 191 mg/dL — ABNORMAL HIGH (ref 65–99)
Potassium: 3.7 mmol/L (ref 3.5–5.1)
SODIUM: 140 mmol/L (ref 135–145)

## 2015-09-14 LAB — STREP PNEUMONIAE URINARY ANTIGEN: Strep Pneumo Urinary Antigen: NEGATIVE

## 2015-09-14 MED ORDER — OXYCODONE-ACETAMINOPHEN 5-325 MG PO TABS
1.0000 | ORAL_TABLET | Freq: Four times a day (QID) | ORAL | Status: DC
  Administered 2015-09-14 – 2015-09-18 (×17): 1 via ORAL
  Filled 2015-09-14 (×18): qty 1

## 2015-09-14 MED ORDER — OXYCODONE HCL 5 MG PO TABS
5.0000 mg | ORAL_TABLET | Freq: Four times a day (QID) | ORAL | Status: DC
  Administered 2015-09-14 – 2015-09-18 (×17): 5 mg via ORAL
  Filled 2015-09-14 (×17): qty 1

## 2015-09-14 MED ORDER — PREDNISONE 20 MG PO TABS
40.0000 mg | ORAL_TABLET | Freq: Every day | ORAL | Status: DC
  Administered 2015-09-15 – 2015-09-16 (×2): 40 mg via ORAL
  Filled 2015-09-14 (×2): qty 2

## 2015-09-14 MED ORDER — DEXTROSE 5 % IV SOLN
INTRAVENOUS | Status: AC
  Filled 2015-09-14: qty 2

## 2015-09-14 NOTE — Progress Notes (Addendum)
PROGRESS NOTE  [name] YDX:[id number] DOB: 03/01/1931 DOA: 09/13/2015 PCP: [name], MD  Summary: 18 yom with history of COPD, on 2L O2 at home, A. fib, pulmonary embolus in 2003 and in 2011, and widespread metastatic lymphadenopathy presented with SOB, a productive cough, and BLE swelling. Patient underwent a Port-A-Cath placement in the left chest wall and a right inguinal lymph node biopsy by Dr. Arnoldo Morale, general surgery, on 09/11/15. CXR in the ED revealed a large right pleural effusion with middle and lower lobe consolidation on the right however there was no change from recent prior studies. Initial labs revealed elevated BNP at 595 but were otherwise unremarkable. Acute issues felt to be COPD exacerbation, possible HCAP, afib with RVR.  Assessment/Plan: 1. COPD exacerbation, improving, no wheezing, good air movement.  2. Possible HCAP, afebrile, no hypoxia. Lactic acid was elevated but after discussion with PCCM, it did not correlate with clinical condition therefore no further trending was continued. No evidence of sepsis. BC pending. 3. Large right pleural effusion, stable on CXR. Plan for thoracentesis today 4. Atrial fibrillation with RVR, rate controlled with Diltiazem.  5. Thrombocytopenia, acute on chronic. 6. Chronic diastolic heart failure, compensated and stable. LVEF 04/2015 65-70%. 7. Moderate aortic stenosis 8. Metastatic lymphadenopathy, right lung mass followed by oncology. Lymph node biopsy pending. S/p recent port-a-cath placement without evidence of complicating features. 9. Rheumatoid arthritis on MTX and etanercept.   Appears improved, hemodynamics stable, I/O matched  Continue abx, oral diuretics, cardiac medications  CBC in AM  Thoracentesis today  Likely transfer to medical bed later today. Anticipate discharge within 48 hours.   Code Status:Full DVT prophylaxis: SCDs (low platelets) Family Communication: None at the bedside. Discussed  with patient who understands and has no concerns at this time. Disposition Plan/Anticipated LOS: Anticipate discharge within 48 hours.   Murray Hodgkins, MD  [facility]  Pager [phone] If 7PM-7AM, please contact night-coverage at [email or web address], password [facility] 09/14/2015, 6:18 AM  LOS: 1 day   Consultants:    Procedures:  Thoracentesis 9/19  Antibiotics:  Vancomycin 9/18>>  HPI/Subjective: Able to sleep last night. Breathing is improved but cough persists. Denies nausea, vomiting. Complains of intermittent abdominal pain, and chronic back pain. Able to eat this morning.   Objective: Filed Vitals:   09/14/15 0200 09/14/15 0300 09/14/15 0400 09/14/15 0500  BP: 98/51 104/48 116/65 98/45  Pulse: 76 84 102 76  Temp:   98.1 F (36.7 C)   TempSrc:   Oral   Resp: 11 12 15 16   Height:      Weight:    91.6 kg (201 lb 15.1 oz)  SpO2: 97% 97% 96% 98%    Intake/Output Summary (Last 24 hours) at 09/14/15 0618 Last data filed at 09/14/15 0500  Gross per 24 hour  Intake 3312.08 ml  Output   3200 ml  Net 112.08 ml     Filed Weights   09/13/15 0613 09/13/15 0943 09/14/15 0500  Weight: 89.812 kg (198 lb) 91.5 kg (201 lb 11.5 oz) 91.6 kg (201 lb 15.1 oz)    Exam:    Not hypoxic, afebrile, borderline hypotension General:  Appears calm and comfortable Eyes:  normal lids, irises & conjunctiva, left pupil eccentric and reactive ENT: grossly normal hearing, lips & tongue Cardiovascular: Irregular rate, 2/6 holosystolic murmur RUSB, 9-6+ pitting edema BLE L>R.  Telemetry: atrial fibrillation Respiratory: CTA bilaterally, no w/r/r. Normal respiratory effort. Abdomen: soft, ntnd Musculoskeletal: grossly normal tone BUE/BLE Psychiatric: grossly normal mood and affect, speech  fluent and appropriate Neurologic: grossly non-focal.   New data reviewed:  UOP 1300+, BM x2  BMP unremarkable  Pertinent data since admission:  CXR IMPRESSION: Large right pleural effusion  with middle and lower lobe consolidation on the right. No change from recent prior studies.  Pending data:  BC  Sputum culture  Scheduled Meds: . aspirin EC  81 mg Oral Daily  . cefTAZidime (FORTAZ)  IV  2 g Intravenous 3 times per day  . digoxin  0.125 mg Oral Daily  . diltiazem  360 mg Oral Daily  . enoxaparin (LOVENOX) injection  40 mg Subcutaneous Q24H  . gabapentin  600 mg Oral BID  . ipratropium-albuterol  3 mL Nebulization Q6H  . loratadine  10 mg Oral Daily  . methylPREDNISolone (SOLU-MEDROL) injection  40 mg Intravenous Q12H  . pantoprazole  40 mg Oral Daily  . sodium chloride  3 mL Intravenous Q12H  . tamsulosin  0.4 mg Oral QHS  . vancomycin  1,000 mg Intravenous Q12H   Continuous Infusions: . sodium chloride 75 mL/hr at 09/14/15 0400    Principal Problem:   COPD with acute exacerbation Active Problems:   Aortic stenosis, moderate   Acute diastolic CHF (congestive heart failure)   Atrial fibrillation with RVR   HCAP (healthcare-associated pneumonia)   Time spent 35 minutes   By signing my name below, I, Rosalie Doctor attest that this documentation has been prepared under the direction and in the presence of Murray Hodgkins, MD Electronically signed: Rosalie Doctor, Scribe.  09/14/2015  I personally performed the services described in this documentation. All medical record entries made by the scribe were at my direction. I have reviewed the chart and agree that the record reflects my personal performance and is accurate and complete. Murray Hodgkins, MD

## 2015-09-14 NOTE — Care Management Note (Signed)
Case Management Note  Patient Details  Name: [name] MRN: [id number] Date of Birth: 01/07/1931  Expected Discharge Date:    09/16/2015              Expected Discharge Plan:  Home/Self Care  In-House Referral:  NA  Discharge planning Services  CM Consult  Post Acute Care Choice:  NA Choice offered to:  NA  DME Arranged:    DME Agency:     HH Arranged:    HH Agency:     Status of Service:  In process, will continue to follow  Medicare Important Message Given:    Date Medicare IM Given:    Medicare IM give by:    Date Additional Medicare IM Given:    Additional Medicare Important Message give by:     If discussed at [place] of Stay Meetings, dates discussed:    Additional Comments: Pt is from home, lives alone and is independent at baseline. Pt uses a cane with ambulation and has home O2 and neb machine from [facility]. Pt plans to return home with self care at DC. No CM needs anticipated, will cont to follow.  Sherald Barge, RN 09/14/2015, 2:01 PM

## 2015-09-15 ENCOUNTER — Ambulatory Visit (HOSPITAL_COMMUNITY): Payer: Medicare Other | Admitting: Hematology & Oncology

## 2015-09-15 ENCOUNTER — Inpatient Hospital Stay (HOSPITAL_COMMUNITY): Payer: Medicare Other

## 2015-09-15 DIAGNOSIS — J90 Pleural effusion, not elsewhere classified: Secondary | ICD-10-CM

## 2015-09-15 LAB — LEGIONELLA ANTIGEN, URINE

## 2015-09-15 LAB — BODY FLUID CELL COUNT WITH DIFFERENTIAL
EOS FL: 0 %
Lymphs, Fluid: 66 %
Monocyte-Macrophage-Serous Fluid: 19 % — ABNORMAL LOW (ref 50–90)
Neutrophil Count, Fluid: 15 % (ref 0–25)
WBC FLUID: 628 uL (ref 0–1000)

## 2015-09-15 LAB — BASIC METABOLIC PANEL
ANION GAP: 6 (ref 5–15)
BUN: 16 mg/dL (ref 6–20)
CALCIUM: 8.3 mg/dL — AB (ref 8.9–10.3)
CO2: 29 mmol/L (ref 22–32)
Chloride: 105 mmol/L (ref 101–111)
Creatinine, Ser: 1.02 mg/dL (ref 0.61–1.24)
GLUCOSE: 111 mg/dL — AB (ref 65–99)
POTASSIUM: 3.8 mmol/L (ref 3.5–5.1)
SODIUM: 140 mmol/L (ref 135–145)

## 2015-09-15 LAB — CBC
HCT: 33.5 % — ABNORMAL LOW (ref 39.0–52.0)
HEMOGLOBIN: 11.1 g/dL — AB (ref 13.0–17.0)
MCH: 34.7 pg — ABNORMAL HIGH (ref 26.0–34.0)
MCHC: 33.1 g/dL (ref 30.0–36.0)
MCV: 104.7 fL — ABNORMAL HIGH (ref 78.0–100.0)
Platelets: 61 10*3/uL — ABNORMAL LOW (ref 150–400)
RBC: 3.2 MIL/uL — AB (ref 4.22–5.81)
RDW: 17.5 % — ABNORMAL HIGH (ref 11.5–15.5)
WBC: 5.6 10*3/uL (ref 4.0–10.5)

## 2015-09-15 LAB — GRAM STAIN

## 2015-09-15 LAB — VANCOMYCIN, TROUGH: VANCOMYCIN TR: 24 ug/mL — AB (ref 10.0–20.0)

## 2015-09-15 MED ORDER — IPRATROPIUM-ALBUTEROL 0.5-2.5 (3) MG/3ML IN SOLN
3.0000 mL | Freq: Three times a day (TID) | RESPIRATORY_TRACT | Status: DC
  Administered 2015-09-15 – 2015-09-18 (×10): 3 mL via RESPIRATORY_TRACT
  Filled 2015-09-15 (×10): qty 3

## 2015-09-15 NOTE — Progress Notes (Addendum)
PROGRESS NOTE  [name] FKC:[id number] DOB: 04-14-1931 DOA: 09/13/2015 PCP: [name], MD  Summary: 26 yom with history of COPD, on 2L O2 at home, A. fib, pulmonary embolus in 2003 and in 2011, and widespread metastatic lymphadenopathy presented with SOB, a productive cough, and BLE swelling. Patient underwent a Port-A-Cath placement in the left chest wall and a right inguinal lymph node biopsy by Dr. Arnoldo Morale, general surgery, on 09/11/15. CXR in the ED revealed a large right pleural effusion with middle and lower lobe consolidation on the right however there was no change from recent prior studies. Initial labs revealed elevated BNP at 595 but were otherwise unremarkable. Acute issues felt to be COPD exacerbation, possible HCAP, afib with RVR.  Assessment/Plan: 1. COPD exacerbation, improving, no wheezing, good air movement. On steroids/abx/nebs, no wheezing today on 9/20. 2. Possible HCAP, afebrile, no hypoxia. Lactic acid was elevated but after discussion with PCCM, it did not correlate with clinical condition therefore no further trending was continued. No evidence of sepsis. BC pending. 3. Recurrent Large right pleural effusion, stable on CXR. Plan for thoracentesis today. (Last thoracentesis on 8/29) 4. Atrial fibrillation with RVR, rate controlled with Diltiazem/digoxin. Not a candidate for anticoagulation due to thrombocytopenia. 5. Thrombocytopenia, acute on chronic. 6. Chronic diastolic heart failure, compensated and stable. LVEF 04/2015 65-70%. No lower extremity edema 7. Moderate aortic stenosis 8. Metastatic lymphadenopathy, right lung mass followed by oncology. Lymph node biopsy pending. S/p recent port-a-cath placement on 9/18 by Dr. Arnoldo Morale. 9. Rheumatoid arthritis on MTX and etanercept.   Appears improved, hemodynamics stable, I/O matched  Continue abx, oral diuretics, cardiac medications  CBC in AM  Thoracentesis 9/20  Oncology consulted.  Code  Status:Full DVT prophylaxis: SCDs (low platelets) Family Communication:  Discussed with patient and daughter who understands and has no concerns at this time. Disposition Plan/Anticipated LOS: pending oncology input. Stable after thoracentesis, transfer to med tele.  Florencia Reasons, MD PhD [facility]  Pager [phone] If 7PM-7AM, please contact night-coverage at [email or web address], password [facility] 09/15/2015, 3:17 PM  LOS: 2 days   Consultants: oncology  Procedures:  Thoracentesis 9/20  Antibiotics:  Vancomycin 9/18>>  Zosyn x1 on 9/18  fortaz from 9/18  HPI/Subjective: Sitting in bed, awaiting thoracentesis, some intermittent dry cough, reported chronic sob, daughter in room.  Objective: Filed Vitals:   09/15/15 1000 09/15/15 1100 09/15/15 1200 09/15/15 1357  BP: 103/51 99/49 123/61   Pulse:      Temp:      TempSrc:      Resp: 12 11 16    Height:      Weight:      SpO2: 95% 94% 92% 98%    Intake/Output Summary (Last 24 hours) at 09/15/15 1517 Last data filed at 09/15/15 1200  Gross per 24 hour  Intake   1750 ml  Output   1700 ml  Net     50 ml     Filed Weights   09/13/15 0943 09/14/15 0500 09/15/15 0454  Weight: 201 lb 11.5 oz (91.5 kg) 201 lb 15.1 oz (91.6 kg) 208 lb 8.9 oz (94.6 kg)    Exam:    Not hypoxic, afebrile, borderline hypotension General:  Appears calm and comfortable Eyes:  normal lids, irises & conjunctiva, left pupil eccentric and reactive ENT: grossly normal hearing, lips & tongue Cardiovascular: Irregular rate, 2/6 holosystolic murmur RUSB, 7-5+ pitting edema BLE L>R.  Telemetry: atrial fibrillation Respiratory: decreased right lower lung fields, no wheezing. Normal respiratory effort. Abdomen: soft, ntnd Musculoskeletal:  grossly normal tone BUE/BLE Psychiatric: grossly normal mood and affect, speech fluent and appropriate Neurologic: grossly non-focal.   New data reviewed:  UOP 1300+, BM x2  BMP unremarkable  Pertinent data since  admission:  CXR IMPRESSION: Large right pleural effusion with middle and lower lobe consolidation on the right. No change from recent prior studies.  Pending data:  BC  Sputum culture  Scheduled Meds: . aspirin EC  81 mg Oral Daily  . cefTAZidime (FORTAZ)  IV  2 g Intravenous 3 times per day  . digoxin  0.125 mg Oral Daily  . diltiazem  360 mg Oral Daily  . gabapentin  600 mg Oral BID  . ipratropium-albuterol  3 mL Nebulization TID  . loratadine  10 mg Oral Daily  . oxyCODONE-acetaminophen  1 tablet Oral Q6H   And  . oxyCODONE  5 mg Oral Q6H  . pantoprazole  40 mg Oral Daily  . predniSONE  40 mg Oral Q breakfast  . sodium chloride  3 mL Intravenous Q12H  . tamsulosin  0.4 mg Oral QHS  . vancomycin  1,000 mg Intravenous Q12H   Continuous Infusions:    Principal Problem:   COPD with acute exacerbation Active Problems:   Aortic stenosis, moderate   Acute diastolic CHF (congestive heart failure)   Atrial fibrillation with RVR   HCAP (healthcare-associated pneumonia)   Time spent 35 minutes

## 2015-09-15 NOTE — Progress Notes (Signed)
Patient upset that his pain medication is not due until 11 PM, explained to that his medication was not due until 11:30 PM and that I would go ahead and give it to him early this time.  Patient still agitated stating "No I'll just take it at 11:30." Told the patient I would return at 11:30 with his medications and exited the room.

## 2015-09-16 ENCOUNTER — Encounter (HOSPITAL_COMMUNITY): Payer: Self-pay | Admitting: Oncology

## 2015-09-16 ENCOUNTER — Other Ambulatory Visit (HOSPITAL_COMMUNITY): Payer: Self-pay | Admitting: Oncology

## 2015-09-16 DIAGNOSIS — J90 Pleural effusion, not elsewhere classified: Secondary | ICD-10-CM | POA: Diagnosis present

## 2015-09-16 DIAGNOSIS — I5032 Chronic diastolic (congestive) heart failure: Secondary | ICD-10-CM

## 2015-09-16 LAB — BASIC METABOLIC PANEL
ANION GAP: 4 — AB (ref 5–15)
BUN: 17 mg/dL (ref 6–20)
CALCIUM: 8.4 mg/dL — AB (ref 8.9–10.3)
CO2: 29 mmol/L (ref 22–32)
CREATININE: 1.04 mg/dL (ref 0.61–1.24)
Chloride: 106 mmol/L (ref 101–111)
GFR calc non Af Amer: >60 mL/min (ref >60)
Glucose, Bld: 97 mg/dL (ref 65–99)
Potassium: 3.8 mmol/L (ref 3.5–5.1)
SODIUM: 139 mmol/L (ref 135–145)

## 2015-09-16 MED ORDER — METHYLPREDNISOLONE SODIUM SUCC 125 MG IJ SOLR
60.0000 mg | Freq: Four times a day (QID) | INTRAMUSCULAR | Status: DC
  Administered 2015-09-16 – 2015-09-18 (×9): 60 mg via INTRAVENOUS
  Filled 2015-09-16 (×9): qty 2

## 2015-09-16 MED ORDER — ONDANSETRON HCL 8 MG PO TABS: ORAL_TABLET | ORAL | Status: DC

## 2015-09-16 MED ORDER — ALLOPURINOL 300 MG PO TABS
300.0000 mg | ORAL_TABLET | Freq: Every day | ORAL | Status: DC
  Administered 2015-09-16 – 2015-09-18 (×3): 300 mg via ORAL
  Filled 2015-09-16 (×3): qty 1

## 2015-09-16 MED ORDER — VANCOMYCIN HCL IN DEXTROSE 750-5 MG/150ML-% IV SOLN
750.0000 mg | Freq: Two times a day (BID) | INTRAVENOUS | Status: DC
  Administered 2015-09-16: 750 mg via INTRAVENOUS
  Filled 2015-09-16 (×5): qty 150

## 2015-09-16 MED ORDER — GUAIFENESIN ER 600 MG PO TB12
1200.0000 mg | ORAL_TABLET | Freq: Two times a day (BID) | ORAL | Status: DC
  Administered 2015-09-16 – 2015-09-18 (×5): 1200 mg via ORAL
  Filled 2015-09-16 (×5): qty 2

## 2015-09-16 MED ORDER — ALLOPURINOL 300 MG PO TABS: 300.0000 mg | ORAL_TABLET | Freq: Every day | ORAL | Status: DC

## 2015-09-16 MED ORDER — VANCOMYCIN HCL IN DEXTROSE 1-5 GM/200ML-% IV SOLN
1000.0000 mg | Freq: Once | INTRAVENOUS | Status: AC
  Administered 2015-09-16: 1000 mg via INTRAVENOUS
  Filled 2015-09-16: qty 200

## 2015-09-16 MED ORDER — PROCHLORPERAZINE MALEATE 10 MG PO TABS: 10.0000 mg | ORAL_TABLET | Freq: Four times a day (QID) | ORAL | Status: DC | PRN

## 2015-09-16 NOTE — Progress Notes (Signed)
[facility] PROGRESS NOTE  [name] EVO:[id number] DOB: Jul 06, 1931 DOA: 09/13/2015 PCP: [name], MD  Assessment/Plan:  COPD exacerbation, improving. Continues to complain of wheezing and feels more SOB today. Will change prednisone to Solu-Medrol. Continue nebs and abx.    Chronic respiratory failure with hypoxia likely related to COPD. Continue supplemental O2.   Possible HCAP, remains afebrile with no hypoxia. Lactic acid was elevated but after discussion with PCCM, it did not correlate with clinical condition therefore no further trending was continued. No evidence of sepsis. BC has shown no growth in two days. Will deescalate abx as his clinical conditions improve.  Recurrent Large right pleural effusion, stable on CXR. Thoracentesis on 9/20 with removal of 2L of fluids. Follow up pathology results. Per patient last thoracentesis was appr. 3 weeks ago. ? Need for pleurx catheter. Will await input from oncology.   Atrial fibrillation with RVR, rate controlled with Diltiazem/digoxin. Not a candidate for anticoagulation due to thrombocytopenia.  Thrombocytopenia, acute on chronic. Platelet count is at baseline.   Chronic diastolic heart failure, compensated and stable. LVEF 04/2015 65-70%. No lower extremity edema  Moderate aortic stenosis  Metastatic lymphadenopathy, right lung mass followed by oncology. Lymph node biopsy pending. S/p recent port-a-cath placement on 9/18 by Dr. Arnoldo Morale. Oncology has been consulted.   Rheumatoid arthritis on MTX and etanercept.  Code Status: Full DVT prophylaxis: SCD Family Communication: Discussed with patient who understands and has no concerns at this time. Disposition Plan: Discharge home within 1-2 days.  Consultants:  Oncology  Procedures:  Thoracentesis 9/20 with removal of 2L fluids.   Antibiotics:  Vancomycin 9/18>>  Zosyn x1 on 9/18>>  fortaz from 9/18>>  HPI/Subjective: Is doing better s/p  thoracentesis.  Patient is aware he has cancer but has not been told the type at this time. Fluid build up onset about 3-4 weeks ago.  Dry cough and breathing seems worse then baseline but denies chest pain and wheezing. Back and shoulder pain are chronic.   Objective: Filed Vitals:   09/16/15 0528  BP: 121/55  Pulse: 78  Temp: 97.6 F (36.4 C)  Resp: 20    Intake/Output Summary (Last 24 hours) at 09/16/15 0933 Last data filed at 09/16/15 2993  Gross per 24 hour  Intake   1080 ml  Output    300 ml  Net    780 ml   Filed Weights   09/14/15 0500 09/15/15 0454 09/16/15 0500  Weight: 91.6 kg (201 lb 15.1 oz) 94.6 kg (208 lb 8.9 oz) 94.6 kg (208 lb 8.9 oz)    Exam: General: NAD, looks comfortable. Sitting up in bed, able to speak in full sentences.  Cardiovascular: RRR, S1, S2  Respiratory: Fair air movement bilaterally with mild expiratory wheezes. No rales or rhnochi Abdomen: soft, non tender, no distention , bowel sounds normal Musculoskeletal: No edema b/l   Data Reviewed: Basic Metabolic Panel:  Recent Labs Lab 09/13/15 0650 09/14/15 0531 09/15/15 0510 09/16/15 0601  NA 138 140 140 139  K 4.1 3.7 3.8 3.8  CL 102 103 105 106  CO2 28 30 29 29   GLUCOSE 81 191* 111* 97  BUN 11 14 16 17   CREATININE 0.85 1.14 1.02 1.04  CALCIUM 8.8* 8.4* 8.3* 8.4*   Liver Function Tests:  Recent Labs Lab 09/13/15 0650  AST 37  ALT 20  ALKPHOS 58  BILITOT 2.6*  PROT 7.0  ALBUMIN 3.5   No results for input(s): LIPASE, AMYLASE in the last 168 hours.  No results for input(s): AMMONIA in the last 168 hours. CBC:  Recent Labs Lab 09/13/15 0650 09/15/15 0510  WBC 6.3 5.6  NEUTROABS 4.8  --   HGB 12.6* 11.1*  HCT 37.8* 33.5*  MCV 104.7* 104.7*  PLT 63* 61*   Cardiac Enzymes:  Recent Labs Lab 09/13/15 0650  TROPONINI <0.03   BNP (last 3 results)  Recent Labs  05/20/15 0533 08/22/15 1421 09/13/15 0650  BNP 382.0* 358.0* 595.0*    ProBNP (last 3 results) No  results for input(s): PROBNP in the last 8760 hours.  CBG: No results for input(s): GLUCAP in the last 168 hours.  Recent Results (from the past 240 hour(s))  Blood culture (routine x 2)     Status: None (Preliminary result)   Collection Time: 09/13/15  7:35 AM  Result Value Ref Range Status   Specimen Description BLOOD RIGHT ANTECUBITAL  Final   Special Requests BOTTLES DRAWN AEROBIC AND ANAEROBIC 6CC EACH  Final   Culture NO GROWTH 2 DAYS  Final   Report Status PENDING  Incomplete  Blood culture (routine x 2)     Status: None (Preliminary result)   Collection Time: 09/13/15  7:43 AM  Result Value Ref Range Status   Specimen Description BLOOD LEFT ANTECUBITAL  Final   Special Requests BOTTLES DRAWN AEROBIC AND ANAEROBIC 6CC EACH  Final   Culture NO GROWTH 2 DAYS  Final   Report Status PENDING  Incomplete  MRSA PCR Screening     Status: None   Collection Time: 09/13/15 12:00 PM  Result Value Ref Range Status   MRSA by PCR NEGATIVE NEGATIVE Final    Comment:        The GeneXpert MRSA Assay (FDA approved for NASAL specimens only), is one component of a comprehensive MRSA colonization surveillance program. It is not intended to diagnose MRSA infection nor to guide or monitor treatment for MRSA infections.   Gram stain     Status: None   Collection Time: 09/15/15  3:54 PM  Result Value Ref Range Status   Specimen Description FLUID RIGHT PLEURAL  Final   Special Requests NONE  Final   Gram Stain   Final    CYTOSPIN SMEAR WBC PRESENT, PREDOMINANTLY MONONUCLEAR NO ORGANISMS SEEN Performed at [facility]    Report Status 09/15/2015 FINAL  Final     Studies: Dg Chest 1 View  09/15/2015   CLINICAL DATA:  Status post thoracentesis  EXAM: CHEST  1 VIEW  COMPARISON:  09/13/15  FINDINGS: There is moderate cardiac enlargement. Left chest wall port a catheter is noted with tip in the projection of the SVC. Cardiac enlargement is noted. There has been decrease in volume of  right pleural effusion. No significant pneumothorax identified. Similar appearance of diffuse pulmonary edema.  IMPRESSION: 1. No pneumothorax after right thoracentesis.   Electronically Signed   By: Kerby Moors M.D.   On: 09/15/2015 14:12   US Thoracentesis Asp Pleural Space W/img Guide  09/15/2015   CLINICAL DATA:  Recurrent right pleural effusion  EXAM: ULTRASOUND GUIDED RIGHT THORACENTESIS  COMPARISON:  None.  PROCEDURE: An ultrasound guided thoracentesis was thoroughly discussed with the patient and questions answered. The benefits, risks, alternatives and complications were also discussed. The patient understands and wishes to proceed with the procedure. Written consent was obtained.  Ultrasound was performed to localize and mark an adequate pocket of fluid in the right posterior chest. The area was then prepped and draped in the normal sterile fashion.  1% Lidocaine was used for local anesthesia. Under ultrasound guidance a 19 gauge Yueh catheter was introduced. Thoracentesis was performed. The catheter was removed and a dressing applied.  COMPLICATIONS: None.  FINDINGS: A total of approximately 2 L of amber fluid was removed. A fluid sample wassent for laboratory analysis.  IMPRESSION: Successful ultrasound guided right thoracentesis yielding 2 L of pleural fluid.   Electronically Signed   By: Rolm Baptise M.D.   On: 09/15/2015 14:48    Scheduled Meds: . aspirin EC  81 mg Oral Daily  . cefTAZidime (FORTAZ)  IV  2 g Intravenous 3 times per day  . digoxin  0.125 mg Oral Daily  . diltiazem  360 mg Oral Daily  . gabapentin  600 mg Oral BID  . ipratropium-albuterol  3 mL Nebulization TID  . loratadine  10 mg Oral Daily  . oxyCODONE-acetaminophen  1 tablet Oral Q6H   And  . oxyCODONE  5 mg Oral Q6H  . pantoprazole  40 mg Oral Daily  . predniSONE  40 mg Oral Q breakfast  . sodium chloride  3 mL Intravenous Q12H  . tamsulosin  0.4 mg Oral QHS  . vancomycin  1,000 mg Intravenous Once    Followed by  . vancomycin  750 mg Intravenous Q12H   Continuous Infusions:   Principal Problem:   COPD with acute exacerbation Active Problems:   Aortic stenosis, moderate   Acute diastolic CHF (congestive heart failure)   Atrial fibrillation with RVR   HCAP (healthcare-associated pneumonia)    Time spent: 25 minutes     Kathie Dike, MD  [facility] Pager [phone]. If 7PM-7AM, please contact night-coverage at [email or web address], password [facility] 09/16/2015, 9:33 AM  LOS: 3 days       By signing my name below, I, Rennis Harding, attest that this documentation has been prepared under the direction and in the presence of Kathie Dike, MD. Electronically signed: Rennis Harding, Scribe. 09/16/2015      I, Dr. Kathie Dike, personally performed the services described in this documentaiton. All medical record entries made by the scribe were at my direction and in my presence. I have reviewed the chart and agree that the record reflects my personal performance and is accurate and complete  Kathie Dike, MD, 09/16/2015 6:03 PM

## 2015-09-16 NOTE — Progress Notes (Signed)
ANTIBIOTIC CONSULT NOTE - INITIAL  Pharmacy Consult for vancomycin, renal adjust antibiotics Indication: pneumonia  Allergies  Allergen Reactions  . Cymbalta [Duloxetine Hcl] Other (See Comments)    Confusion   . Procaine Hcl Hives and Other (See Comments)    NOVOCAINE: Sweating, Confusion, Not in right state of mind.  [name]    unknown    Patient Measurements: Height: 5\' 8"  (172.7 cm) Weight: 208 lb 8.9 oz (94.6 kg) IBW/kg (Calculated) : 68.4   Vital Signs: Temp: 97.6 F (36.4 C) (09/21 0528) Temp Source: Oral (09/21 0528) BP: 121/55 mmHg (09/21 0528) Pulse Rate: 78 (09/21 0528) Intake/Output from previous day: 09/20 0701 - 09/21 0700 In: 480 [P.O.:480] Out: 300 [Urine:300] Intake/Output from this shift: Total I/O In: 600 [P.O.:600] Out: -   Labs:  Recent Labs  09/14/15 0531 09/15/15 0510 09/16/15 0601  WBC  --  5.6  --   HGB  --  11.1*  --   PLT  --  61*  --   CREATININE 1.14 1.02 1.04   Estimated Creatinine Clearance: 61.1 mL/min (by C-G formula based on Cr of 1.04).  Recent Labs  09/15/15 2101  [place] 24*     Microbiology: Recent Results (from the past 720 hour(s))  MRSA PCR Screening     Status: Abnormal   Collection Time: 08/23/15  9:11 AM  Result Value Ref Range Status   MRSA by PCR POSITIVE (A) NEGATIVE Final    Comment:        The GeneXpert MRSA Assay (FDA approved for NASAL specimens only), is one component of a comprehensive MRSA colonization surveillance program. It is not intended to diagnose MRSA infection nor to guide or monitor treatment for MRSA infections. RESULT CALLED TO, READ BACK BY AND VERIFIED WITH: BULLINS,M AT 1118 ON 08/23/15 BY MOSLEY,J   Culture, body fluid-bottle     Status: None   Collection Time: 08/24/15  3:05 PM  Result Value Ref Range Status   Specimen Description FLUID RIGHT PLEURAL  Final   Special Requests BOTTLES DRAWN AEROBIC AND ANAEROBIC 10CC EACH   Final   Culture NO GROWTH 5 DAYS  Final   Report Status 08/29/2015 FINAL  Final  Gram stain     Status: None   Collection Time: 08/24/15  3:05 PM  Result Value Ref Range Status   Specimen Description FLUID RIGHT PLEURAL  Final   Special Requests NONE  Final   Gram Stain   Final    CYTOSPIN SMEAR WBC PRESENT,BOTH PMN AND MONONUCLEAR NO ORGANISMS SEEN Performed at [facility]    Report Status 08/24/2015 FINAL  Final  Blood culture (routine x 2)     Status: None (Preliminary result)   Collection Time: 09/13/15  7:35 AM  Result Value Ref Range Status   Specimen Description BLOOD RIGHT ANTECUBITAL  Final   Special Requests BOTTLES DRAWN AEROBIC AND ANAEROBIC [place]  Final   Culture NO GROWTH 2 DAYS  Final   Report Status PENDING  Incomplete  Blood culture (routine x 2)     Status: None (Preliminary result)   Collection Time: 09/13/15  7:43 AM  Result Value Ref Range Status   Specimen Description BLOOD LEFT ANTECUBITAL  Final   Special Requests BOTTLES DRAWN AEROBIC AND ANAEROBIC 6CC EACH  Final   Culture NO GROWTH 2 DAYS  Final   Report Status PENDING  Incomplete  MRSA PCR Screening     Status: None   Collection  Time: 09/13/15 12:00 PM  Result Value Ref Range Status   MRSA by PCR NEGATIVE NEGATIVE Final    Comment:        The GeneXpert MRSA Assay (FDA approved for NASAL specimens only), is one component of a comprehensive MRSA colonization surveillance program. It is not intended to diagnose MRSA infection nor to guide or monitor treatment for MRSA infections.   Gram stain     Status: None   Collection Time: 09/15/15  3:54 PM  Result Value Ref Range Status   Specimen Description FLUID RIGHT PLEURAL  Final   Special Requests NONE  Final   Gram Stain   Final    CYTOSPIN SMEAR WBC PRESENT, PREDOMINANTLY MONONUCLEAR NO ORGANISMS SEEN Performed at [facility]    Report Status 09/15/2015 FINAL  Final    Medical History: Past Medical History  Diagnosis  Date  . COPD (chronic obstructive pulmonary disease)     Uses occasional nighttime O2  . Disseminated herpes zoster 2010  . GERD (gastroesophageal reflux disease)   . Asthma   . Pulmonary embolus     2003 and 2011  . Pulmonary nodules     Chest CT 09/11  . Mixed hyperlipidemia   . Chronic atrial fibrillation   . Lupus anticoagulant positive   . Essential hypertension, benign   . Cholelithiasis   . Rheumatoid arthritis(714.0)   . History of chicken pox 1941; 2011  . Anemia   . Chronic lower back pain   . Chronic diastolic heart failure   . Cirrhosis     Questionable, AFP normal Feb 01/2012, hx of ETOH use   . Headache(784.0)   . Cervical spondylosis without myelopathy   . Stage III chronic kidney disease   . Cellulitis of leg, left 01/03/2015  . Aortic stenosis, moderate 05/21/2015  . Dysrhythmia     chronic AFib    Medications:  Prescriptions prior to admission  Medication Sig Dispense Refill Last Dose  . cetirizine (ZYRTEC) 10 MG tablet Take 10 mg by mouth daily.     09/12/2015 at Unknown time  . digoxin (LANOXIN) 0.125 MG tablet Take 1 tablet (0.125 mg total) by mouth daily. .   09/12/2015 at Unknown time  . diltiazem (CARDIZEM CD) 360 MG 24 hr capsule Take 1 capsule (360 mg total) by mouth daily. 30 capsule 3 09/12/2015 at Unknown time  . ferrous sulfate 325 (65 FE) MG tablet Take 325 mg by mouth daily.   09/12/2015 at Unknown time  . folic acid (FOLVITE) 1 MG tablet Take 1 mg by mouth daily.    09/12/2015 at Unknown time  . furosemide (LASIX) 40 MG tablet Take 0.5 tablets (20 mg total) by mouth daily. Take daily for prevention of fluid buildup.   09/12/2015 at Unknown time  . gabapentin (NEURONTIN) 300 MG capsule Take 2 capsules (600 mg total) by mouth 2 (two) times daily. 30 capsule 0 09/12/2015 at Unknown time  . ketoconazole (NIZORAL) 2 % shampoo Apply 1 application topically 3 (three) times a week.    Past Week at Unknown time  . methotrexate (RHEUMATREX) 2.5 MG tablet Take 7.5  mg by mouth 2 (two) times a week. *TAKE 3 TABLETS BY MOUTH TWICE WEEKLY ON FRIDAYS AND SATURDAYS*   09/12/2015 at Unknown time  . mometasone (NASONEX) 50 MCG/ACT nasal spray Place 2 sprays into the nose daily.   09/12/2015 at Unknown time  . Omega-3 Fatty Acids (FISH OIL) 1000 MG CAPS Take 2 capsules by mouth 2 (  two) times daily.    09/12/2015 at Unknown time  . omeprazole (PRILOSEC) 40 MG capsule Take 40 mg by mouth daily.   09/12/2015 at Unknown time  . oxyCODONE-acetaminophen (PERCOCET) 10-325 MG per tablet Take 1 tablet by mouth every 6 (six) hours as needed for pain. 30 tablet 0 09/12/2015 at Unknown time  . potassium chloride SA (K-DUR,KLOR-CON) 20 MEQ tablet Take 0.5 tablets (10 mEq total) by mouth daily. Starting 08/27/15.   09/12/2015 at Unknown time  . tadalafil (CIALIS) 5 MG tablet Take 5 mg by mouth daily.   Past Week at Unknown time  . tamsulosin (FLOMAX) 0.4 MG CAPS capsule Take 1 capsule (0.4 mg total) by mouth at bedtime. For prostate treatment. 30 capsule 3 09/12/2015 at Unknown time  . albuterol (PROAIR HFA) 108 (90 BASE) MCG/ACT inhaler Inhale 2 puffs into the lungs every 6 (six) hours as needed. Shortness of Breath 1 Inhaler 3 unknown  . Etanercept (ENBREL) 25 MG/0.5ML SOSY Inject into the skin 2 (two) times a week.    09/11/2015  . polyethylene glycol-electrolytes (NULYTELY/GOLYTELY) 420 G solution Take 4,000 mLs by mouth once. 4000 mL 0 scheduled for 12/07/15  . Vitamin D, Ergocalciferol, (DRISDOL) 50000 UNITS CAPS capsule Take 1 capsule by mouth once a week.  4 09/11/2015   Assessment: 80 yo man to start broad spectrum antibiotics for HCAP, r/o sepsis.  Vanc 1 gm IV X 1 given.  Fortaz 2gm IV q8 hours ordered. Vancomycin trough 24 mcg/ml. Dose held 9/20 evening.  Day 4 antibiotics  Goal of Therapy:  Vancomycin trough level 15-20 mcg/ml  Plan:  Change Vancomycin 750mg  IV q12h  F/u renal function, cultures and clinical course Check vanc trough when appropriate.  Thanks for allowing  pharmacy to be a part of this patient's care. Isac Sarna, BS [place], BCPS Clinical Pharmacist Pager [phone]  09/16/2015,8:55 AM

## 2015-09-17 ENCOUNTER — Inpatient Hospital Stay (HOSPITAL_COMMUNITY): Payer: Medicare Other

## 2015-09-17 ENCOUNTER — Encounter (HOSPITAL_COMMUNITY): Payer: Self-pay

## 2015-09-17 DIAGNOSIS — M069 Rheumatoid arthritis, unspecified: Secondary | ICD-10-CM

## 2015-09-17 DIAGNOSIS — C8330 Diffuse large B-cell lymphoma, unspecified site: Secondary | ICD-10-CM

## 2015-09-17 DIAGNOSIS — J9611 Chronic respiratory failure with hypoxia: Secondary | ICD-10-CM

## 2015-09-17 DIAGNOSIS — J9610 Chronic respiratory failure, unspecified whether with hypoxia or hypercapnia: Secondary | ICD-10-CM | POA: Diagnosis present

## 2015-09-17 LAB — CBC
HEMATOCRIT: 35.5 % — AB (ref 39.0–52.0)
Hemoglobin: 11.8 g/dL — ABNORMAL LOW (ref 13.0–17.0)
MCH: 34.8 pg — ABNORMAL HIGH (ref 26.0–34.0)
MCHC: 33.2 g/dL (ref 30.0–36.0)
MCV: 104.7 fL — AB (ref 78.0–100.0)
PLATELETS: 78 10*3/uL — AB (ref 150–400)
RBC: 3.39 MIL/uL — AB (ref 4.22–5.81)
RDW: 16.8 % — ABNORMAL HIGH (ref 11.5–15.5)
WBC: 4.1 10*3/uL (ref 4.0–10.5)

## 2015-09-17 LAB — BASIC METABOLIC PANEL
ANION GAP: 5 (ref 5–15)
BUN: 22 mg/dL — ABNORMAL HIGH (ref 6–20)
CHLORIDE: 102 mmol/L (ref 101–111)
CO2: 29 mmol/L (ref 22–32)
CREATININE: 1.19 mg/dL (ref 0.61–1.24)
Calcium: 8.6 mg/dL — ABNORMAL LOW (ref 8.9–10.3)
GFR calc non Af Amer: 55 mL/min — ABNORMAL LOW (ref >60)
Glucose, Bld: 163 mg/dL — ABNORMAL HIGH (ref 65–99)
POTASSIUM: 4.3 mmol/L (ref 3.5–5.1)
SODIUM: 136 mmol/L (ref 135–145)

## 2015-09-17 LAB — HEPATITIS B CORE ANTIBODY, IGM: Hep B C IgM: NEGATIVE

## 2015-09-17 LAB — HEPATITIS B SURFACE ANTIGEN: Hepatitis B Surface Ag: NEGATIVE

## 2015-09-17 MED ORDER — HEPARIN SOD (PORK) LOCK FLUSH 100 UNIT/ML IV SOLN
INTRAVENOUS | Status: AC
  Filled 2015-09-17: qty 5

## 2015-09-17 MED ORDER — TECHNETIUM TC 99M-LABELED RED BLOOD CELLS IV KIT
25.0000 | PACK | Freq: Once | INTRAVENOUS | Status: AC | PRN
  Administered 2015-09-17: 25 via INTRAVENOUS

## 2015-09-17 MED ORDER — LEVOFLOXACIN 750 MG PO TABS
750.0000 mg | ORAL_TABLET | Freq: Every day | ORAL | Status: DC
  Administered 2015-09-17 – 2015-09-18 (×2): 750 mg via ORAL
  Filled 2015-09-17 (×2): qty 1

## 2015-09-17 MED ORDER — LIDOCAINE-PRILOCAINE 2.5-2.5 % EX CREA: TOPICAL_CREAM | CUTANEOUS | Status: DC

## 2015-09-17 NOTE — Patient Instructions (Addendum)
[facility]   CHEMOTHERAPY INSTRUCTIONS  Premeds Day 1: Aloxi - for nausea/vomiting prevention/reduction, Emend -  for nausea/vomiting prevention/reduction, Dexamethasone - steroid - given to reduce the risk of you having an allergic type reaction to the chemotherapy. Dex can cause you to feel energized, nervous/anxious/jittery, make you have trouble sleeping, and/or make you feel hot/flushed in the face/neck and/or look pink/red in the face/neck. These side effects will pass as the Dex wears off. Elitek - protects kidneys from the by product of tumor cell destruction.   Day 1:  Adriamycin - bone marrow suppression, nausea, vomiting, hair loss, mouth sores, cardiotoxicity (this is why we do the 2D Echoes), sensitivity to light, will turn urine red for a few voids after receiving it. After voiding red a few times, your urine should begin to go back to a normal yellow color.  Cytoxan - can cause hemorrhagic cystitis (bloody urine) - this chemo irritates your bladder! We need you drinking 64 oz of fluid (preferably water/decaff fluids) 2 days prior to chemo and for up to 4-5 days after chemo. Drink more if you can. Do not hold your urine. Urinate before you go to bed and if you wake up in the middle of the night. This can also cause nausea/vomiting and hair loss.  Vincristine - peripheral neuropathy - numbness/tingling/burning in hands/fingers/feet/toes. Let us know if this develops. Hair loss, constipation, jaw pain.  Premeds Day 2: Tylenol - reduce the risk of having fever/chills from Rituxan. Benadryl - antihistamine - reduce the risk of you having allergic type reaction to the Rituxa(itching, rash) Elitek - protects kidneys from the by product of tumor cell destruction.   Day 2:  Rituxan - Before we administer Rituxan we will give you Tylenol 650mg  and Benadryl 50mg . This reduces your risk of having an allergic reaction to the Rituxan. You will receive these  medications each time prior to Rituxan. Side Effects of Rituxan: during infusion - itching, low blood pressure, low oxygen, bronchospasm, rash, trouble breathing - we need to know immediately if any of this happens. The first time you receive this drug, it takes a long time to infuse because we titrate the drug very slowly. With each Rituxan infusion, the likelihood of developing an infusion reaction decreases. You may also experience fever, chills, shaking chills, headaches, muscle aches, nausea, rash, and a low white blood cell count. We need to be sure that you are drinking plenty of fluids - preferably 64oz of decaff fluids/water daily. It is best to start drinking fluids 2 days prior to treatment and for up to 4-5 days after treatment. As your tumor breaks down, it leaves behind uric acid and the extra fluid that you drink helps to flush this out of your body. You will also be on a medication called Allopurinol while taking Rituxan which help rid your body of the uric acid. It is important that you take this medication daily as prescribed.    For 5 days in a row you will take Prednisone 90mg . This will occur on Days 1-5 of chemo. Please refer to your calendar. The Prednisone is part of your chemotherapy regimen. Prednisone in high doses along with chemo helps to eat away at the tumor cells.   Neulasta - this medication is not chemo but being given because you have had chemo. It is usually given 24 hours after the completion of chemotherapy. This medication works by boosting your bone marrow's supply of white blood cells. White  blood cells are what protect our bodies against infection. The medication is given in the form of a subcutaneous injection. It is given in the fatty tissue of your abdomen. It is a short needle. The major side effect of this medication is bone or muscle pain. The drug of choice to relieve or lessen the pain is Aleve or Ibuprofen. If a physician has ever told you not to take Aleve or  Ibuprofen - then don't take it. You should then take Tylenol/acetaminophen. Take either medication as the bottle directs you to.  The level of pain you experience as a result of this injection can range from none, to mild or moderate, or severe. Please let us know if you develop moderate or severe bone pain.     POTENTIAL SIDE EFFECTS OF TREATMENT: Increased Susceptibility to Infection, Vomiting, Constipation, Hair Thinning, Changes in Character of Skin and Nails (brittleness, dryness,etc.), Bone Marrow Suppression, Abdominal Cramping, Complete Hair Loss, Nausea, Diarrhea, Sun Sensitivity and Mouth Sores   EDUCATIONAL MATERIALS GIVEN AND REVIEWED: Chemotherapy and You Book Specific Instructions Sheets   SELF CARE ACTIVITIES WHILE ON CHEMOTHERAPY: Increase your fluid intake 48 hours prior to treatment and drink at least 2 quarts per day after treatment., No alcohol intake., No aspirin or other medications unless approved by your oncologist., Eat foods that are light and easy to digest., Eat foods at cold or room temperature., No fried, fatty, or spicy foods immediately before or after treatment., Have teeth cleaned professionally before starting treatment. Keep dentures and partial plates clean., Use soft toothbrush and do not use mouthwashes that contain alcohol. Biotene is a good mouthwash that is available at most pharmacies or may be ordered by calling [phone]., Use warm salt water gargles (1 teaspoon salt per 1 quart warm water) before and after meals and at bedtime. Or you may rinse with 2 tablespoons of three -percent hydrogen peroxide mixed in eight ounces of water., Always use sunscreen with SPF (Sun Protection Factor) of 30 or higher., Use your nausea medication as directed to prevent nausea., Use your stool softener or laxative as directed to prevent constipation. and Use your anti-diarrheal medication as directed to stop diarrhea.  Please wash your hands for at least 30 seconds using  warm soapy water. Handwashing is the #1 way to prevent the spread of germs. Stay away from sick people or people who are getting over a cold. If you develop respiratory systems such as green/yellow mucus production or productive cough or persistent cough let us know and we will see if you need an antibiotic. It is a good idea to keep a pair of gloves on when going into grocery stores/[employer] to decrease your risk of coming into contact with germs on the carts, etc. Carry alcohol hand gel with you at all times and use it frequently if out in public. All foods need to be cooked thoroughly. No raw foods. No medium or undercooked meats, eggs. If your food is cooked medium well, it does not need to be hot pink or saturated with bloody liquid at all. Vegetables and fruits need to be washed/rinsed under the faucet with a dish detergent before being consumed. You can eat raw fruits and vegetables unless we tell you otherwise but it would be best if you cooked them or bought frozen. Do not eat off of salad bars or hot bars unless you really trust the cleanliness of the restaurant. If you need dental work, please let Dr. Whitney Muse know before you go  for your appointment so that we can coordinate the best possible time for you in regards to your chemo regimen. You need to also let your dentist know that you are actively taking chemo. We may need to do labs prior to your dental appointment. We also want your bowels moving at least every other day. If this is not happening, we need to know so that we can get you on a bowel regimen to help you go.       MEDICATIONS: You have been given prescriptions for the following medications:  Prednisone 20mg  tablet. Take 4.5 tablets (90mg  total) on days 1-5 of chemo).  Allopurinol 300mg  tablet. Take 1 tablet daily.   Zofran 8mg  tablet. Take 1 tablet every 8 hours as needed for nausea/vomiting. (#1 nausea med to take, this can constipate)  Compazine 10mg  tablet. Take 1 tablet  every 6 hours as needed for nausea/vomiting. (#2 nausea med to take, this can make you sleepy)  EMLA cream. Apply a quarter size amount to port site 1 hour prior to chemo. Do not rub in. Cover with plastic wrap.   Over-the-Counter Meds:  Miralax 1 capful in 8 oz of fluid daily. May increase to two times a day if needed. This is a stool softener. If this doesn't work proceed you can add:  Senokot S  - start with 1 tablet two times a day and increase to 4 tablets two times a day if needed. (total of 8 tablets in a 24 hour period). This is a stimulant laxative.   Call us if this does not help your bowels move.   Imodium 2mg  capsule. Take 2 capsules after the 1st loose stool and then 1 capsule every 2 hours until you go a total of 12 hours without having a loose stool. Call the [facility] if loose stools continue.   SYMPTOMS TO REPORT AS SOON AS POSSIBLE AFTER TREATMENT:  FEVER GREATER THAN 100.5 F  CHILLS WITH OR WITHOUT FEVER  NAUSEA AND VOMITING THAT IS NOT CONTROLLED WITH YOUR NAUSEA MEDICATION  UNUSUAL SHORTNESS OF BREATH  UNUSUAL BRUISING OR BLEEDING  TENDERNESS IN MOUTH AND THROAT WITH OR WITHOUT PRESENCE OF ULCERS  URINARY PROBLEMS  BOWEL PROBLEMS  UNUSUAL RASH    Wear comfortable clothing and clothing appropriate for easy access to any Portacath or PICC line. Let us know if there is anything that we can do to make your therapy better!      I have been informed and understand all of the instructions given to me and have received a copy. I have been instructed to call the clinic [phone] or my family physician as soon as possible for continued medical care, if indicated. I do not have any more questions at this time but understand that I may call the [facility] or the Patient Navigator at [phone] during office hours should I have questions or need assistance in obtaining follow-up care.            Doxorubicin injection What is this  medicine? DOXORUBICIN (dox oh ROO bi sin) is a chemotherapy drug. It is used to treat many kinds of cancer like Hodgkin's disease, leukemia, non-Hodgkin's lymphoma, neuroblastoma, sarcoma, and Wilms' tumor. It is also used to treat bladder cancer, breast cancer, lung cancer, ovarian cancer, stomach cancer, and thyroid cancer. This medicine may be used for other purposes; ask your health care provider or pharmacist if you have questions. COMMON BRAND NAME(S): Adriamycin, Adriamycin PFS, Adriamycin RDF, Rubex What should  I tell my health care provider before I take this medicine? They need to know if you have any of these conditions: -blood disorders -heart disease, recent heart attack -infection (especially a virus infection such as chickenpox, cold sores, or herpes) -irregular heartbeat -liver disease -recent or ongoing radiation therapy -an unusual or allergic reaction to doxorubicin, other chemotherapy agents, other medicines, foods, dyes, or preservatives -pregnant or trying to get pregnant -breast-feeding How should I use this medicine? This drug is given as an infusion into a vein. It is administered in a hospital or clinic by a specially trained health care professional. If you have pain, swelling, burning or any unusual feeling around the site of your injection, tell your health care professional right away. Talk to your pediatrician regarding the use of this medicine in children. Special care may be needed. Overdosage: If you think you have taken too much of this medicine contact a poison control center or emergency room at once. NOTE: This medicine is only for you. Do not share this medicine with others. What if I miss a dose? It is important not to miss your dose. Call your doctor or health care professional if you are unable to keep an appointment. What may interact with this medicine? Do not take this medicine with any of the following  medications: -cisapride -droperidol -halofantrine -pimozide -zidovudine This medicine may also interact with the following medications: -chloroquine -chlorpromazine -clarithromycin -cyclophosphamide -cyclosporine -erythromycin -medicines for depression, anxiety, or psychotic disturbances -medicines for irregular heart beat like amiodarone, bepridil, dofetilide, encainide, flecainide, propafenone, quinidine -medicines for seizures like ethotoin, fosphenytoin, phenytoin -medicines for nausea, vomiting like dolasetron, ondansetron, palonosetron -medicines to increase blood counts like filgrastim, pegfilgrastim, sargramostim -methadone -methotrexate -pentamidine -progesterone -vaccines -verapamil Talk to your doctor or health care professional before taking any of these medicines: -acetaminophen -aspirin -ibuprofen -ketoprofen -naproxen This list may not describe all possible interactions. Give your health care provider a list of all the medicines, herbs, non-prescription drugs, or dietary supplements you use. Also tell them if you smoke, drink alcohol, or use illegal drugs. Some items may interact with your medicine. What should I watch for while using this medicine? Your condition will be monitored carefully while you are receiving this medicine. You will need important blood work done while you are taking this medicine. This drug may make you feel generally unwell. This is not uncommon, as chemotherapy can affect healthy cells as well as cancer cells. Report any side effects. Continue your course of treatment even though you feel ill unless your doctor tells you to stop. Your urine may turn red for a few days after your dose. This is not blood. If your urine is dark or brown, call your doctor. In some cases, you may be given additional medicines to help with side effects. Follow all directions for their use. Call your doctor or health care professional for advice if you get a  fever, chills or sore throat, or other symptoms of a cold or flu. Do not treat yourself. This drug decreases your body's ability to fight infections. Try to avoid being around people who are sick. This medicine may increase your risk to bruise or bleed. Call your doctor or health care professional if you notice any unusual bleeding. Be careful brushing and flossing your teeth or using a toothpick because you may get an infection or bleed more easily. If you have any dental work done, tell your dentist you are receiving this medicine. Avoid taking products that contain aspirin,  acetaminophen, ibuprofen, naproxen, or ketoprofen unless instructed by your doctor. These medicines may hide a fever. Men and women of childbearing age should use effective birth control methods while using taking this medicine. Do not become pregnant while taking this medicine. There is a potential for serious side effects to an unborn child. Talk to your health care professional or pharmacist for more information. Do not breast-feed an infant while taking this medicine. Do not let others touch your urine or other body fluids for 5 days after each treatment with this medicine. Caregivers should wear latex gloves to avoid touching body fluids during this time. There is a maximum amount of this medicine you should receive throughout your life. The amount depends on the medical condition being treated and your overall health. Your doctor will watch how much of this medicine you receive in your lifetime. Tell your doctor if you have taken this medicine before. What side effects may I notice from receiving this medicine? Side effects that you should report to your doctor or health care professional as soon as possible: -allergic reactions like skin rash, itching or hives, swelling of the face, lips, or tongue -low blood counts - this medicine may decrease the number of white blood cells, red blood cells and platelets. You may be at  increased risk for infections and bleeding. -signs of infection - fever or chills, cough, sore throat, pain or difficulty passing urine -signs of decreased platelets or bleeding - bruising, pinpoint red spots on the skin, black, tarry stools, blood in the urine -signs of decreased red blood cells - unusually weak or tired, fainting spells, lightheadedness -breathing problems -chest pain -fast, irregular heartbeat -mouth sores -nausea, vomiting -pain, swelling, redness at site where injected -pain, tingling, numbness in the hands or feet -swelling of ankles, feet, or hands -unusual bleeding or bruising Side effects that usually do not require medical attention (report to your doctor or health care professional if they continue or are bothersome): -diarrhea -facial flushing -hair loss -loss of appetite -missed menstrual periods -nail discoloration or damage -red or watery eyes -red colored urine -stomach upset This list may not describe all possible side effects. Call your doctor for medical advice about side effects. You may report side effects to FDA at 1-800-FDA-1088. Where should I keep my medicine? This drug is given in a hospital or clinic and will not be stored at home. NOTE: This sheet is a summary. It may not cover all possible information. If you have questions about this medicine, talk to your doctor, pharmacist, or health care provider.  2015, [employer]/Gold Standard. (2013-04-09 09:54:34) Cyclophosphamide injection What is this medicine? CYCLOPHOSPHAMIDE (sye kloe FOSS fa mide) is a chemotherapy drug. It slows the growth of cancer cells. This medicine is used to treat many types of cancer like lymphoma, myeloma, leukemia, breast cancer, and ovarian cancer, to name a few. This medicine may be used for other purposes; ask your health care provider or pharmacist if you have questions. COMMON BRAND NAME(S): Cytoxan, Neosar What should I tell my health care provider before I  take this medicine? They need to know if you have any of these conditions: -blood disorders -history of other chemotherapy -infection -kidney disease -liver disease -recent or ongoing radiation therapy -tumors in the bone marrow -an unusual or allergic reaction to cyclophosphamide, other chemotherapy, other medicines, foods, dyes, or preservatives -pregnant or trying to get pregnant -breast-feeding How should I use this medicine? This drug is usually given as an injection into a  vein or muscle or by infusion into a vein. It is administered in a hospital or clinic by a specially trained health care professional. Talk to your pediatrician regarding the use of this medicine in children. Special care may be needed. Overdosage: If you think you have taken too much of this medicine contact a poison control center or emergency room at once. NOTE: This medicine is only for you. Do not share this medicine with others. What if I miss a dose? It is important not to miss your dose. Call your doctor or health care professional if you are unable to keep an appointment. What may interact with this medicine? This medicine may interact with the following medications: -amiodarone -amphotericin B -azathioprine -certain antiviral medicines for HIV or AIDS such as protease inhibitors (e.g., indinavir, ritonavir) and zidovudine -certain blood pressure medications such as benazepril, captopril, enalapril, fosinopril, lisinopril, moexipril, monopril, perindopril, quinapril, ramipril, trandolapril -certain cancer medications such as anthracyclines (e.g., daunorubicin, doxorubicin), busulfan, cytarabine, paclitaxel, pentostatin, tamoxifen, trastuzumab -certain diuretics such as chlorothiazide, chlorthalidone, hydrochlorothiazide, indapamide, metolazone -certain medicines that treat or prevent blood clots like warfarin -certain muscle relaxants such as  succinylcholine -cyclosporine -etanercept -indomethacin -medicines to increase blood counts like filgrastim, pegfilgrastim, sargramostim -medicines used as general anesthesia -metronidazole -natalizumab This list may not describe all possible interactions. Give your health care provider a list of all the medicines, herbs, non-prescription drugs, or dietary supplements you use. Also tell them if you smoke, drink alcohol, or use illegal drugs. Some items may interact with your medicine. What should I watch for while using this medicine? Visit your doctor for checks on your progress. This drug may make you feel generally unwell. This is not uncommon, as chemotherapy can affect healthy cells as well as cancer cells. Report any side effects. Continue your course of treatment even though you feel ill unless your doctor tells you to stop. Drink water or other fluids as directed. Urinate often, even at night. In some cases, you may be given additional medicines to help with side effects. Follow all directions for their use. Call your doctor or health care professional for advice if you get a fever, chills or sore throat, or other symptoms of a cold or flu. Do not treat yourself. This drug decreases your body's ability to fight infections. Try to avoid being around people who are sick. This medicine may increase your risk to bruise or bleed. Call your doctor or health care professional if you notice any unusual bleeding. Be careful brushing and flossing your teeth or using a toothpick because you may get an infection or bleed more easily. If you have any dental work done, tell your dentist you are receiving this medicine. You may get drowsy or dizzy. Do not drive, use machinery, or do anything that needs mental alertness until you know how this medicine affects you. Do not become pregnant while taking this medicine or for 1 year after stopping it. Women should inform their doctor if they wish to become  pregnant or think they might be pregnant. Men should not father a child while taking this medicine and for 4 months after stopping it. There is a potential for serious side effects to an unborn child. Talk to your health care professional or pharmacist for more information. Do not breast-feed an infant while taking this medicine. This medicine may interfere with the ability to have a child. This medicine has caused ovarian failure in some women. This medicine has caused reduced sperm counts in some  men. You should talk with your doctor or health care professional if you are concerned about your fertility. If you are going to have surgery, tell your doctor or health care professional that you have taken this medicine. What side effects may I notice from receiving this medicine? Side effects that you should report to your doctor or health care professional as soon as possible: -allergic reactions like skin rash, itching or hives, swelling of the face, lips, or tongue -low blood counts - this medicine may decrease the number of white blood cells, red blood cells and platelets. You may be at increased risk for infections and bleeding. -signs of infection - fever or chills, cough, sore throat, pain or difficulty passing urine -signs of decreased platelets or bleeding - bruising, pinpoint red spots on the skin, black, tarry stools, blood in the urine -signs of decreased red blood cells - unusually weak or tired, fainting spells, lightheadedness -breathing problems -dark urine -dizziness -palpitations -swelling of the ankles, feet, hands -trouble passing urine or change in the amount of urine -weight gain -yellowing of the eyes or skin Side effects that usually do not require medical attention (report to your doctor or health care professional if they continue or are bothersome): -changes in nail or skin color -hair loss -missed menstrual periods -mouth sores -nausea, vomiting This list may not  describe all possible side effects. Call your doctor for medical advice about side effects. You may report side effects to FDA at 1-800-FDA-1088. Where should I keep my medicine? This drug is given in a hospital or clinic and will not be stored at home. NOTE: This sheet is a summary. It may not cover all possible information. If you have questions about this medicine, talk to your doctor, pharmacist, or health care provider.  2015, [employer] Standard. (2012-10-26 16:22:58) Vincristine injection What is this medicine? VINCRISTINE (vin KRIS teen) is a chemotherapy drug. It slows the growth of cancer cells. This medicine is used to treat many types of cancer like Hodgkin's disease, leukemia, non-Hodgkin's lymphoma, neuroblastoma (brain cancer), rhabdomyosarcoma, and Wilms' tumor. This medicine may be used for other purposes; ask your health care provider or pharmacist if you have questions. COMMON BRAND NAME(S): Oncovin, Vincasar PFS What should I tell my health care provider before I take this medicine? They need to know if you have any of these conditions: -blood disorders -gout -infection (especially chickenpox, cold sores, or herpes) -kidney disease -liver disease -lung disease -nervous system disease like Charcot-Marie-Tooth (CMT) -recent or ongoing radiation therapy -an unusual or allergic reaction to vincristine, other chemotherapy agents, other medicines, foods, dyes, or preservatives -pregnant or trying to get pregnant -breast-feeding How should I use this medicine? This drug is given as an infusion into a vein. It is administered in a hospital or clinic by a specially trained health care professional. If you have pain, swelling, burning, or any unusual feeling around the site of your injection, tell your health care professional right away. Talk to your pediatrician regarding the use of this medicine in children. While this drug may be prescribed for selected conditions,  precautions do apply. Overdosage: If you think you have taken too much of this medicine contact a poison control center or emergency room at once. NOTE: This medicine is only for you. Do not share this medicine with others. What if I miss a dose? It is important not to miss your dose. Call your doctor or health care professional if you are unable to keep an appointment. What  may interact with this medicine? Do not take this medicine with any of the following medications: -itraconazole -mibefradil -voriconazole This medicine may also interact with the following medications: -cyclosporine -erythromycin -fluconazole -ketoconazole -medicines for HIV like delavirdine, efavirenz, nevirapine -medicines for seizures like ethotoin, fosphenotoin, phenytoin -medicines to increase blood counts like filgrastim, pegfilgrastim, sargramostim -other chemotherapy drugs like cisplatin, L-asparaginase, methotrexate, mitomycin, paclitaxel -pegaspargase -vaccines -zalcitabine, ddC Talk to your doctor or health care professional before taking any of these medicines: -acetaminophen -aspirin -ibuprofen -ketoprofen -naproxen This list may not describe all possible interactions. Give your health care provider a list of all the medicines, herbs, non-prescription drugs, or dietary supplements you use. Also tell them if you smoke, drink alcohol, or use illegal drugs. Some items may interact with your medicine. What should I watch for while using this medicine? Your condition will be monitored carefully while you are receiving this medicine. You will need important blood work done while you are taking this medicine. This drug may make you feel generally unwell. This is not uncommon, as chemotherapy can affect healthy cells as well as cancer cells. Report any side effects. Continue your course of treatment even though you feel ill unless your doctor tells you to stop. In some cases, you may be given additional  medicines to help with side effects. Follow all directions for their use. Call your doctor or health care professional for advice if you get a fever, chills or sore throat, or other symptoms of a cold or flu. Do not treat yourself. Avoid taking products that contain aspirin, acetaminophen, ibuprofen, naproxen, or ketoprofen unless instructed by your doctor. These medicines may hide a fever. Do not become pregnant while taking this medicine. Women should inform their doctor if they wish to become pregnant or think they might be pregnant. There is a potential for serious side effects to an unborn child. Talk to your health care professional or pharmacist for more information. Do not breast-feed an infant while taking this medicine. Men may have a lower sperm count while taking this medicine. Talk to your doctor if you plan to father a child. What side effects may I notice from receiving this medicine? Side effects that you should report to your doctor or health care professional as soon as possible: -allergic reactions like skin rash, itching or hives, swelling of the face, lips, or tongue -breathing problems -confusion or changes in emotions or moods -constipation -cough -mouth sores -muscle weakness -nausea and vomiting -pain, swelling, redness or irritation at the injection site -pain, tingling, numbness in the hands or feet -problems with balance, talking, walking -seizures -stomach pain -trouble passing urine or change in the amount of urine Side effects that usually do not require medical attention (report to your doctor or health care professional if they continue or are bothersome): -diarrhea -hair loss -jaw pain -loss of appetite This list may not describe all possible side effects. Call your doctor for medical advice about side effects. You may report side effects to FDA at 1-800-FDA-1088. Where should I keep my medicine? This drug is given in a hospital or clinic and will not be  stored at home. NOTE: This sheet is a summary. It may not cover all possible information. If you have questions about this medicine, talk to your doctor, pharmacist, or health care provider.  2015, [employer]. (2008-09-08 17:17:13) Rituximab injection What is this medicine? RITUXIMAB (ri TUX i mab) is a monoclonal antibody. This medicine changes the way the body's immune system works. It  is used commonly to treat non-Hodgkin's lymphoma and other conditions. In cancer cells, this drug targets a specific protein within cancer cells and stops the cancer cells from growing. It is also used to treat rhuematoid arthritis (RA). In RA, this medicine slow the inflammatory process and help reduce joint pain and swelling. This medicine is often used with other cancer or arthritis medications. This medicine may be used for other purposes; ask your health care provider or pharmacist if you have questions. COMMON BRAND NAME(S): Rituxan What should I tell my health care provider before I take this medicine? They need to know if you have any of these conditions: -blood disorders -heart disease -history of hepatitis B -infection (especially a virus infection such as chickenpox, cold sores, or herpes) -irregular heartbeat -kidney disease -lung or breathing disease, like asthma -lupus -an unusual or allergic reaction to rituximab, mouse proteins, other medicines, foods, dyes, or preservatives -pregnant or trying to get pregnant -breast-feeding How should I use this medicine? This medicine is for infusion into a vein. It is administered in a hospital or clinic by a specially trained health care professional. A special MedGuide will be given to you by the pharmacist with each prescription and refill. Be sure to read this information carefully each time. Talk to your pediatrician regarding the use of this medicine in children. This medicine is not approved for use in children. Overdosage: If you  think you have taken too much of this medicine contact a poison control center or emergency room at once. NOTE: This medicine is only for you. Do not share this medicine with others. What if I miss a dose? It is important not to miss a dose. Call your doctor or health care professional if you are unable to keep an appointment. What may interact with this medicine? -cisplatin -medicines for blood pressure -some other medicines for arthritis -vaccines This list may not describe all possible interactions. Give your health care provider a list of all the medicines, herbs, non-prescription drugs, or dietary supplements you use. Also tell them if you smoke, drink alcohol, or use illegal drugs. Some items may interact with your medicine. What should I watch for while using this medicine? Report any side effects that you notice during your treatment right away, such as changes in your breathing, fever, chills, dizziness or lightheadedness. These effects are more common with the first dose. Visit your prescriber or health care professional for checks on your progress. You will need to have regular blood work. Report any other side effects. The side effects of this medicine can continue after you finish your treatment. Continue your course of treatment even though you feel ill unless your doctor tells you to stop. Call your doctor or health care professional for advice if you get a fever, chills or sore throat, or other symptoms of a cold or flu. Do not treat yourself. This drug decreases your body's ability to fight infections. Try to avoid being around people who are sick. This medicine may increase your risk to bruise or bleed. Call your doctor or health care professional if you notice any unusual bleeding. Be careful brushing and flossing your teeth or using a toothpick because you may get an infection or bleed more easily. If you have any dental work done, tell your dentist you are receiving this  medicine. Avoid taking products that contain aspirin, acetaminophen, ibuprofen, naproxen, or ketoprofen unless instructed by your doctor. These medicines may hide a fever. Do not become pregnant while taking  this medicine. Women should inform their doctor if they wish to become pregnant or think they might be pregnant. There is a potential for serious side effects to an unborn child. Talk to your health care professional or pharmacist for more information. Do not breast-feed an infant while taking this medicine. What side effects may I notice from receiving this medicine? Side effects that you should report to your doctor or health care professional as soon as possible: -allergic reactions like skin rash, itching or hives, swelling of the face, lips, or tongue -low blood counts - this medicine may decrease the number of white blood cells, red blood cells and platelets. You may be at increased risk for infections and bleeding. -signs of infection - fever or chills, cough, sore throat, pain or difficulty passing urine -signs of decreased platelets or bleeding - bruising, pinpoint red spots on the skin, black, tarry stools, blood in the urine -signs of decreased red blood cells - unusually weak or tired, fainting spells, lightheadedness -breathing problems -confused, not responsive -chest pain -fast, irregular heartbeat -feeling faint or lightheaded, falls -mouth sores -redness, blistering, peeling or loosening of the skin, including inside the mouth -stomach pain -swelling of the ankles, feet, or hands -trouble passing urine or change in the amount of urine Side effects that usually do not require medical attention (report to your doctor or other health care professional if they continue or are bothersome): -anxiety -headache -loss of appetite -muscle aches -nausea -night sweats This list may not describe all possible side effects. Call your doctor for medical advice about side effects. You  may report side effects to FDA at 1-800-FDA-1088. Where should I keep my medicine? This drug is given in a hospital or clinic and will not be stored at home. NOTE: This sheet is a summary. It may not cover all possible information. If you have questions about this medicine, talk to your doctor, pharmacist, or health care provider.  2015, [employer] Standard. (2008-08-11 14:04:59) Prednisone tablets What is this medicine? PREDNISONE (PRED ni sone) is a corticosteroid. It is commonly used to treat inflammation of the skin, joints, lungs, and other organs. Common conditions treated include asthma, allergies, and arthritis. It is also used for other conditions, such as blood disorders and diseases of the adrenal glands. This medicine may be used for other purposes; ask your health care provider or pharmacist if you have questions. COMMON BRAND NAME(S): Deltasone, Predone, Sterapred, Sterapred DS What should I tell my health care provider before I take this medicine? They need to know if you have any of these conditions: -Cushing's syndrome -diabetes -glaucoma -heart disease -high blood pressure -infection (especially a virus infection such as chickenpox, cold sores, or herpes) -kidney disease -liver disease -mental illness -myasthenia gravis -osteoporosis -seizures -stomach or intestine problems -thyroid disease -an unusual or allergic reaction to lactose, prednisone, other medicines, foods, dyes, or preservatives -pregnant or trying to get pregnant -breast-feeding How should I use this medicine? Take this medicine by mouth with a glass of water. Follow the directions on the prescription label. Take this medicine with food. If you are taking this medicine once a day, take it in the morning. Do not take more medicine than you are told to take. Do not suddenly stop taking your medicine because you may develop a severe reaction. Your doctor will tell you how much medicine to take. If your  doctor wants you to stop the medicine, the dose may be slowly lowered over time to avoid any side  effects. Talk to your pediatrician regarding the use of this medicine in children. Special care may be needed. Overdosage: If you think you have taken too much of this medicine contact a poison control center or emergency room at once. NOTE: This medicine is only for you. Do not share this medicine with others. What if I miss a dose? If you miss a dose, take it as soon as you can. If it is almost time for your next dose, talk to your doctor or health care professional. You may need to miss a dose or take an extra dose. Do not take double or extra doses without advice. What may interact with this medicine? Do not take this medicine with any of the following medications: -metyrapone -mifepristone This medicine may also interact with the following medications: -aminoglutethimide -amphotericin B -aspirin and aspirin-like medicines -barbiturates -certain medicines for diabetes, like glipizide or glyburide -cholestyramine -cholinesterase inhibitors -cyclosporine -digoxin -diuretics -ephedrine -male hormones, like estrogens and birth control pills -isoniazid -ketoconazole -NSAIDS, medicines for pain and inflammation, like ibuprofen or naproxen -phenytoin -rifampin -toxoids -vaccines -warfarin This list may not describe all possible interactions. Give your health care provider a list of all the medicines, herbs, non-prescription drugs, or dietary supplements you use. Also tell them if you smoke, drink alcohol, or use illegal drugs. Some items may interact with your medicine. What should I watch for while using this medicine? Visit your doctor or health care professional for regular checks on your progress. If you are taking this medicine over a prolonged period, carry an identification card with your name and address, the type and dose of your medicine, and your doctor's name and  address. This medicine may increase your risk of getting an infection. Tell your doctor or health care professional if you are around anyone with measles or chickenpox, or if you develop sores or blisters that do not heal properly. If you are going to have surgery, tell your doctor or health care professional that you have taken this medicine within the last twelve months. Ask your doctor or health care professional about your diet. You may need to lower the amount of salt you eat. This medicine may affect blood sugar levels. If you have diabetes, check with your doctor or health care professional before you change your diet or the dose of your diabetic medicine. What side effects may I notice from receiving this medicine? Side effects that you should report to your doctor or health care professional as soon as possible: -allergic reactions like skin rash, itching or hives, swelling of the face, lips, or tongue -changes in emotions or moods -changes in vision -depressed mood -eye pain -fever or chills, cough, sore throat, pain or difficulty passing urine -increased thirst -swelling of ankles, feet Side effects that usually do not require medical attention (report to your doctor or health care professional if they continue or are bothersome): -confusion, excitement, restlessness -headache -nausea, vomiting -skin problems, acne, thin and shiny skin -trouble sleeping -weight gain This list may not describe all possible side effects. Call your doctor for medical advice about side effects. You may report side effects to FDA at 1-800-FDA-1088. Where should I keep my medicine? Keep out of the reach of children. Store at room temperature between 15 and 30 degrees C (59 and 86 degrees F). Protect from light. Keep container tightly closed. Throw away any unused medicine after the expiration date. NOTE: This sheet is a summary. It may not cover all possible information. If you have  questions about  this medicine, talk to your doctor, pharmacist, or health care provider.  2015, [employer] Standard. (2011-07-28 10:57:14) Rasburicase Injection What is this medicine? RASBURICASE (ras BURE i kase) breaks down uric acid in the blood. It is used to prevent and to treat high levels of uric acid caused by cancer treatment. This medicine may be used for other purposes; ask your health care provider or pharmacist if you have questions. COMMON BRAND NAME(S): Elitek What should I tell my health care provider before I take this medicine? They need to know if you have any of these conditions: -G6PD deficiency -history of anemia -history of blood transfusion -an unusual or allergic reaction to rasburicase, yeast, mannitol, other medicines, foods, dyes, or preservatives -pregnant or trying to get pregnant -breast-feeding How should I use this medicine? This medicine is for infusion into a vein. It is given by a health care professional in a hospital or clinic setting. Talk to your pediatrician regarding the use of this medicine in children. While this drug may be prescribed for children as young as 32 month old for selected conditions, precautions do apply. Overdosage: If you think you have taken too much of this medicine contact a poison control center or emergency room at once. NOTE: This medicine is only for you. Do not share this medicine with others. What if I miss a dose? This does not apply. What may interact with this medicine? -allopurinol This list may not describe all possible interactions. Give your health care provider a list of all the medicines, herbs, non-prescription drugs, or dietary supplements you use. Also tell them if you smoke, drink alcohol, or use illegal drugs. Some items may interact with your medicine. What should I watch for while using this medicine? Your condition will be monitored carefully while you are receiving this medicine. You will need to have regular blood  tests during your treatment. What side effects may I notice from receiving this medicine? Side effects that you should report to your doctor or health care professional as soon as possible: -allergic reactions like skin rash, itching or hives, swelling of the face, lips, or tongue -blue color to lips or nailbeds -breathing problems -chest pain, tightness -fast, irregular heartbeat -feeling faint or lightheaded, falls -fever -low blood pressure -seizures -trouble passing urine or change in the amount of urine -yellowing of the eyes or skin Side effects that usually do not require medical attention (report to your doctor or health care professional if they continue or are bothersome): -constipation or diarrhea -diarrhea -headache -mouth sores -nausea, vomiting This list may not describe all possible side effects. Call your doctor for medical advice about side effects. You may report side effects to FDA at 1-800-FDA-1088. Where should I keep my medicine? This drug is given in a hospital or clinic and will not be stored at home. NOTE: This sheet is a summary. It may not cover all possible information. If you have questions about this medicine, talk to your doctor, pharmacist, or health care provider.  2015, [employer]. (2008-08-11 14:22:08) Palonosetron Injection What is this medicine? PALONOSETRON (pal oh NOE se tron) is used to prevent nausea and vomiting caused by chemotherapy. It also helps prevent delayed nausea and vomiting that may occur a few days after your treatment. This medicine may be used for other purposes; ask your health care provider or pharmacist if you have questions. COMMON BRAND NAME(S): Aloxi What should I tell my health care provider before I take this medicine? They need  to know if you have any of these conditions: -an unusual or allergic reaction to palonosetron, dolasetron, granisetron, ondansetron, other medicines, foods, dyes, or  preservatives -pregnant or trying to get pregnant -breast-feeding How should I use this medicine? This medicine is for infusion into a vein. It is given by a health care professional in a hospital or clinic setting. Talk to your pediatrician regarding the use of this medicine in children. While this drug may be prescribed for children as young as 1 month for selected conditions, precautions do apply. Overdosage: If you think you have taken too much of this medicine contact a poison control center or emergency room at once. NOTE: This medicine is only for you. Do not share this medicine with others. What if I miss a dose? This does not apply. What may interact with this medicine? -certain medicines for depression, anxiety, or psychotic disturbances -fentanyl -linezolid -MAOIs like Carbex, Eldepryl, Marplan, Nardil, and Parnate -methylene blue (injected into a vein) -tramadol This list may not describe all possible interactions. Give your health care provider a list of all the medicines, herbs, non-prescription drugs, or dietary supplements you use. Also tell them if you smoke, drink alcohol, or use illegal drugs. Some items may interact with your medicine. What should I watch for while using this medicine? Your condition will be monitored carefully while you are receiving this medicine. What side effects may I notice from receiving this medicine? Side effects that you should report to your doctor or health care professional as soon as possible: -allergic reactions like skin rash, itching or hives, swelling of the face, lips, or tongue -breathing problems -confusion -dizziness -fast, irregular heartbeat -fever and chills -loss of balance or coordination -seizures -sweating -swelling of the hands and feet -tremors -unusually weak or tired Side effects that usually do not require medical attention (report to your doctor or health care professional if they continue or are  bothersome): -constipation or diarrhea -headache This list may not describe all possible side effects. Call your doctor for medical advice about side effects. You may report side effects to FDA at 1-800-FDA-1088. Where should I keep my medicine? This drug is given in a hospital or clinic and will not be stored at home. NOTE: This sheet is a summary. It may not cover all possible information. If you have questions about this medicine, talk to your doctor, pharmacist, or health care provider.  2015, [employer]. (2013-10-18 10:38:36) Fosaprepitant injection What is this medicine? FOSAPREPITANT (fos ap RE pi tant) is used together with other medicines to prevent nausea and vomiting caused by cancer treatment (chemotherapy). This medicine may be used for other purposes; ask your health care provider or pharmacist if you have questions. COMMON BRAND NAME(S): Emend What should I tell my health care provider before I take this medicine? They need to know if you have any of these conditions: -liver disease -an unusual or allergic reaction to fosaprepitant, aprepitant, medicines, foods, dyes, or preservatives -pregnant or trying to get pregnant -breast-feeding How should I use this medicine? This medicine is for injection into a vein. It is given by a health care professional in a hospital or clinic setting. Talk to your pediatrician regarding the use of this medicine in children. Special care may be needed. Overdosage: If you think you have taken too much of this medicine contact a poison control center or emergency room at once. NOTE: This medicine is only for you. Do not share this medicine with others. What if I miss  a dose? This does not apply. What may interact with this medicine? Do not take this medicine with any of these medicines: -cisapride -pimozide -ranolazine This medicine may also interact with the following medications: -diltiazem -male hormones, like estrogens  or progestins and birth control pills -medicines for fungal infections like ketoconazole and itraconazole -medicines for HIV -medicines for seizures or to control epilepsy like carbamazepine or phenytoin -medicines used for sleep or anxiety disorders like alprazolam, diazepam, or midazolam -nefazodone -paroxetine -rifampin -some chemotherapy medications like etoposide, ifosfamide, vinblastine, vincristine -some antibiotics like clarithromycin, erythromycin, troleandomycin -steroid medicines like dexamethasone or methylprednisolone -tolbutamide -warfarin This list may not describe all possible interactions. Give your health care provider a list of all the medicines, herbs, non-prescription drugs, or dietary supplements you use. Also tell them if you smoke, drink alcohol, or use illegal drugs. Some items may interact with your medicine. What should I watch for while using this medicine? Do not take this medicine if you already have nausea and vomiting. Ask your health care provider what to do if you already have nausea. Birth control pills may not work properly while you are taking this medicine. Talk to your doctor about using an extra method of birth control. This medicine should not be used continuously for a long time. Visit your doctor or health care professional for regular check-ups. This medicine may change your liver function blood test results. What side effects may I notice from receiving this medicine? Side effects that you should report to your doctor or health care professional as soon as possible: -allergic reactions like skin rash, itching or hives, swelling of the face, lips, or tongue -breathing problems -changes in heart rhythm -high or low blood pressure -pain, redness, or irritation at site where injected -rectal bleeding -serious dizziness or disorientation, confusion -sharp or severe stomach pain -sharp pain in your leg Side effects that usually do not require  medical attention (report to your doctor or health care professional if they continue or are bothersome): -constipation or diarrhea -hair loss -headache -hiccups -loss of appetite -nausea -upset stomach -tiredness This list may not describe all possible side effects. Call your doctor for medical advice about side effects. You may report side effects to FDA at 1-800-FDA-1088. Where should I keep my medicine? This drug is given in a hospital or clinic and will not be stored at home. NOTE: This sheet is a summary. It may not cover all possible information. If you have questions about this medicine, talk to your doctor, pharmacist, or health care provider.  2015, [employer]. (2009-11-24 12:46:13) Dexamethasone injection What is this medicine? DEXAMETHASONE (dex a METH a sone) is a corticosteroid. It is used to treat inflammation of the skin, joints, lungs, and other organs. Common conditions treated include asthma, allergies, and arthritis. It is also used for other conditions, like blood disorders and diseases of the adrenal glands. This medicine may be used for other purposes; ask your health care provider or pharmacist if you have questions. COMMON BRAND NAME(S): Decadron, Solurex What should I tell my health care provider before I take this medicine? They need to know if you have any of these conditions: -blood clotting problems -Cushing's syndrome -diabetes -glaucoma -heart problems or disease -high blood pressure -infection like herpes, measles, tuberculosis, or chickenpox -kidney disease -liver disease -mental problems -myasthenia gravis -osteoporosis -previous heart attack -seizures -stomach, ulcer or intestine disease including colitis and diverticulitis -thyroid problem -an unusual or allergic reaction to dexamethasone, corticosteroids, other medicines, lactose, foods, dyes,  or preservatives -pregnant or trying to get pregnant -breast-feeding How should I  use this medicine? This medicine is for injection into a muscle, joint, lesion, soft tissue, or vein. It is given by a health care professional in a hospital or clinic setting. Talk to your pediatrician regarding the use of this medicine in children. Special care may be needed. Overdosage: If you think you have taken too much of this medicine contact a poison control center or emergency room at once. NOTE: This medicine is only for you. Do not share this medicine with others. What if I miss a dose? This may not apply. If you are having a series of injections over a prolonged period, try not to miss an appointment. Call your doctor or health care professional to reschedule if you are unable to keep an appointment. What may interact with this medicine? Do not take this medicine with any of the following medications: -mifepristone, RU-486 -vaccines This medicine may also interact with the following medications: -amphotericin B -antibiotics like clarithromycin, erythromycin, and troleandomycin -aspirin and aspirin-like drugs -barbiturates like phenobarbital -carbamazepine -cholestyramine -cholinesterase inhibitors like donepezil, galantamine, rivastigmine, and tacrine -cyclosporine -digoxin -diuretics -ephedrine -male hormones, like estrogens or progestins and birth control pills -indinavir -isoniazid -ketoconazole -medicines for diabetes -medicines that improve muscle tone or strength for conditions like myasthenia gravis -NSAIDs, medicines for pain and inflammation, like ibuprofen or naproxen -phenytoin -rifampin -thalidomide -warfarin This list may not describe all possible interactions. Give your health care provider a list of all the medicines, herbs, non-prescription drugs, or dietary supplements you use. Also tell them if you smoke, drink alcohol, or use illegal drugs. Some items may interact with your medicine. What should I watch for while using this medicine? Your  condition will be monitored carefully while you are receiving this medicine. If you are taking this medicine for a long time, carry an identification card with your name and address, the type and dose of your medicine, and your doctor's name and address. This medicine may increase your risk of getting an infection. Stay away from people who are sick. Tell your doctor or health care professional if you are around anyone with measles or chickenpox. Talk to your health care provider before you get any vaccines that you take this medicine. If you are going to have surgery, tell your doctor or health care professional that you have taken this medicine within the last twelve months. Ask your doctor or health care professional about your diet. You may need to lower the amount of salt you eat. The medicine can increase your blood sugar. If you are a diabetic check with your doctor if you need help adjusting the dose of your diabetic medicine. What side effects may I notice from receiving this medicine? Side effects that you should report to your doctor or health care professional as soon as possible: -allergic reactions like skin rash, itching or hives, swelling of the face, lips, or tongue -black or tarry stools -change in the amount of urine -changes in vision -confusion, excitement, restlessness, a false sense of well-being -fever, sore throat, sneezing, cough, or other signs of infection, wounds that will not heal -hallucinations -increased thirst -mental depression, mood swings, mistaken feelings of self importance or of being mistreated -pain in hips, back, ribs, arms, shoulders, or legs -pain, redness, or irritation at the injection site -redness, blistering, peeling or loosening of the skin, including inside the mouth -rounding out of face -swelling of feet or lower legs -unusual bleeding or  bruising -unusual tired or weak -wounds that do not heal Side effects that usually do not require  medical attention (report to your doctor or health care professional if they continue or are bothersome): -diarrhea or constipation -change in taste -headache -nausea, vomiting -skin problems, acne, thin and shiny skin -touble sleeping -unusual growth of hair on the face or body -weight gain This list may not describe all possible side effects. Call your doctor for medical advice about side effects. You may report side effects to FDA at 1-800-FDA-1088. Where should I keep my medicine? This drug is given in a hospital or clinic and will not be stored at home. NOTE: This sheet is a summary. It may not cover all possible information. If you have questions about this medicine, talk to your doctor, pharmacist, or health care provider.  2015, [employer] Standard. (2008-04-03 14:04:12) Acetaminophen tablets or caplets What is this medicine? ACETAMINOPHEN (a set a MEE noe fen) is a pain reliever. It is used to treat mild pain and fever. This medicine may be used for other purposes; ask your health care provider or pharmacist if you have questions. COMMON BRAND NAME(S): Aceta, Actamin, Anacin Aspirin Free, Genapap, Genebs, Mapap, Pain & Fever, Pain and Fever, PAIN RELIEF, PAIN RELIEF Extra Strength, Pain Reliever, Panadol, PHARBETOL, Q-Pap, Q-Pap Extra Strength, Tylenol, Tylenol CrushableTablet, Tylenol Extra Strength, XS No Aspirin, XS Pain Reliever What should I tell my health care provider before I take this medicine? They need to know if you have any of these conditions: -if you often drink alcohol -liver disease -an unusual or allergic reaction to acetaminophen, other medicines, foods, dyes, or preservatives -pregnant or trying to get pregnant -breast-feeding How should I use this medicine? Take this medicine by mouth with a glass of water. Follow the directions on the package or prescription label. Take your medicine at regular intervals. Do not take your medicine more often than  directed. Talk to your pediatrician regarding the use of this medicine in children. While this drug may be prescribed for children as young as 50 years of age for selected conditions, precautions do apply. Overdosage: If you think you have taken too much of this medicine contact a poison control center or emergency room at once. NOTE: This medicine is only for you. Do not share this medicine with others. What if I miss a dose? If you miss a dose, take it as soon as you can. If it is almost time for your next dose, take only that dose. Do not take double or extra doses. What may interact with this medicine? -alcohol -imatinib -isoniazid -other medicines with acetaminophen This list may not describe all possible interactions. Give your health care provider a list of all the medicines, herbs, non-prescription drugs, or dietary supplements you use. Also tell them if you smoke, drink alcohol, or use illegal drugs. Some items may interact with your medicine. What should I watch for while using this medicine? Tell your doctor or health care professional if the pain lasts more than 10 days (5 days for children), if it gets worse, or if there is a new or different kind of pain. Also, check with your doctor if a fever lasts for more than 3 days. Do not take other medicines that contain acetaminophen with this medicine. Always read labels carefully. If you have questions, ask your doctor or pharmacist. If you take too much acetaminophen get medical help right away. Too much acetaminophen can be very dangerous and cause liver damage. Even if  you do not have symptoms, it is important to get help right away. What side effects may I notice from receiving this medicine? Side effects that you should report to your doctor or health care professional as soon as possible: -allergic reactions like skin rash, itching or hives, swelling of the face, lips, or tongue -breathing problems -fever or sore throat -redness,  blistering, peeling or loosening of the skin, including inside the mouth -trouble passing urine or change in the amount of urine -unusual bleeding or bruising -unusually weak or tired -yellowing of the eyes or skin Side effects that usually do not require medical attention (report to your doctor or health care professional if they continue or are bothersome): -headache -nausea, stomach upset This list may not describe all possible side effects. Call your doctor for medical advice about side effects. You may report side effects to FDA at 1-800-FDA-1088. Where should I keep my medicine? Keep out of reach of children. Store at room temperature between 20 and 25 degrees C (68 and 77 degrees F). Protect from moisture and heat. Throw away any unused medicine after the expiration date. NOTE: This sheet is a summary. It may not cover all possible information. If you have questions about this medicine, talk to your doctor, pharmacist, or health care provider.  2015, [employer] Standard. (2013-08-05 12:54:16) Diphenhydramine capsules or tablets What is this medicine? DIPHENHYDRAMINE (dye fen HYE dra meen) is an antihistamine. It is used to treat the symptoms of an allergic reaction. It is also used to treat Parkinson's disease. This medicine is also used to prevent and to treat motion sickness and as a nighttime sleep aid. This medicine may be used for other purposes; ask your health care provider or pharmacist if you have questions. COMMON BRAND NAME(S): Alka-Seltzer Plus Allergy, Banophen, Benadryl Allergy, Benadryl Allergy Dye Free, Benadryl Allergy Kapgel, Benadryl Allergy Ultratab, Diphedryl, Diphenhist, Genahist, PHARBEDRYL, Q-Dryl, Gretta Began, Valu-Dryl, Vicks ZzzQuil Nightime Sleep-Aid What should I tell my health care provider before I take this medicine? They need to know if you have any of these conditions: -asthma or lung disease -glaucoma -high blood pressure or heart disease -liver  disease -pain or difficulty passing urine -prostate trouble -ulcers or other stomach problems -an unusual or allergic reaction to diphenhydramine, other medicines foods, dyes, or preservatives such as sulfites -pregnant or trying to get pregnant -breast-feeding How should I use this medicine? Take this medicine by mouth with a full glass of water. Follow the directions on the prescription label. Take your doses at regular intervals. Do not take your medicine more often than directed. To prevent motion sickness start taking this medicine 30 to 60 minutes before you leave. Talk to your pediatrician regarding the use of this medicine in children. Special care may be needed. Patients over 91 years old may have a stronger reaction and need a smaller dose. Overdosage: If you think you have taken too much of this medicine contact a poison control center or emergency room at once. NOTE: This medicine is only for you. Do not share this medicine with others. What if I miss a dose? If you miss a dose, take it as soon as you can. If it is almost time for your next dose, take only that dose. Do not take double or extra doses. What may interact with this medicine? Do not take this medicine with any of the following medications: -MAOIs like Carbex, Eldepryl, Marplan, Nardil, and Parnate This medicine may also interact with the following medications: -alcohol -barbiturates,  like phenobarbital -medicines for bladder spasm like oxybutynin, tolterodine -medicines for blood pressure -medicines for depression, anxiety, or psychotic disturbances -medicines for movement abnormalities or Parkinson's disease -medicines for sleep -other medicines for cold, cough or allergy -some medicines for the stomach like chlordiazepoxide, dicyclomine This list may not describe all possible interactions. Give your health care provider a list of all the medicines, herbs, non-prescription drugs, or dietary supplements you use.  Also tell them if you smoke, drink alcohol, or use illegal drugs. Some items may interact with your medicine. What should I watch for while using this medicine? Visit your doctor or health care professional for regular check ups. Tell your doctor if your symptoms do not improve or if they get worse. Your mouth may get dry. Chewing sugarless gum or sucking hard candy, and drinking plenty of water may help. Contact your doctor if the problem does not go away or is severe. This medicine may cause dry eyes and blurred vision. If you wear contact lenses you may feel some discomfort. Lubricating drops may help. See your eye doctor if the problem does not go away or is severe. You may get drowsy or dizzy. Do not drive, use machinery, or do anything that needs mental alertness until you know how this medicine affects you. Do not stand or sit up quickly, especially if you are an older patient. This reduces the risk of dizzy or fainting spells. Alcohol may interfere with the effect of this medicine. Avoid alcoholic drinks. What side effects may I notice from receiving this medicine? Side effects that you should report to your doctor or health care professional as soon as possible: -allergic reactions like skin rash, itching or hives, swelling of the face, lips, or tongue -changes in vision -confused, agitated, nervous -irregular or fast heartbeat -tremor -trouble passing urine -unusual bleeding or bruising -unusually weak or tired Side effects that usually do not require medical attention (report to your doctor or health care professional if they continue or are bothersome): -constipation, diarrhea -drowsy -headache -loss of appetite -stomach upset, vomiting -thick mucous This list may not describe all possible side effects. Call your doctor for medical advice about side effects. You may report side effects to FDA at 1-800-FDA-1088. Where should I keep my medicine? Keep out of the reach of  children. Store at room temperature between 15 and 30 degrees C (59 and 86 degrees F). Keep container closed tightly. Throw away any unused medicine after the expiration date. NOTE: This sheet is a summary. It may not cover all possible information. If you have questions about this medicine, talk to your doctor, pharmacist, or health care provider.  2015, [employer] Standard. (2008-03-31 17:06:22) Lidocaine; Prilocaine cream What is this medicine? LIDOCAINE; PRILOCAINE (LYE doe kane; PRIL oh kane) is a topical anesthetic that causes loss of feeling in the skin and surrounding tissues. It is used to numb the skin before procedures or injections. This medicine may be used for other purposes; ask your health care provider or pharmacist if you have questions. COMMON BRAND NAME(S): EMLA What should I tell my health care provider before I take this medicine? They need to know if you have any of these conditions: -glucose-6-phosphate deficiencies -heart disease -kidney or liver disease -methemoglobinemia -an unusual or allergic reaction to lidocaine, prilocaine, other medicines, foods, dyes, or preservatives -pregnant or trying to get pregnant -breast-feeding How should I use this medicine? This medicine is for external use only on the skin. Do not take by mouth. Follow the  directions on the prescription label. Wash hands before and after use. Do not use more or leave in contact with the skin longer than directed. Do not apply to eyes or open wounds. It can cause irritation and blurred or temporary loss of vision. If this medicine comes in contact with your eyes, immediately rinse the eye with water. Do not touch or rub the eye. Contact your health care provider right away. Talk to your pediatrician regarding the use of this medicine in children. While this medicine may be prescribed for children for selected conditions, precautions do apply. Overdosage: If you think you have taken too much of this  medicine contact a poison control center or emergency room at once. NOTE: This medicine is only for you. Do not share this medicine with others. What if I miss a dose? This medicine is usually only applied once prior to each procedure. It must be in contact with the skin for a period of time for it to work. If you applied this medicine later than directed, tell your health care professional before starting the procedure. What may interact with this medicine? -acetaminophen -chloroquine -dapsone -medicines to control heart rhythm -nitrates like nitroglycerin and nitroprusside -other ointments, creams, or sprays that may contain anesthetic medicine -phenobarbital -phenytoin -quinine -sulfonamides like sulfacetamide, sulfamethoxazole, sulfasalazine and others This list may not describe all possible interactions. Give your health care provider a list of all the medicines, herbs, non-prescription drugs, or dietary supplements you use. Also tell them if you smoke, drink alcohol, or use illegal drugs. Some items may interact with your medicine. What should I watch for while using this medicine? Be careful to avoid injury to the treated area while it is numb and you are not aware of pain. Avoid scratching, rubbing, or exposing the treated area to hot or cold temperatures until complete sensation has returned. The numb feeling will wear off a few hours after applying the cream. What side effects may I notice from receiving this medicine? Side effects that you should report to your doctor or health care professional as soon as possible: -blurred vision -chest pain -difficulty breathing -dizziness -drowsiness -fast or irregular heartbeat -skin rash or itching -swelling of your throat, lips, or face -trembling Side effects that usually do not require medical attention (report to your doctor or health care professional if they continue or are bothersome): -changes in ability to feel hot or  cold -redness and swelling at the application site This list may not describe all possible side effects. Call your doctor for medical advice about side effects. You may report side effects to FDA at 1-800-FDA-1088. Where should I keep my medicine? Keep out of reach of children. Store at room temperature between 15 and 30 degrees C (59 and 86 degrees F). Keep container tightly closed. Throw away any unused medicine after the expiration date. NOTE: This sheet is a summary. It may not cover all possible information. If you have questions about this medicine, talk to your doctor, pharmacist, or health care provider.  2015, [employer]. (2008-06-16 17:14:35) Ondansetron tablets What is this medicine? ONDANSETRON (on DAN se tron) is used to treat nausea and vomiting caused by chemotherapy. It is also used to prevent or treat nausea and vomiting after surgery. This medicine may be used for other purposes; ask your health care provider or pharmacist if you have questions. COMMON BRAND NAME(S): Zofran What should I tell my health care provider before I take this medicine? They need to know if you  have any of these conditions: -heart disease -history of irregular heartbeat -liver disease -low levels of magnesium or potassium in the blood -an unusual or allergic reaction to ondansetron, granisetron, other medicines, foods, dyes, or preservatives -pregnant or trying to get pregnant -breast-feeding How should I use this medicine? Take this medicine by mouth with a glass of water. Follow the directions on your prescription label. Take your doses at regular intervals. Do not take your medicine more often than directed. Talk to your pediatrician regarding the use of this medicine in children. Special care may be needed. Overdosage: If you think you have taken too much of this medicine contact a poison control center or emergency room at once. NOTE: This medicine is only for you. Do not share  this medicine with others. What if I miss a dose? If you miss a dose, take it as soon as you can. If it is almost time for your next dose, take only that dose. Do not take double or extra doses. What may interact with this medicine? Do not take this medicine with any of the following medications: -apomorphine -certain medicines for fungal infections like fluconazole, itraconazole, ketoconazole, posaconazole, voriconazole -cisapride -dofetilide -dronedarone -pimozide -thioridazine -ziprasidone This medicine may also interact with the following medications: -carbamazepine -certain medicines for depression, anxiety, or psychotic disturbances -fentanyl -linezolid -MAOIs like Carbex, Eldepryl, Marplan, Nardil, and Parnate -methylene blue (injected into a vein) -other medicines that prolong the QT interval (cause an abnormal heart rhythm) -phenytoin -rifampicin -tramadol This list may not describe all possible interactions. Give your health care provider a list of all the medicines, herbs, non-prescription drugs, or dietary supplements you use. Also tell them if you smoke, drink alcohol, or use illegal drugs. Some items may interact with your medicine. What should I watch for while using this medicine? Check with your doctor or health care professional right away if you have any sign of an allergic reaction. What side effects may I notice from receiving this medicine? Side effects that you should report to your doctor or health care professional as soon as possible: -allergic reactions like skin rash, itching or hives, swelling of the face, lips or tongue -breathing problems -confusion -dizziness -fast or irregular heartbeat -feeling faint or lightheaded, falls -fever and chills -loss of balance or coordination -seizures -sweating -swelling of the hands or feet -tightness in the chest -tremors -unusually weak or tired Side effects that usually do not require medical attention  (report to your doctor or health care professional if they continue or are bothersome): -constipation or diarrhea -headache This list may not describe all possible side effects. Call your doctor for medical advice about side effects. You may report side effects to FDA at 1-800-FDA-1088. Where should I keep my medicine? Keep out of the reach of children. Store between 2 and 30 degrees C (36 and 86 degrees F). Throw away any unused medicine after the expiration date. NOTE: This sheet is a summary. It may not cover all possible information. If you have questions about this medicine, talk to your doctor, pharmacist, or health care provider.  2015, [employer]. (2013-09-18 16:27:45) Prochlorperazine tablets What is this medicine? PROCHLORPERAZINE (proe klor PER a zeen) helps to control severe nausea and vomiting. This medicine is also used to treat schizophrenia. It can also help patients who experience anxiety that is not due to psychological illness. This medicine may be used for other purposes; ask your health care provider or pharmacist if you have questions. COMMON BRAND NAME(S): Compazine  What should I tell my health care provider before I take this medicine? They need to know if you have any of these conditions: -blood disorders or disease -dementia -liver disease or jaundice -Parkinson's disease -uncontrollable movement disorder -an unusual or allergic reaction to prochlorperazine, other medicines, foods, dyes, or preservatives -pregnant or trying to get pregnant -breast-feeding How should I use this medicine? Take this medicine by mouth with a glass of water. Follow the directions on the prescription label. Take your doses at regular intervals. Do not take your medicine more often than directed. Do not stop taking this medicine suddenly. This can cause nausea, vomiting, and dizziness. Ask your doctor or health care professional for advice. Talk to your pediatrician regarding  the use of this medicine in children. Special care may be needed. While this drug may be prescribed for children as young as 2 years for selected conditions, precautions do apply. Overdosage: If you think you have taken too much of this medicine contact a poison control center or emergency room at once. NOTE: This medicine is only for you. Do not share this medicine with others. What if I miss a dose? If you miss a dose, take it as soon as you can. If it is almost time for your next dose, take only that dose. Do not take double or extra doses. What may interact with this medicine? Do not take this medicine with any of the following medications: -amoxapine -antidepressants like citalopram, escitalopram, fluoxetine, paroxetine, and sertraline -deferoxamine -dofetilide -maprotiline -tricyclic antidepressants like amitriptyline, clomipramine, imipramine, nortiptyline and others This medicine may also interact with the following medications: -lithium -medicines for pain -phenytoin -propranolol -warfarin This list may not describe all possible interactions. Give your health care provider a list of all the medicines, herbs, non-prescription drugs, or dietary supplements you use. Also tell them if you smoke, drink alcohol, or use illegal drugs. Some items may interact with your medicine. What should I watch for while using this medicine? Visit your doctor or health care professional for regular checks on your progress. You may get drowsy or dizzy. Do not drive, use machinery, or do anything that needs mental alertness until you know how this medicine affects you. Do not stand or sit up quickly, especially if you are an older patient. This reduces the risk of dizzy or fainting spells. Alcohol may interfere with the effect of this medicine. Avoid alcoholic drinks. This medicine can reduce the response of your body to heat or cold. Dress warm in cold weather and stay hydrated in hot weather. If possible,  avoid extreme temperatures like saunas, hot tubs, very hot or cold showers, or activities that can cause dehydration such as vigorous exercise. This medicine can make you more sensitive to the sun. Keep out of the sun. If you cannot avoid being in the sun, wear protective clothing and use sunscreen. Do not use sun lamps or tanning beds/booths. Your mouth may get dry. Chewing sugarless gum or sucking hard candy, and drinking plenty of water may help. Contact your doctor if the problem does not go away or is severe. What side effects may I notice from receiving this medicine? Side effects that you should report to your doctor or health care professional as soon as possible: -blurred vision -breast enlargement in men or women -breast milk in women who are not breast-feeding -chest pain, fast or irregular heartbeat -confusion, restlessness -dark yellow or brown urine -difficulty breathing or swallowing -dizziness or fainting spells -drooling, shaking, movement difficulty (shuffling  walk) or rigidity -fever, chills, sore throat -involuntary or uncontrollable movements of the eyes, mouth, head, arms, and legs -seizures -stomach area pain -unusually weak or tired -unusual bleeding or bruising -yellowing of skin or eyes Side effects that usually do not require medical attention (report to your doctor or health care professional if they continue or are bothersome): -difficulty passing urine -difficulty sleeping -headache -sexual dysfunction -skin rash, or itching This list may not describe all possible side effects. Call your doctor for medical advice about side effects. You may report side effects to FDA at 1-800-FDA-1088. Where should I keep my medicine? Keep out of the reach of children. Store at room temperature between 15 and 30 degrees C (59 and 86 degrees F). Protect from light. Throw away any unused medicine after the expiration date. NOTE: This sheet is a summary. It may not cover all  possible information. If you have questions about this medicine, talk to your doctor, pharmacist, or health care provider.  2015, [employer]. (2012-05-01 16:59:39) Allopurinol tablets What is this medicine? ALLOPURINOL (al oh PURE i nole) reduces the amount of uric acid the body makes. It is used to treat the symptoms of gout. It is also used to treat or prevent high uric acid levels that occur as a result of certain types of chemotherapy. This medicine may also help patients who frequently have kidney stones. This medicine may be used for other purposes; ask your health care provider or pharmacist if you have questions. COMMON BRAND NAME(S): Zyloprim What should I tell my health care provider before I take this medicine? They need to know if you have any of these conditions: -kidney or liver disease -an unusual or allergic reaction to allopurinol, other medicines, foods, dyes, or preservatives -pregnant or trying to get pregnant -breast feeding How should I use this medicine? Take this medicine by mouth with a glass of water. Follow the directions on the prescription label. If this medicine upsets your stomach, take it with food or milk. Take your doses at regular intervals. Do not take your medicine more often than directed. Talk to your pediatrician regarding the use of this medicine in children. Special care may be needed. While this drug may be prescribed for children as young as 6 years for selected conditions, precautions do apply. Overdosage: If you think you have taken too much of this medicine contact a poison control center or emergency room at once. NOTE: This medicine is only for you. Do not share this medicine with others. What if I miss a dose? If you miss a dose, take it as soon as you can. If it is almost time for your next dose, take only that dose. Do not take double or extra doses. What may interact with this medicine? Do not take this medicine with the following  medication: -didanosine, ddI This medicine may also interact with the following medications: -amoxicillin or ampicillin -azathioprine -certain medicines used to treat gout -certain types of diuretics -chlorpropamide -cyclosporine -dicumarol -mercaptopurine -tolbutamide -warfarin This list may not describe all possible interactions. Give your health care provider a list of all the medicines, herbs, non-prescription drugs, or dietary supplements you use. Also tell them if you smoke, drink alcohol, or use illegal drugs. Some items may interact with your medicine. What should I watch for while using this medicine? Visit your doctor or health care professional for regular checks on your progress. If you are taking this medicine to treat gout, you may not have less frequent attacks  at first. Keep taking your medicine regularly and the attacks should get better within 2 to 6 weeks. Drink plenty of water (10 to 12 full glasses a day) while you are taking this medicine. This will help to reduce stomach upset and reduce the risk of getting gout or kidney stones. Call your doctor or health care professional at once if you get a skin rash together with chills, fever, sore throat, or nausea and vomiting, if you have blood in your urine, or difficulty passing urine. Do not take vitamin C without asking your doctor or health care professional. Too much vitamin C can increase the chance of getting kidney stones. You may get drowsy or dizzy. Do not drive, use machinery, or do anything that needs mental alertness until you know how this drug affects you. Do not stand or sit up quickly, especially if you are an older patient. This reduces the risk of dizzy or fainting spells. Alcohol can make you more drowsy and dizzy. Alcohol can also increase the chance of stomach problems and increase the amount of uric acid in your blood. Avoid alcoholic drinks. What side effects may I notice from receiving this medicine? Side  effects that you should report to your doctor or health care professional as soon as possible: -allergic reactions like skin rash, itching or hives, swelling of the face, lips, or tongue -breathing problems -muscle aches or pains -redness, blistering, peeling or loosening of the skin, including inside the mouth Side effects that usually do not require medical attention (report to your doctor or health care professional if they continue or are bothersome): -changes in taste -diarrhea -indigestion -stomach pain or cramps This list may not describe all possible side effects. Call your doctor for medical advice about side effects. You may report side effects to FDA at 1-800-FDA-1088. Where should I keep my medicine? Keep out of the reach of children. Store at room temperature between 15 and 25 degrees C (59 and 77 degrees F). Protect from light and moisture. Throw away any unused medicine after the expiration date. NOTE: This sheet is a summary. It may not cover all possible information. If you have questions about this medicine, talk to your doctor, pharmacist, or health care provider.  2015, [employer]. (2008-06-16 14:26:54) Pegfilgrastim injection What is this medicine? PEGFILGRASTIM (peg fil GRA stim) is a long-acting granulocyte colony-stimulating factor that stimulates the growth of neutrophils, a type of white blood cell important in the body's fight against infection. It is used to reduce the incidence of fever and infection in patients with certain types of cancer who are receiving chemotherapy that affects the bone marrow. This medicine may be used for other purposes; ask your health care provider or pharmacist if you have questions. COMMON BRAND NAME(S): Neulasta What should I tell my health care provider before I take this medicine? They need to know if you have any of these conditions: -latex allergy -ongoing radiation therapy -sickle cell disease -skin reactions to  acrylic adhesives (On-Body Injector only) -an unusual or allergic reaction to pegfilgrastim, filgrastim, other medicines, foods, dyes, or preservatives -pregnant or trying to get pregnant -breast-feeding How should I use this medicine? This medicine is for injection under the skin. If you get this medicine at home, you will be taught how to prepare and give the pre-filled syringe or how to use the On-body Injector. Refer to the patient Instructions for Use for detailed instructions. Use exactly as directed. Take your medicine at regular intervals. Do not take  your medicine more often than directed. It is important that you put your used needles and syringes in a special sharps container. Do not put them in a trash can. If you do not have a sharps container, call your pharmacist or healthcare provider to get one. Talk to your pediatrician regarding the use of this medicine in children. Special care may be needed. Overdosage: If you think you have taken too much of this medicine contact a poison control center or emergency room at once. NOTE: This medicine is only for you. Do not share this medicine with others. What if I miss a dose? It is important not to miss your dose. Call your doctor or health care professional if you miss your dose. If you miss a dose due to an On-body Injector failure or leakage, a new dose should be administered as soon as possible using a single prefilled syringe for manual use. What may interact with this medicine? Interactions have not been studied. Give your health care provider a list of all the medicines, herbs, non-prescription drugs, or dietary supplements you use. Also tell them if you smoke, drink alcohol, or use illegal drugs. Some items may interact with your medicine. This list may not describe all possible interactions. Give your health care provider a list of all the medicines, herbs, non-prescription drugs, or dietary supplements you use. Also tell them if you  smoke, drink alcohol, or use illegal drugs. Some items may interact with your medicine. What should I watch for while using this medicine? You may need blood work done while you are taking this medicine. If you are going to need a MRI, CT scan, or other procedure, tell your doctor that you are using this medicine (On-Body Injector only). What side effects may I notice from receiving this medicine? Side effects that you should report to your doctor or health care professional as soon as possible: -allergic reactions like skin rash, itching or hives, swelling of the face, lips, or tongue -dizziness -fever -pain, redness, or irritation at site where injected -pinpoint red spots on the skin -shortness of breath or breathing problems -stomach or side pain, or pain at the shoulder -swelling -tiredness -trouble passing urine Side effects that usually do not require medical attention (report to your doctor or health care professional if they continue or are bothersome): -bone pain -muscle pain This list may not describe all possible side effects. Call your doctor for medical advice about side effects. You may report side effects to FDA at 1-800-FDA-1088. Where should I keep my medicine? Keep out of the reach of children. Store pre-filled syringes in a refrigerator between 2 and 8 degrees C (36 and 46 degrees F). Do not freeze. Keep in carton to protect from light. Throw away this medicine if it is left out of the refrigerator for more than 48 hours. Throw away any unused medicine after the expiration date. NOTE: This sheet is a summary. It may not cover all possible information. If you have questions about this medicine, talk to your doctor, pharmacist, or health care provider.  2015, [employer]. (2014-03-13 16:14:05)

## 2015-09-17 NOTE — Progress Notes (Signed)
[facility] PROGRESS NOTE  [name] BZJ:[id number] DOB: [birth year] DOA: 09/13/2015 PCP: [name], MD  Assessment/Plan: 1. COPD exacerbation, improving. Feels that wheezing and SOB is improving. Will continue steroids, nebs and abx.   2. Chronic respiratory failure with hypoxia likely related to COPD. Will continue supplemental O2.  3. Possible HCAP, remains afebrile with no hypoxia. Lactic acid was elevated but after discussion with PCCM, it did not correlate with clinical condition therefore no further trending was continued. No evidence of sepsis. BC has shown no growth in three days. Will deescalate abx to Levaquin. 4. Recurrent Large right pleural effusion, likely related to lymphoma. Thoracentesis on 9/20 with removal of 2L of fluids. Per patient last thoracentesis was appr. 3 weeks ago. Anticipate that his recurrent pleural effusion should improve with treatment of lymphoma. 5. Atrial fibrillation with RVR, rate controlled with Diltiazem/digoxin. Not on any anticoagulation at this time. This is being readdressed by oncology. May possibly start Xarelto as an outpatient. 6. Thrombocytopenia, acute on chronic. Platelet count is at 78.  7. Chronic diastolic heart failure, compensated and stable. LVEF 04/2015 65-70%. No lower extremity edema 8. Moderate aortic stenosis 9. Diffuse large B cell lymphoma. Confirmed on lymphnode biopsy. Plans for bone marrow biopsy for staging purposes per oncology. He will also receive a MUGA scan to assess cardiac function He has been started on Allopurinol in order to prevent tumor lysis syndrome. S/p recent port-a-cath placement on 9/18 by Dr. Arnoldo Morale. Oncology is following.  10. Rheumatoid arthritis on MTX and etanercept.   Code Status: Full DVT prophylaxis: SCD Family Communication: Discussed with patient who understands and has no concerns at this time. Disposition Plan: Discharge home within 24  hours.  Consultants:  Oncology  Procedures:  Thoracentesis 9/20 with removal of 2L fluids.   Antibiotics:  Vancomycin 9/18>>9/22  Zosyn x1 on 9/18>>9/22  fortaz from 9/18>>9/22  Levaquin 9/22>>  HPI/Subjective: Breathing is better. Denies cough. Wheezing is intermittent. Reports an episode of chest pain today.  Objective: Filed Vitals:   09/17/15 0604  BP: 150/84  Pulse: 89  Temp: 97.6 F (36.4 C)  Resp: 20    Intake/Output Summary (Last 24 hours) at 09/17/15 0640 Last data filed at 09/17/15 0175  Gross per 24 hour  Intake   2200 ml  Output    525 ml  Net   1675 ml   Filed Weights   09/15/15 0454 09/16/15 0500 09/17/15 0604  Weight: 94.6 kg (208 lb 8.9 oz) 94.6 kg (208 lb 8.9 oz) 97.342 kg (214 lb 9.6 oz)    Exam: General: NAD, looks comfortable. Sitting up in bed, able to speak in full sentences.  Cardiovascular: Iregular rate. S1, S2  Respiratory: Fair air movement bilaterally. Expiratory wheezes are improving. No r/r Abdomen: soft, ntnd, bowel sounds normal Musculoskeletal: No bilateral LE edema   Data Reviewed: Basic Metabolic Panel:  Recent Labs Lab 09/13/15 0650 09/14/15 0531 09/15/15 0510 09/16/15 0601  NA 138 140 140 139  K 4.1 3.7 3.8 3.8  CL 102 103 105 106  CO2 '28 30 29 29  ' GLUCOSE 81 191* 111* 97  BUN '11 14 16 17  ' CREATININE 0.85 1.14 1.02 1.04  CALCIUM 8.8* 8.4* 8.3* 8.4*   Liver Function Tests:  Recent Labs Lab 09/13/15 0650  AST 37  ALT 20  ALKPHOS 58  BILITOT 2.6*  PROT 7.0  ALBUMIN 3.5   CBC:  Recent Labs Lab 09/13/15 0650 09/15/15 0510  WBC 6.3 5.6  NEUTROABS 4.8  --  HGB 12.6* 11.1*  HCT 37.8* 33.5*  MCV 104.7* 104.7*  PLT 63* 61*   Cardiac Enzymes:  Recent Labs Lab 09/13/15 0650  TROPONINI <0.03   BNP (last 3 results)  Recent Labs  05/20/15 0533 08/22/15 1421 09/13/15 0650  BNP 382.0* 358.0* 595.0*    ProBNP (last 3 results)  CBG:   Recent Results (from the past 240 hour(s))   Blood culture (routine x 2)     Status: None (Preliminary result)   Collection Time: 09/13/15  7:35 AM  Result Value Ref Range Status   Specimen Description BLOOD RIGHT ANTECUBITAL  Final   Special Requests BOTTLES DRAWN AEROBIC AND ANAEROBIC 6CC EACH  Final   Culture NO GROWTH 3 DAYS  Final   Report Status PENDING  Incomplete  Blood culture (routine x 2)     Status: None (Preliminary result)   Collection Time: 09/13/15  7:43 AM  Result Value Ref Range Status   Specimen Description BLOOD LEFT ANTECUBITAL  Final   Special Requests BOTTLES DRAWN AEROBIC AND ANAEROBIC 6CC EACH  Final   Culture NO GROWTH 3 DAYS  Final   Report Status PENDING  Incomplete  MRSA PCR Screening     Status: None   Collection Time: 09/13/15 12:00 PM  Result Value Ref Range Status   MRSA by PCR NEGATIVE NEGATIVE Final    Comment:        The GeneXpert MRSA Assay (FDA approved for NASAL specimens only), is one component of a comprehensive MRSA colonization surveillance program. It is not intended to diagnose MRSA infection nor to guide or monitor treatment for MRSA infections.   Gram stain     Status: None   Collection Time: 09/15/15  3:54 PM  Result Value Ref Range Status   Specimen Description FLUID RIGHT PLEURAL  Final   Special Requests NONE  Final   Gram Stain   Final    CYTOSPIN SMEAR WBC PRESENT, PREDOMINANTLY MONONUCLEAR NO ORGANISMS SEEN Performed at [facility]    Report Status 09/15/2015 FINAL  Final  Culture, body fluid-bottle     Status: None (Preliminary result)   Collection Time: 09/15/15  3:54 PM  Result Value Ref Range Status   Specimen Description FLUID RIGHT PLEURAL  Final   Special Requests BOTTLES DRAWN AEROBIC AND ANAEROBIC 10CC EACH  Final   Culture NO GROWTH < 24 HOURS  Final   Report Status PENDING  Incomplete     Studies: Dg Chest 1 View  09/15/2015   CLINICAL DATA:  Status post thoracentesis  EXAM: CHEST  1 VIEW  COMPARISON:  09/13/15  FINDINGS: There is  moderate cardiac enlargement. Left chest wall port a catheter is noted with tip in the projection of the SVC. Cardiac enlargement is noted. There has been decrease in volume of right pleural effusion. No significant pneumothorax identified. Similar appearance of diffuse pulmonary edema.  IMPRESSION: 1. No pneumothorax after right thoracentesis.   Electronically Signed   By: Kerby Moors M.D.   On: 09/15/2015 14:12   US Thoracentesis Asp Pleural Space W/img Guide  09/15/2015   CLINICAL DATA:  Recurrent right pleural effusion  EXAM: ULTRASOUND GUIDED RIGHT THORACENTESIS  COMPARISON:  None.  PROCEDURE: An ultrasound guided thoracentesis was thoroughly discussed with the patient and questions answered. The benefits, risks, alternatives and complications were also discussed. The patient understands and wishes to proceed with the procedure. Written consent was obtained.  Ultrasound was performed to localize and mark an adequate pocket of fluid in  the right posterior chest. The area was then prepped and draped in the normal sterile fashion. 1% Lidocaine was used for local anesthesia. Under ultrasound guidance a 19 gauge Yueh catheter was introduced. Thoracentesis was performed. The catheter was removed and a dressing applied.  COMPLICATIONS: None.  FINDINGS: A total of approximately 2 L of amber fluid was removed. A fluid sample wassent for laboratory analysis.  IMPRESSION: Successful ultrasound guided right thoracentesis yielding 2 L of pleural fluid.   Electronically Signed   By: Rolm Baptise M.D.   On: 09/15/2015 14:48    Scheduled Meds: . allopurinol  300 mg Oral Daily  . aspirin EC  81 mg Oral Daily  . cefTAZidime (FORTAZ)  IV  2 g Intravenous 3 times per day  . digoxin  0.125 mg Oral Daily  . diltiazem  360 mg Oral Daily  . gabapentin  600 mg Oral BID  . guaiFENesin  1,200 mg Oral BID  . ipratropium-albuterol  3 mL Nebulization TID  . loratadine  10 mg Oral Daily  . methylPREDNISolone (SOLU-MEDROL)  injection  60 mg Intravenous Q6H  . oxyCODONE-acetaminophen  1 tablet Oral Q6H   And  . oxyCODONE  5 mg Oral Q6H  . pantoprazole  40 mg Oral Daily  . sodium chloride  3 mL Intravenous Q12H  . tamsulosin  0.4 mg Oral QHS  . vancomycin  750 mg Intravenous Q12H   Continuous Infusions:   Principal Problem:   COPD with acute exacerbation Active Problems:   Aortic stenosis, moderate   Acute diastolic CHF (congestive heart failure)   Atrial fibrillation with RVR   HCAP (healthcare-associated pneumonia)   Recurrent right pleural effusion   Chronic diastolic CHF (congestive heart failure)    Time spent: 30 minutes     Kathie Dike, MD  [facility] Pager [phone]. If 7PM-7AM, please contact night-coverage at [email or web address], password [facility] 09/17/2015, 6:40 AM  LOS: 4 days      By signing my name below, I, Rhett Bannister attest that this documentation has been prepared under the direction and in the presence of Kathie Dike, M.D.  Electronically signed: Rhett Bannister  09/17/2015    I, Dr. Kathie Dike, personally performed the services described in this documentaiton. All medical record entries made by the scribe were at my direction and in my presence. I have reviewed the chart and agree that the record reflects my personal performance and is accurate and complete  Kathie Dike, MD, 09/17/2015 6:40 AM

## 2015-09-17 NOTE — Progress Notes (Addendum)
Patient ID: [name], male   DOB: 02/07/1931, 81 y.o.   MRN: [id number]   Pt scheduled for BM bx at [facility] 9/23 To be at [facility] via ambulance by 830 am 9/23  [facility]  See orders RN aware RN to arrange ambulance transport

## 2015-09-17 NOTE — Consult Note (Signed)
_0 @       [facility] Hematology/Oncology Consultation   Name: [name]      MRN: [id number]    Location: A303/A303-01  Date: 09/17/2015 Time:7:56 AM   REFERRING PHYSICIAN:  Florencia Reasons, MD  REASON FOR CONSULT:  lung ca? recurrent plerual effusion   DIAGNOSIS:  Diffuse B Cell Non-Hodgkin Lymphoma with thoracentesis on 08/24/2015 demonstrating a lymphoproliferative process on cytology.   HISTORY OF PRESENT ILLNESS:   [name] is an 81 year old white American man who is well known to the [facility] where he was recently worked-up for lymphoma.  He is S/P excisional lymph node biopsy by Dr. Arnoldo Morale on 09/11/2015 and port placement.  Pathology proves diffuse B-cell lymphoma.    Diffuse large B cell lymphoma   08/22/2015 - 08/25/2015 Hospital Admission Acute respiratory distress   08/22/2015 Imaging CTA chest- Right infrahilar lymph node versus central lung mass measuring 2.8 x 4.1 cm. Worsening mediastinal adenopathy and bilateral hilar adenopathy as described with the largest node over the subcarinal region measuring 2.7 cm by short axis...   08/24/2015 Pathology Results Diagnosis PLEURAL FLUID, RIGHT(SPECIMEN 1 OF 1 COLLECTED 08/24/15): ATYPICAL LYMPHOCYTES SUSPICIOUS FOR A LYMPHOPROLIFERATIVE PROCESS, SEE COMMENT. [name]   08/25/2015 Imaging CT abd/pelvis- Mild upper retroperitoneal (gastrohepatic ligament and right retrocrural) lymphadenopathy. Cirrhosis.   09/03/2015 PET scan Widespread metastatic adenopathy including the RIGHT cervical lymph nodes, LEFT axillary lymph nodes, mediastinal lymph, hilar lymph node, retroperitoneal lymph nodes and iliac lymph nodes and inguinal lymph nodes. Inguinal lymph nodes may be most....   09/11/2015 Pathology Results Diagnosis Lymph node, biopsy, right inguinal - DIFFUSE LARGE B CELL LYMPHOMA.   09/11/2015 Pathology Results Interpretation Tissue-Flow Cytometry - MONOCLONAL B-CELL POPULATION IDENTIFIED. Diagnosis Comment: The  findings are consistent with non-Hodgkin B-cell lymphoma. (BNS:ecj 09/16/2015)   09/13/2015 -  Hospital Admission COPD exacerbation    On review of medical record, there is question regarding lung cancer and malignant pleural effusion.  However, 3 weeks ago, he underwent a thoracentesis that was unremarkable for lung cancer, but did demonstrate a lymphoproliferative process.  He does not have bronchogenic carcinoma.  I personally reviewed and went over pathology results with the patient.  I have provided education regarding NHL and the effectiveness of systemic chemotherapy.   PAST MEDICAL HISTORY:   Past Medical History  Diagnosis Date  . COPD (chronic obstructive pulmonary disease)     Uses occasional nighttime O2  . Disseminated herpes zoster 2010  . GERD (gastroesophageal reflux disease)   . Asthma   . Pulmonary embolus     2003 and 2011  . Pulmonary nodules     Chest CT 09/11  . Mixed hyperlipidemia   . Chronic atrial fibrillation   . Lupus anticoagulant positive   . Essential hypertension, benign   . Cholelithiasis   . Rheumatoid arthritis(714.0)   . History of chicken pox 1941; 2011  . Anemia   . Chronic lower back pain   . Chronic diastolic heart failure   . Cirrhosis     Questionable, AFP normal Feb 01/2012, hx of ETOH use   . Headache(784.0)   . Cervical spondylosis without myelopathy   . Stage III chronic kidney disease   . Cellulitis of leg, left 01/03/2015  . Aortic stenosis, moderate 05/21/2015  . Dysrhythmia     chronic AFib  . Diffuse large B cell lymphoma 08/22/2015    ALLERGIES: Allergies  Allergen Reactions  . Cymbalta [Duloxetine Hcl] Other (See  Comments)    Confusion   . Procaine Hcl Hives and Other (See Comments)    NOVOCAINE: Sweating, Confusion, Not in right state of mind.  [name]    unknown      MEDICATIONS: I have reviewed the patient's current medications.    No current facility-administered  medications on file prior to encounter.   Current Outpatient Prescriptions on File Prior to Encounter  Medication Sig Dispense Refill  . cetirizine (ZYRTEC) 10 MG tablet Take 10 mg by mouth daily.      . digoxin (LANOXIN) 0.125 MG tablet Take 1 tablet (0.125 mg total) by mouth daily. .    . diltiazem (CARDIZEM CD) 360 MG 24 hr capsule Take 1 capsule (360 mg total) by mouth daily. 30 capsule 3  . ferrous sulfate 325 (65 FE) MG tablet Take 325 mg by mouth daily.    . folic acid (FOLVITE) 1 MG tablet Take 1 mg by mouth daily.     . furosemide (LASIX) 40 MG tablet Take 0.5 tablets (20 mg total) by mouth daily. Take daily for prevention of fluid buildup.    . gabapentin (NEURONTIN) 300 MG capsule Take 2 capsules (600 mg total) by mouth 2 (two) times daily. 30 capsule 0  . ketoconazole (NIZORAL) 2 % shampoo Apply 1 application topically 3 (three) times a week.     . methotrexate (RHEUMATREX) 2.5 MG tablet Take 7.5 mg by mouth 2 (two) times a week. *TAKE 3 TABLETS BY MOUTH TWICE WEEKLY ON FRIDAYS AND SATURDAYS*    . mometasone (NASONEX) 50 MCG/ACT nasal spray Place 2 sprays into the nose daily.    . Omega-3 Fatty Acids (FISH OIL) 1000 MG CAPS Take 2 capsules by mouth 2 (two) times daily.     Marland Kitchen omeprazole (PRILOSEC) 40 MG capsule Take 40 mg by mouth daily.    Marland Kitchen oxyCODONE-acetaminophen (PERCOCET) 10-325 MG per tablet Take 1 tablet by mouth every 6 (six) hours as needed for pain. 30 tablet 0  . potassium chloride SA (K-DUR,KLOR-CON) 20 MEQ tablet Take 0.5 tablets (10 mEq total) by mouth daily. Starting 08/27/15.    . tadalafil (CIALIS) 5 MG tablet Take 5 mg by mouth daily.    . tamsulosin (FLOMAX) 0.4 MG CAPS capsule Take 1 capsule (0.4 mg total) by mouth at bedtime. For prostate treatment. 30 capsule 3  . albuterol (PROAIR HFA) 108 (90 BASE) MCG/ACT inhaler Inhale 2 puffs into the lungs every 6 (six) hours as needed. Shortness of Breath 1 Inhaler 3  . Etanercept (ENBREL) 25 MG/0.5ML SOSY Inject into the  skin 2 (two) times a week.     . polyethylene glycol-electrolytes (NULYTELY/GOLYTELY) 420 G solution Take 4,000 mLs by mouth once. 4000 mL 0  . Vitamin D, Ergocalciferol, (DRISDOL) 50000 UNITS CAPS capsule Take 1 capsule by mouth once a week.  4     PAST SURGICAL HISTORY Past Surgical History  Procedure Laterality Date  . Polypectomy  02/02/2012    RMR: Multiple colonic polyps removed, flat, tubular adenomas/ Left-sided diverticulosis  . Posterior lumbar vertebrae excision  2003; 2009; 2011  . Myringotomy      "3 times; both ears"  . Cataract extraction w/ intraocular lens  implant, bilateral    . Cholecystectomy  05/23/2012    Procedure: LAPAROSCOPIC CHOLECYSTECTOMY WITH INTRAOPERATIVE CHOLANGIOGRAM;  Surgeon: Stark Klein, MD;  Location: [facility];  Service: General;  Laterality: N/A;  . Esophagogastroduodenoscopy  02/02/12    TOI:ZTIWPYKD size hiatal hernia; otherwise  normal exam  . Colonoscopy  01/24/2013    KKX:FGHWEXH polyps/colonic diverticulosis  . Back surgery    . Portacath placement    . Lymph node biopsy    . Lymph node biopsy Right 09/11/2015    Procedure: RIGHT INGUINAL LYMPH NODE BIOPSY;  Surgeon: Aviva Signs, MD;  Location: [facility];  Service: General;  Laterality: Right;  . Portacath placement Left 09/11/2015    Procedure: INSERTION PORT-A-CATH;  Surgeon: Aviva Signs, MD;  Location: [facility];  Service: General;  Laterality: Left;    FAMILY HISTORY: Family History  Problem Relation Age of Onset  . Colon cancer Neg Hx   . Liver disease Neg Hx   . Diabetes Mother     SOCIAL HISTORY:  reports that he quit smoking about 17 years ago. His smoking use included Cigarettes. He has a 57 pack-year smoking history. He has never used smokeless tobacco. He reports that he does not drink alcohol or use illicit drugs.  PERFORMANCE STATUS: The patient's performance status is 1 - Symptomatic but completely ambulatory  PHYSICAL EXAM: Most Recent Vital Signs: Blood pressure 150/84, pulse  89, temperature 97.6 F (36.4 C), temperature source Oral, resp. rate 20, height _0  (1.727 m), weight 214 lb 9.6 oz (97.342 kg), SpO2 96 %. General appearance: alert, cooperative, appears stated age and no distress Head: Normocephalic, without obvious abnormality, atraumatic Eyes: negative findings: lids and lashes normal, conjunctivae and sclerae normal and corneas clear Extremities: extremities normal, atraumatic, no cyanosis or edema and Homans sign is negative, no sign of DVT Skin: Skin color, texture, turgor normal. No rashes or lesions Neurologic: Grossly normal  LABORATORY DATA:  Results for orders placed or performed during the hospital encounter of 09/13/15 (from the past 48 hour(s))  Body fluid cell count with differential     Status: Abnormal   Collection Time: 09/15/15  3:54 PM  Result Value Ref Range   Fluid Type-FCT FLUID     Comment: RIGHT PLEURAL CORRECTED ON 09/20 AT 1729: PREVIOUSLY REPORTED AS Pleural R    Color, Fluid YELLOW YELLOW   Appearance, Fluid HAZY (A) CLEAR   WBC, Fluid 628 0 - 1000 cu mm   Neutrophil Count, Fluid 15 0 - 25 %   Lymphs, Fluid 66 %   Monocyte-Macrophage-Serous Fluid 19 (L) 50 - 90 %   Eos, Fluid 0 %   Other Cells, Fluid MODERATE %    Comment: OTHER CELLS IDENTIFIED AS MESOTHELIAL CELLS WITH SOME REACTIVE/ATYPICAL CELL SEEN PENDING PATHOLOGIST REVIEW   Gram stain     Status: None   Collection Time: 09/15/15  3:54 PM  Result Value Ref Range   Specimen Description FLUID RIGHT PLEURAL    Special Requests NONE    Gram Stain      CYTOSPIN SMEAR WBC PRESENT, PREDOMINANTLY MONONUCLEAR NO ORGANISMS SEEN Performed at [facility]    Report Status 09/15/2015 FINAL   Culture, body fluid-bottle     Status: None (Preliminary result)   Collection Time: 09/15/15  3:54 PM  Result Value Ref Range   Specimen Description FLUID RIGHT PLEURAL    Special Requests BOTTLES DRAWN AEROBIC AND ANAEROBIC 10CC EACH    Culture NO GROWTH < 24 HOURS     Report Status PENDING   Vancomycin, trough     Status: Abnormal   Collection Time: 09/15/15  9:01 PM  Result Value Ref Range   Vancomycin Tr 24 (H) 10.0 - 20.0 ug/mL  Basic metabolic panel     Status:  Abnormal   Collection Time: 09/16/15  6:01 AM  Result Value Ref Range   Sodium 139 135 - 145 mmol/L   Potassium 3.8 3.5 - 5.1 mmol/L   Chloride 106 101 - 111 mmol/L   CO2 29 22 - 32 mmol/L   Glucose, Bld 97 65 - 99 mg/dL   BUN 17 6 - 20 mg/dL   Creatinine, Ser 1.04 0.61 - 1.24 mg/dL   Calcium 8.4 (L) 8.9 - 10.3 mg/dL   GFR calc non Af Amer >60 >60 mL/min   GFR calc Af Amer >60 >60 mL/min    Comment: (NOTE) The eGFR has been calculated using the CKD EPI equation. This calculation has not been validated in all clinical situations. eGFR's persistently <60 mL/min signify possible Chronic Kidney Disease.    Anion gap 4 (L) 5 - 15  Hepatitis B surface antigen     Status: None   Collection Time: 09/16/15  6:14 PM  Result Value Ref Range   Hepatitis B Surface Ag Negative Negative    Comment: (NOTE) Performed At: [facility] [place], [place] [id number] Lindon Romp MD [id number]   Hepatitis B core antibody, IgM     Status: None   Collection Time: 09/16/15  6:14 PM  Result Value Ref Range   Hep B C IgM Negative Negative    Comment: (NOTE) Performed At: [facility] [place], [place] [id number] Lindon Romp MD [id number]   Basic metabolic panel     Status: Abnormal   Collection Time: 09/17/15  5:33 AM  Result Value Ref Range   Sodium 136 135 - 145 mmol/L   Potassium 4.3 3.5 - 5.1 mmol/L   Chloride 102 101 - 111 mmol/L   CO2 29 22 - 32 mmol/L   Glucose, Bld 163 (H) 65 - 99 mg/dL   BUN 22 (H) 6 - 20 mg/dL   Creatinine, Ser 1.19 0.61 - 1.24 mg/dL   Calcium 8.6 (L) 8.9 - 10.3 mg/dL   GFR calc non Af Amer 55 (L) >60 mL/min   GFR calc Af Amer >60 >60 mL/min    Comment: (NOTE) The eGFR has been calculated using the CKD  EPI equation. This calculation has not been validated in all clinical situations. eGFR's persistently <60 mL/min signify possible Chronic Kidney Disease.    Anion gap 5 5 - 15  CBC     Status: Abnormal   Collection Time: 09/17/15  5:33 AM  Result Value Ref Range   WBC 4.1 4.0 - 10.5 K/uL   RBC 3.39 (L) 4.22 - 5.81 MIL/uL   Hemoglobin 11.8 (L) 13.0 - 17.0 g/dL   HCT 35.5 (L) 39.0 - 52.0 %   MCV 104.7 (H) 78.0 - 100.0 fL   MCH 34.8 (H) 26.0 - 34.0 pg   MCHC 33.2 30.0 - 36.0 g/dL   RDW 16.8 (H) 11.5 - 15.5 %   Platelets 78 (L) 150 - 400 K/uL    Comment: SPECIMEN CHECKED FOR CLOTS CONSISTENT WITH PREVIOUS RESULT       RADIOGRAPHY: Dg Chest 1 View  09/15/2015   CLINICAL DATA:  Status post thoracentesis  EXAM: CHEST  1 VIEW  COMPARISON:  09/13/15  FINDINGS: There is moderate cardiac enlargement. Left chest wall port a catheter is noted with tip in the projection of the SVC. Cardiac enlargement is noted. There has been decrease in volume of right pleural effusion. No significant pneumothorax identified. Similar appearance of diffuse pulmonary edema.  IMPRESSION: 1. No pneumothorax after right thoracentesis.   Electronically Signed   By: Kerby Moors M.D.   On: 09/15/2015 14:12   US Thoracentesis Asp Pleural Space W/img Guide  09/15/2015   CLINICAL DATA:  Recurrent right pleural effusion  EXAM: ULTRASOUND GUIDED RIGHT THORACENTESIS  COMPARISON:  None.  PROCEDURE: An ultrasound guided thoracentesis was thoroughly discussed with the patient and questions answered. The benefits, risks, alternatives and complications were also discussed. The patient understands and wishes to proceed with the procedure. Written consent was obtained.  Ultrasound was performed to localize and mark an adequate pocket of fluid in the right posterior chest. The area was then prepped and draped in the normal sterile fashion. 1% Lidocaine was used for local anesthesia. Under ultrasound guidance a 19 gauge Yueh catheter was  introduced. Thoracentesis was performed. The catheter was removed and a dressing applied.  COMPLICATIONS: None.  FINDINGS: A total of approximately 2 L of amber fluid was removed. A fluid sample wassent for laboratory analysis.  IMPRESSION: Successful ultrasound guided right thoracentesis yielding 2 L of pleural fluid.   Electronically Signed   By: Rolm Baptise M.D.   On: 09/15/2015 14:48       PATHOLOGY:    Diagnosis Lymph node, biopsy, right inguinal - DIFFUSE LARGE B CELL LYMPHOMA. - SEE ONCOLOGY TABLE. Microscopic Comment LYMPHOMA Histologic type: Non-Hodgkin's lymphoma, diffuse large cell type Grade (if applicable): High grade Flow cytometry: Monoclonal, kappa restricted B cell population expressing pan B cell antigens including CD20 ([id number]). Immunohistochemical stains: LCA, BCL-2, BCL-6, CD3, CD5, CD10, CD20, CD21, CD30, CD43, CD79a and CD138 performed on block 1A with appropriate controls. Touch preps/imprints: Not performed. Comments: The sections show diffuse effacement of the architecture by a predominant population of large atypical lymphoid cells displaying vesicular chromatin, prominent nucleoli and moderately abundant amphophilic cytoplasm. This is associated with brisk mitosis but no areas of necrosis. Definite well formed lymphoid follicles are not appreciated although there is vague nodularity in some areas. To further evaluate this process, flow cytometric analysis was performed ([id number]) and shows a monoclonal, kappa restricted B cell population. In addition, immunohistochemical stains were performed and show that the large atypical lymphoid cells are positive for LCA, CD20, CD79a and BCL-2. Patchy CD30 positivity is also seen. There is no significant staining for CD10 or CD138. BCL-6 stain is generally negative with only a few small clusters of positive cells. CD21 highlights the abundant dendritic networks throughout the lymph node. There is an admixed T cell  component in the background as primarily seen with CD3, CD43 and CD5. There is no appreciable staining for CD5 in B cell areas. The overall histologic and immunophenotypic features are consistent with diffuse large B cell lymphoma. (BNS:kh 09-16-15) Susanne Greenhouse MD Pathologist, Electronic Signature (Case signed 09/16/2015)   ASSESSMENT/PLAN:  Diffuse large B cell lymphoma, biopsy proven via excisional lymph node biopsy by Dr. Arnoldo Morale on 09/11/2015.  He is also S/P port placement on 09/11/2015.  I personally reviewed and went over pathology results with the patient.  He is provided a copy of his path report.   Now that we have biopsy proven disease, planning for chemotherapy is underway.  I have ordered Hepatitis B surface antigen and Hepatitis B core antibody in preparation for Rituxan monoclonal antibody treatment.  These results are in [facility] and are both negative.  I have ordered a MUGA to evaluate LVEF and heart function prior to beginning Adriamycin-based chemotherapy.  Additionally, now that confirmation of NHL is made, he  needs a bone marrow aspiration and biopsy to complete staging. I placed an IR CT biopsy yesterday evening.  Hopefully that can be performed today while the patient is in the hospital.  Looks like my order was cancelled this AM.  New order placed.  Yesterday evening, our nurse navigator was notified about chemotherapy plan and she will schedule him for chemotherapy teaching.  Yesterday evening, I built mini R-CHOP treatment plan with Rasburicase and Neulasta support.  Additionally, I ordered Allopurinol 300 mg daily (renal function is WNL) to help prevent tumor lysis syndrome.  Oncology history was developed yesterday as noted above.  We will plan for mini-R-CHOP chemotherapy beginning of next week.  Pleural effusion should respond quickly to systemic chemotherapy given the typical sensitivity of NHL to chemotherapy.  He will be restaged following two cycles of chemotherapy with PET  imaging.   All questions were answered. The patient knows to call the clinic with any problems, questions or concerns. We can certainly see the patient much sooner if necessary.  Patient and plan discussed with Dr. Ancil Linsey and she is in agreement with the aforementioned.   This note is electronically signed by: Doy Mince 09/17/2015 7:56 AM  As above. Nothing further to add. Patient with DLBCL, on enbrel/methotrexate for many years. Complete staging with BMBX. Will plan on initiation of chemotherapy once discharged from inpatient service. Donald Pore MD

## 2015-09-18 ENCOUNTER — Ambulatory Visit (HOSPITAL_COMMUNITY)
Admission: RE | Admit: 2015-09-18 | Discharge: 2015-09-18 | Disposition: A | Discharge status: Home / Self Care | Payer: Medicare Other | Source: Ambulatory Visit | Attending: Oncology | Admitting: Oncology

## 2015-09-18 ENCOUNTER — Encounter (HOSPITAL_COMMUNITY): Payer: Self-pay

## 2015-09-18 ENCOUNTER — Inpatient Hospital Stay (HOSPITAL_COMMUNITY)
Admission: RE | Admit: 2015-09-18 | Discharge: 2015-09-18 | Disposition: A | Discharge status: Home / Self Care | Payer: Medicare Other | Source: Ambulatory Visit | Attending: Radiology | Admitting: Radiology

## 2015-09-18 LAB — CBC WITH DIFFERENTIAL/PLATELET
BASOS ABS: 0 10*3/uL (ref 0.0–0.1)
BASOS PCT: 0 %
Eosinophils Absolute: 0 10*3/uL (ref 0.0–0.7)
Eosinophils Relative: 0 %
HEMATOCRIT: 34.6 % — AB (ref 39.0–52.0)
Hemoglobin: 11.4 g/dL — ABNORMAL LOW (ref 13.0–17.0)
LYMPHS PCT: 6 %
Lymphs Abs: 0.3 10*3/uL — ABNORMAL LOW (ref 0.7–4.0)
MCH: 34.2 pg — ABNORMAL HIGH (ref 26.0–34.0)
MCHC: 32.9 g/dL (ref 30.0–36.0)
MCV: 103.9 fL — AB (ref 78.0–100.0)
Monocytes Absolute: 0.2 10*3/uL (ref 0.1–1.0)
Monocytes Relative: 6 %
NEUTROS ABS: 3.7 10*3/uL (ref 1.7–7.7)
NEUTROS PCT: 88 %
RBC: 3.33 MIL/uL — AB (ref 4.22–5.81)
RDW: 16.7 % — AB (ref 11.5–15.5)
WBC: 4.2 10*3/uL (ref 4.0–10.5)

## 2015-09-18 LAB — BASIC METABOLIC PANEL
ANION GAP: 6 (ref 5–15)
BUN: 32 mg/dL — ABNORMAL HIGH (ref 6–20)
CALCIUM: 9 mg/dL (ref 8.9–10.3)
CO2: 28 mmol/L (ref 22–32)
Chloride: 101 mmol/L (ref 101–111)
Creatinine, Ser: 1.28 mg/dL — ABNORMAL HIGH (ref 0.61–1.24)
GFR, EST AFRICAN AMERICAN: 58 mL/min — AB (ref >60)
GFR, EST NON AFRICAN AMERICAN: 50 mL/min — AB (ref >60)
Glucose, Bld: 170 mg/dL — ABNORMAL HIGH (ref 65–99)
POTASSIUM: 4.5 mmol/L (ref 3.5–5.1)
SODIUM: 135 mmol/L (ref 135–145)

## 2015-09-18 LAB — CBC
HEMATOCRIT: 35.6 % — AB (ref 39.0–52.0)
HEMOGLOBIN: 11.8 g/dL — AB (ref 13.0–17.0)
MCH: 34.4 pg — AB (ref 26.0–34.0)
MCHC: 33.1 g/dL (ref 30.0–36.0)
MCV: 103.8 fL — ABNORMAL HIGH (ref 78.0–100.0)
Platelets: 96 10*3/uL — ABNORMAL LOW (ref 150–400)
RBC: 3.43 MIL/uL — AB (ref 4.22–5.81)
RDW: 16.6 % — ABNORMAL HIGH (ref 11.5–15.5)
WBC: 4.7 10*3/uL (ref 4.0–10.5)

## 2015-09-18 LAB — CULTURE, BLOOD (ROUTINE X 2)
Culture: NO GROWTH
Culture: NO GROWTH

## 2015-09-18 LAB — APTT: APTT: 25 s (ref 24–37)

## 2015-09-18 LAB — PROTIME-INR
INR: 1.16 (ref 0.00–1.49)
PROTHROMBIN TIME: 15 s (ref 11.6–15.2)

## 2015-09-18 LAB — BONE MARROW EXAM

## 2015-09-18 MED ORDER — MIDAZOLAM HCL 2 MG/2ML IJ SOLN
INTRAMUSCULAR | Status: AC | PRN
  Administered 2015-09-18 (×3): 1 mg via INTRAVENOUS

## 2015-09-18 MED ORDER — PREDNISONE 10 MG PO TABS: ORAL_TABLET | ORAL | Status: DC

## 2015-09-18 MED ORDER — LEVOFLOXACIN 750 MG PO TABS: 750.0000 mg | ORAL_TABLET | Freq: Every day | ORAL | Status: DC

## 2015-09-18 MED ORDER — SODIUM CHLORIDE 0.9 % IV SOLN
INTRAVENOUS | Status: DC
  Administered 2015-09-18: 10:00:00 via INTRAVENOUS

## 2015-09-18 MED ORDER — ALLOPURINOL 300 MG PO TABS: 300.0000 mg | ORAL_TABLET | Freq: Every day | ORAL | Status: DC

## 2015-09-18 MED ORDER — GUAIFENESIN ER 600 MG PO TB12: 600.0000 mg | ORAL_TABLET | Freq: Two times a day (BID) | ORAL | Status: DC

## 2015-09-18 MED ORDER — MIDAZOLAM HCL 2 MG/2ML IJ SOLN
INTRAMUSCULAR | Status: AC
  Filled 2015-09-18: qty 6

## 2015-09-18 MED ORDER — FENTANYL CITRATE (PF) 100 MCG/2ML IJ SOLN
INTRAMUSCULAR | Status: AC | PRN
  Administered 2015-09-18: 50 ug via INTRAVENOUS

## 2015-09-18 MED ORDER — FENTANYL CITRATE (PF) 100 MCG/2ML IJ SOLN
INTRAMUSCULAR | Status: AC
  Filled 2015-09-18: qty 4

## 2015-09-18 MED ORDER — IPRATROPIUM-ALBUTEROL 0.5-2.5 (3) MG/3ML IN SOLN: 3.0000 mL | Freq: Four times a day (QID) | RESPIRATORY_TRACT | Status: AC | PRN

## 2015-09-18 MED ORDER — ASPIRIN 81 MG PO TBEC: 81.0000 mg | DELAYED_RELEASE_TABLET | Freq: Every day | ORAL | Status: DC

## 2015-09-18 NOTE — Care Management Important Message (Signed)
Important Message  Patient Details  Name: [name] MRN: [id number] Date of Birth: 10-10-1931   Medicare Important Message Given:  Yes-third notification given    Sherald Barge, RN 09/18/2015, 9:18 AM

## 2015-09-18 NOTE — Care Management Note (Signed)
Case Management Note  Patient Details  Name: [name] MRN: [id number] Date of Birth: 03/03/1931  Expected Discharge Date:     08/29/2015             Expected Discharge Plan:  Home/Self Care  In-House Referral:  NA  Discharge planning Services  CM Consult  Post Acute Care Choice:  NA Choice offered to:  NA  DME Arranged:    DME Agency:     HH Arranged:    [place] Agency:     Status of Service:  Completed, signed off  Medicare Important Message Given:  Yes-third notification given Date Medicare IM Given:    Medicare IM give by:    Date Additional Medicare IM Given:    Additional Medicare Important Message give by:     If discussed at [place] of Stay Meetings, dates discussed:    Additional Comments: Pt going for BM biopsy today. Possible DC home on return from having biopsy. No CM needs identified at this time.  Sherald Barge, RN 09/18/2015, 9:18 AM

## 2015-09-18 NOTE — Procedures (Signed)
Technically successful CT guided bone marrow aspiration and biopsy of left iliac crest. No immediate complications.    SignedSandi Mariscal Pager: [phone] 09/18/2015, 11:44 AM

## 2015-09-18 NOTE — Discharge Instructions (Signed)
Conscious Sedation, Adult, Care After °Refer to this sheet in the next few weeks. These instructions provide you with information on caring for yourself after your procedure. Your health care provider may also give you more specific instructions. Your treatment has been planned according to current medical practices, but problems sometimes occur. Call your health care provider if you have any problems or questions after your procedure. °WHAT TO EXPECT AFTER THE PROCEDURE  °After your procedure: °· You may feel sleepy, clumsy, and have poor balance for several hours. °· Vomiting may occur if you eat too soon after the procedure. °HOME CARE INSTRUCTIONS °· Do not participate in any activities where you could become injured for at least 24 hours. Do not: °¨ Drive. °¨ Swim. °¨ Ride a bicycle. °¨ Operate heavy machinery. °¨ Cook. °¨ Use power tools. °¨ Climb ladders. °¨ Work from a high place. °· Do not make important decisions or sign legal documents until you are improved. °· If you vomit, drink water, juice, or soup when you can drink without vomiting. Make sure you have little or no nausea before eating solid foods. °· Only take over-the-counter or prescription medicines for pain, discomfort, or fever as directed by your health care provider. °· Make sure you and your family fully understand everything about the medicines given to you, including what side effects may occur. °· You should not drink alcohol, take sleeping pills, or take medicines that cause drowsiness for at least 24 hours. °· If you smoke, do not smoke without supervision. °· If you are feeling better, you may resume normal activities 24 hours after you were sedated. °· Keep all appointments with your health care provider. °SEEK MEDICAL CARE IF: °· Your skin is pale or bluish in color. °· You continue to feel nauseous or vomit. °· Your pain is getting worse and is not helped by medicine. °· You have bleeding or swelling. °· You are still sleepy or  feeling clumsy after 24 hours. °SEEK IMMEDIATE MEDICAL CARE IF: °· You develop a rash. °· You have difficulty breathing. °· You develop any type of allergic problem. °· You have a fever. °MAKE SURE YOU: °· Understand these instructions. °· Will watch your condition. °· Will get help right away if you are not doing well or get worse. °Document Released: 10/02/2013 Document Reviewed: 10/02/2013 °Exit[employer]® Patient Information ©2015 [employer]. This information is not intended to replace advice given to you by your health care provider. Make sure you discuss any questions you have with your health care provider. °Bone Biopsy, Needle, Care After °Read the instructions outlined below and refer to this sheet in the next few weeks. These discharge instructions provide you with general information on caring for yourself after you leave the hospital. Your caregiver may also give you specific instructions. While your treatment has been planned according to the most current medical practices available, unavoidable complications sometimes occur. If you have any problems or questions after discharge, call your caregiver. °Finding out the results of your test °Not all test results are available during your visit. If your test results are not back during the visit, make an appointment with your caregiver to find out the results. Do not assume everything is normal if you have not heard from your caregiver or the medical facility. It is important for you to follow up on all of your test results.  °SEEK MEDICAL CARE IF:  °· You have redness, swelling, or increasing pain at the site of the biopsy. °·   You have pus coming from the biopsy site. °· You have drainage from the biopsy site lasting longer than 1 day. °· You notice a bad smell coming from the biopsy site or dressing. °· You develop persistent nausea or vomiting. °SEEK IMMEDIATE MEDICAL CARE IF: °· You have a fever. °· You develop a rash. °· You have difficulty  breathing. °· You develop any reaction or side effects to medicines given. °Document Released: 07/01/2005 Document Revised: 10/02/2013 Document Reviewed: 05/19/2009 °Exit[employer]® Patient Information ©2015 [employer]. This information is not intended to replace advice given to you by your health care provider. Make sure you discuss any questions you have with your health care provider. ° °

## 2015-09-18 NOTE — Consult Note (Signed)
Chief Complaint: Patient was seen in consultation today for CT guided bone marrow biopsy    Referring Physician(s): Kefalas,Thomas S  History of Present Illness: [name] is a 81 y.o. male with history of recently diagnosed diffuse B cell NHL who presents today for CT guided bone marrow biopsy for staging.  Past Medical History  Diagnosis Date  . COPD (chronic obstructive pulmonary disease)     Uses occasional nighttime O2  . Disseminated herpes zoster 2010  . GERD (gastroesophageal reflux disease)   . Asthma   . Pulmonary embolus     2003 and 2011  . Pulmonary nodules     Chest CT 09/11  . Mixed hyperlipidemia   . Chronic atrial fibrillation   . Lupus anticoagulant positive   . Essential hypertension, benign   . Cholelithiasis   . Rheumatoid arthritis(714.0)   . History of chicken pox 1941; 2011  . Anemia   . Chronic lower back pain   . Chronic diastolic heart failure   . Cirrhosis     Questionable, AFP normal Feb 01/2012, hx of ETOH use   . Headache(784.0)   . Cervical spondylosis without myelopathy   . Stage III chronic kidney disease   . Cellulitis of leg, left 01/03/2015  . Aortic stenosis, moderate 05/21/2015  . Dysrhythmia     chronic AFib  . Diffuse large B cell lymphoma 08/22/2015    Past Surgical History  Procedure Laterality Date  . Polypectomy  02/02/2012    RMR: Multiple colonic polyps removed, flat, tubular adenomas/ Left-sided diverticulosis  . Posterior lumbar vertebrae excision  2003; 2009; 2011  . Myringotomy      "3 times; both ears"  . Cataract extraction w/ intraocular lens  implant, bilateral    . Cholecystectomy  05/23/2012    Procedure: LAPAROSCOPIC CHOLECYSTECTOMY WITH INTRAOPERATIVE CHOLANGIOGRAM;  Surgeon: Stark Klein, MD;  Location: [facility];  Service: General;  Laterality: N/A;  . Esophagogastroduodenoscopy  02/02/12    GUY:QIHKVQQV size hiatal hernia; otherwise normal exam  . Colonoscopy  01/24/2013    ZDG:LOVFIEP polyps/colonic  diverticulosis  . Back surgery    . Portacath placement    . Lymph node biopsy    . Lymph node biopsy Right 09/11/2015    Procedure: RIGHT INGUINAL LYMPH NODE BIOPSY;  Surgeon: Aviva Signs, MD;  Location: [facility];  Service: General;  Laterality: Right;  . Portacath placement Left 09/11/2015    Procedure: INSERTION PORT-A-CATH;  Surgeon: Aviva Signs, MD;  Location: [facility];  Service: General;  Laterality: Left;    Allergies: Cymbalta; Procaine hcl; and Bystolic  Medications: Prior to Admission medications   Medication Sig Start Date End Date Taking? Authorizing Provider  albuterol (PROAIR HFA) 108 (90 BASE) MCG/ACT inhaler Inhale 2 puffs into the lungs every 6 (six) hours as needed. Shortness of Breath 08/25/15   Radene Gunning, NP  allopurinol (ZYLOPRIM) 300 MG tablet Take 1 tablet (300 mg total) by mouth daily. 09/16/15   Baird Cancer, PA-C  cetirizine (ZYRTEC) 10 MG tablet Take 10 mg by mouth daily.      Historical Provider, MD  digoxin (LANOXIN) 0.125 MG tablet Take 1 tablet (0.125 mg total) by mouth daily. . 08/25/15   Radene Gunning, NP  diltiazem (CARDIZEM CD) 360 MG 24 hr capsule Take 1 capsule (360 mg total) by mouth daily. 05/25/15   Rexene Alberts, MD  Etanercept (ENBREL) 25 MG/0.5ML SOSY Inject into the skin 2 (two) times a week.  Historical Provider, MD  ferrous sulfate 325 (65 FE) MG tablet Take 325 mg by mouth daily.    Historical Provider, MD  folic acid (FOLVITE) 1 MG tablet Take 1 mg by mouth daily.     Historical Provider, MD  furosemide (LASIX) 40 MG tablet Take 0.5 tablets (20 mg total) by mouth daily. Take daily for prevention of fluid buildup. 05/25/15   Rexene Alberts, MD  gabapentin (NEURONTIN) 300 MG capsule Take 2 capsules (600 mg total) by mouth 2 (two) times daily. 08/25/15   Radene Gunning, NP  ketoconazole (NIZORAL) 2 % shampoo Apply 1 application topically 3 (three) times a week.  05/21/13   Historical Provider, MD  lidocaine-prilocaine (EMLA) cream Apply a quarter  size amount to port site 1 hour prior to chemo. Do not rub in. Cover with plastic wrap. 09/17/15   Patrici Ranks, MD  methotrexate (RHEUMATREX) 2.5 MG tablet Take 7.5 mg by mouth 2 (two) times a week. *TAKE 3 TABLETS BY MOUTH TWICE WEEKLY ON FRIDAYS AND SATURDAYS*    Historical Provider, MD  mometasone (NASONEX) 50 MCG/ACT nasal spray Place 2 sprays into the nose daily.    Historical Provider, MD  Omega-3 Fatty Acids (FISH OIL) 1000 MG CAPS Take 2 capsules by mouth 2 (two) times daily.     Historical Provider, MD  omeprazole (PRILOSEC) 40 MG capsule Take 40 mg by mouth daily.    Historical Provider, MD  ondansetron (ZOFRAN) 8 MG tablet Take 1 tablet every 8 hours as needed for nausea/vomiting. 09/16/15   Baird Cancer, PA-C  oxyCODONE-acetaminophen (PERCOCET) 10-325 MG per tablet Take 1 tablet by mouth every 6 (six) hours as needed for pain. 09/11/15   Aviva Signs, MD  polyethylene glycol-electrolytes (NULYTELY/GOLYTELY) 420 G solution Take 4,000 mLs by mouth once. 09/07/15   Carlis Stable, NP  potassium chloride SA (K-DUR,KLOR-CON) 20 MEQ tablet Take 0.5 tablets (10 mEq total) by mouth daily. Starting 08/27/15. 08/25/15   Radene Gunning, NP  prochlorperazine (COMPAZINE) 10 MG tablet Take 1 tablet (10 mg total) by mouth every 6 (six) hours as needed (Nausea or vomiting). 09/16/15   Baird Cancer, PA-C  tadalafil (CIALIS) 5 MG tablet Take 5 mg by mouth daily. 05/18/15   Historical Provider, MD  tamsulosin (FLOMAX) 0.4 MG CAPS capsule Take 1 capsule (0.4 mg total) by mouth at bedtime. For prostate treatment. 05/25/15   Rexene Alberts, MD  Vitamin D, Ergocalciferol, (DRISDOL) 50000 UNITS CAPS capsule Take 1 capsule by mouth once a week. 04/27/15   Historical Provider, MD     Family History  Problem Relation Age of Onset  . Colon cancer Neg Hx   . Liver disease Neg Hx   . Diabetes Mother     Social History   Social History  . Marital Status: Widowed    Spouse Name: N/A  . Number of Children: 6  .  Years of Education: 7th   Occupational History  . retired   . RETIRED    Social History Main Topics  . Smoking status: Former Smoker -- 1.00 packs/day for 57 years    Types: Cigarettes    Quit date: 01/27/1998  . Smokeless tobacco: Never Used  . Alcohol Use: No     Comment: "quit drinking 1976"  . Drug Use: No  . Sexual Activity: Not Currently   Other Topics Concern  . Not on file   Social History Narrative      Review of Systems  Constitutional: Negative  for fever and chills.  Respiratory: Positive for cough and shortness of breath.   Cardiovascular: Negative for chest pain.  Gastrointestinal: Negative for nausea, vomiting, abdominal pain and blood in stool.  Genitourinary: Positive for enuresis. Negative for dysuria and hematuria.  Musculoskeletal: Positive for back pain.  Neurological: Positive for headaches.    Vital Signs: BP 125/54  HR 79  R 20  O2 SATS 98% 2 LITERS  TEMP 98 Physical Exam  Constitutional: He is oriented to person, place, and time. He appears well-developed and well-nourished.  Cardiovascular:  irreg irreg; intact left chest wall PAC  Pulmonary/Chest: Effort normal.  Dim BS bases bilat  Abdominal: Soft. Bowel sounds are normal. There is no tenderness.  Musculoskeletal: Normal range of motion. He exhibits edema.  Neurological: He is alert and oriented to person, place, and time.    Mallampati Score:     Imaging: Ct Abdomen Pelvis Wo Contrast  08/25/2015   CLINICAL DATA:  81 year old male with central right lower lobe mass and mediastinal and upper retroperitoneal lymphadenopathy on recent chest CT angiogram. Status post interval right thoracentesis.  EXAM: CT ABDOMEN AND PELVIS WITHOUT CONTRAST  TECHNIQUE: Multidetector CT imaging of the abdomen and pelvis was performed following the standard protocol without IV contrast.  COMPARISON:  08/22/2015 chest CT angiogram. 07/05/2013 CT abdomen/pelvis.  FINDINGS: Images are partially motion degraded.   Lower chest: Small right pleural effusion, decreased since 08/22/2015. Re- demonstrated are right coronary artery calcifications and aortic valvular and mitral annular calcifications. Partially visualized is the 4.0 cm central right lower lobe lung mass (series 2/image 2). Partially visualized is the subcarinal lymphadenopathy. Re- demonstrated is volume loss and patchy consolidation in the basilar right lower lobe, representing atelectasis and/or postobstructive pneumonia. Re- demonstrated is subpleural reticulation with associated ground-glass opacity and microcystic changes throughout the visualized lung bases, in keeping with an underlying interstitial lung disease which is incompletely evaluated on this study. Oral contrast is present in the lower thoracic esophageal lumen, in keeping with esophageal dysmotility and/or gastroesophageal reflux.  Hepatobiliary: There are 3 small simple cysts in the left liver lobe, largest 2.0 cm, as characterized on the 04/12/2012 MRI study. There are no new liver lesions. The liver surface is diffusely irregular, in keeping with cirrhosis. Status post cholecystectomy. No intrahepatic or extrahepatic biliary ductal dilatation.  Pancreas: Normal.  Spleen: Normal size spleen (craniocaudal splenic length 11.3 cm). Punctate granulomatous calcifications throughout the spleen. No splenic mass.  Adrenals/Urinary Tract: Normal adrenals. Bilateral simple renal cysts, largest 2.3 cm in the lateral lower right kidney and 2.4 cm in the upper left kidney. No hydronephrosis. No nephrolithiasis. Normal caliber ureters. Normal urinary bladder.  Stomach/Bowel: Normal stomach. Normal caliber small bowel. The appendix is not discretely visualized. Mild diverticulosis of the descending and sigmoid colon. No large bowel wall thickening or pericolonic fat stranding.  Vascular/Lymphatic: Atherosclerotic nonaneurysmal abdominal aorta. Top-normal size 1.2 cm short axis right inguinal lymph node. Mildly  enlarged 1.1 cm right retrocrural lymph node (series 2/image 21). Multiple mildly prominent gastrohepatic ligament lymph nodes, largest 0.9 cm short axis (2/21).  Reproductive: Mild prostatomegaly, unchanged.  Other: No pneumoperitoneum, ascites or focal fluid collection.  Musculoskeletal: Status post bilateral posterior lumbar spine fusion at L4-5. Marked degenerative changes throughout the visualized thoracolumbar spine. No aggressive appearing focal osseous lesions.  IMPRESSION: 1. Mild upper retroperitoneal (gastrohepatic ligament and right retrocrural) lymphadenopathy. 2. Cirrhosis. No suspicious liver lesions on this noncontrast study. No ascites. Normal size spleen. 3. No acute abnormality in the abdomen  or pelvis. No evidence of bowel obstruction or acute bowel inflammation. 4. Partially visualized central right lower lobe lung mass and subcarinal lymphadenopathy, suspicious for primary lung neoplasm and metastatic adenopathy. 5. Small right pleural effusion, decreased. Persistent patchy consolidation and volume loss in the right lower lobe, in keeping with atelectasis and/or postobstructive pneumonia. 6. Underlying interstitial lung disease in the visualized lung bases, incompletely characterized on this study.   Electronically Signed   By: Ilona Sorrel M.D.   On: 08/25/2015 13:10   Dg Chest 1 View  09/15/2015   CLINICAL DATA:  Status post thoracentesis  EXAM: CHEST  1 VIEW  COMPARISON:  09/13/15  FINDINGS: There is moderate cardiac enlargement. Left chest wall port a catheter is noted with tip in the projection of the SVC. Cardiac enlargement is noted. There has been decrease in volume of right pleural effusion. No significant pneumothorax identified. Similar appearance of diffuse pulmonary edema.  IMPRESSION: 1. No pneumothorax after right thoracentesis.   Electronically Signed   By: Kerby Moors M.D.   On: 09/15/2015 14:12   Dg Chest 1 View  08/24/2015   CLINICAL DATA:  81 year old male with right  pleural effusions status post thoracentesis.  EXAM: CHEST  1 VIEW  COMPARISON:  Chest x-ray 07/15/2015.  FINDINGS: Lung volumes are low. There are bibasilar opacities that reflect a combination of areas of chronic scarring and probable interstitial lung disease. Previously noted large right pleural effusion has significantly decreased in size, now small to moderate. No pneumothorax. No acute consolidative airspace disease. Cephalization of the pulmonary vasculature, without frank pulmonary edema. Heart size is normal. Widening of mediastinal contours, compatible with known lymphadenopathy. Atherosclerosis in the thoracic aorta.  IMPRESSION: 1. Decreased size of right-sided pleural effusion following thoracentesis. No pneumothorax or other acute complicating features. 2. The appearance of the lungs again suggests underlying interstitial lung disease. 3. Mediastinal and bilateral hilar lymphadenopathy again noted.   Electronically Signed   By: Vinnie Langton M.D.   On: 08/24/2015 15:48   Chest 2 View  09/09/2015   CLINICAL DATA:  Shortness of breath for a long time. History of asthma, hypertension an widespread lung cancer.  EXAM: CHEST  2 VIEW  COMPARISON:  Radiographs 08/24/2015.  PET-CT 09/03/2015.  FINDINGS: The heart size and mediastinal contours are stable. The patient has known mediastinal and hilar adenopathy. Right pleural effusion and dependent right basilar pulmonary opacity are not significantly changed from recent PET-CT. The interstitial markings are diffusely coarsened, corresponding with emphysema on CT. The underlying pulmonary nodularity is not well visualized. Anterior wedging in the lower thoracic spine appears grossly stable.  IMPRESSION: Compared with PET-CT of 6 days ago, no acute findings demonstrated. Persistent right pleural effusion, right basilar opacity and emphysema noted. Known metastatic disease better demonstrated on PET-CT.   Electronically Signed   By: Richardean Sale M.D.   On:  09/09/2015 09:58   Dg Chest 2 View  08/22/2015   CLINICAL DATA:  Short of breath. Atrial fibrillation with tachycardia.  EXAM: CHEST  2 VIEW  COMPARISON:  05/22/2015  FINDINGS: Moderate hyperinflation. An upper thoracic a moderate to severe compression deformity is chronic. There also lower thoracic compression deformities which are felt to be similar.  Midline trachea. Normal heart size. Atherosclerosis in the transverse aorta. Right pleural effusion is increased. No pneumothorax. Interstitial thickening is progressive and lower lobe predominant. Mild left base atelectasis. Worsened right base airspace disease.  IMPRESSION: Mild interstitial edema, superimposed upon marked COPD/chronic bronchitis.  Increasing right  pleural effusion with adjacent atelectasis or infection. Followup PA and lateral chest X-ray is recommended in 3-4 weeks following trial of antibiotic therapy to ensure resolution and exclude underlying malignancy.  Thoracolumbar compression deformities, suboptimally evaluated.   Electronically Signed   By: Abigail Miyamoto M.D.   On: 08/22/2015 15:00   Ct Angio Chest Pe W/cm &/or Wo Cm  08/22/2015   CLINICAL DATA:  Increasing shortness of breath since last night.  EXAM: CT ANGIOGRAPHY CHEST WITH CONTRAST  TECHNIQUE: Multidetector CT imaging of the chest was performed using the standard protocol during bolus administration of intravenous contrast. Multiplanar CT image reconstructions and MIPs were obtained to evaluate the vascular anatomy.  CONTRAST:  167m OMNIPAQUE IOHEXOL 350 MG/ML SOLN  COMPARISON:  06/13/2013 and 03/29/2011 as well as chest x-ray 05/19/2015  FINDINGS: Lungs are adequately inflated and demonstrate a moderate size right pleural effusion and small left pleural effusion which are new. There is associated compressive atelectasis over the right middle lobe and right lower lobes. There is mild diffuse paraseptal emphysematous disease with patchy increased bilateral interstitial markings  and mild fibrotic change. There is a 9 mm nodule over the lingula which is not significantly changed from 03/29/2011. Increase in size of a 7 mm nodule slightly more superiorly over the lingula. No new nodules identified. Minimal narrowing of the right middle lobe and lower lobe bronchi due to adjacent adenopathy/mass.  Heart is normal size. There is calcification over the left anterior descending, lateral circumflex and right coronary arteries. There is calcification over the mitral valve annulus. There is no evidence of pulmonary embolism. There is calcified plaque over the thoracic aorta. There is worsening mediastinal and bilateral hilar adenopathy with the largest node over the subcarinal region measuring 2.7 cm by short axis. There is worsening soft tissue density in the right infrahilar region which may represent adenopathy versus central lung mass measuring 2.8 x 4.1 cm. Several small nodes are present over the neck base bilaterally. There are a couple small sub cm bilateral pericardial phrenic lymph nodes.  Images through the upper abdomen demonstrate several small hypodensities over the left lobe of the liver unchanged from 2012 and likely cysts. There is a nodular contour to the liver unchanged. Mild worsening splenomegaly. There are a few small sub cm lymph nodes in the gastrohepatic ligament. There is thickening of the right corona likely due to mild adjacent retrocrural adenopathy. Bilateral renal cysts are present.  There are degenerative changes of the spine. There is a moderate compression fracture over the upper thoracic spine unchanged as well as mild loss of height of several lower thoracic vertebral bodies unchanged.  Review of the MIP images confirms the above findings.  IMPRESSION: No evidence of pulmonary embolism.  Right infrahilar lymph node versus central lung mass measuring 2.8 x 4.1 cm. Worsening mediastinal adenopathy and bilateral hilar adenopathy as described with the largest node  over the subcarinal region measuring 2.7 cm by short axis. Small nodes over the pericardial phrenic region, neck base and upper abdomen as described. New moderate size right pleural effusion and small left pleural effusion with associated atelectasis over the right middle lobe and right lower lobe. 9 mm nodule over the lingula stable since 2012 with slight increase in size of a 7 mm nodule also over the lingula. Findings concerning for central right lung malignancy with metastatic disease. Recommend PET-CT for further evaluation.  Paraseptal emphysematous change with patchy fibrotic changes showing interval progression.  Stable spinal compression fractures described.  Nodular  contour of the liver with mild splenomegaly as findings may be due to cirrhosis. Several small stable hypodensities over the left lobe of the liver likely cysts.  Bilateral renal cysts.  Atherosclerotic coronary artery disease.   Electronically Signed   By: Marin Olp M.D.   On: 08/22/2015 17:30   Nm Cardiac Muga Rest  09/17/2015   CLINICAL DATA:  Hodgkin's lymphoma. Patient to receive cardiotoxic chemotherapy.  EXAM: NUCLEAR MEDICINE CARDIAC BLOOD POOL IMAGING (MUGA)  TECHNIQUE: Cardiac multi-gated acquisition was performed at rest following intravenous injection of Tc-79mlabeled red blood cells.  RADIOPHARMACEUTICALS:  26.0 mCi Tc-911mDP in-vitro labeled red blood cells IV  COMPARISON:  None.  FINDINGS: The left ventricular ejection fraction is equal to 65.9%. There is left ventricular normal wall motion.  IMPRESSION: 1. Left ventricular ejection fraction equals 65.9%.   Electronically Signed   By: TaKerby Moors.D.   On: 09/17/2015 13:37   Nm Pet Image Initial (pi) Skull Base To Thigh  09/03/2015   CLINICAL DATA:  Initial treatment strategy for lung carcinoma. RIGHT lobe mass on CT. Adenopathy.  EXAM: NUCLEAR MEDICINE PET SKULL BASE TO THIGH  TECHNIQUE: 10.2 mCi F-18 FDG was injected intravenously. Full-ring PET imaging was  performed from the skull base to thigh after the radiotracer. CT data was obtained and used for attenuation correction and anatomic localization.  FASTING BLOOD GLUCOSE:  Value: 87 mg/dl  COMPARISON:  CT 08/22/2015  FINDINGS: NECK  Several intensely hypermetabolic RIGHT level 2 and level 3 lymph nodes are minimally enlarged but intensely metabolic with SUV max equal 8.5.  CHEST  Intensely hypermetabolic prevascular and paratracheal, subcarinal bilateral hilar lymph nodes. Example subcarinal lymph node with SUV max equal 33.  Hypermetabolic LEFT axillary lymph nodes and RIGHT supraclavicular nodes are small but intensely metabolic.  There is hypermetabolic peripheral pleural thickening as well as discrete nodules within both lungs. Hypermetabolic nodule in the LEFT upper lobe measures 6 mm on image 81, series 4. Hypermetabolic RIGHT infrahilar mass measures 2.5 cm on image 84 intense metabolic activity.  There is a moderate RIGHT pleural effusion.  ABDOMEN/PELVIS  Hypermetabolic activity through the gastric antrum in the pyloric region (image 116 in fused data set )with SUV max equals 6.4.  There is hypermetabolic activity associated small retroperitoneal and pelvic lymph nodes. These lymph nodes are small by intensely hypermetabolic. For example LEFT external iliac lymph node on image 172 measures only 7 mm with SUV max equal 13. There are bilateral hypermetabolic inguinal lymph nodes.  SKELETON  No focal hypermetabolic activity to suggest skeletal metastasis.  IMPRESSION: 1. Widespread metastatic adenopathy including the RIGHT cervical lymph nodes, LEFT axillary lymph nodes, mediastinal lymph, hilar lymph node, retroperitoneal lymph nodes and iliac lymph nodes and inguinal lymph nodes. Inguinal lymph nodes may be most accessible for biopsy. 2. Hypermetabolic pleural thickening and discrete nodules within the lungs consistent with metastatic disease. 3. Potential hypermetabolic RIGHT infrahilar mass versus  adenopathy. 4. Hypermetabolic thickening through the gastric antrum / pyloric region. Consider further investigation if a primary cannot be determined. 5. Moderate RIGHT effusion.   Electronically Signed   By: StSuzy Bouchard.D.   On: 09/03/2015 16:34   Dg Chest Port 1 View  09/13/2015   CLINICAL DATA:  Pt states woke up during the night with increased SOB, increased home 02, cough, thick sputum, ankles swollen bilaterally  EXAM: PORTABLE CHEST - 1 VIEW  COMPARISON:  09/11/2015, 09/09/2015  FINDINGS: Large right pleural effusion with underlying opacity. Mild  cardiac enlargement. Coarsened lung markings. Known metastatic disease.  IMPRESSION: Large right pleural effusion with middle and lower lobe consolidation on the right. No change from recent prior studies.   Electronically Signed   By: Skipper Cliche M.D.   On: 09/13/2015 07:36   Dg Chest Port 1 View  09/11/2015   CLINICAL DATA:  Port-A-Cath placement  EXAM: DG C-ARM 1-60 MIN-NO REPORT; PORTABLE CHEST - 1 VIEW  COMPARISON:  PET-CT September 03, 2015  FINDINGS: Port-A-Cath tip is in the superior vena cava. No pneumothorax. There is a sizable right pleural effusion with right lower lobe and right middle lobe consolidation. Multiple small nodular lesions are noted consistent with metastatic disease, better seen on the PET-CT examination. Heart is upper normal in size with pulmonary vascular within normal limits. No adenopathy appreciable. No bone lesions appreciable.  IMPRESSION: Port-A-Cath in superior vena cava. No pneumothorax. Evidence of small pulmonary nodular lesions, better seen on recent PET-CT. Sizable right effusion with right middle and lower lobe consolidation.   Electronically Signed   By: Lowella Grip III M.D.   On: 09/11/2015 13:18   Dg C-arm 1-60 Min-no Report  09/11/2015   CLINICAL DATA: lymphadenopathy   C-ARM 1-60 MINUTES  Fluoroscopy was utilized by the requesting physician.  No radiographic  interpretation.    US  Thoracentesis Asp Pleural Space W/img Guide  09/15/2015   CLINICAL DATA:  Recurrent right pleural effusion  EXAM: ULTRASOUND GUIDED RIGHT THORACENTESIS  COMPARISON:  None.  PROCEDURE: An ultrasound guided thoracentesis was thoroughly discussed with the patient and questions answered. The benefits, risks, alternatives and complications were also discussed. The patient understands and wishes to proceed with the procedure. Written consent was obtained.  Ultrasound was performed to localize and mark an adequate pocket of fluid in the right posterior chest. The area was then prepped and draped in the normal sterile fashion. 1% Lidocaine was used for local anesthesia. Under ultrasound guidance a 19 gauge Yueh catheter was introduced. Thoracentesis was performed. The catheter was removed and a dressing applied.  COMPLICATIONS: None.  FINDINGS: A total of approximately 2 L of amber fluid was removed. A fluid sample wassent for laboratory analysis.  IMPRESSION: Successful ultrasound guided right thoracentesis yielding 2 L of pleural fluid.   Electronically Signed   By: Rolm Baptise M.D.   On: 09/15/2015 14:48   US Thoracentesis Asp Pleural Space W/img Guide  08/24/2015   CLINICAL DATA:  81 year old male with shortness of breath. Large right pleural effusion.  EXAM: ULTRASOUND GUIDED RIGHT-SIDED THORACENTESIS  COMPARISON:  Chest CT 08/22/2015.  PROCEDURE: An ultrasound guided thoracentesis was thoroughly discussed with the patient and questions answered. The benefits, risks, alternatives and complications were also discussed. The patient understands and wishes to proceed with the procedure. Written consent was obtained.  Ultrasound was performed to localize and mark an adequate pocket of fluid in the right chest. The area was then prepped and draped in the normal sterile fashion. 1% Lidocaine was used for local anesthesia. Under ultrasound guidance a 19 gauge Yueh catheter was introduced. Thoracentesis was performed. The  catheter was removed and a dressing applied.  Complications:  None.  FINDINGS: A total of approximately 1,650 mL of serosanguineous fluid was removed. A fluid sample was sent for laboratory analysis.  IMPRESSION: Successful ultrasound guided right-sided thoracentesis yielding 1,650 mL of pleural fluid.   Electronically Signed   By: Vinnie Langton M.D.   On: 08/24/2015 16:08    Labs:  CBC:  Recent Labs  09/13/15 0650 09/15/15 0510 09/17/15 0533 09/18/15 0528  WBC 6.3 5.6 4.1 4.2  HGB 12.6* 11.1* 11.8* 11.4*  HCT 37.8* 33.5* 35.5* 34.6*  PLT 63* 61* 78* SPECIMEN CHECKED FOR CLOTS    COAGS:  Recent Labs  05/25/15 0627 08/22/15 1421 08/24/15 1212 09/13/15 0650  INR 2.61* 1.54* 1.50* 1.17    BMP:  Recent Labs  09/15/15 0510 09/16/15 0601 09/17/15 0533 09/18/15 0528  NA 140 139 136 135  K 3.8 3.8 4.3 4.5  CL 105 106 102 101  CO2 _0 GLUCOSE 111* 97 163* 170*  BUN 16 17 22* 32*  CALCIUM 8.3* 8.4* 8.6* 9.0  CREATININE 1.02 1.04 1.19 1.28*  GFRNONAA >60 >60 55* 50*  GFRAA >60 >60 >60 58*    LIVER FUNCTION TESTS:  Recent Labs  05/20/15 0533 08/22/15 1421 09/04/15 1219 09/13/15 0650  BILITOT 3.8* 2.4* 1.1 2.6*  AST 21 48* 34 37  ALT 13* 33 33 20  ALKPHOS 48 55 61 58  PROT 8.2* 7.4 6.6 7.0  ALBUMIN 3.4* 3.5 3.2* 3.5    TUMOR MARKERS: No results for input(s): AFPTM, CEA, CA199, CHROMGRNA in the last 8760 hours.  Assessment and Plan: [name] is a 81 y.o. male with history of recently diagnosed diffuse B cell NHL who presents today from [facility]  for CT guided bone marrow biopsy for staging.Risks and benefits discussed with the patient including, but not limited to bleeding, infection, damage to adjacent structures or low yield requiring additional tests.All of the patient's questions were answered, patient is agreeable to proceed.Consent signed and in chart.     Thank you for this interesting consult.  I greatly enjoyed meeting [name] and look forward to participating in their care.  A copy of this report was sent to the requesting provider on this date.  Signed: D. Rowe Robert 09/18/2015, 10:10 AM   I spent a total of 20 minutes in face to face in clinical consultation, greater than 50% of which was counseling/coordinating care for CT guided bone marrow biopsy

## 2015-09-18 NOTE — Discharge Summary (Signed)
Physician Discharge Summary  [name] NAT:[id number] DOB: February 28, 1931 DOA: 09/13/2015  PCP: [name], MD  Admit date: 09/13/2015 Discharge date: 09/18/2015  Time spent: 40 minutes  Recommendations for Outpatient Follow-up:  1. Follow up with oncology next week for initiation of chemotherapy  Discharge Diagnoses:  Principal Problem:   COPD with acute exacerbation Active Problems:   Rheumatoid arthritis   Thrombocytopenia   Aortic stenosis, moderate   Acute diastolic CHF (congestive heart failure)   Diffuse large B cell lymphoma   Atrial fibrillation with RVR   HCAP (healthcare-associated pneumonia)   Recurrent right pleural effusion   Chronic diastolic CHF (congestive heart failure)   Chronic respiratory failure   Discharge Condition: improved  Diet recommendation: low salt  Filed Weights   09/16/15 0500 09/17/15 0604 09/18/15 0623  Weight: 94.6 kg (208 lb 8.9 oz) 97.342 kg (214 lb 9.6 oz) 96.2 kg (212 lb 1.3 oz)    History of present illness:  This patient presents to the hospital with complaints of shortness of breath. He has a history of oxygen dependent COPD and presented with shortness of breath, cough and bilateral lower extremity swelling. Chest x-ray the emergency room showed large right pleural effusion with possible underlying consolidation. He was admitted for further treatments.  Hospital Course:  1. COPD exacerbation. Patient was treated with intravenous steroids, nebulizer treatments and antibiotic. His shortness of breath has improved and the patient returned to baseline. He is no longer wheezing and is on 2 L with comfortable respirations. Placed on a prednisone taper and follow-up with his primary care physician.. 2. Chronic respiratory failure with hypoxia likely related to COPD. he is continued on supplemental oxygen..  3. Possible HCAP, patient was treated with antibiotic. He remained afebrile and respiratory status has improved. He will  complete a total of 7 days of antibiotics. 4. Recurrent Large right pleural effusion, likely related to lymphoma. Thoracentesis on 9/20 with removal of 2L of fluids. Per patient last thoracentesis was appr. 3 weeks prior to admission. Anticipate that his recurrent pleural effusion should improve with treatment of lymphoma. 5. Atrial fibrillation with RVR, rate controlled with Diltiazem/digoxin. Not on any anticoagulation at this time. This is being readdressed by oncology. May possibly start Xarelto as an outpatient. 6. Thrombocytopenia, acute on chronic. Platelet count is at 78. Continue to follow as an outpatient 7. Chronic diastolic heart failure, compensated and stable. LVEF 04/2015 65-70%. No lower extremity edema 8. Moderate aortic stenosis 9. Diffuse large B cell lymphoma. Confirmed on lymphnode biopsy which was done prior to admission. He also had a Port-A-Cath placed prior to admission. He will follow with oncology for initiation of chemotherapy. He's been started on allopurinol to prevent tumor lysis syndrome. He had a MUGA scan that showed normal ejection fraction. He also underwent CT-guided bone marrow biopsy for further staging purposes.  10. Rheumatoid arthritis on MTX and etanercept.  Procedures: 11. Thoracentesis 9/20 with removal of 2L fluids. 12. CT guided bone marrow biopsy 9/23  Consultations:  Oncology  Interventional radiology  Discharge Exam: Filed Vitals:   09/18/15 1527  BP: 138/84  Pulse: 73  Temp: 97.9 F (36.6 C)  Resp: 20    General: NAD Cardiovascular: S1 S2 RRR Respiratory: CTA B  Discharge Instructions   Discharge Instructions    Diet - low sodium heart healthy    Complete by:  As directed      Increase activity slowly    Complete by:  As directed  Current Discharge Medication List    START taking these medications   Details  allopurinol (ZYLOPRIM) 300 MG tablet Take 1 tablet (300 mg total) by mouth daily. Qty: 30 tablet,  Refills: 3   Associated Diagnoses: Diffuse large B cell lymphoma    aspirin EC 81 MG EC tablet Take 1 tablet (81 mg total) by mouth daily. Qty: 30 tablet, Refills: 1    guaiFENesin (MUCINEX) 600 MG 12 hr tablet Take 1 tablet (600 mg total) by mouth 2 (two) times daily. Qty: 20 tablet, Refills: 0    ipratropium-albuterol (DUONEB) 0.5-2.5 (3) MG/3ML SOLN Take 3 mLs by nebulization every 6 (six) hours as needed. Qty: 360 mL, Refills: 1    levofloxacin (LEVAQUIN) 750 MG tablet Take 1 tablet (750 mg total) by mouth daily. Qty: 2 tablet, Refills: 0    predniSONE (DELTASONE) 10 MG tablet Take 76m po daily x 2days then 383mdaily x 2 days then 2095maily x 2 days then 17m29mily x2 days then stop Qty: 20 tablet, Refills: 0      CONTINUE these medications which have NOT CHANGED   Details  cetirizine (ZYRTEC) 10 MG tablet Take 10 mg by mouth daily.      digoxin (LANOXIN) 0.125 MG tablet Take 1 tablet (0.125 mg total) by mouth daily. .   Marland Kitchendiltiazem (CARDIZEM CD) 360 MG 24 hr capsule Take 1 capsule (360 mg total) by mouth daily. Qty: 30 capsule, Refills: 3    ferrous sulfate 325 (65 FE) MG tablet Take 325 mg by mouth daily.    folic acid (FOLVITE) 1 MG tablet Take 1 mg by mouth daily.     furosemide (LASIX) 40 MG tablet Take 0.5 tablets (20 mg total) by mouth daily. Take daily for prevention of fluid buildup.    gabapentin (NEURONTIN) 300 MG capsule Take 2 capsules (600 mg total) by mouth 2 (two) times daily. Qty: 30 capsule, Refills: 0    ketoconazole (NIZORAL) 2 % shampoo Apply 1 application topically 3 (three) times a week.     methotrexate (RHEUMATREX) 2.5 MG tablet Take 7.5 mg by mouth 2 (two) times a week. *TAKE 3 TABLETS BY MOUTH TWICE WEEKLY ON FRIDAYS AND SATURDAYS*    mometasone (NASONEX) 50 MCG/ACT nasal spray Place 2 sprays into the nose daily.    Omega-3 Fatty Acids (FISH OIL) 1000 MG CAPS Take 2 capsules by mouth 2 (two) times daily.     omeprazole (PRILOSEC) 40 MG  capsule Take 40 mg by mouth daily.    oxyCODONE-acetaminophen (PERCOCET) 10-325 MG per tablet Take 1 tablet by mouth every 6 (six) hours as needed for pain. Qty: 30 tablet, Refills: 0    potassium chloride SA (K-DUR,KLOR-CON) 20 MEQ tablet Take 0.5 tablets (10 mEq total) by mouth daily. Starting 08/27/15.    tadalafil (CIALIS) 5 MG tablet Take 5 mg by mouth daily.    tamsulosin (FLOMAX) 0.4 MG CAPS capsule Take 1 capsule (0.4 mg total) by mouth at bedtime. For prostate treatment. Qty: 30 capsule, Refills: 3    albuterol (PROAIR HFA) 108 (90 BASE) MCG/ACT inhaler Inhale 2 puffs into the lungs every 6 (six) hours as needed. Shortness of Breath Qty: 1 Inhaler, Refills: 3    Etanercept (ENBREL) 25 MG/0.5ML SOSY Inject into the skin 2 (two) times a week.     lidocaine-prilocaine (EMLA) cream Apply a quarter size amount to port site 1 hour prior to chemo. Do not rub in. Cover with plastic wrap. Qty: 30 g, Refills:  3   Associated Diagnoses: Diffuse large B cell lymphoma    ondansetron (ZOFRAN) 8 MG tablet Take 1 tablet every 8 hours as needed for nausea/vomiting. Qty: 30 tablet, Refills: 3   Associated Diagnoses: Diffuse large B cell lymphoma    polyethylene glycol-electrolytes (NULYTELY/GOLYTELY) 420 G solution Take 4,000 mLs by mouth once. Qty: 4000 mL, Refills: 0    prochlorperazine (COMPAZINE) 10 MG tablet Take 1 tablet (10 mg total) by mouth every 6 (six) hours as needed (Nausea or vomiting). Qty: 30 tablet, Refills: 3   Associated Diagnoses: Diffuse large B cell lymphoma    Vitamin D, Ergocalciferol, (DRISDOL) 50000 UNITS CAPS capsule Take 1 capsule by mouth once a week. Refills: 4       Allergies  Allergen Reactions  . Cymbalta [Duloxetine Hcl] Other (See Comments)    Confusion   . Procaine Hcl Hives and Other (See Comments)    NOVOCAINE: Sweating, Confusion, Not in right state of mind.  [name]    unknown   Follow-up Information     Follow up with Molli Hazard, MD.   Specialties:  Hematology and Oncology, Oncology   Why:  call next week for appointment   Contact information:   [place] [address] [phone]       Follow up with [name], MD. Schedule an appointment as soon as possible for a visit in 2 weeks.   Specialty:  Family Medicine   Contact information:   [place] [id number] [phone]        The results of significant diagnostics from this hospitalization (including imaging, microbiology, ancillary and laboratory) are listed below for reference.    Significant Diagnostic Studies: Ct Abdomen Pelvis Wo Contrast  08/25/2015   CLINICAL DATA:  81 year old male with central right lower lobe mass and mediastinal and upper retroperitoneal lymphadenopathy on recent chest CT angiogram. Status post interval right thoracentesis.  EXAM: CT ABDOMEN AND PELVIS WITHOUT CONTRAST  TECHNIQUE: Multidetector CT imaging of the abdomen and pelvis was performed following the standard protocol without IV contrast.  COMPARISON:  08/22/2015 chest CT angiogram. 07/05/2013 CT abdomen/pelvis.  FINDINGS: Images are partially motion degraded.  Lower chest: Small right pleural effusion, decreased since 08/22/2015. Re- demonstrated are right coronary artery calcifications and aortic valvular and mitral annular calcifications. Partially visualized is the 4.0 cm central right lower lobe lung mass (series 2/image 2). Partially visualized is the subcarinal lymphadenopathy. Re- demonstrated is volume loss and patchy consolidation in the basilar right lower lobe, representing atelectasis and/or postobstructive pneumonia. Re- demonstrated is subpleural reticulation with associated ground-glass opacity and microcystic changes throughout the visualized lung bases, in keeping with an underlying interstitial lung disease which is incompletely evaluated on this study. Oral contrast is present in  the lower thoracic esophageal lumen, in keeping with esophageal dysmotility and/or gastroesophageal reflux.  Hepatobiliary: There are 3 small simple cysts in the left liver lobe, largest 2.0 cm, as characterized on the 04/12/2012 MRI study. There are no new liver lesions. The liver surface is diffusely irregular, in keeping with cirrhosis. Status post cholecystectomy. No intrahepatic or extrahepatic biliary ductal dilatation.  Pancreas: Normal.  Spleen: Normal size spleen (craniocaudal splenic length 11.3 cm). Punctate granulomatous calcifications throughout the spleen. No splenic mass.  Adrenals/Urinary Tract: Normal adrenals. Bilateral simple renal cysts, largest 2.3 cm in the lateral lower right kidney and 2.4 cm in the upper left kidney. No hydronephrosis. No nephrolithiasis. Normal caliber ureters. Normal urinary  bladder.  Stomach/Bowel: Normal stomach. Normal caliber small bowel. The appendix is not discretely visualized. Mild diverticulosis of the descending and sigmoid colon. No large bowel wall thickening or pericolonic fat stranding.  Vascular/Lymphatic: Atherosclerotic nonaneurysmal abdominal aorta. Top-normal size 1.2 cm short axis right inguinal lymph node. Mildly enlarged 1.1 cm right retrocrural lymph node (series 2/image 21). Multiple mildly prominent gastrohepatic ligament lymph nodes, largest 0.9 cm short axis (2/21).  Reproductive: Mild prostatomegaly, unchanged.  Other: No pneumoperitoneum, ascites or focal fluid collection.  Musculoskeletal: Status post bilateral posterior lumbar spine fusion at L4-5. Marked degenerative changes throughout the visualized thoracolumbar spine. No aggressive appearing focal osseous lesions.  IMPRESSION: 1. Mild upper retroperitoneal (gastrohepatic ligament and right retrocrural) lymphadenopathy. 2. Cirrhosis. No suspicious liver lesions on this noncontrast study. No ascites. Normal size spleen. 3. No acute abnormality in the abdomen or pelvis. No evidence of bowel  obstruction or acute bowel inflammation. 4. Partially visualized central right lower lobe lung mass and subcarinal lymphadenopathy, suspicious for primary lung neoplasm and metastatic adenopathy. 5. Small right pleural effusion, decreased. Persistent patchy consolidation and volume loss in the right lower lobe, in keeping with atelectasis and/or postobstructive pneumonia. 6. Underlying interstitial lung disease in the visualized lung bases, incompletely characterized on this study.   Electronically Signed   By: Ilona Sorrel M.D.   On: 08/25/2015 13:10   Dg Chest 1 View  09/15/2015   CLINICAL DATA:  Status post thoracentesis  EXAM: CHEST  1 VIEW  COMPARISON:  09/13/15  FINDINGS: There is moderate cardiac enlargement. Left chest wall port a catheter is noted with tip in the projection of the SVC. Cardiac enlargement is noted. There has been decrease in volume of right pleural effusion. No significant pneumothorax identified. Similar appearance of diffuse pulmonary edema.  IMPRESSION: 1. No pneumothorax after right thoracentesis.   Electronically Signed   By: Kerby Moors M.D.   On: 09/15/2015 14:12   Dg Chest 1 View  08/24/2015   CLINICAL DATA:  81 year old male with right pleural effusions status post thoracentesis.  EXAM: CHEST  1 VIEW  COMPARISON:  Chest x-ray 07/15/2015.  FINDINGS: Lung volumes are low. There are bibasilar opacities that reflect a combination of areas of chronic scarring and probable interstitial lung disease. Previously noted large right pleural effusion has significantly decreased in size, now small to moderate. No pneumothorax. No acute consolidative airspace disease. Cephalization of the pulmonary vasculature, without frank pulmonary edema. Heart size is normal. Widening of mediastinal contours, compatible with known lymphadenopathy. Atherosclerosis in the thoracic aorta.  IMPRESSION: 1. Decreased size of right-sided pleural effusion following thoracentesis. No pneumothorax or other  acute complicating features. 2. The appearance of the lungs again suggests underlying interstitial lung disease. 3. Mediastinal and bilateral hilar lymphadenopathy again noted.   Electronically Signed   By: Vinnie Langton M.D.   On: 08/24/2015 15:48   Chest 2 View  09/09/2015   CLINICAL DATA:  Shortness of breath for a long time. History of asthma, hypertension an widespread lung cancer.  EXAM: CHEST  2 VIEW  COMPARISON:  Radiographs 08/24/2015.  PET-CT 09/03/2015.  FINDINGS: The heart size and mediastinal contours are stable. The patient has known mediastinal and hilar adenopathy. Right pleural effusion and dependent right basilar pulmonary opacity are not significantly changed from recent PET-CT. The interstitial markings are diffusely coarsened, corresponding with emphysema on CT. The underlying pulmonary nodularity is not well visualized. Anterior wedging in the lower thoracic spine appears grossly stable.  IMPRESSION: Compared with PET-CT of  6 days ago, no acute findings demonstrated. Persistent right pleural effusion, right basilar opacity and emphysema noted. Known metastatic disease better demonstrated on PET-CT.   Electronically Signed   By: Richardean Sale M.D.   On: 09/09/2015 09:58   Dg Chest 2 View  08/22/2015   CLINICAL DATA:  Short of breath. Atrial fibrillation with tachycardia.  EXAM: CHEST  2 VIEW  COMPARISON:  05/22/2015  FINDINGS: Moderate hyperinflation. An upper thoracic a moderate to severe compression deformity is chronic. There also lower thoracic compression deformities which are felt to be similar.  Midline trachea. Normal heart size. Atherosclerosis in the transverse aorta. Right pleural effusion is increased. No pneumothorax. Interstitial thickening is progressive and lower lobe predominant. Mild left base atelectasis. Worsened right base airspace disease.  IMPRESSION: Mild interstitial edema, superimposed upon marked COPD/chronic bronchitis.  Increasing right pleural effusion  with adjacent atelectasis or infection. Followup PA and lateral chest X-ray is recommended in 3-4 weeks following trial of antibiotic therapy to ensure resolution and exclude underlying malignancy.  Thoracolumbar compression deformities, suboptimally evaluated.   Electronically Signed   By: Abigail Miyamoto M.D.   On: 08/22/2015 15:00   Ct Angio Chest Pe W/cm &/or Wo Cm  08/22/2015   CLINICAL DATA:  Increasing shortness of breath since last night.  EXAM: CT ANGIOGRAPHY CHEST WITH CONTRAST  TECHNIQUE: Multidetector CT imaging of the chest was performed using the standard protocol during bolus administration of intravenous contrast. Multiplanar CT image reconstructions and MIPs were obtained to evaluate the vascular anatomy.  CONTRAST:  164m OMNIPAQUE IOHEXOL 350 MG/ML SOLN  COMPARISON:  06/13/2013 and 03/29/2011 as well as chest x-ray 05/19/2015  FINDINGS: Lungs are adequately inflated and demonstrate a moderate size right pleural effusion and small left pleural effusion which are new. There is associated compressive atelectasis over the right middle lobe and right lower lobes. There is mild diffuse paraseptal emphysematous disease with patchy increased bilateral interstitial markings and mild fibrotic change. There is a 9 mm nodule over the lingula which is not significantly changed from 03/29/2011. Increase in size of a 7 mm nodule slightly more superiorly over the lingula. No new nodules identified. Minimal narrowing of the right middle lobe and lower lobe bronchi due to adjacent adenopathy/mass.  Heart is normal size. There is calcification over the left anterior descending, lateral circumflex and right coronary arteries. There is calcification over the mitral valve annulus. There is no evidence of pulmonary embolism. There is calcified plaque over the thoracic aorta. There is worsening mediastinal and bilateral hilar adenopathy with the largest node over the subcarinal region measuring 2.7 cm by short axis.  There is worsening soft tissue density in the right infrahilar region which may represent adenopathy versus central lung mass measuring 2.8 x 4.1 cm. Several small nodes are present over the neck base bilaterally. There are a couple small sub cm bilateral pericardial phrenic lymph nodes.  Images through the upper abdomen demonstrate several small hypodensities over the left lobe of the liver unchanged from 2012 and likely cysts. There is a nodular contour to the liver unchanged. Mild worsening splenomegaly. There are a few small sub cm lymph nodes in the gastrohepatic ligament. There is thickening of the right corona likely due to mild adjacent retrocrural adenopathy. Bilateral renal cysts are present.  There are degenerative changes of the spine. There is a moderate compression fracture over the upper thoracic spine unchanged as well as mild loss of height of several lower thoracic vertebral bodies unchanged.  Review of  the MIP images confirms the above findings.  IMPRESSION: No evidence of pulmonary embolism.  Right infrahilar lymph node versus central lung mass measuring 2.8 x 4.1 cm. Worsening mediastinal adenopathy and bilateral hilar adenopathy as described with the largest node over the subcarinal region measuring 2.7 cm by short axis. Small nodes over the pericardial phrenic region, neck base and upper abdomen as described. New moderate size right pleural effusion and small left pleural effusion with associated atelectasis over the right middle lobe and right lower lobe. 9 mm nodule over the lingula stable since 2012 with slight increase in size of a 7 mm nodule also over the lingula. Findings concerning for central right lung malignancy with metastatic disease. Recommend PET-CT for further evaluation.  Paraseptal emphysematous change with patchy fibrotic changes showing interval progression.  Stable spinal compression fractures described.  Nodular contour of the liver with mild splenomegaly as findings may  be due to cirrhosis. Several small stable hypodensities over the left lobe of the liver likely cysts.  Bilateral renal cysts.  Atherosclerotic coronary artery disease.   Electronically Signed   By: Marin Olp M.D.   On: 08/22/2015 17:30   Nm Cardiac Muga Rest  09/17/2015   CLINICAL DATA:  Hodgkin's lymphoma. Patient to receive cardiotoxic chemotherapy.  EXAM: NUCLEAR MEDICINE CARDIAC BLOOD POOL IMAGING (MUGA)  TECHNIQUE: Cardiac multi-gated acquisition was performed at rest following intravenous injection of Tc-53mlabeled red blood cells.  RADIOPHARMACEUTICALS:  26.0 mCi Tc-972mDP in-vitro labeled red blood cells IV  COMPARISON:  None.  FINDINGS: The left ventricular ejection fraction is equal to 65.9%. There is left ventricular normal wall motion.  IMPRESSION: 1. Left ventricular ejection fraction equals 65.9%.   Electronically Signed   By: TaKerby Moors.D.   On: 09/17/2015 13:37   Nm Pet Image Initial (pi) Skull Base To Thigh  09/03/2015   CLINICAL DATA:  Initial treatment strategy for lung carcinoma. RIGHT lobe mass on CT. Adenopathy.  EXAM: NUCLEAR MEDICINE PET SKULL BASE TO THIGH  TECHNIQUE: 10.2 mCi F-18 FDG was injected intravenously. Full-ring PET imaging was performed from the skull base to thigh after the radiotracer. CT data was obtained and used for attenuation correction and anatomic localization.  FASTING BLOOD GLUCOSE:  Value: 87 mg/dl  COMPARISON:  CT 08/22/2015  FINDINGS: NECK  Several intensely hypermetabolic RIGHT level 2 and level 3 lymph nodes are minimally enlarged but intensely metabolic with SUV max equal 8.5.  CHEST  Intensely hypermetabolic prevascular and paratracheal, subcarinal bilateral hilar lymph nodes. Example subcarinal lymph node with SUV max equal 33.  Hypermetabolic LEFT axillary lymph nodes and RIGHT supraclavicular nodes are small but intensely metabolic.  There is hypermetabolic peripheral pleural thickening as well as discrete nodules within both lungs.  Hypermetabolic nodule in the LEFT upper lobe measures 6 mm on image 81, series 4. Hypermetabolic RIGHT infrahilar mass measures 2.5 cm on image 84 intense metabolic activity.  There is a moderate RIGHT pleural effusion.  ABDOMEN/PELVIS  Hypermetabolic activity through the gastric antrum in the pyloric region (image 116 in fused data set )with SUV max equals 6.4.  There is hypermetabolic activity associated small retroperitoneal and pelvic lymph nodes. These lymph nodes are small by intensely hypermetabolic. For example LEFT external iliac lymph node on image 172 measures only 7 mm with SUV max equal 13. There are bilateral hypermetabolic inguinal lymph nodes.  SKELETON  No focal hypermetabolic activity to suggest skeletal metastasis.  IMPRESSION: 1. Widespread metastatic adenopathy including the RIGHT cervical lymph nodes,  LEFT axillary lymph nodes, mediastinal lymph, hilar lymph node, retroperitoneal lymph nodes and iliac lymph nodes and inguinal lymph nodes. Inguinal lymph nodes may be most accessible for biopsy. 2. Hypermetabolic pleural thickening and discrete nodules within the lungs consistent with metastatic disease. 3. Potential hypermetabolic RIGHT infrahilar mass versus adenopathy. 4. Hypermetabolic thickening through the gastric antrum / pyloric region. Consider further investigation if a primary cannot be determined. 5. Moderate RIGHT effusion.   Electronically Signed   By: Suzy Bouchard M.D.   On: 09/03/2015 16:34   Ct Biopsy  09/18/2015   INDICATION: History of non-Hodgkin's lymphoma. Please perform CT-guided biopsy for tissue diagnostic purposes.  EXAM: CT-GUIDED BONE MARROW BIOPSY AND ASPIRATION  MEDICATIONS: Fentanyl 50 mcg IV; Versed 3 mg IV  ANESTHESIA/SEDATION: Sedation Time  10 minutes  CONTRAST:  None  COMPLICATIONS: None immediate.  PROCEDURE: Informed consent was obtained from the patient following an explanation of the procedure, risks, benefits and alternatives. The patient  understands, agrees and consents for the procedure. All questions were addressed. A time out was performed prior to the initiation of the procedure. The patient was positioned prone and non-contrast localization CT was performed of the pelvis to demonstrate the iliac marrow spaces. The operative site was prepped and draped in the usual sterile fashion.  Under sterile conditions and local anesthesia, a 22 gauge spinal needle was utilized for procedural planning. Next, an 11 gauge coaxial bone biopsy needle was advanced into the left iliac marrow space. Needle position was confirmed with CT imaging. Initially, bone marrow aspiration was performed. Next, a bone marrow biopsy was obtained with the 11 gauge outer bone marrow device. Samples were prepared with the cytotechnologist and deemed adequate. The needle was removed intact. Hemostasis was obtained with compression and a dressing was placed. The patient tolerated the procedure well without immediate post procedural complication.  IMPRESSION: Successful CT guided left iliac bone marrow aspiration and core biopsies.   Electronically Signed   By: Sandi Mariscal M.D.   On: 09/18/2015 14:10   Dg Chest Port 1 View  09/13/2015   CLINICAL DATA:  Pt states woke up during the night with increased SOB, increased home 02, cough, thick sputum, ankles swollen bilaterally  EXAM: PORTABLE CHEST - 1 VIEW  COMPARISON:  09/11/2015, 09/09/2015  FINDINGS: Large right pleural effusion with underlying opacity. Mild cardiac enlargement. Coarsened lung markings. Known metastatic disease.  IMPRESSION: Large right pleural effusion with middle and lower lobe consolidation on the right. No change from recent prior studies.   Electronically Signed   By: Skipper Cliche M.D.   On: 09/13/2015 07:36   Dg Chest Port 1 View  09/11/2015   CLINICAL DATA:  Port-A-Cath placement  EXAM: DG C-ARM 1-60 MIN-NO REPORT; PORTABLE CHEST - 1 VIEW  COMPARISON:  PET-CT September 03, 2015  FINDINGS: Port-A-Cath  tip is in the superior vena cava. No pneumothorax. There is a sizable right pleural effusion with right lower lobe and right middle lobe consolidation. Multiple small nodular lesions are noted consistent with metastatic disease, better seen on the PET-CT examination. Heart is upper normal in size with pulmonary vascular within normal limits. No adenopathy appreciable. No bone lesions appreciable.  IMPRESSION: Port-A-Cath in superior vena cava. No pneumothorax. Evidence of small pulmonary nodular lesions, better seen on recent PET-CT. Sizable right effusion with right middle and lower lobe consolidation.   Electronically Signed   By: Lowella Grip III M.D.   On: 09/11/2015 13:18   Dg C-arm 1-60 Min-no Report  09/11/2015   CLINICAL DATA: lymphadenopathy   C-ARM 1-60 MINUTES  Fluoroscopy was utilized by the requesting physician.  No radiographic  interpretation.    US Thoracentesis Asp Pleural Space W/img Guide  09/15/2015   CLINICAL DATA:  Recurrent right pleural effusion  EXAM: ULTRASOUND GUIDED RIGHT THORACENTESIS  COMPARISON:  None.  PROCEDURE: An ultrasound guided thoracentesis was thoroughly discussed with the patient and questions answered. The benefits, risks, alternatives and complications were also discussed. The patient understands and wishes to proceed with the procedure. Written consent was obtained.  Ultrasound was performed to localize and mark an adequate pocket of fluid in the right posterior chest. The area was then prepped and draped in the normal sterile fashion. 1% Lidocaine was used for local anesthesia. Under ultrasound guidance a 19 gauge Yueh catheter was introduced. Thoracentesis was performed. The catheter was removed and a dressing applied.  COMPLICATIONS: None.  FINDINGS: A total of approximately 2 L of amber fluid was removed. A fluid sample wassent for laboratory analysis.  IMPRESSION: Successful ultrasound guided right thoracentesis yielding 2 L of pleural fluid.    Electronically Signed   By: Rolm Baptise M.D.   On: 09/15/2015 14:48   US Thoracentesis Asp Pleural Space W/img Guide  08/24/2015   CLINICAL DATA:  81 year old male with shortness of breath. Large right pleural effusion.  EXAM: ULTRASOUND GUIDED RIGHT-SIDED THORACENTESIS  COMPARISON:  Chest CT 08/22/2015.  PROCEDURE: An ultrasound guided thoracentesis was thoroughly discussed with the patient and questions answered. The benefits, risks, alternatives and complications were also discussed. The patient understands and wishes to proceed with the procedure. Written consent was obtained.  Ultrasound was performed to localize and mark an adequate pocket of fluid in the right chest. The area was then prepped and draped in the normal sterile fashion. 1% Lidocaine was used for local anesthesia. Under ultrasound guidance a 19 gauge Yueh catheter was introduced. Thoracentesis was performed. The catheter was removed and a dressing applied.  Complications:  None.  FINDINGS: A total of approximately 1,650 mL of serosanguineous fluid was removed. A fluid sample was sent for laboratory analysis.  IMPRESSION: Successful ultrasound guided right-sided thoracentesis yielding 1,650 mL of pleural fluid.   Electronically Signed   By: Vinnie Langton M.D.   On: 08/24/2015 16:08    Microbiology: Recent Results (from the past 240 hour(s))  Blood culture (routine x 2)     Status: None   Collection Time: 09/13/15  7:35 AM  Result Value Ref Range Status   Specimen Description BLOOD RIGHT ANTECUBITAL  Final   Special Requests BOTTLES DRAWN AEROBIC AND ANAEROBIC [place]  Final   Culture NO GROWTH 5 DAYS  Final   Report Status 09/18/2015 FINAL  Final  Blood culture (routine x 2)     Status: None   Collection Time: 09/13/15  7:43 AM  Result Value Ref Range Status   Specimen Description BLOOD LEFT ANTECUBITAL  Final   Special Requests BOTTLES DRAWN AEROBIC AND ANAEROBIC [place]  Final   Culture NO GROWTH 5 DAYS  Final   Report  Status 09/18/2015 FINAL  Final  MRSA PCR Screening     Status: None   Collection Time: 09/13/15 12:00 PM  Result Value Ref Range Status   MRSA by PCR NEGATIVE NEGATIVE Final    Comment:        The GeneXpert MRSA Assay (FDA approved for NASAL specimens only), is one component of a comprehensive MRSA colonization surveillance program. It is not intended to  diagnose MRSA infection nor to guide or monitor treatment for MRSA infections.   Gram stain     Status: None   Collection Time: 09/15/15  3:54 PM  Result Value Ref Range Status   Specimen Description FLUID RIGHT PLEURAL  Final   Special Requests NONE  Final   Gram Stain   Final    CYTOSPIN SMEAR WBC PRESENT, PREDOMINANTLY MONONUCLEAR NO ORGANISMS SEEN Performed at [facility]    Report Status 09/15/2015 FINAL  Final  Culture, body fluid-bottle     Status: None (Preliminary result)   Collection Time: 09/15/15  3:54 PM  Result Value Ref Range Status   Specimen Description FLUID RIGHT PLEURAL  Final   Special Requests BOTTLES DRAWN AEROBIC AND ANAEROBIC 10CC EACH  Final   Culture NO GROWTH 3 DAYS  Final   Report Status PENDING  Incomplete     Labs: Basic Metabolic Panel:  Recent Labs Lab 09/14/15 0531 09/15/15 0510 09/16/15 0601 09/17/15 0533 09/18/15 0528  NA 140 140 139 136 135  K 3.7 3.8 3.8 4.3 4.5  CL 103 105 106 102 101  CO2 '30 29 29 29 28  ' GLUCOSE 191* 111* 97 163* 170*  BUN '14 16 17 ' 22* 32*  CREATININE 1.14 1.02 1.04 1.19 1.28*  CALCIUM 8.4* 8.3* 8.4* 8.6* 9.0   Liver Function Tests:  Recent Labs Lab 09/13/15 0650  AST 37  ALT 20  ALKPHOS 58  BILITOT 2.6*  PROT 7.0  ALBUMIN 3.5   No results for input(s): LIPASE, AMYLASE in the last 168 hours. No results for input(s): AMMONIA in the last 168 hours. CBC:  Recent Labs Lab 09/13/15 0650 09/15/15 0510 09/17/15 0533 09/18/15 0528 09/18/15 1010  WBC 6.3 5.6 4.1 4.2 4.7  NEUTROABS 4.8  --   --  3.7  --   HGB 12.6* 11.1* 11.8* 11.4*  11.8*  HCT 37.8* 33.5* 35.5* 34.6* 35.6*  MCV 104.7* 104.7* 104.7* 103.9* 103.8*  PLT 63* 61* 78* SPECIMEN CHECKED FOR CLOTS 96*   Cardiac Enzymes:  Recent Labs Lab 09/13/15 0650  TROPONINI <0.03   BNP: BNP (last 3 results)  Recent Labs  05/20/15 0533 08/22/15 1421 09/13/15 0650  BNP 382.0* 358.0* 595.0*    ProBNP (last 3 results) No results for input(s): PROBNP in the last 8760 hours.  CBG: No results for input(s): GLUCAP in the last 168 hours.     Signed:  MEMON,JEHANZEB  [facility] 09/18/2015, 6:57 PM

## 2015-09-18 NOTE — Progress Notes (Signed)
Patient discharged home today.  Patient and patient's son was given discharge instructions, prescriptions, and care notes.  Patient and son were explained the information from the cancer center regarding starting chemo on Monday.  Patient verbalized understanding with no complaints or concerns voiced at this time.  Patient's heart monitor was removed and central tele was notified of patient's discharge.  IV was removed with catheter intact, no bleeding or complications.  Patient left unit in stable condition by a staff member in a wheelchair.

## 2015-09-20 LAB — CULTURE, BODY FLUID-BOTTLE: CULTURE: NO GROWTH

## 2015-09-20 LAB — CULTURE, BODY FLUID W GRAM STAIN -BOTTLE

## 2015-09-21 ENCOUNTER — Encounter (HOSPITAL_COMMUNITY): Payer: Self-pay | Admitting: Hematology & Oncology

## 2015-09-21 ENCOUNTER — Encounter (HOSPITAL_BASED_OUTPATIENT_CLINIC_OR_DEPARTMENT_OTHER): Payer: Medicare Other

## 2015-09-21 ENCOUNTER — Encounter (HOSPITAL_COMMUNITY): Payer: Medicare Other | Admitting: Hematology & Oncology

## 2015-09-21 VITALS — BP 117/51 | HR 89 | Temp 98.2°F | Resp 18 | Wt 205.9 lb

## 2015-09-21 DIAGNOSIS — C8330 Diffuse large B-cell lymphoma, unspecified site: Secondary | ICD-10-CM

## 2015-09-21 DIAGNOSIS — Z5111 Encounter for antineoplastic chemotherapy: Secondary | ICD-10-CM | POA: Diagnosis not present

## 2015-09-21 MED ORDER — SODIUM CHLORIDE 0.9 % IV SOLN
Freq: Once | INTRAVENOUS | Status: AC
  Administered 2015-09-21: 11:00:00 via INTRAVENOUS

## 2015-09-21 MED ORDER — SODIUM CHLORIDE 0.9 % IV SOLN
Freq: Once | INTRAVENOUS | Status: AC
  Administered 2015-09-21: 11:00:00 via INTRAVENOUS
  Filled 2015-09-21: qty 5

## 2015-09-21 MED ORDER — SODIUM CHLORIDE 0.9 % IV SOLN
400.0000 mg/m2 | Freq: Once | INTRAVENOUS | Status: AC
  Administered 2015-09-21: 860 mg via INTRAVENOUS
  Filled 2015-09-21: qty 43

## 2015-09-21 MED ORDER — ACETAMINOPHEN 325 MG PO TABS: 650.0000 mg | ORAL_TABLET | Freq: Once | ORAL | Status: DC

## 2015-09-21 MED ORDER — DIPHENHYDRAMINE HCL 25 MG PO CAPS: 50.0000 mg | ORAL_CAPSULE | Freq: Once | ORAL | Status: DC

## 2015-09-21 MED ORDER — SODIUM CHLORIDE 0.9 % IJ SOLN
10.0000 mL | INTRAMUSCULAR | Status: DC | PRN
  Administered 2015-09-21: 10 mL
  Filled 2015-09-21: qty 10

## 2015-09-21 MED ORDER — HEPARIN SOD (PORK) LOCK FLUSH 100 UNIT/ML IV SOLN
500.0000 [IU] | Freq: Once | INTRAVENOUS | Status: AC | PRN
  Administered 2015-09-21: 500 [IU]
  Filled 2015-09-21: qty 5

## 2015-09-21 MED ORDER — PALONOSETRON HCL INJECTION 0.25 MG/5ML
0.2500 mg | Freq: Once | INTRAVENOUS | Status: AC
  Administered 2015-09-21: 0.25 mg via INTRAVENOUS
  Filled 2015-09-21: qty 5

## 2015-09-21 MED ORDER — SODIUM CHLORIDE 0.9 % IV SOLN
1.0000 mg | Freq: Once | INTRAVENOUS | Status: AC
  Administered 2015-09-21: 1 mg via INTRAVENOUS
  Filled 2015-09-21: qty 1

## 2015-09-21 MED ORDER — PREDNISONE 50 MG PO TABS
90.0000 mg | ORAL_TABLET | ORAL | Status: AC
  Administered 2015-09-21: 90 mg via ORAL
  Filled 2015-09-21: qty 2

## 2015-09-21 MED ORDER — DOXORUBICIN HCL CHEMO IV INJECTION 2 MG/ML
25.0000 mg/m2 | Freq: Once | INTRAVENOUS | Status: AC
  Administered 2015-09-21: 54 mg via INTRAVENOUS
  Filled 2015-09-21: qty 27

## 2015-09-21 MED ORDER — SODIUM CHLORIDE 0.9 % IV SOLN
6.0000 mg | Freq: Once | INTRAVENOUS | Status: AC
  Administered 2015-09-21: 6 mg via INTRAVENOUS
  Filled 2015-09-21: qty 4

## 2015-09-21 NOTE — Progress Notes (Unsigned)
Chemo teaching done for Rituxan, Adriamycin, Cytoxan, Vincristine, Prednisone. Distress screening done. Calendar given to patient. Patient will need reinforcement with teaching as we proceed forward with treatment. Constipation sheet given to patient, Dr. Donald Pore advice sheet given also. What to Expect After Chemo paper given also to patient.

## 2015-09-21 NOTE — Patient Instructions (Signed)
[facility] Discharge Instructions for Patients Receiving Chemotherapy  Today you received the following chemotherapy agents adriamycin, vincristine and cytoxan Cycle 1 Day 1.  To help prevent nausea and vomiting after your treatment, we encourage you to take your nausea medication as instructed. If you develop nausea and vomiting that is not controlled by your nausea medication, call the clinic. If it is after clinic hours your family physician or the after hours number for the clinic or go to the Emergency Department. BELOW ARE SYMPTOMS THAT SHOULD BE REPORTED IMMEDIATELY:  *FEVER GREATER THAN 101.0 F  *CHILLS WITH OR WITHOUT FEVER  NAUSEA AND VOMITING THAT IS NOT CONTROLLED WITH YOUR NAUSEA MEDICATION  *UNUSUAL SHORTNESS OF BREATH  *UNUSUAL BRUISING OR BLEEDING  TENDERNESS IN MOUTH AND THROAT WITH OR WITHOUT PRESENCE OF ULCERS  *URINARY PROBLEMS  *BOWEL PROBLEMS  UNUSUAL RASH Items with * indicate a potential emergency and should be followed up as soon as possible.  Return tomorrow as scheduled Day 2.  I have been informed and understand all the instructions given to me. I know to contact the clinic, my physician, or go to the Emergency Department if any problems should occur. I do not have any questions at this time, but understand that I may call the clinic during office hours or the Patient Navigator at [phone] should I have any questions or need assistance in obtaining follow up care.    __________________________________________  _____________  __________ Signature of Patient or Authorized Representative            Date                   Time    __________________________________________ Nurse's Signature

## 2015-09-21 NOTE — Progress Notes (Signed)
Tolerated chemo well. 

## 2015-09-22 ENCOUNTER — Encounter (HOSPITAL_COMMUNITY): Payer: Self-pay | Admitting: *Deleted

## 2015-09-22 ENCOUNTER — Encounter: Payer: Self-pay | Admitting: *Deleted

## 2015-09-22 ENCOUNTER — Encounter (HOSPITAL_COMMUNITY): Payer: Self-pay | Admitting: Oncology

## 2015-09-22 ENCOUNTER — Encounter (HOSPITAL_BASED_OUTPATIENT_CLINIC_OR_DEPARTMENT_OTHER): Payer: Medicare Other | Admitting: Hematology & Oncology

## 2015-09-22 ENCOUNTER — Encounter (HOSPITAL_BASED_OUTPATIENT_CLINIC_OR_DEPARTMENT_OTHER): Payer: Medicare Other

## 2015-09-22 VITALS — BP 105/50 | HR 78 | Temp 97.9°F | Resp 20

## 2015-09-22 DIAGNOSIS — J449 Chronic obstructive pulmonary disease, unspecified: Secondary | ICD-10-CM

## 2015-09-22 DIAGNOSIS — C8330 Diffuse large B-cell lymphoma, unspecified site: Secondary | ICD-10-CM

## 2015-09-22 DIAGNOSIS — M069 Rheumatoid arthritis, unspecified: Secondary | ICD-10-CM

## 2015-09-22 DIAGNOSIS — D7589 Other specified diseases of blood and blood-forming organs: Secondary | ICD-10-CM

## 2015-09-22 DIAGNOSIS — Z5112 Encounter for antineoplastic immunotherapy: Secondary | ICD-10-CM

## 2015-09-22 DIAGNOSIS — J90 Pleural effusion, not elsewhere classified: Secondary | ICD-10-CM

## 2015-09-22 MED ORDER — HEPARIN SOD (PORK) LOCK FLUSH 100 UNIT/ML IV SOLN
500.0000 [IU] | Freq: Once | INTRAVENOUS | Status: AC | PRN
  Administered 2015-09-22: 500 [IU]

## 2015-09-22 MED ORDER — ACETAMINOPHEN 325 MG PO TABS
650.0000 mg | ORAL_TABLET | Freq: Once | ORAL | Status: AC
  Administered 2015-09-22: 650 mg via ORAL

## 2015-09-22 MED ORDER — SODIUM CHLORIDE 0.9 % IJ SOLN
10.0000 mL | INTRAMUSCULAR | Status: DC | PRN
  Administered 2015-09-22: 10 mL
  Filled 2015-09-22: qty 10

## 2015-09-22 MED ORDER — DIPHENHYDRAMINE HCL 25 MG PO CAPS
ORAL_CAPSULE | ORAL | Status: AC
  Filled 2015-09-22: qty 2

## 2015-09-22 MED ORDER — SODIUM CHLORIDE 0.9 % IV SOLN
375.0000 mg/m2 | Freq: Once | INTRAVENOUS | Status: AC
  Administered 2015-09-22: 800 mg via INTRAVENOUS
  Filled 2015-09-22: qty 80

## 2015-09-22 MED ORDER — SODIUM CHLORIDE 0.9 % IV SOLN
6.0000 mg | Freq: Once | INTRAVENOUS | Status: AC
  Administered 2015-09-22: 6 mg via INTRAVENOUS
  Filled 2015-09-22: qty 4

## 2015-09-22 MED ORDER — HEPARIN SOD (PORK) LOCK FLUSH 100 UNIT/ML IV SOLN
INTRAVENOUS | Status: AC
  Filled 2015-09-22: qty 5

## 2015-09-22 MED ORDER — ACETAMINOPHEN 325 MG PO TABS
ORAL_TABLET | ORAL | Status: AC
  Filled 2015-09-22: qty 2

## 2015-09-22 MED ORDER — SODIUM CHLORIDE 0.9 % IV SOLN
Freq: Once | INTRAVENOUS | Status: AC
  Administered 2015-09-22: 09:00:00 via INTRAVENOUS

## 2015-09-22 MED ORDER — DIPHENHYDRAMINE HCL 25 MG PO CAPS
50.0000 mg | ORAL_CAPSULE | Freq: Once | ORAL | Status: AC
  Administered 2015-09-22: 50 mg via ORAL

## 2015-09-22 MED ORDER — PEGFILGRASTIM 6 MG/0.6ML ~~LOC~~ PSKT
6.0000 mg | PREFILLED_SYRINGE | Freq: Once | SUBCUTANEOUS | Status: AC
  Administered 2015-09-22: 6 mg via SUBCUTANEOUS
  Filled 2015-09-22: qty 0.6

## 2015-09-22 NOTE — Progress Notes (Signed)
[place] Clinical Social Work  Clinical Social Work was referred by patient navigator for assessment of psychosocial needs due to new cancer diagnosis. Clinical Social Worker met patient and his daughter at [facility] to offer support and assess for needs. CSW reviewed role of CSW and common concerns for newly diagnosed patients. CSW also reviewed transportation options, resources and cancer support options. Pt reports he has good support from his daughters. Pt and daughter agree to reach out as needed and appreciated meeting with CSW today.   Clinical Social Work interventions:  Resource education  Loren Racer, [place] Tuesdays   [phone]

## 2015-09-22 NOTE — Progress Notes (Signed)
Blayton Huttner presents today for neulasta OBI placement per MD orders.  OBI device filled per protocol and placed on left upper arm.   Needle/catheter placement noted prior to patient leaving.  Tolerated without incident;  aware of injection to be delivered in 27 hours.

## 2015-09-22 NOTE — Patient Instructions (Addendum)
[place] at [facility] Discharge Instructions  RECOMMENDATIONS MADE BY THE CONSULTANT AND ANY TEST RESULTS WILL BE SENT TO YOUR REFERRING PHYSICIAN.  Today you completed Cycle 1 chemo. You are to STOP taking ENBREL. You are to CONTINUE taking XARELTO (15 mg two times daily x 1 month then 20 mg daily thereafter). You need to take this medication to treat atrial fibrillation (a-fib). Remember to take DEXAMETHASONE (steroid) Days 1 thru 5 of each chemo cycle. Remember to take ALLOPURINOL daily throughout the next several months that you are receiving chemo. Return to clinic next week for follow up with MD. (lab work as well) You may safely removed the onbody injector tomorrow after 6 pm.  Thank you for choosing [place] at [facility] to provide your oncology and hematology care.  To afford each patient quality time with our provider, please arrive at least 15 minutes before your scheduled appointment time.    You need to re-schedule your appointment should you arrive 10 or more minutes late.  We strive to give you quality time with our providers, and arriving late affects you and other patients whose appointments are after yours.  Also, if you no show three or more times for appointments you may be dismissed from the clinic at the providers discretion.     Again, thank you for choosing [facility].  Our hope is that these requests will decrease the amount of time that you wait before being seen by our physicians.       _____________________________________________________________  Should you have questions after your visit to [facility], please contact our office at [phone] between the hours of 8:30 a.m. and 4:30 p.m.  Voicemails left after 4:30 p.m. will not be returned until the following business day.  For prescription refill requests, have your pharmacy contact our office.

## 2015-09-23 ENCOUNTER — Encounter (HOSPITAL_COMMUNITY): Payer: Self-pay | Admitting: *Deleted

## 2015-09-23 ENCOUNTER — Other Ambulatory Visit: Payer: Self-pay

## 2015-09-23 NOTE — Progress Notes (Signed)
Spoke with patient. Denies any complaints post chemo. States he does feel a little "wore out" today. Reports OnBody injector still intact. Encouraged patient to call clinic as needed with any issues/concerns. Verbalized understanding.

## 2015-09-25 ENCOUNTER — Telehealth (HOSPITAL_COMMUNITY): Payer: Self-pay | Admitting: Hematology & Oncology

## 2015-09-25 ENCOUNTER — Encounter (HOSPITAL_COMMUNITY)
Admission: RE | Admit: 2015-09-25 | Discharge: 2015-09-25 | Disposition: A | Discharge status: Home / Self Care | Payer: Medicare Other | Source: Ambulatory Visit | Attending: Internal Medicine | Admitting: Internal Medicine

## 2015-09-25 NOTE — Patient Instructions (Signed)
Your procedure is scheduled on: 09/30/2015  Report to [facility] at   9:30  AM.  Call this number if you have problems the morning of surgery: [phone]   Remember:   Do not drink or eat food:After Midnight.  :  Take these medicines the morning of surgery with A SIP OF WATER: Digoxin, Diltiazem, Omeprazole, and use your inhalers Duoneb and albuterol   Do not wear jewelry, make-up or nail polish.  Do not wear lotions, powders, or perfumes. You may wear deodorant.  Do not shave 48 hours prior to surgery. Men may shave face and neck.  Do not bring valuables to the hospital.  Contacts, dentures or bridgework may not be worn into surgery.  Leave suitcase in the car. After surgery it may be brought to your room.  For patients admitted to the hospital, checkout time is 11:00 AM the day of discharge.   Patients discharged the day of surgery will not be allowed to drive home.       Colonoscopy A colonoscopy is an exam to look at the entire large intestine (colon). This exam can help find problems such as tumors, polyps, inflammation, and areas of bleeding. The exam takes about 1 hour.  LET [facility] CARE PROVIDER KNOW ABOUT:   Any allergies you have.  All medicines you are taking, including vitamins, herbs, eye drops, creams, and over-the-counter medicines.  Previous problems you or members of your family have had with the use of anesthetics.  Any blood disorders you have.  Previous surgeries you have had.  Medical conditions you have. RISKS AND COMPLICATIONS  Generally, this is a safe procedure. However, as with any procedure, complications can occur. Possible complications include:  Bleeding.  Tearing or rupture of the colon wall.  Reaction to medicines given during the exam.  Infection (rare). BEFORE THE PROCEDURE   Ask your health care provider about changing or stopping your regular medicines.  You may be prescribed an oral bowel prep. This involves drinking a  large amount of medicated liquid, starting the day before your procedure. The liquid will cause you to have multiple loose stools until your stool is almost clear or light green. This cleans out your colon in preparation for the procedure.  Do not eat or drink anything else once you have started the bowel prep, unless your health care provider tells you it is safe to do so.  Arrange for someone to drive you home after the procedure. PROCEDURE   You will be given medicine to help you relax (sedative).  You will lie on your side with your knees bent.  A long, flexible tube with a light and camera on the end (colonoscope) will be inserted through the rectum and into the colon. The camera sends video back to a computer screen as it moves through the colon. The colonoscope also releases carbon dioxide gas to inflate the colon. This helps your health care provider see the area better.  During the exam, your health care provider may take a small tissue sample (biopsy) to be examined under a microscope if any abnormalities are found.  The exam is finished when the entire colon has been viewed. AFTER THE PROCEDURE   Do not drive for 24 hours after the exam.  You may have a small amount of blood in your stool.  You may pass moderate amounts of gas and have mild abdominal cramping or bloating. This is caused by the gas used to inflate your colon during  the exam.  Ask when your test results will be ready and how you will get your results. Make sure you get your test results. Document Released: 12/09/2000 Document Revised: 10/02/2013 Document Reviewed: 08/19/2013 [facility] Patient Information 2015 [place], [place]. This information is not intended to replace advice given to you by your health care provider. Make sure you discuss any questions you have with your health care provider. Colonoscopy, Care After Refer to this sheet in the next few weeks. These instructions provide you with information on  caring for yourself after your procedure. Your health care provider may also give you more specific instructions. Your treatment has been planned according to current medical practices, but problems sometimes occur. Call your health care provider if you have any problems or questions after your procedure. WHAT TO EXPECT AFTER THE PROCEDURE  After your procedure, it is typical to have the following:  A small amount of blood in your stool.  Moderate amounts of gas and mild abdominal cramping or bloating. HOME CARE INSTRUCTIONS  Do not drive, operate machinery, or sign important documents for 24 hours.  You may shower and resume your regular physical activities, but move at a slower pace for the first 24 hours.  Take frequent rest periods for the first 24 hours.  Walk around or put a warm pack on your abdomen to help reduce abdominal cramping and bloating.  Drink enough fluids to keep your urine clear or pale yellow.  You may resume your normal diet as instructed by your health care provider. Avoid heavy or fried foods that are hard to digest.  Avoid drinking alcohol for 24 hours or as instructed by your health care provider.  Only take over-the-counter or prescription medicines as directed by your health care provider.  If a tissue sample (biopsy) was taken during your procedure:  Do not take aspirin or blood thinners for 7 days, or as instructed by your health care provider.  Do not drink alcohol for 7 days, or as instructed by your health care provider.  Eat soft foods for the first 24 hours. SEEK MEDICAL CARE IF: You have persistent spotting of blood in your stool 2-3 days after the procedure. SEEK IMMEDIATE MEDICAL CARE IF:  You have more than a small spotting of blood in your stool.  You pass large blood clots in your stool.  Your abdomen is swollen (distended).  You have nausea or vomiting.  You have a fever.  You have increasing abdominal pain that is not relieved  with medicine. Document Released: 07/26/2004 Document Revised: 10/02/2013 Document Reviewed: 08/19/2013 [facility] Patient Information 2015 [place], [place]. This information is not intended to replace advice given to you by your health care provider. Make sure you discuss any questions you have with your health care provider.

## 2015-09-25 NOTE — Telephone Encounter (Signed)
PT'S DAUGHTER [name] CALLED TO SEE IF PT SHOULD HAVE A SCHEDULED ROUTINE COLONOSCOPY? SPK WITH DR [facility] AND SHE STATED THAT THE PT SHOULD NOT HAVE IT AND IT CAN BE SCHEDULED AT A LATER DATE.  [facility] [phone]

## 2015-09-26 DIAGNOSIS — G40909 Epilepsy, unspecified, not intractable, without status epilepticus: Secondary | ICD-10-CM

## 2015-09-26 HISTORY — DX: Epilepsy, unspecified, not intractable, without status epilepticus: G40.909

## 2015-09-26 NOTE — Progress Notes (Signed)
This encounter was created in error - please disregard.

## 2015-09-28 ENCOUNTER — Encounter (HOSPITAL_COMMUNITY): Payer: Self-pay | Admitting: Hematology & Oncology

## 2015-09-28 ENCOUNTER — Other Ambulatory Visit: Payer: Self-pay | Admitting: Cardiology

## 2015-09-28 NOTE — Progress Notes (Signed)
Tony Mustache, MD [place] [address]  DLBCL, Stage IV LDH 242 (3-23 U/L) IPI of 3 High -intermediate Risk with 5 years OS 43%, CR of 55% Rheumatoid arthritis, Enbrel and MTX for "many years"  CURRENT THERAPY: DLBCL, Stage IV  INTERVAL HISTORY:  [name] 81 y.o. male comes in for chemotherapy with R Mini-CHOP for a DLBCL. He is doing fairly well. Appetite is marginal. Activity is unchanged. He was admitted last week with COPD and notes that overall his breathing is better. He still has chronic SOB.  He states is was aware of the risks of prolonged enbrel and NHL, he wonders if he really has to discontinue the drug.  He has no other complaints today, no questions or concerns.  . Past Medical History  Diagnosis Date  . COPD (chronic obstructive pulmonary disease) (HCC)     Uses occasional nighttime O2  . Disseminated herpes zoster 2010  . GERD (gastroesophageal reflux disease)   . Asthma   . Pulmonary embolus ([place])     2003 and 2011  . Pulmonary nodules     Chest CT 09/11  . Mixed hyperlipidemia   . Chronic atrial fibrillation ([place])   . Lupus anticoagulant positive   . Essential hypertension, benign   . Cholelithiasis   . Rheumatoid arthritis(714.0)   . History of chicken pox 1941; 2011  . Anemia   . Chronic lower back pain   . Chronic diastolic heart failure ([place])   . Cirrhosis ([place])     Questionable, AFP normal Feb 01/2012, hx of ETOH use   . Headache(784.0)   . Cervical spondylosis without myelopathy   . Stage III chronic kidney disease   . Cellulitis of leg, left 01/03/2015  . Aortic stenosis, moderate 05/21/2015  . Dysrhythmia     chronic AFib  . Diffuse large B cell lymphoma ([place]) 08/22/2015    has Essential hypertension, benign; PULMONARY EMBOLISM; Chronic atrial fibrillation ([place]); Rheumatoid arthritis ([place]); Lupus anticoagulant positive; GERD (gastroesophageal reflux disease); Cirrhosis ([place]); COPD (chronic  obstructive pulmonary disease) ([place]); Long term current use of anticoagulant; Arthritis; Anemia; Adenomatous polyps; Chronic diastolic heart failure ([place]); Headache(784.0); Cervical spondylosis without myelopathy; Stiffness of joints, not elsewhere classified, multiple sites; Posture imbalance; Cellulitis of leg, left; Acute kidney injury ([place]); Thrombocytopenia ([place]); Stage III chronic kidney disease; Acute on chronic diastolic CHF (congestive heart failure) ([place]); COPD exacerbation ([place]); Aortic stenosis, moderate; Acute diastolic CHF (congestive heart failure) ([place]); Acute respiratory distress ([place]); Diffuse large B cell lymphoma ([place]); Chronic a-fib (HCC); BPH (benign prostatic hyperplasia); Pleural effusion; Atrial fibrillation with RVR ([place]); HCAP (healthcare-associated pneumonia); COPD with acute exacerbation ([place]); Recurrent right pleural effusion; Chronic diastolic CHF (congestive heart failure) ([place]); Chronic respiratory failure ([place]); and NHL (non-Hodgkin's lymphoma) ([place]) on his problem list.     is allergic to cymbalta; procaine hcl; and bystolic.  Current Outpatient Prescriptions on File Prior to Visit  Medication Sig Dispense Refill  . albuterol (PROAIR HFA) 108 (90 BASE) MCG/ACT inhaler Inhale 2 puffs into the lungs every 6 (six) hours as needed. Shortness of Breath 1 Inhaler 3  . allopurinol (ZYLOPRIM) 300 MG tablet Take 1 tablet (300 mg total) by mouth daily. 30 tablet 3  . aspirin EC 81 MG EC tablet Take 1 tablet (81 mg total) by mouth daily. 30 tablet 1  . cetirizine (ZYRTEC) 10 MG tablet Take 10 mg by mouth daily.      . digoxin (LANOXIN) 0.125 MG  tablet Take 1 tablet (0.125 mg total) by mouth daily. .    . diltiazem (CARDIZEM CD) 360 MG 24 hr capsule Take 1 capsule (360 mg total) by mouth daily. 30 capsule 3  . ferrous sulfate 325 (65 FE) MG tablet Take 325 mg by mouth daily.    . fluticasone (FLONASE) 50 MCG/ACT nasal spray Place 2 sprays into both nostrils daily.   0  . folic  acid (FOLVITE) 1 MG tablet Take 1 mg by mouth daily.     . furosemide (LASIX) 40 MG tablet Take 0.5 tablets (20 mg total) by mouth daily. Take daily for prevention of fluid buildup.    . gabapentin (NEURONTIN) 300 MG capsule Take 2 capsules (600 mg total) by mouth 2 (two) times daily. 30 capsule 0  . guaiFENesin (MUCINEX) 600 MG 12 hr tablet Take 1 tablet (600 mg total) by mouth 2 (two) times daily. 20 tablet 0  . HYDROcodone-acetaminophen (NORCO) 10-325 MG per tablet Take 1 tablet by mouth every 12 (twelve) hours as needed for moderate pain.   0  . ipratropium-albuterol (DUONEB) 0.5-2.5 (3) MG/3ML SOLN Take 3 mLs by nebulization every 6 (six) hours as needed. 360 mL 1  . ketoconazole (NIZORAL) 2 % shampoo Apply 1 application topically 3 (three) times a week.     [place] Kitchen levofloxacin (LEVAQUIN) 750 MG tablet Take 1 tablet (750 mg total) by mouth daily. (Patient not taking: Reported on 09/21/2015) 2 tablet 0  . lidocaine-prilocaine (EMLA) cream Apply a quarter size amount to port site 1 hour prior to chemo. Do not rub in. Cover with plastic wrap. 30 g 3  . methotrexate (RHEUMATREX) 2.5 MG tablet Take 7.5 mg by mouth 2 (two) times a week. *TAKE 3 TABLETS BY MOUTH TWICE WEEKLY ON FRIDAYS AND SATURDAYS*    . Omega-3 Fatty Acids (FISH OIL) 1000 MG CAPS Take 2 capsules by mouth 2 (two) times daily.     [place] Kitchen omeprazole (PRILOSEC) 40 MG capsule Take 40 mg by mouth daily.    . ondansetron (ZOFRAN) 8 MG tablet Take 1 tablet every 8 hours as needed for nausea/vomiting. 30 tablet 3  . oxyCODONE-acetaminophen (PERCOCET) 10-325 MG per tablet Take 1 tablet by mouth every 6 (six) hours as needed for pain. 30 tablet 0  . potassium chloride SA (K-DUR,KLOR-CON) 20 MEQ tablet Take 0.5 tablets (10 mEq total) by mouth daily. Starting 08/27/15.    [place] Kitchen prochlorperazine (COMPAZINE) 10 MG tablet Take 1 tablet (10 mg total) by mouth every 6 (six) hours as needed (Nausea or vomiting). 30 tablet 3  . tadalafil (CIALIS) 5 MG tablet Take 5 mg  by mouth daily as needed for erectile dysfunction.     . tamsulosin (FLOMAX) 0.4 MG CAPS capsule Take 1 capsule (0.4 mg total) by mouth at bedtime. For prostate treatment. 30 capsule 3  . Vitamin D, Ergocalciferol, (DRISDOL) 50000 UNITS CAPS capsule Take 1 capsule by mouth once a week. friday  4  . XARELTO 15 MG TABS tablet Take 15 mg by mouth 2 (two) times daily with a meal. Not started taking yet  0   No current facility-administered medications on file prior to visit.    Past Surgical History  Procedure Laterality Date  . Polypectomy  02/02/2012    RMR: Multiple colonic polyps removed, flat, tubular adenomas/ Left-sided diverticulosis  . Posterior lumbar vertebrae excision  2003; 2009; 2011  . Myringotomy      "3 times; both ears"  . Cataract extraction w/ intraocular lens  implant, bilateral    . Cholecystectomy  05/23/2012    Procedure: LAPAROSCOPIC CHOLECYSTECTOMY WITH INTRAOPERATIVE CHOLANGIOGRAM;  Surgeon: Stark Klein, MD;  Location: [facility];  Service: General;  Laterality: N/A;  . Esophagogastroduodenoscopy  02/02/12    XWR:[id number] size hiatal hernia; otherwise normal exam  . Colonoscopy  01/24/2013    JXB:JYNWGNF polyps/colonic diverticulosis  . Back surgery    . Portacath placement    . Lymph node biopsy    . Lymph node biopsy Right 09/11/2015    Procedure: RIGHT INGUINAL LYMPH NODE BIOPSY;  Surgeon: Aviva Signs, MD;  Location: [facility];  Service: General;  Laterality: Right;  . Portacath placement Left 09/11/2015    Procedure: INSERTION PORT-A-CATH;  Surgeon: Aviva Signs, MD;  Location: [facility];  Service: General;  Laterality: Left;    Denies any headaches, dizziness, double vision, fevers, chills, night sweats, nausea, vomiting, diarrhea, constipation, chest pain, heart palpitations, shortness of breath, blood in stool, black tarry stool, urinary pain, urinary burning, urinary frequency, hematuria. 14 point review of systems was performed and is negative except as detailed under  history of present illness and above   PHYSICAL EXAMINATION  ECOG PERFORMANCE STATUS: 1 - Symptomatic but completely ambulatory  There were no vitals filed for this visit.  GENERAL:alert, no distress, well nourished, well developed, comfortable, cooperative, smiling and accompanied by daughter. Wearing O2. SKIN: skin color, texture, turgor are normal, no rashes or significant lesions HEAD: Normocephalic, No masses, lesions, tenderness or abnormalities EYES: normal, PERRLA, EOMI, Conjunctiva are pink and non-injected EARS: External ears normal OROPHARYNX:lips, buccal mucosa, and tongue normal and mucous membranes are moist  NECK: supple, thyroid normal size, non-tender, without nodularity, no stridor, non-tender, trachea midline LYMPH:  Right lower anterior neck thickening at site of adenopathy, axillary adenopathy on right not palpable. BREAST:not examined LUNGS: right lower lobe decreased breath sounds, otherwise clear. HEART: irregularly irregular rate & rhythm, no murmurs and no gallops ABDOMEN:abdomen soft and normal bowel sounds BACK: Back symmetric, no curvature. EXTREMITIES:less then 2 second capillary refill, no joint deformities, effusion, or inflammation, no skin discoloration, no cyanosis  NEURO: alert & oriented x 3 with fluent speech, no focal motor/sensory deficits, gait normal   LABORATORY DATA: CBC    Component Value Date/Time   WBC 4.7 09/18/2015 1010   RBC 3.43* 09/18/2015 1010   HGB 11.8* 09/18/2015 1010   HCT 35.6* 09/18/2015 1010   HCT 34 01/11/2012 1007   PLT 96* 09/18/2015 1010   MCV 103.8* 09/18/2015 1010   MCV 92.0 01/11/2012 1007   MCH 34.4* 09/18/2015 1010   MCHC 33.1 09/18/2015 1010   RDW 16.6* 09/18/2015 1010   LYMPHSABS 0.3* 09/18/2015 0528   MONOABS 0.2 09/18/2015 0528   EOSABS 0.0 09/18/2015 0528   BASOSABS 0.0 09/18/2015 0528      Chemistry      Component Value Date/Time   NA 135 09/18/2015 0528   NA 140 01/11/2012 1005   K 4.5  09/18/2015 0528   K 4.5 01/11/2012 1005   CL 101 09/18/2015 0528   CO2 28 09/18/2015 0528   BUN 32* 09/18/2015 0528   BUN 12 01/11/2012 1005   CREATININE 1.28* 09/18/2015 0528   CREATININE 1.17 01/11/2012 1005      Component Value Date/Time   CALCIUM 9.0 09/18/2015 0528   CALCIUM 9.2 01/11/2012 1005   ALKPHOS 58 09/13/2015 0650   AST 37 09/13/2015 0650   ALT 20 09/13/2015 0650   BILITOT 2.6* 09/13/2015 6213  RADIOGRAPHIC STUDIES:  Dg Chest 1 View  09/15/2015   CLINICAL DATA:  Status post thoracentesis  EXAM: CHEST  1 VIEW  COMPARISON:  09/13/15  FINDINGS: There is moderate cardiac enlargement. Left chest wall port a catheter is noted with tip in the projection of the SVC. Cardiac enlargement is noted. There has been decrease in volume of right pleural effusion. No significant pneumothorax identified. Similar appearance of diffuse pulmonary edema.  IMPRESSION: 1. No pneumothorax after right thoracentesis.   Electronically Signed   By: Kerby Moors M.D.   On: 09/15/2015 14:12   Chest 2 View  09/09/2015   CLINICAL DATA:  Shortness of breath for a long time. History of asthma, hypertension an widespread lung cancer.  EXAM: CHEST  2 VIEW  COMPARISON:  Radiographs 08/24/2015.  PET-CT 09/03/2015.  FINDINGS: The heart size and mediastinal contours are stable. The patient has known mediastinal and hilar adenopathy. Right pleural effusion and dependent right basilar pulmonary opacity are not significantly changed from recent PET-CT. The interstitial markings are diffusely coarsened, corresponding with emphysema on CT. The underlying pulmonary nodularity is not well visualized. Anterior wedging in the lower thoracic spine appears grossly stable.  IMPRESSION: Compared with PET-CT of 6 days ago, no acute findings demonstrated. Persistent right pleural effusion, right basilar opacity and emphysema noted. Known metastatic disease better demonstrated on PET-CT.   Electronically Signed   By: Richardean Sale M.D.   On: 09/09/2015 09:58   Nm Cardiac [place] Rest  09/17/2015   CLINICAL DATA:  Hodgkin's lymphoma. Patient to receive cardiotoxic chemotherapy.  EXAM: NUCLEAR MEDICINE CARDIAC BLOOD POOL IMAGING (MUGA)  TECHNIQUE: Cardiac multi-gated acquisition was performed at rest following intravenous injection of Tc-63mlabeled red blood cells.  RADIOPHARMACEUTICALS:  26.0 mCi Tc-98mDP in-vitro labeled red blood cells IV  COMPARISON:  None.  FINDINGS: The left ventricular ejection fraction is equal to 65.9%. There is left ventricular normal wall motion.  IMPRESSION: 1. Left ventricular ejection fraction equals 65.9%.   Electronically Signed   By: TaKerby Moors.D.   On: 09/17/2015 13:37   Nm Pet Image Initial (pi) Skull Base To Thigh  09/03/2015   CLINICAL DATA:  Initial treatment strategy for lung carcinoma. RIGHT lobe mass on CT. Adenopathy.  EXAM: NUCLEAR MEDICINE PET SKULL BASE TO THIGH  TECHNIQUE: 10.2 mCi F-18 FDG was injected intravenously. Full-ring PET imaging was performed from the skull base to thigh after the radiotracer. CT data was obtained and used for attenuation correction and anatomic localization.  FASTING BLOOD GLUCOSE:  Value: 87 mg/dl  COMPARISON:  CT 08/22/2015  FINDINGS: NECK  Several intensely hypermetabolic RIGHT level 2 and level 3 lymph nodes are minimally enlarged but intensely metabolic with SUV max equal 8.5.  CHEST  Intensely hypermetabolic prevascular and paratracheal, subcarinal bilateral hilar lymph nodes. Example subcarinal lymph node with SUV max equal 33.  Hypermetabolic LEFT axillary lymph nodes and RIGHT supraclavicular nodes are small but intensely metabolic.  There is hypermetabolic peripheral pleural thickening as well as discrete nodules within both lungs. Hypermetabolic nodule in the LEFT upper lobe measures 6 mm on image 81, series 4. Hypermetabolic RIGHT infrahilar mass measures 2.5 cm on image 84 intense metabolic activity.  There is a moderate RIGHT pleural  effusion.  ABDOMEN/PELVIS  Hypermetabolic activity through the gastric antrum in the pyloric region (image 116 in fused data set )with SUV max equals 6.4.  There is hypermetabolic activity associated small retroperitoneal and pelvic lymph nodes. These lymph nodes are small by  intensely hypermetabolic. For example LEFT external iliac lymph node on image 172 measures only 7 mm with SUV max equal 13. There are bilateral hypermetabolic inguinal lymph nodes.  SKELETON  No focal hypermetabolic activity to suggest skeletal metastasis.  IMPRESSION: 1. Widespread metastatic adenopathy including the RIGHT cervical lymph nodes, LEFT axillary lymph nodes, mediastinal lymph, hilar lymph node, retroperitoneal lymph nodes and iliac lymph nodes and inguinal lymph nodes. Inguinal lymph nodes may be most accessible for biopsy. 2. Hypermetabolic pleural thickening and discrete nodules within the lungs consistent with metastatic disease. 3. Potential hypermetabolic RIGHT infrahilar mass versus adenopathy. 4. Hypermetabolic thickening through the gastric antrum / pyloric region. Consider further investigation if a primary cannot be determined. 5. Moderate RIGHT effusion.   Electronically Signed   By: Suzy Bouchard M.D.   On: 09/03/2015 16:34   Ct Biopsy  09/18/2015   INDICATION: History of non-Hodgkin's lymphoma. Please perform CT-guided biopsy for tissue diagnostic purposes.  EXAM: CT-GUIDED BONE MARROW BIOPSY AND ASPIRATION  MEDICATIONS: Fentanyl 50 mcg IV; Versed 3 mg IV  ANESTHESIA/SEDATION: Sedation Time  10 minutes  CONTRAST:  None  COMPLICATIONS: None immediate.  PROCEDURE: Informed consent was obtained from the patient following an explanation of the procedure, risks, benefits and alternatives. The patient understands, agrees and consents for the procedure. All questions were addressed. A time out was performed prior to the initiation of the procedure. The patient was positioned prone and non-contrast localization CT was  performed of the pelvis to demonstrate the iliac marrow spaces. The operative site was prepped and draped in the usual sterile fashion.  Under sterile conditions and local anesthesia, a 22 gauge spinal needle was utilized for procedural planning. Next, an 11 gauge coaxial bone biopsy needle was advanced into the left iliac marrow space. Needle position was confirmed with CT imaging. Initially, bone marrow aspiration was performed. Next, a bone marrow biopsy was obtained with the 11 gauge outer bone marrow device. Samples were prepared with the cytotechnologist and deemed adequate. The needle was removed intact. Hemostasis was obtained with compression and a dressing was placed. The patient tolerated the procedure well without immediate post procedural complication.  IMPRESSION: Successful CT guided left iliac bone marrow aspiration and core biopsies.   Electronically Signed   By: Sandi Mariscal M.D.   On: 09/18/2015 14:10   Dg Chest Port 1 View  09/13/2015   CLINICAL DATA:  Pt states woke up during the night with increased SOB, increased home 02, cough, thick sputum, ankles swollen bilaterally  EXAM: PORTABLE CHEST - 1 VIEW  COMPARISON:  09/11/2015, 09/09/2015  FINDINGS: Large right pleural effusion with underlying opacity. Mild cardiac enlargement. Coarsened lung markings. Known metastatic disease.  IMPRESSION: Large right pleural effusion with middle and lower lobe consolidation on the right. No change from recent prior studies.   Electronically Signed   By: Skipper Cliche M.D.   On: 09/13/2015 07:36   Dg Chest Port 1 View  09/11/2015   CLINICAL DATA:  Port-A-Cath placement  EXAM: DG C-ARM 1-60 MIN-NO REPORT; PORTABLE CHEST - 1 VIEW  COMPARISON:  PET-CT September 03, 2015  FINDINGS: Port-A-Cath tip is in the superior vena cava. No pneumothorax. There is a sizable right pleural effusion with right lower lobe and right middle lobe consolidation. Multiple small nodular lesions are noted consistent with metastatic  disease, better seen on the PET-CT examination. Heart is upper normal in size with pulmonary vascular within normal limits. No adenopathy appreciable. No bone lesions appreciable.  IMPRESSION: Port-A-Cath in  superior vena cava. No pneumothorax. Evidence of small pulmonary nodular lesions, better seen on recent PET-CT. Sizable right effusion with right middle and lower lobe consolidation.   Electronically Signed   By: Lowella Grip III M.D.   On: 09/11/2015 13:18   Dg C-arm 1-60 Min-no Report  09/11/2015   CLINICAL DATA: lymphadenopathy   C-ARM 1-60 MINUTES  Fluoroscopy was utilized by the requesting physician.  No radiographic  interpretation.    US Thoracentesis Asp Pleural Space W/img Guide  09/15/2015   CLINICAL DATA:  Recurrent right pleural effusion  EXAM: ULTRASOUND GUIDED RIGHT THORACENTESIS  COMPARISON:  None.  PROCEDURE: An ultrasound guided thoracentesis was thoroughly discussed with the patient and questions answered. The benefits, risks, alternatives and complications were also discussed. The patient understands and wishes to proceed with the procedure. Written consent was obtained.  Ultrasound was performed to localize and mark an adequate pocket of fluid in the right posterior chest. The area was then prepped and draped in the normal sterile fashion. 1% Lidocaine was used for local anesthesia. Under ultrasound guidance a 19 gauge Yueh catheter was introduced. Thoracentesis was performed. The catheter was removed and a dressing applied.  COMPLICATIONS: None.  FINDINGS: A total of approximately 2 L of amber fluid was removed. A fluid sample wassent for laboratory analysis.  IMPRESSION: Successful ultrasound guided right thoracentesis yielding 2 L of pleural fluid.   Electronically Signed   By: Rolm Baptise M.D.   On: 09/15/2015 14:48     PATHOLOGY:  Diagnosis PLEURAL FLUID, RIGHT(SPECIMEN 1 OF 1 COLLECTED 08/24/15): ATYPICAL LYMPHOCYTES SUSPICIOUS FOR A LYMPHOPROLIFERATIVE PROCESS, SEE  COMMENT. [name] Pathologist, Electronic Signature (Case signed 08/28/2015)    ASSESSMENT AND PLAN:  DLBCL, Stage IV LDH 242 (3-23 U/L) IPI of 3 High -intermediate Risk with 5 years OS 43%, CR of 55% Rheumatoid arthritis, Enbrel and MTX for "many years" PET/CT with widespread metastatic adenopathy including the right cervical lymph nodes, left axillary lymph nodes, mediastinal lymph nodes, hilar, retroperitoneal lymph nodes and iliac lymph nodes. Inguinal lymph nodes may be most accessible for biopsy. Hypermetabolic pleural thickening in discrete nodules within the lungs consistent with metastatic disease, right infrahilar mass versus adenopathy, hypermetabolic thickening through the gastric antrum/pyloric region, moderate right effusion Macrocytosis  Lupus Anticoagulant  I discussed with the patient and his daughter diffuse large B-cell lymphoma. We will readdress staging and treatment. He will have to be closely monitored given his age. He however in spite of his COPD and NHL is still quite functional.  I advised him he has to discontinue Enbrel. We will have to reevaluate his methotrexate uses well.  Final results of his bone marrow biopsy are currently pending. We will address this at follow-up next week.  He has been referred to Dr. Gala Romney and I do feel an EGD is warranted, we will of course defer this to Dr. Gala Romney.  In regards to his macrocytosis check B12 and folate.  All questions were answered. The patient knows to call the clinic with any problems, questions or concerns. We can certainly see the patient much sooner if necessary.  This note is electronically signed [place]:[id number] Cyril Mourning, MD  09/28/2015 8:41 AM

## 2015-09-29 ENCOUNTER — Encounter (HOSPITAL_BASED_OUTPATIENT_CLINIC_OR_DEPARTMENT_OTHER): Payer: Medicare Other

## 2015-09-29 ENCOUNTER — Ambulatory Visit (HOSPITAL_COMMUNITY)
Admission: RE | Admit: 2015-09-29 | Discharge: 2015-09-29 | Disposition: A | Discharge status: Home / Self Care | Payer: Medicare Other | Source: Ambulatory Visit | Attending: Oncology | Admitting: Oncology

## 2015-09-29 ENCOUNTER — Telehealth (HOSPITAL_COMMUNITY): Payer: Self-pay | Admitting: *Deleted

## 2015-09-29 ENCOUNTER — Inpatient Hospital Stay (HOSPITAL_COMMUNITY)
Admission: AD | Admit: 2015-09-29 | Discharge: 2015-10-02 | DRG: 811 | Disposition: A | Discharge status: Home / Self Care | Payer: Medicare Other | Source: Ambulatory Visit | Attending: Internal Medicine | Admitting: Internal Medicine

## 2015-09-29 ENCOUNTER — Encounter (HOSPITAL_COMMUNITY): Payer: Self-pay | Admitting: Oncology

## 2015-09-29 ENCOUNTER — Inpatient Hospital Stay (HOSPITAL_BASED_OUTPATIENT_CLINIC_OR_DEPARTMENT_OTHER): Payer: Medicare Other

## 2015-09-29 ENCOUNTER — Encounter (HOSPITAL_COMMUNITY): Payer: Self-pay | Admitting: *Deleted

## 2015-09-29 ENCOUNTER — Encounter (HOSPITAL_COMMUNITY): Payer: Medicare Other | Admitting: Oncology

## 2015-09-29 ENCOUNTER — Inpatient Hospital Stay (HOSPITAL_COMMUNITY): Payer: Medicare Other

## 2015-09-29 VITALS — BP 87/51 | HR 68 | Temp 98.1°F | Resp 22 | Wt 201.1 lb

## 2015-09-29 DIAGNOSIS — C8338 Diffuse large B-cell lymphoma, lymph nodes of multiple sites: Secondary | ICD-10-CM

## 2015-09-29 DIAGNOSIS — Z87891 Personal history of nicotine dependence: Secondary | ICD-10-CM | POA: Diagnosis not present

## 2015-09-29 DIAGNOSIS — E860 Dehydration: Secondary | ICD-10-CM | POA: Diagnosis not present

## 2015-09-29 DIAGNOSIS — J90 Pleural effusion, not elsewhere classified: Secondary | ICD-10-CM | POA: Diagnosis present

## 2015-09-29 DIAGNOSIS — N183 Chronic kidney disease, stage 3 (moderate): Secondary | ICD-10-CM | POA: Diagnosis present

## 2015-09-29 DIAGNOSIS — R0689 Other abnormalities of breathing: Secondary | ICD-10-CM

## 2015-09-29 DIAGNOSIS — C8330 Diffuse large B-cell lymphoma, unspecified site: Secondary | ICD-10-CM

## 2015-09-29 DIAGNOSIS — R609 Edema, unspecified: Secondary | ICD-10-CM

## 2015-09-29 DIAGNOSIS — Z79891 Long term (current) use of opiate analgesic: Secondary | ICD-10-CM

## 2015-09-29 DIAGNOSIS — I482 Chronic atrial fibrillation, unspecified: Secondary | ICD-10-CM | POA: Diagnosis present

## 2015-09-29 DIAGNOSIS — M545 Low back pain: Secondary | ICD-10-CM | POA: Diagnosis present

## 2015-09-29 DIAGNOSIS — D6959 Other secondary thrombocytopenia: Secondary | ICD-10-CM | POA: Diagnosis present

## 2015-09-29 DIAGNOSIS — J9611 Chronic respiratory failure with hypoxia: Secondary | ICD-10-CM | POA: Diagnosis present

## 2015-09-29 DIAGNOSIS — E782 Mixed hyperlipidemia: Secondary | ICD-10-CM | POA: Diagnosis present

## 2015-09-29 DIAGNOSIS — D649 Anemia, unspecified: Secondary | ICD-10-CM | POA: Diagnosis not present

## 2015-09-29 DIAGNOSIS — T451X5A Adverse effect of antineoplastic and immunosuppressive drugs, initial encounter: Secondary | ICD-10-CM | POA: Diagnosis present

## 2015-09-29 DIAGNOSIS — E669 Obesity, unspecified: Secondary | ICD-10-CM | POA: Diagnosis present

## 2015-09-29 DIAGNOSIS — I951 Orthostatic hypotension: Secondary | ICD-10-CM

## 2015-09-29 DIAGNOSIS — N400 Enlarged prostate without lower urinary tract symptoms: Secondary | ICD-10-CM | POA: Diagnosis not present

## 2015-09-29 DIAGNOSIS — G8929 Other chronic pain: Secondary | ICD-10-CM | POA: Diagnosis present

## 2015-09-29 DIAGNOSIS — D61810 Antineoplastic chemotherapy induced pancytopenia: Secondary | ICD-10-CM | POA: Diagnosis not present

## 2015-09-29 DIAGNOSIS — Z7982 Long term (current) use of aspirin: Secondary | ICD-10-CM

## 2015-09-29 DIAGNOSIS — J449 Chronic obstructive pulmonary disease, unspecified: Secondary | ICD-10-CM | POA: Diagnosis present

## 2015-09-29 DIAGNOSIS — J910 Malignant pleural effusion: Secondary | ICD-10-CM | POA: Diagnosis present

## 2015-09-29 DIAGNOSIS — M069 Rheumatoid arthritis, unspecified: Secondary | ICD-10-CM | POA: Diagnosis present

## 2015-09-29 DIAGNOSIS — Z7952 Long term (current) use of systemic steroids: Secondary | ICD-10-CM | POA: Diagnosis not present

## 2015-09-29 DIAGNOSIS — M549 Dorsalgia, unspecified: Secondary | ICD-10-CM

## 2015-09-29 DIAGNOSIS — I130 Hypertensive heart and chronic kidney disease with heart failure and stage 1 through stage 4 chronic kidney disease, or unspecified chronic kidney disease: Secondary | ICD-10-CM | POA: Diagnosis present

## 2015-09-29 DIAGNOSIS — D6481 Anemia due to antineoplastic chemotherapy: Principal | ICD-10-CM | POA: Diagnosis present

## 2015-09-29 DIAGNOSIS — Z7901 Long term (current) use of anticoagulants: Secondary | ICD-10-CM

## 2015-09-29 DIAGNOSIS — C8335 Diffuse large B-cell lymphoma, lymph nodes of inguinal region and lower limb: Secondary | ICD-10-CM | POA: Diagnosis present

## 2015-09-29 DIAGNOSIS — I10 Essential (primary) hypertension: Secondary | ICD-10-CM | POA: Diagnosis present

## 2015-09-29 DIAGNOSIS — R0602 Shortness of breath: Secondary | ICD-10-CM | POA: Diagnosis not present

## 2015-09-29 DIAGNOSIS — Z6830 Body mass index (BMI) 30.0-30.9, adult: Secondary | ICD-10-CM

## 2015-09-29 DIAGNOSIS — D696 Thrombocytopenia, unspecified: Secondary | ICD-10-CM | POA: Diagnosis present

## 2015-09-29 DIAGNOSIS — I5033 Acute on chronic diastolic (congestive) heart failure: Secondary | ICD-10-CM | POA: Diagnosis present

## 2015-09-29 DIAGNOSIS — J9610 Chronic respiratory failure, unspecified whether with hypoxia or hypercapnia: Secondary | ICD-10-CM | POA: Diagnosis present

## 2015-09-29 DIAGNOSIS — Z9889 Other specified postprocedural states: Secondary | ICD-10-CM | POA: Insufficient documentation

## 2015-09-29 DIAGNOSIS — M79604 Pain in right leg: Secondary | ICD-10-CM

## 2015-09-29 LAB — CBC WITH DIFFERENTIAL/PLATELET
Basophils Absolute: 0 10*3/uL (ref 0.0–0.1)
Basophils Relative: 0 %
Eosinophils Absolute: 0.2 10*3/uL (ref 0.0–0.7)
Eosinophils Relative: 2 %
HCT: 17.1 % — ABNORMAL LOW (ref 39.0–52.0)
Hemoglobin: 5.7 g/dL — CL (ref 13.0–17.0)
Lymphocytes Relative: 8 %
Lymphs Abs: 0.6 10*3/uL — ABNORMAL LOW (ref 0.7–4.0)
MCH: 34.5 pg — ABNORMAL HIGH (ref 26.0–34.0)
MCHC: 33.3 g/dL (ref 30.0–36.0)
MCV: 103.6 fL — ABNORMAL HIGH (ref 78.0–100.0)
Monocytes Absolute: 0.7 10*3/uL (ref 0.1–1.0)
Monocytes Relative: 10 %
Neutro Abs: 6 10*3/uL (ref 1.7–7.7)
Neutrophils Relative %: 80 %
Platelets: 85 10*3/uL — ABNORMAL LOW (ref 150–400)
RBC: 1.65 MIL/uL — ABNORMAL LOW (ref 4.22–5.81)
RDW: 15.9 % — ABNORMAL HIGH (ref 11.5–15.5)
WBC: 7.5 10*3/uL (ref 4.0–10.5)

## 2015-09-29 LAB — COMPREHENSIVE METABOLIC PANEL
ALK PHOS: 71 U/L (ref 38–126)
ALT: 28 U/L (ref 17–63)
ANION GAP: 5 (ref 5–15)
AST: 21 U/L (ref 15–41)
Albumin: 2.6 g/dL — ABNORMAL LOW (ref 3.5–5.0)
BILIRUBIN TOTAL: 0.7 mg/dL (ref 0.3–1.2)
BUN: 48 mg/dL — ABNORMAL HIGH (ref 6–20)
CALCIUM: 7.9 mg/dL — AB (ref 8.9–10.3)
CO2: 29 mmol/L (ref 22–32)
CREATININE: 1.14 mg/dL (ref 0.61–1.24)
Chloride: 106 mmol/L (ref 101–111)
GFR calc non Af Amer: 58 mL/min — ABNORMAL LOW (ref >60)
GLUCOSE: 133 mg/dL — AB (ref 65–99)
Potassium: 4.3 mmol/L (ref 3.5–5.1)
SODIUM: 140 mmol/L (ref 135–145)
TOTAL PROTEIN: 4.9 g/dL — AB (ref 6.5–8.1)

## 2015-09-29 LAB — URIC ACID: Uric Acid, Serum: 3.4 mg/dL — ABNORMAL LOW (ref 4.4–7.6)

## 2015-09-29 LAB — PREPARE RBC (CROSSMATCH)

## 2015-09-29 LAB — GLUCOSE, CAPILLARY: GLUCOSE-CAPILLARY: 89 mg/dL (ref 65–99)

## 2015-09-29 LAB — PHOSPHORUS: Phosphorus: 3.4 mg/dL (ref 2.5–4.6)

## 2015-09-29 LAB — FOLATE: Folate: 27 ng/mL (ref >5.9)

## 2015-09-29 LAB — CHROMOSOME ANALYSIS, BONE MARROW

## 2015-09-29 LAB — IRON AND TIBC
IRON: 50 ug/dL (ref 45–182)
Saturation Ratios: 22 % (ref 17.9–39.5)
TIBC: 232 ug/dL — ABNORMAL LOW (ref 250–450)
UIBC: 182 ug/dL

## 2015-09-29 LAB — VITAMIN B12: Vitamin B-12: 754 pg/mL (ref 180–914)

## 2015-09-29 LAB — FERRITIN: FERRITIN: 259 ng/mL (ref 24–336)

## 2015-09-29 LAB — MAGNESIUM: Magnesium: 1.9 mg/dL (ref 1.7–2.4)

## 2015-09-29 LAB — BRAIN NATRIURETIC PEPTIDE: B NATRIURETIC PEPTIDE 5: 226 pg/mL — AB (ref 0.0–100.0)

## 2015-09-29 MED ORDER — OXYCODONE-ACETAMINOPHEN 10-325 MG PO TABS: 1.0000 | ORAL_TABLET | Freq: Four times a day (QID) | ORAL | Status: DC | PRN

## 2015-09-29 MED ORDER — DILTIAZEM HCL ER COATED BEADS 180 MG PO CP24
360.0000 mg | ORAL_CAPSULE | Freq: Every day | ORAL | Status: DC
  Administered 2015-09-30: 360 mg via ORAL
  Filled 2015-09-29: qty 2

## 2015-09-29 MED ORDER — SODIUM CHLORIDE 0.9 % IV SOLN
INTRAVENOUS | Status: DC
  Administered 2015-09-29 – 2015-09-30 (×2): via INTRAVENOUS

## 2015-09-29 MED ORDER — ENOXAPARIN SODIUM 40 MG/0.4ML ~~LOC~~ SOLN
140.0000 mg | SUBCUTANEOUS | Status: AC
  Filled 2015-09-29: qty 1.6

## 2015-09-29 MED ORDER — SODIUM CHLORIDE 0.9 % IJ SOLN
3.0000 mL | Freq: Two times a day (BID) | INTRAMUSCULAR | Status: DC
  Administered 2015-09-29: 3 mL via INTRAVENOUS

## 2015-09-29 MED ORDER — FUROSEMIDE 10 MG/ML IJ SOLN
20.0000 mg | Freq: Once | INTRAMUSCULAR | Status: AC
  Administered 2015-09-29: 20 mg via INTRAVENOUS

## 2015-09-29 MED ORDER — FUROSEMIDE 10 MG/ML IJ SOLN
20.0000 mg | Freq: Once | INTRAMUSCULAR | Status: AC
  Filled 2015-09-29: qty 2

## 2015-09-29 MED ORDER — OXYCODONE-ACETAMINOPHEN 5-325 MG PO TABS: 1.0000 | ORAL_TABLET | Freq: Four times a day (QID) | ORAL | Status: DC | PRN

## 2015-09-29 MED ORDER — OXYCODONE HCL 5 MG PO TABS
10.0000 mg | ORAL_TABLET | ORAL | Status: DC | PRN
  Administered 2015-09-29 – 2015-09-30 (×4): 10 mg via ORAL
  Filled 2015-09-29 (×4): qty 2

## 2015-09-29 MED ORDER — ACETAMINOPHEN 325 MG PO TABS
650.0000 mg | ORAL_TABLET | Freq: Once | ORAL | Status: AC
  Administered 2015-09-29: 650 mg via ORAL
  Filled 2015-09-29: qty 2

## 2015-09-29 MED ORDER — FLUTICASONE PROPIONATE 50 MCG/ACT NA SUSP
2.0000 | Freq: Every day | NASAL | Status: DC
  Administered 2015-09-30 – 2015-10-02 (×3): 2 via NASAL
  Filled 2015-09-29: qty 16

## 2015-09-29 MED ORDER — ONDANSETRON HCL 4 MG PO TABS: 8.0000 mg | ORAL_TABLET | Freq: Three times a day (TID) | ORAL | Status: DC | PRN

## 2015-09-29 MED ORDER — PANTOPRAZOLE SODIUM 40 MG PO TBEC
40.0000 mg | DELAYED_RELEASE_TABLET | Freq: Every day | ORAL | Status: DC
  Administered 2015-09-30 – 2015-10-02 (×3): 40 mg via ORAL
  Filled 2015-09-29 (×3): qty 1

## 2015-09-29 MED ORDER — MORPHINE SULFATE (PF) 4 MG/ML IV SOLN
2.0000 mg | INTRAVENOUS | Status: AC
  Administered 2015-09-29: 2 mg via INTRAVENOUS
  Filled 2015-09-29: qty 1

## 2015-09-29 MED ORDER — LORATADINE 10 MG PO TABS
10.0000 mg | ORAL_TABLET | Freq: Every day | ORAL | Status: DC
  Administered 2015-09-30 – 2015-10-02 (×3): 10 mg via ORAL
  Filled 2015-09-29 (×3): qty 1

## 2015-09-29 MED ORDER — IPRATROPIUM-ALBUTEROL 0.5-2.5 (3) MG/3ML IN SOLN: 3.0000 mL | Freq: Four times a day (QID) | RESPIRATORY_TRACT | Status: DC | PRN

## 2015-09-29 MED ORDER — SODIUM CHLORIDE 0.9 % IV SOLN: Freq: Once | INTRAVENOUS | Status: AC

## 2015-09-29 MED ORDER — GABAPENTIN 300 MG PO CAPS
600.0000 mg | ORAL_CAPSULE | Freq: Two times a day (BID) | ORAL | Status: DC
Start: 2015-09-29 — End: 2015-10-02
  Administered 2015-09-29 – 2015-10-02 (×6): 600 mg via ORAL
  Filled 2015-09-29 (×7): qty 2

## 2015-09-29 MED ORDER — DIPHENHYDRAMINE HCL 25 MG PO CAPS
25.0000 mg | ORAL_CAPSULE | Freq: Once | ORAL | Status: AC
  Administered 2015-09-29: 25 mg via ORAL
  Filled 2015-09-29: qty 1

## 2015-09-29 MED ORDER — DIGOXIN 125 MCG PO TABS
125.0000 ug | ORAL_TABLET | Freq: Every day | ORAL | Status: DC
  Administered 2015-09-30 – 2015-10-02 (×3): 125 ug via ORAL
  Filled 2015-09-29 (×3): qty 1

## 2015-09-29 MED ORDER — ALLOPURINOL 300 MG PO TABS
300.0000 mg | ORAL_TABLET | Freq: Every day | ORAL | Status: DC
  Administered 2015-09-30 – 2015-10-02 (×3): 300 mg via ORAL
  Filled 2015-09-29 (×3): qty 1

## 2015-09-29 MED ORDER — CETYLPYRIDINIUM CHLORIDE 0.05 % MT LIQD
7.0000 mL | Freq: Two times a day (BID) | OROMUCOSAL | Status: DC
  Administered 2015-09-29 – 2015-10-02 (×5): 7 mL via OROMUCOSAL

## 2015-09-29 MED ORDER — FUROSEMIDE 20 MG PO TABS
20.0000 mg | ORAL_TABLET | Freq: Every day | ORAL | Status: DC
  Administered 2015-09-30: 20 mg via ORAL
  Filled 2015-09-29: qty 1

## 2015-09-29 MED ORDER — SODIUM CHLORIDE 0.9 % IV SOLN
INTRAVENOUS | Status: DC
  Administered 2015-09-29: 10:00:00 via INTRAVENOUS

## 2015-09-29 MED ORDER — OXYCODONE HCL 5 MG PO TABS
ORAL_TABLET | ORAL | Status: AC
  Filled 2015-09-29: qty 1

## 2015-09-29 MED ORDER — OXYCODONE HCL 5 MG PO TABS
5.0000 mg | ORAL_TABLET | Freq: Once | ORAL | Status: AC
  Administered 2015-09-29: 5 mg via ORAL

## 2015-09-29 MED ORDER — TAMSULOSIN HCL 0.4 MG PO CAPS
0.4000 mg | ORAL_CAPSULE | Freq: Every day | ORAL | Status: DC
  Administered 2015-09-29 – 2015-10-01 (×3): 0.4 mg via ORAL
  Filled 2015-09-29 (×3): qty 1

## 2015-09-29 MED ORDER — FOLIC ACID 1 MG PO TABS
1.0000 mg | ORAL_TABLET | Freq: Every day | ORAL | Status: DC
  Administered 2015-09-30 – 2015-10-02 (×3): 1 mg via ORAL
  Filled 2015-09-29 (×3): qty 1

## 2015-09-29 MED ORDER — OXYCODONE HCL 5 MG PO TABS: 5.0000 mg | ORAL_TABLET | Freq: Four times a day (QID) | ORAL | Status: DC | PRN

## 2015-09-29 NOTE — Patient Instructions (Addendum)
[place] at [facility] Discharge Instructions  RECOMMENDATIONS MADE BY THE CONSULTANT AND ANY TEST RESULTS WILL BE SENT TO YOUR REFERRING PHYSICIAN.  Exam and discussion by Robynn Pane, PA-C Labs and chest xray today IV fluids today Thank you for choosing [place] at [facility] to provide your oncology and hematology care.  To afford each patient quality time with our provider, please arrive at least 15 minutes before your scheduled appointment time.    You need to re-schedule your appointment should you arrive 10 or more minutes late.  We strive to give you quality time with our providers, and arriving late affects you and other patients whose appointments are after yours.  Also, if you no show three or more times for appointments you may be dismissed from the clinic at the providers discretion.     Again, thank you for choosing [facility]  Our hope is that these requests will decrease the amount of time that you wait before being seen by our physicians.       _____________________________________________________________  Should you have questions after your visit to [facility], please contact our office at [phone] between the hours of 8:30 a.m. and 4:30 p.m.  Voicemails left after 4:30 p.m. will not be returned until the following business day.  For prescription refill requests, have your pharmacy contact our office.

## 2015-09-29 NOTE — Progress Notes (Signed)
LABS DRAWN

## 2015-09-29 NOTE — Progress Notes (Signed)
Patient directly admitted to the hospital from the clinic today.

## 2015-09-29 NOTE — Addendum Note (Signed)
Addended by: Baird Cancer on: 09/29/2015 12:32 PM   Modules accepted: Orders

## 2015-09-29 NOTE — Assessment & Plan Note (Deleted)
DLBCL, Stage IV with IPI of 3 with high-intermediate risk with 5 years OS of 50%, CR of 51% complicated by rheumatoid arthritis being treated with Enbrel and methotrexate, both of which discontinued in the setting of new diagnosis.  Oncology history updated.  Bone marrow Bx results reviewed with the patient and Dr. Gari Crown.  Today is a nadir check.  Labs today: CBC diff, CMET, Mg, Phos, and uric acid.  Return on 10/17 for follow-up and to embark on cycle 2 of chemotherapy.

## 2015-09-29 NOTE — H&P (Signed)
[name] is an 81 y.o. male being admitted to [facility] for profound anemia, recurrent pleural effusion, and dehydration.  Chief Complaint: Shortness of breath and orthostatic hypotension    HPI: [name] was seen in the clinic at the [facility] on 09/29/2015 for a nadir check following his first cycle of mini-R-CHOP complaining of the aforementioned complaints.  [name] is an 81 yo white American man with DLBCL, Stage IV with IPI of 3 with high-intermediate risk with 5 years OS of 83%, CR of 38% complicated by rheumatoid arthritis being treated with Enbrel and methotrexate, both of which discontinued in the setting of new diagnosis. Last ECHO on 05/20/2015 was 65-70%  CBC in the clinic today showed a hgb of 5.7. Please note that BMBX results also returned with NHL involvement.    Diffuse large B cell lymphoma ([place])   08/22/2015 - 08/25/2015 Hospital Admission Acute respiratory distress   08/22/2015 Imaging CTA chest- Right infrahilar lymph node versus central lung mass measuring 2.8 x 4.1 cm. Worsening mediastinal adenopathy and bilateral hilar adenopathy as described with the largest node over the subcarinal region measuring 2.7 cm by short axis...   08/24/2015 Pathology Results Diagnosis PLEURAL FLUID, RIGHT(SPECIMEN 1 OF 1 COLLECTED 08/24/15): ATYPICAL LYMPHOCYTES SUSPICIOUS FOR A LYMPHOPROLIFERATIVE PROCESS, SEE COMMENT. Tony Males MD   08/25/2015 Imaging CT abd/pelvis- Mild upper retroperitoneal (gastrohepatic ligament and right retrocrural) lymphadenopathy. Cirrhosis.   09/03/2015 PET scan Widespread metastatic adenopathy including the RIGHT cervical lymph nodes, LEFT axillary lymph nodes, mediastinal lymph, hilar lymph node, retroperitoneal lymph nodes and iliac lymph nodes and inguinal lymph nodes. Inguinal lymph nodes may be most....   09/11/2015 Pathology Results Diagnosis Lymph node, biopsy, right inguinal - DIFFUSE LARGE B CELL LYMPHOMA.   09/11/2015 Pathology Results Interpretation  Tissue-Flow Cytometry - MONOCLONAL B-CELL POPULATION IDENTIFIED. Diagnosis Comment: The findings are consistent with non-Hodgkin B-cell lymphoma. (BNS:ecj 09/16/2015)   09/13/2015 - 09/18/2015 Hospital Admission COPD exacerbation   09/18/2015 Bone Marrow Biopsy Bone Marrow, Aspirate,Biopsy, and Clot, left iliac crest: Despite limited findings, the features are worrisome for minimal involvement by B cell lymphoproliferative process, particularly given the previous history of large B cell lymphoma.    09/21/2015 -  Chemotherapy Mini-R-CHOP   I personally reviewed and went over laboratory results with the patient.  The results are noted within this dictation.  With his symptoms, I started him on IV fluids for presumed dehydration while I worked up his other complaints.  As information and data was resulted, it was clear that [name] requires hospitalization to address these issues.  He notes that beginning last night, he started to feel poor.  Knowing he had an appointment today for a check-up, he presented to the clinic.  "I just don't feel right."  "I feel short winded."  "When I get up, I just want to pass out."             Past Medical History  Diagnosis Date  . COPD (chronic obstructive pulmonary disease) (HCC)     Uses occasional nighttime O2  . Disseminated herpes zoster 2010  . GERD (gastroesophageal reflux disease)   . Asthma   . Pulmonary embolus ([place])     2003 and 2011  . Pulmonary nodules     Chest CT 09/11  . Mixed hyperlipidemia   . Chronic atrial fibrillation ([place])   . Lupus anticoagulant positive   . Essential hypertension, benign   . Cholelithiasis   . Rheumatoid arthritis(714.0)   . History  of chicken pox 1941; 2011  . Anemia   . Chronic lower back pain   . Chronic diastolic heart failure ([place])   . Cirrhosis ([place])     Questionable, AFP normal Feb 01/2012, hx of ETOH use   . Headache(784.0)   . Cervical spondylosis without myelopathy   . Stage III chronic kidney disease    . Cellulitis of leg, left 01/03/2015  . Aortic stenosis, moderate 05/21/2015  . Dysrhythmia     chronic AFib  . Diffuse large B cell lymphoma ([place]) 08/22/2015   Past Surgical History  Procedure Laterality Date  . Polypectomy  02/02/2012    RMR: Multiple colonic polyps removed, flat, tubular adenomas/ Left-sided diverticulosis  . Posterior lumbar vertebrae excision  2003; 2009; 2011  . Myringotomy      "3 times; both ears"  . Cataract extraction w/ intraocular lens  implant, bilateral    . Cholecystectomy  05/23/2012    Procedure: LAPAROSCOPIC CHOLECYSTECTOMY WITH INTRAOPERATIVE CHOLANGIOGRAM;  Surgeon: Stark Klein, MD;  Location: [place];  Service: General;  Laterality: N/A;  . Esophagogastroduodenoscopy  02/02/12    OQH:UTMLYYTK size hiatal hernia; otherwise normal exam  . Colonoscopy  01/24/2013    PTW:SFKCLEX polyps/colonic diverticulosis  . Back surgery    . Portacath placement    . Lymph node biopsy    . Lymph node biopsy Right 09/11/2015    Procedure: RIGHT INGUINAL LYMPH NODE BIOPSY;  Surgeon: Aviva Signs, MD;  Location: [facility];  Service: General;  Laterality: Right;  . Portacath placement Left 09/11/2015    Procedure: INSERTION PORT-A-CATH;  Surgeon: Aviva Signs, MD;  Location: [facility];  Service: General;  Laterality: Left;   Family History  Problem Relation Age of Onset  . Colon cancer Neg Hx   . Liver disease Neg Hx   . Diabetes Mother    Social History:  reports that he quit smoking about 17 years ago. His smoking use included Cigarettes. He has a 57 pack-year smoking history. He has never used smokeless tobacco. He reports that he does not drink alcohol or use illicit drugs.  Allergies:  Allergies  Allergen Reactions  . Cymbalta [Duloxetine Hcl] Other (See Comments)    Confusion   . Procaine Hcl Hives and Other (See Comments)    NOVOCAINE: Sweating, Confusion, Not in right state of mind.  [name]    unknown   Medications Prior to  Admission  Medication Sig Dispense Refill  . albuterol (PROAIR HFA) 108 (90 BASE) MCG/ACT inhaler Inhale 2 puffs into the lungs every 6 (six) hours as needed. Shortness of Breath 1 Inhaler 3  . allopurinol (ZYLOPRIM) 300 MG tablet Take 1 tablet (300 mg total) by mouth daily. 30 tablet 3  . aspirin EC 81 MG EC tablet Take 1 tablet (81 mg total) by mouth daily. 30 tablet 1  . cetirizine (ZYRTEC) 10 MG tablet Take 10 mg by mouth daily.      Marland Kitchen DIGOX 125 MCG tablet TAKE ONE TABLET BY MOUTH DAILY 30 tablet 6  . diltiazem (CARDIZEM CD) 360 MG 24 hr capsule Take 1 capsule (360 mg total) by mouth daily. 30 capsule 3  . ferrous sulfate 325 (65 FE) MG tablet Take 325 mg by mouth daily.    . fluticasone (FLONASE) 50 MCG/ACT nasal spray Place 2 sprays into both nostrils daily.   0  . folic acid (FOLVITE) 1 MG tablet Take 1 mg by mouth daily.     . furosemide (  LASIX) 40 MG tablet Take 0.5 tablets (20 mg total) by mouth daily. Take daily for prevention of fluid buildup.    . gabapentin (NEURONTIN) 300 MG capsule Take 2 capsules (600 mg total) by mouth 2 (two) times daily. 30 capsule 0  . guaiFENesin (MUCINEX) 600 MG 12 hr tablet Take 1 tablet (600 mg total) by mouth 2 (two) times daily. 20 tablet 0  . HYDROcodone-acetaminophen (NORCO) 10-325 MG per tablet Take 1 tablet by mouth every 12 (twelve) hours as needed for moderate pain.   0  . ipratropium-albuterol (DUONEB) 0.5-2.5 (3) MG/3ML SOLN Take 3 mLs by nebulization every 6 (six) hours as needed. 360 mL 1  . ketoconazole (NIZORAL) 2 % shampoo Apply 1 application topically 3 (three) times a week.     Marland Kitchen levofloxacin (LEVAQUIN) 750 MG tablet Take 1 tablet (750 mg total) by mouth daily. (Patient not taking: Reported on 09/21/2015) 2 tablet 0  . lidocaine-prilocaine (EMLA) cream Apply a quarter size amount to port site 1 hour prior to chemo. Do not rub in. Cover with plastic wrap. (Patient not taking: Reported on 09/29/2015) 30 g 3  . methotrexate (RHEUMATREX) 2.5 MG  tablet Take 7.5 mg by mouth 2 (two) times a week. *TAKE 3 TABLETS BY MOUTH TWICE WEEKLY ON FRIDAYS AND SATURDAYS*    . Omega-3 Fatty Acids (FISH OIL) 1000 MG CAPS Take 2 capsules by mouth 2 (two) times daily.     Marland Kitchen omeprazole (PRILOSEC) 40 MG capsule Take 40 mg by mouth daily.    . ondansetron (ZOFRAN) 8 MG tablet Take 1 tablet every 8 hours as needed for nausea/vomiting. (Patient not taking: Reported on 09/29/2015) 30 tablet 3  . oxyCODONE-acetaminophen (PERCOCET) 10-325 MG per tablet Take 1 tablet by mouth every 6 (six) hours as needed for pain. 30 tablet 0  . potassium chloride SA (K-DUR,KLOR-CON) 20 MEQ tablet Take 0.5 tablets (10 mEq total) by mouth daily. Starting 08/27/15.    Marland Kitchen predniSONE (DELTASONE) 20 MG tablet Take by mouth. On Days 1-5 of chemo take 4.5 tablets (17m) daily.    . prochlorperazine (COMPAZINE) 10 MG tablet Take 1 tablet (10 mg total) by mouth every 6 (six) hours as needed (Nausea or vomiting). (Patient not taking: Reported on 09/29/2015) 30 tablet 3  . tadalafil (CIALIS) 5 MG tablet Take 5 mg by mouth daily as needed for erectile dysfunction.     . tamsulosin (FLOMAX) 0.4 MG CAPS capsule Take 1 capsule (0.4 mg total) by mouth at bedtime. For prostate treatment. 30 capsule 3  . Vitamin D, Ergocalciferol, (DRISDOL) 50000 UNITS CAPS capsule Take 1 capsule by mouth once a week. friday  4  . XARELTO 15 MG TABS tablet Take 15 mg by mouth 2 (two) times daily with a meal. Not started taking yet  0   Results for orders placed or performed in visit on 09/29/15 (from the past 48 hour(s))  CBC with Differential     Status: Abnormal   Collection Time: 09/29/15 10:19 AM  Result Value Ref Range   WBC 7.5 4.0 - 10.5 K/uL   RBC 1.65 (L) 4.22 - 5.81 MIL/uL   Hemoglobin 5.7 (LL) 13.0 - 17.0 g/dL    Comment: RESULT REPEATED AND VERIFIED CRITICAL RESULT CALLED TO, READ BACK BY AND VERIFIED WITH: ROBINSON [name] RN ON [id number] AT 1100 ON 100416 BY RESSEGGER R    HCT 17.1 (L) 39.0 - 52.0 %   MCV  103.6 (H) 78.0 - 100.0 fL   MCH 34.5 (H)  26.0 - 34.0 pg   MCHC 33.3 30.0 - 36.0 g/dL   RDW 15.9 (H) 11.5 - 15.5 %   Platelets 85 (L) 150 - 400 K/uL   Neutrophils Relative % 80 %   Neutro Abs 6.0 1.7 - 7.7 K/uL   Lymphocytes Relative 8 %   Lymphs Abs 0.6 (L) 0.7 - 4.0 K/uL   Monocytes Relative 10 %   Monocytes Absolute 0.7 0.1 - 1.0 K/uL   Eosinophils Relative 2 %   Eosinophils Absolute 0.2 0.0 - 0.7 K/uL   Basophils Relative 0 %   Basophils Absolute 0.0 0.0 - 0.1 K/uL  Comprehensive metabolic panel     Status: Abnormal   Collection Time: 09/29/15 10:19 AM  Result Value Ref Range   Sodium 140 135 - 145 mmol/L   Potassium 4.3 3.5 - 5.1 mmol/L   Chloride 106 101 - 111 mmol/L   CO2 29 22 - 32 mmol/L   Glucose, Bld 133 (H) 65 - 99 mg/dL   BUN 48 (H) 6 - 20 mg/dL   Creatinine, Ser 1.14 0.61 - 1.24 mg/dL   Calcium 7.9 (L) 8.9 - 10.3 mg/dL   Total Protein 4.9 (L) 6.5 - 8.1 g/dL   Albumin 2.6 (L) 3.5 - 5.0 g/dL   AST 21 15 - 41 U/L   ALT 28 17 - 63 U/L   Alkaline Phosphatase 71 38 - 126 U/L   Total Bilirubin 0.7 0.3 - 1.2 mg/dL   GFR calc non Af Amer 58 (L) >60 mL/min   GFR calc Af Amer >60 >60 mL/min    Comment: (NOTE) The eGFR has been calculated using the CKD EPI equation. This calculation has not been validated in all clinical situations. eGFR's persistently <60 mL/min signify possible Chronic Kidney Disease.    Anion gap 5 5 - 15  Magnesium     Status: None   Collection Time: 09/29/15 10:19 AM  Result Value Ref Range   Magnesium 1.9 1.7 - 2.4 mg/dL  Phosphorus     Status: None   Collection Time: 09/29/15 10:19 AM  Result Value Ref Range   Phosphorus 3.4 2.5 - 4.6 mg/dL  Uric acid     Status: Abnormal   Collection Time: 09/29/15 10:19 AM  Result Value Ref Range   Uric Acid, Serum 3.4 (L) 4.4 - 7.6 mg/dL    Constitutional: positive for malaise Eyes: negative Ears, nose, mouth, throat, and face: negative Respiratory: positive for dyspnea on exertion Cardiovascular:  negative Gastrointestinal: negative Genitourinary:negative Musculoskeletal:negative Neurological: negative   There were no vitals taken for this visit. PHYSICAL EXAM: General appearance: alert, cooperative, appears stated age, fatigued, mild distress and pale Head: Normocephalic, without obvious abnormality, atraumatic Eyes: negative findings: lids and lashes normal, conjunctivae and sclerae normal and corneas clear Ears: negative Neck: supple, symmetrical, trachea midline Lungs: diminished breath sounds RLL, RML and RUL, dullness to percussion RLL and RML and wheezes LUL Heart: regular rate and rhythm, S1, S2 normal, no murmur, click, rub or gallop Abdomen: soft, non-tender; bowel sounds normal; no masses,  no organomegaly Extremities: extremities normal, atraumatic, no cyanosis or edema Skin: poor turgor, pale Neurologic: Grossly normal      Assessment/Plan DLBCL Pleural effusion, recurrent Bmbx with NHL involvement Profound Anemia, symptomatic  [name] will be admitted to [facility] for profound anemia, recurrent right pleural effusion, and dehydration.    Will start NS at 100 cc/hr for now. Will order BNP in outpatient setting. Will order 3 unit PRBCs for  anemia Will order 3 stool cards for anemia Will order anemia panel prior to transfusions in outpatient setting. Hold Xarelto in preparation for right thoracentesis on Thursday.  Order for Lovenox tomorrow AM (140 mg).  Will restart Xarelto following thoracentesis when hemostasis is confirmed. Labs tomorrow AM: CBC diff, CMET Order placed for IV morphine 2 mg IV.  Patient and plan discussed with Dr. Ancil Linsey and she is in agreement with the aforementioned.   KEFALAS,THOMAS 09/29/2015, 11:26 AM   Addendum: Discussed bone marrow results with Dr. Gari Crown.  He believes that his bone marrow aspiration and biopsy is positive for involvement of NHL.  With this information, he would benefit from intrathecal  chemotherapy due to the increased risk of CNS involvement with NHL.  KEFALAS,THOMAS, PA-C 09/29/2015 5:04 PM  As above.  Will discuss significance of BM involvement with patient and daughter as outpatient. He will need IT chemo.  Suspect his anemia may be from recent treatment and his bone marrow involvement. However ordered a anemia panel and stool cards. In addition the patient's bone marrow reserve is probably not optimal given his long-standing use of methotrexate. We will repeat a CBC in the a.m., he may need additional blood prior to discharge given his underlying pulmonary pathology.  We will continue to monitor his pain. He is an increase in low back pain over the last several days which I feel is secondary to his Neulasta injection. He is currently on a monitored bed, once his counts are improved and he is stable we will DC additional monitoring. Follow-up with the patient in the a.m. Donald Pore MD

## 2015-09-29 NOTE — Telephone Encounter (Signed)
..  CRITICAL VALUE ALERT Critical value received:  HGB 5.7 Date of notification:  09/29/2015 Time of notification: 1100 Critical value read back:  Yes.   Nurse who received alert:  TAR MD notified (1st page):  T. Sheldon Silvan PA-C

## 2015-09-29 NOTE — Progress Notes (Signed)
1058 Spoke with T.Kefalas,PA regarding chest xray results. Instructed to reduce fluid rate to 100 ml/hr until he could review xray results. Patient admitted to room 324.

## 2015-09-30 ENCOUNTER — Ambulatory Visit (HOSPITAL_COMMUNITY): Admission: RE | Admit: 2015-09-30 | Payer: Medicare Other | Source: Ambulatory Visit | Admitting: Internal Medicine

## 2015-09-30 ENCOUNTER — Encounter (HOSPITAL_COMMUNITY): Admission: RE | Payer: Self-pay | Source: Ambulatory Visit

## 2015-09-30 ENCOUNTER — Inpatient Hospital Stay (HOSPITAL_COMMUNITY): Payer: Medicare Other

## 2015-09-30 DIAGNOSIS — D61810 Antineoplastic chemotherapy induced pancytopenia: Secondary | ICD-10-CM

## 2015-09-30 DIAGNOSIS — J449 Chronic obstructive pulmonary disease, unspecified: Secondary | ICD-10-CM

## 2015-09-30 DIAGNOSIS — I482 Chronic atrial fibrillation: Secondary | ICD-10-CM

## 2015-09-30 DIAGNOSIS — D696 Thrombocytopenia, unspecified: Secondary | ICD-10-CM

## 2015-09-30 DIAGNOSIS — N400 Enlarged prostate without lower urinary tract symptoms: Secondary | ICD-10-CM

## 2015-09-30 DIAGNOSIS — J9611 Chronic respiratory failure with hypoxia: Secondary | ICD-10-CM

## 2015-09-30 DIAGNOSIS — I5033 Acute on chronic diastolic (congestive) heart failure: Secondary | ICD-10-CM

## 2015-09-30 DIAGNOSIS — I10 Essential (primary) hypertension: Secondary | ICD-10-CM

## 2015-09-30 LAB — CBC WITH DIFFERENTIAL/PLATELET
Basophils Absolute: 0 10*3/uL (ref 0.0–0.1)
Basophils Relative: 1 %
EOS PCT: 2 %
Eosinophils Absolute: 0.1 10*3/uL (ref 0.0–0.7)
HEMATOCRIT: 21.9 % — AB (ref 39.0–52.0)
HEMOGLOBIN: 7.7 g/dL — AB (ref 13.0–17.0)
LYMPHS ABS: 0.7 10*3/uL (ref 0.7–4.0)
LYMPHS PCT: 11 %
MCH: 33.8 pg (ref 26.0–34.0)
MCHC: 35.2 g/dL (ref 30.0–36.0)
MCV: 96.1 fL (ref 78.0–100.0)
MONO ABS: 0.4 10*3/uL (ref 0.1–1.0)
Monocytes Relative: 7 %
NEUTROS ABS: 4.8 10*3/uL (ref 1.7–7.7)
Neutrophils Relative %: 80 %
Platelets: 64 10*3/uL — ABNORMAL LOW (ref 150–400)
RBC: 2.28 MIL/uL — ABNORMAL LOW (ref 4.22–5.81)
RDW: 17.9 % — AB (ref 11.5–15.5)
WBC Morphology: INCREASED
WBC: 6.1 10*3/uL (ref 4.0–10.5)

## 2015-09-30 LAB — COMPREHENSIVE METABOLIC PANEL
ALBUMIN: 2.4 g/dL — AB (ref 3.5–5.0)
ALK PHOS: 60 U/L (ref 38–126)
ALT: 24 U/L (ref 17–63)
ANION GAP: 3 — AB (ref 5–15)
AST: 18 U/L (ref 15–41)
BILIRUBIN TOTAL: 2 mg/dL — AB (ref 0.3–1.2)
BUN: 34 mg/dL — AB (ref 6–20)
CALCIUM: 7.6 mg/dL — AB (ref 8.9–10.3)
CO2: 31 mmol/L (ref 22–32)
Chloride: 105 mmol/L (ref 101–111)
Creatinine, Ser: 0.93 mg/dL (ref 0.61–1.24)
GFR calc Af Amer: >60 mL/min (ref >60)
GLUCOSE: 81 mg/dL (ref 65–99)
POTASSIUM: 3.7 mmol/L (ref 3.5–5.1)
Sodium: 139 mmol/L (ref 135–145)
TOTAL PROTEIN: 4.4 g/dL — AB (ref 6.5–8.1)

## 2015-09-30 LAB — PREPARE RBC (CROSSMATCH)

## 2015-09-30 SURGERY — COLONOSCOPY
Anesthesia: Moderate Sedation

## 2015-09-30 SURGERY — COLONOSCOPY WITH PROPOFOL
Anesthesia: Monitor Anesthesia Care

## 2015-09-30 MED ORDER — FUROSEMIDE 10 MG/ML IJ SOLN
20.0000 mg | Freq: Once | INTRAMUSCULAR | Status: AC
  Administered 2015-09-30: 20 mg via INTRAVENOUS
  Filled 2015-09-30: qty 2

## 2015-09-30 MED ORDER — OXYCODONE HCL 5 MG PO TABS
10.0000 mg | ORAL_TABLET | ORAL | Status: DC
  Administered 2015-09-30 – 2015-10-01 (×4): 10 mg via ORAL
  Filled 2015-09-30 (×5): qty 2

## 2015-09-30 MED ORDER — SODIUM CHLORIDE 0.9 % IV SOLN
Freq: Once | INTRAVENOUS | Status: AC
  Administered 2015-09-30: 14:00:00 via INTRAVENOUS

## 2015-09-30 MED ORDER — MORPHINE SULFATE 2 MG/ML IJ SOLN: 2.0000 mg | INTRAMUSCULAR | Status: DC

## 2015-09-30 MED ORDER — DIPHENHYDRAMINE HCL 25 MG PO CAPS
25.0000 mg | ORAL_CAPSULE | Freq: Once | ORAL | Status: AC
Start: 2015-09-30 — End: 2015-09-30
  Administered 2015-09-30: 25 mg via ORAL
  Filled 2015-09-30: qty 1

## 2015-09-30 MED ORDER — ENSURE ENLIVE PO LIQD
237.0000 mL | Freq: Two times a day (BID) | ORAL | Status: DC
  Administered 2015-09-30 – 2015-10-01 (×2): 237 mL via ORAL

## 2015-09-30 MED ORDER — FUROSEMIDE 10 MG/ML IJ SOLN
20.0000 mg | Freq: Two times a day (BID) | INTRAMUSCULAR | Status: DC
  Administered 2015-10-01: 20 mg via INTRAVENOUS
  Filled 2015-09-30 (×2): qty 2

## 2015-09-30 MED ORDER — ACETAMINOPHEN 325 MG PO TABS
650.0000 mg | ORAL_TABLET | Freq: Once | ORAL | Status: AC
  Administered 2015-09-30: 650 mg via ORAL
  Filled 2015-09-30: qty 2

## 2015-09-30 MED ORDER — DILTIAZEM HCL ER COATED BEADS 180 MG PO CP24
180.0000 mg | ORAL_CAPSULE | Freq: Every day | ORAL | Status: DC
  Administered 2015-10-01 – 2015-10-02 (×2): 180 mg via ORAL
  Filled 2015-09-30 (×2): qty 1

## 2015-09-30 MED ORDER — MORPHINE SULFATE (PF) 2 MG/ML IV SOLN
2.0000 mg | INTRAVENOUS | Status: AC
  Administered 2015-09-30: 2 mg via INTRAVENOUS
  Filled 2015-09-30: qty 1

## 2015-09-30 NOTE — Consult Note (Signed)
[facility] Medical Consultation  [name] FYB:[id number] DOB: 03/10/1931 DOA: 09/29/2015 PCP: [name], MD   Requesting physician: Dr. Whitney Muse Date of consultation: 09/30/2015 Reason for consultation: Medical management  Impression/Recommendations Active Problems:   Essential hypertension, benign   COPD (chronic obstructive pulmonary disease) (HCC)   Anemia   Thrombocytopenia (HCC)   Acute on chronic diastolic CHF (congestive heart failure) (HCC)   Diffuse large B cell lymphoma (HCC)   Chronic a-fib (HCC)   BPH (benign prostatic hyperplasia)   Pleural effusion   Recurrent right pleural effusion   Chronic respiratory failure ([place])    1. Anemia. Possibly related to recent chemotherapy as well as diminished bone marrow reserve in the setting of marrow involvement with lymphoma. Patient was transfused 3 units of PRBCs on 10/4. He has been ordered another 2 units of PRBCs on 10/5. Stool cards have been ordered although he has not had any significant GI bleeding thus far. Given his mildly elevated which could indicate an element of hemolysis. Fractionation of bilirubin is ordered for a.m. Continue to monitor. 2. Thrombocytopenia. Likely related to underlying malignancy. Continue to monitor. No evidence of bleeding at this time. 3. Recurrent right-sided pleural effusion. This has been aspirated twice in the past. Appears to be recannulating a faster rate. Plans are for possible repeat thoracentesis on 10/6. This is a recurring problem, he may benefit from a Pleurx catheter. 4. Atrial fibrillation. Currently rate controlled. On diltiazem and digoxin. Blood pressures are running low so we will decrease the dose of diltiazem. Currently on Lovenox for anticoagulation. 5. Acute on chronic diastolic congestive heart failure. Patient has evidence of volume overload with significant worsening lower extremity edema and shortness of breath. We'll start him on low-dose intravenous  Lasix. Hold further IV fluids. 6. COPD . no evidence of wheezing at this time. Continue bronchodilators as needed. 7. Chronic respiratory failure with hypoxia. Chronically on 2 L of oxygen appears to be new baseline. 8. Acute on chronic lower back pain. Plain films of lumbar spine and an order for oncology. If these are unrevealing a need to consider further imaging with MRI. 9. Diffuse large B-cell lymphoma. Further chemotherapy per oncology. Continue allopurinol for prevention of tumor lysis syndrome.   Chief Complaint: Shortness of breath  HPI:  This is an 81 year old patient with history of B-cell lymphoma who was recently discharged from the hospital after being treated for COPD exacerbation and recurrent right-sided pleural effusion status post thoracentesis. He followed up with oncology earlier last week and underwent his first chemotherapy. His daughter reports that since 10/2 reason complaining of worsening lower back pain. This appears to be worse than his regular chronic back pain. He denies any fall/trauma or heavy lifting. He denies any bowel or bladder dysfunction. He says he's been unable to walk due to pain. He came back to the oncology office yesterday for follow-up complaining of progressive shortness of breath. Workup indicated a hemoglobin of 5.7 down from 11.8 on 9/23. He was also found to have a recurrent right-sided pleural effusion. He does not describe any hematemesis, hematochezia or any other significant sources of bleeding. He was admitted by the oncology service for transfusion and possible thoracentesis. Due to the medical complexity in his care, hospitalist service has been requested to see the patient and assume care.  Review of Systems:  Pertinent positives as per history of present illness, otherwise negative  Past Medical History  Diagnosis Date  . COPD (chronic obstructive pulmonary disease) ([place])  Uses occasional nighttime O2  . Disseminated herpes zoster  2010  . GERD (gastroesophageal reflux disease)   . Asthma   . Pulmonary embolus ([place])     2003 and 2011  . Pulmonary nodules     Chest CT 09/11  . Mixed hyperlipidemia   . Chronic atrial fibrillation ([place])   . Lupus anticoagulant positive   . Essential hypertension, benign   . Cholelithiasis   . Rheumatoid arthritis(714.0)   . History of chicken pox 1941; 2011  . Anemia   . Chronic lower back pain   . Chronic diastolic heart failure ([place])   . Cirrhosis ([place])     Questionable, AFP normal Feb 01/2012, hx of ETOH use   . Headache(784.0)   . Cervical spondylosis without myelopathy   . Stage III chronic kidney disease   . Cellulitis of leg, left 01/03/2015  . Aortic stenosis, moderate 05/21/2015  . Dysrhythmia     chronic AFib  . Diffuse large B cell lymphoma ([place]) 08/22/2015   Past Surgical History  Procedure Laterality Date  . Polypectomy  02/02/2012    RMR: Multiple colonic polyps removed, flat, tubular adenomas/ Left-sided diverticulosis  . Posterior lumbar vertebrae excision  2003; 2009; 2011  . Myringotomy      "3 times; both ears"  . Cataract extraction w/ intraocular lens  implant, bilateral    . Cholecystectomy  05/23/2012    Procedure: LAPAROSCOPIC CHOLECYSTECTOMY WITH INTRAOPERATIVE CHOLANGIOGRAM;  Surgeon: Stark Klein, MD;  Location: [place];  Service: General;  Laterality: N/A;  . Esophagogastroduodenoscopy  02/02/12    LKG:[id number] size hiatal hernia; otherwise normal exam  . Colonoscopy  01/24/2013    DGU:[id number] polyps/colonic diverticulosis  . Back surgery    . Portacath placement    . Lymph node biopsy    . Lymph node biopsy Right 09/11/2015    Procedure: RIGHT INGUINAL LYMPH NODE BIOPSY;  Surgeon: Aviva Signs, MD;  Location: [facility];  Service: General;  Laterality: Right;  . Portacath placement Left 09/11/2015    Procedure: INSERTION PORT-A-CATH;  Surgeon: Aviva Signs, MD;  Location: [facility];  Service: General;  Laterality: Left;   Social History:  reports that he  quit smoking about 17 years ago. His smoking use included Cigarettes. He has a 57 pack-year smoking history. He has never used smokeless tobacco. He reports that he does not drink alcohol or use illicit drugs.  Allergies  Allergen Reactions  . Cymbalta [Duloxetine Hcl] Other (See Comments)    Confusion   . Procaine Hcl Hives and Other (See Comments)    NOVOCAINE: Sweating, Confusion, Not in right state of mind.  [name]    unknown   Family History  Problem Relation Age of Onset  . Colon cancer Neg Hx   . Liver disease Neg Hx   . Diabetes Mother     Prior to Admission medications   Medication Sig Start Date End Date Taking? Authorizing Provider  allopurinol (ZYLOPRIM) 300 MG tablet Take 1 tablet (300 mg total) by mouth daily. 09/18/15  Yes Kathie Dike, MD  aspirin EC 81 MG EC tablet Take 1 tablet (81 mg total) by mouth daily. 09/18/15  Yes Kathie Dike, MD  cetirizine (ZYRTEC) 10 MG tablet Take 10 mg by mouth daily.     Yes Historical Provider, MD  [place] 125 MCG tablet TAKE ONE TABLET BY MOUTH DAILY 09/28/15  Yes Satira Sark, MD  diltiazem (CARDIZEM CD) 360 MG 24 hr capsule Take  1 capsule (360 mg total) by mouth daily. 05/25/15  Yes Rexene Alberts, MD  ferrous sulfate 325 (65 FE) MG tablet Take 325 mg by mouth daily.   Yes Historical Provider, MD  fluticasone (FLONASE) 50 MCG/ACT nasal spray Place 2 sprays into both nostrils daily.  08/25/15  Yes Historical Provider, MD  folic acid (FOLVITE) 1 MG tablet Take 1 mg by mouth daily.    Yes Historical Provider, MD  furosemide (LASIX) 40 MG tablet Take 0.5 tablets (20 mg total) by mouth daily. Take daily for prevention of fluid buildup. 05/25/15  Yes Rexene Alberts, MD  gabapentin (NEURONTIN) 300 MG capsule Take 2 capsules (600 mg total) by mouth 2 (two) times daily. 08/25/15  Yes Lezlie Octave Black, NP  guaiFENesin (MUCINEX) 600 MG 12 hr tablet Take 1 tablet (600 mg total) by mouth 2 (two) times daily.  09/18/15  Yes Kathie Dike, MD  ipratropium-albuterol (DUONEB) 0.5-2.5 (3) MG/3ML SOLN Take 3 mLs by nebulization every 6 (six) hours as needed. Patient taking differently: Take 3 mLs by nebulization every 6 (six) hours as needed (shortness of breath).  09/18/15  Yes Kathie Dike, MD  ketoconazole (NIZORAL) 2 % shampoo Apply 1 application topically 3 (three) times a week.  05/21/13  Yes Historical Provider, MD  lidocaine-prilocaine (EMLA) cream Apply a quarter size amount to port site 1 hour prior to chemo. Do not rub in. Cover with plastic wrap. 09/17/15  Yes Patrici Ranks, MD  methotrexate (RHEUMATREX) 2.5 MG tablet Take 7.5 mg by mouth 2 (two) times a week. *TAKE 3 TABLETS BY MOUTH TWICE WEEKLY ON FRIDAYS AND SATURDAYS*   Yes Historical Provider, MD  Omega-3 Fatty Acids (FISH OIL) 1000 MG CAPS Take 2 capsules by mouth 2 (two) times daily.    Yes Historical Provider, MD  omeprazole (PRILOSEC) 40 MG capsule Take 40 mg by mouth daily.   Yes Historical Provider, MD  oxyCODONE-acetaminophen (PERCOCET) 10-325 MG per tablet Take 1 tablet by mouth every 6 (six) hours as needed for pain. 09/11/15  Yes Aviva Signs, MD  potassium chloride SA (K-DUR,KLOR-CON) 20 MEQ tablet Take 0.5 tablets (10 mEq total) by mouth daily. Starting 08/27/15. 08/25/15  Yes Lezlie Octave Black, NP  predniSONE (DELTASONE) 20 MG tablet Take by mouth. On Days 1-5 of chemo take 4.5 tablets (90mg ) daily.   Yes Historical Provider, MD  tamsulosin (FLOMAX) 0.4 MG CAPS capsule Take 1 capsule (0.4 mg total) by mouth at bedtime. For prostate treatment. 05/25/15  Yes Rexene Alberts, MD  Vitamin D, Ergocalciferol, (DRISDOL) 50000 UNITS CAPS capsule Take 1 capsule by mouth once a week. friday 04/27/15  Yes Historical Provider, MD  XARELTO 15 MG TABS tablet Take 15 mg by mouth 2 (two) times daily with a meal. Not started taking yet 09/02/15  Yes Historical Provider, MD  albuterol (PROAIR HFA) 108 (90 BASE) MCG/ACT inhaler Inhale 2 puffs into the lungs every  6 (six) hours as needed. Shortness of Breath 08/25/15   Radene Gunning, NP  HYDROcodone-acetaminophen (NORCO) 10-325 MG per tablet Take 1 tablet by mouth every 12 (twelve) hours as needed for moderate pain.  08/25/15   Historical Provider, MD  ondansetron (ZOFRAN) 8 MG tablet Take 1 tablet every 8 hours as needed for nausea/vomiting. Patient taking differently: Take 8 mg by mouth every 8 (eight) hours as needed for nausea or vomiting.  09/16/15   Baird Cancer, PA-C  prochlorperazine (COMPAZINE) 10 MG tablet Take 1 tablet (10 mg total) by mouth every 6 (  six) hours as needed (Nausea or vomiting). Patient taking differently: Take 10 mg by mouth every 6 (six) hours as needed (Nausea or vomiting). nausea 09/16/15   Baird Cancer, PA-C  tadalafil (CIALIS) 5 MG tablet Take 5 mg by mouth daily as needed for erectile dysfunction.  05/18/15   Historical Provider, MD   Physical Exam: Blood pressure 88/53, pulse 79, temperature 97.9 F (36.6 C), temperature source Oral, resp. rate 20, height 5\' 8"  (1.727 m), weight 90.719 kg (200 lb), SpO2 100 %. Filed Vitals:   09/30/15 1638  BP: 88/53  Pulse: 79  Temp: 97.9 F (36.6 C)  Resp: 20     General:  Sitting up in bed, no acute distress  Eyes: PERRLA, EOMI  ENT: mucous membranes are moist  Cardiovascular: s1, s2 irregular  Respiratory: diminished breath sounds at right base, no wheezing  Abdomen: soft, nt, nd, bs+  Skin: no rashes  Musculoskeletal: 2+ edema in LE bilaterally  Psychiatric: normal affect, cooperative with exam  Neurologic: grossly intact, non focal  Labs on Admission:  Basic Metabolic Panel:  Recent Labs Lab 09/29/15 1019 09/30/15 0549  NA 140 139  K 4.3 3.7  CL 106 105  CO2 29 31  GLUCOSE 133* 81  BUN 48* 34*  CREATININE 1.14 0.93  CALCIUM 7.9* 7.6*  MG 1.9  --   PHOS 3.4  --    Liver Function Tests:  Recent Labs Lab 09/29/15 1019 09/30/15 0549  AST 21 18  ALT 28 24  ALKPHOS 71 60  BILITOT 0.7 2.0*   PROT 4.9* 4.4*  ALBUMIN 2.6* 2.4*   No results for input(s): LIPASE, AMYLASE in the last 168 hours. No results for input(s): AMMONIA in the last 168 hours. CBC:  Recent Labs Lab 09/29/15 1019 09/30/15 0549  WBC 7.5 6.1  NEUTROABS 6.0 4.8  HGB 5.7* 7.7*  HCT 17.1* 21.9*  MCV 103.6* 96.1  PLT 85* 64*   Cardiac Enzymes: No results for input(s): CKTOTAL, CKMB, CKMBINDEX, TROPONINI in the last 168 hours. BNP: Invalid input(s): POCBNP CBG:  Recent Labs Lab 09/29/15 2057  GLUCAP 89    Radiological Exams on Admission: Dg Chest Port 1 View  09/29/2015   CLINICAL DATA:  Shortness of breath and chest pain.  B-cell lymphoma  EXAM: PORTABLE CHEST 1 VIEW  COMPARISON:  September 15, 2015  FINDINGS: There is a moderate right pleural effusion with right middle and lower lobe airspace consolidations/atelectasis. Elsewhere, the interstitium is somewhat diffusely prominent. Heart is upper normal in size with pulmonary vascularity within normal limits. Port-A-Cath tip is in the superior vena cava just beyond the junction with the left innominate vein. No pneumothorax. No bone lesions. There is atherosclerotic change in the aorta.  IMPRESSION: Right effusion with right middle and lower lobe atelectasis/consolidation. Interstitium is prominent in a generalized manner; question chronic inflammatory type change versus mild chronic congestive heart failure.   Electronically Signed   By: Lowella Grip III M.D.   On: 09/29/2015 10:35    Time spent: 63mins  Delores Edelstein [facility] Pager [phone]  If 7PM-7AM, please contact night-coverage [email or web address] Password [email or web address] 09/30/2015, 6:08 PM

## 2015-09-30 NOTE — Progress Notes (Addendum)
Subjective: Patient in bed.  Empty food tray.  He reports his back pain is similar to it was yesterday.  He notes it is not worth complaining of his back pain because it takes his nurse over an hour to give him his pain medication after he asks for it.  He reports that his breathing is improved.  Objective: Vital signs in last 24 hours: Temp:  [97.8 F (36.6 C)-99.1 F (37.3 C)] 97.9 F (36.6 C) (10/05 1638) Pulse Rate:  [61-99] 79 (10/05 1638) Resp:  [18-20] 20 (10/05 1638) BP: (88-118)/(44-106) 88/53 mmHg (10/05 1638) SpO2:  [97 %-100 %] 100 % (10/05 1638)  Intake/Output from previous day: 10/04 0800 - 10/05 0759 In: 1998 [P.O.:440; I.V.:500; Blood:1058] Out: 825 [Urine:825] Intake/Output this shift: Total I/O In: 570 [P.O.:240; Blood:330] Out: 400 [Urine:400]  General appearance: alert, cooperative, mild distress and in bed  Lab Results:   Recent Labs  09/29/15 1019 09/30/15 0549  WBC 7.5 6.1  HGB 5.7* 7.7*  HCT 17.1* 21.9*  PLT 85* 64*   BMET  Recent Labs  09/29/15 1019 09/30/15 0549  NA 140 139  K 4.3 3.7  CL 106 105  CO2 29 31  GLUCOSE 133* 81  BUN 48* 34*  CREATININE 1.14 0.93  CALCIUM 7.9* 7.6*    Studies/Results: Dg Chest Port 1 View  09/29/2015   CLINICAL DATA:  Shortness of breath and chest pain.  B-cell lymphoma  EXAM: PORTABLE CHEST 1 VIEW  COMPARISON:  September 15, 2015  FINDINGS: There is a moderate right pleural effusion with right middle and lower lobe airspace consolidations/atelectasis. Elsewhere, the interstitium is somewhat diffusely prominent. Heart is upper normal in size with pulmonary vascularity within normal limits. Port-A-Cath tip is in the superior vena cava just beyond the junction with the left innominate vein. No pneumothorax. No bone lesions. There is atherosclerotic change in the aorta.  IMPRESSION: Right effusion with right middle and lower lobe atelectasis/consolidation. Interstitium is prominent in a generalized manner;  question chronic inflammatory type change versus mild chronic congestive heart failure.   Electronically Signed   By: Lowella Grip III M.D.   On: 09/29/2015 10:35    Medications: I have reviewed the patient's current medications.  Assessment/Plan:  Back pain- plain films of L-spine to evaluate for compression fracture.  Will order 2 mg of IV morphine now.  Will change PO pain medication to scheduled dosing because he reports that nursing is not providing pain medication in a timely fashion, even with him asking for pain medication.    No BM today- if no BM tomorrow, will give MOM  Anemia- profound.  Etiology not completely clear.  Secondary to antineoplastic therapy +/- involvement of lymphoma in bone marrow.  S/P 3 units of PRBCs on 09/29/2015.  2 more units given today (09/30/2015).  Right pleural effusion, recurrent.- Xarelto was discontinued on 09/28/2015 (at time of admission).  Full dose Lovenox 1.5 mg/kg given this AM (09/30/2015).  No anticoagulation tomorrow AM.  Plan is for a right thoracentesis for therapeutic reasons only.  No need for cytology.  Bilirubin increase- unknown etiology at this time.  Fractionated bilirubin ordered for tomorrow AM.  CMET tomorrow AM.  Labs tomorrow: CBC diff, LDH.  Case discussed with Hospitalist, Dr. Roderic Palau.  He will consume medical management and become the patient's attending during this hospitalization.   LOS: 1 day    KEFALAS,THOMAS 09/30/2015   As above.  Not sure of cause of severity of his anemia, may be secondary  to possible marrow involvement. Follow daily CBC's. For thoracentesis tomorrow. Donald Pore MD

## 2015-09-30 NOTE — Care Management Note (Signed)
Case Management Note  Patient Details  Name: [name] MRN: [id number] Date of Birth: 06-24-1931  Subjective/Objective:                  Pt admitted from home with anemia. Pt lives alone and will return home at discharge. Pt is independent with ADL's. Pt has home O2 with [facility]. Pt has daughters who are very active in the care of the pt.  Action/Plan: Pt would like new neb machine for home from [facility]. Will place order and [facility] will deliver neb machine to pts home after discharge. Will continue to follow for discharge planning needs.  Expected Discharge Date:  10/02/15               Expected Discharge Plan:  Home/Self Care  In-House Referral:  NA  Discharge planning Services  CM Consult  Post Acute Care Choice:  Durable Medical Equipment Choice offered to:  Patient  DME Arranged:  Nebulizer machine DME Agency:  [place]:    [facility] Agency:     Status of Service:  In process, will continue to follow  Medicare Important Message Given:    Date Medicare IM Given:    Medicare IM give by:    Date Additional Medicare IM Given:    Additional Medicare Important Message give by:     If discussed at [place] of Stay Meetings, dates discussed:    Additional Comments:  Joylene Draft, RN 09/30/2015, 1:36 PM

## 2015-09-30 NOTE — Progress Notes (Signed)
Initial Nutrition Assessment  DOCUMENTATION CODES:  Obesity unspecified  INTERVENTION:  Ensure Enlive po BID, each supplement provides 350 kcal and 20 grams of protein  Gave Education regarding hydration, weight maintenance, and protein importance during therapy.   Left RD contact info, coupons,  and handouts titled "Increasing Protein and Calories" and "Dehydration".  NUTRITION DIAGNOSIS:  Increased nutrient needs related to cancer and cancer related treatments as evidenced by estimated nutritional requirements for the condition  GOAL:  Patient will meet greater than or equal to 90% of their needs  MONITOR:  Supplement acceptance, PO intake, Labs, Weight trends  REASON FOR ASSESSMENT:  Consult Assessment of nutrition requirement/status  ASSESSMENT:  81 yo male PMHx CKD 3, HTN, HLD, GERD, COPD, Anemia and most notably DLBCL who presents from the cancer center with SOB and hypotension. Found to have HGB of 5.7, recurrent pleural effusion and dehydration.  Spoke with pt and family. Pt is s/p 1 day of chemo. Pt states that his appetite has been pretty good with only a very slight decrease recently. Usually he has no problems, stating "I eat everything". He does not have ordered eating pattern and says he eats anywhere from 1-5 meals each day. He does not take any vit/mi supps, does not follow any therapeutic diet and does not drink any ONS. He had no n/v/d, but did have severe constipation last Friday. This has since resolved.   Per EMR documentation, his weight varies often (190-210 lbs) Pt states his normal weight is about 205 lbs +/- a lb or two.  Overall assessment: Obese elderly male that has a disordered eating pattern, but seems to have a generally good appetite.  Do not expect his intake to suffer too greatly  RD discussed the correlation between maintaining weight and treatment outcomes. Discussed importance of protein and the best food sources to obtain it from.   Also  addressed his dehydration. Pt says he drinks "whatever". Advised him to avoid surgery beverages as these can lead to worsen his hydration. He reportedly enjoys decaf coffee and splenda. Recommended drinking water or a low CHO sports drink to better hydrate himself. Gave a few tips to increase fluid intake.   Diet Order:  Diet regular Room service appropriate?: Yes; Fluid consistency:: Thin  Skin:  Ecchymosis, Dry, pale  Last BM:  10/4  Height:  Ht Readings from Last 1 Encounters:  09/29/15 5\' 8"  (1.727 m)   Weight:  Wt Readings from Last 1 Encounters:  09/29/15 200 lb (90.719 kg)   Wt Readings from Last 10 Encounters:  09/29/15 200 lb (90.719 kg)  09/29/15 201 lb 1.6 oz (91.218 kg)  09/21/15 205 lb 12.8 oz (93.35 kg)  09/21/15 205 lb 14.4 oz (93.396 kg)  09/18/15 212 lb 1.3 oz (96.2 kg)  09/11/15 206 lb (93.441 kg)  09/09/15 206 lb (93.441 kg)  09/04/15 202 lb (91.627 kg)  08/22/15 194 lb 8 oz (88.225 kg)  08/20/15 198 lb (89.812 kg)   Ideal Body Weight:  70 kg  BMI:  Body mass index is 30.42 kg/(m^2).  Estimated Nutritional Needs:  Kcal:  1700-1900 kcals (19-21 kcal/kg) Protein:  84-98 g (1.2-1.4 g/kg IBW) Fluid:  1.7-2 liters  EDUCATION NEEDS:   Education needs addressed  Burtis Junes RD, LDN Nutrition Pager: [phone] 09/30/2015 3:03 PM

## 2015-10-01 ENCOUNTER — Inpatient Hospital Stay (HOSPITAL_COMMUNITY): Payer: Medicare Other

## 2015-10-01 DIAGNOSIS — D6959 Other secondary thrombocytopenia: Secondary | ICD-10-CM

## 2015-10-01 DIAGNOSIS — R600 Localized edema: Secondary | ICD-10-CM

## 2015-10-01 DIAGNOSIS — E8809 Other disorders of plasma-protein metabolism, not elsewhere classified: Secondary | ICD-10-CM

## 2015-10-01 DIAGNOSIS — J910 Malignant pleural effusion: Secondary | ICD-10-CM

## 2015-10-01 DIAGNOSIS — R410 Disorientation, unspecified: Secondary | ICD-10-CM

## 2015-10-01 DIAGNOSIS — M545 Low back pain: Secondary | ICD-10-CM

## 2015-10-01 DIAGNOSIS — I4891 Unspecified atrial fibrillation: Secondary | ICD-10-CM

## 2015-10-01 LAB — TYPE AND SCREEN
ABO/RH(D): O NEG
ANTIBODY SCREEN: NEGATIVE
UNIT DIVISION: 0
UNIT DIVISION: 0
UNIT DIVISION: 0
UNIT DIVISION: 0
Unit division: 0

## 2015-10-01 LAB — CBC WITH DIFFERENTIAL/PLATELET
BASOS ABS: 0 10*3/uL (ref 0.0–0.1)
BASOS PCT: 0 %
EOS PCT: 2 %
Eosinophils Absolute: 0.1 10*3/uL (ref 0.0–0.7)
HEMATOCRIT: 28.1 % — AB (ref 39.0–52.0)
Hemoglobin: 9.5 g/dL — ABNORMAL LOW (ref 13.0–17.0)
Lymphocytes Relative: 10 %
Lymphs Abs: 0.8 10*3/uL (ref 0.7–4.0)
MCH: 32.4 pg (ref 26.0–34.0)
MCHC: 33.8 g/dL (ref 30.0–36.0)
MCV: 95.9 fL (ref 78.0–100.0)
MONO ABS: 0.9 10*3/uL (ref 0.1–1.0)
Monocytes Relative: 11 %
NEUTROS ABS: 6.2 10*3/uL (ref 1.7–7.7)
Neutrophils Relative %: 77 %
PLATELETS: 60 10*3/uL — AB (ref 150–400)
RBC: 2.93 MIL/uL — AB (ref 4.22–5.81)
RDW: 19.5 % — AB (ref 11.5–15.5)
WBC: 8 10*3/uL (ref 4.0–10.5)

## 2015-10-01 LAB — COMPREHENSIVE METABOLIC PANEL
ALBUMIN: 2.6 g/dL — AB (ref 3.5–5.0)
ALT: 24 U/L (ref 17–63)
AST: 23 U/L (ref 15–41)
Alkaline Phosphatase: 70 U/L (ref 38–126)
Anion gap: 4 — ABNORMAL LOW (ref 5–15)
BUN: 26 mg/dL — AB (ref 6–20)
CHLORIDE: 104 mmol/L (ref 101–111)
CO2: 30 mmol/L (ref 22–32)
CREATININE: 1.11 mg/dL (ref 0.61–1.24)
Calcium: 7.6 mg/dL — ABNORMAL LOW (ref 8.9–10.3)
GFR calc Af Amer: >60 mL/min (ref >60)
GFR calc non Af Amer: 60 mL/min — ABNORMAL LOW (ref >60)
GLUCOSE: 84 mg/dL (ref 65–99)
POTASSIUM: 3.4 mmol/L — AB (ref 3.5–5.1)
Sodium: 138 mmol/L (ref 135–145)
Total Bilirubin: 1.7 mg/dL — ABNORMAL HIGH (ref 0.3–1.2)
Total Protein: 4.9 g/dL — ABNORMAL LOW (ref 6.5–8.1)

## 2015-10-01 LAB — LACTATE DEHYDROGENASE: LDH: 187 U/L (ref 98–192)

## 2015-10-01 MED ORDER — FUROSEMIDE 40 MG PO TABS
40.0000 mg | ORAL_TABLET | Freq: Every day | ORAL | Status: DC
  Administered 2015-10-02: 40 mg via ORAL
  Filled 2015-10-01: qty 1

## 2015-10-01 MED ORDER — OXYCODONE HCL 5 MG PO TABS
10.0000 mg | ORAL_TABLET | Freq: Four times a day (QID) | ORAL | Status: DC | PRN
  Administered 2015-10-02 (×2): 10 mg via ORAL
  Filled 2015-10-01 (×2): qty 2

## 2015-10-01 MED ORDER — OXYCODONE HCL 5 MG PO TABS
10.0000 mg | ORAL_TABLET | Freq: Once | ORAL | Status: AC
  Administered 2015-10-01: 10 mg via ORAL

## 2015-10-01 MED ORDER — OXYCODONE HCL 5 MG PO TABS: 10.0000 mg | ORAL_TABLET | Freq: Four times a day (QID) | ORAL | Status: DC

## 2015-10-01 NOTE — Procedures (Signed)
PreOperative Dx: Recurrent RIGHT pleural effusion Postoperative Dx: Recurrent RIGHT pleural effusion Procedure:   US guided RIGHT thoracentesis Radiologist:  Thornton Papas Anesthesia:  8 ml of 1% lidocaine Specimen:  1500 ml of yellow colored fluid EBL:   < 1 ml Complications: None

## 2015-10-01 NOTE — Progress Notes (Signed)
Thoracentesis complete no signs of distress. Thoracentesis complete no signs of distress.

## 2015-10-01 NOTE — Progress Notes (Signed)
Patient returned from thoracentesis. Patient is oriented to person, place, and time, but acting inappropriate. Family concerned. Vital signs stable. Dr. Sarajane Jews paged.

## 2015-10-01 NOTE — Progress Notes (Signed)
Subjective: Patient is S/P thoracentesis with 1.5 L being removed.  No testing of fluid ordered.  Patient reports pain is about the same.  During conversation, he is making unusual comments and statements.  His daughters report that he is confused today, more so now, but it was noted earlier today.    Nurse called radiology.  No pre-procedure medications were given.  I personally reviewed and went over radiographic studies with the patient.  The results are noted within this dictation.  Negative for new fractures.  Daughter points out that his right leg is more swollen than the left which is new.    Diffuse large B cell lymphoma ([place])   08/22/2015 - 08/25/2015 Hospital Admission Acute respiratory distress   08/22/2015 Imaging CTA chest- Right infrahilar lymph node versus central lung mass measuring 2.8 x 4.1 cm. Worsening mediastinal adenopathy and bilateral hilar adenopathy as described with the largest node over the subcarinal region measuring 2.7 cm by short axis...   08/24/2015 Pathology Results Diagnosis PLEURAL FLUID, RIGHT(SPECIMEN 1 OF 1 COLLECTED 08/24/15): ATYPICAL LYMPHOCYTES SUSPICIOUS FOR A LYMPHOPROLIFERATIVE PROCESS, SEE COMMENT. [name]   08/25/2015 Imaging CT abd/pelvis- Mild upper retroperitoneal (gastrohepatic ligament and right retrocrural) lymphadenopathy. Cirrhosis.   09/03/2015 PET scan Widespread metastatic adenopathy including the RIGHT cervical lymph nodes, LEFT axillary lymph nodes, mediastinal lymph, hilar lymph node, retroperitoneal lymph nodes and iliac lymph nodes and inguinal lymph nodes. Inguinal lymph nodes may be most....   09/11/2015 Pathology Results Diagnosis Lymph node, biopsy, right inguinal - DIFFUSE LARGE B CELL LYMPHOMA.   09/11/2015 Pathology Results Interpretation Tissue-Flow Cytometry - MONOCLONAL B-CELL POPULATION IDENTIFIED. Diagnosis Comment: The findings are consistent with non-Hodgkin B-cell lymphoma. (BNS:ecj 09/16/2015)   09/13/2015 - 09/18/2015  Hospital Admission COPD exacerbation   09/18/2015 Bone Marrow Biopsy Bone Marrow, Aspirate,Biopsy, and Clot, left iliac crest: Despite limited findings, the features are worrisome for minimal involvement by B cell lymphoproliferative process, particularly given the previous history of large B cell lymphoma.    09/21/2015 -  Chemotherapy Mini-R-CHOP   09/29/2015 -  Hospital Admission SOB, anemia, dehydration, recurrent pleural effusion (right)    Objective: Vital signs in last 24 hours: Temp:  [97.9 F (36.6 C)-99.6 F (37.6 C)] 99.6 F (37.6 C) (10/06 1437) Pulse Rate:  [57-99] 99 (10/06 1457) Resp:  [15-20] 20 (10/06 1457) BP: (88-114)/(44-70) 114/59 mmHg (10/06 1457) SpO2:  [91 %-100 %] 100 % (10/06 1457) Weight:  [199 lb 1.6 oz (90.311 kg)] 199 lb 1.6 oz (90.311 kg) (10/06 0617)  Intake/Output from previous day: 10/05 0800 - 10/06 0759 In: 5621 [P.O.:240; I.V.:2500; Blood:650] Out: 900 [Urine:900] Intake/Output this shift: Total I/O In: 480 [P.O.:480] Out: -   General appearance: alert, cooperative, fatigued and [place] in place for O2 deliver, but not properly placed in nares.  Mental status changes Extremities: right lower extremity edema 2+ pitting with warmth to palpation and tenderness in popliteal region.  Lab Results:   Recent Labs  09/30/15 0549 10/01/15 0605  WBC 6.1 8.0  HGB 7.7* 9.5*  HCT 21.9* 28.1*  PLT 64* 60*   BMET  Recent Labs  09/30/15 0549 10/01/15 0605  NA 139 138  K 3.7 3.4*  CL 105 104  CO2 31 30  GLUCOSE 81 84  BUN 34* 26*  CREATININE 0.93 1.11  CALCIUM 7.6* 7.6*    Studies/Results: Dg Chest 1 View  10/01/2015   CLINICAL DATA:  Status post right thoracentesis  EXAM: CHEST  1 VIEW  COMPARISON:  09/29/2015  FINDINGS: There is been significant reduction in the right-sided pleural effusion. Only minimal fluid remains. No pneumothorax is seen. A left chest wall port is again noted and stable. The cardiac shadow is stable. The left lung remains  clear.  IMPRESSION: No pneumothorax following thoracentesis on the right.   Electronically Signed   By: Inez Catalina M.D.   On: 10/01/2015 14:27   Dg Lumbar Spine Complete  09/30/2015   CLINICAL DATA:  Nonspecific low back pain and stiffness with radiation to lower extremity for several days. History of diffuse large B-cell lymphoma and rheumatoid arthritis.  EXAM: LUMBAR SPINE - COMPLETE 4+ VIEW  COMPARISON:  CT abdomen pelvis - 08/25/2015 ; 07/05/2013  FINDINGS: There are 5 non rib-bearing lumbar type vertebral bodies.  There is a mild scoliotic curvature of the thoracolumbar spine with dominant caudal component convex to the left. Post L4-L5 paraspinal fusion without definite evidence of hardware failure or loosening.  Unchanged mild (under 25%) compression deformities involving the inferior endplate of the B14 vertebral body as well as the superior endplates of the L1 and L2 vertebral bodies, grossly unchanged since the 06/2013 abdominal CT. Remaining lumbar vertebral body heights are preserved.  Re- demonstrated moderate DDD of L3-L4 and L5-S1 with disc space height loss, endplate irregularity and sclerosis.  Calcified atherosclerotic plaque within the abdominal aorta.  Limited visualization of the bilateral SI joints is normal. Moderate colonic stool burden without evidence of enteric obstruction. Post cholecystectomy.  IMPRESSION: 1. No acute findings. 2. Post L4-L5 paraspinal fusion without evidence of hardware failure or loosening. 3. Moderate DDD of L3-L4 and L5-S1.   Electronically Signed   By: Sandi Mariscal M.D.   On: 09/30/2015 20:46    Medications: I have reviewed the patient's current medications.  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 Thrombocytopenia secondary to chemotherapy.  Neulasta support given following treatment. Anemia, improved S/P 5 units of PRBCs (09/29/2015- 09/30/2015) Right pleural effusion, malignant- S/P thoracentesis today (10/01/2015) with relief of  1.5 L. Back pain- slightly improved on patient's on account Mental status changes- more confusion, etiology unclear.  I will change Oxy IR to every 6 hours (instead of every 4).   Right LE edema- suspicious for DVT, [place] of right LE ordered STAT. A-fib- previously on Xarelto loading dose.  Held since 09/29/2015 in preparation for therapeutic thoracentesis on 10/01/2015.  Full dose Lovenox given on 09/30/2015 at 1.5 mg/kg in AM.  Plans to restart Xarelto at 15 mg BID x 21 days tomorrow AM.   Hypocalcemia- corrected for hypoalbuminemia results in a Ca++ of 8.7. Will discuss case further with Dr. Whitney Muse.  Further recommendations will follow. Discharge today is not recommended at this time.  Hopefully tomorrow due to new mental status changes.  Patient and plan discussed with Dr. Ancil Linsey and she is in agreement with the aforementioned.     LOS: 2 days    KEFALAS,THOMAS 10/01/2015  Pleural effusion should resolve with additional therapy. Currently, breathing is improved s/p thoracentesis.I suspect his confusion is secondary to pain medication and changes will be made as above.  Not capable of d/c today. Will re-evaluate tomorrow.  CBC will have to be closely monitored when anticoagulation restarted. Acute on chronic back pain, slightly improved. Plan as detailed. Donald Pore MD

## 2015-10-01 NOTE — Progress Notes (Signed)
Patient returned from thoracentesis. Dressing clean dry and intact.

## 2015-10-01 NOTE — Progress Notes (Signed)
PROGRESS NOTE  [name] [id number]:[id number] DOB: [birth year] DOA: 09/29/2015 PCP: [name], MD  Summary: 81 year old patient with history of B-cell lymphoma who was recently discharged from the hospital after being treated for COPD exacerbation and recurrent right-sided pleural effusion status post thoracentesis. He followed up with oncology earlier last week and underwent his first chemotherapy. His daughter reports that since 10/2, he has been complaining of worsening lower back pain.  He came back to the oncology office 10/4 for follow-up complaining of progressive shortness of breath. Workup indicated a hemoglobin of 5.7 down from 11.8 on 9/23. He was also found to have a recurrent right-sided pleural effusion. Patient was admitted by the oncology service for transfusion and possible thoracentesis.  Assessment/Plan: 1. Anemia, improved s/p transfusion. Secondary to chemotherapy as well as diminished bone marrow reserve in the setting of marrow involvement with lymphoma. Patient was transfused 3 units of PRBCs on 10/4 and 2 additional units on 10/5.  Hgb 9.5 today.  2. Thrombocytopenia. Likely related to underlying malignancy. Continue to monitor. No evidence of bleeding at this time. 3. Recurrent right-sided malignant pleural effusion. S/p thoracentesis today with marked reduction on CXR. This has been aspirated twice in the past.  4. R>L LE edema. H/o LE edema.  5. Elevated total bilirubin, improved, etiology unclear 6. Atrial fibrillation. Currently rate controlled. On diltiazem and digoxin.  Anticoagulation per oncology. 7. Chronic diastolic congestive heart failure. Has LE edema but pulmonary status stable. Weight stable compared to 9/19.  8. COPD . no evidence of wheezing at this time. Continue bronchodilators as needed. 9. Chronic respiratory failure with hypoxia. Chronically on 2 L of oxygen appears to be new baseline. 10. Acute on chronic lower back pain. Plain films of  lumbar spine unremarkable.  11. Diffuse large B-cell lymphoma. Further chemotherapy per oncology. Continue allopurinol for prevention of tumor lysis syndrome. 12. Obesity    Overall improved; Hgb up appropriately  Follow CBC, check in AM  CXR with small pleural effusion, consider referral for outpatient PleurX catheter.  F/u RLE venous doppler  Code Status: Full  DVT prophylaxis: SCDs Family Communication: daughter at bedside Disposition Plan: home, possibly 10/7  Murray Hodgkins, MD  [facility]  Pager [phone] If 7PM-7AM, please contact night-coverage at [email or web address], password [facility] 10/01/2015, 8:04 AM  LOS: 2 days   Consultants:    Procedures:   3 units of PRBCs on 10/4 and 2 additional units on 10/5  10/6 Right thoracentesis: 1500 mL  Antibiotics:    HPI/Subjective: Chronic back pain. Breathing ok. Eating fine.   RN reported confusion.   Objective: Filed Vitals:   09/30/15 1638 09/30/15 1848 09/30/15 2123 10/01/15 0617  BP: 88/53 98/48 108/52 114/57  Pulse: 79 97 78 85  Temp: 97.9 F (36.6 C) 98.2 F (36.8 C) 98.6 F (37 C) 98.7 F (37.1 C)  TempSrc: Oral Oral Oral Oral  Resp: 20 18 20 15   Height:      Weight:    90.311 kg (199 lb 1.6 oz)  SpO2: 100% 100% 94% 94%    Intake/Output Summary (Last 24 hours) at 10/01/15 0804 Last data filed at 10/01/15 0624  Gross per 24 hour  Intake   3390 ml  Output    900 ml  Net   2490 ml     Filed Weights   09/29/15 1330 10/01/15 0617  Weight: 90.719 kg (200 lb) 90.311 kg (199 lb 1.6 oz)    Exam:    VSS, afebrile, not hypoxic General:  Appears comfortable, calm. Eyes: PERRL, normal lids, irises ENT: grossly normal hearing, lips, tongue Cardiovascular: Regular rate and rhythm, no murmur, rub or gallop. No lower extremity edema. Telemetry: atrial fibrillation Respiratory: Clear to auscultation bilaterally, no wheezes, rales or rhonchi. Normal respiratory effort. Musculoskeletal: grossly normal  tone bilateral upper and lower extremities Psychiatric: grossly normal mood and affect, speech fluent and appropriate. Oriented to self, location, month, year, president, reason for hospitalization Neurologic: no pronator drift, CN appear intact, has eccentric left pupil (old)  New data reviewed:  BMP unremarkable, LFTs improving.  Hgb improved 9.5.   Plts stable 64  CXR shows marked improvement in Right pleural effusion  Pertinent data since admission:      Pending data:    Scheduled Meds: . allopurinol  300 mg Oral Daily  . antiseptic oral rinse  7 mL Mouth Rinse BID  . digoxin  125 mcg Oral Daily  . diltiazem  180 mg Oral Daily  . feeding supplement (ENSURE ENLIVE)  237 mL Oral BID BM  . fluticasone  2 spray Each Nare Daily  . folic acid  1 mg Oral Daily  . furosemide  20 mg Intravenous BID  . gabapentin  600 mg Oral BID  . loratadine  10 mg Oral Daily  . oxyCODONE  10 mg Oral Q4H  . pantoprazole  40 mg Oral Daily  . tamsulosin  0.4 mg Oral QHS   Continuous Infusions:   Principal Problem:   Anemia Active Problems:   Essential hypertension, benign   COPD (chronic obstructive pulmonary disease) (HCC)   Thrombocytopenia (HCC)   Acute on chronic diastolic CHF (congestive heart failure) (HCC)   Diffuse large B cell lymphoma (HCC)   Chronic a-fib (HCC)   BPH (benign prostatic hyperplasia)   Recurrent right pleural effusion   Chronic respiratory failure ([place])   Time spent 35 minutes, >50% in counseling and coordination of care  By signing my name below, I, Rosalie Doctor attest that this documentation has been prepared under the direction and in the presence of Murray Hodgkins, MD Electronically signed: Rosalie Doctor, Scribe.  10/01/2015  I personally performed the services described in this documentation. All medical record entries made by the scribe were at my direction. I have reviewed the chart and agree that the record reflects my personal  performance and is accurate and complete. Murray Hodgkins, MD

## 2015-10-01 NOTE — Progress Notes (Signed)
Dressing at thoracentesis site clean, dry and intact.

## 2015-10-02 ENCOUNTER — Other Ambulatory Visit (HOSPITAL_COMMUNITY): Payer: Self-pay | Admitting: Oncology

## 2015-10-02 LAB — CBC
HEMATOCRIT: 28 % — AB (ref 39.0–52.0)
HEMOGLOBIN: 9.4 g/dL — AB (ref 13.0–17.0)
MCH: 33.2 pg (ref 26.0–34.0)
MCHC: 33.6 g/dL (ref 30.0–36.0)
MCV: 98.9 fL (ref 78.0–100.0)
Platelets: 43 10*3/uL — ABNORMAL LOW (ref 150–400)
RBC: 2.83 MIL/uL — AB (ref 4.22–5.81)
RDW: 20 % — ABNORMAL HIGH (ref 11.5–15.5)
WBC: 6.3 10*3/uL (ref 4.0–10.5)

## 2015-10-02 MED ORDER — OXYCODONE HCL 10 MG PO TABS: 10.0000 mg | ORAL_TABLET | Freq: Four times a day (QID) | ORAL | Status: DC | PRN

## 2015-10-02 MED ORDER — FUROSEMIDE 40 MG PO TABS: 40.0000 mg | ORAL_TABLET | Freq: Every day | ORAL | Status: DC

## 2015-10-02 MED ORDER — HEPARIN SOD (PORK) LOCK FLUSH 100 UNIT/ML IV SOLN
500.0000 [IU] | INTRAVENOUS | Status: AC | PRN
  Administered 2015-10-02: 500 [IU]
  Filled 2015-10-02: qty 5

## 2015-10-02 MED ORDER — DILTIAZEM HCL ER COATED BEADS 240 MG PO CP24: 240.0000 mg | ORAL_CAPSULE | Freq: Every day | ORAL | Status: DC

## 2015-10-02 NOTE — Care Management Important Message (Signed)
Important Message  Patient Details  Name: [name] MRN: [id number] Date of Birth: August 17, 1931   Medicare Important Message Given:  Yes-second notification given    Joylene Draft, RN 10/02/2015, 10:29 AM

## 2015-10-02 NOTE — Progress Notes (Signed)
Patient alert and oriented, independent, VSS, pt. Tolerating diet well. No complaints of pain or nausea. Pt. Had venous chest IV port removed. Pt. Had prescriptions given. Pt. Voiced understanding of discharge instructions with no further questions. Pt. Discharged via wheelchair with RN, Jacqlyn Larsen.

## 2015-10-02 NOTE — Progress Notes (Signed)
Subjective: Patient in bed eating breakfast.  Pain is improved.  Breathing is back to baseline.  He is ready to go home.    I personally reviewed and went over laboratory results with the patient.  The results are noted within this dictation.  I personally reviewed and went over radiographic studies with the patient.  The results are noted within this dictation.    Objective: Vital signs in last 24 hours: Temp:  [98.5 F (36.9 C)] 98.5 F (36.9 C) (10/07 9242) Pulse Rate:  [80] 80 (10/07 0638) Resp:  [17-20] 20 (10/07 0638) BP: (110-112)/(50-65) 110/50 mmHg (10/07 0638) SpO2:  [100 %] 100 % (10/07 6834) Weight:  [195 lb 1.6 oz (88.497 kg)] 195 lb 1.6 oz (88.497 kg) (10/07 1962)  Intake/Output from previous day: 10/06 0800 - 10/07 0759 In: 720 [P.O.:720] Out: 400 [Urine:400] Intake/Output this shift:    General appearance: alert, cooperative and no distress  Lab Results:   Recent Labs  10/01/15 0605 10/02/15 0615  WBC 8.0 6.3  HGB 9.5* 9.4*  HCT 28.1* 28.0*  PLT 60* 43*   BMET  Recent Labs  09/30/15 0549 10/01/15 0605  NA 139 138  K 3.7 3.4*  CL 105 104  CO2 31 30  GLUCOSE 81 84  BUN 34* 26*  CREATININE 0.93 1.11  CALCIUM 7.6* 7.6*    Studies/Results: Dg Chest 1 View  10/01/2015   CLINICAL DATA:  Status post right thoracentesis  EXAM: CHEST  1 VIEW  COMPARISON:  09/29/2015  FINDINGS: There is been significant reduction in the right-sided pleural effusion. Only minimal fluid remains. No pneumothorax is seen. A left chest wall port is again noted and stable. The cardiac shadow is stable. The left lung remains clear.  IMPRESSION: No pneumothorax following thoracentesis on the right.   Electronically Signed   By: Inez Catalina M.D.   On: 10/01/2015 14:27   US Venous Img Lower Unilateral Right  10/01/2015   CLINICAL DATA:  Right leg swelling x2 days. pain. lymphoma. previous tobacco abuse.  EXAM: RIGHT LOWER EXTREMITY VENOUS DOPPLER ULTRASOUND  TECHNIQUE:  Gray-scale sonography with compression, as well as color and duplex ultrasound, were performed to evaluate the deep venous system from the level of the common femoral vein through the popliteal and proximal calf veins.  COMPARISON:  None  FINDINGS: Normal compressibility of the common femoral, superficial femoral, and popliteal veins, as well as the proximal calf veins. No filling defects to suggest DVT on grayscale or color Doppler imaging. Doppler waveforms show normal direction of venous flow, normal respiratory phasicity and response to augmentation. Subcutaneous edema in the calf. Cystic right inguinal adenopathy partially visualized. Survey views of the contralateral common femoral vein are unremarkable.  IMPRESSION: 1. No evidence of lower extremity deep vein thrombosis, RIGHT.   Electronically Signed   By: Lucrezia Europe M.D.   On: 10/01/2015 16:40   US Thoracentesis Asp Pleural Space W/img Guide  10/01/2015   CLINICAL DATA:  Recurrent RIGHT pleural effusion  EXAM: ULTRASOUND GUIDED DIAGNOSTIC AND THERAPEUTIC THORACENTESIS  COMPARISON:  09/15/2015  PROCEDURE: Procedure, benefits, and risks of procedure were discussed with patient.  Written informed consent for procedure was obtained.  Time out protocol followed.  Pleural effusion localized by ultrasound at the posterior RIGHT hemithorax.  Skin prepped and draped in usual sterile fashion.  Skin and soft tissues anesthetized with 8 mL of 1% lidocaine.  8 French thoracentesis catheter placed into the RIGHT pleural space.  1500 mL of yellow RIGHT  pleural fluid aspirated by syringe pump.  Procedure tolerated well by patient without immediate complication.  COMPLICATIONS: None  FINDINGS: A total of approximately 1500 mL of RIGHT pleural fluid was removed.  A fluid sample of 180 mL wassent for laboratory analysis.  IMPRESSION: Successful ultrasound guided RIGHT thoracentesis yielding 1500 mL of RIGHT pleural fluid.   Electronically Signed   By: Lavonia Dana M.D.    On: 10/01/2015 15:27    Medications: I have reviewed the patient's current medications.  Assessment/Plan:  Anemia- improved.  S/P 5 units of PRBCs Thrombocytopenia- progressive.  Secondary to chemotherapy.  Suspect continued drop over next 24-72 hours with natural improvement.  No need for platelet transfusion.  This is not a reason to prevent D/C today. Pain- improved with scheduled pain medication Mental status change- much improved with a decrease in frequency of scheduled pain medication. A-Fib- previously on Xarelto.  With platelet nadir impending, will hold off on restarting Xarelto until seen in clinic in follow-up beginning on next week.  Suspect we will restart Xarelto next week and will restart loading dose of 15 mg BID. Right pleural effusion- malignant, secondary to NHL.  S/P right thoracentesis.  Should resolve in the next few weeks with response to therapy. NHL- S/P 1 cycle of mini-R-CHOP with Neulasta support.  Patient and plan discussed with Dr. Ancil Linsey and she is in agreement with the aforementioned.     LOS: 3 days    KEFALAS,THOMAS 10/02/2015   AS above. CBC will be followed weekly. Will repeat early next week. Will hold on Xarelto until we see how his counts hold. Confusion markedly improved. Breathing is back to prior baseline. Back pain improved to stable.   Donald Pore MD

## 2015-10-02 NOTE — Progress Notes (Signed)
Discussed Xarelto Rx with Dr Whitney Muse and Caroleen Hamman, PA.  Given low platelets Heme-Onc decided to hold Xarelto for now.  Plan to recheck labs next week in clinic.    Juel Burrow, PharmD

## 2015-10-02 NOTE — Care Management Note (Signed)
Case Management Note  Patient Details  Name: [name] MRN: [id number] Date of Birth: 26-Dec-1931  Subjective/Objective:                    Action/Plan:   Expected Discharge Date:  10/02/15               Expected Discharge Plan:  Home/Self Care  In-House Referral:  NA  Discharge planning Services  CM Consult  Post Acute Care Choice:  Durable Medical Equipment Choice offered to:  Patient  DME Arranged:  Nebulizer machine DME Agency:  [place]:    [facility] Agency:     Status of Service:  Completed, signed off  Medicare Important Message Given:  Yes-second notification given Date Medicare IM Given:    Medicare IM give by:    Date Additional Medicare IM Given:    Additional Medicare Important Message give by:     If discussed at [place] of Stay Meetings, dates discussed:    Additional Comments: Anticipate discharge within 24 hours. Pts neb machine has been ordered from [facility] (per pts choice) and will be delivered to pts home after discharge. Pt and pts nurse aware of discharge arrangements. Christinia Gully [place], RN 10/02/2015, 10:29 AM

## 2015-10-02 NOTE — Discharge Summary (Signed)
Physician Discharge Summary  [name] GYF:[id number] DOB: 10/15/1931 DOA: 09/29/2015  PCP: [name], MD  Admit date: 09/29/2015 Discharge date: 10/02/2015  Time spent: 35 minutes  Recommendations for Outpatient Follow-up:  1. Follow up with PCP in 1-2 weeks. 2. Follow up with oncology on 10/10 for repeat blood work .  3. Hold off on starting anticoagulation until cleared by oncology.   Discharge Diagnoses:  Principal Problem:   Anemia Active Problems:   Essential hypertension, benign   COPD (chronic obstructive pulmonary disease) (HCC)   Thrombocytopenia (HCC)   Acute on chronic diastolic CHF (congestive heart failure) (HCC)   Diffuse large B cell lymphoma (HCC)   Chronic a-fib (HCC)   BPH (benign prostatic hyperplasia)   Recurrent right pleural effusion   Chronic respiratory failure (HCC)   Malignant pleural effusion   Discharge Condition: Improved  Diet recommendation: Low-sodium   Filed Weights   09/29/15 1330 10/01/15 0617 10/02/15 0638  Weight: 90.719 kg (200 lb) 90.311 kg (199 lb 1.6 oz) 88.497 kg (195 lb 1.6 oz)    History of present illness:  This is an 81 year old male with history of B-cell lymphoma who was recently discharged from the hospital after being treated for COPD exacerbation and recurrent right-sided pleural effusion status post thoracentesis. He followed up with oncology earlier last week and underwent his first chemotherapy. His daughter reports that since 10/2, he has been complaining of worsening lower back pain. He came back to the oncology office 10/4 for follow-up complaining of progressive shortness of breath. Workup indicated a hemoglobin of 5.7 down from 11.8 on 9/23. He was also found to have a recurrent right-sided pleural effusion. Patient was admitted by the oncology service for transfusion and possible thoracentesis.  Hospital Course:  81 y/o male with a hx of B-cell lymphoma presented with anemia secondary to chemotherapy  as well as diminished bone marrow reserve in the setting of marrow involvement with lymphoma. He  was transfused 3 units of PRBCs on 10/4 and 2 additional units on 10/5 with significant improvement of his anemia. Oncology consulted and recommends the patient be discharged with follow up on 10/10 for repeat blood work. He does not have any evidence of bleeding.    Recurrent right-sided malignant pleural effusion seen on CXR (has been aspirated twice in the past). Thoracentesis performed 10/6 removed 1.5L of fluid. There was significant reduction on repeat film. Patient feels significantly improved and is no longer SOB or hypoxic. Consider temporary pleurx if pleural effusion continues to occur despite treatment of lymphoma.   1. Thrombocytopenia. Likely related to underlying malignancy and recent chemotherapy. No evidence of bleeding. Will be followed by oncology. 2. R>L LE edema. No evidence of DVT on venous dopplers. Increase Lasix.  3. Elevated total bilirubin, improved, etiology unclear 4. Atrial fibrillation. Rate controlled. On diltiazem and digoxin. Anticoagulation per oncology. 5. Chronic diastolic congestive heart failure, compensated. 6. COPD, stable. No evidence of wheezing at this time.  7. Chronic respiratory failure with hypoxia, stable on 2L baseline O2 as needed. 8. Acute on chronic lower back pain. Plain films of lumbar spine unremarkable. Pain improved with pain medications.  9. Diffuse large B-cell lymphoma. Further chemotherapy per oncology.  10. Obesity    Consultants:  Oncology  Procedures:  3 units of PRBCs on 10/4 and 2 additional units on 10/5  10/6 Right thoracentesis: 1500 mL  Discharge Exam: Filed Vitals:   10/02/15 0638  BP: 110/50  Pulse: 80  Temp: 98.5 F (36.9 C)  Resp: 20     General: NAD, looks comfortable  Cardiovascular: RRR, S1, S2   Respiratory: clear bilaterally, No wheezing, rales or rhonchi  Abdomen: soft, non tender, no  distention , bowel sounds normal  Musculoskeletal: 1+ edema on BLE.   Discharge Instructions   Discharge Instructions    Diet - low sodium heart healthy    Complete by:  As directed      Increase activity slowly    Complete by:  As directed           Current Discharge Medication List    START taking these medications   Details  oxyCODONE 10 MG TABS Take 1 tablet (10 mg total) by mouth every 6 (six) hours as needed for severe pain. Qty: 30 tablet, Refills: 0      CONTINUE these medications which have CHANGED   Details  diltiazem (CARDIZEM CD) 240 MG 24 hr capsule Take 1 capsule (240 mg total) by mouth daily. Qty: 30 capsule, Refills: 1    furosemide (LASIX) 40 MG tablet Take 1 tablet (40 mg total) by mouth daily. Take daily for prevention of fluid buildup. Qty: 30 tablet      CONTINUE these medications which have NOT CHANGED   Details  allopurinol (ZYLOPRIM) 300 MG tablet Take 1 tablet (300 mg total) by mouth daily. Qty: 30 tablet, Refills: 3   Associated Diagnoses: Diffuse large B cell lymphoma (HCC)    cetirizine (ZYRTEC) 10 MG tablet Take 10 mg by mouth daily.      DIGOX 125 MCG tablet TAKE ONE TABLET BY MOUTH DAILY Qty: 30 tablet, Refills: 6    ferrous sulfate 325 (65 FE) MG tablet Take 325 mg by mouth daily.    fluticasone (FLONASE) 50 MCG/ACT nasal spray Place 2 sprays into both nostrils daily.  Refills: 0    folic acid (FOLVITE) 1 MG tablet Take 1 mg by mouth daily.     gabapentin (NEURONTIN) 300 MG capsule Take 2 capsules (600 mg total) by mouth 2 (two) times daily. Qty: 30 capsule, Refills: 0    guaiFENesin (MUCINEX) 600 MG 12 hr tablet Take 1 tablet (600 mg total) by mouth 2 (two) times daily. Qty: 20 tablet, Refills: 0    ipratropium-albuterol (DUONEB) 0.5-2.5 (3) MG/3ML SOLN Take 3 mLs by nebulization every 6 (six) hours as needed. Qty: 360 mL, Refills: 1    ketoconazole (NIZORAL) 2 % shampoo Apply 1 application topically 3 (three) times a week.      lidocaine-prilocaine (EMLA) cream Apply a quarter size amount to port site 1 hour prior to chemo. Do not rub in. Cover with plastic wrap. Qty: 30 g, Refills: 3   Associated Diagnoses: Diffuse large B cell lymphoma (HCC)    Omega-3 Fatty Acids (FISH OIL) 1000 MG CAPS Take 2 capsules by mouth 2 (two) times daily.     omeprazole (PRILOSEC) 40 MG capsule Take 40 mg by mouth daily.    potassium chloride SA (K-DUR,KLOR-CON) 20 MEQ tablet Take 0.5 tablets (10 mEq total) by mouth daily. Starting 08/27/15.    tamsulosin (FLOMAX) 0.4 MG CAPS capsule Take 1 capsule (0.4 mg total) by mouth at bedtime. For prostate treatment. Qty: 30 capsule, Refills: 3    Vitamin D, Ergocalciferol, (DRISDOL) 50000 UNITS CAPS capsule Take 1 capsule by mouth once a week. friday Refills: 4    albuterol (PROAIR HFA) 108 (90 BASE) MCG/ACT inhaler Inhale 2 puffs into the lungs every 6 (six) hours as needed. Shortness of Breath Qty: 1  Inhaler, Refills: 3    ondansetron (ZOFRAN) 8 MG tablet Take 1 tablet every 8 hours as needed for nausea/vomiting. Qty: 30 tablet, Refills: 3   Associated Diagnoses: Diffuse large B cell lymphoma (HCC)    prochlorperazine (COMPAZINE) 10 MG tablet Take 1 tablet (10 mg total) by mouth every 6 (six) hours as needed (Nausea or vomiting). Qty: 30 tablet, Refills: 3   Associated Diagnoses: Diffuse large B cell lymphoma (HCC)    tadalafil (CIALIS) 5 MG tablet Take 5 mg by mouth daily as needed for erectile dysfunction.       STOP taking these medications     aspirin EC 81 MG EC tablet      methotrexate (RHEUMATREX) 2.5 MG tablet      oxyCODONE-acetaminophen (PERCOCET) 10-325 MG per tablet      predniSONE (DELTASONE) 20 MG tablet      XARELTO 15 MG TABS tablet      HYDROcodone-acetaminophen (NORCO) 10-325 MG per tablet        Allergies  Allergen Reactions  . Cymbalta [Duloxetine Hcl] Other (See Comments)    Confusion   . Procaine Hcl Hives and Other (See Comments)     NOVOCAINE: Sweating, Confusion, Not in right state of mind.  [name]    unknown      The results of significant diagnostics from this hospitalization (including imaging, microbiology, ancillary and laboratory) are listed below for reference.    Significant Diagnostic Studies: Dg Chest 1 View  10/01/2015   CLINICAL DATA:  Status post right thoracentesis  EXAM: CHEST  1 VIEW  COMPARISON:  09/29/2015  FINDINGS: There is been significant reduction in the right-sided pleural effusion. Only minimal fluid remains. No pneumothorax is seen. A left chest wall port is again noted and stable. The cardiac shadow is stable. The left lung remains clear.  IMPRESSION: No pneumothorax following thoracentesis on the right.   Electronically Signed   By: Inez Catalina M.D.   On: 10/01/2015 14:27   Dg Chest 1 View  09/15/2015   CLINICAL DATA:  Status post thoracentesis  EXAM: CHEST  1 VIEW  COMPARISON:  09/13/15  FINDINGS: There is moderate cardiac enlargement. Left chest wall port a catheter is noted with tip in the projection of the SVC. Cardiac enlargement is noted. There has been decrease in volume of right pleural effusion. No significant pneumothorax identified. Similar appearance of diffuse pulmonary edema.  IMPRESSION: 1. No pneumothorax after right thoracentesis.   Electronically Signed   By: Kerby Moors M.D.   On: 09/15/2015 14:12   Chest 2 View  09/09/2015   CLINICAL DATA:  Shortness of breath for a long time. History of asthma, hypertension an widespread lung cancer.  EXAM: CHEST  2 VIEW  COMPARISON:  Radiographs 08/24/2015.  PET-CT 09/03/2015.  FINDINGS: The heart size and mediastinal contours are stable. The patient has known mediastinal and hilar adenopathy. Right pleural effusion and dependent right basilar pulmonary opacity are not significantly changed from recent PET-CT. The interstitial markings are diffusely coarsened, corresponding with emphysema on CT. The  underlying pulmonary nodularity is not well visualized. Anterior wedging in the lower thoracic spine appears grossly stable.  IMPRESSION: Compared with PET-CT of 6 days ago, no acute findings demonstrated. Persistent right pleural effusion, right basilar opacity and emphysema noted. Known metastatic disease better demonstrated on PET-CT.   Electronically Signed   By: Richardean Sale M.D.   On: 09/09/2015 09:58   Dg Lumbar Spine Complete  09/30/2015   CLINICAL DATA:  Nonspecific low back pain and stiffness with radiation to lower extremity for several days. History of diffuse large B-cell lymphoma and rheumatoid arthritis.  EXAM: LUMBAR SPINE - COMPLETE 4+ VIEW  COMPARISON:  CT abdomen pelvis - 08/25/2015 ; 07/05/2013  FINDINGS: There are 5 non rib-bearing lumbar type vertebral bodies.  There is a mild scoliotic curvature of the thoracolumbar spine with dominant caudal component convex to the left. Post L4-L5 paraspinal fusion without definite evidence of hardware failure or loosening.  Unchanged mild (under 25%) compression deformities involving the inferior endplate of the C12 vertebral body as well as the superior endplates of the L1 and L2 vertebral bodies, grossly unchanged since the 06/2013 abdominal CT. Remaining lumbar vertebral body heights are preserved.  Re- demonstrated moderate DDD of L3-L4 and L5-S1 with disc space height loss, endplate irregularity and sclerosis.  Calcified atherosclerotic plaque within the abdominal aorta.  Limited visualization of the bilateral SI joints is normal. Moderate colonic stool burden without evidence of enteric obstruction. Post cholecystectomy.  IMPRESSION: 1. No acute findings. 2. Post L4-L5 paraspinal fusion without evidence of hardware failure or loosening. 3. Moderate DDD of L3-L4 and L5-S1.   Electronically Signed   By: Sandi Mariscal M.D.   On: 09/30/2015 20:46   Nm Cardiac Muga Rest  09/17/2015   CLINICAL DATA:  Hodgkin's lymphoma. Patient to receive cardiotoxic  chemotherapy.  EXAM: NUCLEAR MEDICINE CARDIAC BLOOD POOL IMAGING (MUGA)  TECHNIQUE: Cardiac multi-gated acquisition was performed at rest following intravenous injection of Tc-48mlabeled red blood cells.  RADIOPHARMACEUTICALS:  26.0 mCi Tc-965mDP in-vitro labeled red blood cells IV  COMPARISON:  None.  FINDINGS: The left ventricular ejection fraction is equal to 65.9%. There is left ventricular normal wall motion.  IMPRESSION: 1. Left ventricular ejection fraction equals 65.9%.   Electronically Signed   By: TaKerby Moors.D.   On: 09/17/2015 13:37   Nm Pet Image Initial (pi) Skull Base To Thigh  09/03/2015   CLINICAL DATA:  Initial treatment strategy for lung carcinoma. RIGHT lobe mass on CT. Adenopathy.  EXAM: NUCLEAR MEDICINE PET SKULL BASE TO THIGH  TECHNIQUE: 10.2 mCi F-18 FDG was injected intravenously. Full-ring PET imaging was performed from the skull base to thigh after the radiotracer. CT data was obtained and used for attenuation correction and anatomic localization.  FASTING BLOOD GLUCOSE:  Value: 87 mg/dl  COMPARISON:  CT 08/22/2015  FINDINGS: NECK  Several intensely hypermetabolic RIGHT level 2 and level 3 lymph nodes are minimally enlarged but intensely metabolic with SUV max equal 8.5.  CHEST  Intensely hypermetabolic prevascular and paratracheal, subcarinal bilateral hilar lymph nodes. Example subcarinal lymph node with SUV max equal 33.  Hypermetabolic LEFT axillary lymph nodes and RIGHT supraclavicular nodes are small but intensely metabolic.  There is hypermetabolic peripheral pleural thickening as well as discrete nodules within both lungs. Hypermetabolic nodule in the LEFT upper lobe measures 6 mm on image 81, series 4. Hypermetabolic RIGHT infrahilar mass measures 2.5 cm on image 84 intense metabolic activity.  There is a moderate RIGHT pleural effusion.  ABDOMEN/PELVIS  Hypermetabolic activity through the gastric antrum in the pyloric region (image 116 in fused data set )with SUV max  equals 6.4.  There is hypermetabolic activity associated small retroperitoneal and pelvic lymph nodes. These lymph nodes are small by intensely hypermetabolic. For example LEFT external iliac lymph node on image 172 measures only 7 mm with SUV max equal 13. There are bilateral hypermetabolic inguinal lymph nodes.  SKELETON  No focal hypermetabolic activity to suggest skeletal metastasis.  IMPRESSION: 1. Widespread metastatic adenopathy including the RIGHT cervical lymph nodes, LEFT axillary lymph nodes, mediastinal lymph, hilar lymph node, retroperitoneal lymph nodes and iliac lymph nodes and inguinal lymph nodes. Inguinal lymph nodes may be most accessible for biopsy. 2. Hypermetabolic pleural thickening and discrete nodules within the lungs consistent with metastatic disease. 3. Potential hypermetabolic RIGHT infrahilar mass versus adenopathy. 4. Hypermetabolic thickening through the gastric antrum / pyloric region. Consider further investigation if a primary cannot be determined. 5. Moderate RIGHT effusion.   Electronically Signed   By: Suzy Bouchard M.D.   On: 09/03/2015 16:34   US Venous Img Lower Unilateral Right  10/01/2015   CLINICAL DATA:  Right leg swelling x2 days. pain. lymphoma. previous tobacco abuse.  EXAM: RIGHT LOWER EXTREMITY VENOUS DOPPLER ULTRASOUND  TECHNIQUE: Gray-scale sonography with compression, as well as color and duplex ultrasound, were performed to evaluate the deep venous system from the level of the common femoral vein through the popliteal and proximal calf veins.  COMPARISON:  None  FINDINGS: Normal compressibility of the common femoral, superficial femoral, and popliteal veins, as well as the proximal calf veins. No filling defects to suggest DVT on grayscale or color Doppler imaging. Doppler waveforms show normal direction of venous flow, normal respiratory phasicity and response to augmentation. Subcutaneous edema in the calf. Cystic right inguinal adenopathy partially  visualized. Survey views of the contralateral common femoral vein are unremarkable.  IMPRESSION: 1. No evidence of lower extremity deep vein thrombosis, RIGHT.   Electronically Signed   By: Lucrezia Europe M.D.   On: 10/01/2015 16:40   Ct Biopsy  09/18/2015   INDICATION: History of non-Hodgkin's lymphoma. Please perform CT-guided biopsy for tissue diagnostic purposes.  EXAM: CT-GUIDED BONE MARROW BIOPSY AND ASPIRATION  MEDICATIONS: Fentanyl 50 mcg IV; Versed 3 mg IV  ANESTHESIA/SEDATION: Sedation Time  10 minutes  CONTRAST:  None  COMPLICATIONS: None immediate.  PROCEDURE: Informed consent was obtained from the patient following an explanation of the procedure, risks, benefits and alternatives. The patient understands, agrees and consents for the procedure. All questions were addressed. A time out was performed prior to the initiation of the procedure. The patient was positioned prone and non-contrast localization CT was performed of the pelvis to demonstrate the iliac marrow spaces. The operative site was prepped and draped in the usual sterile fashion.  Under sterile conditions and local anesthesia, a 22 gauge spinal needle was utilized for procedural planning. Next, an 11 gauge coaxial bone biopsy needle was advanced into the left iliac marrow space. Needle position was confirmed with CT imaging. Initially, bone marrow aspiration was performed. Next, a bone marrow biopsy was obtained with the 11 gauge outer bone marrow device. Samples were prepared with the cytotechnologist and deemed adequate. The needle was removed intact. Hemostasis was obtained with compression and a dressing was placed. The patient tolerated the procedure well without immediate post procedural complication.  IMPRESSION: Successful CT guided left iliac bone marrow aspiration and core biopsies.   Electronically Signed   By: Sandi Mariscal M.D.   On: 09/18/2015 14:10   Dg Chest Port 1 View  09/29/2015   CLINICAL DATA:  Shortness of breath and  chest pain.  B-cell lymphoma  EXAM: PORTABLE CHEST 1 VIEW  COMPARISON:  September 15, 2015  FINDINGS: There is a moderate right pleural effusion with right middle and lower lobe airspace consolidations/atelectasis. Elsewhere, the interstitium is somewhat diffusely prominent. Heart is upper  normal in size with pulmonary vascularity within normal limits. Port-A-Cath tip is in the superior vena cava just beyond the junction with the left innominate vein. No pneumothorax. No bone lesions. There is atherosclerotic change in the aorta.  IMPRESSION: Right effusion with right middle and lower lobe atelectasis/consolidation. Interstitium is prominent in a generalized manner; question chronic inflammatory type change versus mild chronic congestive heart failure.   Electronically Signed   By: Lowella Grip III M.D.   On: 09/29/2015 10:35   Dg Chest Port 1 View  09/13/2015   CLINICAL DATA:  Pt states woke up during the night with increased SOB, increased home 02, cough, thick sputum, ankles swollen bilaterally  EXAM: PORTABLE CHEST - 1 VIEW  COMPARISON:  09/11/2015, 09/09/2015  FINDINGS: Large right pleural effusion with underlying opacity. Mild cardiac enlargement. Coarsened lung markings. Known metastatic disease.  IMPRESSION: Large right pleural effusion with middle and lower lobe consolidation on the right. No change from recent prior studies.   Electronically Signed   By: Skipper Cliche M.D.   On: 09/13/2015 07:36   Dg Chest Port 1 View  09/11/2015   CLINICAL DATA:  Port-A-Cath placement  EXAM: DG C-ARM 1-60 MIN-NO REPORT; PORTABLE CHEST - 1 VIEW  COMPARISON:  PET-CT September 03, 2015  FINDINGS: Port-A-Cath tip is in the superior vena cava. No pneumothorax. There is a sizable right pleural effusion with right lower lobe and right middle lobe consolidation. Multiple small nodular lesions are noted consistent with metastatic disease, better seen on the PET-CT examination. Heart is upper normal in size with  pulmonary vascular within normal limits. No adenopathy appreciable. No bone lesions appreciable.  IMPRESSION: Port-A-Cath in superior vena cava. No pneumothorax. Evidence of small pulmonary nodular lesions, better seen on recent PET-CT. Sizable right effusion with right middle and lower lobe consolidation.   Electronically Signed   By: Lowella Grip III M.D.   On: 09/11/2015 13:18   Dg C-arm 1-60 Min-no Report  09/11/2015   CLINICAL DATA: lymphadenopathy   C-ARM 1-60 MINUTES  Fluoroscopy was utilized by the requesting physician.  No radiographic  interpretation.    US Thoracentesis Asp Pleural Space W/img Guide  10/01/2015   CLINICAL DATA:  Recurrent RIGHT pleural effusion  EXAM: ULTRASOUND GUIDED DIAGNOSTIC AND THERAPEUTIC THORACENTESIS  COMPARISON:  09/15/2015  PROCEDURE: Procedure, benefits, and risks of procedure were discussed with patient.  Written informed consent for procedure was obtained.  Time out protocol followed.  Pleural effusion localized by ultrasound at the posterior RIGHT hemithorax.  Skin prepped and draped in usual sterile fashion.  Skin and soft tissues anesthetized with 8 mL of 1% lidocaine.  8 French thoracentesis catheter placed into the RIGHT pleural space.  1500 mL of yellow RIGHT pleural fluid aspirated by syringe pump.  Procedure tolerated well by patient without immediate complication.  COMPLICATIONS: None  FINDINGS: A total of approximately 1500 mL of RIGHT pleural fluid was removed.  A fluid sample of 180 mL wassent for laboratory analysis.  IMPRESSION: Successful ultrasound guided RIGHT thoracentesis yielding 1500 mL of RIGHT pleural fluid.   Electronically Signed   By: Lavonia Dana M.D.   On: 10/01/2015 15:27   US Thoracentesis Asp Pleural Space W/img Guide  09/15/2015   CLINICAL DATA:  Recurrent right pleural effusion  EXAM: ULTRASOUND GUIDED RIGHT THORACENTESIS  COMPARISON:  None.  PROCEDURE: An ultrasound guided thoracentesis was thoroughly discussed with the patient  and questions answered. The benefits, risks, alternatives and complications were also discussed. The patient understands  and wishes to proceed with the procedure. Written consent was obtained.  Ultrasound was performed to localize and mark an adequate pocket of fluid in the right posterior chest. The area was then prepped and draped in the normal sterile fashion. 1% Lidocaine was used for local anesthesia. Under ultrasound guidance a 19 gauge Yueh catheter was introduced. Thoracentesis was performed. The catheter was removed and a dressing applied.  COMPLICATIONS: None.  FINDINGS: A total of approximately 2 L of amber fluid was removed. A fluid sample wassent for laboratory analysis.  IMPRESSION: Successful ultrasound guided right thoracentesis yielding 2 L of pleural fluid.   Electronically Signed   By: Rolm Baptise M.D.   On: 09/15/2015 14:48       Labs: Basic Metabolic Panel:  Recent Labs Lab 09/29/15 1019 09/30/15 0549 10/01/15 0605  NA 140 139 138  K 4.3 3.7 3.4*  CL 106 105 104  CO2 _0 GLUCOSE 133* 81 84  BUN 48* 34* 26*  CREATININE 1.14 0.93 1.11  CALCIUM 7.9* 7.6* 7.6*  MG 1.9  --   --   PHOS 3.4  --   --    Liver Function Tests:  Recent Labs Lab 09/29/15 1019 09/30/15 0549 10/01/15 0605  AST _1 ALT _2 ALKPHOS 71 60 70  BILITOT 0.7 2.0* 1.7*  PROT 4.9* 4.4* 4.9*  ALBUMIN 2.6* 2.4* 2.6*    CBC:  Recent Labs Lab 09/29/15 1019 09/30/15 0549 10/01/15 0605 10/02/15 0615  WBC 7.5 6.1 8.0 6.3  NEUTROABS 6.0 4.8 6.2  --   HGB 5.7* 7.7* 9.5* 9.4*  HCT 17.1* 21.9* 28.1* 28.0*  MCV 103.6* 96.1 95.9 98.9  PLT 85* 64* 60* 43*    BNP: BNP (last 3 results)  Recent Labs  08/22/15 1421 09/13/15 0650 09/29/15 1222  BNP 358.0* 595.0* 226.0*    CBG:  Recent Labs Lab 09/29/15 2057  GLUCAP 89     Signed:  Jehanzeb Memon. MD [facility] 10/02/2015, 1:15 PM   By signing my name below, I, Rosalie Doctor, attest that this  documentation has been prepared under the direction and in the presence of [facility]. MD Electronically Signed: Rosalie Doctor, Scribe. 10/02/2015 12:53pm  I, Dr. Kathie Dike, personally performed the services described in this documentaiton. All medical record entries made by the scribe were at my direction and in my presence. I have reviewed the chart and agree that the record reflects my personal performance and is accurate and complete  Kathie Dike, MD, 10/02/2015 1:16 PM

## 2015-10-04 NOTE — Progress Notes (Signed)
[name], MD [place] [id number]  Diffuse large B-cell lymphoma of lymph nodes of multiple regions [facility]) - Plan: CBC with Differential, Comprehensive metabolic panel, CBC with Differential, Comprehensive metabolic panel, Sennosides-Docusate Sodium (SENOKOT S PO), predniSONE (DELTASONE) 20 MG tablet, US Pelvis Limited, CANCELED: US Pelvis Complete  Chronic atrial fibrillation (HCC) - Plan: Rivaroxaban (XARELTO STARTER PACK) 15 & 20 MG TBPK  CURRENT THERAPY: S/P 1 cycle mini-R-CHOP  INTERVAL HISTORY: [name] 81 y.o. 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    Diffuse large B cell lymphoma ([place])   08/22/2015 - 08/25/2015 Hospital Admission Acute respiratory distress   08/22/2015 Imaging CTA chest- Right infrahilar lymph node versus central lung mass measuring 2.8 x 4.1 cm. Worsening mediastinal adenopathy and bilateral hilar adenopathy as described with the largest node over the subcarinal region measuring 2.7 cm by short axis...   08/24/2015 Pathology Results Diagnosis PLEURAL FLUID, RIGHT(SPECIMEN 1 OF 1 COLLECTED 08/24/15): ATYPICAL LYMPHOCYTES SUSPICIOUS FOR A LYMPHOPROLIFERATIVE PROCESS, SEE COMMENT. [name]   08/25/2015 Imaging CT abd/pelvis- Mild upper retroperitoneal (gastrohepatic ligament and right retrocrural) lymphadenopathy. Cirrhosis.   09/03/2015 PET scan Widespread metastatic adenopathy including the RIGHT cervical lymph nodes, LEFT axillary lymph nodes, mediastinal lymph, hilar lymph node, retroperitoneal lymph nodes and iliac lymph nodes and inguinal lymph nodes. Inguinal lymph nodes may be most....   09/11/2015 Pathology Results Diagnosis Lymph node, biopsy, right inguinal - DIFFUSE LARGE B CELL LYMPHOMA.   09/11/2015 Pathology Results Interpretation Tissue-Flow Cytometry - MONOCLONAL B-CELL POPULATION IDENTIFIED. Diagnosis Comment: The findings are consistent with non-Hodgkin B-cell  lymphoma. (BNS:ecj 09/16/2015)   09/13/2015 - 09/18/2015 Hospital Admission COPD exacerbation   09/18/2015 Bone Marrow Biopsy Bone Marrow, Aspirate,Biopsy, and Clot, left iliac crest: Despite limited findings, the features are worrisome for minimal involvement by B cell lymphoproliferative process, particularly given the previous history of large B cell lymphoma.    09/21/2015 -  Chemotherapy Mini-R-CHOP   09/29/2015 -  Hospital Admission SOB, anemia, dehydration, recurrent pleural effusion (right) Direct admit from [facility].  S/p 5 units of PRBCs, therapeutic thoracentesis on right relieveing 1.5 L of fluid, adjustment of pain medication.    I personally reviewed and went over laboratory results with the patient.  The results are noted within this dictation.  He is doing much better.  He is currently staying with his daughter.  He notes that he is getting around good today.  He comes to the clinic in a wheelchair, but he is witness walking to the restroom.  He has his O2 tank with him, but no [place] in place today.  He notes that his breathing is good.    He reports that he is feeling good.  He is much improved since last week he notes.  He reports that he does not feel as good as he did in July when he was planning fishing trips.  He notes that he not too active because there isn't much to do, but he cares for himself.  He shows me his right groin at the site of the excisional lymph node biopsy.  There is clearly an enlarged area.  See exam for details.  We spent a lot of time discussing intrathecal chemotherapy to reduce the risk of CNS involvement in his NHL.  I reviewed the procedure in detail with the patient and his daughter.  They understand the procedure and the logistics of this intervention.  They understand the reasoning for this treatment modality.  He is agreeable to this and more details will be discussed when he returns next week.  Past Medical History  Diagnosis Date  . COPD (chronic  obstructive pulmonary disease) (HCC)     Uses occasional nighttime O2  . Disseminated herpes zoster 2010  . GERD (gastroesophageal reflux disease)   . Asthma   . Pulmonary embolus ([place])     2003 and 2011  . Pulmonary nodules     Chest CT 09/11  . Mixed hyperlipidemia   . Chronic atrial fibrillation ([place])   . Lupus anticoagulant positive   . Essential hypertension, benign   . Cholelithiasis   . Rheumatoid arthritis(714.0)   . History of chicken pox 1941; 2011  . Anemia   . Chronic lower back pain   . Chronic diastolic heart failure ([place])   . Cirrhosis ([place])     Questionable, AFP normal Feb 01/2012, hx of ETOH use   . Headache(784.0)   . Cervical spondylosis without myelopathy   . Stage III chronic kidney disease   . Cellulitis of leg, left 01/03/2015  . Aortic stenosis, moderate 05/21/2015  . Dysrhythmia     chronic AFib  . Diffuse large B cell lymphoma ([place]) 08/22/2015    has Essential hypertension, benign; PULMONARY EMBOLISM; Chronic atrial fibrillation ([place]); Rheumatoid arthritis ([place]); Lupus anticoagulant positive; GERD (gastroesophageal reflux disease); Cirrhosis ([place]); COPD (chronic obstructive pulmonary disease) ([place]); Long term current use of anticoagulant; Arthritis; Anemia; Adenomatous polyps; Chronic diastolic heart failure ([place]); Headache(784.0); Cervical spondylosis without myelopathy; Stiffness of joints, not elsewhere classified, multiple sites; Posture imbalance; Cellulitis of leg, left; Acute kidney injury ([place]); Thrombocytopenia ([place]); Stage III chronic kidney disease; Acute on chronic diastolic CHF (congestive heart failure) ([place]); COPD exacerbation ([place]); Aortic stenosis, moderate; Acute diastolic CHF (congestive heart failure) ([place]); Acute respiratory distress ([place]); Diffuse large B cell lymphoma ([place]); Chronic a-fib (HCC); BPH (benign prostatic hyperplasia); Atrial fibrillation with RVR ([place]); HCAP (healthcare-associated pneumonia); COPD with acute exacerbation ([place]);  Recurrent right pleural effusion; Chronic diastolic CHF (congestive heart failure) ([place]); Chronic respiratory failure ([place]); and Malignant pleural effusion on his problem list.     is allergic to cymbalta; procaine hcl; and bystolic.  Current Outpatient Prescriptions on File Prior to Visit  Medication Sig Dispense Refill  . albuterol (PROAIR HFA) 108 (90 BASE) MCG/ACT inhaler Inhale 2 puffs into the lungs every 6 (six) hours as needed. Shortness of Breath 1 Inhaler 3  . allopurinol (ZYLOPRIM) 300 MG tablet Take 1 tablet (300 mg total) by mouth daily. 30 tablet 3  . cetirizine (ZYRTEC) 10 MG tablet Take 10 mg by mouth daily.      Marland Kitchen DIGOX 125 MCG tablet TAKE ONE TABLET BY MOUTH DAILY 30 tablet 6  . diltiazem (CARDIZEM CD) 240 MG 24 hr capsule Take 1 capsule (240 mg total) by mouth daily. 30 capsule 1  . ferrous sulfate 325 (65 FE) MG tablet Take 325 mg by mouth daily.    . fluticasone (FLONASE) 50 MCG/ACT nasal spray Place 2 sprays into both nostrils daily.   0  . folic acid (FOLVITE) 1 MG tablet Take 1 mg by mouth daily.     . furosemide (LASIX) 40 MG tablet Take 1 tablet (40 mg total) by mouth daily. Take daily for prevention of fluid buildup. 30 tablet   . gabapentin (NEURONTIN) 300 MG capsule Take 2 capsules (600 mg total) by mouth 2 (two) times daily. 30 capsule 0  . guaiFENesin ([place])  600 MG 12 hr tablet Take 1 tablet (600 mg total) by mouth 2 (two) times daily. 20 tablet 0  . ipratropium-albuterol (DUONEB) 0.5-2.5 (3) MG/3ML SOLN Take 3 mLs by nebulization every 6 (six) hours as needed. (Patient taking differently: Take 3 mLs by nebulization every 6 (six) hours as needed (shortness of breath). ) 360 mL 1  . ketoconazole (NIZORAL) 2 % shampoo Apply 1 application topically 3 (three) times a week.     . lidocaine-prilocaine (EMLA) cream Apply a quarter size amount to port site 1 hour prior to chemo. Do not rub in. Cover with plastic wrap. 30 g 3  . Omega-3 Fatty Acids (FISH OIL) 1000 MG CAPS  Take 2 capsules by mouth 2 (two) times daily.     Marland Kitchen omeprazole (PRILOSEC) 40 MG capsule Take 40 mg by mouth daily.    . ondansetron (ZOFRAN) 8 MG tablet Take 1 tablet every 8 hours as needed for nausea/vomiting. (Patient taking differently: Take 8 mg by mouth every 8 (eight) hours as needed for nausea or vomiting. ) 30 tablet 3  . oxyCODONE 10 MG TABS Take 1 tablet (10 mg total) by mouth every 6 (six) hours as needed for severe pain. 30 tablet 0  . potassium chloride SA (K-DUR,KLOR-CON) 20 MEQ tablet Take 0.5 tablets (10 mEq total) by mouth daily. Starting 08/27/15.    Marland Kitchen prochlorperazine (COMPAZINE) 10 MG tablet Take 1 tablet (10 mg total) by mouth every 6 (six) hours as needed (Nausea or vomiting). (Patient taking differently: Take 10 mg by mouth every 6 (six) hours as needed (Nausea or vomiting). nausea) 30 tablet 3  . tamsulosin (FLOMAX) 0.4 MG CAPS capsule Take 1 capsule (0.4 mg total) by mouth at bedtime. For prostate treatment. 30 capsule 3  . Vitamin D, Ergocalciferol, (DRISDOL) 50000 UNITS CAPS capsule Take 1 capsule by mouth once a week. friday  4  . tadalafil (CIALIS) 5 MG tablet Take 5 mg by mouth daily as needed for erectile dysfunction.      No current facility-administered medications on file prior to visit.    Past Surgical History  Procedure Laterality Date  . Polypectomy  02/02/2012    RMR: Multiple colonic polyps removed, flat, tubular adenomas/ Left-sided diverticulosis  . Posterior lumbar vertebrae excision  2003; 2009; 2011  . Myringotomy      "3 times; both ears"  . Cataract extraction w/ intraocular lens  implant, bilateral    . Cholecystectomy  05/23/2012    Procedure: LAPAROSCOPIC CHOLECYSTECTOMY WITH INTRAOPERATIVE CHOLANGIOGRAM;  Surgeon: Stark Klein, MD;  Location: [place];  Service: General;  Laterality: N/A;  . Esophagogastroduodenoscopy  02/02/12    XNT:ZGYFVCBS size hiatal hernia; otherwise normal exam  . Colonoscopy  01/24/2013    WHQ:PRFFMBW polyps/colonic  diverticulosis  . Back surgery    . Portacath placement    . Lymph node biopsy    . Lymph node biopsy Right 09/11/2015    Procedure: RIGHT INGUINAL LYMPH NODE BIOPSY;  Surgeon: Aviva Signs, MD;  Location: [facility];  Service: General;  Laterality: Right;  . Portacath placement Left 09/11/2015    Procedure: INSERTION PORT-A-CATH;  Surgeon: Aviva Signs, MD;  Location: [facility];  Service: General;  Laterality: Left;    Denies any headaches, dizziness, double vision, fevers, chills, night sweats, nausea, vomiting, diarrhea, constipation, chest pain, heart palpitations, shortness of breath, blood in stool, black tarry stool, urinary pain, urinary burning, urinary frequency, hematuria.   PHYSICAL EXAMINATION  ECOG PERFORMANCE STATUS: 1 - Symptomatic but completely  ambulatory  Filed Vitals:   10/05/15 1129  BP: 101/49  Pulse: 93  Temp: 99.4 F (37.4 C)  Resp: 18    GENERAL:alert, no distress, well nourished, well developed, comfortable, cooperative, smiling and accompanied by his daughter. SKIN: skin color, texture, turgor are normal, no rashes or significant lesions HEAD: Normocephalic, No masses, lesions, tenderness or abnormalities EYES: normal, PERRLA, EOMI, Conjunctiva are pink and non-injected EARS: External ears normal OROPHARYNX:lips, buccal mucosa, and tongue normal and mucous membranes are moist  NECK: supple, trachea midline LYMPH:  not examined BREAST:not examined LUNGS: decreased breath sounds HEART: irregularly irregular ABDOMEN:abdomen soft and normal bowel sounds BACK: Back symmetric, no curvature., No CVA tenderness EXTREMITIES:less then 2 second capillary refill, no joint deformities, effusion, or inflammation, no skin discoloration, no cyanosis  NEURO: alert & oriented x 3 with fluent speech, no focal motor/sensory deficits, gait normal PELVIC:right inguinal mass-collection at the site of excisional lymph node biopsy that is soft and nontender.   LABORATORY  DATA: CBC    Component Value Date/Time   WBC 11.0* 10/05/2015 1145   RBC 3.13* 10/05/2015 1145   HGB 10.2* 10/05/2015 1145   HCT 32.0* 10/05/2015 1145   HCT 34 01/11/2012 1007   PLT 80* 10/05/2015 1145   MCV 102.2* 10/05/2015 1145   MCV 92.0 01/11/2012 1007   MCH 32.6 10/05/2015 1145   MCHC 31.9 10/05/2015 1145   RDW 17.9* 10/05/2015 1145   LYMPHSABS 0.7 10/05/2015 1145   MONOABS 1.0 10/05/2015 1145   EOSABS 0.2 10/05/2015 1145   BASOSABS 0.1 10/05/2015 1145      Chemistry      Component Value Date/Time   NA 136 10/05/2015 1145   NA 140 01/11/2012 1005   K 3.5 10/05/2015 1145   K 4.5 01/11/2012 1005   CL 98* 10/05/2015 1145   CO2 32 10/05/2015 1145   BUN 14 10/05/2015 1145   BUN 12 01/11/2012 1005   CREATININE 1.26* 10/05/2015 1145   CREATININE 1.17 01/11/2012 1005      Component Value Date/Time   CALCIUM 7.2* 10/05/2015 1145   CALCIUM 9.2 01/11/2012 1005   ALKPHOS 92 10/05/2015 1145   AST 27 10/05/2015 1145   ALT 23 10/05/2015 1145   BILITOT 1.2 10/05/2015 1145        PENDING LABS:   RADIOGRAPHIC STUDIES:  Dg Chest 1 View  10/01/2015   CLINICAL DATA:  Status post right thoracentesis  EXAM: CHEST  1 VIEW  COMPARISON:  09/29/2015  FINDINGS: There is been significant reduction in the right-sided pleural effusion. Only minimal fluid remains. No pneumothorax is seen. A left chest wall port is again noted and stable. The cardiac shadow is stable. The left lung remains clear.  IMPRESSION: No pneumothorax following thoracentesis on the right.   Electronically Signed   By: Inez Catalina M.D.   On: 10/01/2015 14:27   Dg Chest 1 View  09/15/2015   CLINICAL DATA:  Status post thoracentesis  EXAM: CHEST  1 VIEW  COMPARISON:  09/13/15  FINDINGS: There is moderate cardiac enlargement. Left chest wall port a catheter is noted with tip in the projection of the SVC. Cardiac enlargement is noted. There has been decrease in volume of right pleural effusion. No significant  pneumothorax identified. Similar appearance of diffuse pulmonary edema.  IMPRESSION: 1. No pneumothorax after right thoracentesis.   Electronically Signed   By: Kerby Moors M.D.   On: 09/15/2015 14:12   Chest 2 View  09/09/2015   CLINICAL DATA:  Shortness of breath for a long time. History of asthma, hypertension an widespread lung cancer.  EXAM: CHEST  2 VIEW  COMPARISON:  Radiographs 08/24/2015.  PET-CT 09/03/2015.  FINDINGS: The heart size and mediastinal contours are stable. The patient has known mediastinal and hilar adenopathy. Right pleural effusion and dependent right basilar pulmonary opacity are not significantly changed from recent PET-CT. The interstitial markings are diffusely coarsened, corresponding with emphysema on CT. The underlying pulmonary nodularity is not well visualized. Anterior wedging in the lower thoracic spine appears grossly stable.  IMPRESSION: Compared with PET-CT of 6 days ago, no acute findings demonstrated. Persistent right pleural effusion, right basilar opacity and emphysema noted. Known metastatic disease better demonstrated on PET-CT.   Electronically Signed   By: Richardean Sale M.D.   On: 09/09/2015 09:58   Dg Lumbar Spine Complete  09/30/2015   CLINICAL DATA:  Nonspecific low back pain and stiffness with radiation to lower extremity for several days. History of diffuse large B-cell lymphoma and rheumatoid arthritis.  EXAM: LUMBAR SPINE - COMPLETE 4+ VIEW  COMPARISON:  CT abdomen pelvis - 08/25/2015 ; 07/05/2013  FINDINGS: There are 5 non rib-bearing lumbar type vertebral bodies.  There is a mild scoliotic curvature of the thoracolumbar spine with dominant caudal component convex to the left. Post L4-L5 paraspinal fusion without definite evidence of hardware failure or loosening.  Unchanged mild (under 25%) compression deformities involving the inferior endplate of the S93 vertebral body as well as the superior endplates of the L1 and L2 vertebral bodies, grossly  unchanged since the 06/2013 abdominal CT. Remaining lumbar vertebral body heights are preserved.  Re- demonstrated moderate DDD of L3-L4 and L5-S1 with disc space height loss, endplate irregularity and sclerosis.  Calcified atherosclerotic plaque within the abdominal aorta.  Limited visualization of the bilateral SI joints is normal. Moderate colonic stool burden without evidence of enteric obstruction. Post cholecystectomy.  IMPRESSION: 1. No acute findings. 2. Post L4-L5 paraspinal fusion without evidence of hardware failure or loosening. 3. Moderate DDD of L3-L4 and L5-S1.   Electronically Signed   By: Sandi Mariscal M.D.   On: 09/30/2015 20:46   Nm Cardiac [place] Rest  09/17/2015   CLINICAL DATA:  Hodgkin's lymphoma. Patient to receive cardiotoxic chemotherapy.  EXAM: NUCLEAR MEDICINE CARDIAC BLOOD POOL IMAGING (MUGA)  TECHNIQUE: Cardiac multi-gated acquisition was performed at rest following intravenous injection of Tc-62mlabeled red blood cells.  RADIOPHARMACEUTICALS:  26.0 mCi Tc-946mDP in-vitro labeled red blood cells IV  COMPARISON:  None.  FINDINGS: The left ventricular ejection fraction is equal to 65.9%. There is left ventricular normal wall motion.  IMPRESSION: 1. Left ventricular ejection fraction equals 65.9%.   Electronically Signed   By: TaKerby Moors.D.   On: 09/17/2015 13:37   UsKoreaelvis Limited  10/05/2015   CLINICAL DATA:  Right groin mass for 2 months.  Known lymphoma.  EXAM: LIMITED ULTRASOUND OF PELVIS  TECHNIQUE: Limited transabdominal ultrasound examination of the pelvis was performed.  COMPARISON:  CT, 08/25/2015  FINDINGS: There is an irregular fluid collection in the right groin with no internal blood flow. Echogenic septations protrude into fluid collection, but there is no obvious mural mass. Collection somewhat difficult to measure given its irregular configuration, but measures approximately 6.6 x 6.4 x 5.2 cm.  IMPRESSION: 1. Right groin mass appears to be an irregular fluid  collection with no convincing internal blood flow. The etiology of this is unclear. It could potentially reflect a mass or enlarged lymph node  with significant internal necrosis. Alternatively, it may reflect herniated peritoneum distended with ascites. There was no hernia on the prior CT, however. Recommend follow-up contrast-enhanced CT for more comprehensive assessment of this abnormality.   Electronically Signed   By: Lajean Manes M.D.   On: 10/05/2015 14:17   US Venous Img Lower Unilateral Right  10/01/2015   CLINICAL DATA:  Right leg swelling x2 days. pain. lymphoma. previous tobacco abuse.  EXAM: RIGHT LOWER EXTREMITY VENOUS DOPPLER ULTRASOUND  TECHNIQUE: Gray-scale sonography with compression, as well as color and duplex ultrasound, were performed to evaluate the deep venous system from the level of the common femoral vein through the popliteal and proximal calf veins.  COMPARISON:  None  FINDINGS: Normal compressibility of the common femoral, superficial femoral, and popliteal veins, as well as the proximal calf veins. No filling defects to suggest DVT on grayscale or color Doppler imaging. Doppler waveforms show normal direction of venous flow, normal respiratory phasicity and response to augmentation. Subcutaneous edema in the calf. Cystic right inguinal adenopathy partially visualized. Survey views of the contralateral common femoral vein are unremarkable.  IMPRESSION: 1. No evidence of lower extremity deep vein thrombosis, RIGHT.   Electronically Signed   By: Lucrezia Europe M.D.   On: 10/01/2015 16:40   Ct Biopsy  09/18/2015   INDICATION: History of non-Hodgkin's lymphoma. Please perform CT-guided biopsy for tissue diagnostic purposes.  EXAM: CT-GUIDED BONE MARROW BIOPSY AND ASPIRATION  MEDICATIONS: Fentanyl 50 mcg IV; Versed 3 mg IV  ANESTHESIA/SEDATION: Sedation Time  10 minutes  CONTRAST:  None  COMPLICATIONS: None immediate.  PROCEDURE: Informed consent was obtained from the patient following an  explanation of the procedure, risks, benefits and alternatives. The patient understands, agrees and consents for the procedure. All questions were addressed. A time out was performed prior to the initiation of the procedure. The patient was positioned prone and non-contrast localization CT was performed of the pelvis to demonstrate the iliac marrow spaces. The operative site was prepped and draped in the usual sterile fashion.  Under sterile conditions and local anesthesia, a 22 gauge spinal needle was utilized for procedural planning. Next, an 11 gauge coaxial bone biopsy needle was advanced into the left iliac marrow space. Needle position was confirmed with CT imaging. Initially, bone marrow aspiration was performed. Next, a bone marrow biopsy was obtained with the 11 gauge outer bone marrow device. Samples were prepared with the cytotechnologist and deemed adequate. The needle was removed intact. Hemostasis was obtained with compression and a dressing was placed. The patient tolerated the procedure well without immediate post procedural complication.  IMPRESSION: Successful CT guided left iliac bone marrow aspiration and core biopsies.   Electronically Signed   By: Sandi Mariscal M.D.   On: 09/18/2015 14:10   Dg Chest Port 1 View  09/29/2015   CLINICAL DATA:  Shortness of breath and chest pain.  B-cell lymphoma  EXAM: PORTABLE CHEST 1 VIEW  COMPARISON:  September 15, 2015  FINDINGS: There is a moderate right pleural effusion with right middle and lower lobe airspace consolidations/atelectasis. Elsewhere, the interstitium is somewhat diffusely prominent. Heart is upper normal in size with pulmonary vascularity within normal limits. Port-A-Cath tip is in the superior vena cava just beyond the junction with the left innominate vein. No pneumothorax. No bone lesions. There is atherosclerotic change in the aorta.  IMPRESSION: Right effusion with right middle and lower lobe atelectasis/consolidation. Interstitium is  prominent in a generalized manner; question chronic inflammatory type change versus mild  chronic congestive heart failure.   Electronically Signed   By: Lowella Grip III M.D.   On: 09/29/2015 10:35   Dg Chest Port 1 View  09/13/2015   CLINICAL DATA:  Pt states woke up during the night with increased SOB, increased home 02, cough, thick sputum, ankles swollen bilaterally  EXAM: PORTABLE CHEST - 1 VIEW  COMPARISON:  09/11/2015, 09/09/2015  FINDINGS: Large right pleural effusion with underlying opacity. Mild cardiac enlargement. Coarsened lung markings. Known metastatic disease.  IMPRESSION: Large right pleural effusion with middle and lower lobe consolidation on the right. No change from recent prior studies.   Electronically Signed   By: Skipper Cliche M.D.   On: 09/13/2015 07:36   Dg Chest Port 1 View  09/11/2015   CLINICAL DATA:  Port-A-Cath placement  EXAM: DG C-ARM 1-60 MIN-NO REPORT; PORTABLE CHEST - 1 VIEW  COMPARISON:  PET-CT September 03, 2015  FINDINGS: Port-A-Cath tip is in the superior vena cava. No pneumothorax. There is a sizable right pleural effusion with right lower lobe and right middle lobe consolidation. Multiple small nodular lesions are noted consistent with metastatic disease, better seen on the PET-CT examination. Heart is upper normal in size with pulmonary vascular within normal limits. No adenopathy appreciable. No bone lesions appreciable.  IMPRESSION: Port-A-Cath in superior vena cava. No pneumothorax. Evidence of small pulmonary nodular lesions, better seen on recent PET-CT. Sizable right effusion with right middle and lower lobe consolidation.   Electronically Signed   By: Lowella Grip III M.D.   On: 09/11/2015 13:18   Dg C-arm 1-60 Min-no Report  09/11/2015   CLINICAL DATA: lymphadenopathy   C-ARM 1-60 MINUTES  Fluoroscopy was utilized by the requesting physician.  No radiographic  interpretation.    US Thoracentesis Asp Pleural Space W/img Guide  10/01/2015    CLINICAL DATA:  Recurrent RIGHT pleural effusion  EXAM: ULTRASOUND GUIDED DIAGNOSTIC AND THERAPEUTIC THORACENTESIS  COMPARISON:  09/15/2015  PROCEDURE: Procedure, benefits, and risks of procedure were discussed with patient.  Written informed consent for procedure was obtained.  Time out protocol followed.  Pleural effusion localized by ultrasound at the posterior RIGHT hemithorax.  Skin prepped and draped in usual sterile fashion.  Skin and soft tissues anesthetized with 8 mL of 1% lidocaine.  8 French thoracentesis catheter placed into the RIGHT pleural space.  1500 mL of yellow RIGHT pleural fluid aspirated by syringe pump.  Procedure tolerated well by patient without immediate complication.  COMPLICATIONS: None  FINDINGS: A total of approximately 1500 mL of RIGHT pleural fluid was removed.  A fluid sample of 180 mL wassent for laboratory analysis.  IMPRESSION: Successful ultrasound guided RIGHT thoracentesis yielding 1500 mL of RIGHT pleural fluid.   Electronically Signed   By: Lavonia Dana M.D.   On: 10/01/2015 15:27   US Thoracentesis Asp Pleural Space W/img Guide  09/15/2015   CLINICAL DATA:  Recurrent right pleural effusion  EXAM: ULTRASOUND GUIDED RIGHT THORACENTESIS  COMPARISON:  None.  PROCEDURE: An ultrasound guided thoracentesis was thoroughly discussed with the patient and questions answered. The benefits, risks, alternatives and complications were also discussed. The patient understands and wishes to proceed with the procedure. Written consent was obtained.  Ultrasound was performed to localize and mark an adequate pocket of fluid in the right posterior chest. The area was then prepped and draped in the normal sterile fashion. 1% Lidocaine was used for local anesthesia. Under ultrasound guidance a 19 gauge Yueh catheter was introduced. Thoracentesis was performed. The catheter was  removed and a dressing applied.  COMPLICATIONS: None.  FINDINGS: A total of approximately 2 L of amber fluid was  removed. A fluid sample wassent for laboratory analysis.  IMPRESSION: Successful ultrasound guided right thoracentesis yielding 2 L of pleural fluid.   Electronically Signed   By: Rolm Baptise M.D.   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  Staging completed.  Oncology history updated.  Labs today: CBC diff, CMET  Follow-up since hospitalization today: He is doing well.  His labs are much improved.  He is breathing back at baseline with diminished breath sounds bilaterally.  He is seen ambulating in the exam room.    He notes a right inguinal mass at the site of his excisional lymph node biopsy.  I suspect seroma.  [place] was performed and definitive dx was not ascertained.  Discussed with Dr. Arnoldo Morale who agrees that it is a seroma.  He advised me to have the patient call his office this week for an appointment.  He will drain it if indicated at time of visit.  We discussed the need for intrathecal chemotherapy given his bone marrow biopsy revealing involvement with lymphoma.  CNS involvement risk is elevated as a result.  Systemic therapy does not cross the blood-brain barrier in high enough concentration to treat/prevent CNS involvement.  Intrathecal treatment will be given here locally with the assistance of radiology.  I have discussed the risks, benefits, alternatives, and side effects of this intervention.  I briefly discusses "spinal headaches" with the patient and daughter.  I could not find unbranded patient information regarding intrathecal intervention.  He is agreeable to this intervention but would like to think about it over the week and re-convene next week as planned with Dr. Whitney Muse.  Rx for Xarelto.  Platelet count is up and therefore, it is safe to restart Xarelto.  Will start with a reloading program of 15 mg BID x 21 days followed by 20 mg daily.  Rx printed for  the patient.  I have refilled his prednisone as well for 90 mg on Days 1-5 of treatment.  Return next week for follow-up, pre-chemo labs, and cycle 2 of therapy.    THERAPY PLAN:  Continue treatment as planned.  Will add intrathecal therapy in the near future.  All questions were answered. The patient knows to call the clinic with any problems, questions or concerns. We can certainly see the patient much sooner if necessary.  Patient and plan discussed with Dr. Ancil Linsey and she is in agreement with the aforementioned.   This note is electronically signed by: Doy Mince 10/05/2015 4:12 PM

## 2015-10-04 NOTE — Assessment & Plan Note (Addendum)
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  Staging completed.  Oncology history updated.  Labs today: CBC diff, CMET  Follow-up since hospitalization today: He is doing well.  His labs are much improved.  He is breathing back at baseline with diminished breath sounds bilaterally.  He is seen ambulating in the exam room.    He notes a right inguinal mass at the site of his excisional lymph node biopsy.  I suspect seroma.  [place] was performed and definitive dx was not ascertained.  Discussed with Dr. Arnoldo Morale who agrees that it is a seroma.  He advised me to have the patient call his office this week for an appointment.  He will drain it if indicated at time of visit.  We discussed the need for intrathecal chemotherapy given his bone marrow biopsy revealing involvement with lymphoma.  CNS involvement risk is elevated as a result.  Systemic therapy does not cross the blood-brain barrier in high enough concentration to treat/prevent CNS involvement.  Intrathecal treatment will be given here locally with the assistance of radiology.  I have discussed the risks, benefits, alternatives, and side effects of this intervention.  I briefly discusses "spinal headaches" with the patient and daughter.  I could not find unbranded patient information regarding intrathecal intervention.  He is agreeable to this intervention but would like to think about it over the week and re-convene next week as planned with Dr. Whitney Muse.  Rx for Xarelto.  Platelet count is up and therefore, it is safe to restart Xarelto.  Will start with a reloading program of 15 mg BID x 21 days followed by 20 mg daily.  Rx printed for the patient.  I have refilled his prednisone as well for 90 mg on Days 1-5 of treatment.  Return next week for follow-up, pre-chemo labs, and cycle 2 of therapy.

## 2015-10-05 ENCOUNTER — Ambulatory Visit (HOSPITAL_COMMUNITY)
Admission: RE | Admit: 2015-10-05 | Discharge: 2015-10-05 | Disposition: A | Discharge status: Home / Self Care | Payer: Medicare Other | Source: Ambulatory Visit | Attending: Oncology | Admitting: Oncology

## 2015-10-05 ENCOUNTER — Encounter (HOSPITAL_COMMUNITY): Payer: Self-pay | Admitting: Oncology

## 2015-10-05 ENCOUNTER — Encounter (HOSPITAL_BASED_OUTPATIENT_CLINIC_OR_DEPARTMENT_OTHER): Payer: Medicare Other

## 2015-10-05 ENCOUNTER — Encounter (HOSPITAL_COMMUNITY): Payer: Medicare Other | Attending: Oncology | Admitting: Oncology

## 2015-10-05 VITALS — BP 101/49 | HR 93 | Temp 99.4°F | Resp 18 | Wt 198.3 lb

## 2015-10-05 DIAGNOSIS — R790 Abnormal level of blood mineral: Secondary | ICD-10-CM | POA: Insufficient documentation

## 2015-10-05 DIAGNOSIS — R1909 Other intra-abdominal and pelvic swelling, mass and lump: Secondary | ICD-10-CM | POA: Insufficient documentation

## 2015-10-05 DIAGNOSIS — C8330 Diffuse large B-cell lymphoma, unspecified site: Secondary | ICD-10-CM | POA: Diagnosis not present

## 2015-10-05 DIAGNOSIS — D4989 Neoplasm of unspecified behavior of other specified sites: Secondary | ICD-10-CM

## 2015-10-05 DIAGNOSIS — R918 Other nonspecific abnormal finding of lung field: Secondary | ICD-10-CM | POA: Insufficient documentation

## 2015-10-05 DIAGNOSIS — C8338 Diffuse large B-cell lymphoma, lymph nodes of multiple sites: Secondary | ICD-10-CM | POA: Diagnosis not present

## 2015-10-05 DIAGNOSIS — I482 Chronic atrial fibrillation, unspecified: Secondary | ICD-10-CM

## 2015-10-05 LAB — CBC WITH DIFFERENTIAL/PLATELET
BASOS ABS: 0.1 10*3/uL (ref 0.0–0.1)
Basophils Relative: 1 %
EOS ABS: 0.2 10*3/uL (ref 0.0–0.7)
EOS PCT: 1 %
HEMATOCRIT: 32 % — AB (ref 39.0–52.0)
HEMOGLOBIN: 10.2 g/dL — AB (ref 13.0–17.0)
LYMPHS ABS: 0.7 10*3/uL (ref 0.7–4.0)
Lymphocytes Relative: 7 %
MCH: 32.6 pg (ref 26.0–34.0)
MCHC: 31.9 g/dL (ref 30.0–36.0)
MCV: 102.2 fL — ABNORMAL HIGH (ref 78.0–100.0)
Monocytes Absolute: 1 10*3/uL (ref 0.1–1.0)
Monocytes Relative: 9 %
Neutro Abs: 9.1 10*3/uL — ABNORMAL HIGH (ref 1.7–7.7)
Neutrophils Relative %: 82 %
Platelets: 80 10*3/uL — ABNORMAL LOW (ref 150–400)
RBC: 3.13 MIL/uL — ABNORMAL LOW (ref 4.22–5.81)
RDW: 17.9 % — ABNORMAL HIGH (ref 11.5–15.5)
SMEAR REVIEW: DECREASED
WBC: 11 10*3/uL — AB (ref 4.0–10.5)

## 2015-10-05 LAB — COMPREHENSIVE METABOLIC PANEL
ALBUMIN: 2.6 g/dL — AB (ref 3.5–5.0)
ALK PHOS: 92 U/L (ref 38–126)
ALT: 23 U/L (ref 17–63)
ANION GAP: 6 (ref 5–15)
AST: 27 U/L (ref 15–41)
BILIRUBIN TOTAL: 1.2 mg/dL (ref 0.3–1.2)
BUN: 14 mg/dL (ref 6–20)
CALCIUM: 7.2 mg/dL — AB (ref 8.9–10.3)
CO2: 32 mmol/L (ref 22–32)
CREATININE: 1.26 mg/dL — AB (ref 0.61–1.24)
Chloride: 98 mmol/L — ABNORMAL LOW (ref 101–111)
GFR calc Af Amer: 60 mL/min — ABNORMAL LOW (ref >60)
GFR calc non Af Amer: 51 mL/min — ABNORMAL LOW (ref >60)
Glucose, Bld: 122 mg/dL — ABNORMAL HIGH (ref 65–99)
Potassium: 3.5 mmol/L (ref 3.5–5.1)
SODIUM: 136 mmol/L (ref 135–145)
TOTAL PROTEIN: 5.6 g/dL — AB (ref 6.5–8.1)

## 2015-10-05 MED ORDER — PREDNISONE 20 MG PO TABS: ORAL_TABLET | ORAL | Status: DC

## 2015-10-05 MED ORDER — RIVAROXABAN (XARELTO) VTE STARTER PACK (15 & 20 MG): ORAL_TABLET | ORAL | Status: DC

## 2015-10-05 NOTE — Patient Instructions (Signed)
[place] [facility] Discharge Instructions  RECOMMENDATIONS MADE BY THE CONSULTANT AND ANY TEST RESULTS WILL BE SENT TO YOUR REFERRING PHYSICIAN.  Exam and discussion by Robynn Pane, PA-C Will get an ultrasound of your abdomen.  Think it may just be a fluid collection.  Follow-up next week as scheduled.  Thank you for choosing [facility] to provide your oncology and hematology care.  To afford each patient quality time with our provider, please arrive at least 15 minutes before your scheduled appointment time.    You need to re-schedule your appointment should you arrive 10 or more minutes late.  We strive to give you quality time with our providers, and arriving late affects you and other patients whose appointments are after yours.  Also, if you no show three or more times for appointments you may be dismissed from the clinic at the providers discretion.     Again, thank you for choosing [facility].  Our hope is that these requests will decrease the amount of time that you wait before being seen by our physicians.       _____________________________________________________________  Should you have questions after your visit to [facility], please contact our office at [phone] between the hours of 8:30 a.m. and 4:30 p.m.  Voicemails left after 4:30 p.m. will not be returned until the following business day.  For prescription refill requests, have your pharmacy contact our office.

## 2015-10-06 NOTE — Progress Notes (Signed)
Lab draw

## 2015-10-09 ENCOUNTER — Other Ambulatory Visit (HOSPITAL_COMMUNITY)
Admission: RE | Admit: 2015-10-09 | Discharge: 2015-10-09 | Disposition: A | Discharge status: Home / Self Care | Payer: Medicare Other | Source: Ambulatory Visit | Attending: Hematology & Oncology | Admitting: Hematology & Oncology

## 2015-10-09 DIAGNOSIS — C8590 Non-Hodgkin lymphoma, unspecified, unspecified site: Secondary | ICD-10-CM | POA: Diagnosis present

## 2015-10-12 ENCOUNTER — Encounter (HOSPITAL_BASED_OUTPATIENT_CLINIC_OR_DEPARTMENT_OTHER): Payer: Medicare Other

## 2015-10-12 ENCOUNTER — Encounter (HOSPITAL_COMMUNITY): Payer: Self-pay | Admitting: Hematology & Oncology

## 2015-10-12 ENCOUNTER — Other Ambulatory Visit (HOSPITAL_COMMUNITY): Payer: Self-pay | Admitting: Oncology

## 2015-10-12 ENCOUNTER — Inpatient Hospital Stay (HOSPITAL_COMMUNITY): Payer: Medicare Other

## 2015-10-12 ENCOUNTER — Encounter (HOSPITAL_BASED_OUTPATIENT_CLINIC_OR_DEPARTMENT_OTHER): Payer: Medicare Other | Admitting: Hematology & Oncology

## 2015-10-12 VITALS — BP 97/55 | HR 107 | Temp 98.5°F | Resp 22 | Wt 195.8 lb

## 2015-10-12 VITALS — BP 94/49 | HR 56 | Temp 97.4°F | Resp 16

## 2015-10-12 DIAGNOSIS — C8338 Diffuse large B-cell lymphoma, lymph nodes of multiple sites: Secondary | ICD-10-CM

## 2015-10-12 DIAGNOSIS — Z5112 Encounter for antineoplastic immunotherapy: Secondary | ICD-10-CM

## 2015-10-12 DIAGNOSIS — C8330 Diffuse large B-cell lymphoma, unspecified site: Secondary | ICD-10-CM | POA: Diagnosis not present

## 2015-10-12 DIAGNOSIS — R918 Other nonspecific abnormal finding of lung field: Secondary | ICD-10-CM | POA: Diagnosis not present

## 2015-10-12 LAB — CBC WITH DIFFERENTIAL/PLATELET
BASOS PCT: 0 %
Basophils Absolute: 0.1 10*3/uL (ref 0.0–0.1)
EOS ABS: 0.1 10*3/uL (ref 0.0–0.7)
Eosinophils Relative: 1 %
HEMATOCRIT: 33.4 % — AB (ref 39.0–52.0)
Hemoglobin: 10.8 g/dL — ABNORMAL LOW (ref 13.0–17.0)
Lymphocytes Relative: 4 %
Lymphs Abs: 0.6 10*3/uL — ABNORMAL LOW (ref 0.7–4.0)
MCH: 32.9 pg (ref 26.0–34.0)
MCHC: 32.3 g/dL (ref 30.0–36.0)
MCV: 101.8 fL — ABNORMAL HIGH (ref 78.0–100.0)
MONO ABS: 0.6 10*3/uL (ref 0.1–1.0)
MONOS PCT: 4 %
NEUTROS ABS: 13.9 10*3/uL — AB (ref 1.7–7.7)
Neutrophils Relative %: 91 %
PLATELETS: 225 10*3/uL (ref 150–400)
RBC: 3.28 MIL/uL — ABNORMAL LOW (ref 4.22–5.81)
RDW: 18.4 % — AB (ref 11.5–15.5)
WBC: 15.3 10*3/uL — ABNORMAL HIGH (ref 4.0–10.5)

## 2015-10-12 LAB — COMPREHENSIVE METABOLIC PANEL
ALT: 17 U/L (ref 17–63)
AST: 31 U/L (ref 15–41)
Albumin: 2.5 g/dL — ABNORMAL LOW (ref 3.5–5.0)
Alkaline Phosphatase: 56 U/L (ref 38–126)
Anion gap: 7 (ref 5–15)
BUN: 11 mg/dL (ref 6–20)
CHLORIDE: 101 mmol/L (ref 101–111)
CO2: 32 mmol/L (ref 22–32)
CREATININE: 1.3 mg/dL — AB (ref 0.61–1.24)
Calcium: 8.4 mg/dL — ABNORMAL LOW (ref 8.9–10.3)
GFR, EST AFRICAN AMERICAN: 57 mL/min — AB (ref >60)
GFR, EST NON AFRICAN AMERICAN: 49 mL/min — AB (ref >60)
Glucose, Bld: 119 mg/dL — ABNORMAL HIGH (ref 65–99)
POTASSIUM: 4.2 mmol/L (ref 3.5–5.1)
SODIUM: 140 mmol/L (ref 135–145)
Total Bilirubin: 1.5 mg/dL — ABNORMAL HIGH (ref 0.3–1.2)
Total Protein: 5.7 g/dL — ABNORMAL LOW (ref 6.5–8.1)

## 2015-10-12 MED ORDER — ACETAMINOPHEN 325 MG PO TABS
650.0000 mg | ORAL_TABLET | Freq: Once | ORAL | Status: AC
  Administered 2015-10-12: 650 mg via ORAL

## 2015-10-12 MED ORDER — DIPHENHYDRAMINE HCL 25 MG PO CAPS
50.0000 mg | ORAL_CAPSULE | Freq: Once | ORAL | Status: AC
  Administered 2015-10-12: 50 mg via ORAL

## 2015-10-12 MED ORDER — HEPARIN SOD (PORK) LOCK FLUSH 100 UNIT/ML IV SOLN
INTRAVENOUS | Status: AC
  Filled 2015-10-12: qty 5

## 2015-10-12 MED ORDER — SODIUM CHLORIDE 0.9 % IJ SOLN: 10.0000 mL | INTRAMUSCULAR | Status: DC | PRN

## 2015-10-12 MED ORDER — HEPARIN SOD (PORK) LOCK FLUSH 100 UNIT/ML IV SOLN
500.0000 [IU] | Freq: Once | INTRAVENOUS | Status: AC | PRN
  Administered 2015-10-12: 500 [IU]

## 2015-10-12 MED ORDER — DIPHENHYDRAMINE HCL 25 MG PO CAPS
ORAL_CAPSULE | ORAL | Status: AC
  Filled 2015-10-12: qty 2

## 2015-10-12 MED ORDER — SODIUM CHLORIDE 0.9 % IV SOLN
Freq: Once | INTRAVENOUS | Status: AC
  Administered 2015-10-12: 11:00:00 via INTRAVENOUS

## 2015-10-12 MED ORDER — ACETAMINOPHEN 325 MG PO TABS
ORAL_TABLET | ORAL | Status: AC
  Filled 2015-10-12: qty 2

## 2015-10-12 MED ORDER — SODIUM CHLORIDE 0.9 % IV SOLN
375.0000 mg/m2 | Freq: Once | INTRAVENOUS | Status: AC
  Administered 2015-10-12: 800 mg via INTRAVENOUS
  Filled 2015-10-12: qty 70

## 2015-10-12 MED ORDER — OXYCODONE HCL 10 MG PO TABS
10.0000 mg | ORAL_TABLET | Freq: Four times a day (QID) | ORAL | Status: DC | PRN
Start: 2015-10-12 — End: 2015-11-13

## 2015-10-12 NOTE — Progress Notes (Signed)
[name], MD [place] [address]  DLBCL, Stage IV LDH 242 (3-23 U/L) IPI of 3 High -intermediate Risk with 5 years OS 43%, CR of 55% Rheumatoid arthritis, Enbrel and MTX for "many years" BMBX with suggestion of involvement with DLBCL  CURRENT THERAPY: DLBCL, Stage IV  INTERVAL HISTORY:  [name] 81 y.o. male comes in for chemotherapy with R Mini-CHOP for a DLBCL. He was re-admitted for severe anemia and SOB. He underwent another thoracentesis.  [name] is here today with his daughter. She says he's been struggling; he has his moments where he eats good but it's "here or there." She remarks that it's hard to force him to do anything. When asked if this was a character trait of his, she laughs and says "have you met this man?" She adds that he's pretty strong willed. She also says he's struggling when it comes to his movement/staying physical.  His daughter remarks "we tried to manage the pain with the oxycodone, without the tylenol." She says it can be kept in check, but he still has a lot of confusion and disorientation. She does not think this is due to the pain medication, but states that she doesn't know what it could be due to. She says "I wonder if some of [the disorientation] was occurring previously, but he wasn't saying anything about it." She says his disorientation usually occurs later in the evenings. She says they weren't usyally there with him late in the evening in the past.  With regards to his treatment, Mr. Eckert says "it is what it is. If something's gotta be done, it's gotta be done." He says he doesn't feel like the treatment is making him worse.  When asked about when he feels confused, he chuckles. He confirms that he's been more confused lately, but he says "hell, I don't know" when asked when that started. He repeats that he really doesn't know when it started.  With his back pain he says he doesn't know about that  either. He remarks that it might be worse now than it was before. He says it's "not necessarily in the same spot" as it always had been. His back pain is located in the lower back on both sides of the back.  He denies any shooting pain in his legs. He says "I don't get those yet," and "I don't want any."  He says his breathing is all right. He adds "there are days and there are better days."  He confirms sleeping at night, and getting up every morning and getting dressed.   He says he eats "most of the time;" breakfast every morning. He says "my breakfast might not be like yours, but I'm eating breakfast." In general, his responses seem a little terse and slightly confrontational.  In terms of getting out and about, his daughter says they keep making plans but they haven't actually made it out. She says a few months ago, they were getting out more. Mr. Yera says they aren't getting out anymore because there's "nothing to do." He says he was out partying in the past; his daughter confirms that back then he could get in the car and drive himself anywhere he wanted to go, but he's not doing that now. His daughter adds "even if I gave him the car keys now, I don't think he'd go anywhere."  He indicates that his ankles feel tender.  He says that his cough  is dry.  His heart doctor is [facility]. Mr. Haueter says he thinks he sees him again in 3 months.  Mr. Magallanes states "all my life, I laid out my plans and got them done," he notes that things are different now.   Past Medical History  Diagnosis Date  . COPD (chronic obstructive pulmonary disease) (HCC)     Uses occasional nighttime O2  . Disseminated herpes zoster 2010  . GERD (gastroesophageal reflux disease)   . Asthma   . Pulmonary embolus ([place])     2003 and 2011  . Pulmonary nodules     Chest CT 09/11  . Mixed hyperlipidemia   . Chronic atrial fibrillation ([place])   . Lupus anticoagulant positive   . Essential hypertension, benign   .  Cholelithiasis   . Rheumatoid arthritis(714.0)   . History of chicken pox 1941; 2011  . Anemia   . Chronic lower back pain   . Chronic diastolic heart failure ([place])   . Cirrhosis ([place])     Questionable, AFP normal Feb 01/2012, hx of ETOH use   . Headache(784.0)   . Cervical spondylosis without myelopathy   . Stage III chronic kidney disease   . Cellulitis of leg, left 01/03/2015  . Aortic stenosis, moderate 05/21/2015  . Dysrhythmia     chronic AFib  . 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    has Essential hypertension, benign; PULMONARY EMBOLISM; Chronic atrial fibrillation ([place]); Rheumatoid arthritis ([place]); Lupus anticoagulant positive; GERD (gastroesophageal reflux disease); Cirrhosis ([place]); COPD (chronic obstructive pulmonary disease) ([place]); Long term current use of anticoagulant; Arthritis; Anemia; Adenomatous polyps; Chronic diastolic heart failure ([place]); Headache(784.0); Cervical spondylosis without myelopathy; Stiffness of joints, not elsewhere classified, multiple sites; Posture imbalance; Cellulitis of leg, left; Acute kidney injury ([place]); Thrombocytopenia ([place]); Stage III chronic kidney disease; Acute on chronic diastolic CHF (congestive heart failure) ([place]); COPD exacerbation ([place]); Aortic stenosis, moderate; Acute diastolic CHF (congestive heart failure) ([place]); Acute respiratory distress ([place]); Diffuse large B cell lymphoma ([place]); Chronic a-fib (HCC); BPH (benign prostatic hyperplasia); Atrial fibrillation with RVR ([place]); HCAP (healthcare-associated pneumonia); COPD with acute exacerbation ([place]); Recurrent right pleural effusion; Chronic diastolic CHF (congestive heart failure) ([place]); Chronic respiratory failure ([place]); and Malignant pleural effusion on his problem list.     is allergic to cymbalta; procaine hcl; and bystolic.  Current Outpatient Prescriptions on File Prior to Visit  Medication Sig Dispense Refill  . albuterol (PROAIR HFA) 108 (90 BASE) MCG/ACT inhaler Inhale  2 puffs into the lungs every 6 (six) hours as needed. Shortness of Breath 1 Inhaler 3  . allopurinol (ZYLOPRIM) 300 MG tablet Take 1 tablet (300 mg total) by mouth daily. 30 tablet 3  . cetirizine (ZYRTEC) 10 MG tablet Take 10 mg by mouth daily.      Marland Kitchen DIGOX 125 MCG tablet TAKE ONE TABLET BY MOUTH DAILY 30 tablet 6  . diltiazem (CARDIZEM CD) 240 MG 24 hr capsule Take 1 capsule (240 mg total) by mouth daily. 30 capsule 1  . ferrous sulfate 325 (65 FE) MG tablet Take 325 mg by mouth daily.    . fluticasone (FLONASE) 50 MCG/ACT nasal spray Place 2 sprays into both nostrils daily.   0  . folic acid (FOLVITE) 1 MG tablet Take 1 mg by mouth daily.     . furosemide (LASIX) 40 MG tablet Take 1 tablet (40 mg total) by mouth daily. Take daily for prevention of fluid buildup. 30 tablet   . gabapentin (NEURONTIN) 300 MG capsule  Take 2 capsules (600 mg total) by mouth 2 (two) times daily. 30 capsule 0  . guaiFENesin (MUCINEX) 600 MG 12 hr tablet Take 1 tablet (600 mg total) by mouth 2 (two) times daily. 20 tablet 0  . ipratropium-albuterol (DUONEB) 0.5-2.5 (3) MG/3ML SOLN Take 3 mLs by nebulization every 6 (six) hours as needed. (Patient taking differently: Take 3 mLs by nebulization every 6 (six) hours as needed (shortness of breath). ) 360 mL 1  . ketoconazole (NIZORAL) 2 % shampoo Apply 1 application topically 3 (three) times a week.     . lidocaine-prilocaine (EMLA) cream Apply a quarter size amount to port site 1 hour prior to chemo. Do not rub in. Cover with plastic wrap. 30 g 3  . Omega-3 Fatty Acids (FISH OIL) 1000 MG CAPS Take 2 capsules by mouth 2 (two) times daily.     Marland Kitchen omeprazole (PRILOSEC) 40 MG capsule Take 40 mg by mouth daily.    . ondansetron (ZOFRAN) 8 MG tablet Take 1 tablet every 8 hours as needed for nausea/vomiting. (Patient taking differently: Take 8 mg by mouth every 8 (eight) hours as needed for nausea or vomiting. ) 30 tablet 3  . oxyCODONE 10 MG TABS Take 1 tablet (10 mg total) by  mouth every 6 (six) hours as needed for severe pain. 30 tablet 0  . potassium chloride SA (K-DUR,KLOR-CON) 20 MEQ tablet Take 0.5 tablets (10 mEq total) by mouth daily. Starting 08/27/15.    Marland Kitchen predniSONE (DELTASONE) 20 MG tablet On Days 1-5 of chemo take 4.5 tablets (60m) daily. 30 tablet 4  . prochlorperazine (COMPAZINE) 10 MG tablet Take 1 tablet (10 mg total) by mouth every 6 (six) hours as needed (Nausea or vomiting). (Patient taking differently: Take 10 mg by mouth every 6 (six) hours as needed (Nausea or vomiting). nausea) 30 tablet 3  . Rivaroxaban (XARELTO STARTER PACK) 15 & 20 MG TBPK Take as directed on package: Start with one 159mtablet by mouth twice a day with food. On Day 22, switch to one 2062mablet once a day with food. 51 each 0  . Sennosides-Docusate Sodium (SENOKOT S PO) Take 1 tablet by mouth.    . tadalafil (CIALIS) 5 MG tablet Take 5 mg by mouth daily as needed for erectile dysfunction.     . tamsulosin (FLOMAX) 0.4 MG CAPS capsule Take 1 capsule (0.4 mg total) by mouth at bedtime. For prostate treatment. 30 capsule 3  . Vitamin D, Ergocalciferol, (DRISDOL) 50000 UNITS CAPS capsule Take 1 capsule by mouth once a week. friday  4   No current facility-administered medications on file prior to visit.    Past Surgical History  Procedure Laterality Date  . Polypectomy  02/02/2012    RMR: Multiple colonic polyps removed, flat, tubular adenomas/ Left-sided diverticulosis  . Posterior lumbar vertebrae excision  2003; 2009; 2011  . Myringotomy      "3 times; both ears"  . Cataract extraction w/ intraocular lens  implant, bilateral    . Cholecystectomy  05/23/2012    Procedure: LAPAROSCOPIC CHOLECYSTECTOMY WITH INTRAOPERATIVE CHOLANGIOGRAM;  Surgeon: FaeStark KleinD;  [facility];  Laterality: N/A;  . Esophagogastroduodenoscopy  02/02/12    RMRTZG:YFVCBSWHze hiatal hernia; otherwise normal exam  . Colonoscopy  01/24/2013    RMRQPR:FFMBWGYlyps/colonic  diverticulosis  . Back surgery    . Portacath placement    . Lymph node biopsy    . Lymph node biopsy Right 09/11/2015    Procedure:  RIGHT INGUINAL LYMPH NODE BIOPSY;  Surgeon: Aviva Signs, MD;  Location: [facility];  Service: General;  Laterality: Right;  . Portacath placement Left 09/11/2015    Procedure: INSERTION PORT-A-CATH;  Surgeon: Aviva Signs, MD;  Location: [facility];  Service: General;  Laterality: Left;    Denies any headaches, dizziness, double vision, fevers, chills, night sweats, nausea, vomiting, diarrhea, constipation, chest pain, heart palpitations, shortness of breath, blood in stool, black tarry stool, urinary pain, urinary burning, urinary frequency, hematuria. 14 point review of systems was performed and is negative except as detailed under history of present illness and above    PHYSICAL EXAMINATION  ECOG PERFORMANCE STATUS: 1 - Symptomatic but completely ambulatory  Filed Vitals:   10/12/15 0934  BP: 97/55  Pulse: 107  Temp: 98.5 F (36.9 C)  Resp: 22    GENERAL:alert, no distress, well nourished, well developed, comfortable, cooperative, and accompanied by daughter. Wearing O2. SKIN: skin color, texture, turgor are normal, no rashes or significant lesions HEAD: Normocephalic, No masses, lesions, tenderness or abnormalities EYES: normal, PERRLA, EOMI, Conjunctiva are pink and non-injected EARS: External ears normal OROPHARYNX:lips, buccal mucosa, and tongue normal and mucous membranes are moist  NECK: supple, thyroid normal size, non-tender, without nodularity, no stridor, non-tender, trachea midline LYMPH:  Right lower anterior neck adenopathy improved, axillary adenopathy on right not palpable. BREAST:not examined LUNGS: right lower lobe decreased breath sounds, otherwise clear. HEART: irregularly irregular rate & rhythm, no murmurs and no gallops ABDOMEN:abdomen soft and normal bowel sounds BACK: Back symmetric, no curvature. EXTREMITIES:less then 2 second  capillary refill, no joint deformities, effusion, or inflammation, no skin discoloration, no cyanosis  NEURO: alert & oriented x 3 with fluent speech, no focal motor/sensory deficits, gait normal   LABORATORY DATA: I have reviewed the data as listed  CBC    Component Value Date/Time   WBC 15.3* 10/12/2015 1022   RBC 3.28* 10/12/2015 1022   HGB 10.8* 10/12/2015 1022   HCT 33.4* 10/12/2015 1022   HCT 34 01/11/2012 1007   PLT 225 10/12/2015 1022   MCV 101.8* 10/12/2015 1022   MCV 92.0 01/11/2012 1007   MCH 32.9 10/12/2015 1022   MCHC 32.3 10/12/2015 1022   RDW 18.4* 10/12/2015 1022   LYMPHSABS 0.6* 10/12/2015 1022   MONOABS 0.6 10/12/2015 1022   EOSABS 0.1 10/12/2015 1022   BASOSABS 0.1 10/12/2015 1022      Chemistry      Component Value Date/Time   NA 140 10/12/2015 1022   NA 140 01/11/2012 1005   K 4.2 10/12/2015 1022   K 4.5 01/11/2012 1005   CL 101 10/12/2015 1022   CO2 32 10/12/2015 1022   BUN 11 10/12/2015 1022   BUN 12 01/11/2012 1005   CREATININE 1.30* 10/12/2015 1022   CREATININE 1.17 01/11/2012 1005      Component Value Date/Time   CALCIUM 8.4* 10/12/2015 1022   CALCIUM 9.2 01/11/2012 1005   ALKPHOS 56 10/12/2015 1022   AST 31 10/12/2015 1022   ALT 17 10/12/2015 1022   BILITOT 1.5* 10/12/2015 1022       RADIOGRAPHIC STUDIES:  Dg Chest 1 View  10/01/2015  CLINICAL DATA:  Status post right thoracentesis EXAM: CHEST  1 VIEW COMPARISON:  09/29/2015 FINDINGS: There is been significant reduction in the right-sided pleural effusion. Only minimal fluid remains. No pneumothorax is seen. A left chest wall port is again noted and stable. The cardiac shadow is stable. The left lung remains clear. IMPRESSION: No pneumothorax following thoracentesis  on the right. Electronically Signed   By: Inez Catalina M.D.   On: 10/01/2015 14:27   Dg Chest 1 View  09/15/2015  CLINICAL DATA:  Status post thoracentesis EXAM: CHEST  1 VIEW COMPARISON:  09/13/15 FINDINGS: There is  moderate cardiac enlargement. Left chest wall port a catheter is noted with tip in the projection of the SVC. Cardiac enlargement is noted. There has been decrease in volume of right pleural effusion. No significant pneumothorax identified. Similar appearance of diffuse pulmonary edema. IMPRESSION: 1. No pneumothorax after right thoracentesis. Electronically Signed   By: Kerby Moors M.D.   On: 09/15/2015 14:12   Dg Lumbar Spine Complete  09/30/2015  CLINICAL DATA:  Nonspecific low back pain and stiffness with radiation to lower extremity for several days. History of diffuse large B-cell lymphoma and rheumatoid arthritis. EXAM: LUMBAR SPINE - COMPLETE 4+ VIEW COMPARISON:  CT abdomen pelvis - 08/25/2015 ; 07/05/2013 FINDINGS: There are 5 non rib-bearing lumbar type vertebral bodies. There is a mild scoliotic curvature of the thoracolumbar spine with dominant caudal component convex to the left. Post L4-L5 paraspinal fusion without definite evidence of hardware failure or loosening. Unchanged mild (under 25%) compression deformities involving the inferior endplate of the K16 vertebral body as well as the superior endplates of the L1 and L2 vertebral bodies, grossly unchanged since the 06/2013 abdominal CT. Remaining lumbar vertebral body heights are preserved. Re- demonstrated moderate DDD of L3-L4 and L5-S1 with disc space height loss, endplate irregularity and sclerosis. Calcified atherosclerotic plaque within the abdominal aorta. Limited visualization of the bilateral SI joints is normal. Moderate colonic stool burden without evidence of enteric obstruction. Post cholecystectomy. IMPRESSION: 1. No acute findings. 2. Post L4-L5 paraspinal fusion without evidence of hardware failure or loosening. 3. Moderate DDD of L3-L4 and L5-S1. Electronically Signed   By: Sandi Mariscal M.D.   On: 09/30/2015 20:46   Nm Cardiac Muga Rest  09/17/2015  CLINICAL DATA:  Hodgkin's lymphoma. Patient to receive cardiotoxic  chemotherapy. EXAM: NUCLEAR MEDICINE CARDIAC BLOOD POOL IMAGING (MUGA) TECHNIQUE: Cardiac multi-gated acquisition was performed at rest following intravenous injection of Tc-78mlabeled red blood cells. RADIOPHARMACEUTICALS:  26.0 mCi Tc-939mDP in-vitro labeled red blood cells IV COMPARISON:  None. FINDINGS: The left ventricular ejection fraction is equal to 65.9%. There is left ventricular normal wall motion. IMPRESSION: 1. Left ventricular ejection fraction equals 65.9%. Electronically Signed   By: TaKerby Moors.D.   On: 09/17/2015 13:37   UsKoreaelvis Limited  10/05/2015  CLINICAL DATA:  Right groin mass for 2 months.  Known lymphoma. EXAM: LIMITED ULTRASOUND OF PELVIS TECHNIQUE: Limited transabdominal ultrasound examination of the pelvis was performed. COMPARISON:  CT, 08/25/2015 FINDINGS: There is an irregular fluid collection in the right groin with no internal blood flow. Echogenic septations protrude into fluid collection, but there is no obvious mural mass. Collection somewhat difficult to measure given its irregular configuration, but measures approximately 6.6 x 6.4 x 5.2 cm. IMPRESSION: 1. Right groin mass appears to be an irregular fluid collection with no convincing internal blood flow. The etiology of this is unclear. It could potentially reflect a mass or enlarged lymph node with significant internal necrosis. Alternatively, it may reflect herniated peritoneum distended with ascites. There was no hernia on the prior CT, however. Recommend follow-up contrast-enhanced CT for more comprehensive assessment of this abnormality. Electronically Signed   By: DaLajean Manes.D.   On: 10/05/2015 14:17   Us[place]enous Img Lower Unilateral Right  10/01/2015  CLINICAL DATA:  Right leg swelling x2 days. pain. lymphoma. previous tobacco abuse. EXAM: RIGHT LOWER EXTREMITY VENOUS DOPPLER ULTRASOUND TECHNIQUE: Gray-scale sonography with compression, as well as color and duplex ultrasound, were performed to  evaluate the deep venous system from the level of the common femoral vein through the popliteal and proximal calf veins. COMPARISON:  None FINDINGS: Normal compressibility of the common femoral, superficial femoral, and popliteal veins, as well as the proximal calf veins. No filling defects to suggest DVT on grayscale or color Doppler imaging. Doppler waveforms show normal direction of venous flow, normal respiratory phasicity and response to augmentation. Subcutaneous edema in the calf. Cystic right inguinal adenopathy partially visualized. Survey views of the contralateral common femoral vein are unremarkable. IMPRESSION: 1. No evidence of lower extremity deep vein thrombosis, RIGHT. Electronically Signed   By: Lucrezia Europe M.D.   On: 10/01/2015 16:40   Ct Biopsy  09/18/2015  INDICATION: History of non-Hodgkin's lymphoma. Please perform CT-guided biopsy for tissue diagnostic purposes. EXAM: CT-GUIDED BONE MARROW BIOPSY AND ASPIRATION MEDICATIONS: Fentanyl 50 mcg IV; Versed 3 mg IV ANESTHESIA/SEDATION: Sedation Time 10 minutes CONTRAST:  None COMPLICATIONS: None immediate. PROCEDURE: Informed consent was obtained from the patient following an explanation of the procedure, risks, benefits and alternatives. The patient understands, agrees and consents for the procedure. All questions were addressed. A time out was performed prior to the initiation of the procedure. The patient was positioned prone and non-contrast localization CT was performed of the pelvis to demonstrate the iliac marrow spaces. The operative site was prepped and draped in the usual sterile fashion. Under sterile conditions and local anesthesia, a 22 gauge spinal needle was utilized for procedural planning. Next, an 11 gauge coaxial bone biopsy needle was advanced into the left iliac marrow space. Needle position was confirmed with CT imaging. Initially, bone marrow aspiration was performed. Next, a bone marrow biopsy was obtained with the 11 gauge  outer bone marrow device. Samples were prepared with the cytotechnologist and deemed adequate. The needle was removed intact. Hemostasis was obtained with compression and a dressing was placed. The patient tolerated the procedure well without immediate post procedural complication. IMPRESSION: Successful CT guided left iliac bone marrow aspiration and core biopsies. Electronically Signed   By: Sandi Mariscal M.D.   On: 09/18/2015 14:10   Dg Chest Port 1 View  09/29/2015  CLINICAL DATA:  Shortness of breath and chest pain.  B-cell lymphoma EXAM: PORTABLE CHEST 1 VIEW COMPARISON:  September 15, 2015 FINDINGS: There is a moderate right pleural effusion with right middle and lower lobe airspace consolidations/atelectasis. Elsewhere, the interstitium is somewhat diffusely prominent. Heart is upper normal in size with pulmonary vascularity within normal limits. Port-A-Cath tip is in the superior vena cava just beyond the junction with the left innominate vein. No pneumothorax. No bone lesions. There is atherosclerotic change in the aorta. IMPRESSION: Right effusion with right middle and lower lobe atelectasis/consolidation. Interstitium is prominent in a generalized manner; question chronic inflammatory type change versus mild chronic congestive heart failure. Electronically Signed   By: Lowella Grip III M.D.   On: 09/29/2015 10:35   Dg Chest Port 1 View  09/13/2015  CLINICAL DATA:  Pt states woke up during the night with increased SOB, increased home 02, cough, thick sputum, ankles swollen bilaterally EXAM: PORTABLE CHEST - 1 VIEW COMPARISON:  09/11/2015, 09/09/2015 FINDINGS: Large right pleural effusion with underlying opacity. Mild cardiac enlargement. Coarsened lung markings. Known metastatic disease. IMPRESSION: Large right pleural effusion with middle  and lower lobe consolidation on the right. No change from recent prior studies. Electronically Signed   By: Skipper Cliche M.D.   On: 09/13/2015 07:36   US  Thoracentesis Asp Pleural Space W/img Guide  10/01/2015  CLINICAL DATA:  Recurrent RIGHT pleural effusion EXAM: ULTRASOUND GUIDED DIAGNOSTIC AND THERAPEUTIC THORACENTESIS COMPARISON:  09/15/2015 PROCEDURE: Procedure, benefits, and risks of procedure were discussed with patient. Written informed consent for procedure was obtained. Time out protocol followed. Pleural effusion localized by ultrasound at the posterior RIGHT hemithorax. Skin prepped and draped in usual sterile fashion. Skin and soft tissues anesthetized with 8 mL of 1% lidocaine. 8 French thoracentesis catheter placed into the RIGHT pleural space. 1500 mL of yellow RIGHT pleural fluid aspirated by syringe pump. Procedure tolerated well by patient without immediate complication. COMPLICATIONS: None FINDINGS: A total of approximately 1500 mL of RIGHT pleural fluid was removed. A fluid sample of 180 mL wassent for laboratory analysis. IMPRESSION: Successful ultrasound guided RIGHT thoracentesis yielding 1500 mL of RIGHT pleural fluid. Electronically Signed   By: Lavonia Dana M.D.   On: 10/01/2015 15:27   US Thoracentesis Asp Pleural Space W/img Guide  09/15/2015  CLINICAL DATA:  Recurrent right pleural effusion EXAM: ULTRASOUND GUIDED RIGHT THORACENTESIS COMPARISON:  None. PROCEDURE: An ultrasound guided thoracentesis was thoroughly discussed with the patient and questions answered. The benefits, risks, alternatives and complications were also discussed. The patient understands and wishes to proceed with the procedure. Written consent was obtained. Ultrasound was performed to localize and mark an adequate pocket of fluid in the right posterior chest. The area was then prepped and draped in the normal sterile fashion. 1% Lidocaine was used for local anesthesia. Under ultrasound guidance a 19 gauge Yueh catheter was introduced. Thoracentesis was performed. The catheter was removed and a dressing applied. COMPLICATIONS: None. FINDINGS: A total of  approximately 2 L of amber fluid was removed. A fluid sample wassent for laboratory analysis. IMPRESSION: Successful ultrasound guided right thoracentesis yielding 2 L of pleural fluid. Electronically Signed   By: Rolm Baptise M.D.   On: 09/15/2015 14:48     PATHOLOGY:  Diagnosis PLEURAL FLUID, RIGHT(SPECIMEN 1 OF 1 COLLECTED 08/24/15): ATYPICAL LYMPHOCYTES SUSPICIOUS FOR A LYMPHOPROLIFERATIVE PROCESS, SEE COMMENT. [name] Pathologist, Electronic Signature (Case signed 08/28/2015)          ASSESSMENT AND PLAN:  DLBCL, Stage IV LDH 242 (3-23 U/L) IPI of 3 High -intermediate Risk with 5 years OS 43%, CR of 55% Rheumatoid arthritis, Enbrel and MTX for "many years" PET/CT with widespread metastatic adenopathy including the right cervical lymph nodes, left axillary lymph nodes, mediastinal lymph nodes, hilar, retroperitoneal lymph nodes and iliac lymph nodes. Inguinal lymph nodes may be most accessible for biopsy. Hypermetabolic pleural thickening in discrete nodules within the lungs consistent with metastatic disease, right infrahilar mass versus adenopathy, hypermetabolic thickening through the gastric antrum/pyloric region, moderate right effusion Macrocytosis  Lupus Anticoagulant BMBX worrisome for minimal involvement with DLBCL  We spent time today discussing other options including palliative therapy with drugs like Rituxan only or Rituxan/Revlimid. We also addressed end of life issues and the patient notes he has not openly discussed these with his family. We did discuss DNR status today and he is now a DNR.   He has a seroma in the R groin. I advised him I could refer him back to Dr. Arnoldo Morale for management if it is causing him discomfort.  I have opted to give him Rituxan only today. I discussed with the  patient that the goal of therapy is not to make him worse, although DLBCL is a curable disease he simply may not be able to tolerate R mini CHOP (ie. Curative therapy) and  we have other alternatives. We discussed hospice.   I sent his bone marrow biopsy to [facility] for another opinion. Before we take further steps, we will obtain this second opinion. Bone marrow involvement would entail IT chemotherapy and I do not feel he could tolerate that.  I believe he needs to go on hospice, and we will address this next time.  All questions were answered. The patient knows to call the clinic with any problems, questions or concerns. We can certainly see the patient much sooner if necessary.  This document serves as a record of services personally performed by Ancil Linsey, MD. It was created on her behalf by Toni Amend, a trained medical scribe. The creation of this record is based on the scribe's personal observations and the provider's statements to them. This document has been checked and approved by the attending provider.  I have reviewed the above documentation for accuracy and completeness, and I agree with the above.  This note is electronically signed [id number] Cyril Mourning, MD  10/12/2015 3:11 PM

## 2015-10-12 NOTE — Patient Instructions (Signed)
[facility] Discharge Instructions for Patients Receiving Chemotherapy  Today you received the following chemotherapy agent: Rituxan.      If you develop nausea and vomiting, or diarrhea that is not controlled by your medication, call the clinic.  The clinic phone number is [phone]. Office hours are Monday-Friday 8:30am-5:00pm.  BELOW ARE SYMPTOMS THAT SHOULD BE REPORTED IMMEDIATELY:  *FEVER GREATER THAN 101.0 F  *CHILLS WITH OR WITHOUT FEVER  NAUSEA AND VOMITING THAT IS NOT CONTROLLED WITH YOUR NAUSEA MEDICATION  *UNUSUAL SHORTNESS OF BREATH  *UNUSUAL BRUISING OR BLEEDING  TENDERNESS IN MOUTH AND THROAT WITH OR WITHOUT PRESENCE OF ULCERS  *URINARY PROBLEMS  *BOWEL PROBLEMS  UNUSUAL RASH Items with * indicate a potential emergency and should be followed up as soon as possible. If you have an emergency after office hours please contact your primary care physician or go to the nearest emergency department.  Please call the clinic during office hours if you have any questions or concerns.   You may also contact the Patient Navigator at [phone] should you have any questions or need assistance in obtaining follow up care. _____________________________________________________________________ Have you asked about our STAR program?    STAR stands for Survivorship Training and Rehabilitation, and this is a nationally recognized cancer care program that focuses on survivorship and rehabilitation.  Cancer and cancer treatments may cause problems, such as, pain, making you feel tired and keeping you from doing the things that you need or want to do. Cancer rehabilitation can help. Our goal is to reduce these troubling effects and help you have the best quality of life possible.  You may receive a survey from a nurse that asks questions about your current state of health.  Based on the survey results, all eligible patients will be referred to the [facility] program for  an evaluation so we can better serve you! A frequently asked questions sheet is available upon request.

## 2015-10-12 NOTE — Patient Instructions (Signed)
[place] at [facility] Discharge Instructions  RECOMMENDATIONS MADE BY THE CONSULTANT AND ANY TEST RESULTS WILL BE SENT TO YOUR REFERRING PHYSICIAN.  Exam done and seen by Dr. Whitney Muse. Follow up in 3weeks   Thank you for choosing [place] at [facility] to provide your oncology and hematology care.  To afford each patient quality time with our provider, please arrive at least 15 minutes before your scheduled appointment time.    You need to re-schedule your appointment should you arrive 10 or more minutes late.  We strive to give you quality time with our providers, and arriving late affects you and other patients whose appointments are after yours.  Also, if you no show three or more times for appointments you may be dismissed from the clinic at the providers discretion.     Again, thank you for choosing [facility].  Our hope is that these requests will decrease the amount of time that you wait before being seen by our physicians.       _____________________________________________________________  Should you have questions after your visit to [facility], please contact our office at [phone] between the hours of 8:30 a.m. and 4:30 p.m.  Voicemails left after 4:30 p.m. will not be returned until the following business day.  For prescription refill requests, have your pharmacy contact our office.

## 2015-10-13 ENCOUNTER — Inpatient Hospital Stay (HOSPITAL_COMMUNITY): Payer: Medicare Other

## 2015-10-18 ENCOUNTER — Other Ambulatory Visit: Payer: Self-pay

## 2015-10-18 ENCOUNTER — Inpatient Hospital Stay (HOSPITAL_COMMUNITY)
Admission: EM | Admit: 2015-10-18 | Discharge: 2015-10-21 | DRG: 101 | Disposition: A | Discharge status: Home Health | Payer: Medicare Other | Attending: Family Medicine | Admitting: Family Medicine

## 2015-10-18 ENCOUNTER — Encounter (HOSPITAL_COMMUNITY): Payer: Self-pay | Admitting: Emergency Medicine

## 2015-10-18 ENCOUNTER — Emergency Department (HOSPITAL_COMMUNITY): Payer: Medicare Other

## 2015-10-18 ENCOUNTER — Observation Stay (HOSPITAL_COMMUNITY): Payer: Medicare Other

## 2015-10-18 DIAGNOSIS — Z833 Family history of diabetes mellitus: Secondary | ICD-10-CM | POA: Diagnosis not present

## 2015-10-18 DIAGNOSIS — Z9221 Personal history of antineoplastic chemotherapy: Secondary | ICD-10-CM

## 2015-10-18 DIAGNOSIS — R569 Unspecified convulsions: Secondary | ICD-10-CM | POA: Diagnosis not present

## 2015-10-18 DIAGNOSIS — N183 Chronic kidney disease, stage 3 unspecified: Secondary | ICD-10-CM | POA: Diagnosis present

## 2015-10-18 DIAGNOSIS — Z9119 Patient's noncompliance with other medical treatment and regimen: Secondary | ICD-10-CM | POA: Diagnosis not present

## 2015-10-18 DIAGNOSIS — I130 Hypertensive heart and chronic kidney disease with heart failure and stage 1 through stage 4 chronic kidney disease, or unspecified chronic kidney disease: Secondary | ICD-10-CM | POA: Diagnosis present

## 2015-10-18 DIAGNOSIS — IMO0002 Reserved for concepts with insufficient information to code with codable children: Secondary | ICD-10-CM

## 2015-10-18 DIAGNOSIS — G9340 Encephalopathy, unspecified: Secondary | ICD-10-CM | POA: Diagnosis present

## 2015-10-18 DIAGNOSIS — M25561 Pain in right knee: Secondary | ICD-10-CM | POA: Diagnosis present

## 2015-10-18 DIAGNOSIS — Z7901 Long term (current) use of anticoagulants: Secondary | ICD-10-CM

## 2015-10-18 DIAGNOSIS — J449 Chronic obstructive pulmonary disease, unspecified: Secondary | ICD-10-CM | POA: Diagnosis present

## 2015-10-18 DIAGNOSIS — Z86711 Personal history of pulmonary embolism: Secondary | ICD-10-CM | POA: Diagnosis not present

## 2015-10-18 DIAGNOSIS — R4182 Altered mental status, unspecified: Secondary | ICD-10-CM

## 2015-10-18 DIAGNOSIS — C8330 Diffuse large B-cell lymphoma, unspecified site: Secondary | ICD-10-CM | POA: Diagnosis not present

## 2015-10-18 DIAGNOSIS — D630 Anemia in neoplastic disease: Secondary | ICD-10-CM | POA: Diagnosis present

## 2015-10-18 DIAGNOSIS — J90 Pleural effusion, not elsewhere classified: Secondary | ICD-10-CM | POA: Diagnosis not present

## 2015-10-18 DIAGNOSIS — E782 Mixed hyperlipidemia: Secondary | ICD-10-CM | POA: Diagnosis present

## 2015-10-18 DIAGNOSIS — G40909 Epilepsy, unspecified, not intractable, without status epilepticus: Principal | ICD-10-CM | POA: Diagnosis present

## 2015-10-18 DIAGNOSIS — Z9981 Dependence on supplemental oxygen: Secondary | ICD-10-CM | POA: Diagnosis not present

## 2015-10-18 DIAGNOSIS — J9611 Chronic respiratory failure with hypoxia: Secondary | ICD-10-CM

## 2015-10-18 DIAGNOSIS — K7460 Unspecified cirrhosis of liver: Secondary | ICD-10-CM | POA: Diagnosis present

## 2015-10-18 DIAGNOSIS — L03314 Cellulitis of groin: Secondary | ICD-10-CM

## 2015-10-18 DIAGNOSIS — Z66 Do not resuscitate: Secondary | ICD-10-CM | POA: Diagnosis present

## 2015-10-18 DIAGNOSIS — T148 Other injury of unspecified body region: Secondary | ICD-10-CM | POA: Diagnosis not present

## 2015-10-18 DIAGNOSIS — M069 Rheumatoid arthritis, unspecified: Secondary | ICD-10-CM | POA: Diagnosis present

## 2015-10-18 DIAGNOSIS — J910 Malignant pleural effusion: Secondary | ICD-10-CM | POA: Diagnosis present

## 2015-10-18 DIAGNOSIS — I5032 Chronic diastolic (congestive) heart failure: Secondary | ICD-10-CM | POA: Diagnosis not present

## 2015-10-18 DIAGNOSIS — D72829 Elevated white blood cell count, unspecified: Secondary | ICD-10-CM | POA: Diagnosis present

## 2015-10-18 DIAGNOSIS — I482 Chronic atrial fibrillation, unspecified: Secondary | ICD-10-CM | POA: Diagnosis present

## 2015-10-18 DIAGNOSIS — C8338 Diffuse large B-cell lymphoma, lymph nodes of multiple sites: Secondary | ICD-10-CM | POA: Diagnosis present

## 2015-10-18 DIAGNOSIS — Z87891 Personal history of nicotine dependence: Secondary | ICD-10-CM | POA: Diagnosis not present

## 2015-10-18 DIAGNOSIS — M25569 Pain in unspecified knee: Secondary | ICD-10-CM

## 2015-10-18 LAB — BLOOD GAS, ARTERIAL
Acid-Base Excess: 9 mmol/L — ABNORMAL HIGH (ref 0.0–2.0)
BICARBONATE: 32.1 meq/L — AB (ref 20.0–24.0)
Drawn by: 23534
FIO2: 0.21
O2 CONTENT: 21 L/min
O2 SAT: 87.8 %
PCO2 ART: 48 mmHg — AB (ref 35.0–45.0)
PO2 ART: 54.7 mmHg — AB (ref 80.0–100.0)
pH, Arterial: 7.455 — ABNORMAL HIGH (ref 7.350–7.450)

## 2015-10-18 LAB — PROTIME-INR
INR: 1.48 (ref 0.00–1.49)
Prothrombin Time: 18 seconds — ABNORMAL HIGH (ref 11.6–15.2)

## 2015-10-18 LAB — COMPREHENSIVE METABOLIC PANEL
ALT: 35 U/L (ref 17–63)
ANION GAP: 6 (ref 5–15)
AST: 40 U/L (ref 15–41)
Albumin: 2.9 g/dL — ABNORMAL LOW (ref 3.5–5.0)
Alkaline Phosphatase: 75 U/L (ref 38–126)
BUN: 18 mg/dL (ref 6–20)
CHLORIDE: 104 mmol/L (ref 101–111)
CO2: 32 mmol/L (ref 22–32)
CREATININE: 1.03 mg/dL (ref 0.61–1.24)
Calcium: 8.8 mg/dL — ABNORMAL LOW (ref 8.9–10.3)
Glucose, Bld: 95 mg/dL (ref 65–99)
Potassium: 4.6 mmol/L (ref 3.5–5.1)
SODIUM: 142 mmol/L (ref 135–145)
Total Bilirubin: 0.8 mg/dL (ref 0.3–1.2)
Total Protein: 6 g/dL — ABNORMAL LOW (ref 6.5–8.1)

## 2015-10-18 LAB — I-STAT CG4 LACTIC ACID, ED
LACTIC ACID, VENOUS: 1.9 mmol/L (ref 0.5–2.0)
LACTIC ACID, VENOUS: 1.2 mmol/L (ref 0.5–2.0)
Lactic Acid, Venous: 1.32 mmol/L (ref 0.5–2.0)

## 2015-10-18 LAB — CBC
HCT: 39.6 % (ref 39.0–52.0)
HEMOGLOBIN: 12.5 g/dL — AB (ref 13.0–17.0)
MCH: 32.1 pg (ref 26.0–34.0)
MCHC: 31.6 g/dL (ref 30.0–36.0)
MCV: 101.8 fL — AB (ref 78.0–100.0)
PLATELETS: 311 10*3/uL (ref 150–400)
RBC: 3.89 MIL/uL — AB (ref 4.22–5.81)
RDW: 17.7 % — ABNORMAL HIGH (ref 11.5–15.5)
WBC: 24.7 10*3/uL — AB (ref 4.0–10.5)

## 2015-10-18 LAB — URINALYSIS, ROUTINE W REFLEX MICROSCOPIC
Bilirubin Urine: NEGATIVE
Glucose, UA: NEGATIVE mg/dL
Hgb urine dipstick: NEGATIVE
KETONES UR: NEGATIVE mg/dL
LEUKOCYTES UA: NEGATIVE
NITRITE: NEGATIVE
PROTEIN: NEGATIVE mg/dL
Specific Gravity, Urine: 1.01 (ref 1.005–1.030)
Urobilinogen, UA: 0.2 mg/dL (ref 0.0–1.0)
pH: 7 (ref 5.0–8.0)

## 2015-10-18 LAB — TROPONIN I: TROPONIN I: 0.03 ng/mL (ref <0.031)

## 2015-10-18 LAB — AMMONIA

## 2015-10-18 MED ORDER — TAMSULOSIN HCL 0.4 MG PO CAPS
0.4000 mg | ORAL_CAPSULE | Freq: Every day | ORAL | Status: DC
  Administered 2015-10-18 – 2015-10-20 (×3): 0.4 mg via ORAL
  Filled 2015-10-18 (×3): qty 1

## 2015-10-18 MED ORDER — RIVAROXABAN 15 MG PO TABS
15.0000 mg | ORAL_TABLET | Freq: Two times a day (BID) | ORAL | Status: DC
  Administered 2015-10-18 – 2015-10-21 (×7): 15 mg via ORAL
  Filled 2015-10-18 (×7): qty 1

## 2015-10-18 MED ORDER — ONDANSETRON HCL 4 MG PO TABS: 4.0000 mg | ORAL_TABLET | Freq: Four times a day (QID) | ORAL | Status: DC | PRN

## 2015-10-18 MED ORDER — ALUM & MAG HYDROXIDE-SIMETH 200-200-20 MG/5ML PO SUSP: 30.0000 mL | Freq: Four times a day (QID) | ORAL | Status: DC | PRN

## 2015-10-18 MED ORDER — DILTIAZEM HCL ER COATED BEADS 240 MG PO CP24
240.0000 mg | ORAL_CAPSULE | Freq: Every day | ORAL | Status: DC
  Administered 2015-10-18 – 2015-10-21 (×4): 240 mg via ORAL
  Filled 2015-10-18 (×4): qty 1

## 2015-10-18 MED ORDER — ALLOPURINOL 300 MG PO TABS
300.0000 mg | ORAL_TABLET | Freq: Every day | ORAL | Status: DC
  Administered 2015-10-18 – 2015-10-21 (×4): 300 mg via ORAL
  Filled 2015-10-18 (×4): qty 1

## 2015-10-18 MED ORDER — HYDRALAZINE HCL 20 MG/ML IJ SOLN: 5.0000 mg | INTRAMUSCULAR | Status: DC | PRN

## 2015-10-18 MED ORDER — SODIUM CHLORIDE 0.9 % IV SOLN
Freq: Once | INTRAVENOUS | Status: AC
  Administered 2015-10-18: 16:00:00 via INTRAVENOUS

## 2015-10-18 MED ORDER — DIGOXIN 125 MCG PO TABS: 125.0000 ug | ORAL_TABLET | Freq: Every day | ORAL | Status: DC

## 2015-10-18 MED ORDER — FOLIC ACID 1 MG PO TABS
1.0000 mg | ORAL_TABLET | Freq: Every day | ORAL | Status: DC
  Administered 2015-10-18 – 2015-10-21 (×4): 1 mg via ORAL
  Filled 2015-10-18 (×3): qty 1

## 2015-10-18 MED ORDER — CETYLPYRIDINIUM CHLORIDE 0.05 % MT LIQD
7.0000 mL | Freq: Two times a day (BID) | OROMUCOSAL | Status: DC
  Administered 2015-10-19 – 2015-10-21 (×3): 7 mL via OROMUCOSAL

## 2015-10-18 MED ORDER — ACETAMINOPHEN 325 MG PO TABS
650.0000 mg | ORAL_TABLET | Freq: Four times a day (QID) | ORAL | Status: DC | PRN
  Filled 2015-10-18: qty 2

## 2015-10-18 MED ORDER — LORATADINE 10 MG PO TABS
10.0000 mg | ORAL_TABLET | Freq: Every day | ORAL | Status: DC
  Administered 2015-10-18 – 2015-10-21 (×4): 10 mg via ORAL
  Filled 2015-10-18 (×4): qty 1

## 2015-10-18 MED ORDER — OXYCODONE HCL 5 MG PO TABS
5.0000 mg | ORAL_TABLET | ORAL | Status: DC | PRN
  Administered 2015-10-18: 10 mg via ORAL
  Administered 2015-10-19 – 2015-10-20 (×5): 5 mg via ORAL
  Administered 2015-10-20 – 2015-10-21 (×3): 10 mg via ORAL
  Filled 2015-10-18: qty 1
  Filled 2015-10-18 (×3): qty 2
  Filled 2015-10-18 (×3): qty 1
  Filled 2015-10-18: qty 2
  Filled 2015-10-18: qty 1

## 2015-10-18 MED ORDER — SENNA 8.6 MG PO TABS
1.0000 | ORAL_TABLET | Freq: Every day | ORAL | Status: DC
  Administered 2015-10-18 – 2015-10-20 (×3): 8.6 mg via ORAL
  Filled 2015-10-18 (×3): qty 1

## 2015-10-18 MED ORDER — DILTIAZEM HCL 25 MG/5ML IV SOLN
10.0000 mg | Freq: Once | INTRAVENOUS | Status: AC
  Administered 2015-10-18: 10 mg via INTRAVENOUS
  Filled 2015-10-18: qty 5

## 2015-10-18 MED ORDER — SODIUM CHLORIDE 0.9 % IV SOLN
INTRAVENOUS | Status: AC
  Administered 2015-10-19: 16:00:00 via INTRAVENOUS

## 2015-10-18 MED ORDER — POTASSIUM CHLORIDE CRYS ER 10 MEQ PO TBCR
10.0000 meq | EXTENDED_RELEASE_TABLET | Freq: Every day | ORAL | Status: DC
  Administered 2015-10-18 – 2015-10-21 (×4): 10 meq via ORAL
  Filled 2015-10-18 (×4): qty 1

## 2015-10-18 MED ORDER — LEVALBUTEROL HCL 0.63 MG/3ML IN NEBU
0.6300 mg | INHALATION_SOLUTION | Freq: Three times a day (TID) | RESPIRATORY_TRACT | Status: DC
  Administered 2015-10-18 – 2015-10-21 (×9): 0.63 mg via RESPIRATORY_TRACT
  Filled 2015-10-18 (×8): qty 3

## 2015-10-18 MED ORDER — VANCOMYCIN HCL IN DEXTROSE 1-5 GM/200ML-% IV SOLN
1000.0000 mg | Freq: Once | INTRAVENOUS | Status: AC
  Administered 2015-10-18: 1000 mg via INTRAVENOUS
  Filled 2015-10-18: qty 200

## 2015-10-18 MED ORDER — RIVAROXABAN 15 MG PO TABS: 15.0000 mg | ORAL_TABLET | Freq: Two times a day (BID) | ORAL | Status: DC

## 2015-10-18 MED ORDER — ONDANSETRON HCL 4 MG/2ML IJ SOLN: 4.0000 mg | Freq: Three times a day (TID) | INTRAMUSCULAR | Status: AC | PRN

## 2015-10-18 MED ORDER — ACETAMINOPHEN 650 MG RE SUPP: 650.0000 mg | Freq: Four times a day (QID) | RECTAL | Status: DC | PRN

## 2015-10-18 MED ORDER — FUROSEMIDE 40 MG PO TABS
40.0000 mg | ORAL_TABLET | Freq: Every day | ORAL | Status: DC
Start: 2015-10-18 — End: 2015-10-21
  Administered 2015-10-18 – 2015-10-21 (×4): 40 mg via ORAL
  Filled 2015-10-18 (×4): qty 1

## 2015-10-18 MED ORDER — SODIUM CHLORIDE 0.9 % IV SOLN
1500.0000 mg | INTRAVENOUS | Status: DC
  Administered 2015-10-20: 1500 mg via INTRAVENOUS
  Filled 2015-10-18 (×3): qty 1500

## 2015-10-18 MED ORDER — ONDANSETRON HCL 4 MG/2ML IJ SOLN: 4.0000 mg | Freq: Four times a day (QID) | INTRAMUSCULAR | Status: DC | PRN

## 2015-10-18 MED ORDER — SODIUM CHLORIDE 0.9 % IV SOLN: INTRAVENOUS | Status: AC

## 2015-10-18 MED ORDER — VANCOMYCIN HCL 500 MG IV SOLR
500.0000 mg | Freq: Once | INTRAVENOUS | Status: AC
  Administered 2015-10-18: 500 mg via INTRAVENOUS
  Filled 2015-10-18: qty 500

## 2015-10-18 MED ORDER — GUAIFENESIN-DM 100-10 MG/5ML PO SYRP: 5.0000 mL | ORAL_SOLUTION | ORAL | Status: DC | PRN

## 2015-10-18 MED ORDER — GABAPENTIN 300 MG PO CAPS
600.0000 mg | ORAL_CAPSULE | Freq: Two times a day (BID) | ORAL | Status: DC
  Administered 2015-10-18 – 2015-10-21 (×6): 600 mg via ORAL
  Filled 2015-10-18 (×6): qty 2

## 2015-10-18 MED ORDER — SODIUM CHLORIDE 0.9 % IV BOLUS (SEPSIS)
1000.0000 mL | Freq: Once | INTRAVENOUS | Status: AC
  Administered 2015-10-18: 1000 mL via INTRAVENOUS

## 2015-10-18 MED ORDER — FERROUS SULFATE 325 (65 FE) MG PO TABS
325.0000 mg | ORAL_TABLET | Freq: Every day | ORAL | Status: DC
  Administered 2015-10-18 – 2015-10-21 (×4): 325 mg via ORAL
  Filled 2015-10-18 (×4): qty 1

## 2015-10-18 MED ORDER — DIGOXIN 125 MCG PO TABS
0.1250 mg | ORAL_TABLET | Freq: Every day | ORAL | Status: DC
  Administered 2015-10-18 – 2015-10-21 (×4): 0.125 mg via ORAL
  Filled 2015-10-18 (×4): qty 1

## 2015-10-18 MED ORDER — VANCOMYCIN HCL 500 MG IV SOLR
INTRAVENOUS | Status: AC
  Filled 2015-10-18: qty 500

## 2015-10-18 MED ORDER — RIVAROXABAN 20 MG PO TABS: 20.0000 mg | ORAL_TABLET | Freq: Every day | ORAL | Status: DC

## 2015-10-18 MED ORDER — PANTOPRAZOLE SODIUM 40 MG PO TBEC
40.0000 mg | DELAYED_RELEASE_TABLET | Freq: Every day | ORAL | Status: DC
  Administered 2015-10-18 – 2015-10-21 (×4): 40 mg via ORAL
  Filled 2015-10-18 (×4): qty 1

## 2015-10-18 NOTE — Progress Notes (Signed)
ANTIBIOTIC CONSULT NOTE - INITIAL  Pharmacy Consult for Vancomycin Indication: cellulitis  Allergies  Allergen Reactions  . Cymbalta [Duloxetine Hcl] Other (See Comments)    Confusion   . Procaine Hcl Hives and Other (See Comments)    NOVOCAINE: Sweating, Confusion, Not in right state of mind.  [name]    unknown    Patient Measurements: Height: 5\' 8"  (172.7 cm) Weight: 188 lb 1.6 oz (85.322 kg) IBW/kg (Calculated) : 68.4 Adjusted Body Weight:   Vital Signs: Temp: 98.7 F (37.1 C) (10/23 1655) Temp Source: Oral (10/23 1655) BP: 147/63 mmHg (10/23 1844) Pulse Rate: 109 (10/23 1450) Intake/Output from previous day:   Intake/Output from this shift:    Labs:  Recent Labs  10/18/15 1130  WBC 24.7*  HGB 12.5*  PLT 311  CREATININE 1.03   Estimated Creatinine Clearance: 58.8 mL/min (by C-G formula based on Cr of 1.03). No results for input(s): VANCOTROUGH, VANCOPEAK, VANCORANDOM, GENTTROUGH, GENTPEAK, GENTRANDOM, TOBRATROUGH, TOBRAPEAK, TOBRARND, AMIKACINPEAK, AMIKACINTROU, AMIKACIN in the last 72 hours.   Microbiology: Recent Results (from the past 720 hour(s))  Blood culture (routine x 2)     Status: None (Preliminary result)   Collection Time: 10/18/15  1:00 PM  Result Value Ref Range Status   Specimen Description LEFT ANTECUBITAL  Final   Special Requests BOTTLES DRAWN AEROBIC AND ANAEROBIC 6CC  Final   Culture NO GROWTH < 12 HOURS  Final   Report Status PENDING  Incomplete  Blood culture (routine x 2)     Status: None (Preliminary result)   Collection Time: 10/18/15  1:05 PM  Result Value Ref Range Status   Specimen Description BLOOD LEFT FOREARM  Final   Special Requests BOTTLES DRAWN AEROBIC AND ANAEROBIC 6CC   Final   Culture NO GROWTH < 12 HOURS  Final   Report Status PENDING  Incomplete    Medical History: Past Medical History  Diagnosis Date  . COPD (chronic obstructive pulmonary disease) (HCC)     Uses  occasional nighttime O2  . Disseminated herpes zoster 2010  . GERD (gastroesophageal reflux disease)   . Asthma   . Pulmonary embolus ([place])     2003 and 2011  . Pulmonary nodules     Chest CT 09/11  . Mixed hyperlipidemia   . Chronic atrial fibrillation ([place])   . Lupus anticoagulant positive   . Essential hypertension, benign   . Cholelithiasis   . Rheumatoid arthritis(714.0)   . History of chicken pox 1941; 2011  . Anemia   . Chronic lower back pain   . Chronic diastolic heart failure ([place])   . Cirrhosis ([place])     Questionable, AFP normal Feb 01/2012, hx of ETOH use   . Headache(784.0)   . Cervical spondylosis without myelopathy   . Stage III chronic kidney disease   . Cellulitis of leg, left 01/03/2015  . Aortic stenosis, moderate 05/21/2015  . Dysrhythmia     chronic AFib  . 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 sodium chloride   Intravenous STAT  . allopurinol  300 mg Oral Daily  . antiseptic oral rinse  7 mL Mouth Rinse BID  . digoxin  0.125 mg Oral Daily  . diltiazem  240 mg Oral Daily  . ferrous sulfate  325 mg Oral Daily  . folic acid  1 mg Oral Daily  . furosemide  40 mg Oral Daily  . gabapentin  600 mg  Oral BID  . levalbuterol  0.63 mg Nebulization Q8H  . loratadine  10 mg Oral Daily  . pantoprazole  40 mg Oral Daily  . potassium chloride SA  10 mEq Oral Daily  . rivaroxaban  15 mg Oral BID WC  . senna  1 tablet Oral QHS  . tamsulosin  0.4 mg Oral QHS  . [START ON 10/19/2015] vancomycin  1,500 mg Intravenous Q24H  . vancomycin  500 mg Intravenous Once   Assessment: Right groin cellulitis/seroma. Groin lymph note biopsied recently. Draining over the past week per family. Vancomycin 1 GM IV given in ED  Goal of Therapy:  Vancomycin trough level 10-15 mcg/ml  Plan:  Vancomycin 500 mg IV x 1 dose tonight, for total Vancomycin 1500 mg dose today Vancomycin 1500 mg IV every 24 hours starting tomorrow Vancomycin trough  at steady state Labs per protocol  Abner Greenspan, Jadyn Brasher Bennett 10/18/2015,7:20 PM

## 2015-10-18 NOTE — ED Notes (Signed)
Patient brought in by family states "my sister found him this morning sitting in the bathroom on the toilet and he had urinated on himself. He told her to get the hell out of there. He was peeking out of the door and closing the door when someone would come up." States he has been disoriented x 2 weeks but worse this morning. States he is cursing out family members today. Patient alert and oriented at triage. States "I'm being railroaded, there is nothing wrong with me." Patient denies pain at triage.

## 2015-10-18 NOTE — Progress Notes (Signed)
ANTICOAGULATION CONSULT NOTE - Initial Consult  Pharmacy Consult for Xarelto Indication: pulmonary embolus  Allergies  Allergen Reactions  . Cymbalta [Duloxetine Hcl] Other (See Comments)    Confusion   . Procaine Hcl Hives and Other (See Comments)    NOVOCAINE: Sweating, Confusion, Not in right state of mind.  [name]    unknown    Patient Measurements: Height: 5\' 8"  (172.7 cm) Weight: 188 lb 1.6 oz (85.322 kg) IBW/kg (Calculated) : 68.4 Heparin Dosing Weight:   Vital Signs: Temp: 98.7 F (37.1 C) (10/23 1655) Temp Source: Oral (10/23 1655) BP: 147/63 mmHg (10/23 1844) Pulse Rate: 109 (10/23 1450)  Labs:  Recent Labs  10/18/15 1130  HGB 12.5*  HCT 39.6  PLT 311  LABPROT 18.0*  INR 1.48  CREATININE 1.03  TROPONINI 0.03    Estimated Creatinine Clearance: 58.8 mL/min (by C-G formula based on Cr of 1.03).   Medical History: Past Medical History  Diagnosis Date  . COPD (chronic obstructive pulmonary disease) (HCC)     Uses occasional nighttime O2  . Disseminated herpes zoster 2010  . GERD (gastroesophageal reflux disease)   . Asthma   . Pulmonary embolus ([place])     2003 and 2011  . Pulmonary nodules     Chest CT 09/11  . Mixed hyperlipidemia   . Chronic atrial fibrillation ([place])   . Lupus anticoagulant positive   . Essential hypertension, benign   . Cholelithiasis   . Rheumatoid arthritis(714.0)   . History of chicken pox 1941; 2011  . Anemia   . Chronic lower back pain   . Chronic diastolic heart failure ([place])   . Cirrhosis ([place])     Questionable, AFP normal Feb 01/2012, hx of ETOH use   . Headache(784.0)   . Cervical spondylosis without myelopathy   . Stage III chronic kidney disease   . Cellulitis of leg, left 01/03/2015  . Aortic stenosis, moderate 05/21/2015  . Dysrhythmia     chronic AFib  . 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 albuterol (PROAIR HFA) 108 (90 BASE) MCG/ACT inhaler Inhale 2 puffs into the lungs every 6 (six) hours as needed. Shortness of Breath 1 Inhaler 3 10/17/2015 at Unknown time  . allopurinol (ZYLOPRIM) 300 MG tablet Take 1 tablet (300 mg total) by mouth daily. 30 tablet 3 10/17/2015 at Unknown time  . cetirizine (ZYRTEC) 10 MG tablet Take 10 mg by mouth daily.     10/17/2015 at Unknown time  . DIGOX 125 MCG tablet TAKE ONE TABLET BY MOUTH DAILY 30 tablet 6 10/17/2015 at Unknown time  . diltiazem (CARDIZEM CD) 240 MG 24 hr capsule Take 1 capsule (240 mg total) by mouth daily. 30 capsule 1 10/17/2015 at Unknown time  . ferrous sulfate 325 (65 FE) MG tablet Take 325 mg by mouth daily.   10/17/2015 at Unknown time  . fluticasone (FLONASE) 50 MCG/ACT nasal spray Place 2 sprays into both nostrils daily.   0 10/17/2015 at Unknown time  . folic acid (FOLVITE) 1 MG tablet Take 1 mg by mouth daily.    10/17/2015 at Unknown time  . furosemide (LASIX) 40 MG tablet Take 1 tablet (40 mg total) by mouth daily. Take daily for prevention of fluid buildup. 30 tablet  unknown at Unknown time  . gabapentin (NEURONTIN) 300 MG capsule Take 2 capsules (600 mg total) by  mouth 2 (two) times daily. 30 capsule 0 10/17/2015 at Unknown time  . guaiFENesin (MUCINEX) 600 MG 12 hr tablet Take 1 tablet (600 mg total) by mouth 2 (two) times daily. 20 tablet 0 10/17/2015 at Unknown time  . ipratropium-albuterol (DUONEB) 0.5-2.5 (3) MG/3ML SOLN Take 3 mLs by nebulization every 6 (six) hours as needed. (Patient taking differently: Take 3 mLs by nebulization every 6 (six) hours as needed (shortness of breath). ) 360 mL 1 10/17/2015 at 2100  . ketoconazole (NIZORAL) 2 % shampoo Apply 1 application topically 3 (three) times a week.    10/17/2015 at Unknown time  . lidocaine-prilocaine (EMLA) cream Apply a quarter size amount to port site 1 hour prior to chemo. Do not rub in. Cover with plastic wrap. 30 g 3 Past  Month at Unknown time  . Omega-3 Fatty Acids (FISH OIL) 1000 MG CAPS Take 2 capsules by mouth 2 (two) times daily.    10/17/2015 at Unknown time  . omeprazole (PRILOSEC) 40 MG capsule Take 40 mg by mouth daily.   10/17/2015 at Unknown time  . ondansetron (ZOFRAN) 8 MG tablet Take 1 tablet every 8 hours as needed for nausea/vomiting. (Patient taking differently: Take 8 mg by mouth every 8 (eight) hours as needed for nausea or vomiting. ) 30 tablet 3 unknown  . Oxycodone HCl 10 MG TABS Take 1 tablet (10 mg total) by mouth every 6 (six) hours as needed. (Patient taking differently: Take 10 mg by mouth every 6 (six) hours as needed (pain). ) 100 tablet 0 10/17/2015 at Unknown time  . potassium chloride SA (K-DUR,KLOR-CON) 20 MEQ tablet Take 0.5 tablets (10 mEq total) by mouth daily. Starting 08/27/15.   10/17/2015 at Unknown time  . predniSONE (DELTASONE) 20 MG tablet On Days 1-5 of chemo take 4.5 tablets (90mg ) daily. 30 tablet 4 Past Month at Unknown time  . prochlorperazine (COMPAZINE) 10 MG tablet Take 1 tablet (10 mg total) by mouth every 6 (six) hours as needed (Nausea or vomiting). (Patient taking differently: Take 10 mg by mouth every 6 (six) hours as needed (Nausea or vomiting). nausea) 30 tablet 3 unknown  . Rivaroxaban (XARELTO STARTER PACK) 15 & 20 MG TBPK Take as directed on package: Start with one 15mg  tablet by mouth twice a day with food. On Day 22, switch to one 20mg  tablet once a day with food. 51 each 0 10/17/2015 at 0930  . Sennosides-Docusate Sodium (SENOKOT S PO) Take 1 tablet by mouth daily.    10/17/2015 at Unknown time  . tadalafil (CIALIS) 5 MG tablet Take 5 mg by mouth daily as needed for erectile dysfunction.    Past Month at Unknown time  . tamsulosin (FLOMAX) 0.4 MG CAPS capsule Take 1 capsule (0.4 mg total) by mouth at bedtime. For prostate treatment. 30 capsule 3 10/17/2015 at Unknown time  . Vitamin D, Ergocalciferol, (DRISDOL) 50000 UNITS CAPS capsule Take 1 capsule by mouth  once a week. friday  4 10/16/2015    Assessment: Continuation of Xarelto started 10/05/15 for PE by oncologist. Xarelto 15 mg po bid for 21 days, then Xarelto 20 mg daily Labs reviewed PTA medications reviewed Oncology notes reviewed  Goal of Therapy:  Treatment PE Monitor platelets by anticoagulation protocol: Yes   Plan:  Xarelto 15 mg po bid until 10/26/15, then start Xarelto 20 mg daily Monitor CBC Monitor for signs of bleeding  Abner Greenspan, Kaida Games Bennett 10/18/2015,7:24 PM

## 2015-10-18 NOTE — Progress Notes (Signed)
Patient is asleep and resting without problems , will start neb when he awakens.

## 2015-10-18 NOTE — H&P (Addendum)
[facility] History and Physical  [name] NAT:[id number] DOB: March 18, 1931 DOA: 10/18/2015  Referring physician: ED physician, [name] PCP: Tony Mustache, MD   Chief Complaint: Reported confusion at home.  HPI: [name] is a 81 y.o. male with a history of metastatic lymphoma undergoing chemotherapy, oxygen dependent COPD, pulmonary embolus on anticoagulation with the Xarelto, chronic atrial fibrillation, and chronic diastolic heart failure, who presents to the emergency department with reported confusion. The patient himself denies confusion. He does not believe he needs to be in the hospital. The history is being provided by both his daughters and the patient. Accordingly, the patient's daughter who lives with him found him early this morning sitting on the toilet with the lid down and with underwear soiled with urine. He was confused and disoriented per the family. His daughter helped him back to bed. She noted that he was jerking his hand or that his hands were more shaky than usual. He did not seem to recognize her for a couple of minutes. There was no obvious loss of consciousness. His daughter does not believe that he was sleepwalking. There has been no reported confusion with him taking his medications as his daughter administers his chronic medications. He has had no recent nausea, vomiting, abdominal pain, diarrhea, fever, chills, or pain with urination. The patient himself has no complaints with exception of some occasional side pain. He had not been wearing his oxygen this morning when he get up to used to bathroom.  In the ED, he was mildly tachycardic and with a relatively normal blood pressure. When he arrived to the floor, his blood pressure increased to over 427 systolically. He was initially oxygenating 88%, but it increased 100% with nasal cannula oxygen. His ABG on room air revealed a pH of 7.4, PCO2 of 48, and PO2 of 55. CT of his head revealed no acute  intracranial findings. His chest x-ray revealed increased right pleural effusion and bibasilar atelectasis. His other lab data are significant for a normal urinalysis, normal lactic acid level, and a WBC of 24.7. He is being admitted for further evaluation and management.     Review of Systems:   Review of systems is as above in history present illness. In addition, he has been intermittently confused, he has occasional pain in his back and his legs and his joints; occasional swelling in his ankles, and overall generalized weakness. Otherwise review of systems is negative.  Past Medical History  Diagnosis Date  . COPD (chronic obstructive pulmonary disease) (HCC)     Uses occasional nighttime O2  . Disseminated herpes zoster 2010  . GERD (gastroesophageal reflux disease)   . Asthma   . Pulmonary embolus ([place])     2003 and 2011  . Pulmonary nodules     Chest CT 09/11  . Mixed hyperlipidemia   . Chronic atrial fibrillation ([place])   . Lupus anticoagulant positive   . Essential hypertension, benign   . Cholelithiasis   . Rheumatoid arthritis(714.0)   . History of chicken pox 1941; 2011  . Anemia   . Chronic lower back pain   . Chronic diastolic heart failure ([place])   . Cirrhosis ([place])     Questionable, AFP normal Feb 01/2012, hx of ETOH use   . Headache(784.0)   . Cervical spondylosis without myelopathy   . Stage III chronic kidney disease   . Cellulitis of leg, left 01/03/2015  . Aortic stenosis, moderate 05/21/2015  . Dysrhythmia     chronic  AFib  . Diffuse large B cell lymphoma ([place]) 08/22/2015   Past Surgical History  Procedure Laterality Date  . Polypectomy  02/02/2012    RMR: Multiple colonic polyps removed, flat, tubular adenomas/ Left-sided diverticulosis  . Posterior lumbar vertebrae excision  2003; 2009; 2011  . Myringotomy      "3 times; both ears"  . Cataract extraction w/ intraocular lens  implant, bilateral    . Cholecystectomy  05/23/2012    Procedure: LAPAROSCOPIC  CHOLECYSTECTOMY WITH INTRAOPERATIVE CHOLANGIOGRAM;  Surgeon: Tony Klein, MD;  Location: [facility];  Service: General;  Laterality: N/A;  . Esophagogastroduodenoscopy  02/02/12    TJQ:[id number] size hiatal hernia; otherwise normal exam  . Colonoscopy  01/24/2013    QTM:AUQJFHL polyps/colonic diverticulosis  . Back surgery    . Portacath placement    . Lymph node biopsy    . Lymph node biopsy Right 09/11/2015    Procedure: RIGHT INGUINAL LYMPH NODE BIOPSY;  Surgeon: Tony Signs, MD;  Location: [facility];  Service: General;  Laterality: Right;  . Portacath placement Left 09/11/2015    Procedure: INSERTION PORT-A-CATH;  Surgeon: Tony Signs, MD;  Location: [facility];  Service: General;  Laterality: Left;   Social History: He is widowed. He is staying with his daughter [name] currently, although he had previously lived alone and independently. His other 2 daughters, [name] and [name] are here with him. He  reports that he quit smoking about 17 years ago. His smoking use included Cigarettes. He has a 57 pack-year smoking history. He has never used smokeless tobacco. He reports that he does not drink alcohol or use illicit drugs.  Allergies  Allergen Reactions  . Cymbalta [Duloxetine Hcl] Other (See Comments)    Confusion   . Procaine Hcl Hives and Other (See Comments)    NOVOCAINE: Sweating, Confusion, Not in right state of mind.  [name]    unknown    Family History  Problem Relation Age of Onset  . Colon cancer Neg Hx   . Liver disease Neg Hx   . Diabetes Mother     Prior to Admission medications   Medication Sig Start Date End Date Taking? Authorizing Provider  albuterol (PROAIR HFA) 108 (90 BASE) MCG/ACT inhaler Inhale 2 puffs into the lungs every 6 (six) hours as needed. Shortness of Breath 08/25/15  Yes Tony Gunning, NP  allopurinol (ZYLOPRIM) 300 MG tablet Take 1 tablet (300 mg total) by mouth daily. 09/18/15  Yes Tony Dike, MD  cetirizine (ZYRTEC)  10 MG tablet Take 10 mg by mouth daily.     Yes Historical Provider, MD  [place] 125 MCG tablet TAKE ONE TABLET BY MOUTH DAILY 09/28/15  Yes Tony Sark, MD  diltiazem (CARDIZEM CD) 240 MG 24 hr capsule Take 1 capsule (240 mg total) by mouth daily. 10/02/15  Yes Tony Dike, MD  ferrous sulfate 325 (65 FE) MG tablet Take 325 mg by mouth daily.   Yes Historical Provider, MD  fluticasone (FLONASE) 50 MCG/ACT nasal spray Place 2 sprays into both nostrils daily.  08/25/15  Yes Historical Provider, MD  folic acid (FOLVITE) 1 MG tablet Take 1 mg by mouth daily.    Yes Historical Provider, MD  furosemide (LASIX) 40 MG tablet Take 1 tablet (40 mg total) by mouth daily. Take daily for prevention of fluid buildup. 10/02/15  Yes Tony Dike, MD  gabapentin (NEURONTIN) 300 MG capsule Take 2 capsules (600 mg total) by mouth 2 (two) times daily.  08/25/15  Yes Lezlie Octave Black, NP  guaiFENesin (MUCINEX) 600 MG 12 hr tablet Take 1 tablet (600 mg total) by mouth 2 (two) times daily. 09/18/15  Yes Tony Dike, MD  ipratropium-albuterol (DUONEB) 0.5-2.5 (3) MG/3ML SOLN Take 3 mLs by nebulization every 6 (six) hours as needed. Patient taking differently: Take 3 mLs by nebulization every 6 (six) hours as needed (shortness of breath).  09/18/15  Yes Tony Dike, MD  ketoconazole (NIZORAL) 2 % shampoo Apply 1 application topically 3 (three) times a week.  05/21/13  Yes Historical Provider, MD  lidocaine-prilocaine (EMLA) cream Apply a quarter size amount to port site 1 hour prior to chemo. Do not rub in. Cover with plastic wrap. 09/17/15  Yes Patrici Ranks, MD  Omega-3 Fatty Acids (FISH OIL) 1000 MG CAPS Take 2 capsules by mouth 2 (two) times daily.    Yes Historical Provider, MD  omeprazole (PRILOSEC) 40 MG capsule Take 40 mg by mouth daily.   Yes Historical Provider, MD  ondansetron (ZOFRAN) 8 MG tablet Take 1 tablet every 8 hours as needed for nausea/vomiting. Patient taking differently: Take 8 mg by mouth every  8 (eight) hours as needed for nausea or vomiting.  09/16/15  Yes Baird Cancer, PA-C  Oxycodone HCl 10 MG TABS Take 1 tablet (10 mg total) by mouth every 6 (six) hours as needed. Patient taking differently: Take 10 mg by mouth every 6 (six) hours as needed (pain).  10/12/15  Yes Manon Hilding Kefalas, PA-C  potassium chloride SA (K-DUR,KLOR-CON) 20 MEQ tablet Take 0.5 tablets (10 mEq total) by mouth daily. Starting 08/27/15. 08/25/15  Yes Lezlie Octave Black, NP  predniSONE (DELTASONE) 20 MG tablet On Days 1-5 of chemo take 4.5 tablets (90mg ) daily. 10/05/15  Yes Baird Cancer, PA-C  prochlorperazine (COMPAZINE) 10 MG tablet Take 1 tablet (10 mg total) by mouth every 6 (six) hours as needed (Nausea or vomiting). Patient taking differently: Take 10 mg by mouth every 6 (six) hours as needed (Nausea or vomiting). nausea 09/16/15  Yes Baird Cancer, PA-C  Rivaroxaban (XARELTO STARTER PACK) 15 & 20 MG TBPK Take as directed on package: Start with one 15mg  tablet by mouth twice a day with food. On Day 22, switch to one 20mg  tablet once a day with food. 10/05/15  Yes Baird Cancer, PA-C  Sennosides-Docusate Sodium (SENOKOT S PO) Take 1 tablet by mouth daily.    Yes Historical Provider, MD  tadalafil (CIALIS) 5 MG tablet Take 5 mg by mouth daily as needed for erectile dysfunction.  05/18/15  Yes Historical Provider, MD  tamsulosin (FLOMAX) 0.4 MG CAPS capsule Take 1 capsule (0.4 mg total) by mouth at bedtime. For prostate treatment. 05/25/15  Yes Rexene Alberts, MD  Vitamin D, Ergocalciferol, (DRISDOL) 50000 UNITS CAPS capsule Take 1 capsule by mouth once a week. friday 04/27/15  Yes Historical Provider, MD   Physical Exam: Filed Vitals:   10/18/15 1450 10/18/15 1600 10/18/15 1604 10/18/15 1655  BP: 98/60 105/70  201/97  Pulse: 109     Temp: 98.2 F (36.8 C)  98.8 F (37.1 C) 98.7 F (37.1 C)  TempSrc: Oral  Oral Oral  Resp: 17 17  20   Height:    5\' 8"  (1.727 m)  Weight:    85.322 kg (188 lb 1.6 oz)  SpO2:  92%   100%    Wt Readings from Last 3 Encounters:  10/18/15 85.322 kg (188 lb 1.6 oz)  10/12/15 88.814 kg (195 lb  12.8 oz)  10/05/15 89.948 kg (198 lb 4.8 oz)    General:  Appears calm and comfortable; pleasant [name] sitting up in bed, in no acute distress. Eyes: PERRL, normal lids, irises & conjunctiva; conjunctivae are clear and sclerae are white. ENT: grossly normal hearing, lips & tongue; oropharynx reveals mildly dry mucous membranes; no posterior pharyngeal exudates or erythema. Neck: no LAD, masses or thyromegaly Cardiovascular: Irregular, irregular, with borderline tachycardia. No LE edema. Telemetry: Atrial fibrillation.  Respiratory: Occasional wheezes and fine crackles with decreased breath sounds on the right. Breathing nonlabored at rest. Abdomen: Positive bowel sounds, soft, minimal left lower quadrant tenderness; no rebound or distention or guarding. Skin: Right groin with induration and erythema; no P religion drainage; no significant warmth. Musculoskeletal: grossly normal tone BUE/BLE; arthritic hypertrophic changes seen in his joints without acute hot red joints. Psychiatric: grossly normal mood and affect, speech fluent and appropriate; pleasant affect. Neurologic: grossly non-focal; cranial nerves II through XII are grossly intact. He is oriented to himself, his daughters, place, month, and year.           Labs on Admission:  Basic Metabolic Panel:  Recent Labs Lab 10/12/15 1022 10/18/15 1130  NA 140 142  K 4.2 4.6  CL 101 104  CO2 32 32  GLUCOSE 119* 95  BUN 11 18  CREATININE 1.30* 1.03  CALCIUM 8.4* 8.8*   Liver Function Tests:  Recent Labs Lab 10/12/15 1022 10/18/15 1130  AST 31 40  ALT 17 35  ALKPHOS 56 75  BILITOT 1.5* 0.8  PROT 5.7* 6.0*  ALBUMIN 2.5* 2.9*   No results for input(s): LIPASE, AMYLASE in the last 168 hours.  Recent Labs Lab 10/18/15 1300  AMMONIA <9*   CBC:  Recent Labs Lab 10/12/15 1022  10/18/15 1130  WBC 15.3* 24.7*  NEUTROABS 13.9*  --   HGB 10.8* 12.5*  HCT 33.4* 39.6  MCV 101.8* 101.8*  PLT 225 311   Cardiac Enzymes:  Recent Labs Lab 10/18/15 1130  TROPONINI 0.03    BNP (last 3 results)  Recent Labs  08/22/15 1421 09/13/15 0650 09/29/15 1222  BNP 358.0* 595.0* 226.0*    ProBNP (last 3 results) No results for input(s): PROBNP in the last 8760 hours.  CBG: No results for input(s): GLUCAP in the last 168 hours.  Radiological Exams on Admission: Dg Chest 2 View  10/18/2015  CLINICAL DATA:  Disorientation for 2 weeks worse this morning, cursing at family members EXAM: CHEST  2 VIEW COMPARISON:  10/01/2015 FINDINGS: LEFT subclavian power port with tip projecting over SVC. Upper normal heart size. Atherosclerotic calcification aorta. Pulmonary vascular congestion. Increased RIGHT pleural effusion and basilar atelectasis. Decreased lung volumes versus previous exam. Prominent interstitial markings, potentially accentuated by hypoinflation. No definite acute infiltrate, pleural effusion or pneumothorax. Bones demineralized. IMPRESSION: Increased RIGHT pleural effusion and basilar atelectasis. Chronic interstitial prominence. Electronically Signed   By: Lavonia Dana M.D.   On: 10/18/2015 12:54   Ct Head Wo Contrast  10/18/2015  CLINICAL DATA:  Altered mental status for 2 weeks, headaches. Known history of lupus and large cell lymphoma EXAM: CT HEAD WITHOUT CONTRAST TECHNIQUE: Contiguous axial images were obtained from the base of the skull through the vertex without intravenous contrast. COMPARISON:  09/13/2013 FINDINGS: The bony calvarium is intact. Diffuse atrophic changes are identified and stable. No findings to suggest acute hemorrhage, acute infarction or space-occupying mass lesion are noted. Changes of prior chronic ischemia are noted. IMPRESSION: No acute abnormality noted.  Chronic atrophic and ischemic changes. Electronically Signed   By: Inez Catalina  M.D.   On: 10/18/2015 16:06    EKG: Independently reviewed. Atrial fibrillation, heart rate 120, left fascicular block.  Assessment/Plan Principal Problem:   Acute encephalopathy Active Problems:   Chronic atrial fibrillation (HCC)   Stage III chronic kidney disease   Chronic diastolic heart failure (HCC)   Diffuse large B cell lymphoma (HCC)   Recurrent right pleural effusion   Chronic respiratory failure with hypoxia (HCC)   Leukocytosis   Seroma   Cellulitis of groin   1. Altered mental status/acute encephalopathy. On exam, the patient does not appear to be encephalopathic. His daughters acknowledge that his mental status has improved. Etiology of his altered mental status this morning is unknown, but it could've been secondary to hypoxia. The patient has chronic hypoxic respiratory failure and apparently was not wearing his oxygen when he get up this morning. On room air in the ED, his PO2 was only 55. Another possibility is a  seizure, based on the shaking of his hands and transient amnesia. Will order an EEG for further evaluation. CT of patient's head was without acute changes. 2. Leukocytosis. With the brief review of the recent oncology notes, I do not see where he was given Neulasta. His white blood cell count was 15.3 on 10/17 and it is 24.7 today. He is afebrile. The elevated white blood cell count could be secondary to steroid therapy given with chemotherapy. It may also be associated with mild groin cellulitis. 3. Right groin cellulitis/seroma. Patient had a right groin lymph node biopsied recently by Dr. Arnoldo Morale per request by oncology. Over the past week or so, it has been draining a serous fluid per family. Patient was referred back to Dr. Arnoldo Morale if it caused him too much discomfort. However, the patient did not feel the need to go. On exam, there is an indurated area with some mild to moderate erythema, consistent with cellulitis. Vancomycin was given in the ED. This will be  continued for now. Will order one compresses as needed. 4. Recurrent right malignant pleural effusion. During the previous hospitalization to have aches ago, 1.5 L of fluid was drawn off via thoracentesis. His chest x-ray reveals recurrent right pleural effusion. The patient does not appear to be in extremis with regards to his pulmonary status, so we'll hold off on thoracentesis for now. 5. Chronic oxygen dependent COPD/chronic respiratory failure with hypoxia. As above in #1, his ABG on room air revealed a pH of 55. He is treated with oxygen 2.5 L/m continuously, but apparently the patient has not been compliant. Oxygen will be continued continuously at 2.5 L. 6. Chronic diastolic heart failure. Currently stable. Will continue Lasix. 7. Chronic atrial fibrillation with mild RVR. The patient did not take his diltiazem this morning. He will be given 10 mg IV of diltiazem and then restarted on his home dose. He is anticoagulated with Xarelto; starter pack recently started by oncology. We'll continue anticoagulation with dosing by pharmacy.  8.  Essential hypertension. The patient's blood pressure has increased after missing dose of his antihypertensive medication this morning. Will restart diltiazem. We'll add when necessary hydralazine. 9. Widespread metastatic lymphoma. The patient is followed by Dr. Whitney Muse. He is still receiving chemotherapy. DO NOT RESUSCITATE was recently established. We'll consult Dr. Whitney Muse as needed.   Code Status: full code  DVT Prophylaxis: Xarelto  Family Communication: discussed with daughters and patient  Disposition Plan: discharge to home in the  next 24-48 hours.   Time spent: One hour.  [facility] Pager [phone]

## 2015-10-18 NOTE — ED Provider Notes (Signed)
CSN: [id number]     Arrival date & time 10/18/15  1115 History   None    Chief Complaint  Patient presents with  . Altered Mental Status     (Consider location/radiation/quality/duration/timing/severity/associated sxs/prior Treatment) HPI   [name] is a 81 y.o. male presents with daughter 5:30a sitting on toilet, seemed disoriented. More disoriented than normal. Lid was closed, pt had his underwear on. Daughter helped pt to bed.  Seemed to not know where he was and was shaky.  Receiving chemo for lymphoma, non-hodgkins. Last treatment was 10/17.  Takes his own medicines, but daughter cups it out for him. No increased use of pain medication. Pt adds that he has had a tremor in his hands for the past two days. Daughter states pt did not receive Neulasta after this latest chemo treatment, but received it during his first one around the end of September.  No N/V/C/D, fever/chills, rashes, previous altered mental status, falls/injuries, or other complaints.   Past Medical History  Diagnosis Date  . COPD (chronic obstructive pulmonary disease) (HCC)     Uses occasional nighttime O2  . Disseminated herpes zoster 2010  . GERD (gastroesophageal reflux disease)   . Asthma   . Pulmonary embolus ([place])     2003 and 2011  . Pulmonary nodules     Chest CT 09/11  . Mixed hyperlipidemia   . Chronic atrial fibrillation ([place])   . Lupus anticoagulant positive   . Essential hypertension, benign   . Cholelithiasis   . Rheumatoid arthritis(714.0)   . History of chicken pox 1941; 2011  . Anemia   . Chronic lower back pain   . Chronic diastolic heart failure ([place])   . Cirrhosis ([place])     Questionable, AFP normal Feb 01/2012, hx of ETOH use   . Headache(784.0)   . Cervical spondylosis without myelopathy   . Stage III chronic kidney disease   . Cellulitis of leg, left 01/03/2015  . Aortic stenosis, moderate 05/21/2015  . Dysrhythmia     chronic AFib  . Diffuse large B cell lymphoma ([place])  08/22/2015   Past Surgical History  Procedure Laterality Date  . Polypectomy  02/02/2012    RMR: Multiple colonic polyps removed, flat, tubular adenomas/ Left-sided diverticulosis  . Posterior lumbar vertebrae excision  2003; 2009; 2011  . Myringotomy      "3 times; both ears"  . Cataract extraction w/ intraocular lens  implant, bilateral    . Cholecystectomy  05/23/2012    Procedure: LAPAROSCOPIC CHOLECYSTECTOMY WITH INTRAOPERATIVE CHOLANGIOGRAM;  Surgeon: Stark Klein, MD;  Location: [facility];  Service: General;  Laterality: N/A;  . Esophagogastroduodenoscopy  02/02/12    LSL:HTDSKAJG size hiatal hernia; otherwise normal exam  . Colonoscopy  01/24/2013    OTL:XBWIOMB polyps/colonic diverticulosis  . Back surgery    . Portacath placement    . Lymph node biopsy    . Lymph node biopsy Right 09/11/2015    Procedure: RIGHT INGUINAL LYMPH NODE BIOPSY;  Surgeon: Aviva Signs, MD;  Location: [facility];  Service: General;  Laterality: Right;  . Portacath placement Left 09/11/2015    Procedure: INSERTION PORT-A-CATH;  Surgeon: Aviva Signs, MD;  Location: [facility];  Service: General;  Laterality: Left;   Family History  Problem Relation Age of Onset  . Colon cancer Neg Hx   . Liver disease Neg Hx   . Diabetes Mother    Social History  Substance Use Topics  . Smoking status: Former Smoker -- 1.00 packs/day  for 57 years    Types: Cigarettes    Quit date: 01/27/1998  . Smokeless tobacco: Never Used  . Alcohol Use: No     Comment: "quit drinking 1976"    Review of Systems  Constitutional: Negative for fever, chills, diaphoresis and unexpected weight change.  Respiratory: Negative for cough, chest tightness and shortness of breath.   Cardiovascular: Positive for chest pain. Negative for palpitations and leg swelling.  Gastrointestinal: Negative for nausea, vomiting, abdominal pain, diarrhea and constipation.  Genitourinary: Negative for dysuria, flank pain, penile swelling and penile pain.   Musculoskeletal: Positive for back pain. Negative for myalgias, joint swelling, arthralgias and neck pain.       Pt has chronic back pain and states his back pain feels the same as his normal back pain.  Skin: Positive for wound. Negative for color change, pallor and rash.       Open surgical wound  Neurological: Positive for tremors. Negative for dizziness, seizures, syncope, facial asymmetry, speech difficulty, weakness, light-headedness, numbness and headaches.  Hematological: Negative for adenopathy.  All other systems reviewed and are negative.     Allergies  Cymbalta; Procaine hcl; and Bystolic  Home Medications   Prior to Admission medications   Medication Sig Start Date End Date Taking? Authorizing Provider  albuterol (PROAIR HFA) 108 (90 BASE) MCG/ACT inhaler Inhale 2 puffs into the lungs every 6 (six) hours as needed. Shortness of Breath 08/25/15  Yes Radene Gunning, NP  allopurinol (ZYLOPRIM) 300 MG tablet Take 1 tablet (300 mg total) by mouth daily. 09/18/15  Yes Kathie Dike, MD  cetirizine (ZYRTEC) 10 MG tablet Take 10 mg by mouth daily.     Yes Historical Provider, MD  [place] 125 MCG tablet TAKE ONE TABLET BY MOUTH DAILY 09/28/15  Yes Satira Sark, MD  diltiazem (CARDIZEM CD) 240 MG 24 hr capsule Take 1 capsule (240 mg total) by mouth daily. 10/02/15  Yes Kathie Dike, MD  ferrous sulfate 325 (65 FE) MG tablet Take 325 mg by mouth daily.   Yes Historical Provider, MD  fluticasone (FLONASE) 50 MCG/ACT nasal spray Place 2 sprays into both nostrils daily.  08/25/15  Yes Historical Provider, MD  folic acid (FOLVITE) 1 MG tablet Take 1 mg by mouth daily.    Yes Historical Provider, MD  furosemide (LASIX) 40 MG tablet Take 1 tablet (40 mg total) by mouth daily. Take daily for prevention of fluid buildup. 10/02/15  Yes Kathie Dike, MD  gabapentin (NEURONTIN) 300 MG capsule Take 2 capsules (600 mg total) by mouth 2 (two) times daily. 08/25/15  Yes Lezlie Octave Black, NP   guaiFENesin (MUCINEX) 600 MG 12 hr tablet Take 1 tablet (600 mg total) by mouth 2 (two) times daily. 09/18/15  Yes Kathie Dike, MD  ipratropium-albuterol (DUONEB) 0.5-2.5 (3) MG/3ML SOLN Take 3 mLs by nebulization every 6 (six) hours as needed. Patient taking differently: Take 3 mLs by nebulization every 6 (six) hours as needed (shortness of breath).  09/18/15  Yes Kathie Dike, MD  ketoconazole (NIZORAL) 2 % shampoo Apply 1 application topically 3 (three) times a week.  05/21/13  Yes Historical Provider, MD  lidocaine-prilocaine (EMLA) cream Apply a quarter size amount to port site 1 hour prior to chemo. Do not rub in. Cover with plastic wrap. 09/17/15  Yes Patrici Ranks, MD  Omega-3 Fatty Acids (FISH OIL) 1000 MG CAPS Take 2 capsules by mouth 2 (two) times daily.    Yes Historical Provider, MD  omeprazole ([place])  40 MG capsule Take 40 mg by mouth daily.   Yes Historical Provider, MD  ondansetron (ZOFRAN) 8 MG tablet Take 1 tablet every 8 hours as needed for nausea/vomiting. Patient taking differently: Take 8 mg by mouth every 8 (eight) hours as needed for nausea or vomiting.  09/16/15  Yes Baird Cancer, PA-C  Oxycodone HCl 10 MG TABS Take 1 tablet (10 mg total) by mouth every 6 (six) hours as needed. Patient taking differently: Take 10 mg by mouth every 6 (six) hours as needed (pain).  10/12/15  Yes Manon Hilding Kefalas, PA-C  potassium chloride SA (K-DUR,KLOR-CON) 20 MEQ tablet Take 0.5 tablets (10 mEq total) by mouth daily. Starting 08/27/15. 08/25/15  Yes Lezlie Octave Black, NP  predniSONE (DELTASONE) 20 MG tablet On Days 1-5 of chemo take 4.5 tablets (90mg ) daily. 10/05/15  Yes Baird Cancer, PA-C  prochlorperazine (COMPAZINE) 10 MG tablet Take 1 tablet (10 mg total) by mouth every 6 (six) hours as needed (Nausea or vomiting). Patient taking differently: Take 10 mg by mouth every 6 (six) hours as needed (Nausea or vomiting). nausea 09/16/15  Yes Baird Cancer, PA-C  Rivaroxaban (XARELTO  STARTER PACK) 15 & 20 MG TBPK Take as directed on package: Start with one 15mg  tablet by mouth twice a day with food. On Day 22, switch to one 20mg  tablet once a day with food. 10/05/15  Yes Baird Cancer, PA-C  Sennosides-Docusate Sodium (SENOKOT S PO) Take 1 tablet by mouth daily.    Yes Historical Provider, MD  tadalafil (CIALIS) 5 MG tablet Take 5 mg by mouth daily as needed for erectile dysfunction.  05/18/15  Yes Historical Provider, MD  tamsulosin (FLOMAX) 0.4 MG CAPS capsule Take 1 capsule (0.4 mg total) by mouth at bedtime. For prostate treatment. 05/25/15  Yes Rexene Alberts, MD  Vitamin D, Ergocalciferol, (DRISDOL) 50000 UNITS CAPS capsule Take 1 capsule by mouth once a week. friday 04/27/15  Yes Historical Provider, MD   BP 105/70 mmHg  Pulse 109  Temp(Src) 98.8 F (37.1 C) (Oral)  Resp 17  Ht 5\' 8"  (1.727 m)  Wt 195 lb (88.451 kg)  BMI 29.66 kg/m2  SpO2 92% Physical Exam  Constitutional: He appears well-developed and well-nourished. No distress.  HENT:  Head: Normocephalic and atraumatic.  Eyes: Conjunctivae are normal. Pupils are equal, round, and reactive to light.  Cardiovascular: Normal rate, regular rhythm and normal heart sounds.   Pulmonary/Chest: Effort normal and breath sounds normal. No respiratory distress.  Abdominal: Soft. Bowel sounds are normal.  Genitourinary: Penis normal. No penile tenderness.  Musculoskeletal: He exhibits no edema or tenderness.  Neurological: He is alert.  Pt oriented to self, year, and location, but could not answer about the date/day of the week or why he is here in the hospital or why people say he is here. Strength 5/5 in all extremities. Cranial nerves II-XII grossly intact. Occasional tremor noted to upper extremities.  Skin: Skin is warm and dry. He is not diaphoretic.  Open biopsy site with serosanguinous drainage right inguinal. Erythema and swelling extending from site. No other fluid able to be expressed. Tender in surrounding  tissues.   Nursing note and vitals reviewed.   ED Course  Procedures (including critical care time) Labs Review Labs Reviewed  COMPREHENSIVE METABOLIC PANEL - Abnormal; Notable for the following:    Calcium 8.8 (*)    Total Protein 6.0 (*)    Albumin 2.9 (*)    All other components within normal  limits  CBC - Abnormal; Notable for the following:    WBC 24.7 (*)    RBC 3.89 (*)    Hemoglobin 12.5 (*)    MCV 101.8 (*)    RDW 17.7 (*)    All other components within normal limits  PROTIME-INR - Abnormal; Notable for the following:    Prothrombin Time 18.0 (*)    All other components within normal limits  BLOOD GAS, ARTERIAL - Abnormal; Notable for the following:    pH, Arterial 7.455 (*)    pCO2 arterial 48.0 (*)    pO2, Arterial 54.7 (*)    Bicarbonate 32.1 (*)    Acid-Base Excess 9.0 (*)    All other components within normal limits  AMMONIA - Abnormal; Notable for the following:    Ammonia <9 (*)    All other components within normal limits  CULTURE, BLOOD (ROUTINE X 2)  CULTURE, BLOOD (ROUTINE X 2)  WOUND CULTURE  URINALYSIS, ROUTINE W REFLEX MICROSCOPIC (NOT AT [facility])  TROPONIN I  I-STAT CG4 LACTIC ACID, ED  I-STAT CG4 LACTIC ACID, ED  I-STAT CG4 LACTIC ACID, ED    Imaging Review Dg Chest 2 View  10/18/2015  CLINICAL DATA:  Disorientation for 2 weeks worse this morning, cursing at family members EXAM: CHEST  2 VIEW COMPARISON:  10/01/2015 FINDINGS: LEFT subclavian power port with tip projecting over SVC. Upper normal heart size. Atherosclerotic calcification aorta. Pulmonary vascular congestion. Increased RIGHT pleural effusion and basilar atelectasis. Decreased lung volumes versus previous exam. Prominent interstitial markings, potentially accentuated by hypoinflation. No definite acute infiltrate, pleural effusion or pneumothorax. Bones demineralized. IMPRESSION: Increased RIGHT pleural effusion and basilar atelectasis. Chronic interstitial prominence. Electronically  Signed   By: Lavonia Dana M.D.   On: 10/18/2015 12:54   Ct Head Wo Contrast  10/18/2015  CLINICAL DATA:  Altered mental status for 2 weeks, headaches. Known history of lupus and large cell lymphoma EXAM: CT HEAD WITHOUT CONTRAST TECHNIQUE: Contiguous axial images were obtained from the base of the skull through the vertex without intravenous contrast. COMPARISON:  09/13/2013 FINDINGS: The bony calvarium is intact. Diffuse atrophic changes are identified and stable. No findings to suggest acute hemorrhage, acute infarction or space-occupying mass lesion are noted. Changes of prior chronic ischemia are noted. IMPRESSION: No acute abnormality noted.  Chronic atrophic and ischemic changes. Electronically Signed   By: Inez Catalina M.D.   On: 10/18/2015 16:06   I have personally reviewed and evaluated these images and lab results as part of my medical decision-making.   EKG Interpretation   Date/Time:  Sunday October 18 2015 11:32:24 EDT Ventricular Rate:  120 PR Interval:    QRS Duration: 99 QT Interval:  341 QTC Calculation: 482 R Axis:   -104 Text Interpretation:  Atrial fibrillation Left anterior fascicular block  Borderline low voltage, extremity leads Right ventricular hypertrophy  since last tracing no significant change Confirmed by MILLER  MD, BRIAN  ([id number]) on 10/18/2015 11:46:27 AM      MDM   Final diagnoses:  Altered mental status, unspecified altered mental status type  Pleural effusion    TAM DELISLE presents with altered mental status since this morning.   Findings and plan of care discussed with Noemi Chapel, MD.  Will need to order home meds, pt has not taken any today.  CBC shows an increased WBC count of 24.7, which means he has a natural leukocytosis because he has not taken Neulasta for at least a month. CBC shows a  Hgb of 12.5, up from 10 a week ago, which indicates likely dehydration. EKG shows Afib with RVR at a rate of 120. CXR shows Right pleural effusion,  which pt has had before and subsequently had drained. Pt daughter states this draining was performed here at [facility] around 10/6, but so far unable to find record of procedure or lab results. Pt unable to urinate spontaneously, which he states is normal for him due to his enlarged prostate, in and out cath ordered. IV Vancomycin possibly indicated for wound infection. UA shows no UTI. Lactic acid normal. ABG shows a slight alkalosis of 7.455, CO2 level of 48, and PO2 level of 54.7.  2:17 PM Pt has indications for admission with altered mental status, leukocytosis, and tachycardia. Hospitalist consult request placed.  2:28 PM Spoke with hospitalist, Rexene Alberts. Noemi Chapel, MD also spoke with Dr. Caryn Section. Asked for pt to receive a non-contrast head CT, and admit to telemetry under observation. 4:10 PM CT shows no acute abnormalities. Pt reassessed with no significant changes found.  Echo in May 2016 revealed: Mild LVH with LVEF 65-70%, indeterminate diastolic function. Moderate left atrial enlargement. Moderate to severe calcific aortic stenosis as outlined above - there has been progression compared to the prior study. Trivial tricuspid regurgitation, unable to assess PASP.  Lorayne Bender, PA-C 10/18/15 [place], MD 10/20/15 [phone]

## 2015-10-19 ENCOUNTER — Inpatient Hospital Stay (HOSPITAL_COMMUNITY)
Admit: 2015-10-19 | Discharge: 2015-10-19 | Disposition: A | Discharge status: Home / Self Care | Payer: Medicare Other | Attending: Internal Medicine | Admitting: Internal Medicine

## 2015-10-19 DIAGNOSIS — D72829 Elevated white blood cell count, unspecified: Secondary | ICD-10-CM

## 2015-10-19 DIAGNOSIS — N183 Chronic kidney disease, stage 3 (moderate): Secondary | ICD-10-CM

## 2015-10-19 LAB — BASIC METABOLIC PANEL
ANION GAP: 4 — AB (ref 5–15)
BUN: 14 mg/dL (ref 6–20)
CHLORIDE: 99 mmol/L — AB (ref 101–111)
CO2: 36 mmol/L — AB (ref 22–32)
Calcium: 8.3 mg/dL — ABNORMAL LOW (ref 8.9–10.3)
Creatinine, Ser: 1.08 mg/dL (ref 0.61–1.24)
GFR calc non Af Amer: >60 mL/min (ref >60)
Glucose, Bld: 92 mg/dL (ref 65–99)
Potassium: 3.9 mmol/L (ref 3.5–5.1)
Sodium: 139 mmol/L (ref 135–145)

## 2015-10-19 LAB — CBC
HEMATOCRIT: 36.2 % — AB (ref 39.0–52.0)
HEMOGLOBIN: 11.2 g/dL — AB (ref 13.0–17.0)
MCH: 31.5 pg (ref 26.0–34.0)
MCHC: 30.9 g/dL (ref 30.0–36.0)
MCV: 102 fL — AB (ref 78.0–100.0)
Platelets: 218 10*3/uL (ref 150–400)
RBC: 3.55 MIL/uL — AB (ref 4.22–5.81)
RDW: 17.6 % — ABNORMAL HIGH (ref 11.5–15.5)
WBC: 15.7 10*3/uL — ABNORMAL HIGH (ref 4.0–10.5)

## 2015-10-19 LAB — MRSA PCR SCREENING: MRSA BY PCR: POSITIVE — AB

## 2015-10-19 NOTE — Progress Notes (Signed)
EEG Completed; Results Pending  

## 2015-10-19 NOTE — Care Management Note (Signed)
Case Management Note  Patient Details  Name: TRUSTEN HUME MRN: [id number] Date of Birth: 06/12/1931  Subjective/Objective:                  Pt admitted from home with encephalopathy. Pt lives with his daughter and will return home at discharge. Pt is fairly independent with ADL's. Pt has a cane, neb machine, and home O2 in place.  Action/Plan: Will follow for discharge planning needs.  Expected Discharge Date:  10/21/15               Expected Discharge Plan:  Home/Self Care  In-House Referral:  NA  Discharge planning Services  CM Consult  Post Acute Care Choice:  NA Choice offered to:  NA  DME Arranged:    DME Agency:     HH Arranged:    HH Agency:     Status of Service:  Completed, signed off  Medicare Important Message Given:    Date Medicare IM Given:    Medicare IM give by:    Date Additional Medicare IM Given:    Additional Medicare Important Message give by:     If discussed at [place] of Stay Meetings, dates discussed:    Additional Comments:  Joylene Draft, RN 10/19/2015, 3:50 PM

## 2015-10-19 NOTE — Progress Notes (Signed)
[facility] PROGRESS NOTE  [name] WCB:[id number] DOB: 28-Jun-1931 DOA: 10/18/2015 PCP: [name], MD  Assessment/Plan: 1. Acute encephalopathy secondary to hypoxia/underlinging cellulitis/chronic pain medications. Reported to be back at baseline. He has chronic hypoxic respiratory failure and apparently was not wearing his oxygen. On room air in the ED, his PO2 was only 55. EEG ordered for further evaluation of possible seizure, based on the shaking of his hands and transient amnesia noted. CT head was without acute changes. Blood cultures are pending.  2. Leukocytosis, improving. Possibly secondary to steroid therapy given with chemotherapy. It may also be associated with mild groin cellulitis. 3. Right groin cellulitis/seroma. Patient had a right groin lymph node biopsied recently by Dr. Arnoldo Morale per request by oncology. Over the past week or so, it has been draining a serous fluid per family. Started on Vancomycin in  the ED. 4. Recurrent right malignant pleural effusion. During the previous hospitalization 1.5 L of fluid was drawn off via thoracentesis. His chest x-ray reveals recurrent right pleural effusion. The patient does not appear to be in extremis with regards to his pulmonary status, so will hold off on thoracentesis for now. 5. Chronic oxygen dependent COPD/chronic respiratory failure with hypoxia.He is treated with oxygen 2.5 L/m continuously,whcih we will continue. 6. Chronic diastolic heart failure. Currently stable. Will continue Lasix. 7. Chronic atrial fibrillation with mild RVR. Was given 10 mg IV of diltiazem upon admisiion and then restarted on his home dose. He is anticoagulated with Xarelto; starter pack recently started by oncology. Will continue anticoagulation with dosing by pharmacy.  8. Essential hypertension.Blood pressure increased after missing dose of his antihypertensive medication. Restarted diltiazem. WIll add hydralazine PRN. 9. Widespread  metastatic lymphoma. The patient is followed by Dr. Whitney Muse. He is still receiving chemotherapy. DO NOT RESUSCITATE was recently established. Will consult Dr. Whitney Muse as needed. 10. Right knee pain. This is a new complaint. Denies any recent falls. Will order XR.   Code Status: DNR DVT prophylaxis: Xarelto  Family Communication: Family bedside. Discussed with patient who understands and has no concerns at this time. Disposition Plan: Anticipate discharge within 1-2 days   Consultants:    Procedures:   Antibiotics:  Vancomycin 10/24>>  HPI/Subjective: Family member bedside. Reports that his confusion has improved since admission. He does wear his oxygen at home but she left it in the car when she brought him to the ED. She denies any new pain medications. The knee pain is new and they deny any recent falls.   He reports feeling better and denies SOB. He does complain of knee pain   Objective: Filed Vitals:   10/19/15 0731  BP:   Pulse: 97  Temp:   Resp: 16    Intake/Output Summary (Last 24 hours) at 10/19/15 1154 Last data filed at 10/19/15 0900  Gross per 24 hour  Intake 986.67 ml  Output    300 ml  Net 686.67 ml   Filed Weights   10/18/15 1125 10/18/15 1655 10/19/15 0601  Weight: 88.451 kg (195 lb) 85.322 kg (188 lb 1.6 oz) 83.6 kg (184 lb 4.9 oz)    Exam:  General: NAD. Sitting in room chair and  looks comfortable Cardiovascular: Irregular rhythm and regular rate RRR, S1, S2  Respiratory: Diminished breath sounds at right base. No wheezing, rales or rhonchi Abdomen: soft, non tender, no distention , bowel sounds normal Skin: Incision in right groin with mild surrounding erythema and serous color drainage noted from wound.  Musculoskeletal: right knee  is painful with ROM, no TTP, no erythema or warmth. No obvious joint effusion. 1+ pedal edema b/l  Data Reviewed: Basic Metabolic Panel:  Recent Labs Lab 10/18/15 1130 10/19/15 0704  NA 142 139  K 4.6 3.9   CL 104 99*  CO2 32 36*  GLUCOSE 95 92  BUN 18 14  CREATININE 1.03 1.08  CALCIUM 8.8* 8.3*   Liver Function Tests:  Recent Labs Lab 10/18/15 1130  AST 40  ALT 35  ALKPHOS 75  BILITOT 0.8  PROT 6.0*  ALBUMIN 2.9*    Recent Labs Lab 10/18/15 1300  AMMONIA <9*   CBC:  Recent Labs Lab 10/18/15 1130 10/19/15 0704  WBC 24.7* 15.7*  HGB 12.5* 11.2*  HCT 39.6 36.2*  MCV 101.8* 102.0*  PLT 311 218   Cardiac Enzymes:  Recent Labs Lab 10/18/15 1130  TROPONINI 0.03   BNP (last 3 results)  Recent Labs  08/22/15 1421 09/13/15 0650 09/29/15 1222  BNP 358.0* 595.0* 226.0*     Recent Results (from the past 240 hour(s))  Wound culture     Status: None (Preliminary result)   Collection Time: 10/18/15 12:29 PM  Result Value Ref Range Status   Specimen Description BUTTOCKS  Final   Special Requests Immunocompromised  Final   Gram Stain PENDING  Incomplete   Culture   Final    Culture reincubated for better growth Performed at [facility]    Report Status PENDING  Incomplete  Blood culture (routine x 2)     Status: None (Preliminary result)   Collection Time: 10/18/15  1:00 PM  Result Value Ref Range Status   Specimen Description LEFT ANTECUBITAL  Final   Special Requests BOTTLES DRAWN AEROBIC AND ANAEROBIC 6CC  Final   Culture NO GROWTH < 24 HOURS  Final   Report Status PENDING  Incomplete  Blood culture (routine x 2)     Status: None (Preliminary result)   Collection Time: 10/18/15  1:05 PM  Result Value Ref Range Status   Specimen Description BLOOD LEFT FOREARM  Final   Special Requests BOTTLES DRAWN AEROBIC AND ANAEROBIC 6CC   Final   Culture NO GROWTH < 24 HOURS  Final   Report Status PENDING  Incomplete     Studies: Dg Chest 2 View  10/18/2015  CLINICAL DATA:  Disorientation for 2 weeks worse this morning, cursing at family members EXAM: CHEST  2 VIEW COMPARISON:  10/01/2015 FINDINGS: LEFT subclavian power port with tip projecting over  SVC. Upper normal heart size. Atherosclerotic calcification aorta. Pulmonary vascular congestion. Increased RIGHT pleural effusion and basilar atelectasis. Decreased lung volumes versus previous exam. Prominent interstitial markings, potentially accentuated by hypoinflation. No definite acute infiltrate, pleural effusion or pneumothorax. Bones demineralized. IMPRESSION: Increased RIGHT pleural effusion and basilar atelectasis. Chronic interstitial prominence. Electronically Signed   By: Lavonia Dana M.D.   On: 10/18/2015 12:54   Ct Head Wo Contrast  10/18/2015  CLINICAL DATA:  Altered mental status for 2 weeks, headaches. Known history of lupus and large cell lymphoma EXAM: CT HEAD WITHOUT CONTRAST TECHNIQUE: Contiguous axial images were obtained from the base of the skull through the vertex without intravenous contrast. COMPARISON:  09/13/2013 FINDINGS: The bony calvarium is intact. Diffuse atrophic changes are identified and stable. No findings to suggest acute hemorrhage, acute infarction or space-occupying mass lesion are noted. Changes of prior chronic ischemia are noted. IMPRESSION: No acute abnormality noted.  Chronic atrophic and ischemic changes. Electronically Signed   By: Elta Guadeloupe  Lukens M.D.   On: 10/18/2015 16:06    Scheduled Meds: . allopurinol  300 mg Oral Daily  . antiseptic oral rinse  7 mL Mouth Rinse BID  . digoxin  0.125 mg Oral Daily  . diltiazem  240 mg Oral Daily  . ferrous sulfate  325 mg Oral Daily  . folic acid  1 mg Oral Daily  . furosemide  40 mg Oral Daily  . gabapentin  600 mg Oral BID  . levalbuterol  0.63 mg Nebulization Q8H  . loratadine  10 mg Oral Daily  . pantoprazole  40 mg Oral Daily  . potassium chloride SA  10 mEq Oral Daily  . rivaroxaban  15 mg Oral BID WC  . [START ON 10/26/2015] rivaroxaban  20 mg Oral Q supper  . senna  1 tablet Oral QHS  . tamsulosin  0.4 mg Oral QHS  . vancomycin  1,500 mg Intravenous Q24H   Continuous Infusions: . sodium chloride  50 mL/hr at 10/18/15 1800    Principal Problem:   Acute encephalopathy Active Problems:   Chronic atrial fibrillation (HCC)   Chronic diastolic heart failure (HCC)   Stage III chronic kidney disease   Diffuse large B cell lymphoma (HCC)   Recurrent right pleural effusion   Chronic respiratory failure with hypoxia (HCC)   Leukocytosis   Seroma   Cellulitis of groin    Time spent: 25 minutes   Kathie Dike, MD  [facility] Pager [phone]. If 7PM-7AM, please contact night-coverage at [email or web address], password [facility] 10/19/2015, 11:54 AM  LOS: 1 day     By signing my name below, I, Rennis Harding, attest that this documentation has bee 11:35n prepared under the direction and in the presence of Kathie Dike, MD. Electronically signed: Rennis Harding, Scribe. 10/19/2015 11:38AM  I, Dr. Kathie Dike, personally performed the services described in this documentaiton. All medical record entries made by the scribe were at my direction and in my presence. I have reviewed the chart and agree that the record reflects my personal performance and is accurate and complete  Kathie Dike, MD, 10/19/2015 11:59 AM

## 2015-10-19 NOTE — Evaluation (Signed)
Physical Therapy Evaluation Patient Details Name: [name] MRN: [id number] DOB: 1931-06-02 Today's Date: 10/19/2015   History of Present Illness  [name] is a 81 y.o. male with a history of metastatic lymphoma undergoing chemotherapy, oxygen dependent COPD, pulmonary embolus on anticoagulation with the Xarelto, chronic atrial fibrillation, and chronic diastolic heart failure, who presents to the emergency department with reported confusion. The patient himself denies confusion. He does not believe he needs to be in the hospital. The history is being provided by both his daughters and the patient. Accordingly, the patient's daughter who lives with him found him early this morning sitting on the toilet with the lid down and with underwear soiled with urine. He was confused and disoriented per the family. His daughter helped him back to bed. She noted that he was jerking his hand or that his hands were more shaky than usual. He did not seem to recognize her for a couple of minutes. There was no obvious loss of consciousness. His daughter does not believe that he was sleepwalking. There has been no reported confusion with him taking his medications as his daughter administers his chronic medications. He has had no recent nausea, vomiting, abdominal pain, diarrhea, fever, chills, or pain with urination. The patient himself has no complaints with exception of some occasional side pain. He had not been wearing his oxygen this morning when he get up to used to bathroom.  Clinical Impression   Pt was seen for evaluation, daughter was present.  He appears to be at cognitive baseline.  Pt was alert and cooperative, strength found to be WNL.  He describes having pain in the right knee and ankle with weight bearing although he is able to ambulate short distances with a walker and good stability.  His daughter states that he did fall a day or two ago and fell on his right knee.  There is no bruising or  edema, full ROM.  He may have had a torsion type injury to the knee.  I have advised him to see his MD if this pain persists.  Pt is very short tempered and is not interested in having any HHPT, DME is adequate.  He has excellent support at home and should be able to transition to home at d/c.    Follow Up Recommendations No PT follow up    Equipment Recommendations  None recommended by PT    Recommendations for Other Services   none    Precautions / Restrictions Precautions Precautions: Fall Restrictions Weight Bearing Restrictions: No      Mobility  Bed Mobility Overal bed mobility: Modified Independent             General bed mobility comments: sleeps in a hospital bed  Transfers Overall transfer level: Needs assistance Equipment used: Rolling walker (2 wheeled) Transfers: Sit to/from Stand Sit to Stand: Modified independent (Device/Increase time)            Ambulation/Gait Ambulation/Gait assistance: Modified independent (Device/Increase time) Ambulation Distance (Feet): 12 Feet (x 2 laps...gait distance limited by pain in right knee and ankle) Assistive device: Rolling walker (2 wheeled) Gait Pattern/deviations: Antalgic;Decreased stance time - right   Gait velocity interpretation: >2.62 ft/sec, indicative of independent [employer]ist information [employer] manager [place] (Stroke Patients Only)       Balance Overall balance assessment: Modified Independent  Pertinent Vitals/Pain      Home Living Family/patient expects to be discharged to:: Private residence Living Arrangements: Children Available Help at Discharge: Family;Available 24 hours/day Type of Home: House Home Access: Stairs to enter Entrance Stairs-Rails: Can reach both;Right;Left Entrance Stairs-Number of Steps: 3 Home Layout: One level Home Equipment: Bedside commode;Walker - 2  wheels;Cane - single point;Shower seat;Grab bars - tub/shower;Wheelchair - manual;Hospital bed      Prior Function Level of Independence: Independent with assistive device(s)         Comments: used a cane      Hand Dominance   Dominant Hand: Right    Extremity/Trunk Assessment               Lower Extremity Assessment: Overall WFL for tasks assessed (pt has pain in the right ankle and knee with weight bearing which limites gait but strength is WNL)      Cervical / Trunk Assessment: Kyphotic  Communication   Communication: No difficulties  Cognition Arousal/Alertness: Awake/alert Behavior During Therapy: WFL for tasks assessed/performed Overall Cognitive Status: Within Functional Limits for tasks assessed                      General Comments      Exercises        Assessment/Plan    PT Assessment Patent does not need any further PT services  PT Diagnosis     PT Problem List    PT Treatment Interventions     PT Goals (Current goals can be found in the Care Plan section) Acute Rehab PT Goals PT Goal Formulation: All assessment and education complete, DC therapy    Frequency     Barriers to discharge  none      Co-evaluation               End of Session Equipment Utilized During Treatment: Gait belt;Oxygen Activity Tolerance: Patient tolerated treatment well;No increased pain Patient left: in chair;with call bell/phone within reach;with nursing/sitter in room;with family/visitor present Nurse Communication: Mobility status         Time: 6333-5456 PT Time Calculation (min) (ACUTE ONLY): 46 min   Charges:   PT Evaluation $Initial PT Evaluation Tier I: 1 Procedure     PT G CodesSable Feil  PT 10/19/2015, 9:47 AM [phone]

## 2015-10-20 ENCOUNTER — Inpatient Hospital Stay (HOSPITAL_COMMUNITY): Payer: Medicare Other

## 2015-10-20 ENCOUNTER — Other Ambulatory Visit (HOSPITAL_COMMUNITY): Payer: Self-pay | Admitting: Oncology

## 2015-10-20 LAB — BASIC METABOLIC PANEL
ANION GAP: 6 (ref 5–15)
BUN: 13 mg/dL (ref 6–20)
CALCIUM: 8.4 mg/dL — AB (ref 8.9–10.3)
CHLORIDE: 99 mmol/L — AB (ref 101–111)
CO2: 33 mmol/L — AB (ref 22–32)
Creatinine, Ser: 1 mg/dL (ref 0.61–1.24)
Glucose, Bld: 125 mg/dL — ABNORMAL HIGH (ref 65–99)
POTASSIUM: 3.8 mmol/L (ref 3.5–5.1)
SODIUM: 138 mmol/L (ref 135–145)

## 2015-10-20 LAB — CBC
HEMATOCRIT: 34.7 % — AB (ref 39.0–52.0)
HEMOGLOBIN: 11 g/dL — AB (ref 13.0–17.0)
MCH: 31.8 pg (ref 26.0–34.0)
MCHC: 31.7 g/dL (ref 30.0–36.0)
MCV: 100.3 fL — ABNORMAL HIGH (ref 78.0–100.0)
Platelets: 207 10*3/uL (ref 150–400)
RBC: 3.46 MIL/uL — AB (ref 4.22–5.81)
RDW: 17.3 % — ABNORMAL HIGH (ref 11.5–15.5)
WBC: 16.9 10*3/uL — AB (ref 4.0–10.5)

## 2015-10-20 MED ORDER — MUPIROCIN 2 % EX OINT
1.0000 "application " | TOPICAL_OINTMENT | Freq: Two times a day (BID) | CUTANEOUS | Status: DC
  Administered 2015-10-20 – 2015-10-21 (×3): 1 via NASAL
  Filled 2015-10-20: qty 22

## 2015-10-20 MED ORDER — CHLORHEXIDINE GLUCONATE CLOTH 2 % EX PADS
6.0000 | MEDICATED_PAD | Freq: Every day | CUTANEOUS | Status: DC
  Administered 2015-10-20 – 2015-10-21 (×2): 6 via TOPICAL

## 2015-10-20 NOTE — Consult Note (Signed)
Right groin wound with superficial noninfected wound separation.  No need for antibiotics.  Discussed with wife.  Keep wound clean and apply neosporin ointment daily as needed.  She understands and agrees.  Follow up in my office prn.

## 2015-10-20 NOTE — Progress Notes (Signed)
PT Cancellation Note  Patient Details Name: [name] MRN: [id number] DOB: December 21, 1931   Cancelled Treatment:    Reason Eval/Treat Not Completed: Other (comment).  Pt was seen at bedside.  He had just gotten back to the bed and was tired.  PT saw him yesterday and he was c/o right knee pain at that time although he had no edema or bruising noted and he had full active ROM and strength.  I inspected his knee today and he has a significant bulge at the lateral right patella.  ROM is slow and uncomfortable.  He is due to have Xrays today of the knee.  We will defer a reassessment until tomorrow when we will have more information as to the status of his knee.   Demetrios Isaacs L  PT 10/20/2015, 1:52 PM [phone]

## 2015-10-20 NOTE — Progress Notes (Signed)
[facility] PROGRESS NOTE  [name] LOV:[id number] DOB: 1931/09/13 DOA: 10/18/2015 PCP: [name], MD   Summary  64 yom PMH  with widespread metastatic lymphoma, right malignant pleural effusion and chronic oxygen dependence presented with acute encephalopathy with unclear origin. It is likely multifactorial secondary to hypoxia/underlinging cellulitis/chronic pain medications. Mental status has improved since admission. EEG has been ordered to rule out any new seizures. He has also been found to have a right groin cellulitis with serous drainage noted from previous biopsy wound.  He is currently on IV abx.  Palliative care has been consulted to help determine further goals of care since he would be very appropriate for hospice. He has has multiple admissions to the hospital within the last few months. Anticipate discharge in next 1-2 days.    Assessment/Plan: 1. Acute encephalopathy secondary to hypoxia/underlinging cellulitis/chronic pain medications. Appears to be improving, although daughter reports he is still having periods of confusion. On further questioning, it sounds like he may be having a more chronic decline in his cognition. She reports that he does occasionally get confused during the afternoon. He is no longer able to drive or manage his finances. He has chronic hypoxic respiratory failure and apparently was not wearing his oxygen prior to admission leading to significant hypoxia which could also have been playing a role.  EEG ordered for further evaluation of possible seizure, based on the shaking of his hands and transient amnesia noted in H+P. CT head was without acute changes. Blood cultures show no growth to date. Continue current treatments. 2. Leukocytosis, stable. Possibly secondary to steroid therapy given with chemotherapy. It may also be associated with mild groin cellulitis. He is afebrile. 3. Right groin cellulitis/seroma, slowly improving. Patient had  a right groin lymph node biopsied recently by Dr. Arnoldo Morale per request by oncology. Over the past week or so, it has been draining a serous fluid per family. Started on Vancomycin in  the ED, will continue treatment. Appears to be improving. I have asked Dr. Arnoldo Morale to evaluate his wounds.  4. Recurrent right malignant pleural effusion. During the previous hospitalization 1.5 L of fluid was drawn off via thoracentesis. His chest x-ray reveals recurrent right pleural effusion. The patient does not appear to be in extremis with regards to his pulmonary status. Will continue to hold off on thoracentesis. 5. Chronic oxygen dependent COPD/chronic respiratory failure with hypoxia. He is treated with oxygen 2.5 L/m continuously, which we will continue. 6. Chronic diastolic heart failure. Currently stable. Will continue Lasix. 7. Chronic atrial fibrillation with mild RVR. Was given 10 mg IV of diltiazem upon admission and then restarted on his home dose.  Will continue anticoagulation. 8. Essential hypertension.Blood pressure is stable. Will continue current treatments.  9. Widespread metastatic lymphoma. The patient is followed by Dr. Whitney Muse. He is still receiving chemotherapy. DO NOT RESUSCITATE was recently established. I had a frank discussion with the patient's daughter reports that he has been having a very difficult time since starting chemotherapy. He has had repeated admissions to the hospital. Case was discussed with Dr. Whitney Muse who agrees that patient would be very appropriate for hospice services. I will request a palliative care consult to discuss goals of care. 10. Right knee pain. Denies any recent falls. XR has been ordered. Will request physical therapy.    Code Status: DNR DVT prophylaxis: Xarelto  Family Communication:Daughter bedside. Discussed with patient who understands and has no concerns at this time. Disposition Plan: Anticipate discharge within 1-2  days   Consultants:    Procedures:  EEG  Antibiotics:  Vancomycin 10/24>>  HPI/Subjective: Feels better and cough cough is improving.   Daughter bedside reports that this morning he was not at his mental baseline. She reports that she stopped him from driving two months ago due to his decreasing mental status. He also stopped paying his own bills around the same time.    Objective: Filed Vitals:   10/20/15 0513  BP: 103/49  Pulse: 105  Temp: 98.3 F (36.8 C)  Resp: 20    Intake/Output Summary (Last 24 hours) at 10/20/15 0841 Last data filed at 10/20/15 0508  Gross per 24 hour  Intake    480 ml  Output    775 ml  Net   -295 ml   Filed Weights   10/18/15 1655 10/19/15 0601 10/20/15 0513  Weight: 85.322 kg (188 lb 1.6 oz) 83.6 kg (184 lb 4.9 oz) 83 kg (182 lb 15.7 oz)    Exam:  General: NAD, looks comfortable  Cardiovascular: Irregular rhythm and regular rate  Respiratory: Diminished breath sound on the right. No wheezing, rales or rhonchi  Abdomen: soft, non tender, no distention , bowel sounds normal  Musculoskeletal: No edema b/l, tender on ROM of right knee. Knee does not appear to have any effusion, redness or warmth.  Data Reviewed: Basic Metabolic Panel:  Recent Labs Lab 10/18/15 1130 10/19/15 0704 10/20/15 0604  NA 142 139 138  K 4.6 3.9 3.8  CL 104 99* 99*  CO2 32 36* 33*  GLUCOSE 95 92 125*  BUN 18 14 13   CREATININE 1.03 1.08 1.00  CALCIUM 8.8* 8.3* 8.4*   Liver Function Tests:  Recent Labs Lab 10/18/15 1130  AST 40  ALT 35  ALKPHOS 75  BILITOT 0.8  PROT 6.0*  ALBUMIN 2.9*    Recent Labs Lab 10/18/15 1300  AMMONIA <9*   CBC:  Recent Labs Lab 10/18/15 1130 10/19/15 0704 10/20/15 0604  WBC 24.7* 15.7* 16.9*  HGB 12.5* 11.2* 11.0*  HCT 39.6 36.2* 34.7*  MCV 101.8* 102.0* 100.3*  PLT 311 218 207   Cardiac Enzymes:  Recent Labs Lab 10/18/15 1130  TROPONINI 0.03   BNP (last 3 results)  Recent Labs   08/22/15 1421 09/13/15 0650 09/29/15 1222  BNP 358.0* 595.0* 226.0*     Recent Results (from the past 240 hour(s))  Wound culture     Status: None (Preliminary result)   Collection Time: 10/18/15 12:29 PM  Result Value Ref Range Status   Specimen Description BUTTOCKS  Final   Special Requests Immunocompromised  Final   Gram Stain   Final    MODERATE WBC PRESENT, PREDOMINANTLY PMN NO SQUAMOUS EPITHELIAL CELLS SEEN FEW GRAM POSITIVE COCCI IN PAIRS Performed at [facility]    Culture   Final    Culture reincubated for better growth Performed at [facility]    Report Status PENDING  Incomplete  Blood culture (routine x 2)     Status: None (Preliminary result)   Collection Time: 10/18/15  1:00 PM  Result Value Ref Range Status   Specimen Description LEFT ANTECUBITAL  Final   Special Requests BOTTLES DRAWN AEROBIC AND ANAEROBIC 6CC  Final   Culture NO GROWTH < 24 HOURS  Final   Report Status PENDING  Incomplete  Blood culture (routine x 2)     Status: None (Preliminary result)   Collection Time: 10/18/15  1:05 PM  Result Value Ref Range Status  Specimen Description BLOOD LEFT FOREARM  Final   Special Requests BOTTLES DRAWN AEROBIC AND ANAEROBIC 6CC   Final   Culture NO GROWTH < 24 HOURS  Final   Report Status PENDING  Incomplete  MRSA PCR Screening     Status: Abnormal   Collection Time: 10/19/15  7:00 PM  Result Value Ref Range Status   MRSA by PCR POSITIVE (A) NEGATIVE Final    Comment:        The GeneXpert MRSA Assay (FDA approved for NASAL specimens only), is one component of a comprehensive MRSA colonization surveillance program. It is not intended to diagnose MRSA infection nor to guide or monitor treatment for MRSA infections. RESULT CALLED TO, READ BACK BY AND VERIFIED WITH: LEWIS D AT 2310 ON 102416 BY FORSYTH K      Studies: Dg Chest 2 View  10/18/2015  CLINICAL DATA:  Disorientation for 2 weeks worse this morning, cursing at family  members EXAM: CHEST  2 VIEW COMPARISON:  10/01/2015 FINDINGS: LEFT subclavian power port with tip projecting over SVC. Upper normal heart size. Atherosclerotic calcification aorta. Pulmonary vascular congestion. Increased RIGHT pleural effusion and basilar atelectasis. Decreased lung volumes versus previous exam. Prominent interstitial markings, potentially accentuated by hypoinflation. No definite acute infiltrate, pleural effusion or pneumothorax. Bones demineralized. IMPRESSION: Increased RIGHT pleural effusion and basilar atelectasis. Chronic interstitial prominence. Electronically Signed   By: Lavonia Dana M.D.   On: 10/18/2015 12:54   Ct Head Wo Contrast  10/18/2015  CLINICAL DATA:  Altered mental status for 2 weeks, headaches. Known history of lupus and large cell lymphoma EXAM: CT HEAD WITHOUT CONTRAST TECHNIQUE: Contiguous axial images were obtained from the base of the skull through the vertex without intravenous contrast. COMPARISON:  09/13/2013 FINDINGS: The bony calvarium is intact. Diffuse atrophic changes are identified and stable. No findings to suggest acute hemorrhage, acute infarction or space-occupying mass lesion are noted. Changes of prior chronic ischemia are noted. IMPRESSION: No acute abnormality noted.  Chronic atrophic and ischemic changes. Electronically Signed   By: Inez Catalina M.D.   On: 10/18/2015 16:06    Scheduled Meds: . allopurinol  300 mg Oral Daily  . antiseptic oral rinse  7 mL Mouth Rinse BID  . digoxin  0.125 mg Oral Daily  . diltiazem  240 mg Oral Daily  . ferrous sulfate  325 mg Oral Daily  . folic acid  1 mg Oral Daily  . furosemide  40 mg Oral Daily  . gabapentin  600 mg Oral BID  . levalbuterol  0.63 mg Nebulization Q8H  . loratadine  10 mg Oral Daily  . pantoprazole  40 mg Oral Daily  . potassium chloride SA  10 mEq Oral Daily  . rivaroxaban  15 mg Oral BID WC  . [START ON 10/26/2015] rivaroxaban  20 mg Oral Q supper  . senna  1 tablet Oral QHS  .  tamsulosin  0.4 mg Oral QHS  . vancomycin  1,500 mg Intravenous Q24H   Continuous Infusions:    Principal Problem:   Acute encephalopathy Active Problems:   Chronic atrial fibrillation (HCC)   Chronic diastolic heart failure (HCC)   Stage III chronic kidney disease   Diffuse large B cell lymphoma (HCC)   Recurrent right pleural effusion   Chronic respiratory failure with hypoxia (HCC)   Leukocytosis   Seroma   Cellulitis of groin    Time spent: 30 minutes   Kathie Dike, MD  [facility] Pager [phone]. If 7PM-7AM, please  contact night-coverage at [email or web address], password [facility] 10/20/2015, 8:41 AM  LOS: 2 days     By signing my name below, I, Rennis Harding, attest that this documentation has bee 11:35n prepared under the direction and in the presence of Kathie Dike, MD. Electronically signed: Rennis Harding, Scribe. 10/20/2015 11:55 AM   I, Dr. Kathie Dike, personally performed the services described in this documentaiton. All medical record entries made by the scribe were at my direction and in my presence. I have reviewed the chart and agree that the record reflects my personal performance and is accurate and complete  Kathie Dike, MD, 10/20/2015 8:41 AM

## 2015-10-20 NOTE — Procedures (Signed)
[place] A. Merlene Laughter, MD     [email or web address]           HISTORY: The patient 81 year old male who presents with acute encephalopathy. He is noted to have shaking of the extremities. He has widely metastatic lymphoma.  MEDICATIONS: Scheduled Meds: Continuous Infusions: PRN Meds:.  Prior to Admission medications   Medication Sig Start Date End Date Taking? Authorizing Provider  albuterol (PROAIR HFA) 108 (90 BASE) MCG/ACT inhaler Inhale 2 puffs into the lungs every 6 (six) hours as needed. Shortness of Breath 08/25/15   Radene Gunning, NP  allopurinol (ZYLOPRIM) 300 MG tablet Take 1 tablet (300 mg total) by mouth daily. 09/18/15   Kathie Dike, MD  cetirizine (ZYRTEC) 10 MG tablet Take 10 mg by mouth daily.      Historical Provider, MD  [place] 125 MCG tablet TAKE ONE TABLET BY MOUTH DAILY 09/28/15   Satira Sark, MD  diltiazem (CARDIZEM CD) 240 MG 24 hr capsule Take 1 capsule (240 mg total) by mouth daily. 10/02/15   Kathie Dike, MD  ferrous sulfate 325 (65 FE) MG tablet Take 325 mg by mouth daily.    Historical Provider, MD  fluticasone (FLONASE) 50 MCG/ACT nasal spray Place 2 sprays into both nostrils daily.  08/25/15   Historical Provider, MD  folic acid (FOLVITE) 1 MG tablet Take 1 mg by mouth daily.     Historical Provider, MD  furosemide (LASIX) 40 MG tablet Take 1 tablet (40 mg total) by mouth daily. Take daily for prevention of fluid buildup. 10/02/15   Kathie Dike, MD  gabapentin (NEURONTIN) 300 MG capsule Take 2 capsules (600 mg total) by mouth 2 (two) times daily. 08/25/15   Radene Gunning, NP  guaiFENesin (MUCINEX) 600 MG 12 hr tablet Take 1 tablet (600 mg total) by mouth 2 (two) times daily. 09/18/15   Kathie Dike, MD  ipratropium-albuterol (DUONEB) 0.5-2.5 (3) MG/3ML SOLN Take 3 mLs by nebulization every 6 (six) hours as needed. Patient taking differently: Take 3 mLs by nebulization every 6 (six) hours as needed (shortness of breath).  09/18/15    Kathie Dike, MD  ketoconazole (NIZORAL) 2 % shampoo Apply 1 application topically 3 (three) times a week.  05/21/13   Historical Provider, MD  lidocaine-prilocaine (EMLA) cream Apply a quarter size amount to port site 1 hour prior to chemo. Do not rub in. Cover with plastic wrap. 09/17/15   Patrici Ranks, MD  Omega-3 Fatty Acids (FISH OIL) 1000 MG CAPS Take 2 capsules by mouth 2 (two) times daily.     Historical Provider, MD  omeprazole (PRILOSEC) 40 MG capsule Take 40 mg by mouth daily.    Historical Provider, MD  ondansetron (ZOFRAN) 8 MG tablet Take 1 tablet every 8 hours as needed for nausea/vomiting. Patient taking differently: Take 8 mg by mouth every 8 (eight) hours as needed for nausea or vomiting.  09/16/15   Baird Cancer, PA-C  Oxycodone HCl 10 MG TABS Take 1 tablet (10 mg total) by mouth every 6 (six) hours as needed. Patient taking differently: Take 10 mg by mouth every 6 (six) hours as needed (pain).  10/12/15   Baird Cancer, PA-C  potassium chloride SA (K-DUR,KLOR-CON) 20 MEQ tablet Take 0.5 tablets (10 mEq total) by mouth daily. Starting 08/27/15. 08/25/15   Radene Gunning, NP  predniSONE (DELTASONE) 20 MG tablet On Days 1-5 of chemo take 4.5 tablets (90mg ) daily. 10/05/15   Baird Cancer, PA-C  prochlorperazine (COMPAZINE)  10 MG tablet Take 1 tablet (10 mg total) by mouth every 6 (six) hours as needed (Nausea or vomiting). Patient taking differently: Take 10 mg by mouth every 6 (six) hours as needed (Nausea or vomiting). nausea 09/16/15   Baird Cancer, PA-C  Rivaroxaban (XARELTO STARTER PACK) 15 & 20 MG TBPK Take as directed on package: Start with one 15mg  tablet by mouth twice a day with food. On Day 22, switch to one 20mg  tablet once a day with food. 10/05/15   Baird Cancer, PA-C  Sennosides-Docusate Sodium (SENOKOT S PO) Take 1 tablet by mouth daily.     Historical Provider, MD  tadalafil (CIALIS) 5 MG tablet Take 5 mg by mouth daily as needed for erectile  dysfunction.  05/18/15   Historical Provider, MD  tamsulosin (FLOMAX) 0.4 MG CAPS capsule Take 1 capsule (0.4 mg total) by mouth at bedtime. For prostate treatment. 05/25/15   Rexene Alberts, MD  Vitamin D, Ergocalciferol, (DRISDOL) 50000 UNITS CAPS capsule Take 1 capsule by mouth once a week. friday 04/27/15   Historical Provider, MD      ANALYSIS: A 16 channel recording using standard 10 20 measurements is conducted for 21 minutes. The background activity gets as high as 5.5 Hz. Most times a recording is 3-4 Hz however showing diffuse slowing. Photic stimulation and hypoventilation are not carried out. The patient is noted to have triphasic waves at times. There is significant amount of vertex sharp wave. There is no focal neurological slowing. The patient is noted to have twitching of the right upper extremity without electrographic correlates. There is a single sharp wave activity that phase reverses at F8.   IMPRESSION: This recording is abnormal for the following reasons: Single epileptiform discharge involving the right anterior temporal area. This can correlates clinically with focal seizures. Consider antiepileptic medications such as Keppra.  Moderately severe generalized slowing indicating a moderate severity encephalopathy.  Occasional triphasic waves typically seen in toxic metabolic encephalopathies.      Kathern Lobosco A. Merlene Laughter, M.D.  Diplomate, Tax adviser of Psychiatry and Neurology ( Neurology).

## 2015-10-21 DIAGNOSIS — R569 Unspecified convulsions: Secondary | ICD-10-CM

## 2015-10-21 DIAGNOSIS — C8330 Diffuse large B-cell lymphoma, unspecified site: Secondary | ICD-10-CM

## 2015-10-21 DIAGNOSIS — R4182 Altered mental status, unspecified: Secondary | ICD-10-CM | POA: Insufficient documentation

## 2015-10-21 DIAGNOSIS — T148 Other injury of unspecified body region: Secondary | ICD-10-CM

## 2015-10-21 DIAGNOSIS — G9340 Encephalopathy, unspecified: Secondary | ICD-10-CM

## 2015-10-21 LAB — CBC
HEMATOCRIT: 32.8 % — AB (ref 39.0–52.0)
Hemoglobin: 10.3 g/dL — ABNORMAL LOW (ref 13.0–17.0)
MCH: 31.6 pg (ref 26.0–34.0)
MCHC: 31.4 g/dL (ref 30.0–36.0)
MCV: 100.6 fL — ABNORMAL HIGH (ref 78.0–100.0)
Platelets: 183 10*3/uL (ref 150–400)
RBC: 3.26 MIL/uL — ABNORMAL LOW (ref 4.22–5.81)
RDW: 17 % — AB (ref 11.5–15.5)
WBC: 16.1 10*3/uL — AB (ref 4.0–10.5)

## 2015-10-21 LAB — BASIC METABOLIC PANEL
Anion gap: 5 (ref 5–15)
BUN: 12 mg/dL (ref 6–20)
CO2: 32 mmol/L (ref 22–32)
CREATININE: 1.02 mg/dL (ref 0.61–1.24)
Calcium: 8.1 mg/dL — ABNORMAL LOW (ref 8.9–10.3)
Chloride: 100 mmol/L — ABNORMAL LOW (ref 101–111)
GFR calc Af Amer: >60 mL/min (ref >60)
GLUCOSE: 124 mg/dL — AB (ref 65–99)
POTASSIUM: 3.7 mmol/L (ref 3.5–5.1)
SODIUM: 137 mmol/L (ref 135–145)

## 2015-10-21 LAB — WOUND CULTURE

## 2015-10-21 MED ORDER — LEVETIRACETAM 500 MG PO TABS: 500.0000 mg | ORAL_TABLET | Freq: Two times a day (BID) | ORAL | Status: DC

## 2015-10-21 MED ORDER — LEVETIRACETAM 500 MG PO TABS: 500.0000 mg | ORAL_TABLET | Freq: Two times a day (BID) | ORAL | Status: AC

## 2015-10-21 MED ORDER — MUPIROCIN CALCIUM 2 % EX CREA
TOPICAL_CREAM | Freq: Two times a day (BID) | CUTANEOUS | Status: DC
  Filled 2015-10-21: qty 15

## 2015-10-21 NOTE — Progress Notes (Addendum)
PROGRESS NOTE  [name] ZES:[id number] DOB: 17-Apr-1931 DOA: 10/18/2015 PCP: [name], MD  Summary: 73 yom PMH DLBCL undergoing chemotherapy, oxygen dependent COPD, pulmonary embolus on anticoagulation with the Xarelto, chronic atrial fibrillation, and chronic diastolic heart failure presented with confusion. He was admitted for further evaluation and management of acute encephalopathy.   Assessment/Plan: 1. Acute encephalopathy, potentially multifactorial. Suspect seizure. Hypoxia considered, ADR to pain medications. Episodes of confusion not wholly acute. Apparently was not wearing his oxygen prior to admission leading to significant hypoxia which could also have been playing a role. CT head negative. EEG was abnormal suggesting seizure activity. 2. Suspected seizure disorder. No evidence of recurrence. CT head negative.  3. DLBCL, Stage IV with associated anemia, malignant right pleural effusion. Stable. Wants to continue chemotherapy. 4. Right groin seroma, culture with MRSA sensitive. S/p right groin lymph node biopsy recently by Dr. Arnoldo Morale.  Dr. Arnoldo Morale evaluated his wound and does not recommend abx at this time. He can follow up with him as an outpatient as needed.   5. Recurrent right malignant pleural effusion. Asymptomatic. Follow-up as an outpatient.  6. Chronic oxygen dependent COPD/chronic respiratory failure with hypoxia. Continue 2.5 LPM [place]. 7. Chronic diastolic heart failure. Stable. Continue Lasix. 8. Chronic atrial fibrillation. Stable.  9. Essential hypertension. Blood pressure is stable.  10. Right knee pain. Denies any recent falls. XR unremarkable. Exam unremarkable except small area of erythema. No effusion. Symptomatically improved and ambulating without difficulty. Consider gout. No further evaluation at this point given spontaneously improvement and no ambulatory difficulty. 11. Rheumatoid arthritis, Enbrel and MTX for "many years" 84. PMH PE.  Continues on Xarelto.   Overall improved, encephalopathy resolved. Discussed with daughter and patient at bedside as well as [place]. Pt wants to continue full treatment, full code. We discussed suspected seizure activity. Given concern for marrow involvement of DLBCL and Dr. Donald Pore comments on intrathecal chemo, have recommended MRI brain (last 2014). Patient wants to go home but is will to consider MRI as an outpatient.   No recurrent seizures. Living with daughter. Start Keppra. Seizure precautions given. Recommend outpatient f/u with Dr. Merlene Laughter.  Code Status: DNR DVT prophylaxis: Xarelto  Family Communication:  Disposition Plan: home with [facility] RN  Murray Hodgkins, MD  [facility]  Pager [phone] If 7PM-7AM, please contact night-coverage at [email or web address], password [facility] 10/21/2015, 7:05 AM  LOS: 3 days   Consultants:  Surgery  PT  Procedures:  EEG IMPRESSION: This recording is abnormal for the following reasons: Single epileptiform discharge involving the right anterior temporal area. This can correlates clinically with focal seizures. Consider antiepileptic medications such as Keppra.  Moderately severe generalized slowing indicating a moderate severity encephalopathy.  Occasional triphasic waves typically seen in toxic metabolic encephalopathies.  Antibiotics:  Vancomycin 10/24>> 10/26  HPI/Subjective: Doing well, eating fine. Confusion essentially resolved per daughter. Right knee pain much better today. Ambulating without difficulty.  Objective: Filed Vitals:   10/20/15 2304 10/21/15 0500 10/21/15 0539 10/21/15 0638  BP:    108/53  Pulse:    88  Temp:    98.6 F (37 C)  TempSrc:    Oral  Resp:    17  Height:      Weight:  82.7 kg (182 lb 5.1 oz)    SpO2: 97%  98% 97%    Intake/Output Summary (Last 24 hours) at 10/21/15 0705 Last data filed at 10/20/15 1900  Gross per 24 hour  Intake    480 ml  Output  700 ml  Net   -220 ml      Filed Weights   10/19/15 0601 10/20/15 0513 10/21/15 0500  Weight: 83.6 kg (184 lb 4.9 oz) 83 kg (182 lb 15.7 oz) 82.7 kg (182 lb 5.1 oz)    Exam:    VSS, afebrile, not hypoxic General:  Appears comfortable, calm. Cardiovascular: Regular rate and rhythm, no murmur, rub or gallop. No lower extremity edema. Respiratory: Clear to auscultation bilaterally, no wheezes, rales or rhonchi. Normal respiratory effort. Abdomen: soft, ntnd Skin: slight erythema right knee. Right groin with open wound with little drainage, no surrounding erythema. Musculoskeletal: grossly normal tone bilateral upper and lower extremities; bends right knee to 90. No effusion. Full extension noted. No effusion noted. Psychiatric: grossly normal mood and affect, speech fluent and appropriate. Oriented to self, location, month, year Neurologic: grossly non-focal.  New data reviewed:  BMP unremarkable  Hgb stable 10.3, WBC 16,1  Pertinent data since admission:  MRSA positive  Initial WBC 24.7  CXR IMPRESSION: Increased RIGHT pleural effusion and basilar atelectasis. Chronic interstitial prominence.  Pending data:  BC/WC  Scheduled Meds: . allopurinol  300 mg Oral Daily  . antiseptic oral rinse  7 mL Mouth Rinse BID  . Chlorhexidine Gluconate Cloth  6 each Topical Q0600  . digoxin  0.125 mg Oral Daily  . diltiazem  240 mg Oral Daily  . ferrous sulfate  325 mg Oral Daily  . folic acid  1 mg Oral Daily  . furosemide  40 mg Oral Daily  . gabapentin  600 mg Oral BID  . levalbuterol  0.63 mg Nebulization Q8H  . loratadine  10 mg Oral Daily  . mupirocin ointment  1 application Nasal BID  . pantoprazole  40 mg Oral Daily  . potassium chloride SA  10 mEq Oral Daily  . rivaroxaban  15 mg Oral BID WC  . [START ON 10/26/2015] rivaroxaban  20 mg Oral Q supper  . senna  1 tablet Oral QHS  . tamsulosin  0.4 mg Oral QHS  . vancomycin  1,500 mg Intravenous Q24H   Continuous Infusions:   Principal  Problem:   Acute encephalopathy Active Problems:   Chronic atrial fibrillation (HCC)   Chronic diastolic heart failure (HCC)   Stage III chronic kidney disease   Diffuse large B cell lymphoma (HCC)   Recurrent right pleural effusion   Chronic respiratory failure with hypoxia (HCC)   Leukocytosis   Seroma   Cellulitis of groin     By signing my name below, I, Rosalie Doctor attest that this documentation has been prepared under the direction and in the presence of Murray Hodgkins, MD Electronically signed: Rosalie Doctor, Scribe.  10/21/2015  I personally performed the services described in this documentation. All medical record entries made by the scribe were at my direction. I have reviewed the chart and agree that the record reflects my personal performance and is accurate and complete. Murray Hodgkins, MD

## 2015-10-21 NOTE — Care Management Important Message (Signed)
Important Message  Patient Details  Name: OUSMAN DISE MRN: [id number] Date of Birth: 1931-07-11   Medicare Important Message Given:  Yes-second notification given    Joylene Draft, RN 10/21/2015, 3:34 PM

## 2015-10-21 NOTE — Progress Notes (Signed)
PT Cancellation Note  Patient Details Name: [name] MRN: [id number] DOB: 1931-02-09   Cancelled Treatment:    Reason Eval/Treat Not Completed: Patient not medically ready;Other (comment). New PT orders have be placed since pain and swelling in knee have gotten worse since admission. Imaging studies are unremarkable. In light of pt originally not recommending any follow up at initial evaluation, PT is recommending ortho consult at this time to further investigate knee problem. If patient knee pain is not significantly limiting to mobility, PT evaluation in the outpatient setting would be more appropriate for evaluation and treatment of this current problem.     Dasiah Hooley C 10/21/2015, 11:43 AM  11:46 AM  Etta Grandchild, PT, DPT [place] License # [id number]

## 2015-10-21 NOTE — Consult Note (Signed)
Consultation Note Date: 10/21/2015   Patient Name: [name]  DOB: 1931-02-09  MRN: [id number]  Age / Sex: 81 y.o., male   PCP: Dione Housekeeper, MD Referring Physician: Samuella Cota, MD  Reason for Consultation: Establishing goals of care and Psychosocial/spiritual support  Palliative Care Assessment and Plan Summary of Established Goals of Care and Medical Treatment Preferences   Clinical Assessment/Narrative: [name] is lying in bed watching TV, his daughter [name] is at bedside.  We talk about his current chemo regimen. He tells me some about his life and his desire to continue with chemo, telling me that he doesn't think he will stop as long as he is still able to take chemo.  I share that we are supporting his decisions to continue/ or stop if/when he so desires.  We talk about HCPOA and he tells me that he hasn't completed paperwork, but would think daughter [name] would be his choice.  We talk about his home pain management (scheduled and PRN meds effective) and also his bowel regimen (not effective).  We discuss increasing Senna-S to 2 tabs BID.    We talk about his wife who also had chemo and he tells me she said, "I took my last dose", and they decided to take each day, doing what they wanted.  He tells me that he doesn't think he will stop chemo.  Daughter [name] tells me that Hospice helped her mother in the last few weeks and she thinks Hospice is wonderful.  She tells me that she will be able to call them if/when needed.  Mr. Woessner is getting ready to eat lunch, I ask him to let us know if we can help in any way.  Thank you for early consult and relationship building for this patient and family.    Contacts/Participants in Discussion: Primary Decision Maker: [name] is able to make his own decisions at this time.    HCPOA: no  Daughter [name] has AD/HCPOA booklet with her.  We reviewed this paperwork.  [name] states he thinks [name] should be his HCPOA.  I advise that  he does not have to chose, but if he does not then medical decisions will fall to his children for consensus.   Code Status/Advance Care Planning:  DNR  Chemotherapy, "I'm ready" "If I'm gonna take chemo, I'm gonna go full force".    We discussed that he has a right to stop if/when he wants.   We discuss Hospice benefits, (no cost at home).    [name] and [name] tell me about [name] deciding to stop chemo and having Hospice in home.   Symptom Management:   Oxycodone 10 mg PO Q 4 hours PRN.  He tells me that he is taking pain meds scheduled and PRN at home.   Zofran 4 mg PO/IV Q 6 hours PRN.   Palliative Prophylaxis: Senna-S 1 tab BID.  States not effective. Increase to 2 tabs BID.   Psycho-social/Spiritual:   Support System: Lives with daughter [name] in her home, he still has his own home.  Daughter [name] 'does errands'.  Has 2 sons and 4 daughters.   Desire for further Chaplaincy support:no  Prognosis: Unable to determine  Discharge Planning:  Home with Home Health       Chief Complaint:  Reported confusion at home. History of Present Illness:  DEMONTA Mann is a 81 y.o. male with a history of metastatic lymphoma undergoing chemotherapy, oxygen dependent COPD, pulmonary embolus  on anticoagulation with the Xarelto, chronic atrial fibrillation, and chronic diastolic heart failure, who presents to the emergency department with reported confusion. The patient himself denies confusion. He does not believe he needs to be in the hospital. The history is being provided by both his daughters and the patient. Accordingly, the patient's daughter who lives with him found him early this morning sitting on the toilet with the lid down and with underwear soiled with urine. He was confused and disoriented per the family. His daughter helped him back to bed. She noted that he was jerking his hand or that his hands were more shaky than usual. He did not seem to recognize her for a couple  of minutes. There was no obvious loss of consciousness. His daughter does not believe that he was sleepwalking. There has been no reported confusion with him taking his medications as his daughter administers his chronic medications. He has had no recent nausea, vomiting, abdominal pain, diarrhea, fever, chills, or pain with urination. The patient himself has no complaints with exception of some occasional side pain. He had not been wearing his oxygen this morning when he get up to used to bathroom.  He has been treated wit IV fluids and antibiotics.   Primary Diagnoses  Present on Admission:  . Acute encephalopathy . Diffuse large B cell lymphoma ([place]) . Chronic atrial fibrillation ([place]) . Stage III chronic kidney disease . Recurrent right pleural effusion . Chronic diastolic heart failure ([place]) . Chronic respiratory failure with hypoxia ([place]) . Leukocytosis . Cellulitis of groin  Palliative Review of Systems: [name] is complaining of 9/10 back pain, PRN dose requested.  I have reviewed the medical record, interviewed the patient and family, and examined the patient. The following aspects are pertinent.  Past Medical History  Diagnosis Date  . COPD (chronic obstructive pulmonary disease) (HCC)     Uses occasional nighttime O2  . Disseminated herpes zoster 2010  . GERD (gastroesophageal reflux disease)   . Asthma   . Pulmonary embolus ([place])     2003 and 2011  . Pulmonary nodules     Chest CT 09/11  . Mixed hyperlipidemia   . Chronic atrial fibrillation ([place])   . Lupus anticoagulant positive   . Essential hypertension, benign   . Cholelithiasis   . Rheumatoid arthritis(714.0)   . History of chicken pox 1941; 2011  . Anemia   . Chronic lower back pain   . Chronic diastolic heart failure ([place])   . Cirrhosis ([place])     Questionable, AFP normal Feb 01/2012, hx of ETOH use   . Headache(784.0)   . Cervical spondylosis without myelopathy   . Stage III chronic kidney disease   .  Cellulitis of leg, left 01/03/2015  . Aortic stenosis, moderate 05/21/2015  . Dysrhythmia     chronic AFib  . Diffuse large B cell lymphoma ([place]) 08/22/2015   Social History   Social History  . Marital Status: Widowed    Spouse Name: N/A  . Number of Children: 6  . Years of Education: 7th   Occupational History  . retired   . RETIRED    Social History Main Topics  . Smoking status: Former Smoker -- 1.00 packs/day for 57 years    Types: Cigarettes    Quit date: 01/27/1998  . Smokeless tobacco: Never Used  . Alcohol Use: No     Comment: "quit drinking 1976"  . Drug Use: No  . Sexual Activity: Not Currently   Other  Topics Concern  . None   Social History Narrative   Family History  Problem Relation Age of Onset  . Colon cancer Neg Hx   . Liver disease Neg Hx   . Diabetes Mother    Scheduled Meds: . allopurinol  300 mg Oral Daily  . antiseptic oral rinse  7 mL Mouth Rinse BID  . Chlorhexidine Gluconate Cloth  6 each Topical Q0600  . digoxin  0.125 mg Oral Daily  . diltiazem  240 mg Oral Daily  . ferrous sulfate  325 mg Oral Daily  . folic acid  1 mg Oral Daily  . furosemide  40 mg Oral Daily  . gabapentin  600 mg Oral BID  . levalbuterol  0.63 mg Nebulization Q8H  . loratadine  10 mg Oral Daily  . mupirocin cream   Topical BID  . mupirocin ointment  1 application Nasal BID  . pantoprazole  40 mg Oral Daily  . potassium chloride SA  10 mEq Oral Daily  . rivaroxaban  15 mg Oral BID WC  . [START ON 10/26/2015] rivaroxaban  20 mg Oral Q supper  . senna  1 tablet Oral QHS  . tamsulosin  0.4 mg Oral QHS  . vancomycin  1,500 mg Intravenous Q24H   Continuous Infusions:  PRN Meds:.acetaminophen **OR** acetaminophen, alum & mag hydroxide-simeth, guaiFENesin-dextromethorphan, hydrALAZINE, ondansetron **OR** ondansetron (ZOFRAN) IV, oxyCODONE Medications Prior to Admission:  Prior to Admission medications   Medication Sig Start Date End Date Taking? Authorizing Provider    albuterol (PROAIR HFA) 108 (90 BASE) MCG/ACT inhaler Inhale 2 puffs into the lungs every 6 (six) hours as needed. Shortness of Breath 08/25/15  Yes Radene Gunning, NP  allopurinol (ZYLOPRIM) 300 MG tablet Take 1 tablet (300 mg total) by mouth daily. 09/18/15  Yes Kathie Dike, MD  cetirizine (ZYRTEC) 10 MG tablet Take 10 mg by mouth daily.     Yes Historical Provider, MD  [facility] 125 MCG tablet TAKE ONE TABLET BY MOUTH DAILY 09/28/15  Yes Satira Sark, MD  diltiazem (CARDIZEM CD) 240 MG 24 hr capsule Take 1 capsule (240 mg total) by mouth daily. 10/02/15  Yes Kathie Dike, MD  ferrous sulfate 325 (65 FE) MG tablet Take 325 mg by mouth daily.   Yes Historical Provider, MD  fluticasone (FLONASE) 50 MCG/ACT nasal spray Place 2 sprays into both nostrils daily.  08/25/15  Yes Historical Provider, MD  folic acid (FOLVITE) 1 MG tablet Take 1 mg by mouth daily.    Yes Historical Provider, MD  furosemide (LASIX) 40 MG tablet Take 1 tablet (40 mg total) by mouth daily. Take daily for prevention of fluid buildup. 10/02/15  Yes Kathie Dike, MD  gabapentin (NEURONTIN) 300 MG capsule Take 2 capsules (600 mg total) by mouth 2 (two) times daily. 08/25/15  Yes Lezlie Octave Black, NP  guaiFENesin (MUCINEX) 600 MG 12 hr tablet Take 1 tablet (600 mg total) by mouth 2 (two) times daily. 09/18/15  Yes Kathie Dike, MD  ipratropium-albuterol (DUONEB) 0.5-2.5 (3) MG/3ML SOLN Take 3 mLs by nebulization every 6 (six) hours as needed. Patient taking differently: Take 3 mLs by nebulization every 6 (six) hours as needed (shortness of breath).  09/18/15  Yes Kathie Dike, MD  ketoconazole (NIZORAL) 2 % shampoo Apply 1 application topically 3 (three) times a week.  05/21/13  Yes Historical Provider, MD  lidocaine-prilocaine (EMLA) cream Apply a quarter size amount to port site 1 hour prior to chemo. Do not rub in. Cover  with plastic wrap. 09/17/15  Yes Patrici Ranks, MD  Omega-3 Fatty Acids (FISH OIL) 1000 MG CAPS Take 2  capsules by mouth 2 (two) times daily.    Yes Historical Provider, MD  omeprazole (PRILOSEC) 40 MG capsule Take 40 mg by mouth daily.   Yes Historical Provider, MD  ondansetron (ZOFRAN) 8 MG tablet Take 1 tablet every 8 hours as needed for nausea/vomiting. Patient taking differently: Take 8 mg by mouth every 8 (eight) hours as needed for nausea or vomiting.  09/16/15  Yes Baird Cancer, PA-C  Oxycodone HCl 10 MG TABS Take 1 tablet (10 mg total) by mouth every 6 (six) hours as needed. Patient taking differently: Take 10 mg by mouth every 6 (six) hours as needed (pain).  10/12/15  Yes Manon Hilding Kefalas, PA-C  potassium chloride SA (K-DUR,KLOR-CON) 20 MEQ tablet Take 0.5 tablets (10 mEq total) by mouth daily. Starting 08/27/15. 08/25/15  Yes Lezlie Octave Black, NP  predniSONE (DELTASONE) 20 MG tablet On Days 1-5 of chemo take 4.5 tablets (90mg ) daily. 10/05/15  Yes Baird Cancer, PA-C  prochlorperazine (COMPAZINE) 10 MG tablet Take 1 tablet (10 mg total) by mouth every 6 (six) hours as needed (Nausea or vomiting). Patient taking differently: Take 10 mg by mouth every 6 (six) hours as needed (Nausea or vomiting). nausea 09/16/15  Yes Baird Cancer, PA-C  Rivaroxaban (XARELTO STARTER PACK) 15 & 20 MG TBPK Take as directed on package: Start with one 15mg  tablet by mouth twice a day with food. On Day 22, switch to one 20mg  tablet once a day with food. 10/05/15  Yes Baird Cancer, PA-C  Sennosides-Docusate Sodium (SENOKOT S PO) Take 1 tablet by mouth daily.    Yes Historical Provider, MD  tadalafil (CIALIS) 5 MG tablet Take 5 mg by mouth daily as needed for erectile dysfunction.  05/18/15  Yes Historical Provider, MD  tamsulosin (FLOMAX) 0.4 MG CAPS capsule Take 1 capsule (0.4 mg total) by mouth at bedtime. For prostate treatment. 05/25/15  Yes Rexene Alberts, MD  Vitamin D, Ergocalciferol, (DRISDOL) 50000 UNITS CAPS capsule Take 1 capsule by mouth once a week. friday 04/27/15  Yes Historical Provider, MD    Allergies  Allergen Reactions  . Cymbalta [Duloxetine Hcl] Other (See Comments)    Confusion   . Procaine Hcl Hives and Other (See Comments)    NOVOCAINE: Sweating, Confusion, Not in right state of mind.  [name]    unknown   CBC:    Component Value Date/Time   WBC 16.1* 10/21/2015 0648   HGB 10.3* 10/21/2015 0648   HCT 32.8* 10/21/2015 0648   HCT 34 01/11/2012 1007   PLT 183 10/21/2015 0648   MCV 100.6* 10/21/2015 0648   MCV 92.0 01/11/2012 1007   NEUTROABS 13.9* 10/12/2015 1022   LYMPHSABS 0.6* 10/12/2015 1022   MONOABS 0.6 10/12/2015 1022   EOSABS 0.1 10/12/2015 1022   BASOSABS 0.1 10/12/2015 1022   Comprehensive Metabolic Panel:    Component Value Date/Time   NA 137 10/21/2015 0648   NA 140 01/11/2012 1005   K 3.7 10/21/2015 0648   K 4.5 01/11/2012 1005   CL 100* 10/21/2015 0648   CO2 32 10/21/2015 0648   BUN 12 10/21/2015 0648   BUN 12 01/11/2012 1005   CREATININE 1.02 10/21/2015 0648   CREATININE 1.17 01/11/2012 1005   GLUCOSE 124* 10/21/2015 0648   CALCIUM 8.1* 10/21/2015 0648   CALCIUM 9.2 01/11/2012 1005  AST 40 10/18/2015 1130   ALT 35 10/18/2015 1130   ALKPHOS 75 10/18/2015 1130   BILITOT 0.8 10/18/2015 1130   PROT 6.0* 10/18/2015 1130   ALBUMIN 2.9* 10/18/2015 1130    Physical Exam: Vital Signs: BP 108/53 mmHg  Pulse 88  Temp(Src) 98.6 F (37 C) (Oral)  Resp 17  Ht 5\' 8"  (1.727 m)  Wt 82.7 kg (182 lb 5.1 oz)  BMI 27.73 kg/m2  SpO2 96% SpO2: SpO2: 96 % O2 Device: O2 Device: Nasal Cannula O2 Flow Rate: O2 Flow Rate (L/min): 2.5 L/min Intake/output summary:  Intake/Output Summary (Last 24 hours) at 10/21/15 1438 Last data filed at 10/21/15 0830  Gross per 24 hour  Intake    480 ml  Output    700 ml  Net   -220 ml   LBM: Last BM Date: 10/19/15 Baseline Weight: Weight: 88.451 kg (195 lb) Most recent weight: Weight: 82.7 kg (182 lb 5.1 oz)  Exam Findings:  Constitutional:  Elderly, frail  appearing.  Makes eye contact.  Resp:  Even and non labored.  Cardio:  No edema.  Neuro: grossly intact.          Palliative Performance Scale: 60%              Additional Data Reviewed: Recent Labs     10/20/15  0604  10/21/15  0648  WBC  16.9*  16.1*  HGB  11.0*  10.3*  PLT  207  183  NA  138  137  BUN  13  12  CREATININE  1.00  1.02     Time In: 1045 Time Out: 1145 Time Total: 60 minutes Greater than 50%  of this time was spent counseling and coordinating care related to the above assessment and plan. GOC discussion shared with nursing staff, CM, SW, and Dr. Sarajane Jews on next rounds.   Signed by: Drue Novel, NP  Drue Novel, NP  10/21/2015, 2:38 PM  Please contact Palliative Medicine Team phone at [phone] for questions and concerns.

## 2015-10-21 NOTE — Discharge Summary (Signed)
Physician Discharge Summary  [name] VHQ:[id number] DOB: 11/15/1931 DOA: 10/18/2015  PCP: Tony Mustache, MD  Admit date: 10/18/2015 Discharge date: 10/21/2015  Recommendations for Outpatient Follow-up:  1. Follow-up EEG suggestive of new diagnosis of seizure disorder. This was discussed with patient and daughter with recommendation for MRI brain which they desired to pursue as outpaitent. Plan outpatient follow-up with neurology 2. Close outpatient follow-up with oncology. Have sent message to discuss with Dr. Whitney Muse. 3. Resolution of right groin seroma.    Follow-up Information    Follow up with [name].   Contact information:   [address] [place] [address] [phone]       Follow up with Tony Mustache, MD.   Specialty:  Family Medicine   Why:  As needed   Contact information:   [place] [address] [phone]       Follow up with [facility], KOFI, MD. Schedule an appointment as soon as possible for a visit in 1 month.   Specialty:  Neurology   Contact information:   [address] [place] [address] [phone]       Follow up with Tony Hazard, MD.   Specialties:  Hematology and Oncology, Oncology   Why:  office will call you with appt   Contact information:   [place] [address] [phone]      Discharge Diagnoses:  1. Acute encephalopathy 2. New onset seizure disorder 3. DLBCL stage IV 4. Right groin seroma 5. Recurrent malignant right pleural effusion. 6. COPD 7. Chronic hypoxic respiratory failure 8. Chronic diastolic CHF 9. Chronic atrial fibrillation 10. Right new pain.   Discharge Condition: improved Disposition: home with [facility] RN  Diet recommendation: low salt diet  Filed Weights   10/19/15 0601 10/20/15 0513 10/21/15 0500  Weight: 83.6 kg (184 lb 4.9 oz) 83 kg (182 lb 15.7 oz) 82.7 kg (182 lb 5.1 oz)    History of present  illness:  18 yom PMH DLBCL undergoing chemotherapy, oxygen dependent COPD, pulmonary embolus on anticoagulation with Xarelto, chronic atrial fibrillation, and chronic diastolic heart failure presented with confusion. He was admitted for further evaluation and management of acute encephalopathy.  Hospital Course:  Acute encephalopathy appeared to rapidly resolve and decline in cognition was postulated. There was no evidence of infeciton. CT of the head was unremarkable. Medications were thought to be possibly contributing. EEG was performed which did suggest seizure activity. He had no seizure activity during this hospitalization. He was started empirically on Keppra. I discussed seizure precautions with him and his daughter at bedside including no driving (still driving at home), no ladders, no open water swimming, no operating heavy machinery or performing other activities where a seizure could be catastrophic. He and his daughter expressed understanding of this. Given the patient's known malignancy and consideration given to intrathecal therapy I suggested pursuing an MRI of the brain prior to discharge but he declines this and wants to go home reporting he will do this as an outpatient. In regard to the seroma, this was evaluated by Dr. Arnoldo Mann and not felt to require antibiotics. The culture results are noted to grow MRSA but there is no surrounding erythema and incision and drainage (open wound is draining spontaneously) is sufficient treatment, he does not require further antibiotics.  1. Acute encephalopathy, potentially multifactorial. Suspect seizure. Hypoxia considered, ADR to pain medications. Episodes of confusion not wholly acute. Apparently was not wearing his oxygen prior to admission  leading to significant hypoxia which could also have been playing a role. CT head negative. EEG was abnormal suggesting seizure activity. 2. Suspected seizure disorder. No evidence of recurrence. CT head negative.   3. DLBCL, Stage IV with associated anemia, malignant right pleural effusion. Stable. Wants to continue chemotherapy. 4. Right groin seroma, culture with MRSA sensitive. S/p right groin lymph node biopsy recently by Dr. Arnoldo Mann. Dr. Arnoldo Mann evaluated his wound and does not recommend abx at this time. He can follow up with him as an outpatient as needed.  5. Recurrent right malignant pleural effusion. Asymptomatic. Follow-up as an outpatient.  6. Chronic oxygen dependent COPD/chronic respiratory failure with hypoxia. Continue 2.5 LPM [place]. 7. Chronic diastolic heart failure. Stable. Continue Lasix. 8. Chronic atrial fibrillation. Stable.  9. Essential hypertension. Blood pressure is stable.  10. Right knee pain. Denies any recent falls. XR unremarkable. PT consulted.  11. Rheumatoid arthritis, Enbrel and MTX for "many years" 37. PMH PE. Continues on Xarelto.   Overall improved, encephalopathy resolved. Discussed with daughter and patient at bedside as well as [name]. Pt wants to continue full treatment, full code. We discussed suspected seizure activity. Given concern for marrow involvement of DLBCL and Dr. Donald Mann comments on intrathecal chemo, have recommended MRI brain (last 2014). Patient wants to go home but is willing to pursue MRI as an outpatient.   No recurrent seizures. Living with daughter. Start Keppra. Seizure precautions given. Recommend outpatient f/u with Dr. Merlene Mann.  Consultants:  Surgery  PT  Procedures:  EEG IMPRESSION: This recording is abnormal for the following reasons: Single epileptiform discharge involving the right anterior temporal area. This can correlates clinically with focal seizures. Consider antiepileptic medications such as Keppra.  Moderately severe generalized slowing indicating a moderate severity encephalopathy.  Occasional triphasic waves typically seen in toxic metabolic encephalopathies.  Antibiotics:  Vancomycin 10/24>>  10/26  Discharge Instructions  Discharge Instructions    Diet - low sodium heart healthy    Complete by:  As directed      Discharge instructions    Complete by:  As directed   Call your physician or seek immediate medical attention for confusion, falls, fever, shortness of breath or worsening of condition.     Increase activity slowly    Complete by:  As directed           Discharge Medication List as of 10/21/2015  4:19 PM    START taking these medications   Details  levETIRAcetam (KEPPRA) 500 MG tablet Take 1 tablet (500 mg total) by mouth 2 (two) times daily., Starting 10/21/2015, Until Discontinued, Normal      CONTINUE these medications which have NOT CHANGED   Details  albuterol (PROAIR HFA) 108 (90 BASE) MCG/ACT inhaler Inhale 2 puffs into the lungs every 6 (six) hours as needed. Shortness of Breath, Starting 08/25/2015, Until Discontinued, Print    allopurinol (ZYLOPRIM) 300 MG tablet Take 1 tablet (300 mg total) by mouth daily., Starting 09/18/2015, Until Discontinued, Normal    cetirizine (ZYRTEC) 10 MG tablet Take 10 mg by mouth daily.  , Until Discontinued, Historical Med    DIGOX 125 MCG tablet TAKE ONE TABLET BY MOUTH DAILY, Normal    diltiazem (CARDIZEM CD) 240 MG 24 hr capsule Take 1 capsule (240 mg total) by mouth daily., Starting 10/02/2015, Until Discontinued, Normal    ferrous sulfate 325 (65 FE) MG tablet Take 325 mg by mouth daily., Until Discontinued, Historical Med    fluticasone (FLONASE) 50 MCG/ACT nasal spray  Place 2 sprays into both nostrils daily. , Starting 08/25/2015, Until Discontinued, Historical Med    folic acid (FOLVITE) 1 MG tablet Take 1 mg by mouth daily. , Until Discontinued, Historical Med    furosemide (LASIX) 40 MG tablet Take 1 tablet (40 mg total) by mouth daily. Take daily for prevention of fluid buildup., Starting 10/02/2015, Until Discontinued, No Print    gabapentin (NEURONTIN) 300 MG capsule Take 2 capsules (600 mg total) by  mouth 2 (two) times daily., Starting 08/25/2015, Until Discontinued, Normal    ipratropium-albuterol (DUONEB) 0.5-2.5 (3) MG/3ML SOLN Take 3 mLs by nebulization every 6 (six) hours as needed., Starting 09/18/2015, Until Discontinued, Normal    ketoconazole (NIZORAL) 2 % shampoo Apply 1 application topically 3 (three) times a week. , Starting 05/21/2013, Until Discontinued, Historical Med    lidocaine-prilocaine (EMLA) cream Apply a quarter size amount to port site 1 hour prior to chemo. Do not rub in. Cover with plastic wrap., Normal    Omega-3 Fatty Acids (FISH OIL) 1000 MG CAPS Take 2 capsules by mouth 2 (two) times daily. , Until Discontinued, Historical Med    omeprazole (PRILOSEC) 40 MG capsule Take 40 mg by mouth daily., Until Discontinued, Historical Med    ondansetron (ZOFRAN) 8 MG tablet Take 1 tablet every 8 hours as needed for nausea/vomiting., Normal    Oxycodone HCl 10 MG TABS Take 1 tablet (10 mg total) by mouth every 6 (six) hours as needed., Starting 10/12/2015, Until Discontinued, Print    potassium chloride SA (K-DUR,KLOR-CON) 20 MEQ tablet Take 0.5 tablets (10 mEq total) by mouth daily. Starting 08/27/15., Starting 08/25/2015, Until Discontinued, No Print    predniSONE (DELTASONE) 20 MG tablet On Days 1-5 of chemo take 4.5 tablets (90mg ) daily., Print    prochlorperazine (COMPAZINE) 10 MG tablet Take 1 tablet (10 mg total) by mouth every 6 (six) hours as needed (Nausea or vomiting)., Starting 09/16/2015, Until Discontinued, Normal    Rivaroxaban (XARELTO STARTER PACK) 15 & 20 MG TBPK Take as directed on package: Start with one 15mg  tablet by mouth twice a day with food. On Day 22, switch to one 20mg  tablet once a day with food., Print    Sennosides-Docusate Sodium (SENOKOT S PO) Take 1 tablet by mouth daily. , Until Discontinued, Historical Med    tadalafil (CIALIS) 5 MG tablet Take 5 mg by mouth daily as needed for erectile dysfunction. , Starting 05/18/2015, Until Discontinued,  Historical Med    tamsulosin (FLOMAX) 0.4 MG CAPS capsule Take 1 capsule (0.4 mg total) by mouth at bedtime. For prostate treatment., Starting 05/25/2015, Until Discontinued, Print    Vitamin D, Ergocalciferol, (DRISDOL) 50000 UNITS CAPS capsule Take 1 capsule by mouth once a week. friday, Starting 04/27/2015, Until Discontinued, Historical Med      STOP taking these medications     guaiFENesin (MUCINEX) 600 MG 12 hr tablet        Allergies  Allergen Reactions  . Cymbalta [Duloxetine Hcl] Other (See Comments)    Confusion   . Procaine Hcl Hives and Other (See Comments)    NOVOCAINE: Sweating, Confusion, Not in right state of mind.  [name]    unknown    The results of significant diagnostics from this hospitalization (including imaging, microbiology, ancillary and laboratory) are listed below for reference.    Significant Diagnostic Studies: Dg Chest 2 View  10/18/2015  CLINICAL DATA:  Disorientation for 2 weeks worse this morning, cursing at family members  EXAM: CHEST  2 VIEW COMPARISON:  10/01/2015 FINDINGS: LEFT subclavian power port with tip projecting over SVC. Upper normal heart size. Atherosclerotic calcification aorta. Pulmonary vascular congestion. Increased RIGHT pleural effusion and basilar atelectasis. Decreased lung volumes versus previous exam. Prominent interstitial markings, potentially accentuated by hypoinflation. No definite acute infiltrate, pleural effusion or pneumothorax. Bones demineralized. IMPRESSION: Increased RIGHT pleural effusion and basilar atelectasis. Chronic interstitial prominence. Electronically Signed   By: Lavonia Dana M.D.   On: 10/18/2015 12:54   Ct Head Wo Contrast  10/18/2015  CLINICAL DATA:  Altered mental status for 2 weeks, headaches. Known history of lupus and large cell lymphoma EXAM: CT HEAD WITHOUT CONTRAST TECHNIQUE: Contiguous axial images were obtained from the base of the skull through the vertex  without intravenous contrast. COMPARISON:  09/13/2013 FINDINGS: The bony calvarium is intact. Diffuse atrophic changes are identified and stable. No findings to suggest acute hemorrhage, acute infarction or space-occupying mass lesion are noted. Changes of prior chronic ischemia are noted. IMPRESSION: No acute abnormality noted.  Chronic atrophic and ischemic changes. Electronically Signed   By: Inez Catalina M.D.   On: 10/18/2015 16:06    Dg Knee Complete 4 Views Right  10/20/2015  CLINICAL DATA:  Right knee pain, initial encounter EXAM: RIGHT KNEE - COMPLETE 4+ VIEW COMPARISON:  None. FINDINGS: No acute fracture or dislocation is noted. Vascular calcifications are seen. 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 No joint effusion is noted. IMPRESSION: No acute abnormality seen. Electronically Signed   By: Inez Catalina M.D.   On: 10/20/2015 16:32   Microbiology: Recent Results (from the past 240 hour(s))  Wound culture     Status: None   Collection Time: 10/18/15 12:29 PM  Result Value Ref Range Status   Specimen Description BUTTOCKS  Final   Special Requests Immunocompromised  Final   Gram Stain   Final    MODERATE WBC PRESENT, PREDOMINANTLY PMN NO SQUAMOUS EPITHELIAL CELLS SEEN FEW GRAM POSITIVE COCCI IN PAIRS Performed at [facility]    Culture   Final    ABUNDANT METHICILLIN RESISTANT STAPHYLOCOCCUS AUREUS Note: RIFAMPIN AND GENTAMICIN SHOULD NOT BE USED AS SINGLE DRUGS FOR TREATMENT OF STAPH INFECTIONS. Performed at [facility]    Report Status 10/21/2015 FINAL  Final   Organism ID, Bacteria METHICILLIN RESISTANT STAPHYLOCOCCUS AUREUS  Final      Susceptibility   Methicillin resistant staphylococcus aureus - MIC*    CLINDAMYCIN >=8 RESISTANT Resistant     ERYTHROMYCIN >=8 RESISTANT Resistant     GENTAMICIN <=0.5 SENSITIVE Sensitive     LEVOFLOXACIN 0.25 SENSITIVE Sensitive     OXACILLIN RESISTANT      RIFAMPIN <=0.5 SENSITIVE Sensitive      TRIMETH/SULFA <=10 SENSITIVE Sensitive     VANCOMYCIN 1 SENSITIVE Sensitive     TETRACYCLINE <=1 SENSITIVE Sensitive     * ABUNDANT METHICILLIN RESISTANT STAPHYLOCOCCUS AUREUS  Blood culture (routine x 2)     Status: None (Preliminary result)   Collection Time: 10/18/15  1:00 PM  Result Value Ref Range Status   Specimen Description BLOOD LEFT ANTECUBITAL  Final   Special Requests BOTTLES DRAWN AEROBIC AND ANAEROBIC 6CC  Final   Culture NO GROWTH 3 DAYS  Final   Report Status PENDING  Incomplete  Blood culture (routine x 2)     Status: None (Preliminary result)   Collection Time: 10/18/15  1:05 PM  Result Value Ref Range Status   Specimen Description BLOOD LEFT FOREARM  Final  Special Requests BOTTLES DRAWN AEROBIC AND ANAEROBIC 6CC   Final   Culture NO GROWTH 3 DAYS  Final   Report Status PENDING  Incomplete  MRSA PCR Screening     Status: Abnormal   Collection Time: 10/19/15  7:00 PM  Result Value Ref Range Status   MRSA by PCR POSITIVE (A) NEGATIVE Final    Comment:        The GeneXpert MRSA Assay (FDA approved for NASAL specimens only), is one component of a comprehensive MRSA colonization surveillance program. It is not intended to diagnose MRSA infection nor to guide or monitor treatment for MRSA infections. RESULT CALLED TO, READ BACK BY AND VERIFIED WITH: LEWIS D AT 2310 ON [id number] BY FORSYTH K      Labs: Basic Metabolic Panel:  Recent Labs Lab 10/18/15 1130 10/19/15 0704 10/20/15 0604 10/21/15 0648  NA 142 139 138 137  K 4.6 3.9 3.8 3.7  CL 104 99* 99* 100*  CO2 32 36* 33* 32  GLUCOSE 95 92 125* 124*  BUN 18 14 13 12   CREATININE 1.03 1.08 1.00 1.02  CALCIUM 8.8* 8.3* 8.4* 8.1*   Liver Function Tests:  Recent Labs Lab 10/18/15 1130  AST 40  ALT 35  ALKPHOS 75  BILITOT 0.8  PROT 6.0*  ALBUMIN 2.9*       Recent Labs Lab 10/18/15 1300  AMMONIA <9*   CBC:  Recent Labs Lab 10/18/15 1130 10/19/15 0704 10/20/15 0604 10/21/15 0648  WBC  24.7* 15.7* 16.9* 16.1*  HGB 12.5* 11.2* 11.0* 10.3*  HCT 39.6 36.2* 34.7* 32.8*  MCV 101.8* 102.0* 100.3* 100.6*  PLT 311 218 207 183   Cardiac Enzymes:  Recent Labs Lab 10/18/15 1130  TROPONINI 0.03       Recent Labs  08/22/15 1421 09/13/15 0650 09/29/15 1222  BNP 358.0* 595.0* 226.0*       Principal Problem:   Acute encephalopathy Active Problems:   Chronic atrial fibrillation (HCC)   Chronic diastolic heart failure (HCC)   Stage III chronic kidney disease   Diffuse large B cell lymphoma (HCC)   Recurrent right pleural effusion   Chronic respiratory failure with hypoxia (HCC)   Leukocytosis   Seroma   Cellulitis of groin   Altered mental status   Time coordinating discharge: 45 minutes  Signed:  Murray Hodgkins, MD [facility] 10/21/2015, 5:52 PM

## 2015-10-21 NOTE — Progress Notes (Signed)
Patient with orders to be discharge home. Discharge instruction, patient and daughter verbalized understanding. Patient stable. Patient left in private vehicle with daughter.

## 2015-10-21 NOTE — Care Management Note (Signed)
Case Management Note  Patient Details  Name: [name] MRN: [id number] Date of Birth: 08/15/1931  Subjective/Objective:                    Action/Plan:   Expected Discharge Date:  10/21/15               Expected Discharge Plan:  Home/Self Care  In-House Referral:  NA  Discharge planning Services  CM Consult  Post Acute Care Choice:  Home Health Choice offered to:  Patient, Adult Children  DME Arranged:    DME Agency:     HH Arranged:  RN [place] Agency:  [place]  Status of Service:  Completed, signed off  Medicare Important Message Given:  Yes-second notification given Date Medicare IM Given:    Medicare IM give by:    Date Additional Medicare IM Given:    Additional Medicare Important Message give by:     If discussed at [place] of Stay Meetings, dates discussed:    Additional Comments: Per Palliative care consult, pt wants to continue with chemo treatments at this time. Pt appears back at baseline. Pt would like to go home today. Arranged home health RN with [facility] (per pts choice). Romualdo Bolk of [facility] is aware and will collect the pts information from the chart. [place] services to start within 48 hours of discharge. No DME needs noted. Pt and pts nurse aware of discharge arrangements. Christinia Gully [facility], RN 10/21/2015, 3:44 PM

## 2015-10-23 LAB — CULTURE, BLOOD (ROUTINE X 2)
Culture: NO GROWTH
Culture: NO GROWTH

## 2015-10-29 DIAGNOSIS — Z9889 Other specified postprocedural states: Secondary | ICD-10-CM | POA: Insufficient documentation

## 2015-10-29 DIAGNOSIS — M549 Dorsalgia, unspecified: Secondary | ICD-10-CM | POA: Insufficient documentation

## 2015-11-01 ENCOUNTER — Encounter (HOSPITAL_COMMUNITY): Payer: Self-pay | Admitting: Oncology

## 2015-11-01 DIAGNOSIS — Z66 Do not resuscitate: Secondary | ICD-10-CM

## 2015-11-01 HISTORY — DX: Do not resuscitate: Z66

## 2015-11-01 NOTE — Assessment & Plan Note (Addendum)
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  He is S/P 1 cycle of R-mini-CHOP but issues with tolerance was appreciated.  His treatment was transitioned to palliative management with single-agent Rituxan.  Despite bone marrow involvement, he is not a candidate for IT therapy given high likelihood of intolerance.  Oncology history updated.  Labs today: CBC diff, CMET  Chart reviewed and hospitalization is noted.  Due to new onset seizures, [name] needs an LP to evaluate for CNS involvement of DLBCL.  Order set is placed and orders are signed. He is on Xarelto and this was discussed with Radiology, Dr. Ardeen Garland.  He recommended holding Xarelto 24 hours prior to LP and restarting later that day.  I have asked [name] to restart Xarelto the following day without loading dose.  DNR  Hypokalemia noted.  We will give him 40 mEq PO today and then he is asked to increase Kdur at home to 20 mEq daily.    Treatment parameters are met today for single-agent Rituxan.  It is unclear what the patient's goals are at this time.   Return 1-2 week following LP to review results and for further recommendations moving forward.

## 2015-11-01 NOTE — Progress Notes (Signed)
Tony Mustache, MD [place] [id number]  Diffuse large B-cell lymphoma of lymph nodes of multiple regions [facility]) - Plan: DNR (Do Not Resuscitate), CBC with Differential, Comprehensive metabolic panel, DG Fluoro Guide Lumbar Puncture, CSF cell count with differential, Glucose, CSF, Protein, CSF, Body fluid chemistry - LDH, CSF culture, Gram stain (required with CSF Culture), Cytology - non pap  DNR (do not resuscitate) - Plan: DNR (Do Not Resuscitate)  Hypokalemia - Plan: potassium chloride SA (K-DUR,KLOR-CON) CR tablet 40 mEq  CURRENT THERAPY: S/P 1 cycle of single-agent Rituxan on 10/12/2015 after intolerance to mini-R-CHOP  INTERVAL HISTORY: [name] 81 y.o. male returns for followup of Stage IV DLBCL, with bone marrow involvement.    Diffuse large B cell lymphoma ([place])   08/22/2015 - 08/25/2015 Hospital Admission Acute respiratory distress   08/22/2015 Imaging CTA chest- Right infrahilar lymph node versus central lung mass measuring 2.8 x 4.1 cm. Worsening mediastinal adenopathy and bilateral hilar adenopathy as described with the largest node over the subcarinal region measuring 2.7 cm by short axis...   08/24/2015 Pathology Results Diagnosis PLEURAL FLUID, RIGHT(SPECIMEN 1 OF 1 COLLECTED 08/24/15): ATYPICAL LYMPHOCYTES SUSPICIOUS FOR A LYMPHOPROLIFERATIVE PROCESS, SEE COMMENT. Tony Males MD   08/25/2015 Imaging CT abd/pelvis- Mild upper retroperitoneal (gastrohepatic ligament and right retrocrural) lymphadenopathy. Cirrhosis.   09/03/2015 PET scan Widespread metastatic adenopathy including the RIGHT cervical lymph nodes, LEFT axillary lymph nodes, mediastinal lymph, hilar lymph node, retroperitoneal lymph nodes and iliac lymph nodes and inguinal lymph nodes. Inguinal lymph nodes may be most....   09/11/2015 Pathology Results Diagnosis Lymph node, biopsy, right inguinal - DIFFUSE LARGE B CELL LYMPHOMA.   09/11/2015 Pathology Results Interpretation  Tissue-Flow Cytometry - MONOCLONAL B-CELL POPULATION IDENTIFIED. Diagnosis Comment: The findings are consistent with non-Hodgkin B-cell lymphoma. (BNS:ecj 09/16/2015)   09/13/2015 - 09/18/2015 Hospital Admission COPD exacerbation   09/18/2015 Bone Marrow Biopsy Bone Marrow, Aspirate,Biopsy, and Clot, left iliac crest: Despite limited findings, the features are worrisome for minimal involvement by B cell lymphoproliferative process, particularly given the previous history of large B cell lymphoma.    09/21/2015 - 10/11/2015 Chemotherapy Mini-R-CHOP   09/29/2015 - 10/02/2015 Hospital Admission SOB, anemia, dehydration, recurrent pleural effusion (right) Direct admit from [facility].  S/p 5 units of PRBCs, therapeutic thoracentesis on right relieveing 1.5 L of fluid, adjustment of pain medication.   10/12/2015 Treatment Plan Change Patient intolerant to R-mini-CHOP.  Trantition to palliative treatment with single-agent Rituxan.   10/12/2015 -  Chemotherapy Rituxan single-agent   10/18/2015 - 10/21/2015 Hospital Admission 1.   Acute encephalopathy 2.   New onset seizure disorder   10/19/2015 Imaging EEG- This recording is abnormal for the following reasons: Single epileptiform discharge involving the right anterior temporal area. This can correlates clinically with focal seizures.     I personally reviewed and went over laboratory results with the patient.  The results are noted within this dictation.  I personally reviewed and went over radiographic studies with the patient.  The results are noted within this dictation.    Chart reviewed and recent hospitalization noted.  Oncology history updated as a result.  His daughter reports that he has about 10 bad days before a single good day.  During these bad days, he sleeps most of the day.  When he is alert, he is unsure of where he is or the day of the week.  He notes that he loses track of the days because he lost his cell  phone until yesterday.  Now, he reports,  he is oriented to date and time.  His daughter is concerned about the patient's condition.  [name] is very nonspecific during conversation and confusing about his goals.  He is re-educated about current treatment being single-agent and therefore, not curable.  Goal of treatment is palliation.  He is advised that at any time we can stop treatment and focus on comfort intervention only.  He is not interested in this at this time.  He is a DNR.  Past Medical History  Diagnosis Date  . COPD (chronic obstructive pulmonary disease) (HCC)     Uses occasional nighttime O2  . Disseminated herpes zoster 2010  . GERD (gastroesophageal reflux disease)   . Asthma   . Pulmonary embolus ([place])     2003 and 2011  . Pulmonary nodules     Chest CT 09/11  . Mixed hyperlipidemia   . Chronic atrial fibrillation ([place])   . Lupus anticoagulant positive   . Essential hypertension, benign   . Cholelithiasis   . Rheumatoid arthritis(714.0)   . History of chicken pox 1941; 2011  . Anemia   . Chronic lower back pain   . Chronic diastolic heart failure ([place])   . Cirrhosis ([place])     Questionable, AFP normal Feb 01/2012, hx of ETOH use   . Headache(784.0)   . Cervical spondylosis without myelopathy   . Stage III chronic kidney disease   . Cellulitis of leg, left 01/03/2015  . Aortic stenosis, moderate 05/21/2015  . Dysrhythmia     chronic AFib  . Diffuse large B cell lymphoma ([place]) 08/22/2015  . DNR (do not resuscitate) 11/01/2015    has Essential hypertension, benign; PULMONARY EMBOLISM; Chronic atrial fibrillation ([place]); Rheumatoid arthritis ([place]); Lupus anticoagulant positive; GERD (gastroesophageal reflux disease); Cirrhosis ([place]); COPD (chronic obstructive pulmonary disease) ([place]); Long term current use of anticoagulant; Arthritis; Anemia; Adenomatous polyps; Chronic diastolic heart failure ([place]); Headache(784.0); Cervical spondylosis without myelopathy; Stiffness of joints, not elsewhere classified, multiple  sites; Posture imbalance; Cellulitis of leg, left; Acute kidney injury ([place]); Thrombocytopenia ([place]); Stage III chronic kidney disease; Acute on chronic diastolic CHF (congestive heart failure) ([place]); COPD exacerbation ([place]); Aortic stenosis, moderate; Acute diastolic CHF (congestive heart failure) ([place]); Acute respiratory distress ([place]); Diffuse large B cell lymphoma ([place]); Chronic a-fib (HCC); BPH (benign prostatic hyperplasia); Atrial fibrillation with RVR ([place]); HCAP (healthcare-associated pneumonia); COPD with acute exacerbation ([place]); Recurrent right pleural effusion; Chronic diastolic CHF (congestive heart failure) ([place]); Chronic respiratory failure ([place]); Malignant pleural effusion; Altered mental state; Acute encephalopathy; Chronic respiratory failure with hypoxia ([place]); Leukocytosis; Seroma; Cellulitis of groin; Altered mental status; Back pain; Status post thoracentesis; and DNR (do not resuscitate) on his problem list.     is allergic to cymbalta; procaine hcl; and bystolic.  Current Outpatient Prescriptions on File Prior to Visit  Medication Sig Dispense Refill  . albuterol (PROAIR HFA) 108 (90 BASE) MCG/ACT inhaler Inhale 2 puffs into the lungs every 6 (six) hours as needed. Shortness of Breath 1 Inhaler 3  . allopurinol (ZYLOPRIM) 300 MG tablet Take 1 tablet (300 mg total) by mouth daily. 30 tablet 3  . cetirizine (ZYRTEC) 10 MG tablet Take 10 mg by mouth daily.      Marland Kitchen DIGOX 125 MCG tablet TAKE ONE TABLET BY MOUTH DAILY 30 tablet 6  . diltiazem (CARDIZEM CD) 240 MG 24 hr capsule Take 1 capsule (240 mg total) by mouth daily. 30 capsule 1  . ferrous sulfate 325 (65  FE) MG tablet Take 325 mg by mouth daily.    . fluticasone (FLONASE) 50 MCG/ACT nasal spray Place 2 sprays into both nostrils daily.   0  . folic acid (FOLVITE) 1 MG tablet Take 1 mg by mouth daily.     . furosemide (LASIX) 40 MG tablet Take 1 tablet (40 mg total) by mouth daily. Take daily for prevention of fluid buildup. 30  tablet   . gabapentin (NEURONTIN) 300 MG capsule Take 2 capsules (600 mg total) by mouth 2 (two) times daily. 30 capsule 0  . ipratropium-albuterol (DUONEB) 0.5-2.5 (3) MG/3ML SOLN Take 3 mLs by nebulization every 6 (six) hours as needed. (Patient taking differently: Take 3 mLs by nebulization every 6 (six) hours as needed (shortness of breath). ) 360 mL 1  . ketoconazole (NIZORAL) 2 % shampoo Apply 1 application topically 3 (three) times a week.     . levETIRAcetam (KEPPRA) 500 MG tablet Take 1 tablet (500 mg total) by mouth 2 (two) times daily. 60 tablet 0  . lidocaine-prilocaine (EMLA) cream Apply a quarter size amount to port site 1 hour prior to chemo. Do not rub in. Cover with plastic wrap. 30 g 3  . Omega-3 Fatty Acids (FISH OIL) 1000 MG CAPS Take 2 capsules by mouth 2 (two) times daily.     Marland Kitchen omeprazole (PRILOSEC) 40 MG capsule Take 40 mg by mouth daily.    . ondansetron (ZOFRAN) 8 MG tablet Take 1 tablet every 8 hours as needed for nausea/vomiting. (Patient taking differently: Take 8 mg by mouth every 8 (eight) hours as needed for nausea or vomiting. ) 30 tablet 3  . Oxycodone HCl 10 MG TABS Take 1 tablet (10 mg total) by mouth every 6 (six) hours as needed. (Patient taking differently: Take 10 mg by mouth every 6 (six) hours as needed (pain). ) 100 tablet 0  . potassium chloride SA (K-DUR,KLOR-CON) 20 MEQ tablet Take 0.5 tablets (10 mEq total) by mouth daily. Starting 08/27/15.    Marland Kitchen predniSONE (DELTASONE) 20 MG tablet On Days 1-5 of chemo take 4.5 tablets (20m) daily. 30 tablet 4  . prochlorperazine (COMPAZINE) 10 MG tablet Take 1 tablet (10 mg total) by mouth every 6 (six) hours as needed (Nausea or vomiting). (Patient taking differently: Take 10 mg by mouth every 6 (six) hours as needed (Nausea or vomiting). nausea) 30 tablet 3  . Rivaroxaban (XARELTO STARTER PACK) 15 & 20 MG TBPK Take as directed on package: Start with one 163mtablet by mouth twice a day with food. On Day 22, switch to one  2047mablet once a day with food. 51 each 0  . Sennosides-Docusate Sodium (SENOKOT S PO) Take 1 tablet by mouth daily.     . tadalafil (CIALIS) 5 MG tablet Take 5 mg by mouth daily as needed for erectile dysfunction.     . tamsulosin (FLOMAX) 0.4 MG CAPS capsule Take 1 capsule (0.4 mg total) by mouth at bedtime. For prostate treatment. 30 capsule 3  . Vitamin D, Ergocalciferol, (DRISDOL) 50000 UNITS CAPS capsule Take 1 capsule by mouth once a week. friday  4   No current facility-administered medications on file prior to visit.    Past Surgical History  Procedure Laterality Date  . Polypectomy  02/02/2012    RMR: Multiple colonic polyps removed, flat, tubular adenomas/ Left-sided diverticulosis  . Posterior lumbar vertebrae excision  2003; 2009; 2011  . Myringotomy      "3 times; both ears"  . Cataract extraction  w/ intraocular lens  implant, bilateral    . Cholecystectomy  05/23/2012    Procedure: LAPAROSCOPIC CHOLECYSTECTOMY WITH INTRAOPERATIVE CHOLANGIOGRAM;  Surgeon: Stark Klein, MD;  Location: [facility];  Service: General;  Laterality: N/A;  . Esophagogastroduodenoscopy  02/02/12    HCW:CBJSEGBT size hiatal hernia; otherwise normal exam  . Colonoscopy  01/24/2013    DVV:OHYWVPX polyps/colonic diverticulosis  . Back surgery    . Portacath placement    . Lymph node biopsy    . Lymph node biopsy Right 09/11/2015    Procedure: RIGHT INGUINAL LYMPH NODE BIOPSY;  Surgeon: Aviva Signs, MD;  Location: [facility];  Service: General;  Laterality: Right;  . Portacath placement Left 09/11/2015    Procedure: INSERTION PORT-A-CATH;  Surgeon: Aviva Signs, MD;  Location: [facility];  Service: General;  Laterality: Left;    Denies any headaches, dizziness, double vision, fevers, chills, night sweats, nausea, vomiting, diarrhea, constipation, chest pain, heart palpitations, shortness of breath, blood in stool, black tarry stool, urinary pain, urinary burning, urinary frequency, hematuria.   PHYSICAL  EXAMINATION  ECOG PERFORMANCE STATUS: 3 - Symptomatic, >50% confined to bed  There were no vitals filed for this visit.  GENERAL:alert, no distress, comfortable, cooperative and in chemo-bed accompanied by his daughter. SKIN: skin color, texture, turgor are normal, no rashes or significant lesions HEAD: Normocephalic, No masses, lesions, tenderness or abnormalities EYES: normal, PERRLA, EOMI, Conjunctiva are pink and non-injected EARS: External ears normal OROPHARYNX:lips, buccal mucosa, and tongue normal and mucous membranes are moist  NECK: supple, trachea midline LYMPH:  not examined BREAST:not examined LUNGS: clear to auscultation  HEART: no murmurs, no gallops and irregularly irregular ABDOMEN:abdomen soft, non-tender and normal bowel sounds BACK: Back symmetric, no curvature., No CVA tenderness EXTREMITIES:less then 2 second capillary refill, no joint deformities, effusion, or inflammation, no edema, no skin discoloration, no clubbing, no cyanosis  NEURO: alert & oriented x 3 with fluent speech, no focal motor/sensory deficits    LABORATORY DATA: CBC    Component Value Date/Time   WBC 9.9 11/02/2015 0926   RBC 3.52* 11/02/2015 0926   HGB 11.0* 11/02/2015 0926   HCT 34.9* 11/02/2015 0926   HCT 34 01/11/2012 1007   PLT 164 11/02/2015 0926   MCV 99.1 11/02/2015 0926   MCV 92.0 01/11/2012 1007   MCH 31.3 11/02/2015 0926   MCHC 31.5 11/02/2015 0926   RDW 16.1* 11/02/2015 0926   LYMPHSABS 0.8 11/02/2015 0926   MONOABS 0.3 11/02/2015 0926   EOSABS 0.5 11/02/2015 0926   BASOSABS 0.0 11/02/2015 0926      Chemistry      Component Value Date/Time   NA 141 11/02/2015 0926   NA 140 01/11/2012 1005   K 3.2* 11/02/2015 0926   K 4.5 01/11/2012 1005   CL 98* 11/02/2015 0926   CO2 34* 11/02/2015 0926   BUN 14 11/02/2015 0926   BUN 12 01/11/2012 1005   CREATININE 1.14 11/02/2015 0926   CREATININE 1.17 01/11/2012 1005      Component Value Date/Time   CALCIUM 8.9  11/02/2015 0926   CALCIUM 9.2 01/11/2012 1005   ALKPHOS 69 11/02/2015 0926   AST 29 11/02/2015 0926   ALT 13* 11/02/2015 0926   BILITOT 1.3* 11/02/2015 0926        PENDING LABS:   RADIOGRAPHIC STUDIES:  Dg Chest 2 View  10/18/2015  CLINICAL DATA:  Disorientation for 2 weeks worse this morning, cursing at family members EXAM: CHEST  2 VIEW COMPARISON:  10/01/2015 FINDINGS: LEFT subclavian  power port with tip projecting over SVC. Upper normal heart size. Atherosclerotic calcification aorta. Pulmonary vascular congestion. Increased RIGHT pleural effusion and basilar atelectasis. Decreased lung volumes versus previous exam. Prominent interstitial markings, potentially accentuated by hypoinflation. No definite acute infiltrate, pleural effusion or pneumothorax. Bones demineralized. IMPRESSION: Increased RIGHT pleural effusion and basilar atelectasis. Chronic interstitial prominence. Electronically Signed   By: Lavonia Dana M.D.   On: 10/18/2015 12:54   Ct Head Wo Contrast  10/18/2015  CLINICAL DATA:  Altered mental status for 2 weeks, headaches. Known history of lupus and large cell lymphoma EXAM: CT HEAD WITHOUT CONTRAST TECHNIQUE: Contiguous axial images were obtained from the base of the skull through the vertex without intravenous contrast. COMPARISON:  09/13/2013 FINDINGS: The bony calvarium is intact. Diffuse atrophic changes are identified and stable. No findings to suggest acute hemorrhage, acute infarction or space-occupying mass lesion are noted. Changes of prior chronic ischemia are noted. IMPRESSION: No acute abnormality noted.  Chronic atrophic and ischemic changes. Electronically Signed   By: Inez Catalina M.D.   On: 10/18/2015 16:06   US Pelvis Limited  10/05/2015  CLINICAL DATA:  Right groin mass for 2 months.  Known lymphoma. EXAM: LIMITED ULTRASOUND OF PELVIS TECHNIQUE: Limited transabdominal ultrasound examination of the pelvis was performed. COMPARISON:  CT, 08/25/2015  FINDINGS: There is an irregular fluid collection in the right groin with no internal blood flow. Echogenic septations protrude into fluid collection, but there is no obvious mural mass. Collection somewhat difficult to measure given its irregular configuration, but measures approximately 6.6 x 6.4 x 5.2 cm. IMPRESSION: 1. Right groin mass appears to be an irregular fluid collection with no convincing internal blood flow. The etiology of this is unclear. It could potentially reflect a mass or enlarged lymph node with significant internal necrosis. Alternatively, it may reflect herniated peritoneum distended with ascites. There was no hernia on the prior CT, however. Recommend follow-up contrast-enhanced CT for more comprehensive assessment of this abnormality. Electronically Signed   By: Lajean Manes M.D.   On: 10/05/2015 14:17   Dg Knee Complete 4 Views Right  10/20/2015  CLINICAL DATA:  Right knee pain, initial encounter EXAM: RIGHT KNEE - COMPLETE 4+ VIEW COMPARISON:  None. FINDINGS: No acute fracture or dislocation is noted. Vascular calcifications are seen. 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 No joint effusion is noted. IMPRESSION: No acute abnormality seen. Electronically Signed   By: Inez Catalina M.D.   On: 10/20/2015 16:32     PATHOLOGY:    ASSESSMENT AND PLAN:  Diffuse large B cell lymphoma ([place]) Stage IV DLBCL with positive involvement of bone marrow.  He is S/P 1 cycle of R-mini-CHOP but issues with tolerance was appreciated.  His treatment was transitioned to palliative management with single-agent Rituxan.  Despite bone marrow involvement, he is not a candidate for IT therapy given high likelihood of intolerance.  Oncology history updated.  Labs today: CBC diff, CMET  Chart reviewed and hospitalization is noted.  Due to new onset seizures, Cohan needs an LP to evaluate for CNS involvement of DLBCL.  Order set is placed and orders are signed. He is on Xarelto and  this was discussed with Radiology, Dr. Ardeen Garland.  He recommended holding Xarelto 24 hours prior to LP and restarting later that day.  I have asked Cedarius to restart Xarelto the following day without loading dose.  DNR  Hypokalemia noted.  We will give him 40 mEq PO today and then he is asked to  increase Kdur at home to 20 mEq daily.    Treatment parameters are met today for single-agent Rituxan.  It is unclear what the patient's goals are at this time.   Return 1-2 week following LP to review results and for further recommendations moving forward.    THERAPY PLAN:  Palliative treatment only.  LP will be done later this week secondary to new onset seizure and mental status change.  If positive for disease, we will recommend Hospice only.  He is not a candidate, given his current performance status, for IT chemotherapy.  All questions were answered. The patient knows to call the clinic with any problems, questions or concerns. We can certainly see the patient much sooner if necessary.  Patient and plan discussed with Dr. Ancil Linsey and she is in agreement with the aforementioned.   This note is electronically signed by: Doy Mince 11/02/2015 4:36 PM

## 2015-11-02 ENCOUNTER — Encounter (HOSPITAL_COMMUNITY): Payer: Self-pay

## 2015-11-02 ENCOUNTER — Encounter (HOSPITAL_BASED_OUTPATIENT_CLINIC_OR_DEPARTMENT_OTHER): Payer: Medicare Other

## 2015-11-02 ENCOUNTER — Encounter (HOSPITAL_COMMUNITY): Payer: Medicare Other | Attending: Oncology | Admitting: Oncology

## 2015-11-02 ENCOUNTER — Inpatient Hospital Stay (HOSPITAL_COMMUNITY): Payer: Medicare Other

## 2015-11-02 VITALS — BP 93/51 | HR 66 | Temp 97.4°F | Resp 18 | Wt 174.0 lb

## 2015-11-02 DIAGNOSIS — E876 Hypokalemia: Secondary | ICD-10-CM

## 2015-11-02 DIAGNOSIS — R4182 Altered mental status, unspecified: Secondary | ICD-10-CM | POA: Diagnosis not present

## 2015-11-02 DIAGNOSIS — R790 Abnormal level of blood mineral: Secondary | ICD-10-CM | POA: Insufficient documentation

## 2015-11-02 DIAGNOSIS — C8330 Diffuse large B-cell lymphoma, unspecified site: Secondary | ICD-10-CM | POA: Diagnosis not present

## 2015-11-02 DIAGNOSIS — R918 Other nonspecific abnormal finding of lung field: Secondary | ICD-10-CM | POA: Insufficient documentation

## 2015-11-02 DIAGNOSIS — Z5112 Encounter for antineoplastic immunotherapy: Secondary | ICD-10-CM | POA: Diagnosis not present

## 2015-11-02 DIAGNOSIS — C8338 Diffuse large B-cell lymphoma, lymph nodes of multiple sites: Secondary | ICD-10-CM

## 2015-11-02 DIAGNOSIS — Z66 Do not resuscitate: Secondary | ICD-10-CM

## 2015-11-02 LAB — COMPREHENSIVE METABOLIC PANEL
ALK PHOS: 69 U/L (ref 38–126)
ALT: 13 U/L — ABNORMAL LOW (ref 17–63)
ANION GAP: 9 (ref 5–15)
AST: 29 U/L (ref 15–41)
Albumin: 2.6 g/dL — ABNORMAL LOW (ref 3.5–5.0)
BILIRUBIN TOTAL: 1.3 mg/dL — AB (ref 0.3–1.2)
BUN: 14 mg/dL (ref 6–20)
CALCIUM: 8.9 mg/dL (ref 8.9–10.3)
CO2: 34 mmol/L — ABNORMAL HIGH (ref 22–32)
Chloride: 98 mmol/L — ABNORMAL LOW (ref 101–111)
Creatinine, Ser: 1.14 mg/dL (ref 0.61–1.24)
GFR calc Af Amer: >60 mL/min (ref >60)
GFR, EST NON AFRICAN AMERICAN: 58 mL/min — AB (ref >60)
Glucose, Bld: 128 mg/dL — ABNORMAL HIGH (ref 65–99)
POTASSIUM: 3.2 mmol/L — AB (ref 3.5–5.1)
Sodium: 141 mmol/L (ref 135–145)
TOTAL PROTEIN: 6.1 g/dL — AB (ref 6.5–8.1)

## 2015-11-02 LAB — CBC WITH DIFFERENTIAL/PLATELET
Basophils Absolute: 0 10*3/uL (ref 0.0–0.1)
Basophils Relative: 0 %
Eosinophils Absolute: 0.5 10*3/uL (ref 0.0–0.7)
Eosinophils Relative: 5 %
HEMATOCRIT: 34.9 % — AB (ref 39.0–52.0)
Hemoglobin: 11 g/dL — ABNORMAL LOW (ref 13.0–17.0)
LYMPHS PCT: 8 %
Lymphs Abs: 0.8 10*3/uL (ref 0.7–4.0)
MCH: 31.3 pg (ref 26.0–34.0)
MCHC: 31.5 g/dL (ref 30.0–36.0)
MCV: 99.1 fL (ref 78.0–100.0)
MONO ABS: 0.3 10*3/uL (ref 0.1–1.0)
MONOS PCT: 3 %
NEUTROS ABS: 8.3 10*3/uL — AB (ref 1.7–7.7)
Neutrophils Relative %: 84 %
Platelets: 164 10*3/uL (ref 150–400)
RBC: 3.52 MIL/uL — ABNORMAL LOW (ref 4.22–5.81)
RDW: 16.1 % — AB (ref 11.5–15.5)
WBC: 9.9 10*3/uL (ref 4.0–10.5)

## 2015-11-02 MED ORDER — DIPHENHYDRAMINE HCL 25 MG PO CAPS
50.0000 mg | ORAL_CAPSULE | Freq: Once | ORAL | Status: AC
  Administered 2015-11-02: 50 mg via ORAL

## 2015-11-02 MED ORDER — HEPARIN SOD (PORK) LOCK FLUSH 100 UNIT/ML IV SOLN
500.0000 [IU] | Freq: Once | INTRAVENOUS | Status: AC | PRN
  Administered 2015-11-02: 500 [IU]

## 2015-11-02 MED ORDER — POTASSIUM CHLORIDE CRYS ER 20 MEQ PO TBCR
40.0000 meq | EXTENDED_RELEASE_TABLET | Freq: Once | ORAL | Status: AC
  Administered 2015-11-02: 40 meq via ORAL
  Filled 2015-11-02: qty 2

## 2015-11-02 MED ORDER — DIPHENHYDRAMINE HCL 25 MG PO CAPS
ORAL_CAPSULE | ORAL | Status: AC
  Filled 2015-11-02: qty 2

## 2015-11-02 MED ORDER — SODIUM CHLORIDE 0.9 % IV SOLN
375.0000 mg/m2 | Freq: Once | INTRAVENOUS | Status: AC
  Administered 2015-11-02: 800 mg via INTRAVENOUS
  Filled 2015-11-02: qty 70

## 2015-11-02 MED ORDER — ACETAMINOPHEN 325 MG PO TABS
ORAL_TABLET | ORAL | Status: AC
  Filled 2015-11-02: qty 2

## 2015-11-02 MED ORDER — SODIUM CHLORIDE 0.9 % IV SOLN
Freq: Once | INTRAVENOUS | Status: AC
  Administered 2015-11-02: 11:00:00 via INTRAVENOUS

## 2015-11-02 MED ORDER — ACETAMINOPHEN 325 MG PO TABS
650.0000 mg | ORAL_TABLET | Freq: Once | ORAL | Status: AC
  Administered 2015-11-02: 650 mg via ORAL

## 2015-11-02 MED ORDER — SODIUM CHLORIDE 0.9 % IJ SOLN
10.0000 mL | INTRAMUSCULAR | Status: DC | PRN
  Administered 2015-11-02: 10 mL
  Filled 2015-11-02: qty 10

## 2015-11-02 MED ORDER — HEPARIN SOD (PORK) LOCK FLUSH 100 UNIT/ML IV SOLN
INTRAVENOUS | Status: AC
  Filled 2015-11-02: qty 5

## 2015-11-02 NOTE — Patient Instructions (Signed)
[place] at [facility] Discharge Instructions  RECOMMENDATIONS MADE BY THE CONSULTANT AND ANY TEST RESULTS WILL BE SENT TO YOUR REFERRING PHYSICIAN. Exam and discussion by Robynn Pane, PA-C Lumbar Puncture this week - to be done on Thursday 11/10 at 9:30am - arrive 15 minutes early Xarelto - do not take it for the day before the Lumbar Puncture and restart it 24 hours after the procedure - 20 mg daily.(No xarelto on Wednesday 11/9. Restart pm 11/11.) Potassium - will give you 40 meq today and want you to increase your dosage at home to 20 meq daily.  Return 1 week after procedure.   Thank you for choosing [place] at [facility] to provide your oncology and hematology care.  To afford each patient quality time with our provider, please arrive at least 15 minutes before your scheduled appointment time.    You need to re-schedule your appointment should you arrive 10 or more minutes late.  We strive to give you quality time with our providers, and arriving late affects you and other patients whose appointments are after yours.  Also, if you no show three or more times for appointments you may be dismissed from the clinic at the providers discretion.     Again, thank you for choosing [facility].  Our hope is that these requests will decrease the amount of time that you wait before being seen by our physicians.       _____________________________________________________________  Should you have questions after your visit to [facility], please contact our office at [phone] between the hours of 8:30 a.m. and 4:30 p.m.  Voicemails left after 4:30 p.m. will not be returned until the following business day.  For prescription refill requests, have your pharmacy contact our office.

## 2015-11-02 NOTE — Patient Instructions (Signed)
[facility] Discharge Instructions for Patients Receiving Chemotherapy  Today you received the following chemotherapy agents:  Rituxan  If you develop nausea and vomiting, or diarrhea that is not controlled by your medication, call the clinic.  The clinic phone number is [phone]. Office hours are Monday-Friday 8:30am-5:00pm.  BELOW ARE SYMPTOMS THAT SHOULD BE REPORTED IMMEDIATELY:  *FEVER GREATER THAN 101.0 F  *CHILLS WITH OR WITHOUT FEVER  NAUSEA AND VOMITING THAT IS NOT CONTROLLED WITH YOUR NAUSEA MEDICATION  *UNUSUAL SHORTNESS OF BREATH  *UNUSUAL BRUISING OR BLEEDING  TENDERNESS IN MOUTH AND THROAT WITH OR WITHOUT PRESENCE OF ULCERS  *URINARY PROBLEMS  *BOWEL PROBLEMS  UNUSUAL RASH Items with * indicate a potential emergency and should be followed up as soon as possible. If you have an emergency after office hours please contact your primary care physician or go to the nearest emergency department.  Please call the clinic during office hours if you have any questions or concerns.   You may also contact the Patient Navigator at [phone] should you have any questions or need assistance in obtaining follow up care. _____________________________________________________________________ Have you asked about our STAR program?    STAR stands for Survivorship Training and Rehabilitation, and this is a nationally recognized cancer care program that focuses on survivorship and rehabilitation.  Cancer and cancer treatments may cause problems, such as, pain, making you feel tired and keeping you from doing the things that you need or want to do. Cancer rehabilitation can help. Our goal is to reduce these troubling effects and help you have the best quality of life possible.  You may receive a survey from a nurse that asks questions about your current state of health.  Based on the survey results, all eligible patients will be referred to the STAR program for an  evaluation so we can better serve you! A frequently asked questions sheet is available upon request.           

## 2015-11-02 NOTE — Progress Notes (Signed)
Rituxan only today per MD and PA.  1330:  Tolerated Rituxan infusion w/o adverse reaction.  VSS.  Discharged via wheelchair in c/o family member for transport home.

## 2015-11-03 ENCOUNTER — Inpatient Hospital Stay (HOSPITAL_COMMUNITY): Payer: Medicare Other

## 2015-11-05 ENCOUNTER — Inpatient Hospital Stay (HOSPITAL_COMMUNITY): Admission: RE | Admit: 2015-11-05 | Payer: Medicare Other | Source: Ambulatory Visit

## 2015-11-10 ENCOUNTER — Ambulatory Visit (HOSPITAL_COMMUNITY): Payer: Medicare Other | Admitting: Oncology

## 2015-11-13 ENCOUNTER — Inpatient Hospital Stay (HOSPITAL_COMMUNITY): Admission: RE | Admit: 2015-11-13 | Payer: Medicare Other | Source: Ambulatory Visit

## 2015-11-13 ENCOUNTER — Other Ambulatory Visit (HOSPITAL_COMMUNITY): Payer: Self-pay | Admitting: Oncology

## 2015-11-13 DIAGNOSIS — C8330 Diffuse large B-cell lymphoma, unspecified site: Secondary | ICD-10-CM

## 2015-11-13 MED ORDER — OXYCODONE HCL 10 MG PO TABS: 10.0000 mg | ORAL_TABLET | Freq: Four times a day (QID) | ORAL | Status: DC | PRN

## 2015-11-23 ENCOUNTER — Inpatient Hospital Stay (HOSPITAL_COMMUNITY): Payer: Medicare Other

## 2015-11-23 ENCOUNTER — Ambulatory Visit (HOSPITAL_COMMUNITY): Payer: Medicare Other | Admitting: Hematology & Oncology

## 2015-11-24 ENCOUNTER — Inpatient Hospital Stay (HOSPITAL_COMMUNITY): Payer: Medicare Other

## 2015-11-24 ENCOUNTER — Ambulatory Visit (HOSPITAL_COMMUNITY): Payer: Medicare Other | Admitting: Oncology

## 2015-11-28 ENCOUNTER — Inpatient Hospital Stay (HOSPITAL_COMMUNITY)
Admission: EM | Admit: 2015-11-28 | Discharge: 2015-12-03 | DRG: 378 | Disposition: A | Discharge status: Home Health | Payer: Medicare Other | Attending: Internal Medicine | Admitting: Internal Medicine

## 2015-11-28 ENCOUNTER — Emergency Department (HOSPITAL_COMMUNITY): Payer: Medicare Other

## 2015-11-28 ENCOUNTER — Encounter (HOSPITAL_COMMUNITY): Payer: Self-pay | Admitting: *Deleted

## 2015-11-28 DIAGNOSIS — Z87891 Personal history of nicotine dependence: Secondary | ICD-10-CM

## 2015-11-28 DIAGNOSIS — T451X5A Adverse effect of antineoplastic and immunosuppressive drugs, initial encounter: Secondary | ICD-10-CM | POA: Diagnosis present

## 2015-11-28 DIAGNOSIS — C8338 Diffuse large B-cell lymphoma, lymph nodes of multiple sites: Secondary | ICD-10-CM | POA: Diagnosis present

## 2015-11-28 DIAGNOSIS — D6959 Other secondary thrombocytopenia: Secondary | ICD-10-CM | POA: Diagnosis present

## 2015-11-28 DIAGNOSIS — K648 Other hemorrhoids: Secondary | ICD-10-CM | POA: Diagnosis present

## 2015-11-28 DIAGNOSIS — I248 Other forms of acute ischemic heart disease: Secondary | ICD-10-CM | POA: Diagnosis present

## 2015-11-28 DIAGNOSIS — J9611 Chronic respiratory failure with hypoxia: Secondary | ICD-10-CM | POA: Diagnosis present

## 2015-11-28 DIAGNOSIS — E860 Dehydration: Secondary | ICD-10-CM | POA: Diagnosis present

## 2015-11-28 DIAGNOSIS — Z86711 Personal history of pulmonary embolism: Secondary | ICD-10-CM | POA: Diagnosis not present

## 2015-11-28 DIAGNOSIS — N179 Acute kidney failure, unspecified: Secondary | ICD-10-CM | POA: Diagnosis present

## 2015-11-28 DIAGNOSIS — C8330 Diffuse large B-cell lymphoma, unspecified site: Secondary | ICD-10-CM | POA: Diagnosis present

## 2015-11-28 DIAGNOSIS — K922 Gastrointestinal hemorrhage, unspecified: Secondary | ICD-10-CM | POA: Diagnosis present

## 2015-11-28 DIAGNOSIS — K626 Ulcer of anus and rectum: Secondary | ICD-10-CM | POA: Diagnosis present

## 2015-11-28 DIAGNOSIS — K7460 Unspecified cirrhosis of liver: Secondary | ICD-10-CM | POA: Diagnosis present

## 2015-11-28 DIAGNOSIS — D649 Anemia, unspecified: Secondary | ICD-10-CM | POA: Diagnosis not present

## 2015-11-28 DIAGNOSIS — R7989 Other specified abnormal findings of blood chemistry: Secondary | ICD-10-CM | POA: Diagnosis present

## 2015-11-28 DIAGNOSIS — Z833 Family history of diabetes mellitus: Secondary | ICD-10-CM | POA: Diagnosis not present

## 2015-11-28 DIAGNOSIS — G40909 Epilepsy, unspecified, not intractable, without status epilepticus: Secondary | ICD-10-CM | POA: Diagnosis present

## 2015-11-28 DIAGNOSIS — T501X5A Adverse effect of loop [high-ceiling] diuretics, initial encounter: Secondary | ICD-10-CM | POA: Diagnosis present

## 2015-11-28 DIAGNOSIS — M069 Rheumatoid arthritis, unspecified: Secondary | ICD-10-CM | POA: Diagnosis present

## 2015-11-28 DIAGNOSIS — K449 Diaphragmatic hernia without obstruction or gangrene: Secondary | ICD-10-CM | POA: Diagnosis present

## 2015-11-28 DIAGNOSIS — J449 Chronic obstructive pulmonary disease, unspecified: Secondary | ICD-10-CM | POA: Diagnosis present

## 2015-11-28 DIAGNOSIS — C7952 Secondary malignant neoplasm of bone marrow: Secondary | ICD-10-CM | POA: Diagnosis present

## 2015-11-28 DIAGNOSIS — I5032 Chronic diastolic (congestive) heart failure: Secondary | ICD-10-CM | POA: Diagnosis present

## 2015-11-28 DIAGNOSIS — Z7901 Long term (current) use of anticoagulants: Secondary | ICD-10-CM | POA: Diagnosis not present

## 2015-11-28 DIAGNOSIS — M545 Low back pain: Secondary | ICD-10-CM | POA: Diagnosis present

## 2015-11-28 DIAGNOSIS — D696 Thrombocytopenia, unspecified: Secondary | ICD-10-CM | POA: Diagnosis present

## 2015-11-28 DIAGNOSIS — Z9981 Dependence on supplemental oxygen: Secondary | ICD-10-CM

## 2015-11-28 DIAGNOSIS — Z86010 Personal history of colonic polyps: Secondary | ICD-10-CM | POA: Diagnosis not present

## 2015-11-28 DIAGNOSIS — K5730 Diverticulosis of large intestine without perforation or abscess without bleeding: Secondary | ICD-10-CM | POA: Diagnosis present

## 2015-11-28 DIAGNOSIS — K219 Gastro-esophageal reflux disease without esophagitis: Secondary | ICD-10-CM | POA: Diagnosis present

## 2015-11-28 DIAGNOSIS — I951 Orthostatic hypotension: Secondary | ICD-10-CM | POA: Diagnosis present

## 2015-11-28 DIAGNOSIS — I781 Nevus, non-neoplastic: Secondary | ICD-10-CM | POA: Diagnosis not present

## 2015-11-28 DIAGNOSIS — E782 Mixed hyperlipidemia: Secondary | ICD-10-CM | POA: Diagnosis present

## 2015-11-28 DIAGNOSIS — K31819 Angiodysplasia of stomach and duodenum without bleeding: Secondary | ICD-10-CM | POA: Diagnosis not present

## 2015-11-28 DIAGNOSIS — E861 Hypovolemia: Secondary | ICD-10-CM | POA: Diagnosis present

## 2015-11-28 DIAGNOSIS — D62 Acute posthemorrhagic anemia: Secondary | ICD-10-CM | POA: Diagnosis present

## 2015-11-28 DIAGNOSIS — R1013 Epigastric pain: Secondary | ICD-10-CM | POA: Diagnosis present

## 2015-11-28 DIAGNOSIS — I482 Chronic atrial fibrillation, unspecified: Secondary | ICD-10-CM | POA: Diagnosis present

## 2015-11-28 DIAGNOSIS — I130 Hypertensive heart and chronic kidney disease with heart failure and stage 1 through stage 4 chronic kidney disease, or unspecified chronic kidney disease: Secondary | ICD-10-CM | POA: Diagnosis present

## 2015-11-28 DIAGNOSIS — R55 Syncope and collapse: Secondary | ICD-10-CM | POA: Diagnosis present

## 2015-11-28 DIAGNOSIS — Z66 Do not resuscitate: Secondary | ICD-10-CM | POA: Diagnosis present

## 2015-11-28 DIAGNOSIS — K921 Melena: Principal | ICD-10-CM | POA: Diagnosis present

## 2015-11-28 DIAGNOSIS — G8929 Other chronic pain: Secondary | ICD-10-CM | POA: Diagnosis present

## 2015-11-28 DIAGNOSIS — R0600 Dyspnea, unspecified: Secondary | ICD-10-CM

## 2015-11-28 DIAGNOSIS — N183 Chronic kidney disease, stage 3 (moderate): Secondary | ICD-10-CM | POA: Diagnosis present

## 2015-11-28 DIAGNOSIS — R778 Other specified abnormalities of plasma proteins: Secondary | ICD-10-CM | POA: Diagnosis present

## 2015-11-28 DIAGNOSIS — K3189 Other diseases of stomach and duodenum: Secondary | ICD-10-CM | POA: Diagnosis not present

## 2015-11-28 HISTORY — DX: Nevus, non-neoplastic: I78.1

## 2015-11-28 HISTORY — DX: Ulcer of anus and rectum: K62.6

## 2015-11-28 HISTORY — DX: Epilepsy, unspecified, not intractable, without status epilepticus: G40.909

## 2015-11-28 LAB — DIGOXIN LEVEL: DIGOXIN LVL: 0.7 ng/mL — AB (ref 0.8–2.0)

## 2015-11-28 LAB — CBC WITH DIFFERENTIAL/PLATELET
Basophils Absolute: 0 10*3/uL (ref 0.0–0.1)
Basophils Relative: 0 %
Eosinophils Absolute: 0.3 10*3/uL (ref 0.0–0.7)
Eosinophils Relative: 4 %
HCT: 30.7 % — ABNORMAL LOW (ref 39.0–52.0)
Hemoglobin: 10.4 g/dL — ABNORMAL LOW (ref 13.0–17.0)
Lymphocytes Relative: 17 %
Lymphs Abs: 1.1 10*3/uL (ref 0.7–4.0)
MCH: 31.7 pg (ref 26.0–34.0)
MCHC: 33.9 g/dL (ref 30.0–36.0)
MCV: 93.6 fL (ref 78.0–100.0)
Monocytes Absolute: 0.7 10*3/uL (ref 0.1–1.0)
Monocytes Relative: 11 %
Neutro Abs: 4.6 10*3/uL (ref 1.7–7.7)
Neutrophils Relative %: 68 %
Platelets: 113 10*3/uL — ABNORMAL LOW (ref 150–400)
RBC: 3.28 MIL/uL — ABNORMAL LOW (ref 4.22–5.81)
RDW: 15.1 % (ref 11.5–15.5)
WBC: 6.8 10*3/uL (ref 4.0–10.5)

## 2015-11-28 LAB — COMPREHENSIVE METABOLIC PANEL
ALT: 11 U/L — ABNORMAL LOW (ref 17–63)
AST: 21 U/L (ref 15–41)
Albumin: 3.1 g/dL — ABNORMAL LOW (ref 3.5–5.0)
Alkaline Phosphatase: 58 U/L (ref 38–126)
Anion gap: 8 (ref 5–15)
BUN: 33 mg/dL — ABNORMAL HIGH (ref 6–20)
CO2: 33 mmol/L — ABNORMAL HIGH (ref 22–32)
Calcium: 9.2 mg/dL (ref 8.9–10.3)
Chloride: 97 mmol/L — ABNORMAL LOW (ref 101–111)
Creatinine, Ser: 1.37 mg/dL — ABNORMAL HIGH (ref 0.61–1.24)
GFR calc Af Amer: 54 mL/min — ABNORMAL LOW (ref >60)
GFR calc non Af Amer: 46 mL/min — ABNORMAL LOW (ref >60)
Glucose, Bld: 116 mg/dL — ABNORMAL HIGH (ref 65–99)
Potassium: 3.6 mmol/L (ref 3.5–5.1)
Sodium: 138 mmol/L (ref 135–145)
Total Bilirubin: 1 mg/dL (ref 0.3–1.2)
Total Protein: 6.3 g/dL — ABNORMAL LOW (ref 6.5–8.1)

## 2015-11-28 LAB — TROPONIN I
Troponin I: 0.08 ng/mL — ABNORMAL HIGH (ref <0.031)
Troponin I: 0.08 ng/mL — ABNORMAL HIGH (ref <0.031)

## 2015-11-28 LAB — BRAIN NATRIURETIC PEPTIDE: B Natriuretic Peptide: 144 pg/mL — ABNORMAL HIGH (ref 0.0–100.0)

## 2015-11-28 LAB — LIPASE, BLOOD: Lipase: 20 U/L (ref 11–51)

## 2015-11-28 LAB — POC OCCULT BLOOD, ED: FECAL OCCULT BLD: POSITIVE — AB

## 2015-11-28 MED ORDER — IPRATROPIUM-ALBUTEROL 0.5-2.5 (3) MG/3ML IN SOLN: 3.0000 mL | Freq: Four times a day (QID) | RESPIRATORY_TRACT | Status: DC | PRN

## 2015-11-28 MED ORDER — FOLIC ACID 1 MG PO TABS
1.0000 mg | ORAL_TABLET | Freq: Every day | ORAL | Status: DC
  Administered 2015-11-28 – 2015-12-03 (×6): 1 mg via ORAL
  Filled 2015-11-28 (×6): qty 1

## 2015-11-28 MED ORDER — SENNA 8.6 MG PO TABS
2.0000 | ORAL_TABLET | Freq: Two times a day (BID) | ORAL | Status: DC
  Administered 2015-11-28 – 2015-12-03 (×8): 17.2 mg via ORAL
  Filled 2015-11-28 (×14): qty 2

## 2015-11-28 MED ORDER — GABAPENTIN 300 MG PO CAPS
300.0000 mg | ORAL_CAPSULE | Freq: Two times a day (BID) | ORAL | Status: DC
  Administered 2015-11-28 – 2015-12-03 (×10): 300 mg via ORAL
  Filled 2015-11-28: qty 1
  Filled 2015-11-28 (×2): qty 3
  Filled 2015-11-28 (×5): qty 1
  Filled 2015-11-28: qty 3
  Filled 2015-11-28: qty 1

## 2015-11-28 MED ORDER — DILTIAZEM HCL 30 MG PO TABS
30.0000 mg | ORAL_TABLET | Freq: Four times a day (QID) | ORAL | Status: DC
  Administered 2015-11-28 – 2015-11-29 (×2): 30 mg via ORAL
  Filled 2015-11-28 (×4): qty 1

## 2015-11-28 MED ORDER — ONDANSETRON HCL 4 MG PO TABS: 4.0000 mg | ORAL_TABLET | Freq: Four times a day (QID) | ORAL | Status: DC | PRN

## 2015-11-28 MED ORDER — DIPHENHYDRAMINE HCL 25 MG PO TABS
12.5000 mg | ORAL_TABLET | Freq: Three times a day (TID) | ORAL | Status: DC | PRN
  Filled 2015-11-28: qty 0.5

## 2015-11-28 MED ORDER — CETYLPYRIDINIUM CHLORIDE 0.05 % MT LIQD
7.0000 mL | Freq: Two times a day (BID) | OROMUCOSAL | Status: DC
  Administered 2015-11-29 – 2015-12-02 (×7): 7 mL via OROMUCOSAL

## 2015-11-28 MED ORDER — ONDANSETRON HCL 4 MG/2ML IJ SOLN: 4.0000 mg | Freq: Four times a day (QID) | INTRAMUSCULAR | Status: DC | PRN

## 2015-11-28 MED ORDER — FLUTICASONE PROPIONATE 50 MCG/ACT NA SUSP
2.0000 | Freq: Every day | NASAL | Status: DC
  Administered 2015-11-28 – 2015-12-03 (×6): 2 via NASAL
  Filled 2015-11-28 (×3): qty 16

## 2015-11-28 MED ORDER — PANTOPRAZOLE SODIUM 40 MG IV SOLR
40.0000 mg | Freq: Two times a day (BID) | INTRAVENOUS | Status: DC
  Administered 2015-11-29 – 2015-12-01 (×6): 40 mg via INTRAVENOUS
  Filled 2015-11-28 (×7): qty 40

## 2015-11-28 MED ORDER — GUAIFENESIN-DM 100-10 MG/5ML PO SYRP: 5.0000 mL | ORAL_SOLUTION | ORAL | Status: DC | PRN

## 2015-11-28 MED ORDER — TAMSULOSIN HCL 0.4 MG PO CAPS
0.4000 mg | ORAL_CAPSULE | Freq: Every day | ORAL | Status: DC
  Administered 2015-11-28 – 2015-12-02 (×5): 0.4 mg via ORAL
  Filled 2015-11-28 (×5): qty 1

## 2015-11-28 MED ORDER — PANTOPRAZOLE SODIUM 40 MG IV SOLR
80.0000 mg | Freq: Once | INTRAVENOUS | Status: AC
  Administered 2015-11-28: 80 mg via INTRAVENOUS
  Filled 2015-11-28: qty 80

## 2015-11-28 MED ORDER — SODIUM CHLORIDE 0.9 % IV BOLUS (SEPSIS)
500.0000 mL | Freq: Once | INTRAVENOUS | Status: AC
  Administered 2015-11-28: 500 mL via INTRAVENOUS

## 2015-11-28 MED ORDER — POTASSIUM CHLORIDE IN NACL 20-0.9 MEQ/L-% IV SOLN
INTRAVENOUS | Status: DC
  Administered 2015-11-28 – 2015-11-30 (×2): via INTRAVENOUS

## 2015-11-28 MED ORDER — ACETAMINOPHEN 325 MG PO TABS: 650.0000 mg | ORAL_TABLET | Freq: Four times a day (QID) | ORAL | Status: DC | PRN

## 2015-11-28 MED ORDER — ALUM & MAG HYDROXIDE-SIMETH 200-200-20 MG/5ML PO SUSP: 30.0000 mL | Freq: Four times a day (QID) | ORAL | Status: DC | PRN

## 2015-11-28 MED ORDER — DIGOXIN 125 MCG PO TABS
125.0000 ug | ORAL_TABLET | Freq: Every day | ORAL | Status: DC
  Administered 2015-11-28 – 2015-12-03 (×6): 125 ug via ORAL
  Filled 2015-11-28 (×9): qty 1

## 2015-11-28 MED ORDER — MORPHINE SULFATE (PF) 2 MG/ML IV SOLN
2.0000 mg | INTRAVENOUS | Status: DC | PRN
  Administered 2015-11-28 – 2015-11-30 (×6): 2 mg via INTRAVENOUS
  Filled 2015-11-28 (×7): qty 1

## 2015-11-28 MED ORDER — ALLOPURINOL 300 MG PO TABS
300.0000 mg | ORAL_TABLET | Freq: Every day | ORAL | Status: DC
  Administered 2015-11-28 – 2015-12-03 (×6): 300 mg via ORAL
  Filled 2015-11-28 (×9): qty 1

## 2015-11-28 MED ORDER — ACETAMINOPHEN 650 MG RE SUPP: 650.0000 mg | Freq: Four times a day (QID) | RECTAL | Status: DC | PRN

## 2015-11-28 MED ORDER — LEVETIRACETAM 500 MG PO TABS
500.0000 mg | ORAL_TABLET | Freq: Two times a day (BID) | ORAL | Status: DC
  Administered 2015-11-28 – 2015-12-03 (×10): 500 mg via ORAL
  Filled 2015-11-28 (×10): qty 1

## 2015-11-28 NOTE — ED Notes (Signed)
Report attempted.  Nurse to call back.

## 2015-11-28 NOTE — ED Notes (Addendum)
Pt assisted to [facility] x 2 in attempts to have BM with no success. HR up to 150 with increased SOB when getting pt in and out of bed from [facility]. After resting in bed, VSS. HR remains irreg due to afib currently 99

## 2015-11-28 NOTE — H&P (Addendum)
[facility] History and Physical  [name] [id number] DOB: July 31, 1931 DOA: 11/28/2015  Referring physician: ED physician, Dr. Wilson Singer PCP: [name], MD   Chief Complaint: Patient passed out at home; epigastric abdominal pain; black stools.  HPI: [name] is a 81 y.o. male with a history of stage IV metastatic lymphoma on palliative chemotherapy, chronic atrial fibrillation on anticoagulation with Xarelto, chronic diastolic heart failure, prior pulmonary embolus, seizure disorder, and oxygen dependent COPD. He presents from home after apparently passing out. The history is being provided by the patient and his daughter [name]. Accordingly, this morning, the patient got up to go use the bathroom. [name] heard the patient fall. When she got to him, he was a little disoriented, but awake on the floor. [name] does not know if he had a seizure or not. There was no apparent head trauma. The patient did have some black stool and urine in his shorts. He is not quite sure what happened, but he believes he may have passed out for a few seconds. He has had some intermittent dizziness, particularly when he stands. Over the past several days, he has complained of epigastric abdominal pain, nausea, and intermittently black stools with bright red blood mixed in his stools. He has had some constipation, but no diarrhea, no pain with urination, no vomiting, no chest pain, and no swelling in his legs. He has had some shortness of breath and a poor appetite. He takes Xarelto daily but no other aspirin-like products.  In the ED, he is afebrile and mildly tachycardic. When he was assisted to the bedside commode, his heart rate increased to the 150s and he had some shortness of breath. His EKG revealed atrial fibrillation with a heart rate of 101 and bifascicular block. CT of his head revealed remote lacunar infarcts, but no acute intracranial abnormality. His lab data are significant for a BUN  of 33, creatinine 1.37, normal LFTs and lipase, BNP of 144, troponin I 0.08, hemoglobin of 10.4, and platelet count of 113. He is being admitted for further evaluation and management.     Review of Systems:  As above in history present illness. In addition, he has chronic shortness of breath, chronic pain in his sides, chronic low back pain, chronic joint pain; otherwise review systems is negative.    Past Medical History  Diagnosis Date  . COPD (chronic obstructive pulmonary disease) (HCC)     Uses occasional nighttime O2  . Disseminated herpes zoster 2010  . GERD (gastroesophageal reflux disease)   . Asthma   . Pulmonary embolus ([place])     2003 and 2011  . Pulmonary nodules     Chest CT 09/11  . Mixed hyperlipidemia   . Chronic atrial fibrillation ([place])   . Lupus anticoagulant positive   . Essential hypertension, benign   . Cholelithiasis   . Rheumatoid arthritis(714.0)   . History of chicken pox 1941; 2011  . Anemia   . Chronic lower back pain   . Chronic diastolic heart failure ([place])   . Cirrhosis ([place])     Questionable, AFP normal Feb 01/2012, hx of ETOH use   . Headache(784.0)   . Cervical spondylosis without myelopathy   . Stage III chronic kidney disease   . Cellulitis of leg, left 01/03/2015  . Aortic stenosis, moderate 05/21/2015  . Dysrhythmia     chronic AFib  . Diffuse large B cell lymphoma ([place]) 08/22/2015  . DNR (do not resuscitate) 11/01/2015  . Seizure disorder (  [place]) 09/2015   Past Surgical History  Procedure Laterality Date  . Polypectomy  02/02/2012    RMR: Multiple colonic polyps removed, flat, tubular adenomas/ Left-sided diverticulosis  . Posterior lumbar vertebrae excision  2003; 2009; 2011  . Myringotomy      "3 times; both ears"  . Cataract extraction w/ intraocular lens  implant, bilateral    . Cholecystectomy  05/23/2012    Procedure: LAPAROSCOPIC CHOLECYSTECTOMY WITH INTRAOPERATIVE CHOLANGIOGRAM;  Surgeon: Stark Klein, MD;  Location: [facility];   Service: General;  Laterality: N/A;  . Esophagogastroduodenoscopy  02/02/12    [id number] size hiatal hernia; otherwise normal exam  . Colonoscopy  01/24/2013    [id number] polyps/colonic diverticulosis  . Back surgery    . Portacath placement    . Lymph node biopsy    . Lymph node biopsy Right 09/11/2015    Procedure: RIGHT INGUINAL LYMPH NODE BIOPSY;  Surgeon: Aviva Signs, MD;  Location: [facility];  Service: General;  Laterality: Right;  . Portacath placement Left 09/11/2015    Procedure: INSERTION PORT-A-CATH;  Surgeon: Aviva Signs, MD;  Location: [facility];  Service: General;  Laterality: Left;   Social History: Patient is widowed. He has 3 children. Currently, he lives with one of his daughters, [name]. He  reports that he quit smoking about 17 years ago. His smoking use included Cigarettes. He has a 57 pack-year smoking history. He has never used smokeless tobacco. He reports that he does not drink alcohol or use illicit drugs. he ambulates with a cane.  Allergies  Allergen Reactions  . Cymbalta [Duloxetine Hcl] Other (See Comments)    Confusion   . Procaine Hcl Hives and Other (See Comments)    NOVOCAINE: Sweating, Confusion, Not in right state of mind.  [name]    unknown    Family History  Problem Relation Age of Onset  . Colon cancer Neg Hx   . Liver disease Neg Hx   . Diabetes Mother     Prior to Admission medications   Medication Sig Start Date End Date Taking? Authorizing Provider  albuterol (PROAIR HFA) 108 (90 BASE) MCG/ACT inhaler Inhale 2 puffs into the lungs every 6 (six) hours as needed. Shortness of Breath 08/25/15  Yes Radene Gunning, NP  allopurinol (ZYLOPRIM) 300 MG tablet Take 1 tablet (300 mg total) by mouth daily. 09/18/15  Yes Kathie Dike, MD  cetirizine (ZYRTEC) 10 MG tablet Take 10 mg by mouth daily.     Yes Historical Provider, MD  [place]atz 125 MCG tablet TAKE ONE TABLET BY MOUTH DAILY 09/28/15  Yes Satira Sark, MD    diltiazem (CARDIZEM CD) 240 MG 24 hr capsule Take 1 capsule (240 mg total) by mouth daily. 10/02/15  Yes Kathie Dike, MD  diphenhydrAMINE (BENADRYL) 25 MG tablet Take 25 mg by mouth every 6 (six) hours as needed for itching or allergies.   Yes Historical Provider, MD  ferrous sulfate 325 (65 FE) MG tablet Take 325 mg by mouth daily.   Yes Historical Provider, MD  fluticasone (FLONASE) 50 MCG/ACT nasal spray Place 2 sprays into both nostrils daily.  08/25/15  Yes Historical Provider, MD  folic acid (FOLVITE) 1 MG tablet Take 1 mg by mouth daily.    Yes Historical Provider, MD  furosemide (LASIX) 40 MG tablet Take 1 tablet (40 mg total) by mouth daily. Take daily for prevention of fluid buildup. 10/02/15  Yes Kathie Dike, MD  gabapentin (NEURONTIN) 300 MG capsule Take  2 capsules (600 mg total) by mouth 2 (two) times daily. 08/25/15  Yes Lezlie Octave Black, NP  ipratropium-albuterol (DUONEB) 0.5-2.5 (3) MG/3ML SOLN Take 3 mLs by nebulization every 6 (six) hours as needed. Patient taking differently: Take 3 mLs by nebulization every 6 (six) hours as needed (shortness of breath).  09/18/15  Yes Kathie Dike, MD  ketoconazole (NIZORAL) 2 % shampoo Apply 1 application topically 3 (three) times a week.  05/21/13  Yes Historical Provider, MD  levETIRAcetam (KEPPRA) 500 MG tablet Take 1 tablet (500 mg total) by mouth 2 (two) times daily. 10/21/15  Yes Samuella Cota, MD  Omega-3 Fatty Acids (FISH OIL) 1000 MG CAPS Take 2 capsules by mouth 2 (two) times daily.    Yes Historical Provider, MD  omeprazole (PRILOSEC) 40 MG capsule Take 40 mg by mouth daily.   Yes Historical Provider, MD  ondansetron (ZOFRAN) 8 MG tablet Take 1 tablet every 8 hours as needed for nausea/vomiting. Patient taking differently: Take 8 mg by mouth every 8 (eight) hours as needed for nausea or vomiting.  09/16/15  Yes Baird Cancer, PA-C  Oxycodone HCl 10 MG TABS Take 1 tablet (10 mg total) by mouth every 6 (six) hours as needed (pain).  11/13/15  Yes Manon Hilding Kefalas, PA-C  potassium chloride SA (K-DUR,KLOR-CON) 20 MEQ tablet Take 0.5 tablets (10 mEq total) by mouth daily. Starting 08/27/15. 08/25/15  Yes Lezlie Octave Black, NP  predniSONE (DELTASONE) 20 MG tablet On Days 1-5 of chemo take 4.5 tablets (90mg ) daily. 10/05/15  Yes Baird Cancer, PA-C  prochlorperazine (COMPAZINE) 10 MG tablet Take 1 tablet (10 mg total) by mouth every 6 (six) hours as needed (Nausea or vomiting). Patient taking differently: Take 10 mg by mouth every 6 (six) hours as needed (Nausea or vomiting). nausea 09/16/15  Yes Baird Cancer, PA-C  Rivaroxaban (XARELTO STARTER PACK) 15 & 20 MG TBPK Take as directed on package: Start with one 15mg  tablet by mouth twice a day with food. On Day 22, switch to one 20mg  tablet once a day with food. 10/05/15  Yes Baird Cancer, PA-C  Sennosides-Docusate Sodium (SENOKOT S PO) Take 1 tablet by mouth daily.    Yes Historical Provider, MD  tadalafil (CIALIS) 5 MG tablet Take 5 mg by mouth daily as needed for erectile dysfunction.  05/18/15  Yes Historical Provider, MD  tamsulosin (FLOMAX) 0.4 MG CAPS capsule Take 1 capsule (0.4 mg total) by mouth at bedtime. For prostate treatment. 05/25/15  Yes Rexene Alberts, MD  Vitamin D, Ergocalciferol, (DRISDOL) 50000 UNITS CAPS capsule Take 1 capsule by mouth once a week. friday 04/27/15  Yes Historical Provider, MD  lidocaine-prilocaine (EMLA) cream Apply a quarter size amount to port site 1 hour prior to chemo. Do not rub in. Cover with plastic wrap. 09/17/15   Patrici Ranks, MD   Physical Exam: Filed Vitals:   11/28/15 1330 11/28/15 1434 11/28/15 1517 11/28/15 1648  BP: 101/69 111/58 121/99 96/68  Pulse: 87 90 102 96  Temp:      TempSrc:      Resp: 21 20 17 11   SpO2: 100% 99% 98% 100%    Wt Readings from Last 3 Encounters:  11/02/15 78.926 kg (174 lb)  10/21/15 82.7 kg (182 lb 5.1 oz)  10/12/15 88.814 kg (195 lb 12.8 oz)    General:  Appears calm and comfortable;  pleasant [name], alert, in no acute distress. Head/scalp: No nodules or ecchymotic areas. Eyes: PERRL,  normal lids, irises & conjunctiva; conjunctivae are clear and sclerae are white. ENT: grossly normal hearing; oropharynx reveals dry mucous membranes. Neck: no LAD, masses or thyromegaly Cardiovascular: Irregular, irregular, with borderline tachycardia. No pedal edema. Telemetry: Atrial fibrillation  Respiratory: CTA bilaterally, no w/r/r. Normal respiratory effort. Abdomen: Positive bowel sounds, soft, mild to moderate epigastric tenderness; nondistended; questionable fullness in the hypogastrium. GI: Rectal exam revealed moderate sticky stool in the rectum, very black/ melanotic. Skin: no rash or induration seen on limited exam Musculoskeletal: grossly normal tone BUE/BLE; no acute hot red joints. Psychiatric: Patient's speech is clear. He has a pleasant affect. He is alert. Neurologic: Patient is alert and oriented 2. Cranial nerves II through XII are grossly intact. Sensation to cold touch is intact. Handgrip is 5 minus over 5 bilaterally.           Labs on Admission:  Basic Metabolic Panel:  Recent Labs Lab 11/28/15 1358  NA 138  K 3.6  CL 97*  CO2 33*  GLUCOSE 116*  BUN 33*  CREATININE 1.37*  CALCIUM 9.2   Liver Function Tests:  Recent Labs Lab 11/28/15 1358  AST 21  ALT 11*  ALKPHOS 58  BILITOT 1.0  PROT 6.3*  ALBUMIN 3.1*    Recent Labs Lab 11/28/15 1358  LIPASE 20   No results for input(s): AMMONIA in the last 168 hours. CBC:  Recent Labs Lab 11/28/15 1358  WBC 6.8  NEUTROABS 4.6  HGB 10.4*  HCT 30.7*  MCV 93.6  PLT 113*   Cardiac Enzymes:  Recent Labs Lab 11/28/15 1358  TROPONINI 0.08*    BNP (last 3 results)  Recent Labs  09/13/15 0650 09/29/15 1222 11/28/15 1357  BNP 595.0* 226.0* 144.0*    ProBNP (last 3 results) No results for input(s): PROBNP in the last 8760 hours.  CBG: No results for input(s):  GLUCAP in the last 168 hours.  Radiological Exams on Admission: Ct Head Wo Contrast  11/28/2015  CLINICAL DATA:  Syncopal episodes over the last several days when standing. Generalized weakness. Fatigue and decreased appetite. Fall. The patient takes Xarelto. Initial encounter. EXAM: CT HEAD WITHOUT CONTRAST TECHNIQUE: Contiguous axial images were obtained from the base of the skull through the vertex without intravenous contrast. COMPARISON:  CT head without contrast 10/18/2015 FINDINGS: Remote lacunar infarcts of the left basal ganglia are stable. No acute cortical infarct, hemorrhage, or mass lesion is present. The basal ganglia are otherwise intact. The insular ribbon is intact bilaterally. No focal cortical defects are evident. Mild generalized atrophy is present. Ventricles are proportionate to the degree of atrophy. The paranasal sinuses and mastoid air cells are clear. The calvarium is intact. No focal extracranial soft tissue lesions are present. Bilateral lens replacements are noted. The globes and orbits are otherwise intact. IMPRESSION: 1. No acute intracranial abnormality or significant interval change. 2. Stable atrophy and white matter disease. 3. 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 Electronically Signed   By: San Morelle M.D.   On: 11/28/2015 17:15   Dg Chest Port 1 View  11/28/2015  CLINICAL DATA:  Dyspnea.  History of asthma EXAM: PORTABLE CHEST 1 VIEW COMPARISON:  10/18/2015 FINDINGS: Right pleural effusion seen previously has resolved. There is chronic hyperinflation and interstitial coarsening consistent with COPD. There is no edema, consolidation, effusion, or pneumothorax. Left subclavian porta catheter, tip at the SVC origin. Stable heart size and aortic contours. IMPRESSION: COPD without acute superimposed finding. Electronically Signed   By: Neva Seat.D.  On: 11/28/2015 14:28    EKG: Independently reviewed.   Assessment/Plan Principal Problem:    Syncope and collapse Active Problems:   Dehydration   Elevated troponin I level   GI bleed   Epigastric abdominal pain   Chronic atrial fibrillation (HCC)   Diffuse large B cell lymphoma (HCC)   Chronic respiratory failure with hypoxia (HCC)   AKI (acute kidney injury) (HCC)   Chronic diastolic heart failure (HCC)   Thrombocytopenia (HCC)   History of pulmonary embolism   Normocytic anemia   1. Syncope with collapse. The etiology is unclear, but could be multifactorial including orthostatic hypotension secondary to dehydration from chronic GI bleed, possible seizure, and arrhythmia. It is more likely that the etiology is orthostatic hypotension from dehydration rather than seizure or arrhythmia. CT scan of his head is nonacute. Neurologically, he appears to be at baseline currently. For management, the patient was given a bolus of IV fluids in the ED. We'll hold Lasix and hydrate with normal saline. We'll continue Keppra and supportive treatment. 2. Epigastric pain with melena. Patient is treated with Xarelto for his history of A. fib and PE. He does have epigastric abdominal pain and melena. Patient was given 80 mg of IV Protonix in the ED. Will continue Protonix 40 mg every 12 hours IV. Will hold Xarelto. I have discussed the patient with gastroenterologist, Dr. Laural Golden. He agreed with current management. He requested patient be made nothing by mouth after midnight for a possible EGD tomorrow. 3. Chronic atrial fibrillation with mild RVR. The patient is treated chronically with digoxin and diltiazem. His heart rate increased when he stood up, likely from dehydration. Because he has soft blood pressures, will change diltiazem to 30 mg every 6 hours rather than 240 mg daily. Will continue digoxin. We'll check a digoxin level. Will hold Xarelto secondary to the GI bleed. Continue IV fluids started. 4. Elevated troponin I. Patient's troponin I is mildly elevated at 0.08. The etiology is likely from  dehydration, RVR, acute kidney injury rather than ACS. We'll cycle his cardiac enzymes. In the setting of low-normal blood pressure and GI bleed, will hold off on beta blocker and anticoagulation. Of note, the patient has no chest pain. 5. Chronic diastolic heart failure. Currently compensated. The patient is volume depleted. We'll hold Lasix and hydrate for the next 24-48 hours. Consider restarting Lasix 10. 6. Dehydration/acute kidney injury. The patient has a history of mild CKD, likely stage II. However, his creatinine 4 weeks ago was 1.14. It is 1.37 on admission. The increase in his BUN/creatinine is likely from dehydration from subacute/chronic GI bleeding and prerenal azotemia in the setting of Lasix therapy. As above, will continue IV fluids ordered and hold Lasix. 7. Normocytic anemia. Patient's baseline hemoglobin ranges from 10-11 g. It is 10.4 on admission, at baseline. Expect his hemoglobin to decrease in the setting of chronic GI bleeding and hydration.  CBC will be ordered every 8 hours 24 hours. 8. Thrombocytopenia. The patient's platelet count is 113. 4 weeks ago, it was within normal limits. His vital B12 level was within normal limits 2 months ago. The low platelet count may be secondary to the GI bleeding and/or chemotherapy. We'll continue to follow. 9. Stage IV DLBCL with positive bone marrow involvement. The patient is followed by Dr. Whitney Muse. He was recently transitioned to palliative single agent chemotherapy. Will inform Dr. Whitney Muse of the patient's admission and consult her formally if needed.    Code Status: DO NOT RESUSCITATE. DVT Prophylaxis:  SCDs Family Communication: Discussed with daughters Disposition Plan: Anticipate discharge in the next 2-3 days.  Time spent: One hour  [facility] Pager [phone].

## 2015-11-28 NOTE — ED Notes (Signed)
AC aware of needed meds.

## 2015-11-28 NOTE — ED Provider Notes (Signed)
CSN: [id number]     Arrival date & time 11/28/15  1254 History   First MD Initiated Contact with Patient 11/28/15 1259     Chief Complaint  Patient presents with  . Loss of Consciousness     (Consider location/radiation/quality/duration/timing/severity/associated sxs/prior Treatment) HPI   81 y.o. male with a history of stage IV metastatic lymphoma on palliative chemotherapy, chronic atrial fibrillation on anticoagulation with Xarelto, chronic diastolic heart failure, prior pulmonary embolus, seizure disorder, and oxygen dependent COPD. He presents from home after apparently passing out. The history is being provided by the patient and his daughter [name]. Accordingly, this morning, the patient got up to go use the bathroom. [name] heard the patient fall. When she got to him, he was a little disoriented, but awake on the floor. [name] does not know if he had a seizure or not. There was no apparent head trauma. The patient did have some black stool and urine in his shorts. He is not quite sure what happened, but he believes he may have passed out for a few seconds. He has had some intermittent dizziness, particularly when he stands.Over the past several days, he has complained of epigastric abdominal pain, nausea, and intermittently black stools with bright red blood mixed in his stools  Past Medical History  Diagnosis Date  . COPD (chronic obstructive pulmonary disease) (HCC)     Uses occasional nighttime O2  . Disseminated herpes zoster 2010  . GERD (gastroesophageal reflux disease)   . Asthma   . Pulmonary embolus ([place])     2003 and 2011  . Pulmonary nodules     Chest CT 09/11  . Mixed hyperlipidemia   . Chronic atrial fibrillation ([place])   . Lupus anticoagulant positive   . Essential hypertension, benign   . Cholelithiasis   . Rheumatoid arthritis(714.0)   . History of chicken pox 1941; 2011  . Anemia   . Chronic lower back pain   . Chronic diastolic heart failure ([place])   .  Cirrhosis ([place])     Questionable, AFP normal Feb 01/2012, hx of ETOH use   . Headache(784.0)   . Cervical spondylosis without myelopathy   . Stage III chronic kidney disease   . Cellulitis of leg, left 01/03/2015  . Aortic stenosis, moderate 05/21/2015  . Dysrhythmia     chronic AFib  . Diffuse large B cell lymphoma ([place]) 08/22/2015  . DNR (do not resuscitate) 11/01/2015   Past Surgical History  Procedure Laterality Date  . Polypectomy  02/02/2012    RMR: Multiple colonic polyps removed, flat, tubular adenomas/ Left-sided diverticulosis  . Posterior lumbar vertebrae excision  2003; 2009; 2011  . Myringotomy      "3 times; both ears"  . Cataract extraction w/ intraocular lens  implant, bilateral    . Cholecystectomy  05/23/2012    Procedure: LAPAROSCOPIC CHOLECYSTECTOMY WITH INTRAOPERATIVE CHOLANGIOGRAM;  Surgeon: Stark Klein, MD;  Location: [facility];  Service: General;  Laterality: N/A;  . Esophagogastroduodenoscopy  02/02/12    [id number] size hiatal hernia; otherwise normal exam  . Colonoscopy  01/24/2013    [id number] polyps/colonic diverticulosis  . Back surgery    . Portacath placement    . Lymph node biopsy    . Lymph node biopsy Right 09/11/2015    Procedure: RIGHT INGUINAL LYMPH NODE BIOPSY;  Surgeon: Aviva Signs, MD;  Location: [facility];  Service: General;  Laterality: Right;  . Portacath placement Left 09/11/2015    Procedure: INSERTION PORT-A-CATH;  Surgeon: Aviva Signs, MD;  Location: [facility];  Service: General;  Laterality: Left;   Family History  Problem Relation Age of Onset  . Colon cancer Neg Hx   . Liver disease Neg Hx   . Diabetes Mother    Social History  Substance Use Topics  . Smoking status: Former Smoker -- 1.00 packs/day for 57 years    Types: Cigarettes    Quit date: 01/27/1998  . Smokeless tobacco: Never Used  . Alcohol Use: No     Comment: "quit drinking 1976"    Review of Systems  All systems reviewed and negative, other than as noted in  HPI.   Allergies  Cymbalta; Procaine hcl; and Bystolic  Home Medications   Prior to Admission medications   Medication Sig Start Date End Date Taking? Authorizing Provider  albuterol (PROAIR HFA) 108 (90 BASE) MCG/ACT inhaler Inhale 2 puffs into the lungs every 6 (six) hours as needed. Shortness of Breath 08/25/15   Radene Gunning, NP  allopurinol (ZYLOPRIM) 300 MG tablet Take 1 tablet (300 mg total) by mouth daily. 09/18/15   Kathie Dike, MD  cetirizine (ZYRTEC) 10 MG tablet Take 10 mg by mouth daily.      Historical Provider, MD  [place] 125 MCG tablet TAKE ONE TABLET BY MOUTH DAILY 09/28/15   Satira Sark, MD  diltiazem (CARDIZEM CD) 240 MG 24 hr capsule Take 1 capsule (240 mg total) by mouth daily. 10/02/15   Kathie Dike, MD  ferrous sulfate 325 (65 FE) MG tablet Take 325 mg by mouth daily.    Historical Provider, MD  fluticasone (FLONASE) 50 MCG/ACT nasal spray Place 2 sprays into both nostrils daily.  08/25/15   Historical Provider, MD  folic acid (FOLVITE) 1 MG tablet Take 1 mg by mouth daily.     Historical Provider, MD  furosemide (LASIX) 40 MG tablet Take 1 tablet (40 mg total) by mouth daily. Take daily for prevention of fluid buildup. 10/02/15   Kathie Dike, MD  gabapentin (NEURONTIN) 300 MG capsule Take 2 capsules (600 mg total) by mouth 2 (two) times daily. 08/25/15   Radene Gunning, NP  ipratropium-albuterol (DUONEB) 0.5-2.5 (3) MG/3ML SOLN Take 3 mLs by nebulization every 6 (six) hours as needed. Patient taking differently: Take 3 mLs by nebulization every 6 (six) hours as needed (shortness of breath).  09/18/15   Kathie Dike, MD  ketoconazole (NIZORAL) 2 % shampoo Apply 1 application topically 3 (three) times a week.  05/21/13   Historical Provider, MD  levETIRAcetam (KEPPRA) 500 MG tablet Take 1 tablet (500 mg total) by mouth 2 (two) times daily. 10/21/15   Samuella Cota, MD  lidocaine-prilocaine (EMLA) cream Apply a quarter size amount to port site 1 hour prior to  chemo. Do not rub in. Cover with plastic wrap. 09/17/15   Patrici Ranks, MD  Omega-3 Fatty Acids (FISH OIL) 1000 MG CAPS Take 2 capsules by mouth 2 (two) times daily.     Historical Provider, MD  omeprazole (PRILOSEC) 40 MG capsule Take 40 mg by mouth daily.    Historical Provider, MD  ondansetron (ZOFRAN) 8 MG tablet Take 1 tablet every 8 hours as needed for nausea/vomiting. Patient taking differently: Take 8 mg by mouth every 8 (eight) hours as needed for nausea or vomiting.  09/16/15   Baird Cancer, PA-C  Oxycodone HCl 10 MG TABS Take 1 tablet (10 mg total) by mouth every 6 (six) hours as needed (pain). 11/13/15   Baird Cancer, PA-C  potassium chloride SA (K-DUR,KLOR-CON) 20 MEQ tablet Take 0.5 tablets (10 mEq total) by mouth daily. Starting 08/27/15. 08/25/15   Radene Gunning, NP  predniSONE (DELTASONE) 20 MG tablet On Days 1-5 of chemo take 4.5 tablets (90mg ) daily. 10/05/15   Baird Cancer, PA-C  prochlorperazine (COMPAZINE) 10 MG tablet Take 1 tablet (10 mg total) by mouth every 6 (six) hours as needed (Nausea or vomiting). Patient taking differently: Take 10 mg by mouth every 6 (six) hours as needed (Nausea or vomiting). nausea 09/16/15   Baird Cancer, PA-C  Rivaroxaban (XARELTO STARTER PACK) 15 & 20 MG TBPK Take as directed on package: Start with one 15mg  tablet by mouth twice a day with food. On Day 22, switch to one 20mg  tablet once a day with food. 10/05/15   Baird Cancer, PA-C  Sennosides-Docusate Sodium (SENOKOT S PO) Take 1 tablet by mouth daily.     Historical Provider, MD  tadalafil (CIALIS) 5 MG tablet Take 5 mg by mouth daily as needed for erectile dysfunction.  05/18/15   Historical Provider, MD  tamsulosin (FLOMAX) 0.4 MG CAPS capsule Take 1 capsule (0.4 mg total) by mouth at bedtime. For prostate treatment. 05/25/15   Rexene Alberts, MD  Vitamin D, Ergocalciferol, (DRISDOL) 50000 UNITS CAPS capsule Take 1 capsule by mouth once a week. friday 04/27/15   Historical  Provider, MD   There were no vitals taken for this visit. Physical Exam  Constitutional: He appears well-developed and well-nourished. No distress.  HENT:  Head: Normocephalic and atraumatic.  Eyes: Conjunctivae are normal. Right eye exhibits no discharge. Left eye exhibits no discharge.  Neck: Neck supple.  Cardiovascular: Regular rhythm and normal heart sounds.  Exam reveals no gallop and no friction rub.   No murmur heard. irreg irreg  Pulmonary/Chest: Effort normal and breath sounds normal. No respiratory distress.  Abdominal: Soft. He exhibits no distension. There is no tenderness.  Musculoskeletal: He exhibits no edema or tenderness.  Neurological: He is alert.  Skin: Skin is warm and dry.  Psychiatric: He has a normal mood and affect. His behavior is normal. Thought content normal.  Nursing note and vitals reviewed.   ED Course  Procedures (including critical care time) Labs Review Labs Reviewed  MRSA PCR SCREENING - Abnormal; Notable for the following:    MRSA by PCR POSITIVE (*)    All other components within normal limits  CBC WITH DIFFERENTIAL/PLATELET - Abnormal; Notable for the following:    RBC 3.28 (*)    Hemoglobin 10.4 (*)    HCT 30.7 (*)    Platelets 113 (*)    All other components within normal limits  COMPREHENSIVE METABOLIC PANEL - Abnormal; Notable for the following:    Chloride 97 (*)    CO2 33 (*)    Glucose, Bld 116 (*)    BUN 33 (*)    Creatinine, Ser 1.37 (*)    Total Protein 6.3 (*)    Albumin 3.1 (*)    ALT 11 (*)    GFR calc non Af Amer 46 (*)    GFR calc Af Amer 54 (*)    All other components within normal limits  BRAIN NATRIURETIC PEPTIDE - Abnormal; Notable for the following:    B Natriuretic Peptide 144.0 (*)    All other components within normal limits  TROPONIN I - Abnormal; Notable for the following:    Troponin I 0.08 (*)    All other components within normal limits  CBC -  Abnormal; Notable for the following:    RBC 2.68 (*)     Hemoglobin 8.5 (*)    HCT 25.1 (*)    Platelets 91 (*)    All other components within normal limits  DIGOXIN LEVEL - Abnormal; Notable for the following:    Digoxin Level 0.7 (*)    All other components within normal limits  TROPONIN I - Abnormal; Notable for the following:    Troponin I 0.08 (*)    All other components within normal limits  TROPONIN I - Abnormal; Notable for the following:    Troponin I 0.09 (*)    All other components within normal limits  TROPONIN I - Abnormal; Notable for the following:    Troponin I 0.08 (*)    All other components within normal limits  COMPREHENSIVE METABOLIC PANEL - Abnormal; Notable for the following:    Potassium 3.4 (*)    Chloride 99 (*)    Glucose, Bld 101 (*)    BUN 24 (*)    Calcium 8.5 (*)    Total Protein 5.2 (*)    Albumin 2.6 (*)    ALT 10 (*)    GFR calc non Af Amer 55 (*)    All other components within normal limits  CBC - Abnormal; Notable for the following:    RBC 2.65 (*)    Hemoglobin 8.4 (*)    HCT 25.2 (*)    Platelets 84 (*)    All other components within normal limits  CBC - Abnormal; Notable for the following:    RBC 3.56 (*)    Hemoglobin 11.1 (*)    HCT 33.0 (*)    RDW 16.2 (*)    Platelets 101 (*)    All other components within normal limits  PROTIME-INR - Abnormal; Notable for the following:    Prothrombin Time 15.7 (*)    All other components within normal limits  CBC - Abnormal; Notable for the following:    RBC 3.32 (*)    Hemoglobin 10.4 (*)    HCT 31.5 (*)    RDW 15.7 (*)    Platelets 90 (*)    All other components within normal limits  COMPREHENSIVE METABOLIC PANEL - Abnormal; Notable for the following:    Calcium 8.2 (*)    Total Protein 5.2 (*)    Albumin 2.6 (*)    ALT 11 (*)    All other components within normal limits  CBC - Abnormal; Notable for the following:    RBC 3.23 (*)    Hemoglobin 10.2 (*)    HCT 30.4 (*)    Platelets 77 (*)    All other components within normal  limits  POC OCCULT BLOOD, ED - Abnormal; Notable for the following:    Fecal Occult Bld POSITIVE (*)    All other components within normal limits  URINALYSIS, ROUTINE W REFLEX MICROSCOPIC (NOT AT [facility])  LIPASE, BLOOD  HEPATITIS C ANTIBODY  GLUCOSE, CAPILLARY  TYPE AND SCREEN  PREPARE RBC (CROSSMATCH)  SURGICAL PATHOLOGY    Imaging Review No results found.   Ct Head Wo Contrast  11/28/2015  CLINICAL DATA:  Syncopal episodes over the last several days when standing. Generalized weakness. Fatigue and decreased appetite. Fall. The patient takes Xarelto. Initial encounter. EXAM: CT HEAD WITHOUT CONTRAST TECHNIQUE: Contiguous axial images were obtained from the base of the skull through the vertex without intravenous contrast. COMPARISON:  CT head without contrast 10/18/2015 FINDINGS: Remote lacunar infarcts of the left  basal ganglia are stable. No acute cortical infarct, hemorrhage, or mass lesion is present. The basal ganglia are otherwise intact. The insular ribbon is intact bilaterally. No focal cortical defects are evident. Mild generalized atrophy is present. Ventricles are proportionate to the degree of atrophy. The paranasal sinuses and mastoid air cells are clear. The calvarium is intact. No focal extracranial soft tissue lesions are present. Bilateral lens replacements are noted. The globes and orbits are otherwise intact. IMPRESSION: 1. No acute intracranial abnormality or significant interval change. 2. Stable atrophy and white matter disease. 3. 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 Electronically Signed   By: San Morelle M.D.   On: 11/28/2015 17:15   Dg Chest Port 1 View  11/28/2015  CLINICAL DATA:  Dyspnea.  History of asthma EXAM: PORTABLE CHEST 1 VIEW COMPARISON:  10/18/2015 FINDINGS: Right pleural effusion seen previously has resolved. There is chronic hyperinflation and interstitial coarsening consistent with COPD. There is no edema, consolidation, effusion, or  pneumothorax. Left subclavian porta catheter, tip at the SVC origin. Stable heart size and aortic contours. IMPRESSION: COPD without acute superimposed finding. Electronically Signed   By: Monte Fantasia M.D.   On: 11/28/2015 14:28  I have personally reviewed and evaluated these images and lab results as part of my medical decision-making.   EKG Interpretation   Date/Time:  Saturday November 28 2015 13:02:58 EST Ventricular Rate:  101 PR Interval:    QRS Duration: 122 QT Interval:  421 QTC Calculation: 546 R Axis:   -65 Text Interpretation:  Atrial fibrillation RBBB and LAFB Confirmed by Donatella Walski   MD, Nyja Westbrook ([id number]) on 11/28/2015 1:41:23 PM      MDM   Final diagnoses:  Dyspnea  syncope Gi bleed  81 year old male with possible syncope. Endorses continued orthostatic symptoms. The symptoms also suggestive of possible GI bleed.    Virgel Manifold, MD 12/05/15 [phone]

## 2015-11-28 NOTE — ED Notes (Signed)
Syncopal episodes x past few days when standing. Generalized weakness. Fatigue. Decreased appetite. CBG en route 168

## 2015-11-29 ENCOUNTER — Encounter (HOSPITAL_COMMUNITY)
Admission: EM | Disposition: A | Discharge status: Home Health | Payer: Self-pay | Source: Home / Self Care | Attending: Internal Medicine

## 2015-11-29 DIAGNOSIS — K7460 Unspecified cirrhosis of liver: Secondary | ICD-10-CM

## 2015-11-29 DIAGNOSIS — D649 Anemia, unspecified: Secondary | ICD-10-CM

## 2015-11-29 DIAGNOSIS — I781 Nevus, non-neoplastic: Secondary | ICD-10-CM

## 2015-11-29 DIAGNOSIS — Z86711 Personal history of pulmonary embolism: Secondary | ICD-10-CM

## 2015-11-29 DIAGNOSIS — D696 Thrombocytopenia, unspecified: Secondary | ICD-10-CM

## 2015-11-29 DIAGNOSIS — Z7901 Long term (current) use of anticoagulants: Secondary | ICD-10-CM

## 2015-11-29 DIAGNOSIS — I482 Chronic atrial fibrillation: Secondary | ICD-10-CM

## 2015-11-29 DIAGNOSIS — K219 Gastro-esophageal reflux disease without esophagitis: Secondary | ICD-10-CM

## 2015-11-29 DIAGNOSIS — I2699 Other pulmonary embolism without acute cor pulmonale: Secondary | ICD-10-CM

## 2015-11-29 DIAGNOSIS — K921 Melena: Secondary | ICD-10-CM

## 2015-11-29 DIAGNOSIS — K3189 Other diseases of stomach and duodenum: Secondary | ICD-10-CM

## 2015-11-29 DIAGNOSIS — K449 Diaphragmatic hernia without obstruction or gangrene: Secondary | ICD-10-CM

## 2015-11-29 DIAGNOSIS — K31819 Angiodysplasia of stomach and duodenum without bleeding: Secondary | ICD-10-CM

## 2015-11-29 HISTORY — DX: Nevus, non-neoplastic: I78.1

## 2015-11-29 HISTORY — PX: ESOPHAGOGASTRODUODENOSCOPY: SHX5428

## 2015-11-29 LAB — COMPREHENSIVE METABOLIC PANEL
ALBUMIN: 2.6 g/dL — AB (ref 3.5–5.0)
ALK PHOS: 49 U/L (ref 38–126)
ALT: 10 U/L — AB (ref 17–63)
AST: 18 U/L (ref 15–41)
Anion gap: 9 (ref 5–15)
BUN: 24 mg/dL — AB (ref 6–20)
CALCIUM: 8.5 mg/dL — AB (ref 8.9–10.3)
CHLORIDE: 99 mmol/L — AB (ref 101–111)
CO2: 30 mmol/L (ref 22–32)
CREATININE: 1.19 mg/dL (ref 0.61–1.24)
GFR calc non Af Amer: 55 mL/min — ABNORMAL LOW (ref >60)
GLUCOSE: 101 mg/dL — AB (ref 65–99)
Potassium: 3.4 mmol/L — ABNORMAL LOW (ref 3.5–5.1)
SODIUM: 138 mmol/L (ref 135–145)
Total Bilirubin: 1.1 mg/dL (ref 0.3–1.2)
Total Protein: 5.2 g/dL — ABNORMAL LOW (ref 6.5–8.1)

## 2015-11-29 LAB — PROTIME-INR
INR: 1.23 (ref 0.00–1.49)
Prothrombin Time: 15.7 seconds — ABNORMAL HIGH (ref 11.6–15.2)

## 2015-11-29 LAB — CBC
HCT: 25.2 % — ABNORMAL LOW (ref 39.0–52.0)
HCT: 33 % — ABNORMAL LOW (ref 39.0–52.0)
HEMATOCRIT: 25.1 % — AB (ref 39.0–52.0)
HEMOGLOBIN: 11.1 g/dL — AB (ref 13.0–17.0)
HEMOGLOBIN: 8.5 g/dL — AB (ref 13.0–17.0)
Hemoglobin: 8.4 g/dL — ABNORMAL LOW (ref 13.0–17.0)
MCH: 31.2 pg (ref 26.0–34.0)
MCH: 31.7 pg (ref 26.0–34.0)
MCH: 31.7 pg (ref 26.0–34.0)
MCHC: 33.3 g/dL (ref 30.0–36.0)
MCHC: 33.6 g/dL (ref 30.0–36.0)
MCHC: 33.9 g/dL (ref 30.0–36.0)
MCV: 93.7 fL (ref 78.0–100.0)
MCV: 95.1 fL (ref 78.0–100.0)
MCV: 92.7 fL (ref 78.0–100.0)
PLATELETS: 84 10*3/uL — AB (ref 150–400)
Platelets: 91 10*3/uL — ABNORMAL LOW (ref 150–400)
Platelets: 101 10*3/uL — ABNORMAL LOW (ref 150–400)
RBC: 2.68 MIL/uL — AB (ref 4.22–5.81)
RBC: 2.65 MIL/uL — AB (ref 4.22–5.81)
RBC: 3.56 MIL/uL — ABNORMAL LOW (ref 4.22–5.81)
RDW: 15.1 % (ref 11.5–15.5)
RDW: 15.2 % (ref 11.5–15.5)
RDW: 16.2 % — AB (ref 11.5–15.5)
WBC: 5.2 10*3/uL (ref 4.0–10.5)
WBC: 5.2 10*3/uL (ref 4.0–10.5)
WBC: 6.9 10*3/uL (ref 4.0–10.5)

## 2015-11-29 LAB — MRSA PCR SCREENING: MRSA BY PCR: POSITIVE — AB

## 2015-11-29 LAB — PREPARE RBC (CROSSMATCH)

## 2015-11-29 LAB — URINALYSIS, ROUTINE W REFLEX MICROSCOPIC
BILIRUBIN URINE: NEGATIVE
Glucose, UA: NEGATIVE mg/dL
HGB URINE DIPSTICK: NEGATIVE
KETONES UR: NEGATIVE mg/dL
Leukocytes, UA: NEGATIVE
Nitrite: NEGATIVE
PROTEIN: NEGATIVE mg/dL
Specific Gravity, Urine: 1.01 (ref 1.005–1.030)
pH: 5.5 (ref 5.0–8.0)

## 2015-11-29 LAB — TROPONIN I
TROPONIN I: 0.08 ng/mL — AB (ref <0.031)
Troponin I: 0.09 ng/mL — ABNORMAL HIGH (ref <0.031)

## 2015-11-29 SURGERY — EGD (ESOPHAGOGASTRODUODENOSCOPY)
Anesthesia: Moderate Sedation

## 2015-11-29 MED ORDER — SODIUM CHLORIDE 0.9 % IV SOLN: Freq: Once | INTRAVENOUS | Status: DC

## 2015-11-29 MED ORDER — BUTAMBEN-TETRACAINE-BENZOCAINE 2-2-14 % EX AERO
INHALATION_SPRAY | CUTANEOUS | Status: DC | PRN
  Administered 2015-11-29: 2 via TOPICAL

## 2015-11-29 MED ORDER — MEPERIDINE HCL 50 MG/ML IJ SOLN
INTRAMUSCULAR | Status: AC
  Filled 2015-11-29: qty 1

## 2015-11-29 MED ORDER — MEPERIDINE HCL 50 MG/ML IJ SOLN
INTRAMUSCULAR | Status: DC | PRN
  Administered 2015-11-29: 25 mg

## 2015-11-29 MED ORDER — MIDAZOLAM HCL 5 MG/5ML IJ SOLN
INTRAMUSCULAR | Status: AC
  Filled 2015-11-29: qty 10

## 2015-11-29 MED ORDER — MUPIROCIN 2 % EX OINT
1.0000 "application " | TOPICAL_OINTMENT | Freq: Two times a day (BID) | CUTANEOUS | Status: DC
  Administered 2015-11-29 – 2015-12-03 (×9): 1 via NASAL
  Filled 2015-11-29 (×4): qty 22

## 2015-11-29 MED ORDER — MIDAZOLAM HCL 5 MG/5ML IJ SOLN
INTRAMUSCULAR | Status: DC | PRN
  Administered 2015-11-29 (×2): 1 mg via INTRAVENOUS

## 2015-11-29 MED ORDER — SODIUM CHLORIDE 0.9 % IV SOLN: INTRAVENOUS | Status: DC

## 2015-11-29 MED ORDER — DIPHENHYDRAMINE HCL 12.5 MG/5ML PO ELIX: 12.5000 mg | ORAL_SOLUTION | Freq: Three times a day (TID) | ORAL | Status: DC | PRN

## 2015-11-29 MED ORDER — CHLORHEXIDINE GLUCONATE CLOTH 2 % EX PADS
6.0000 | MEDICATED_PAD | Freq: Every day | CUTANEOUS | Status: DC
  Administered 2015-11-29 – 2015-12-01 (×2): 6 via TOPICAL

## 2015-11-29 MED ORDER — METOCLOPRAMIDE HCL 5 MG/ML IJ SOLN
10.0000 mg | Freq: Once | INTRAMUSCULAR | Status: AC
  Administered 2015-11-29: 10 mg via INTRAVENOUS
  Filled 2015-11-29: qty 2

## 2015-11-29 NOTE — Progress Notes (Addendum)
[facility] PROGRESS NOTE  [name] [id number] DOB: 09-04-1931 DOA: 11/28/2015 PCP: [name], MD  Assessment/Plan: 1. Suspected upper GI bleed -[name] having a history of A. fib and pulmonary embolism for which he had been anticoagulated with Xarelto, presenting with complaints of dark stools mixed with bright red blood per rectum over the past week. -Initial lab work showed a hemoglobin of 10.4 that trended down to 8.5 overnight. -Anticoagulation has been discontinued on admission. -GI consulted plan for patient to undergo upper endoscopy today. Case was discussed with Dr.Rehman who recommended capsule endoscopy if EGD is unremarkable. -Continue IV Protonix  2.  Acute blood loss anemia -Patient presented with dark stools mixed with bright red blood per rectum, in the setting of chronic anticoagulation. -Overnight his hemoglobin dropped by 2 points, coming down from 10.4-8.4 on a.m. lab work likely caused by upper GI bleed. -He reported generalized weakness along with syncope event at home. I suspect symptoms related to blood loss. In setting of symptomatic anemia he'll be typed and crossed and transfused with 2 units of packed red blood cells today.  3.  Acute kidney injury. -Likely secondary to hypovolemia insetting of GI bleed. Initial lab work revealed a creatinine of 1.37 with BUN of 33. On this mornings labs pending, down to 1.19 with BUN of 24 with volume replacement.  4.  History of pulmonary embolism. -Due to presence of GI bleed anticoagulation has been held.  5.  History of diffuse large cell lymphoma -Patient currently receiving treatment at the cancer center where he is followed by Dr Larey Seat of medical oncology. -He has stage IV with involvement of bone marrow. -Status post treatment with R-CHOP which tolerance was an issue. Patient transition to palliative management with single agent Rituxan   6.  Probable demand ischemia. -Lab work  revealing troponin of 0.08 which has remained stable after checking for sats. He denies chest pain. Suspect secondary to demand ischemia from hypovolemia.  7.  Chronic diastolic congestive heart failure. -His last transthoracic echocardiogram was performed on 05/20/2015 that showed preserved ejection fraction of 65-70%. Due to hypovolemia is diuretic was held on admission. -He does not have clinical evidence of volume overload, examination today.  8.  Chronic atrial fibrillation. -As mentioned above anticoagulation discontinued due to presence of GI bleed. -Ventricular rates in the 80s to low 100's -Plan to continue digoxin and Cardizem  9.  Syncope -Patient reporting dizziness prior to event. I suspect syncope likely was related to hypovolemia in setting of GI bleed. -Patient receiving blood transfusion today -He is on continuous cardiac monitoring  Code Status: DO NOT RESUSCITATE Family Communication: Family not present Disposition Plan: Plan for patient to undergo upper EGD today.   Consultants:  GI  Procedures:  Plan EGD for 11/29/2015   HPI/Subjective: [name] is a pleasant 81 year old gentleman with past medical history of diffuse large cell lymphoma, who is currently being followed at the cancer center, status post treatment with single agent rituximab after intolerance to R-CHOP, admitted to the medicine service on 11/28/2015. He was in with complaints of dark stools mixed with bright red blood per rectum over the course of the week, then had a syncopal of yesterday in the bathroom after standing up. He denied hematemesis, chest pain, shortness of breath. Initial lab work showing hemoglobin of 10.4 that trended down to 8.4 on this morning's lab work. He is chronically anticoagulated with Xarelto for history of A. fib and pulmonary embolism.  Objective: Filed Vitals:  11/29/15 1125 11/29/15 1130  BP: 114/86 110/68  Pulse: 64 103  Temp:    Resp: 16 14     Intake/Output Summary (Last 24 hours) at 11/29/15 1140 Last data filed at 11/29/15 0900  Gross per 24 hour  Intake 1204.17 ml  Output    300 ml  Net 904.17 ml   Filed Weights   11/29/15 0505  Weight: 73.755 kg (162 lb 9.6 oz)    Exam:   General:  Patient is awake and alert, nontoxic appearing, reporting diffuse abdominal pain  Cardiovascular: Irregular rate and rhythm normal S1-S2 no murmurs or gallops  Respiratory: Lungs are clear to auscultation bilaterally, normal respiratory effort, no wheezing rhonchi or rales  Abdomen: He has mild generalized pain to palpation, no rebound tenderness guarding peritoneal signs evident.  Musculoskeletal: No edema  Data Reviewed: Basic Metabolic Panel:  Recent Labs Lab 11/28/15 1358 11/29/15 0920  NA 138 138  K 3.6 3.4*  CL 97* 99*  CO2 33* 30  GLUCOSE 116* 101*  BUN 33* 24*  CREATININE 1.37* 1.19  CALCIUM 9.2 8.5*   Liver Function Tests:  Recent Labs Lab 11/28/15 1358 11/29/15 0920  AST 21 18  ALT 11* 10*  ALKPHOS 58 49  BILITOT 1.0 1.1  PROT 6.3* 5.2*  ALBUMIN 3.1* 2.6*    Recent Labs Lab 11/28/15 1358  LIPASE 20   No results for input(s): AMMONIA in the last 168 hours. CBC:  Recent Labs Lab 11/28/15 1358 11/29/15 0025 11/29/15 0920  WBC 6.8 5.2 5.2  NEUTROABS 4.6  --   --   HGB 10.4* 8.5* 8.4*  HCT 30.7* 25.1* 25.2*  MCV 93.6 93.7 95.1  PLT 113* 91* 84*   Cardiac Enzymes:  Recent Labs Lab 11/28/15 1358 11/28/15 1835 11/29/15 0025 11/29/15 0920  TROPONINI 0.08* 0.08* 0.09* 0.08*   BNP (last 3 results)  Recent Labs  09/13/15 0650 09/29/15 1222 11/28/15 1357  BNP 595.0* 226.0* 144.0*    ProBNP (last 3 results) No results for input(s): PROBNP in the last 8760 hours.  CBG: No results for input(s): GLUCAP in the last 168 hours.  Recent Results (from the past 240 hour(s))  MRSA PCR Screening     Status: Abnormal   Collection Time: 11/28/15 10:28 PM  Result Value Ref Range  Status   MRSA by PCR POSITIVE (A) NEGATIVE Final    Comment:        The GeneXpert MRSA Assay (FDA approved for NASAL specimens only), is one component of a comprehensive MRSA colonization surveillance program. It is not intended to diagnose MRSA infection nor to guide or monitor treatment for MRSA infections. RESULT CALLED TO, READ BACK BY AND VERIFIED WITH:  THOMAS,K @ 0200 ON 11/29/15 BY WOODIE,J      Studies: Ct Head Wo Contrast  11/28/2015  CLINICAL DATA:  Syncopal episodes over the last several days when standing. Generalized weakness. Fatigue and decreased appetite. Fall. The patient takes Xarelto. Initial encounter. EXAM: CT HEAD WITHOUT CONTRAST TECHNIQUE: Contiguous axial images were obtained from the base of the skull through the vertex without intravenous contrast. COMPARISON:  CT head without contrast 10/18/2015 FINDINGS: Remote lacunar infarcts of the left basal ganglia are stable. No acute cortical infarct, hemorrhage, or mass lesion is present. The basal ganglia are otherwise intact. The insular ribbon is intact bilaterally. No focal cortical defects are evident. Mild generalized atrophy is present. Ventricles are proportionate to the degree of atrophy. The paranasal sinuses and mastoid air cells are clear. The  calvarium is intact. No focal extracranial soft tissue lesions are present. Bilateral lens replacements are noted. The globes and orbits are otherwise intact. IMPRESSION: 1. No acute intracranial abnormality or significant interval change. 2. Stable atrophy and white matter disease. 3. 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 Electronically Signed   By: San Morelle M.D.   On: 11/28/2015 17:15   Dg Chest Port 1 View  11/28/2015  CLINICAL DATA:  Dyspnea.  History of asthma EXAM: PORTABLE CHEST 1 VIEW COMPARISON:  10/18/2015 FINDINGS: Right pleural effusion seen previously has resolved. There is chronic hyperinflation and interstitial coarsening consistent  with COPD. There is no edema, consolidation, effusion, or pneumothorax. Left subclavian porta catheter, tip at the SVC origin. Stable heart size and aortic contours. IMPRESSION: COPD without acute superimposed finding. Electronically Signed   By: Monte Fantasia M.D.   On: 11/28/2015 14:28    Scheduled Meds: . [MAR Hold] allopurinol  300 mg Oral Daily  . [MAR Hold] antiseptic oral rinse  7 mL Mouth Rinse BID  . [MAR Hold] Chlorhexidine Gluconate Cloth  6 each Topical Q0600  . [MAR Hold] digoxin  125 mcg Oral Daily  . [MAR Hold] diltiazem  30 mg Oral 4 times per day  . [MAR Hold] fluticasone  2 spray Each Nare Daily  . [MAR Hold] folic acid  1 mg Oral Daily  . [MAR Hold] gabapentin  300 mg Oral BID  . [MAR Hold] levETIRAcetam  500 mg Oral BID  . meperidine      . midazolam      . [MAR Hold] mupirocin ointment  1 application Nasal BID  . [MAR Hold] pantoprazole (PROTONIX) IV  40 mg Intravenous Q12H  . [MAR Hold] senna  2 tablet Oral BID  . [MAR Hold] tamsulosin  0.4 mg Oral QHS   Continuous Infusions: . sodium chloride    . 0.9 % NaCl with KCl 20 mEq / L 125 mL/hr at 11/28/15 2013    Principal Problem:   Syncope and collapse Active Problems:   Chronic atrial fibrillation (HCC)   Chronic diastolic heart failure (HCC)   Thrombocytopenia (HCC)   Diffuse large B cell lymphoma (HCC)   Chronic respiratory failure with hypoxia (HCC)   AKI (acute kidney injury) ([place])   Dehydration   History of pulmonary embolism   Elevated troponin I level   Normocytic anemia   GI bleed   Epigastric abdominal pain    Time spent: 35 min    Lesieli Bresee, [facility] Pager [phone]. If 7PM-7AM, please contact night-coverage at [email or web address], password [facility] 11/29/2015, 11:40 AM  LOS: 1 day

## 2015-11-29 NOTE — Op Note (Signed)
EGD PROCEDURE REPORT  PATIENT:  [name]  MR#:  [id number] Birthdate:  August 30, 1931, 81 y.o., male Endoscopist:  Dr. Rogene Houston, MD Referred By:  Dr.Ezequiel Coralyn Pear, MD  Procedure Date: 11/29/2015  Procedure:   EGD with endoscopic placement of given capsule into stomach.  Indications:  Patient is 81 year old Caucasian male with multiple medical problems who presents with a few day history of melena and has anemia. Patient is chronically anticoagulated for atrial fibrillation and history of pulmonary embolism.            Informed Consent:  The risks, benefits, alternatives & imponderables which include, but are not limited to, bleeding, infection, perforation, drug reaction and potential missed lesion have been reviewed.  The potential for biopsy, lesion removal, esophageal dilation, etc. have also been discussed.  Questions have been answered.  All parties agreeable.  Please see history & physical in medical record for more information.  Medications:  Demerol 25 mg IV Versed 2 mg IV Cetacaine spray topically for oropharyngeal anesthesia  Description of procedure:  The endoscope was introduced through the mouth and advanced to the second portion of the duodenum without difficulty or limitations. The mucosal surfaces were surveyed very carefully during advancement of the scope and upon withdrawal.  Findings:  Esophagus:  Mucosa of the esophagus was normal. No varices identified. GE junction unremarkable. GEJ:  35 cm Hiatus:  40 cm. Focal erythema noted to mucosa of the herniated part of the stomach. Stomach:  Stomach was empty other than small amount of bile in it. It distended very well with insufflation. Folds in the proximal stomach were normal. Examination mucosa gastric body was normal. Focal prepyloric erythema noted without erosions ulcers or other abnormalities. Pyloric channel was patent. Angularis fundus and cardia were unremarkable. Duodenum:  Normal bulbar and post bulbar  mucosa.  Therapeutic/Diagnostic Maneuvers Performed:   Given capsule was drop into gastric antrum. Not able to advance it into duedenal bulb.  Complications:  None  EBL: None  Impression: Small to moderate-sized sliding hiatal hernia. Focal erythema noted to mucosa of the herniated part of the stomach and antrum. No bleeding lesions identified in upper GI tract. Given capsule dropped into the stomach.  Recommendations:  Metoclopramide 10 mg IV 1 Clear liquids and 2 hours. Half a meal and 4 hours and heart healthy diet in 8 hours. Will review given capsule study later today.  REHMAN,NAJEEB U  11/29/2015  11:53 AM  CC: Dr. [name], MD & Dr. Rayne Du ref. provider found

## 2015-11-29 NOTE — Op Note (Signed)
Small Bowel Givens Capsule Study Procedure date:  11/29/2015  Referring Provider:  Lorenso Quarry  PCP:  Dr. [name], MD  Indication for procedure:   Patient is 81 year old Caucasian male with multiple medical problems including history of atrial fibrillation and pulmonary embolism who is anticoagulated and now presents with melena and anemia. He underwent esophagogastroduodenoscopy this morning and no bleeding lesion was found. Therefore given capsule was placed endoscopically into the stomach.  Findings:   Given capsule was placed in the stomach endoscopically. Few punctate telangiectasia noted at antrum without stigmata of bleeding. Three punctate jejunal telangiectasia noted without stigmata of bleed(best seen on frames at 1:44:28, 1:44:38 and 2:05:54)  First Gastric image:  2 min and 8 sec;  Given capsule was placed endoscopically into stomach First Duodenal image: 1 hr  5 min and 2 sec First Ileo-Cecal Valve image: 6 hrs  6 min and 20 sec First Cecal image: 6 hrs 6 min and 33 sec Gastric Passage time: 1hr and 3 min Small Bowel Passage time:  5 hr and 1 min  Summary & Recommendations: Scattered punctate antral telangiectasia without stigmata of bleed. Three small punctate jejunal telangiectasia without stigmata of bleed. Patient may not have bled from these lesions. Will discuss with Dr. Gala Romney whether he should undergo colonoscopy.

## 2015-11-29 NOTE — Consult Note (Addendum)
Referring Provider: No ref. provider found Primary Care Physician:  [name], MD Primary Gastroenterologist:  Cristopher Estimable. Rourk, MD   Reason for Consultation:    Melena and anemia.  HPI:  Patient is 81 year old Caucasian male with multiple medical problems who is chronically anticoagulated for history of atrial fibrillation and coronary embolism and also undergoing palliative therapy for diffuse large B cell lymphoma has been experiencing melena for the last 4-5 days but did not tell his daughter. He lives with his daughter [name]. Yesterday on when he got up to go to the bathroom and passed out. This occurred while he was having a bowel movement. His stool was noted to be black. He was therefore brought to emergency room and was subsequently admitted to hospitalist service. He was noted to be anemic with hemoglobin of 10.4 g. Rectal examination revealed tarry stool. Patient was begun on IV pantoprazole and typed and crossmatched. Hemoglobin this morning was 8.4 g. He has not had any more bowel movement. He has history of GERD and large hiatal hernia(EGD 02/02/2012) and is on omeprazole. He rarely has heartburn. He denies nausea vomiting or dysphagia. He complains of generalized abdominal pain. He states he's had this pain for 7 weeks. He has history of constipation and takes laxative. He he does not have a good appetite. He believes he has lost 30 pounds since he was diagnosed with lymphoma. He received 1 cycle of R-mini-CHOP which she did not tolerate well. He was begun on Rituxan for palliative management. He has history of colonic polyps. Last colonoscopy was in January 2014 by Dr. Gala Romney. His prep was suboptimal and a follow-up exam was advised. He was scheduled for an exam about 2 months ago that was canceled because of ongoing problems with lymphoma. Patient does not take aspirin or other NSAIDs. He states he used to drink too much alcohol but hasn't had any in over 20 is according to  his daughter. However he keeps bottle of vodka in the trunk of his car. He quit cigarette smoking 20 years ago after having smoked for over 40 years about a pack a day. He is a retired [employer] manager. He is widowed. His wife died 2 years ago. He lives with his daughter [name]. He has 4 daughters and 2 sons.   Past Medical History  Diagnosis Date  . COPD (chronic obstructive pulmonary disease) (HCC)     Uses occasional nighttime O2  . Disseminated herpes zoster 2010  . GERD (gastroesophageal reflux disease)   . Asthma   . Pulmonary embolus ([place])     2003 and 2011  . Pulmonary nodules     Chest CT 09/11  . Mixed hyperlipidemia   . Chronic atrial fibrillation ([place])   . Lupus anticoagulant positive   . Essential hypertension, benign   . Cholelithiasis status post surgery in May 2013    . Rheumatoid arthritis(714.0)   . History of chicken pox 1941; 2011  . Anemia   . Chronic lower back pain   . Chronic diastolic heart failure ([place])   . Cirrhosis ([place]) etiology presumed to be due to alcohol given several year history of excessive alcohol use. Hepatitis B surface antigen negative      Questionable, AFP normal Feb 01/2012, hx of ETOH use   . Headache(784.0)   . Cervical spondylosis without myelopathy   . Stage III chronic kidney disease   . Cellulitis of leg, left 01/03/2015  . Aortic stenosis, moderate 05/21/2015  . Dysrhythmia  chronic AFib  . Diffuse large B cell lymphoma ([place]) 08/22/2015  . DNR (do not resuscitate) 11/01/2015  . Seizure disorder ([place]) 09/2015    Past Surgical History  Procedure Laterality Date  . Polypectomy  02/02/2012    RMR: Multiple colonic polyps removed, flat, tubular adenomas/ Left-sided diverticulosis  . Posterior lumbar vertebrae excision  2003; 2009; 2011  . Myringotomy      "3 times; both ears"  . Cataract extraction w/ intraocular lens  implant, bilateral    . Cholecystectomy  05/23/2012    Procedure: LAPAROSCOPIC CHOLECYSTECTOMY WITH INTRAOPERATIVE  CHOLANGIOGRAM;  Surgeon: Stark Klein, MD;  Location: [facility];  Service: General;  Laterality: N/A;  . Esophagogastroduodenoscopy  02/02/12    [id number] size hiatal hernia; otherwise normal exam  . Colonoscopy  01/24/2013    [id number] polyps/colonic diverticulosis  . Back surgery    . Portacath placement    . Lymph node biopsy    . Lymph node biopsy Right 09/11/2015    Procedure: RIGHT INGUINAL LYMPH NODE BIOPSY;  Surgeon: Aviva Signs, MD;  Location: [facility];  Service: General;  Laterality: Right;  . Portacath placement Left 09/11/2015    Procedure: INSERTION PORT-A-CATH;  Surgeon: Aviva Signs, MD;  Location: [facility];  Service: General;  Laterality: Left;    Prior to Admission medications   Medication Sig Start Date End Date Taking? Authorizing Provider  albuterol (PROAIR HFA) 108 (90 BASE) MCG/ACT inhaler Inhale 2 puffs into the lungs every 6 (six) hours as needed. Shortness of Breath 08/25/15  Yes Radene Gunning, NP  allopurinol (ZYLOPRIM) 300 MG tablet Take 1 tablet (300 mg total) by mouth daily. 09/18/15  Yes Kathie Dike, MD  cetirizine (ZYRTEC) 10 MG tablet Take 10 mg by mouth daily.     Yes Historical Provider, MD  [place] 125 MCG tablet TAKE ONE TABLET BY MOUTH DAILY 09/28/15  Yes Satira Sark, MD  diltiazem (CARDIZEM CD) 240 MG 24 hr capsule Take 1 capsule (240 mg total) by mouth daily. 10/02/15  Yes Kathie Dike, MD  diphenhydrAMINE (BENADRYL) 25 MG tablet Take 25 mg by mouth every 6 (six) hours as needed for itching or allergies.   Yes Historical Provider, MD  ferrous sulfate 325 (65 FE) MG tablet Take 325 mg by mouth daily.   Yes Historical Provider, MD  fluticasone (FLONASE) 50 MCG/ACT nasal spray Place 2 sprays into both nostrils daily.  08/25/15  Yes Historical Provider, MD  folic acid (FOLVITE) 1 MG tablet Take 1 mg by mouth daily.    Yes Historical Provider, MD  furosemide (LASIX) 40 MG tablet Take 1 tablet (40 mg total) by mouth daily. Take daily for prevention of fluid  buildup. 10/02/15  Yes Kathie Dike, MD  gabapentin (NEURONTIN) 300 MG capsule Take 2 capsules (600 mg total) by mouth 2 (two) times daily. 08/25/15  Yes Lezlie Octave Black, NP  ipratropium-albuterol (DUONEB) 0.5-2.5 (3) MG/3ML SOLN Take 3 mLs by nebulization every 6 (six) hours as needed. Patient taking differently: Take 3 mLs by nebulization every 6 (six) hours as needed (shortness of breath).  09/18/15  Yes Kathie Dike, MD  ketoconazole (NIZORAL) 2 % shampoo Apply 1 application topically 3 (three) times a week.  05/21/13  Yes Historical Provider, MD  levETIRAcetam (KEPPRA) 500 MG tablet Take 1 tablet (500 mg total) by mouth 2 (two) times daily. 10/21/15  Yes Samuella Cota, MD  Omega-3 Fatty Acids (FISH OIL) 1000 MG CAPS Take 2 capsules by mouth 2 (two) times daily.  Yes Historical Provider, MD  omeprazole (PRILOSEC) 40 MG capsule Take 40 mg by mouth daily.   Yes Historical Provider, MD  ondansetron (ZOFRAN) 8 MG tablet Take 1 tablet every 8 hours as needed for nausea/vomiting. Patient taking differently: Take 8 mg by mouth every 8 (eight) hours as needed for nausea or vomiting.  09/16/15  Yes Baird Cancer, PA-C  Oxycodone HCl 10 MG TABS Take 1 tablet (10 mg total) by mouth every 6 (six) hours as needed (pain). 11/13/15  Yes Manon Hilding Kefalas, PA-C  potassium chloride SA (K-DUR,KLOR-CON) 20 MEQ tablet Take 0.5 tablets (10 mEq total) by mouth daily. Starting 08/27/15. 08/25/15  Yes Lezlie Octave Black, NP  predniSONE (DELTASONE) 20 MG tablet On Days 1-5 of chemo take 4.5 tablets (90mg ) daily. 10/05/15  Yes Baird Cancer, PA-C  prochlorperazine (COMPAZINE) 10 MG tablet Take 1 tablet (10 mg total) by mouth every 6 (six) hours as needed (Nausea or vomiting). Patient taking differently: Take 10 mg by mouth every 6 (six) hours as needed (Nausea or vomiting). nausea 09/16/15  Yes Baird Cancer, PA-C  Rivaroxaban (XARELTO STARTER PACK) 15 & 20 MG TBPK Take as directed on package: Start with one 15mg  tablet  by mouth twice a day with food. On Day 22, switch to one 20mg  tablet once a day with food. 10/05/15  Yes Baird Cancer, PA-C  Sennosides-Docusate Sodium (SENOKOT S PO) Take 1 tablet by mouth daily.    Yes Historical Provider, MD  tadalafil (CIALIS) 5 MG tablet Take 5 mg by mouth daily as needed for erectile dysfunction.  05/18/15  Yes Historical Provider, MD  tamsulosin (FLOMAX) 0.4 MG CAPS capsule Take 1 capsule (0.4 mg total) by mouth at bedtime. For prostate treatment. 05/25/15  Yes Rexene Alberts, MD  Vitamin D, Ergocalciferol, (DRISDOL) 50000 UNITS CAPS capsule Take 1 capsule by mouth once a week. friday 04/27/15  Yes Historical Provider, MD  lidocaine-prilocaine (EMLA) cream Apply a quarter size amount to port site 1 hour prior to chemo. Do not rub in. Cover with plastic wrap. 09/17/15   Patrici Ranks, MD    Current Facility-Administered Medications  Medication Dose Route Frequency Provider Last Rate Last Dose  . 0.9 % NaCl with KCl 20 mEq/ L  infusion   Intravenous Continuous Rexene Alberts, MD 125 mL/hr at 11/28/15 2013    . acetaminophen (TYLENOL) tablet 650 mg  650 mg Oral Q6H PRN Rexene Alberts, MD       Or  . acetaminophen (TYLENOL) suppository 650 mg  650 mg Rectal Q6H PRN Rexene Alberts, MD      . allopurinol (ZYLOPRIM) tablet 300 mg  300 mg Oral Daily Rexene Alberts, MD   300 mg at 11/29/15 0901  . alum & mag hydroxide-simeth (MAALOX/MYLANTA) 200-200-20 MG/5ML suspension 30 mL  30 mL Oral Q6H PRN Rexene Alberts, MD      . antiseptic oral rinse (CPC / CETYLPYRIDINIUM CHLORIDE 0.05%) solution 7 mL  7 mL Mouth Rinse BID Rexene Alberts, MD   7 mL at 11/29/15 1000  . Chlorhexidine Gluconate Cloth 2 % PADS 6 each  6 each Topical Q0600 Rexene Alberts, MD   6 each at 11/29/15 [phone]  . digoxin (LANOXIN) tablet 125 mcg  125 mcg Oral Daily Rexene Alberts, MD   125 mcg at 11/29/15 0901  . diltiazem (CARDIZEM) tablet 30 mg  30 mg Oral 4 times per day Rexene Alberts, MD   30 mg at 11/29/15 0546  .  diphenhydrAMINE (BENADRYL)  12.5 MG/5ML elixir 12.5 mg  12.5 mg Oral Q8H PRN Kelvin Cellar, MD      . fluticasone (FLONASE) 50 MCG/ACT nasal spray 2 spray  2 spray Each Nare Daily Rexene Alberts, MD   2 spray at 11/29/15 0901  . folic acid (FOLVITE) tablet 1 mg  1 mg Oral Daily Rexene Alberts, MD   1 mg at 11/29/15 0901  . gabapentin (NEURONTIN) capsule 300 mg  300 mg Oral BID Rexene Alberts, MD   300 mg at 11/29/15 0901  . guaiFENesin-dextromethorphan (ROBITUSSIN DM) 100-10 MG/5ML syrup 5 mL  5 mL Oral Q4H PRN Rexene Alberts, MD      . ipratropium-albuterol (DUONEB) 0.5-2.5 (3) MG/3ML nebulizer solution 3 mL  3 mL Nebulization Q6H PRN Rexene Alberts, MD      . levETIRAcetam (KEPPRA) tablet 500 mg  500 mg Oral BID Rexene Alberts, MD   500 mg at 11/29/15 0901  . morphine 2 MG/ML injection 2 mg  2 mg Intravenous Q2H PRN Rexene Alberts, MD   2 mg at 11/29/15 0901  . mupirocin ointment (BACTROBAN) 2 % 1 application  1 application Nasal BID Rexene Alberts, MD   1 application at [id number] [phone]  . ondansetron (ZOFRAN) tablet 4 mg  4 mg Oral Q6H PRN Rexene Alberts, MD       Or  . ondansetron [employer]) injection 4 mg  4 mg Intravenous Q6H PRN Rexene Alberts, MD      . pantoprazole (PROTONIX) injection 40 mg  40 mg Intravenous Q12H Rexene Alberts, MD   40 mg at 11/29/15 0855  . senna (SENOKOT) tablet 17.2 mg  2 tablet Oral BID Rexene Alberts, MD   17.2 mg at 11/29/15 0901  . tamsulosin (FLOMAX) capsule 0.4 mg  0.4 mg Oral QHS Rexene Alberts, MD   0.4 mg at 11/28/15 2009    Allergies as of 11/28/2015 - Review Complete 11/28/2015  Allergen Reaction Noted  . Cymbalta [duloxetine hcl] Other (See Comments) 05/21/2012  . Procaine hcl Hives and Other (See Comments)   . Bystolic [nebivolol hcl] Other (See Comments) 07/16/2013    Family History  Problem Relation Age of Onset  . Colon cancer Neg Hx   . Liver disease Neg Hx   . Diabetes Mother     Social History   Social History  . Marital Status: Widowed    Spouse  Name: N/A  . Number of Children: 6  . Years of Education: 7th   Occupational History  . retired   . RETIRED    Social History Main Topics  . Smoking status: Former Smoker -- 1.00 packs/day for 57 years    Types: Cigarettes    Quit date: 01/27/1998  . Smokeless tobacco: Never Used  . Alcohol Use: No     Comment: "quit drinking 1976"  . Drug Use: No  . Sexual Activity: Not Currently   Other Topics Concern  . Not on file   Social History Narrative    Review of Systems: See HPI, otherwise normal ROS  Physical Exam: Temp:  [97.8 F (36.6 C)-98.6 F (37 C)] 98 F (36.7 C) (12/04 0500) Pulse Rate:  [71-102] 93 (12/04 0500) Resp:  [11-21] 20 (12/04 0500) BP: (91-121)/(50-99) 110/84 mmHg (12/04 0510) SpO2:  [98 %-100 %] 99 % (12/04 0500) Weight:  [162 lb 9.6 oz (73.755 kg)] 162 lb 9.6 oz (73.755 kg) (12/04 0505) Last BM Date: 11/28/15 Patient is alert and in no acute distress. Conjunctiva is pale. Sclerae nonicteric. Oropharyngeal mucosa is  dry otherwise normal. He is edentulous. No neck masses or thyromegaly noted. He has left-sided Port-A-Cath. Cardiac exam with irregular rhythm normal S1 and S2. Faint systolic ejection murmur heard at aortic area. Auscultation of lungs revealed visit your breath sounds with diminished intensity throughout. Abdomen is symmetrical. Bowel sounds are normal. No bruits noted. On palpation he has mild to moderate tenderness in epigastric region and mild tenderness and rest of abdomen but without guarding. No organomegaly or masses. He has fullness in right groin with well-healed scar. There is mild tenderness. Rectal examination deferred as he had one by Dr. Caryn Section revealing tarry stool. Extremities thin no clubbing noted.  Lab Results:  Recent Labs  11/28/15 1358 11/29/15 0025 11/29/15 0920  WBC 6.8 5.2 5.2  HGB 10.4* 8.5* 8.4*  HCT 30.7* 25.1* 25.2*  PLT 113* 91* PENDING   BMET  Recent Labs  11/28/15 1358  NA 138  K 3.6  CL  97*  CO2 33*  GLUCOSE 116*  BUN 33*  CREATININE 1.37*  CALCIUM 9.2   LFT  Recent Labs  11/28/15 1358  PROT 6.3*  ALBUMIN 3.1*  AST 21  ALT 11*  ALKPHOS 58  BILITOT 1.0    Studies/Results: Ct Head Wo Contrast  11/28/2015  CLINICAL DATA:  Syncopal episodes over the last several days when standing. Generalized weakness. Fatigue and decreased appetite. Fall. The patient takes Xarelto. Initial encounter. EXAM: CT HEAD WITHOUT CONTRAST TECHNIQUE: Contiguous axial images were obtained from the base of the skull through the vertex without intravenous contrast. COMPARISON:  CT head without contrast 10/18/2015 FINDINGS: Remote lacunar infarcts of the left basal ganglia are stable. No acute cortical infarct, hemorrhage, or mass lesion is present. The basal ganglia are otherwise intact. The insular ribbon is intact bilaterally. No focal cortical defects are evident. Mild generalized atrophy is present. Ventricles are proportionate to the degree of atrophy. The paranasal sinuses and mastoid air cells are clear. The calvarium is intact. No focal extracranial soft tissue lesions are present. Bilateral lens replacements are noted. The globes and orbits are otherwise intact. IMPRESSION: 1. No acute intracranial abnormality or significant interval change. 2. Stable atrophy and white matter disease. 3. 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 Electronically Signed   By: San Morelle M.D.   On: 11/28/2015 17:15   Dg Chest Port 1 View  11/28/2015  CLINICAL DATA:  Dyspnea.  History of asthma EXAM: PORTABLE CHEST 1 VIEW COMPARISON:  10/18/2015 FINDINGS: Right pleural effusion seen previously has resolved. There is chronic hyperinflation and interstitial coarsening consistent with COPD. There is no edema, consolidation, effusion, or pneumothorax. Left subclavian porta catheter, tip at the SVC origin. Stable heart size and aortic contours. IMPRESSION: COPD without acute superimposed finding.  Electronically Signed   By: Monte Fantasia M.D.   On: 11/28/2015 14:28    Assessment; Patient is 81 year old Caucasian male with multiple medical problems including chronic GERD with large hiatal hernia, on palliative therapy for for stage IV diffuse large B-cell lymphoma, anticoagulated for atrial fibrillation and history of pulmonary embolism who presents with 4-5 day history of melena and anemia. He has syncopal episode yesterday after which he was brought to emergency room for evaluation. Hemoglobin has dropped by 2 g down to 8.4 g. Upper GI bleed needs to be ruled out before considering further evaluation. He is at risk for GERD and peptic ulcer disease. Given history of aortic stenosis he could also have GI angiodysplasia.  Mild thrombocytopenia which is new. He has  underlying cirrhosis presumed to be due to prior excessive alcohol use.  He has mildly elevated troponin levels felt to be due to anemia rather than myocardial injury.  Diffuse abdominal pain. Reportedly he has had this pain off and on for a few months. Abdominopelvic CT without contrast 08/25/2015 reveal any abnormality to account for his pain. He may need further workup if abdominal pain persists.    Recommendations; Will check INR and HCV antibody with next blood draw. Diagnostic esophagogastroduodenoscopy to be performed later this morning. If EGD is nondiagnostic will proceed with endoscopic placement of given capsule for small bowel study. Patient is agreeable to proceed with evaluation. Also talked with his daughter  Ms.[name] over the phone and she is also agreeable.     LOS: 1 day   REHMAN,NAJEEB U  11/29/2015, 9:51 AM

## 2015-11-29 NOTE — OR Nursing (Signed)
Givens capsule dropped at 1145, light clear liquid snack at 1345-1400, and light snack half a sandwich and soup at 1545-1600.  Please call AC at 2000 and doc machine in endo and call Dr. Laural Golden so that he can read the scan

## 2015-11-30 DIAGNOSIS — K921 Melena: Principal | ICD-10-CM

## 2015-11-30 LAB — TYPE AND SCREEN
ABO/RH(D): O NEG
Antibody Screen: NEGATIVE
Unit division: 0
Unit division: 0

## 2015-11-30 MED ORDER — PEG 3350-KCL-NA BICARB-NACL 420 G PO SOLR
2000.0000 mL | Freq: Once | ORAL | Status: AC
  Administered 2015-12-01: 2000 mL via ORAL

## 2015-11-30 MED ORDER — OXYCODONE HCL 5 MG PO TABS
10.0000 mg | ORAL_TABLET | Freq: Four times a day (QID) | ORAL | Status: DC | PRN
  Administered 2015-11-30 – 2015-12-03 (×9): 10 mg via ORAL
  Filled 2015-11-30 (×9): qty 2

## 2015-11-30 MED ORDER — METHOCARBAMOL 1000 MG/10ML IJ SOLN: 500.0000 mg | Freq: Three times a day (TID) | INTRAMUSCULAR | Status: DC | PRN

## 2015-11-30 MED ORDER — PEG 3350-KCL-NA BICARB-NACL 420 G PO SOLR
2000.0000 mL | Freq: Once | ORAL | Status: AC
  Administered 2015-11-30: 2000 mL via ORAL
  Filled 2015-11-30 (×2): qty 4000

## 2015-11-30 MED ORDER — SODIUM CHLORIDE 0.9 % IV BOLUS (SEPSIS)
250.0000 mL | Freq: Once | INTRAVENOUS | Status: AC
  Administered 2015-11-30: 250 mL via INTRAVENOUS

## 2015-11-30 MED ORDER — DILTIAZEM HCL 30 MG PO TABS
30.0000 mg | ORAL_TABLET | Freq: Four times a day (QID) | ORAL | Status: DC
  Administered 2015-11-30 – 2015-12-01 (×4): 30 mg via ORAL
  Filled 2015-11-30 (×4): qty 1

## 2015-11-30 MED ORDER — SODIUM CHLORIDE 0.9 % IV SOLN
INTRAVENOUS | Status: DC
  Administered 2015-11-30: 23:00:00 via INTRAVENOUS

## 2015-11-30 NOTE — Progress Notes (Signed)
PT Cancellation Note  Patient Details Name: [name] MRN: [id number] DOB: 06-May-1931   Cancelled Treatment:    Reason Eval/Treat Not Completed: Pain limiting ability to participate.  Pt c/o 7/10 back pain.  He states that he is no longer getting Percocet for pain and this is what helps him the most.  He states that the Morphine is not helping at all.  I will try again tomorrow to work with him.   Demetrios Isaacs L  PT 11/30/2015, 10:44 AM [phone]

## 2015-11-30 NOTE — Progress Notes (Signed)
[facility] PROGRESS NOTE  [name] [id number] DOB: Aug 30, 1931 DOA: 11/28/2015 PCP: [name], MD  Assessment/Plan: 1. Suspected upper GI bleed -[name] having a history of A. fib and pulmonary embolism for which he had been anticoagulated with Xarelto, presenting with complaints of dark stools mixed with bright red blood per rectum over the past week. -Initial lab work showed a hemoglobin of 10.4 that trended down to 8.5 , s/p 2 units  -Anticoagulation has been discontinued on admission. -GI consulted , did  upper endoscopy/capsule endoscopy which showed  Scattered punctate antral telangiectasia without stigmata of bleed.Three small punctate jejunal telangiectasia without stigmata of bleed. Patient may not have bled from these lesions per GI notes . -Continue IV Protonix May need colonoscopy in am ,  GI to recommend when to start xarelto   2.  Acute blood loss anemia Received 2 units , now 11.1 -He reported generalized weakness along with syncope event at home. I suspect symptoms related to blood loss.   3.  Acute kidney injury. -Likely secondary to hypovolemia insetting of GI bleed. Initial lab work revealed a creatinine of 1.37 with BUN of 33. On this mornings labs pending, down to 1.19 with BUN of 24 with volume replacement.  4.  History of pulmonary embolism. -Due to presence of GI bleed anticoagulation has been held.  5.  History of diffuse large cell lymphoma -Patient currently receiving treatment at the cancer center where he is followed by Dr Larey Seat of medical oncology. -He has stage IV with involvement of bone marrow. -Status post treatment with R-CHOP which tolerance was an issue. Patient transition to palliative management with single agent Rituxan   6.  Probable demand ischemia. -Lab work revealing troponin of 0.08 which has remained stable after checking for sats. He denies chest pain. Suspect secondary to demand ischemia from  hypovolemia.  7.  Chronic diastolic congestive heart failure. -His last transthoracic echocardiogram was performed on 05/20/2015 that showed preserved ejection fraction of 65-70%. Due to hypovolemia is diuretic was held on admission. -He does not have clinical evidence of volume overload, examination today.  8.  Chronic atrial fibrillation. -As mentioned above anticoagulation discontinued due to presence of GI bleed. -Ventricular rates in the 80s to low 100's -Plan to continue digoxin and Cardizem  9.  Syncope -Patient reporting dizziness prior to event. I suspect syncope likely was related to hypovolemia in setting of GI bleed. Refused midodrine ,states BP always runs low in the 90's  -He is on continuous cardiac monitoring  Code Status: DO NOT RESUSCITATE Family Communication: Family not present Disposition Plan: Plan for colonoscopy in am    Consultants:  GI  Procedures:  Plan EGD for 11/29/2015   HPI  [name] is a pleasant 81 year old gentleman with past medical history of diffuse large cell lymphoma, who is currently being followed at the cancer center, status post treatment with single agent rituximab after intolerance to R-CHOP, admitted to the medicine service on 11/28/2015. He was in with complaints of dark stools mixed with bright red blood per rectum over the course of the week, then had a syncopal of yesterday in the bathroom after standing up. He denied hematemesis, chest pain, shortness of breath. Initial lab work showing hemoglobin of 10.4 that trended down to 8.4 on this morning's lab work. He is chronically anticoagulated with Xarelto for history of A. fib and pulmonary embolism.  Objective: Filed Vitals:   11/30/15 0415 11/30/15 0814  BP: 100/53 99/55  Pulse: 82 70  Temp: 97.3 F (36.3 C)   Resp: 18     Intake/Output Summary (Last 24 hours) at 11/30/15 0944 Last data filed at 11/29/15 2000  Gross per 24 hour  Intake 2168.75 ml  Output      0 ml  Net  2168.75 ml   Filed Weights   11/29/15 0505 11/30/15 0652  Weight: 73.755 kg (162 lb 9.6 oz) 77.021 kg (169 lb 12.8 oz)    Exam:   General:  Patient is awake and alert, nontoxic appearing, reporting diffuse abdominal pain  Cardiovascular: Irregular rate and rhythm normal S1-S2 no murmurs or gallops  Respiratory: Lungs are clear to auscultation bilaterally, normal respiratory effort, no wheezing rhonchi or rales  Abdomen: He has mild generalized pain to palpation, no rebound tenderness guarding peritoneal signs evident.  Musculoskeletal: No edema  Data Reviewed: Basic Metabolic Panel:  Recent Labs Lab 11/28/15 1358 11/29/15 0920  NA 138 138  [name] 3.6 3.4*  CL 97* 99*  CO2 33* 30  GLUCOSE 116* 101*  BUN 33* 24*  CREATININE 1.37* 1.19  CALCIUM 9.2 8.5*   Liver Function Tests:  Recent Labs Lab 11/28/15 1358 11/29/15 0920  AST 21 18  ALT 11* 10*  ALKPHOS 58 49  BILITOT 1.0 1.1  PROT 6.3* 5.2*  ALBUMIN 3.1* 2.6*    Recent Labs Lab 11/28/15 1358  LIPASE 20   No results for input(s): AMMONIA in the last 168 hours. CBC:  Recent Labs Lab 11/28/15 1358 11/29/15 0025 11/29/15 0920 11/29/15 2214  WBC 6.8 5.2 5.2 6.9  NEUTROABS 4.6  --   --   --   HGB 10.4* 8.5* 8.4* 11.1*  HCT 30.7* 25.1* 25.2* 33.0*  MCV 93.6 93.7 95.1 92.7  PLT 113* 91* 84* 101*   Cardiac Enzymes:  Recent Labs Lab 11/28/15 1358 11/28/15 1835 11/29/15 0025 11/29/15 0920  TROPONINI 0.08* 0.08* 0.09* 0.08*   BNP (last 3 results)  Recent Labs  09/13/15 0650 09/29/15 1222 11/28/15 1357  BNP 595.0* 226.0* 144.0*    ProBNP (last 3 results) No results for input(s): PROBNP in the last 8760 hours.  CBG: No results for input(s): GLUCAP in the last 168 hours.  Recent Results (from the past 240 hour(s))  MRSA PCR Screening     Status: Abnormal   Collection Time: 11/28/15 10:28 PM  Result Value Ref Range Status   MRSA by PCR POSITIVE (A) NEGATIVE Final    Comment:        The  GeneXpert MRSA Assay (FDA approved for NASAL specimens only), is one component of a comprehensive MRSA colonization surveillance program. It is not intended to diagnose MRSA infection nor to guide or monitor treatment for MRSA infections. RESULT CALLED TO, READ BACK BY AND VERIFIED WITH:  [name],[name] @ 0200 ON 11/29/15 BY WOODIE,J      Studies: Ct Head Wo Contrast  11/28/2015  CLINICAL DATA:  Syncopal episodes over the last several days when standing. Generalized weakness. Fatigue and decreased appetite. Fall. The patient takes Xarelto. Initial encounter. EXAM: CT HEAD WITHOUT CONTRAST TECHNIQUE: Contiguous axial images were obtained from the base of the skull through the vertex without intravenous contrast. COMPARISON:  CT head without contrast 10/18/2015 FINDINGS: Remote lacunar infarcts of the left basal ganglia are stable. No acute cortical infarct, hemorrhage, or mass lesion is present. The basal ganglia are otherwise intact. The insular ribbon is intact bilaterally. No focal cortical defects are evident. Mild generalized atrophy is present. Ventricles are proportionate to the degree of atrophy.  The paranasal sinuses and mastoid air cells are clear. The calvarium is intact. No focal extracranial soft tissue lesions are present. Bilateral lens replacements are noted. The globes and orbits are otherwise intact. IMPRESSION: 1. No acute intracranial abnormality or significant interval change. 2. Stable atrophy and white matter disease. 3. 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 Electronically Signed   By: San Morelle M.D.   On: 11/28/2015 17:15   Dg Chest Port 1 View  11/28/2015  CLINICAL DATA:  Dyspnea.  History of asthma EXAM: PORTABLE CHEST 1 VIEW COMPARISON:  10/18/2015 FINDINGS: Right pleural effusion seen previously has resolved. There is chronic hyperinflation and interstitial coarsening consistent with COPD. There is no edema, consolidation, effusion, or pneumothorax. Left  subclavian porta catheter, tip at the SVC origin. Stable heart size and aortic contours. IMPRESSION: COPD without acute superimposed finding. Electronically Signed   By: Monte Fantasia M.D.   On: 11/28/2015 14:28    Scheduled Meds: . sodium chloride   Intravenous Once  . allopurinol  300 mg Oral Daily  . antiseptic oral rinse  7 mL Mouth Rinse BID  . Chlorhexidine Gluconate Cloth  6 each Topical Q0600  . digoxin  125 mcg Oral Daily  . diltiazem  30 mg Oral 4 times per day  . fluticasone  2 spray Each Nare Daily  . folic acid  1 mg Oral Daily  . gabapentin  300 mg Oral BID  . levETIRAcetam  500 mg Oral BID  . mupirocin ointment  1 application Nasal BID  . pantoprazole (PROTONIX) IV  40 mg Intravenous Q12H  . senna  2 tablet Oral BID  . sodium chloride  250 mL Intravenous Once  . tamsulosin  0.4 mg Oral QHS   Continuous Infusions: . 0.9 % NaCl with KCl 20 mEq / L 125 mL/hr at 11/28/15 2013    Principal Problem:   Syncope and collapse Active Problems:   Chronic atrial fibrillation (HCC)   Chronic diastolic heart failure (HCC)   Thrombocytopenia (HCC)   Diffuse large B cell lymphoma (HCC)   Chronic respiratory failure with hypoxia (HCC)   AKI (acute kidney injury) ([place])   Dehydration   History of pulmonary embolism   Elevated troponin I level   Normocytic anemia   GI bleed   Epigastric abdominal pain    Time spent: 35 min    [facility]  [facility] Pager [phone]. If 7PM-7AM, please contact night-coverage at [email or web address], password [facility] 11/30/2015, 9:44 AM  LOS: 2 days

## 2015-11-30 NOTE — Progress Notes (Signed)
Subjective: Notes bilateral lower quadrant abdominal discomfort, worse with movement, not related to eating/drinking. Vague. Denies melena to me but hospitalist reports he had episode yesterday of melena. Patient also notes that prior to admission he had melena/bright red blood mixed.   Objective: Vital signs in last 24 hours: Temp:  [97.3 F (36.3 C)-98.4 F (36.9 C)] 97.3 F (36.3 C) (12/05 0415) Pulse Rate:  [64-103] 82 (12/05 0415) Resp:  [11-27] 18 (12/05 0415) BP: (75-141)/(39-130) 100/53 mmHg (12/05 0415) SpO2:  [97 %-100 %] 97 % (12/05 0415) Weight:  [169 lb 12.8 oz (77.021 kg)] 169 lb 12.8 oz (77.021 kg) (12/05 [id number]) Last BM Date: 11/28/15 General:   Alert and oriented, pleasant Head:  Normocephalic and atraumatic. Abdomen:  Bowel sounds present, soft, non-tender, non-distended. No HSM or hernias noted. No rebound or guarding. No masses appreciated  Msk:  Symmetrical without gross deformities. Normal posture. Neurologic:  Alert and  oriented x4 Psych:  Alert and cooperative. Normal mood and affect.  Intake/Output from previous day: 12/04 0701 - 12/05 0700 In: 2168.8 [I.V.:1118.8; Blood:1050] Out: -  Intake/Output this shift:    Lab Results:  Recent Labs  11/29/15 0025 11/29/15 0920 11/29/15 2214  WBC 5.2 5.2 6.9  HGB 8.5* 8.4* 11.1*  HCT 25.1* 25.2* 33.0*  PLT 91* 84* 101*   BMET  Recent Labs  11/28/15 1358 11/29/15 0920  NA 138 138  K 3.6 3.4*  CL 97* 99*  CO2 33* 30  GLUCOSE 116* 101*  BUN 33* 24*  CREATININE 1.37* 1.19  CALCIUM 9.2 8.5*   LFT  Recent Labs  11/28/15 1358 11/29/15 0920  PROT 6.3* 5.2*  ALBUMIN 3.1* 2.6*  AST 21 18  ALT 11* 10*  ALKPHOS 58 49  BILITOT 1.0 1.1   PT/INR  Recent Labs  11/29/15 2214  LABPROT 15.7*  INR 1.23    Studies/Results: Ct Head Wo Contrast  11/28/2015  CLINICAL DATA:  Syncopal episodes over the last several days when standing. Generalized weakness. Fatigue and decreased appetite. Fall.  The patient takes Xarelto. Initial encounter. EXAM: CT HEAD WITHOUT CONTRAST TECHNIQUE: Contiguous axial images were obtained from the base of the skull through the vertex without intravenous contrast. COMPARISON:  CT head without contrast 10/18/2015 FINDINGS: Remote lacunar infarcts of the left basal ganglia are stable. No acute cortical infarct, hemorrhage, or mass lesion is present. The basal ganglia are otherwise intact. The insular ribbon is intact bilaterally. No focal cortical defects are evident. Mild generalized atrophy is present. Ventricles are proportionate to the degree of atrophy. The paranasal sinuses and mastoid air cells are clear. The calvarium is intact. No focal extracranial soft tissue lesions are present. Bilateral lens replacements are noted. The globes and orbits are otherwise intact. IMPRESSION: 1. No acute intracranial abnormality or significant interval change. 2. Stable atrophy and white matter disease. 3. 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 Electronically Signed   By: San Morelle M.D.   On: 11/28/2015 17:15   Dg Chest Port 1 View  11/28/2015  CLINICAL DATA:  Dyspnea.  History of asthma EXAM: PORTABLE CHEST 1 VIEW COMPARISON:  10/18/2015 FINDINGS: Right pleural effusion seen previously has resolved. There is chronic hyperinflation and interstitial coarsening consistent with COPD. There is no edema, consolidation, effusion, or pneumothorax. Left subclavian porta catheter, tip at the SVC origin. Stable heart size and aortic contours. IMPRESSION: COPD without acute superimposed finding. Electronically Signed   By: Monte Fantasia M.D.   On: 11/28/2015 14:28  Assessment: 81 year old male with multiple medical issues and chronically anticoagulated for history of afib and PE, has stage IV metastatic melanoma on palliative chemotherapy, presenting with melena mixed with small amounts of bright red blood and symptomatic anemia. EGD with capsule study performed by  Dr. Laural Golden 12/4 without any source for blood loss. Capsule study with a few telangiectasias but without stigmata of bleeding. Last colonoscopy in 2014 with poor prep and multiple adenomas, with early interval colonoscopy recommended. With history of multiple colon polyps historically and last colonoscopy with poor prep, repeat lower GI evaluation will be on 12/6 with Dr. Oneida Alar.   Anemia: drop in Hgb to 8.4 during admission but improved to 11. After 2 units PRBCs. Xarelto is on hold.   Cirrhosis: likely ETOH related. No stigmata of advanced liver disease on recent EGD. CT without contrast Aug 2016 without suspicious liver lesions  Plan: Clear liquids diet today Split prep of Golytely starting this afternoon Colonoscopy with Dr. Oneida Alar on 12/01/15 (conscious sedation. EGD performed this admission with Demerol 25 and Versed 2).  Xarelto remains on hold. Continue to hold for now and will likely restart after colonoscopy pending results  Orvil Feil, ANP-BC [facility]    LOS: 2 days    11/30/2015, 8:19 AM

## 2015-12-01 ENCOUNTER — Encounter (HOSPITAL_COMMUNITY)
Admission: EM | Disposition: A | Discharge status: Home Health | Payer: Self-pay | Source: Home / Self Care | Attending: Internal Medicine

## 2015-12-01 ENCOUNTER — Inpatient Hospital Stay (HOSPITAL_COMMUNITY): Payer: Medicare Other

## 2015-12-01 ENCOUNTER — Encounter (HOSPITAL_COMMUNITY): Payer: Self-pay | Admitting: *Deleted

## 2015-12-01 ENCOUNTER — Ambulatory Visit (HOSPITAL_COMMUNITY): Payer: Medicare Other | Admitting: Hematology & Oncology

## 2015-12-01 DIAGNOSIS — K626 Ulcer of anus and rectum: Secondary | ICD-10-CM

## 2015-12-01 HISTORY — DX: Ulcer of anus and rectum: K62.6

## 2015-12-01 HISTORY — PX: COLONOSCOPY: SHX5424

## 2015-12-01 LAB — CBC
HCT: 31.5 % — ABNORMAL LOW (ref 39.0–52.0)
Hemoglobin: 10.4 g/dL — ABNORMAL LOW (ref 13.0–17.0)
MCH: 31.3 pg (ref 26.0–34.0)
MCHC: 33 g/dL (ref 30.0–36.0)
MCV: 94.9 fL (ref 78.0–100.0)
Platelets: 90 10*3/uL — ABNORMAL LOW (ref 150–400)
RBC: 3.32 MIL/uL — ABNORMAL LOW (ref 4.22–5.81)
RDW: 15.7 % — ABNORMAL HIGH (ref 11.5–15.5)
WBC: 5.3 10*3/uL (ref 4.0–10.5)

## 2015-12-01 LAB — COMPREHENSIVE METABOLIC PANEL
ALBUMIN: 2.6 g/dL — AB (ref 3.5–5.0)
ALK PHOS: 51 U/L (ref 38–126)
ALT: 11 U/L — AB (ref 17–63)
AST: 21 U/L (ref 15–41)
BILIRUBIN TOTAL: 1.1 mg/dL (ref 0.3–1.2)
BUN: 10 mg/dL (ref 6–20)
CALCIUM: 8.2 mg/dL — AB (ref 8.9–10.3)
CO2: 30 mmol/L (ref 22–32)
CREATININE: 1.01 mg/dL (ref 0.61–1.24)
Chloride: 109 mmol/L (ref 101–111)
GFR calc Af Amer: >60 mL/min (ref >60)
Glucose, Bld: 91 mg/dL (ref 65–99)
Potassium: 3.8 mmol/L (ref 3.5–5.1)
Sodium: 139 mmol/L (ref 135–145)
TOTAL PROTEIN: 5.2 g/dL — AB (ref 6.5–8.1)

## 2015-12-01 LAB — HEPATITIS C ANTIBODY

## 2015-12-01 SURGERY — COLONOSCOPY
Anesthesia: Moderate Sedation

## 2015-12-01 MED ORDER — FENTANYL CITRATE (PF) 100 MCG/2ML IJ SOLN
25.0000 ug | Freq: Once | INTRAMUSCULAR | Status: AC
  Administered 2015-12-01: 25 ug via INTRAVENOUS

## 2015-12-01 MED ORDER — FENTANYL CITRATE (PF) 100 MCG/2ML IJ SOLN
INTRAMUSCULAR | Status: DC | PRN
  Administered 2015-12-01 (×3): 25 ug via INTRAVENOUS

## 2015-12-01 MED ORDER — MEPERIDINE HCL 100 MG/ML IJ SOLN
INTRAMUSCULAR | Status: AC
  Filled 2015-12-01: qty 2

## 2015-12-01 MED ORDER — MIDAZOLAM HCL 5 MG/5ML IJ SOLN
INTRAMUSCULAR | Status: AC
  Filled 2015-12-01: qty 10

## 2015-12-01 MED ORDER — STERILE WATER FOR IRRIGATION IR SOLN
Status: DC | PRN
  Administered 2015-12-01: 2.5 mL

## 2015-12-01 MED ORDER — METHOCARBAMOL 500 MG PO TABS: 500.0000 mg | ORAL_TABLET | Freq: Three times a day (TID) | ORAL | Status: DC | PRN

## 2015-12-01 MED ORDER — OXYCODONE HCL 10 MG PO TABS: 10.0000 mg | ORAL_TABLET | Freq: Four times a day (QID) | ORAL | Status: DC | PRN

## 2015-12-01 MED ORDER — MIDAZOLAM HCL 5 MG/5ML IJ SOLN
INTRAMUSCULAR | Status: DC | PRN
  Administered 2015-12-01 (×5): 1 mg via INTRAVENOUS

## 2015-12-01 MED ORDER — METHYLPREDNISOLONE SODIUM SUCC 40 MG IJ SOLR: 40.0000 mg | Freq: Once | INTRAMUSCULAR | Status: DC

## 2015-12-01 MED ORDER — FENTANYL CITRATE (PF) 100 MCG/2ML IJ SOLN
INTRAMUSCULAR | Status: AC
  Administered 2015-12-01: 25 ug via INTRAVENOUS
  Filled 2015-12-01: qty 2

## 2015-12-01 NOTE — H&P (Signed)
Primary Care Physician:  [name], MD Primary Gastroenterologist:  Dr. Oneida Alar  Pre-Procedure History & Physical: HPI:  [name] is a 81 y.o. male here for  BRBPR.  Past Medical History  Diagnosis Date  . COPD (chronic obstructive pulmonary disease) (HCC)     Uses occasional nighttime O2  . Disseminated herpes zoster 2010  . GERD (gastroesophageal reflux disease)   . Asthma   . Pulmonary embolus ([place])     2003 and 2011  . Pulmonary nodules     Chest CT 09/11  . Mixed hyperlipidemia   . Chronic atrial fibrillation ([place])   . Lupus anticoagulant positive   . Essential hypertension, benign   . Cholelithiasis   . Rheumatoid arthritis(714.0)   . History of chicken pox 1941; 2011  . Anemia   . Chronic lower back pain   . Chronic diastolic heart failure ([place])   . Cirrhosis ([place])     Questionable, AFP normal Feb 01/2012, hx of ETOH use   . Headache(784.0)   . Cervical spondylosis without myelopathy   . Stage III chronic kidney disease   . Cellulitis of leg, left 01/03/2015  . Aortic stenosis, moderate 05/21/2015  . Dysrhythmia     chronic AFib  . Diffuse large B cell lymphoma ([place]) 08/22/2015  . DNR (do not resuscitate) 11/01/2015  . Seizure disorder ([place]) 09/2015    Past Surgical History  Procedure Laterality Date  . Polypectomy  02/02/2012    RMR: Multiple colonic polyps removed, flat, tubular adenomas/ Left-sided diverticulosis  . Posterior lumbar vertebrae excision  2003; 2009; 2011  . Myringotomy      "3 times; both ears"  . Cataract extraction w/ intraocular lens  implant, bilateral    . Cholecystectomy  05/23/2012    Procedure: LAPAROSCOPIC CHOLECYSTECTOMY WITH INTRAOPERATIVE CHOLANGIOGRAM;  Surgeon: Stark Klein, MD;  Location: [facility];  Service: General;  Laterality: N/A;  . Esophagogastroduodenoscopy  02/02/12    [id number] size hiatal hernia; otherwise normal exam  . Colonoscopy  01/24/2013    [id number] polyps/colonic diverticulosis  . Back surgery     . Portacath placement    . Lymph node biopsy    . Lymph node biopsy Right 09/11/2015    Procedure: RIGHT INGUINAL LYMPH NODE BIOPSY;  Surgeon: Aviva Signs, MD;  Location: [facility];  Service: General;  Laterality: Right;  . Portacath placement Left 09/11/2015    Procedure: INSERTION PORT-A-CATH;  Surgeon: Aviva Signs, MD;  Location: [facility];  Service: General;  Laterality: Left;    Prior to Admission medications   Medication Sig Start Date End Date Taking? Authorizing Provider  albuterol (PROAIR HFA) 108 (90 BASE) MCG/ACT inhaler Inhale 2 puffs into the lungs every 6 (six) hours as needed. Shortness of Breath 08/25/15  Yes Radene Gunning, NP  allopurinol (ZYLOPRIM) 300 MG tablet Take 1 tablet (300 mg total) by mouth daily. 09/18/15  Yes Kathie Dike, MD  cetirizine (ZYRTEC) 10 MG tablet Take 10 mg by mouth daily.     Yes Historical Provider, MD  [place] 125 MCG tablet TAKE ONE TABLET BY MOUTH DAILY 09/28/15  Yes Satira Sark, MD  diltiazem (CARDIZEM CD) 240 MG 24 hr capsule Take 1 capsule (240 mg total) by mouth daily. 10/02/15  Yes Kathie Dike, MD  diphenhydrAMINE (BENADRYL) 25 MG tablet Take 25 mg by mouth every 6 (six) hours as needed for itching or allergies.   Yes Historical Provider, MD  ferrous sulfate 325 (65 FE) MG tablet Take 325 mg  by mouth daily.   Yes Historical Provider, MD  fluticasone (FLONASE) 50 MCG/ACT nasal spray Place 2 sprays into both nostrils daily.  08/25/15  Yes Historical Provider, MD  folic acid (FOLVITE) 1 MG tablet Take 1 mg by mouth daily.    Yes Historical Provider, MD  furosemide (LASIX) 40 MG tablet Take 1 tablet (40 mg total) by mouth daily. Take daily for prevention of fluid buildup. 10/02/15  Yes Kathie Dike, MD  gabapentin (NEURONTIN) 300 MG capsule Take 2 capsules (600 mg total) by mouth 2 (two) times daily. 08/25/15  Yes Lezlie Octave Black, NP  ipratropium-albuterol (DUONEB) 0.5-2.5 (3) MG/3ML SOLN Take 3 mLs by nebulization every 6 (six) hours as  needed. Patient taking differently: Take 3 mLs by nebulization every 6 (six) hours as needed (shortness of breath).  09/18/15  Yes Kathie Dike, MD  ketoconazole (NIZORAL) 2 % shampoo Apply 1 application topically 3 (three) times a week.  05/21/13  Yes Historical Provider, MD  levETIRAcetam (KEPPRA) 500 MG tablet Take 1 tablet (500 mg total) by mouth 2 (two) times daily. 10/21/15  Yes Samuella Cota, MD  Omega-3 Fatty Acids (FISH OIL) 1000 MG CAPS Take 2 capsules by mouth 2 (two) times daily.    Yes Historical Provider, MD  omeprazole (PRILOSEC) 40 MG capsule Take 40 mg by mouth daily.   Yes Historical Provider, MD  ondansetron (ZOFRAN) 8 MG tablet Take 1 tablet every 8 hours as needed for nausea/vomiting. Patient taking differently: Take 8 mg by mouth every 8 (eight) hours as needed for nausea or vomiting.  09/16/15  Yes Baird Cancer, PA-C  Oxycodone HCl 10 MG TABS Take 1 tablet (10 mg total) by mouth every 6 (six) hours as needed (pain). 11/13/15  Yes Manon Hilding Kefalas, PA-C  potassium chloride SA (K-DUR,KLOR-CON) 20 MEQ tablet Take 0.5 tablets (10 mEq total) by mouth daily. Starting 08/27/15. 08/25/15  Yes Lezlie Octave Black, NP  predniSONE (DELTASONE) 20 MG tablet On Days 1-5 of chemo take 4.5 tablets (90mg ) daily. 10/05/15  Yes Baird Cancer, PA-C  prochlorperazine (COMPAZINE) 10 MG tablet Take 1 tablet (10 mg total) by mouth every 6 (six) hours as needed (Nausea or vomiting). Patient taking differently: Take 10 mg by mouth every 6 (six) hours as needed (Nausea or vomiting). nausea 09/16/15  Yes Baird Cancer, PA-C  Rivaroxaban (XARELTO STARTER PACK) 15 & 20 MG TBPK Take as directed on package: Start with one 15mg  tablet by mouth twice a day with food. On Day 22, switch to one 20mg  tablet once a day with food. 10/05/15  Yes Baird Cancer, PA-C  Sennosides-Docusate Sodium (SENOKOT S PO) Take 1 tablet by mouth daily.    Yes Historical Provider, MD  tadalafil (CIALIS) 5 MG tablet Take 5 mg by  mouth daily as needed for erectile dysfunction.  05/18/15  Yes Historical Provider, MD  tamsulosin (FLOMAX) 0.4 MG CAPS capsule Take 1 capsule (0.4 mg total) by mouth at bedtime. For prostate treatment. 05/25/15  Yes Rexene Alberts, MD  Vitamin D, Ergocalciferol, (DRISDOL) 50000 UNITS CAPS capsule Take 1 capsule by mouth once a week. friday 04/27/15  Yes Historical Provider, MD  lidocaine-prilocaine (EMLA) cream Apply a quarter size amount to port site 1 hour prior to chemo. Do not rub in. Cover with plastic wrap. 09/17/15   Patrici Ranks, MD  methocarbamol (ROBAXIN) 500 MG tablet Take 1 tablet (500 mg total) by mouth every 8 (eight) hours as needed for muscle spasms. 12/01/15  Reyne Dumas, MD  oxyCODONE 10 MG TABS Take 1 tablet (10 mg total) by mouth every 6 (six) hours as needed for moderate pain. 12/01/15   Reyne Dumas, MD    Allergies as of 11/28/2015 - Review Complete 11/28/2015  Allergen Reaction Noted  . Cymbalta [duloxetine hcl] Other (See Comments) 05/21/2012  . Procaine hcl Hives and Other (See Comments)   . Bystolic [nebivolol hcl] Other (See Comments) 07/16/2013    Family History  Problem Relation Age of Onset  . Colon cancer Neg Hx   . Liver disease Neg Hx   . Diabetes Mother     Social History   Social History  . Marital Status: Widowed    Spouse Name: N/A  . Number of Children: 6  . Years of Education: 7th   Occupational History  . retired   . RETIRED    Social History Main Topics  . Smoking status: Former Smoker -- 1.00 packs/day for 57 years    Types: Cigarettes    Quit date: 01/27/1998  . Smokeless tobacco: Never Used  . Alcohol Use: No     Comment: "quit drinking 1976"  . Drug Use: No  . Sexual Activity: Not Currently   Other Topics Concern  . Not on file   Social History Narrative    Review of Systems: See HPI, otherwise negative ROS   Physical Exam: BP 92/59 mmHg  Pulse 86  Temp(Src) 98.4 F (36.9 C) (Oral)  Resp 15  Ht 5\' 8"  (1.727 m)   Wt 178 lb 12.7 oz (81.1 kg)  BMI 27.19 kg/m2  SpO2 98% General:   Alert,  pleasant and cooperative in NAD Head:  Normocephalic and atraumatic. Neck:  Supple; Lungs:  Clear throughout to auscultation.    Heart:  IRRegular rhythm. Abdomen:  Soft, nontender and nondistended. Normal bowel sounds, without guarding, and without rebound.   Neurologic:  Alert and  oriented x4;  grossly normal neurologically.  Impression/Plan:    BRBPR  PLAN: TCS TODAY

## 2015-12-01 NOTE — Progress Notes (Signed)
[facility] PROGRESS NOTE  [name] [id number] DOB: 1931/06/02 DOA: 11/28/2015 PCP: [name], MD  Assessment/Plan: 1. Suspected upper GI bleed -[name] having a history of A. fib and pulmonary embolism for which he had been anticoagulated with Xarelto, presenting with complaints of dark stools mixed with bright red blood per rectum over the past week. -Initial lab work showed a hemoglobin of 10.4 that trended down to 8.5 , s/p 2 units , hemoglobin now back up to 10.4 -Anticoagulation has been discontinued on admission. -GI consulted , did  upper endoscopy/capsule endoscopy which showed  Scattered punctate antral telangiectasia without stigmata of bleed.Three small punctate jejunal telangiectasia without stigmata of bleed. Patient may not have bled from these lesions per GI notes . -Continue IV Protonix GI recommending colonoscopy today GI to recommend when to start xarelto , pending colonoscopy results   2.  Acute blood loss anemia Received 2 units , increase to 11.1, now 10.4 -He reported generalized weakness along with syncope event at home. I suspect symptoms related to blood loss.   3.  Acute kidney injury. -Likely secondary to hypovolemia insetting of GI bleed. Initial lab work revealed a creatinine of 1.37 with BUN of 33. On this mornings labs pending, down to 1.19 with BUN of 24 with volume replacement.  4.  History of pulmonary embolism. -Due to presence of GI bleed anticoagulation has been held.  5.  History of diffuse large cell lymphoma -Patient currently receiving treatment at the cancer center where he is followed by Dr Larey Seat of medical oncology. -He has stage IV with involvement of bone marrow. -Status post treatment with R-CHOP which tolerance was an issue. Patient transition to palliative management with single agent Rituxan    6.  Probable demand ischemia. -Lab work revealing troponin of 0.08 which has remained stable after checking  for sats. He denies chest pain. Suspect secondary to demand ischemia from hypovolemia.  7.  Chronic diastolic congestive heart failure. -His last transthoracic echocardiogram was performed on 05/20/2015 that showed preserved ejection fraction of 65-70%. Due to hypovolemia is diuretic was held on admission. -He does not have clinical evidence of volume overload, examination today.  8.  Chronic atrial fibrillation. -As mentioned above anticoagulation discontinued due to presence of GI bleed. -Ventricular rates in the 80s to low 100's -Plan to continue digoxin and Cardizem  9.  Syncope -Patient reporting dizziness prior to event. I suspect syncope likely was related to hypovolemia in setting of GI bleed. Refused midodrine ,states BP always runs low in the 90's ," I'm on too many medications" -He is on continuous cardiac monitoring  10. Back pain-unable to participate with PT yesterday, started on oxycodone and Robaxin We will give 1 dose of Solu-Medrol to help with the pain  Code Status: DO NOT RESUSCITATE Family Communication: Family not present Disposition Plan: Plan for colonoscopy today, PT eval, patient needs adequate pain control, anticipate discharge tomorrow   Consultants:  GI  Procedures:  Plan EGD for 11/29/2015   HPI  [name] is a pleasant 81 year old gentleman with past medical history of diffuse large cell lymphoma, who is currently being followed at the cancer center, status post treatment with single agent rituximab after intolerance to R-CHOP, admitted to the medicine service on 11/28/2015. He was in with complaints of dark stools mixed with bright red blood per rectum over the course of the week, then had a syncopal of yesterday in the bathroom after standing up. He denied hematemesis, chest pain, shortness of  breath. Initial lab work showing hemoglobin of 10.4 that trended down to 8.4 on this morning's lab work. He is chronically anticoagulated with Xarelto for  history of A. fib and pulmonary embolism.  Objective: Filed Vitals:   12/01/15 1057 12/01/15 1146  BP: 143/51 141/53  Pulse: 80   Temp:    Resp:      Intake/Output Summary (Last 24 hours) at 12/01/15 1154 Last data filed at 11/30/15 1230  Gross per 24 hour  Intake    240 ml  Output      0 ml  Net    240 ml   Filed Weights   11/29/15 0505 11/30/15 0652 12/01/15 0415  Weight: 73.755 kg (162 lb 9.6 oz) 77.021 kg (169 lb 12.8 oz) 81.1 kg (178 lb 12.7 oz)    Exam:   General:  Patient is awake and alert, nontoxic appearing, reporting diffuse abdominal pain  Cardiovascular: Irregular rate and rhythm normal S1-S2 no murmurs or gallops  Respiratory: Lungs are clear to auscultation bilaterally, normal respiratory effort, no wheezing rhonchi or rales  Abdomen: He has mild generalized pain to palpation, no rebound tenderness guarding peritoneal signs evident.  Musculoskeletal: No edema  Data Reviewed: Basic Metabolic Panel:  Recent Labs Lab 11/28/15 1358 11/29/15 0920 12/01/15 0655  NA 138 138 139  K 3.6 3.4* 3.8  CL 97* 99* 109  CO2 33* 30 30  GLUCOSE 116* 101* 91  BUN 33* 24* 10  CREATININE 1.37* 1.19 1.01  CALCIUM 9.2 8.5* 8.2*   Liver Function Tests:  Recent Labs Lab 11/28/15 1358 11/29/15 0920 12/01/15 0655  AST 21 18 21   ALT 11* 10* 11*  ALKPHOS 58 49 51  BILITOT 1.0 1.1 1.1  PROT 6.3* 5.2* 5.2*  ALBUMIN 3.1* 2.6* 2.6*    Recent Labs Lab 11/28/15 1358  LIPASE 20   No results for input(s): AMMONIA in the last 168 hours. CBC:  Recent Labs Lab 11/28/15 1358 11/29/15 0025 11/29/15 0920 11/29/15 2214 12/01/15 0655  WBC 6.8 5.2 5.2 6.9 5.3  NEUTROABS 4.6  --   --   --   --   HGB 10.4* 8.5* 8.4* 11.1* 10.4*  HCT 30.7* 25.1* 25.2* 33.0* 31.5*  MCV 93.6 93.7 95.1 92.7 94.9  PLT 113* 91* 84* 101* 90*   Cardiac Enzymes:  Recent Labs Lab 11/28/15 1358 11/28/15 1835 11/29/15 0025 11/29/15 0920  TROPONINI 0.08* 0.08* 0.09* 0.08*   BNP  (last 3 results)  Recent Labs  09/13/15 0650 09/29/15 1222 11/28/15 1357  BNP 595.0* 226.0* 144.0*    ProBNP (last 3 results) No results for input(s): PROBNP in the last 8760 hours.  CBG: No results for input(s): GLUCAP in the last 168 hours.  Recent Results (from the past 240 hour(s))  MRSA PCR Screening     Status: Abnormal   Collection Time: 11/28/15 10:28 PM  Result Value Ref Range Status   MRSA by PCR POSITIVE (A) NEGATIVE Final    Comment:        The GeneXpert MRSA Assay (FDA approved for NASAL specimens only), is one component of a comprehensive MRSA colonization surveillance program. It is not intended to diagnose MRSA infection nor to guide or monitor treatment for MRSA infections. RESULT CALLED TO, READ BACK BY AND VERIFIED WITH:  THOMAS,K @ 0200 ON 11/29/15 BY WOODIE,J      Studies: No results found.  Scheduled Meds: . sodium chloride   Intravenous Once  . allopurinol  300 mg Oral Daily  . antiseptic  oral rinse  7 mL Mouth Rinse BID  . Chlorhexidine Gluconate Cloth  6 each Topical Q0600  . digoxin  125 mcg Oral Daily  . diltiazem  30 mg Oral 4 times per day  . fluticasone  2 spray Each Nare Daily  . folic acid  1 mg Oral Daily  . gabapentin  300 mg Oral BID  . levETIRAcetam  500 mg Oral BID  . mupirocin ointment  1 application Nasal BID  . pantoprazole (PROTONIX) IV  40 mg Intravenous Q12H  . senna  2 tablet Oral BID  . tamsulosin  0.4 mg Oral QHS   Continuous Infusions: . sodium chloride 20 mL/hr at 11/30/15 2240  . 0.9 % NaCl with KCl 20 mEq / L 125 mL/hr at 11/30/15 1222    Principal Problem:   Syncope and collapse Active Problems:   Chronic atrial fibrillation (HCC)   Chronic diastolic heart failure (HCC)   Thrombocytopenia (HCC)   Diffuse large B cell lymphoma (HCC)   Chronic respiratory failure with hypoxia (HCC)   AKI (acute kidney injury) ([place])   Dehydration   History of pulmonary embolism   Elevated troponin I level    Normocytic anemia   GI bleed   Epigastric abdominal pain    Time spent: 35 min    [facility]  [facility] Pager [phone]. If 7PM-7AM, please contact night-coverage at [email or web address], password [facility] 12/01/2015, 11:54 AM  LOS: 3 days

## 2015-12-01 NOTE — Op Note (Signed)
[facility] [place] [address] [place], [address]   COLONOSCOPY PROCEDURE REPORT  PATIENT: Tony, Mann  MR#: [id number] BIRTHDATE: 1931/02/23 , 84  yrs. old GENDER: male ENDOSCOPIST: Danie Binder, MD REFERRED [id number] Kefalas, P.A.  Garfield Cornea, M.D.  Dione Housekeeper, M.D. PROCEDURE DATE:  26-Dec-2015    PROCEDURE:   Colonoscopy with biopsy  INDICATIONS:hematochezia and melena.   MEDICATIONS: Fentanyl 100 mcg IV and Versed 5 mg IV  DESCRIPTION OF PROCEDURE:    Physical exam was performed.  Informed consent was obtained from the patient after explaining the benefits, risks, and alternatives to procedure.  The patient was connected to monitor and placed in left lateral position. Continuous oxygen was provided by nasal cannula and IV medicine administered through an indwelling cannula.  After administration of sedation and rectal exam, the patients rectum was intubated and the [id number]  colonoscope was advanced under direct visualization to the cecum.  The scope was removed slowly by carefully examining the color, texture, anatomy, and integrity mucosa on the way out.  The patient was recovered in endoscopy and discharged home in satisfactory condition. Estimated blood loss is zero unless otherwise noted in this procedure report.    COLON FINDINGS: The colon was redundant.  Manual abdominal counter-pressure was used to reach the cecum, There was moderate diverticulosis noted in the sigmoid colon and descending colon with associated muscular hypertrophy and tortuosity.  , Two non-bleeding, clean-based and linear ulcers ranging between 3-5 mm in size were found.  Biopsies were taken.  , and Small internal hemorrhoids were found.  PREP QUALITY: good. CECAL W/D TIME: 11       minutes COMPLICATIONS: None  ENDOSCOPIC IMPRESSION: 1.   The LEFT colon IS redundant 2.   Moderate diverticulosis noted in the sigmoid colon and descending colon 3.   Two  RECTAL ulcers 4.   Small internal hemorrhoids  RECOMMENDATIONS: ADVANCE DIET AWAIT BIOPSY PROTONIX BID  eSigned:  Danie Binder, MD 12-26-15 4:31 PM CPT CODES: ICD CODES:  The ICD and CPT codes recommended by this software are interpretations from the data that the clinical staff has captured with the software.  The verification of the translation of this report to the ICD and CPT codes and modifiers is the sole responsibility of the health care institution and practicing physician where this report was generated.  [employer] will not be held responsible for the validity of the ICD and CPT codes included on this report.  AMA assumes no liability for data contained or not contained herein. CPT is a Designer, television/film set of the [employer].

## 2015-12-01 NOTE — Care Management Note (Signed)
Case Management Note  Patient Details  Name: [name] MRN: [id number] Date of Birth: Nov 29, 1931  Subjective/Objective:                  Pt admitted from home with syncope and collapse and anemia. Pt lives with his daughter and will return home at discharge. Pt is active with [facility] RN.  Action/Plan: Will continue to follow for discharge planning needs.  Expected Discharge Date:                  Expected Discharge Plan:  [place]  In-House Referral:  NA  Discharge planning Services  CM Consult  Post Acute Care Choice:  Resumption of Svcs/PTA Provider Choice offered to:  Patient  DME Arranged:    DME Agency:     HH Arranged:    [place] Agency:     Status of Service:  In process, will continue to follow  Medicare Important Message Given:    Date Medicare IM Given:    Medicare IM give by:    Date Additional Medicare IM Given:    Additional Medicare Important Message give by:     If discussed at [place] of Stay Meetings, dates discussed:    Additional Comments:  Joylene Draft, RN 12/01/2015, 3:33 PM

## 2015-12-01 NOTE — Progress Notes (Signed)
PT Cancellation Note  Patient Details Name: [name] MRN: [id number] DOB: [birth year]   Cancelled Treatment:    Reason Eval/Treat Not Completed: Patient at procedure or test/unavailable. Chart reviewed, RN consulted: pt not available as he has recently gone down for colonoscopy per RN. Will attempt at later date/time.    Vetra Shinall C 12/01/2015, 2:01 PM 2:02 PM  Etta Grandchild, PT, DPT [place] License # [id number]

## 2015-12-02 ENCOUNTER — Encounter (HOSPITAL_COMMUNITY): Payer: Self-pay | Admitting: Internal Medicine

## 2015-12-02 DIAGNOSIS — D649 Anemia, unspecified: Secondary | ICD-10-CM

## 2015-12-02 DIAGNOSIS — I781 Nevus, non-neoplastic: Secondary | ICD-10-CM | POA: Diagnosis present

## 2015-12-02 DIAGNOSIS — K922 Gastrointestinal hemorrhage, unspecified: Secondary | ICD-10-CM

## 2015-12-02 DIAGNOSIS — K626 Ulcer of anus and rectum: Secondary | ICD-10-CM | POA: Diagnosis present

## 2015-12-02 MED ORDER — METHOCARBAMOL 500 MG PO TABS: 500.0000 mg | ORAL_TABLET | Freq: Three times a day (TID) | ORAL | Status: DC | PRN

## 2015-12-02 MED ORDER — PANTOPRAZOLE SODIUM 40 MG PO TBEC
40.0000 mg | DELAYED_RELEASE_TABLET | Freq: Two times a day (BID) | ORAL | Status: DC
  Administered 2015-12-02 – 2015-12-03 (×2): 40 mg via ORAL
  Filled 2015-12-02 (×2): qty 1

## 2015-12-02 NOTE — Progress Notes (Signed)
Subjective:  Doing okay. Ate 100% of breakfast. No BM in 24 hours. No abdominal pain.  Objective: Vital signs in last 24 hours: Temp:  [97.5 F (36.4 C)-97.8 F (36.6 C)] 97.8 F (36.6 C) (12/07 0550) Pulse Rate:  [40-159] 90 (12/07 0550) Resp:  [9-25] 18 (12/07 0550) BP: (75-143)/(41-81) 95/57 mmHg (12/07 0550) SpO2:  [92 %-100 %] 97 % (12/07 0550) Weight:  [177 lb 4 oz (80.4 kg)] 177 lb 4 oz (80.4 kg) (12/07 0500) Last BM Date: 12/01/15 General:   Alert,  Well-developed, well-nourished, pleasant and cooperative in NAD Head:  Normocephalic and atraumatic. Eyes:  Sclera clear, no icterus.  Abdomen:  Soft, nontender and nondistended. Normal bowel sounds, without guarding, and without rebound.   Extremities:  Without clubbing, deformity or edema. Neurologic:  Alert and  oriented x4;  grossly normal neurologically. Skin:  Intact without significant lesions or rashes. Psych:  Alert and cooperative. Normal mood and affect.  Intake/Output from previous day: 12/06 0701 - 12/07 0700 In: 480 [P.O.:480] Out: 300 [Urine:300] Intake/Output this shift:    Lab Results: CBC  Recent Labs  11/29/15 0920 11/29/15 2214 12/01/15 0655  WBC 5.2 6.9 5.3  HGB 8.4* 11.1* 10.4*  HCT 25.2* 33.0* 31.5*  MCV 95.1 92.7 94.9  PLT 84* 101* 90*   BMET  Recent Labs  11/29/15 0920 12/01/15 0655  NA 138 139  K 3.4* 3.8  CL 99* 109  CO2 30 30  GLUCOSE 101* 91  BUN 24* 10  CREATININE 1.19 1.01  CALCIUM 8.5* 8.2*   LFTs  Recent Labs  11/29/15 0920 12/01/15 0655  BILITOT 1.1 1.1  ALKPHOS 49 51  AST 18 21  ALT 10* 11*  PROT 5.2* 5.2*  ALBUMIN 2.6* 2.6*   No results for input(s): LIPASE in the last 72 hours. PT/INR  Recent Labs  11/29/15 2214  LABPROT 15.7*  INR 1.23      Imaging Studies: Ct Head Wo Contrast  11/28/2015  CLINICAL DATA:  Syncopal episodes over the last several days when standing. Generalized weakness. Fatigue and decreased appetite. Fall. The patient takes  Xarelto. Initial encounter. EXAM: CT HEAD WITHOUT CONTRAST TECHNIQUE: Contiguous axial images were obtained from the base of the skull through the vertex without intravenous contrast. COMPARISON:  CT head without contrast 10/18/2015 FINDINGS: Remote lacunar infarcts of the left basal ganglia are stable. No acute cortical infarct, hemorrhage, or mass lesion is present. The basal ganglia are otherwise intact. The insular ribbon is intact bilaterally. No focal cortical defects are evident. Mild generalized atrophy is present. Ventricles are proportionate to the degree of atrophy. The paranasal sinuses and mastoid air cells are clear. The calvarium is intact. No focal extracranial soft tissue lesions are present. Bilateral lens replacements are noted. The globes and orbits are otherwise intact. IMPRESSION: 1. No acute intracranial abnormality or significant interval change. 2. Stable atrophy and white matter disease. 3. 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 Electronically Signed   By: San Morelle M.D.   On: 11/28/2015 17:15   Dg Chest Port 1 View  11/28/2015  CLINICAL DATA:  Dyspnea.  History of asthma EXAM: PORTABLE CHEST 1 VIEW COMPARISON:  10/18/2015 FINDINGS: Right pleural effusion seen previously has resolved. There is chronic hyperinflation and interstitial coarsening consistent with COPD. There is no edema, consolidation, effusion, or pneumothorax. Left subclavian porta catheter, tip at the SVC origin. Stable heart size and aortic contours. IMPRESSION: COPD without acute superimposed finding. Electronically Signed   By: Angelica Chessman  Watts M.D.   On: 11/28/2015 14:28  [2 weeks]   Assessment: 81 year old male with multiple medical issues and chronically anticoagulated for history of afib and PE (lupus anticoagulant positive), has stage IV diffuse large B-cell lymphoma on palliative chemotherapy, presenting with melena mixed with small amounts of bright red blood and symptomatic anemia.  EGD with capsule study performed by Dr. Laural Golden 12/4 without any source for blood loss. Capsule study with a few telangiectasias but without stigmata of bleeding. Colonoscopy yesterday with diverticulosis, two non-bleeding rectal ulcers, small internal hemorrhoids.     Anemia: drop in Hgb to 8.4 during admission but remains stable at 10.4 post 2 units PRBCs. Xarelto is on hold.   Cirrhosis: likely ETOH related. No stigmata of advanced liver disease on recent EGD. CT without contrast Aug 2016 without suspicious liver lesions   Plan: 1. F/U pending path. 2. Switch PPI to oral.  3. Risks vs benefits of Xarelto need to be weighed moving forward. To discuss further with Dr. Gala Romney.   Laureen Ochs. Bernarda Caffey [facility] [phone] 12/7/20169:37 AM     LOS: 4 days

## 2015-12-02 NOTE — Care Management Note (Signed)
Case Management Note  Patient Details  Name: URIYAH ETUE MRN: [id number] Date of Birth: Jan 14, 1931  Subjective/Objective:                    Action/Plan:   Expected Discharge Date:                  Expected Discharge Plan:  [place]  In-House Referral:  NA  Discharge planning Services  CM Consult  Post Acute Care Choice:  Resumption of Svcs/PTA Provider Choice offered to:  Patient  DME Arranged:    DME Agency:     HH Arranged:    [place] Agency:     Status of Service:  Completed, signed off  Medicare Important Message Given:    Date Medicare IM Given:    Medicare IM give by:    Date Additional Medicare IM Given:    Additional Medicare Important Message give by:     If discussed at [place] of Stay Meetings, dates discussed:    Additional Comments: Pt has home O2, neb machine, and walker for home use.  Christinia Gully [place], RN 12/02/2015, 11:17 AM

## 2015-12-02 NOTE — Progress Notes (Signed)
[facility] PROGRESS NOTE  [name] [id number] DOB: 08-05-1931 DOA: 11/28/2015 PCP: [name], MD    Code Status: DNR Family Communication: Discussed with daughter, [name].  Disposition Plan: Anticipate discharge to home tomorrow.   Consultants:  Gastroenterology  Procedures:  Colonoscopy with biopsy 12/01/2015, Dr. Doristine Mango left colon; moderate diverticulosis in the sigmoid colon and descending colon; 2 rectal ulcers; small internal hemorrhoids.   EGD with capsule study 11/29/15, Dr. Rehman-scattered punctate antral telangiectasia without stigmata of bleed; 3 small punctate jejunal telangiectasia without stigmata of bleed.   Antibiotics:  None  HPI/Subjective: Patient denies bowel movement overnight. He has less abdominal pain. His appetite has improved.  Objective: Filed Vitals:   12/02/15 0550 12/02/15 1200  BP: 95/57 98/67  Pulse: 90   Temp: 97.8 F (36.6 C)   Resp: 18    oxygen saturation 97% on nasal cannula oxygen.  Intake/Output Summary (Last 24 hours) at 12/02/15 1652 Last data filed at 12/02/15 0900  Gross per 24 hour  Intake    840 ml  Output      0 ml  Net    840 ml   Filed Weights   11/30/15 0652 12/01/15 0415 12/02/15 0500  Weight: 77.021 kg (169 lb 12.8 oz) 81.1 kg (178 lb 12.7 oz) 80.4 kg (177 lb 4 oz)    Exam:   General: pleasant alert elderly man in no acute distress.    Cardiovascular: Irregular, irregular.  Respiratory: decreased breath sounds at bases, otherwise clear. Breathing is nonlabored.  Abdomen: positive bowel sounds, soft, nontender, nondistended.  Musculoskeletal/extremities: No pedal edema. No acute hot red joints.   Data Reviewed: Basic Metabolic Panel:  Recent Labs Lab 11/28/15 1358 11/29/15 0920 12/01/15 0655  NA 138 138 139  K 3.6 3.4* 3.8  CL 97* 99* 109  CO2 33* 30 30  GLUCOSE 116* 101* 91  BUN 33* 24* 10  CREATININE 1.37* 1.19 1.01  CALCIUM 9.2 8.5* 8.2*   Liver  Function Tests:  Recent Labs Lab 11/28/15 1358 11/29/15 0920 12/01/15 0655  AST 21 18 21   ALT 11* 10* 11*  ALKPHOS 58 49 51  BILITOT 1.0 1.1 1.1  PROT 6.3* 5.2* 5.2*  ALBUMIN 3.1* 2.6* 2.6*    Recent Labs Lab 11/28/15 1358  LIPASE 20   No results for input(s): AMMONIA in the last 168 hours. CBC:  Recent Labs Lab 11/28/15 1358 11/29/15 0025 11/29/15 0920 11/29/15 2214 12/01/15 0655  WBC 6.8 5.2 5.2 6.9 5.3  NEUTROABS 4.6  --   --   --   --   HGB 10.4* 8.5* 8.4* 11.1* 10.4*  HCT 30.7* 25.1* 25.2* 33.0* 31.5*  MCV 93.6 93.7 95.1 92.7 94.9  PLT 113* 91* 84* 101* 90*   Cardiac Enzymes:  Recent Labs Lab 11/28/15 1358 11/28/15 1835 11/29/15 0025 11/29/15 0920  TROPONINI 0.08* 0.08* 0.09* 0.08*   BNP (last 3 results)  Recent Labs  09/13/15 0650 09/29/15 1222 11/28/15 1357  BNP 595.0* 226.0* 144.0*    ProBNP (last 3 results) No results for input(s): PROBNP in the last 8760 hours.  CBG: No results for input(s): GLUCAP in the last 168 hours.  Recent Results (from the past 240 hour(s))  MRSA PCR Screening     Status: Abnormal   Collection Time: 11/28/15 10:28 PM  Result Value Ref Range Status   MRSA by PCR POSITIVE (A) NEGATIVE Final    Comment:        The GeneXpert MRSA Assay (FDA approved for NASAL  specimens only), is one component of a comprehensive MRSA colonization surveillance program. It is not intended to diagnose MRSA infection nor to guide or monitor treatment for MRSA infections. RESULT CALLED TO, READ BACK BY AND VERIFIED WITH:  THOMAS,K @ 0200 ON 11/29/15 BY WOODIE,J      Studies: No results found.  Scheduled Meds: . sodium chloride   Intravenous Once  . allopurinol  300 mg Oral Daily  . antiseptic oral rinse  7 mL Mouth Rinse BID  . Chlorhexidine Gluconate Cloth  6 each Topical Q0600  . digoxin  125 mcg Oral Daily  . diltiazem  30 mg Oral 4 times per day  . fluticasone  2 spray Each Nare Daily  . folic acid  1 mg Oral  Daily  . gabapentin  300 mg Oral BID  . levETIRAcetam  500 mg Oral BID  . methylPREDNISolone (SOLU-MEDROL) injection  40 mg Intravenous Once  . mupirocin ointment  1 application Nasal BID  . pantoprazole  40 mg Oral BID AC  . senna  2 tablet Oral BID  . tamsulosin  0.4 mg Oral QHS   Continuous Infusions: . 0.9 % NaCl with KCl 20 mEq / L 125 mL/hr at 11/30/15 1222   Assessment pain:  Principal Problem:   Syncope and collapse Active Problems:   Dehydration   Elevated troponin I level   GI bleed   Epigastric abdominal pain   Chronic atrial fibrillation (HCC)   Diffuse large B cell lymphoma (HCC)   Chronic respiratory failure with hypoxia (HCC)   AKI (acute kidney injury) (HCC)   Chronic diastolic heart failure (HCC)   Thrombocytopenia (HCC)   History of pulmonary embolism   Normocytic anemia   Rectal ulcer   Telangiectasia   1. Syncope with collapse. On admission, the patient's head CT was nonacute. On exam, he appeared to be neurologically intact. The etiology was thought to be secondary to orthostatic hypotension from dehydration or dehydration from subacute blood loss.. Lasix was initially held and he was given a bolus of IV fluids in the ED. He was continued on IV fluid for hydration. He was transfused packed red blood cells as discussed below. -He had no further syncope or presyncopal episodes during hospital course.   Subacute/chronic GI bleed. Patient has a history of PE and atrial fibrillation and is treated with Xarelto chronically. On admission, he had gross melena. He reported some bright red blood per rectum at home. He was started on IV Protonix. Xarelto was withheld. Gastroenterology was consulted. Dr. Laural Golden performed a small bowel capsule study with EGD. It revealed antral and jejunal telangiectasia without stigmata of bleeding. He was further evaluated with a colonoscopy by Dr. Oneida Alar. It revealed 2 rectal ulcers, small internal hemorrhoids, moderate  diverticulosis, and left colon redundancy. Biopsies were taken. -There was no obvious source of bleeding from the upper and lower endoscopies, however, given melena, the source was likely upper GI. -We'll continue PPI and follow-up on the pathology results. -We'll consider restarting Xarelto in the next 24-48 hours, pending the discussion with GI. The patient appears to be in favor of this.  Acute blood loss anemia. Patient's hemoglobin was 10.4 on admission. It fell to a nadir of 8.4. He was transfused 2 units of packed red blood cells with improvement in his hemoglobin. -Over the past 24 hours, his hemoglobin has been stable.  Chronic atrial fibrillation with mild RVR. The patient is treated chronically with digoxin and diltiazem. Because of his soft blood pressures  on admission, diltiazem was changed to 30 mg every 6 hours rather than 240 mg daily. Anticoagulation was withheld secondary to the GI bleed. His digoxin level was 0.7. -His heart rate has improved. We'll transition diltiazem to once daily at the time of discharge.  Acute kidney injury. The patient likely has a history of mild CK D, stage II. His creatinine 4 weeks ago was 1.14. It was 1.37 on admission. This was likely secondary to dehydration from subacute/chronic GI bleeding and prerenal azotemia in the setting of Lasix therapy. He was started on IV fluids. Lasix was withheld. His creatinine has improved to baseline.  Chronic diastolic heart failure. Currently compensated. We'll continue to hold Lasix for another day or 2.  Elevated troponin I. The patient's troponin I was marginally elevated. This was likely secondary to demand ischemia secondary to hypovolemia and AKI. Patient denies chest pain.  Thrombocytopenia. 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 We'll continue to monitor.  Back pain, chronic. The patient was prescribed 1 dose of Cytomel Solu-Medrol and started on oxycodone and Robaxin when  necessary.        Time spent: 30 minutes.    [facility] Pager [phone]. If 7PM-7AM, please contact night-coverage at [email or web address], password [facility] 12/02/2015, 4:52 PM  LOS: 4 days

## 2015-12-02 NOTE — Progress Notes (Signed)
PT Cancellation Note  Patient Details Name: [name] MRN: [id number] DOB: 01-22-1931   Cancelled Treatment:    Reason Eval/Treat Not Completed: Fatigue/lethargy limiting ability to participate.  I tried to convince pt to work with me at whatever level he wished but he said that he was too tired from treatment of the last few days.  We discussed the fact that he will continue to get weaker but to no avail.  His daughter was present and does not seem concerned that he refuses to get OOB.  She did state that he has been sitting at the EOB.  We discussed DME at home and I do not think that anything will needed.   Demetrios Isaacs L  PT 12/02/2015, 10:56 AM [phone]

## 2015-12-02 NOTE — Progress Notes (Signed)
Late entry: my assessment states that patient has melanoma. This should say "lymphoma".

## 2015-12-03 ENCOUNTER — Telehealth: Payer: Self-pay | Admitting: Gastroenterology

## 2015-12-03 DIAGNOSIS — I781 Nevus, non-neoplastic: Secondary | ICD-10-CM

## 2015-12-03 LAB — CBC
HEMATOCRIT: 30.4 % — AB (ref 39.0–52.0)
HEMOGLOBIN: 10.2 g/dL — AB (ref 13.0–17.0)
MCH: 31.6 pg (ref 26.0–34.0)
MCHC: 33.6 g/dL (ref 30.0–36.0)
MCV: 94.1 fL (ref 78.0–100.0)
Platelets: 77 10*3/uL — ABNORMAL LOW (ref 150–400)
RBC: 3.23 MIL/uL — ABNORMAL LOW (ref 4.22–5.81)
RDW: 15.5 % (ref 11.5–15.5)
WBC: 4.5 10*3/uL (ref 4.0–10.5)

## 2015-12-03 LAB — GLUCOSE, CAPILLARY: GLUCOSE-CAPILLARY: 95 mg/dL (ref 65–99)

## 2015-12-03 MED ORDER — GABAPENTIN 300 MG PO CAPS: 300.0000 mg | ORAL_CAPSULE | Freq: Two times a day (BID) | ORAL | Status: AC

## 2015-12-03 MED ORDER — OMEPRAZOLE 40 MG PO CPDR: 40.0000 mg | DELAYED_RELEASE_CAPSULE | Freq: Two times a day (BID) | ORAL | Status: AC

## 2015-12-03 MED ORDER — DILTIAZEM HCL ER COATED BEADS 120 MG PO CP24: 120.0000 mg | ORAL_CAPSULE | Freq: Every day | ORAL | Status: DC

## 2015-12-03 MED ORDER — OXYCODONE HCL 10 MG PO TABS: 10.0000 mg | ORAL_TABLET | Freq: Four times a day (QID) | ORAL | Status: DC | PRN

## 2015-12-03 MED ORDER — POTASSIUM CHLORIDE CRYS ER 20 MEQ PO TBCR: 10.0000 meq | EXTENDED_RELEASE_TABLET | Freq: Every day | ORAL | Status: DC | PRN

## 2015-12-03 MED ORDER — METHOCARBAMOL 500 MG PO TABS: 500.0000 mg | ORAL_TABLET | Freq: Three times a day (TID) | ORAL | Status: AC | PRN

## 2015-12-03 MED ORDER — FUROSEMIDE 40 MG PO TABS: 40.0000 mg | ORAL_TABLET | Freq: Every day | ORAL | Status: DC | PRN

## 2015-12-03 NOTE — Progress Notes (Signed)
Subjective:  Patient wants to go home. Denies ongoing bleeding. No abdominal pain.  Objective: Vital signs in last 24 hours: Temp:  [97.3 F (36.3 C)-98.5 F (36.9 C)] 98.5 F (36.9 C) (12/08 0451) Pulse Rate:  [92-107] 97 (12/08 0451) Resp:  [18-20] 20 (12/08 0451) BP: (98-110)/(55-83) 99/82 mmHg (12/08 0451) SpO2:  [95 %-97 %] 95 % (12/08 0451) Weight:  [183 lb 9.6 oz (83.28 kg)] 183 lb 9.6 oz (83.28 kg) (12/08 0451) Last BM Date: 12/01/15 General:   Alert,  Well-developed, well-nourished, pleasant and cooperative in NAD Head:  Normocephalic and atraumatic. Eyes:  Sclera clear, no icterus.  Abdomen:  Soft, nontender and nondistended.   Extremities:  Without clubbing, deformity or edema. Neurologic:  Alert and  oriented x4;  grossly normal neurologically. Skin:  Intact without significant lesions or rashes. Psych:  Alert and cooperative. Normal mood and affect.  Intake/Output from previous day: 12/07 0701 - 12/08 0700 In: 1080 [P.O.:1080] Out: 900 [Urine:900] Intake/Output this shift:    Lab Results: CBC  Recent Labs  12/01/15 0655 12/03/15 0636  WBC 5.3 4.5  HGB 10.4* 10.2*  HCT 31.5* 30.4*  MCV 94.9 94.1  PLT 90* 77*   BMET  Recent Labs  12/01/15 0655  NA 139  K 3.8  CL 109  CO2 30  GLUCOSE 91  BUN 10  CREATININE 1.01  CALCIUM 8.2*   LFTs  Recent Labs  12/01/15 0655  BILITOT 1.1  ALKPHOS 51  AST 21  ALT 11*  PROT 5.2*  ALBUMIN 2.6*   No results for input(s): LIPASE in the last 72 hours. PT/INR No results for input(s): LABPROT, INR in the last 72 hours.    Imaging Studies: Ct Head Wo Contrast  11/28/2015  CLINICAL DATA:  Syncopal episodes over the last several days when standing. Generalized weakness. Fatigue and decreased appetite. Fall. The patient takes Xarelto. Initial encounter. EXAM: CT HEAD WITHOUT CONTRAST TECHNIQUE: Contiguous axial images were obtained from the base of the skull through the vertex without intravenous contrast.  COMPARISON:  CT head without contrast 10/18/2015 FINDINGS: Remote lacunar infarcts of the left basal ganglia are stable. No acute cortical infarct, hemorrhage, or mass lesion is present. The basal ganglia are otherwise intact. The insular ribbon is intact bilaterally. No focal cortical defects are evident. Mild generalized atrophy is present. Ventricles are proportionate to the degree of atrophy. The paranasal sinuses and mastoid air cells are clear. The calvarium is intact. No focal extracranial soft tissue lesions are present. Bilateral lens replacements are noted. The globes and orbits are otherwise intact. IMPRESSION: 1. No acute intracranial abnormality or significant interval change. 2. Stable atrophy and white matter disease. 3. 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 Electronically Signed   By: San Morelle M.D.   On: 11/28/2015 17:15   Dg Chest Port 1 View  11/28/2015  CLINICAL DATA:  Dyspnea.  History of asthma EXAM: PORTABLE CHEST 1 VIEW COMPARISON:  10/18/2015 FINDINGS: Right pleural effusion seen previously has resolved. There is chronic hyperinflation and interstitial coarsening consistent with COPD. There is no edema, consolidation, effusion, or pneumothorax. Left subclavian porta catheter, tip at the SVC origin. Stable heart size and aortic contours. IMPRESSION: COPD without acute superimposed finding. Electronically Signed   By: Monte Fantasia M.D.   On: 11/28/2015 14:28  [2 weeks]   Assessment: 81 year old male with multiple medical issues and chronically anticoagulated for history of afib and PE (lupus anticoagulant positive), has stage IV diffuse large B-cell lymphoma on  palliative chemotherapy, presenting with melena mixed with small amounts of bright red blood and symptomatic anemia. EGD with capsule study performed by Dr. Laural Golden 12/4 without any source for blood loss. Capsule study with a few telangiectasias but without stigmata of bleeding. Colonoscopy yesterday  with diverticulosis, two non-bleeding rectal ulcers, small internal hemorrhoids.   Anemia: drop in Hgb to 8.4 during admission but remains stable at 10.4 post 2 units PRBCs. Xarelto is on hold.   Cirrhosis: likely ETOH related. No stigmata of advanced liver disease on recent EGD. CT without contrast Aug 2016 without suspicious liver lesions  Plan: 1. F/u on pending path. 2. Continue PPI. 3. Discussed with Dr. Caryn Section, Dr. Gala Romney, patient. If benefits of Xarelto outweigh the risks, may resume Xarelto at any time. Would monitor closely for progressive anemia and recurrent GI bleeding.  4. Given evidence of rectal ulcers, would maintain bowel regimen to avoid constipation.  4. Return to office in 6 weeks for follow up.   Laureen Ochs. Bernarda Caffey [facility] [phone] 12/8/201610:57 AM     LOS: 5 days

## 2015-12-03 NOTE — Discharge Summary (Signed)
Physician Discharge Summary  DAGO MOM [id number] DOB: [birth year] DOA: 11/28/2015  PCP: [name], MD  Admit date: 11/28/2015 Discharge date: 12/03/2015  Time spent: Greater than 30 minutes  Recommendations for Outpatient Follow-up:  1. Rectal biopsy pending at the time of discharge. 2. Recommend rechecking the patient's heart rate and blood pressure on a lower dose of diltiazem at the follow-up evaluation. 3. Recommend rechecking the patient's CBC in 1-2 weeks.  4. Home health services reinstated at the time of discharge.   Discharge Diagnoses:  1. Syncope with collapse secondary to orthostatic hypotension from dehydration. 2. Subacute/chronic GI bleed with melena in the setting of anticoagulation with xarleto. - The source of the bleeding was not confirmed by gastroenterology during the hospitalization. 3. 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 4. 2 rectal ulcers, small internal hemorrhoids, moderate diverticulosis per colonoscopy. Biopsies taken. No active bleeding. 5. Acute blood loss anemia. Status post 2 units of packed red blood cell transfusion. 6. Chronic atrial fibrillation with mild RVR, on anticoagulation. 7. Acute kidney injury secondary to prerenal azotemia. 8. Chronic diastolic heart failure. Remained stable. 9. Mildly elevated troponin I secondary to demand ischemia from hypovolemia and GI blood loss. 10. Thrombocytopenia, likely from chemotherapy. 11. Metastatic diffuse large B-cell lymphoma. 12. Chronic respiratory failure with hypoxia secondary to oxygen dependent COPD. 13. History of pulmonary embolus, on anticoagulation.   Discharge Condition: improved    Diet recommendation: heart healthy   Filed Weights   12/01/15 0415 12/02/15 0500 12/03/15 0451  Weight: 81.1 kg (178 lb 12.7 oz) 80.4 kg (177 lb 4 oz) 83.28 kg (183 lb 9.6 oz)    History of present illness:  The patient is an  81 year old man with a history of stage IV metastatic lymphoma on palliative chemotherapy, chronic atrial fibrillation on anticoagulation with Xarelto, chronic diastolic heart failure, prior pulmonary embolus, seizure disorder, and oxygen dependent COPD who presented to the emergency department on 11/28/15 after passing out at home. In the ED, he was afebrile and mildly tachycardic. When he was assisted to the bedside commode, his heart rate increased to the 150s and he had some shortness of breath. His EKG revealed atrial fibrillation with a heart rate of 101 and bifascicular block. CT of his head revealed remote lacunar infarcts, but no acute intracranial abnormality. His lab data were significant for a BUN of 33, creatinine 1.37, normal LFTs and lipase, BNP of 144, troponin I 0.08, hemoglobin of 10.4, and platelet count of 113. He was admitted for further evaluation and management.  Hospital Course:  1. Syncope with collapse. On admission, the patient's head CT was nonacute. On exam, he appeared to be neurologically intact. The etiology was thought to be secondary to orthostatic hypotension from dehydration or dehydration from subacute blood loss.. Lasix was initially held and he was given a bolus of IV fluids in the ED. He was continued on IV fluid for hydration. He was transfused packed red blood cells as discussed below. -He had no further syncope or presyncopal episodes during the hospital course. Subacute/chronic GI bleed. Patient has a history of PE and atrial fibrillation and is treated with Xarelto chronically. On admission, he had gross melena. He reported some bright red blood per rectum at home. He was started on IV Protonix. Xarelto was withheld. Gastroenterology was consulted. Dr. Laural Golden performed a small bowel capsule study with EGD. It revealed antral and jejunal telangiectasia without stigmata of bleeding. He was further evaluated with a  colonoscopy by Dr. Oneida Alar. It revealed 2 rectal  ulcers, small internal hemorrhoids, moderate diverticulosis, and left colon redundancy. Biopsies were taken. -There was no obvious source of bleeding from the upper and lower endoscopies, per GI, therefore, the exact source of bleeding was unknown. -Rectal ulcer biopsy results were pending at the time of discharge. GI plans to follow-up on the results. -Patient was discharged on twice a day dosing of omeprazole. Xarelto was eventually restarted after a discussion with GI and the family. It was felt that the risk of being off of Xarelto was greater than the risk of being on it, given his history of PE and hypercoagulable state.  Would recommend follow-up of his CBC in 1-2 weeks. Acute blood loss anemia. Patient's hemoglobin was 10.4 on admission. It fell to a nadir of 8.4. He was transfused 2 units of packed red blood cells with improvement in his hemoglobin. -His hemoglobin stabilized at 10.2. Would recommend follow-up of his CBC in 1-2 weeks. Chronic atrial fibrillation with mild RVR. The patient is treated chronically with digoxin and diltiazem. Because of his soft blood pressures on admission, diltiazem was changed to 30 mg every 6 hours rather than 240 mg daily. Anticoagulation was withheld secondary to the GI bleed. His digoxin level was 0.7. -His heart rate has improved, but his blood pressure remained in the upper 0000000 to [id number] systolically. -Therefore, diltiazem dosing was decreased to 120 mg daily. Would recommend rechecking his heart rate and blood pressure and titrating diltiazem back up to 240 if his heart rate is consistently above 110. Acute kidney injury. The patient likely has a history of mild CKD, stage II. His creatinine 4 weeks ago was 1.14. It was 1.37 on admission. This was likely secondary to dehydration from subacute/chronic GI bleeding and prerenal azotemia in the setting of Lasix therapy. He was started on IV fluids. Lasix was withheld. His creatinine has improved to baseline. He  was instructed to restart Lasix as needed for perceived leg swelling and fluid buildup in his lungs. Chronic diastolic heart failure. It was compensated during the hospital course. Lasix was held and he was gently hydrated and transfused packed red blood cells. At the time of discharge, he was instructed to restart Lasix as needed for fluid retention. Elevated troponin I. The patient's troponin I was marginally elevated. This was likely secondary to demand ischemia secondary to hypovolemia and AKI. Patient denied chest pain. Thrombocytopenia. 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 His platelet count was 77 at the time of discharge. Would recommend further monitoring with a CBC in 1-2 weeks or per oncology. Stage IV DLBCL with positive bone marrow involvement. The patient is followed by Dr. Whitney Muse. He was recently transitioned to palliative single agent chemotherapy. He will follow-up with her per their schedule. Back pain, chronic. The patient was prescribed 1 dose of  Solu-Medrol and started on oxycodone and Robaxin when necessary. His back pain subsided.    Procedures: 1. Colonoscopy with biopsy 12/01/2015, Dr. Doristine Mango left colon; moderate diverticulosis in the sigmoid colon and descending colon; 2 rectal ulcers; small internal hemorrhoids.  EGD with capsule study 11/29/15, Dr. Rehman-scattered punctate antral telangiectasia without stigmata of bleed; 3 small punctate jejunal telangiectasia without stigmata of bleed.  Consultations:  Gastroenterology   Discharge Exam: Filed Vitals:   12/03/15 0451 12/03/15 1200  BP: 99/82 106/67  Pulse: 97 86  Temp: 98.5 F (36.9 C)   Resp: 20     2. General: pleasant alert elderly man in no  acute distress.  3. Cardiovascular: Irregular, irregular. 4. Respiratory: decreased breath sounds at bases, otherwise clear. Breathing is nonlabored. 5. Abdomen: positive bowel sounds, soft, nontender,  nondistended. 6. Musculoskeletal/extremities: No pedal edema. No acute hot red joints  Discharge Instructions   Discharge Instructions    Diet general    Complete by:  As directed      Discharge instructions    Complete by:  As directed   Some of your medications have changed. Review closely with your family.     Increase activity slowly    Complete by:  As directed           Discharge Medication List as of 12/03/2015  2:25 PM    CONTINUE these medications which have CHANGED   Details  diltiazem (CARDIZEM CD) 120 MG 24 hr capsule Take 1 capsule (120 mg total) by mouth daily., Starting 12/03/2015, Until Discontinued, Print    furosemide (LASIX) 40 MG tablet Take 1 tablet (40 mg total) by mouth daily as needed for fluid (For fluid buildup in your lungs and legs.). Take daily for prevention of fluid buildup., Starting 12/03/2015, Until Discontinued, No Print    gabapentin (NEURONTIN) 300 MG capsule Take 1 capsule (300 mg total) by mouth 2 (two) times daily., Starting 12/03/2015, Until Discontinued, No Print    methocarbamol (ROBAXIN) 500 MG tablet Take 1 tablet (500 mg total) by mouth every 8 (eight) hours as needed for muscle spasms., Starting 12/03/2015, Until Discontinued, Print    omeprazole (PRILOSEC) 40 MG capsule Take 1 capsule (40 mg total) by mouth 2 (two) times daily., Starting 12/03/2015, Until Discontinued, Print    Oxycodone HCl 10 MG TABS Take 1 tablet (10 mg total) by mouth every 6 (six) hours as needed., Starting 12/03/2015, Until Discontinued, Print    potassium chloride SA (K-DUR,KLOR-CON) 20 MEQ tablet Take 0.5 tablets (10 mEq total) by mouth daily as needed (Take as needed when you take Lasix (furosemide).). Starting 08/27/15., Starting 12/03/2015, Until Discontinued, No Print      CONTINUE these medications which have NOT CHANGED   Details  albuterol (PROAIR HFA) 108 (90 BASE) MCG/ACT inhaler Inhale 2 puffs into the lungs every 6 (six) hours as needed. Shortness of  Breath, Starting 08/25/2015, Until Discontinued, Print    allopurinol (ZYLOPRIM) 300 MG tablet Take 1 tablet (300 mg total) by mouth daily., Starting 09/18/2015, Until Discontinued, Normal    cetirizine (ZYRTEC) 10 MG tablet Take 10 mg by mouth daily.  , Until Discontinued, Historical Med    DIGOX 125 MCG tablet TAKE ONE TABLET BY MOUTH DAILY, Normal    diphenhydrAMINE (BENADRYL) 25 MG tablet Take 25 mg by mouth every 6 (six) hours as needed for itching or allergies., Until Discontinued, Historical Med    ferrous sulfate 325 (65 FE) MG tablet Take 325 mg by mouth daily., Until Discontinued, Historical Med    fluticasone (FLONASE) 50 MCG/ACT nasal spray Place 2 sprays into both nostrils daily. , Starting 08/25/2015, Until Discontinued, Historical Med    folic acid (FOLVITE) 1 MG tablet Take 1 mg by mouth daily. , Until Discontinued, Historical Med    ipratropium-albuterol (DUONEB) 0.5-2.5 (3) MG/3ML SOLN Take 3 mLs by nebulization every 6 (six) hours as needed., Starting 09/18/2015, Until Discontinued, Normal    ketoconazole (NIZORAL) 2 % shampoo Apply 1 application topically 3 (three) times a week. , Starting 05/21/2013, Until Discontinued, Historical Med    levETIRAcetam (KEPPRA) 500 MG tablet Take 1 tablet (500 mg total) by mouth 2 (two)  times daily., Starting 10/21/2015, Until Discontinued, Normal    Omega-3 Fatty Acids (FISH OIL) 1000 MG CAPS Take 2 capsules by mouth 2 (two) times daily. , Until Discontinued, Historical Med    ondansetron (ZOFRAN) 8 MG tablet Take 1 tablet every 8 hours as needed for nausea/vomiting., Normal    predniSONE (DELTASONE) 20 MG tablet On Days 1-5 of chemo take 4.5 tablets (90mg ) daily., Print    prochlorperazine (COMPAZINE) 10 MG tablet Take 1 tablet (10 mg total) by mouth every 6 (six) hours as needed (Nausea or vomiting)., Starting 09/16/2015, Until Discontinued, Normal    Rivaroxaban (XARELTO STARTER PACK) 15 & 20 MG TBPK Take as directed on package: Start  with one 15mg  tablet by mouth twice a day with food. On Day 22, switch to one 20mg  tablet once a day with food., Print    Sennosides-Docusate Sodium (SENOKOT S PO) Take 1 tablet by mouth daily. , Until Discontinued, Historical Med    tadalafil (CIALIS) 5 MG tablet Take 5 mg by mouth daily as needed for erectile dysfunction. , Starting 05/18/2015, Until Discontinued, Historical Med    tamsulosin (FLOMAX) 0.4 MG CAPS capsule Take 1 capsule (0.4 mg total) by mouth at bedtime. For prostate treatment., Starting 05/25/2015, Until Discontinued, Print    Vitamin D, Ergocalciferol, (DRISDOL) 50000 UNITS CAPS capsule Take 1 capsule by mouth once a week. friday, Starting 04/27/2015, Until Discontinued, Historical Med    lidocaine-prilocaine (EMLA) cream Apply a quarter size amount to port site 1 hour prior to chemo. Do not rub in. Cover with plastic wrap., Normal       Allergies  Allergen Reactions  . Cymbalta [Duloxetine Hcl] Other (See Comments)    Confusion   . Procaine Hcl Hives and Other (See Comments)    NOVOCAINE: Sweating, Confusion, Not in right state of mind.  [name]    unknown   Follow-up Information    Follow up with Neil Crouch, PA-C On 01/21/2016.   Specialty:  Gastroenterology   Why:  at 10:30 am (gastroenterology PA).   Contact information:   [address] [place] [address] [phone]       Follow up with [facility].   Contact information:   [address] [place] [address] [phone]       Follow up with Molli Hazard, MD.   Specialties:  Hematology and Oncology, Oncology   Why:  Follow-up for chemotherapy when scheduled.   Contact information:   [place] [address] [phone]       Follow up with [name], MD In 1 week.   Specialty:  Family Medicine   Contact information:   [place]   [id number] [phone]        The results of significant diagnostics from this hospitalization (including imaging, microbiology, ancillary and laboratory) are listed below for reference.    Significant Diagnostic Studies: Ct Head Wo Contrast  11/28/2015  CLINICAL DATA:  Syncopal episodes over the last several days when standing. Generalized weakness. Fatigue and decreased appetite. Fall. The patient takes Xarelto. Initial encounter. EXAM: CT HEAD WITHOUT CONTRAST TECHNIQUE: Contiguous axial images were obtained from the base of the skull through the vertex without intravenous contrast. COMPARISON:  CT head without contrast 10/18/2015 FINDINGS: Remote lacunar infarcts of the left basal ganglia are stable. No acute cortical infarct, hemorrhage, or mass lesion is present. The basal ganglia are otherwise intact.  The insular ribbon is intact bilaterally. No focal cortical defects are evident. Mild generalized atrophy is present. Ventricles are proportionate to the degree of atrophy. The paranasal sinuses and mastoid air cells are clear. The calvarium is intact. No focal extracranial soft tissue lesions are present. Bilateral lens replacements are noted. The globes and orbits are otherwise intact. IMPRESSION: 1. No acute intracranial abnormality or significant interval change. 2. Stable atrophy and white matter disease. 3. 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 Electronically Signed   By: San Morelle M.D.   On: 11/28/2015 17:15   Dg Chest Port 1 View  11/28/2015  CLINICAL DATA:  Dyspnea.  History of asthma EXAM: PORTABLE CHEST 1 VIEW COMPARISON:  10/18/2015 FINDINGS: Right pleural effusion seen previously has resolved. There is chronic hyperinflation and interstitial coarsening consistent with COPD. There is no edema, consolidation, effusion, or pneumothorax. Left subclavian porta catheter, tip at the SVC origin. Stable heart size and aortic contours. IMPRESSION: COPD without acute  superimposed finding. Electronically Signed   By: Monte Fantasia M.D.   On: 11/28/2015 14:28    Microbiology: Recent Results (from the past 240 hour(s))  MRSA PCR Screening     Status: Abnormal   Collection Time: 11/28/15 10:28 PM  Result Value Ref Range Status   MRSA by PCR POSITIVE (A) NEGATIVE Final    Comment:        The GeneXpert MRSA Assay (FDA approved for NASAL specimens only), is one component of a comprehensive MRSA colonization surveillance program. It is not intended to diagnose MRSA infection nor to guide or monitor treatment for MRSA infections. RESULT CALLED TO, READ BACK BY AND VERIFIED WITH:  THOMAS,K @ 0200 ON 11/29/15 BY WOODIE,J      Labs: Basic Metabolic Panel:  Recent Labs Lab 11/28/15 1358 11/29/15 0920 12/01/15 0655  NA 138 138 139  K 3.6 3.4* 3.8  CL 97* 99* 109  CO2 33* 30 30  GLUCOSE 116* 101* 91  BUN 33* 24* 10  CREATININE 1.37* 1.19 1.01  CALCIUM 9.2 8.5* 8.2*   Liver Function Tests:  Recent Labs Lab 11/28/15 1358 11/29/15 0920 12/01/15 0655  AST 21 18 21   ALT 11* 10* 11*  ALKPHOS 58 49 51  BILITOT 1.0 1.1 1.1  PROT 6.3* 5.2* 5.2*  ALBUMIN 3.1* 2.6* 2.6*    Recent Labs Lab 11/28/15 1358  LIPASE 20   No results for input(s): AMMONIA in the last 168 hours. CBC:  Recent Labs Lab 11/28/15 1358 11/29/15 0025 11/29/15 0920 11/29/15 2214 12/01/15 0655 12/03/15 0636  WBC 6.8 5.2 5.2 6.9 5.3 4.5  NEUTROABS 4.6  --   --   --   --   --   HGB 10.4* 8.5* 8.4* 11.1* 10.4* 10.2*  HCT 30.7* 25.1* 25.2* 33.0* 31.5* 30.4*  MCV 93.6 93.7 95.1 92.7 94.9 94.1  PLT 113* 91* 84* 101* 90* 77*   Cardiac Enzymes:  Recent Labs Lab 11/28/15 1358 11/28/15 1835 11/29/15 0025 11/29/15 0920  TROPONINI 0.08* 0.08* 0.09* 0.08*   BNP: BNP (last 3 results)  Recent Labs  09/13/15 0650 09/29/15 1222 11/28/15 1357  BNP 595.0* 226.0* 144.0*    ProBNP (last 3 results) No results for input(s): PROBNP in the last 8760  hours.  CBG:  Recent Labs Lab 12/03/15 1144  GLUCAP 95       Signed:  Jamaria Amborn  [facility] 12/03/2015, 5:02 PM

## 2015-12-03 NOTE — Telephone Encounter (Signed)
APPOINTMENT MADE AND NURSE ON 300 WILL GIVE HIM DATE AND TIME AT DISCHARGE

## 2015-12-03 NOTE — Care Management Important Message (Signed)
Important Message  Patient Details  Name: [name] MRN: [id number] Date of Birth: 03-04-1931   Medicare Important Message Given:  Yes    Joylene Draft, RN 12/03/2015, 11:39 AM

## 2015-12-03 NOTE — Telephone Encounter (Signed)
Offer patient hospital follow in 6 weeks with Dr. Gala Romney, Vicente Males or me.

## 2015-12-03 NOTE — Progress Notes (Signed)
Diltiazem 30mg  held at 12:00PM. B/P 106/67, Pulse 86

## 2015-12-03 NOTE — Progress Notes (Signed)
Patient being transferred home via w/c to private vehicle accompanied by daughter.  While inquiring about his f/u appts with needed physicians, daughter states her older sister is responsible for making all appointments and would be making any future f/u appts. All physicians names and contact information have been given to the patient.  All questions and concerns have been addressed.  He is being discharged in stable condition.

## 2015-12-03 NOTE — Care Management Important Message (Signed)
Important Message  Patient Details  Name: [name] MRN: [id number] Date of Birth: 1931-11-27   Medicare Important Message Given:  Yes    Joylene Draft, RN 12/03/2015, 11:46 AM

## 2015-12-03 NOTE — Care Management Note (Signed)
Case Management Note  Patient Details  Name: [name] MRN: [id number] Date of Birth: [birth year]  Subjective/Objective:                    Action/Plan:   Expected Discharge Date:                  Expected Discharge Plan:  [place]  In-House Referral:  NA  Discharge planning Services  CM Consult  Post Acute Care Choice:  Resumption of Svcs/PTA Provider Choice offered to:  Patient  DME Arranged:    DME Agency:     HH Arranged:  RN [place] Agency:  [facility]  Status of Service:  Completed, signed off  Medicare Important Message Given:  Yes Date Medicare IM Given:    Medicare IM give by:    Date Additional Medicare IM Given:    Additional Medicare Important Message give by:     If discussed at [place] of Stay Meetings, dates discussed:    Additional Comments: Anticipate discharge home today with resumption of [facility] Rn (per pts choice). Romualdo Bolk of [facility] is aware and will collect the pts information from the chart. [facility] services to resume within 48 hours of discharge. No new DME needs noted. Pt and pts nurse aware of discharge arrangements. Christinia Gully [place], RN 12/03/2015, 11:46 AM

## 2015-12-07 ENCOUNTER — Encounter (HOSPITAL_COMMUNITY): Payer: Self-pay

## 2015-12-07 ENCOUNTER — Ambulatory Visit: Payer: Medicare Other | Admitting: Nurse Practitioner

## 2015-12-11 NOTE — Progress Notes (Signed)
This encounter was created in error - please disregard.

## 2015-12-14 ENCOUNTER — Telehealth (HOSPITAL_COMMUNITY): Payer: Self-pay | Admitting: *Deleted

## 2015-12-14 NOTE — Telephone Encounter (Signed)
Is he with Hospice?  He has no follow-up with [place].  TK

## 2015-12-16 ENCOUNTER — Encounter (HOSPITAL_COMMUNITY): Payer: Self-pay | Admitting: Lab

## 2015-12-16 NOTE — Telephone Encounter (Signed)
Unable to reach patient or family member by phone.  Messages have been left by no response to calls.  No show letter sent on 12/07/15 and no response has been received.

## 2015-12-16 NOTE — Progress Notes (Unsigned)
Refill request sent from [facility],  Dr will not fill due to no return visit and numbers called made and left messages for patient to call us.  Collie Siad and Myrka Sylva called numbers times.

## 2015-12-30 ENCOUNTER — Encounter (HOSPITAL_COMMUNITY): Payer: Self-pay | Admitting: Hematology & Oncology

## 2015-12-30 ENCOUNTER — Encounter (HOSPITAL_COMMUNITY): Payer: Medicare Other | Attending: Oncology | Admitting: Hematology & Oncology

## 2015-12-30 VITALS — BP 106/54 | HR 84 | Temp 97.5°F | Resp 18 | Wt 169.0 lb

## 2015-12-30 DIAGNOSIS — C8338 Diffuse large B-cell lymphoma, lymph nodes of multiple sites: Secondary | ICD-10-CM

## 2015-12-30 DIAGNOSIS — D696 Thrombocytopenia, unspecified: Secondary | ICD-10-CM

## 2015-12-30 DIAGNOSIS — D7589 Other specified diseases of blood and blood-forming organs: Secondary | ICD-10-CM

## 2015-12-30 DIAGNOSIS — R790 Abnormal level of blood mineral: Secondary | ICD-10-CM | POA: Insufficient documentation

## 2015-12-30 DIAGNOSIS — C8330 Diffuse large B-cell lymphoma, unspecified site: Secondary | ICD-10-CM | POA: Insufficient documentation

## 2015-12-30 DIAGNOSIS — D6862 Lupus anticoagulant syndrome: Secondary | ICD-10-CM | POA: Diagnosis not present

## 2015-12-30 DIAGNOSIS — R918 Other nonspecific abnormal finding of lung field: Secondary | ICD-10-CM | POA: Insufficient documentation

## 2015-12-30 DIAGNOSIS — R4182 Altered mental status, unspecified: Secondary | ICD-10-CM

## 2015-12-30 MED ORDER — OXYCODONE HCL 10 MG PO TABS: 10.0000 mg | ORAL_TABLET | Freq: Four times a day (QID) | ORAL | Status: DC | PRN

## 2015-12-30 NOTE — Progress Notes (Signed)
Tony Mustache, MD [place] [address]  DLBCL, Stage IV LDH 242 (3-23 U/L) IPI of 3 High -intermediate Risk with 5 years OS 43%, CR of 55% Rheumatoid arthritis, Enbrel and MTX for "many years" BMBX with suggestion of involvement with DLBCL    Diffuse large B cell lymphoma ([place])   08/22/2015 - 08/25/2015 Hospital Admission Acute respiratory distress   08/22/2015 Imaging CTA chest- Right infrahilar lymph node versus central lung mass measuring 2.8 x 4.1 cm. Worsening mediastinal adenopathy and bilateral hilar adenopathy as described with the largest node over the subcarinal region measuring 2.7 cm by short axis...   08/24/2015 Pathology Results Diagnosis PLEURAL FLUID, RIGHT(SPECIMEN 1 OF 1 COLLECTED 08/24/15): ATYPICAL LYMPHOCYTES SUSPICIOUS FOR A LYMPHOPROLIFERATIVE PROCESS, SEE COMMENT. Tony Males MD   08/25/2015 Imaging CT abd/pelvis- Mild upper retroperitoneal (gastrohepatic ligament and right retrocrural) lymphadenopathy. Cirrhosis.   09/03/2015 PET scan Widespread metastatic adenopathy including the RIGHT cervical lymph nodes, LEFT axillary lymph nodes, mediastinal lymph, hilar lymph node, retroperitoneal lymph nodes and iliac lymph nodes and inguinal lymph nodes. Inguinal lymph nodes may be most....   09/11/2015 Pathology Results Diagnosis Lymph node, biopsy, right inguinal - DIFFUSE LARGE B CELL LYMPHOMA.   09/11/2015 Pathology Results Interpretation Tissue-Flow Cytometry - MONOCLONAL B-CELL POPULATION IDENTIFIED. Diagnosis Comment: The findings are consistent with non-Hodgkin B-cell lymphoma. (BNS:ecj 09/16/2015)   09/13/2015 - 09/18/2015 Hospital Admission COPD exacerbation   09/18/2015 Bone Marrow Biopsy Bone Marrow, Aspirate,Biopsy, and Clot, left iliac crest: Despite limited findings, the features are worrisome for minimal involvement by B cell lymphoproliferative process, particularly given the previous history of large B cell lymphoma.    09/21/2015 -  10/11/2015 Chemotherapy Mini-R-CHOP   09/29/2015 - 10/02/2015 Hospital Admission SOB, anemia, dehydration, recurrent pleural effusion (right) Direct admit from [facility].  S/p 5 units of PRBCs, therapeutic thoracentesis on right relieveing 1.5 L of fluid, adjustment of pain medication.   10/12/2015 Treatment Plan Change Patient intolerant to R-mini-CHOP.  Trantition to palliative treatment with single-agent Rituxan.   10/12/2015 -  Chemotherapy Rituxan single-agent   10/18/2015 - 10/21/2015 Hospital Admission 1.   Acute encephalopathy 2.   New onset seizure disorder   10/19/2015 Imaging EEG- This recording is abnormal for the following reasons: Single epileptiform discharge involving the right anterior temporal area. This can correlates clinically with focal seizures.     CURRENT THERAPY: Observation, last treatment with single agent rituxan given on 11/02/2015  INTERVAL HISTORY:  [name] 82 y.o. male returns today for followup of stage IV DLBCL.  He received only one treatment of R mini CHOP.  He then received two additional cycles of single agent Rituxan with the last being given on 11/02/2015.   [name] returns to the [facility] today with his daughter. His entire demeanor is changed, relaxed and calm today, as opposed to the last visit when he was far more agitated. He now lives with one of his daughters.He was hospitalized on 12/3 and discharged on 12/8 secondary to rectal bleeding.  Over the holiday, he says he ate enough for two people, and he "thought that was good." He currently lives with his daughter and says she "hasn't run me off yet." He's been doing all right living with her and she's doing alright taking care of him. She says "he has his days."  In general, he says he can do almost everything by himself. His daughter confirms this, and adds that over the last 2.5-3 weeks, "his  whole outlook and everything has chanegd. He's more alert, he can get up better by himself, his  appetite has improved a lot, he's not as snappy." Both [name] and his daughter confirm that his mood is better. He says, "I'm different." When asked "what changed for him" he says "I don't know," but he does confirm that he feels more accepting of being sick and admits that was hard for a while. With regards to this, he says "it wasn't supposed to happen that way."  His daughter says that she thinks his mood change has to do with the fact that some of the chemo he was on has worn off. She remarks that the difference is like "night and day," because when he was on the chemo, "he would lay there for days, wouldn't eat, wouldn't drink;."  In terms of the trajectory of his illness, [name] says he wants to "ride it out until the end." He says that he wants to get back on chemo and take it as far as it helps, "and then get the heck off of it." Generally, he says "anything sounds like a good idea," but he agrees that the chemo path that he was on originally was not a good path.  He is still avoidant of changing his code status. He says that he "thinks about it a lot, but doesn't do a lot of talking about it." He says he needs to take his daughter out to lunch on a day off and talk about it since it's "serious stuff." During the appointment, he asks her if she's off next Friday, and they'll get lunch.  He badly wants to drive, but this is a danger due to his weakness, and the fact that he has seizures. His daughter says that [name] has not had a seizure over the past week, but that the week before last, she witnessed one. Regarding the nature of his seizures, she says "he will be sitting there, talking mid-sentence, and he'll stop. He stares off, but he has a little jerk to him." She confirms that he's confused afterwards. She states that the confusion after the episode lasts a good 30-45 minutes. She says she can be talking to him during this time and he doesn't have a clue what she's saying. His daughter  reports that he has not urinated during the last couple of seizures.  [name] himself admits that he gets confused sometimes. He says "a mind is a hell of a thing to waste, especially if it's yours." His daughter adds to this, stating that, one day, they will discuss something, and then the next day, he forgets they ever discussed it.  Past Medical History  Diagnosis Date  . COPD (chronic obstructive pulmonary disease) (HCC)     Uses occasional nighttime O2  . Disseminated herpes zoster 2010  . GERD (gastroesophageal reflux disease)   . Asthma   . Pulmonary embolus ([place])     2003 and 2011  . Pulmonary nodules     Chest CT 09/11  . Mixed hyperlipidemia   . Chronic atrial fibrillation ([place])   . Lupus anticoagulant positive   . Essential hypertension, benign   . Cholelithiasis   . Rheumatoid arthritis(714.0)   . History of chicken pox 1941; 2011  . Anemia   . Chronic lower back pain   . Chronic diastolic heart failure ([place])   . Cirrhosis ([place])     Questionable, AFP normal Feb 01/2012, hx of ETOH use   .  Headache(784.0)   . Cervical spondylosis without myelopathy   . Stage III chronic kidney disease   . Cellulitis of leg, left 01/03/2015  . Aortic stenosis, moderate 05/21/2015  . Dysrhythmia     chronic AFib  . Diffuse large B cell lymphoma ([place]) 08/22/2015  . DNR (do not resuscitate) 11/01/2015  . Seizure disorder ([place]) 09/2015  . Rectal ulcer 12/01/2015  . Telangiectasia 11/29/2015    ANTRUM AND JEJUNUM    has Essential hypertension, benign; PULMONARY EMBOLISM; Chronic atrial fibrillation ([place]); Rheumatoid arthritis ([place]); Lupus anticoagulant positive; GERD (gastroesophageal reflux disease); Cirrhosis ([place]); COPD (chronic obstructive pulmonary disease) ([place]); Long term current use of anticoagulant; Arthritis; Anemia; Adenomatous polyps; Chronic diastolic heart failure ([place]); Headache(784.0); Cervical spondylosis without myelopathy; Stiffness of joints, not elsewhere classified,  multiple sites; Posture imbalance; Cellulitis of leg, left; Acute kidney injury ([place]); Thrombocytopenia ([place]); Stage III chronic kidney disease; Acute on chronic diastolic CHF (congestive heart failure) ([place]); COPD exacerbation ([place]); Aortic stenosis, moderate; Acute diastolic CHF (congestive heart failure) ([place]); Acute respiratory distress ([place]); Diffuse large B cell lymphoma ([place]); Chronic a-fib (HCC); BPH (benign prostatic hyperplasia); Atrial fibrillation with RVR ([place]); HCAP (healthcare-associated pneumonia); COPD with acute exacerbation ([place]); Recurrent right pleural effusion; Chronic diastolic CHF (congestive heart failure) ([place]); Chronic respiratory failure ([place]); Malignant pleural effusion; Altered mental state; Acute encephalopathy; Chronic respiratory failure with hypoxia ([place]); Leukocytosis; Seroma; Cellulitis of groin; Altered mental status; Back pain; Status post thoracentesis; DNR (do not resuscitate); Syncope and collapse; AKI (acute kidney injury) ([place]); Dehydration; History of pulmonary embolism; Elevated troponin I level; Normocytic anemia; GI bleed; Epigastric abdominal pain; Rectal ulcer; and Telangiectasia on his problem list.     is allergic to cymbalta; procaine hcl; and bystolic.  Current Outpatient Prescriptions on File Prior to Visit  Medication Sig Dispense Refill  . albuterol (PROAIR HFA) 108 (90 BASE) MCG/ACT inhaler Inhale 2 puffs into the lungs every 6 (six) hours as needed. Shortness of Breath 1 Inhaler 3  . allopurinol (ZYLOPRIM) 300 MG tablet Take 1 tablet (300 mg total) by mouth daily. 30 tablet 3  . cetirizine (ZYRTEC) 10 MG tablet Take 10 mg by mouth daily.      Marland Kitchen DIGOX 125 MCG tablet TAKE ONE TABLET BY MOUTH DAILY 30 tablet 6  . diltiazem (CARDIZEM CD) 120 MG 24 hr capsule Take 1 capsule (120 mg total) by mouth daily. 30 capsule 3  . diphenhydrAMINE (BENADRYL) 25 MG tablet Take 25 mg by mouth every 6 (six) hours as needed for itching or allergies.    . ferrous  sulfate 325 (65 FE) MG tablet Take 325 mg by mouth daily.    . fluticasone (FLONASE) 50 MCG/ACT nasal spray Place 2 sprays into both nostrils daily.   0  . furosemide (LASIX) 40 MG tablet Take 1 tablet (40 mg total) by mouth daily as needed for fluid (For fluid buildup in your lungs and legs.). Take daily for prevention of fluid buildup.    . gabapentin (NEURONTIN) 300 MG capsule Take 1 capsule (300 mg total) by mouth 2 (two) times daily.    Marland Kitchen ipratropium-albuterol (DUONEB) 0.5-2.5 (3) MG/3ML SOLN Take 3 mLs by nebulization every 6 (six) hours as needed. (Patient taking differently: Take 3 mLs by nebulization every 6 (six) hours as needed (shortness of breath). ) 360 mL 1  . ketoconazole (NIZORAL) 2 % shampoo Apply 1 application topically 3 (three) times a week.     . levETIRAcetam (KEPPRA) 500 MG tablet Take 1 tablet (500 mg total)  by mouth 2 (two) times daily. 60 tablet 0  . lidocaine-prilocaine (EMLA) cream Apply a quarter size amount to port site 1 hour prior to chemo. Do not rub in. Cover with plastic wrap. 30 g 3  . methocarbamol (ROBAXIN) 500 MG tablet Take 1 tablet (500 mg total) by mouth every 8 (eight) hours as needed for muscle spasms. 30 tablet 0  . Omega-3 Fatty Acids (FISH OIL) 1000 MG CAPS Take 2 capsules by mouth 2 (two) times daily.     Marland Kitchen omeprazole (PRILOSEC) 40 MG capsule Take 1 capsule (40 mg total) by mouth 2 (two) times daily. 60 capsule 3  . ondansetron (ZOFRAN) 8 MG tablet Take 1 tablet every 8 hours as needed for nausea/vomiting. (Patient taking differently: Take 8 mg by mouth every 8 (eight) hours as needed for nausea or vomiting. ) 30 tablet 3  . potassium chloride SA (K-DUR,KLOR-CON) 20 MEQ tablet Take 0.5 tablets (10 mEq total) by mouth daily as needed (Take as needed when you take Lasix (furosemide).). Starting 08/27/15.    Marland Kitchen predniSONE (DELTASONE) 20 MG tablet On Days 1-5 of chemo take 4.5 tablets (89m) daily. 30 tablet 4  . prochlorperazine (COMPAZINE) 10 MG tablet Take 1  tablet (10 mg total) by mouth every 6 (six) hours as needed (Nausea or vomiting). (Patient taking differently: Take 10 mg by mouth every 6 (six) hours as needed (Nausea or vomiting). nausea) 30 tablet 3  . Sennosides-Docusate Sodium (SENOKOT S PO) Take 1 tablet by mouth daily.     . tadalafil (CIALIS) 5 MG tablet Take 5 mg by mouth daily as needed for erectile dysfunction.     . tamsulosin (FLOMAX) 0.4 MG CAPS capsule Take 1 capsule (0.4 mg total) by mouth at bedtime. For prostate treatment. 30 capsule 3  . Vitamin D, Ergocalciferol, (DRISDOL) 50000 UNITS CAPS capsule Take 1 capsule by mouth once a week. friday  4  . folic acid (FOLVITE) 1 MG tablet Take 1 mg by mouth daily.      No current facility-administered medications on file prior to visit.    Past Surgical History  Procedure Laterality Date  . Polypectomy  02/02/2012    RMR: Multiple colonic polyps removed, flat, tubular adenomas/ Left-sided diverticulosis  . Posterior lumbar vertebrae excision  2003; 2009; 2011  . Myringotomy      "3 times; both ears"  . Cataract extraction w/ intraocular lens  implant, bilateral    . Cholecystectomy  05/23/2012    Procedure: LAPAROSCOPIC CHOLECYSTECTOMY WITH INTRAOPERATIVE CHOLANGIOGRAM;  Surgeon: FStark Klein MD;  [facility]  Service: General;  Laterality: N/A;  . Esophagogastroduodenoscopy  02/02/12    RQZE:SPQZRAQTsize hiatal hernia; otherwise normal exam  . Colonoscopy  01/24/2013    RMAU:QJFHLKTpolyps/colonic diverticulosis  . Back surgery    . Portacath placement    . Lymph node biopsy    . Lymph node biopsy Right 09/11/2015    Procedure: RIGHT INGUINAL LYMPH NODE BIOPSY;  Surgeon: MAviva Signs MD;  Location: [facility];  Service: General;  Laterality: Right;  . Portacath placement Left 09/11/2015    Procedure: INSERTION PORT-A-CATH;  Surgeon: MAviva Signs MD;  Location: [facility];  Service: General;  Laterality: Left;  . Esophagogastroduodenoscopy N/A 11/29/2015    SGYB:WLSLHTDS diverticulosis/small internal hemorrhoids/left colon is redundant  . Colonoscopy N/A 12/01/2015    Procedure: COLONOSCOPY;  Surgeon: SDanie Binder MD;  [facility];  Service: Endoscopy;  Laterality: N/A;    Denies any headaches, dizziness,  double vision, fevers, chills, night sweats, nausea, vomiting, diarrhea, constipation, chest pain, heart palpitations, shortness of breath, blood in stool, black tarry stool, urinary pain, urinary burning, urinary frequency, hematuria. 14 point review of systems was performed and is negative except as detailed under history of present illness and above    PHYSICAL EXAMINATION  ECOG PERFORMANCE STATUS: 1 - Symptomatic but completely ambulatory  Filed Vitals:   12/30/15 1300  BP: 106/54  Pulse: 84  Temp: 97.5 F (36.4 C)  Resp: 18    GENERAL:alert, no distress, well nourished, well developed, comfortable, cooperative, and accompanied by daughter. Wearing O2. In wheelchair SKIN: skin color, texture, turgor are normal, no rashes or significant lesions HEAD: Normocephalic, No masses, lesions, tenderness or abnormalities EYES: normal, PERRLA, EOMI, Conjunctiva are pink and non-injected EARS: External ears normal OROPHARYNX:lips, buccal mucosa, and tongue normal and mucous membranes are moist  NECK: supple, thyroid normal size, non-tender, without nodularity, no stridor, non-tender, trachea midline LYMPH:  Right lower anterior neck adenopathy improved, axillary adenopathy on right not palpable. BREAST:not examined LUNGS: right lower lobe decreased breath sounds, otherwise clear. HEART: irregularly irregular rate & rhythm, no murmurs and no gallops ABDOMEN:abdomen soft and normal bowel sounds BACK: Back symmetric, no curvature. EXTREMITIES:less then 2 second capillary refill, no joint deformities, effusion, or inflammation, no skin discoloration, no cyanosis  NEURO: alert & oriented x 3 with fluent speech, no focal motor/sensory deficits,  gait normal   LABORATORY DATA: I have reviewed the data as listed  CBC    Component Value Date/Time   WBC 4.5 12/03/2015 0636   RBC 3.23* 12/03/2015 0636   HGB 10.2* 12/03/2015 0636   HCT 30.4* 12/03/2015 0636   HCT 34 01/11/2012 1007   PLT 77* 12/03/2015 0636   MCV 94.1 12/03/2015 0636   MCV 92.0 01/11/2012 1007   MCH 31.6 12/03/2015 0636   MCHC 33.6 12/03/2015 0636   RDW 15.5 12/03/2015 0636   LYMPHSABS 1.1 11/28/2015 1358   MONOABS 0.7 11/28/2015 1358   EOSABS 0.3 11/28/2015 1358   BASOSABS 0.0 11/28/2015 1358      Chemistry      Component Value Date/Time   NA 139 12/01/2015 0655   NA 140 01/11/2012 1005   K 3.8 12/01/2015 0655   K 4.5 01/11/2012 1005   CL 109 12/01/2015 0655   CO2 30 12/01/2015 0655   BUN 10 12/01/2015 0655   BUN 12 01/11/2012 1005   CREATININE 1.01 12/01/2015 0655   CREATININE 1.17 01/11/2012 1005      Component Value Date/Time   CALCIUM 8.2* 12/01/2015 0655   CALCIUM 9.2 01/11/2012 1005   ALKPHOS 51 12/01/2015 0655   AST 21 12/01/2015 0655   ALT 11* 12/01/2015 0655   BILITOT 1.1 12/01/2015 0655       RADIOGRAPHIC STUDIES:   CLINICAL DATA: Dyspnea. History of asthma  EXAM: PORTABLE CHEST 1 VIEW  COMPARISON: 10/18/2015  FINDINGS: Right pleural effusion seen previously has resolved.  There is chronic hyperinflation and interstitial coarsening consistent with COPD. There is no edema, consolidation, effusion, or pneumothorax.  Left subclavian porta catheter, tip at the SVC origin.  Stable heart size and aortic contours.  IMPRESSION: COPD without acute superimposed finding.   Electronically Signed  By: Monte Fantasia M.D.  On: 11/28/2015 14:28     PATHOLOGY:  Diagnosis PLEURAL FLUID, RIGHT(SPECIMEN 1 OF 1 COLLECTED 08/24/15): ATYPICAL LYMPHOCYTES SUSPICIOUS FOR A LYMPHOPROLIFERATIVE PROCESS, SEE COMMENT. Tony Males MD Pathologist, Electronic Signature (Case signed 08/28/2015)  ASSESSMENT AND PLAN:  DLBCL, Stage IV LDH 242 (3-23 U/L) IPI of 3 High -intermediate Risk with 5 years OS 43%, CR of 55% Rheumatoid arthritis, Enbrel and MTX for "many years" PET/CT with widespread metastatic adenopathy including the right cervical lymph nodes, left axillary lymph nodes, mediastinal lymph nodes, hilar, retroperitoneal lymph nodes and iliac lymph nodes. Inguinal lymph nodes may be most accessible for biopsy. Hypermetabolic pleural thickening in discrete nodules within the lungs consistent with metastatic disease, right infrahilar mass versus adenopathy, hypermetabolic thickening through the gastric antrum/pyloric region, moderate right effusion Macrocytosis  Lupus Anticoagulant BMBX worrisome for minimal involvement with DLBCL Thrombocytopenia  Currently he is doing ok. We discussed observation. I have recommended follow-up in several weeks. If at some point he needs additional therapy we could consider Revlimid. We also addressed end of life issues and the patient notes he has not openly discussed these with his family. This continues to be an ongoing issue for him. He fully understands that he could not tolerate therapy. He notes he even had difficulty with single agent Rituxan. Currently CXR looks improved.   I have addressed hospice multiple times, he is just not willing to consider it.  He needs a refill on his percocet today. His daughter already got a refill of Xarelto from the doctor in [place].  He is insistent on driving. We discussed this in detail given his seizures.   I again addressed his BMBX results and discussed the significance of this with his daughter she understands and we have opted to not pursue any additional therapy at this point.  The patient agrees to return in several weeks.  All questions were answered. The patient knows to call the clinic with any problems, questions or concerns. We can certainly see the patient much sooner if necessary.  This  document serves as a record of services personally performed by Ancil Linsey, MD. It was created on her behalf by Toni Amend, a trained medical scribe. The creation of this record is based on the scribe's personal observations and the provider's statements to them. This document has been checked and approved by the attending provider.  I have reviewed the above documentation for accuracy and completeness, and I agree with the above.  This note is electronically signed [id number],[email or web address] Cyril Mourning, MD  12/30/2015 2:02 PM

## 2015-12-30 NOTE — Patient Instructions (Addendum)
[place] at [facility] Discharge Instructions  RECOMMENDATIONS MADE BY THE CONSULTANT AND ANY TEST RESULTS WILL BE SENT TO YOUR REFERRING PHYSICIAN.   Exam completed by Dr Whitney Muse today DO NOT DRIVE  Could possibly take revlimid if you want to, we can talk about it when you come back the next time  Return to see the doctor in 4 weeks  Please let us know if anything changes before you come back in 4 weeks  Please call the clinic if you have any questions or concerns    Thank you for choosing [place] at [facility] to provide your oncology and hematology care.  To afford each patient quality time with our provider, please arrive at least 15 minutes before your scheduled appointment time.    You need to re-schedule your appointment should you arrive 10 or more minutes late.  We strive to give you quality time with our providers, and arriving late affects you and other patients whose appointments are after yours.  Also, if you no show three or more times for appointments you may be dismissed from the clinic at the providers discretion.     Again, thank you for choosing [facility].  Our hope is that these requests will decrease the amount of time that you wait before being seen by our physicians.       _____________________________________________________________  Should you have questions after your visit to [facility], please contact our office at [phone] between the hours of 8:30 a.m. and 4:30 p.m.  Voicemails left after 4:30 p.m. will not be returned until the following business day.  For prescription refill requests, have your pharmacy contact our office.      Lenalidomide Oral Capsules What is this medicine? LENALIDOMIDE (len a LID oh mide) is a chemotherapy drug that targets specific proteins within cancer cells and stops the cancer cell from growing. It is used to treat multiple myeloma, mantle cell  lymphoma, and some myelodysplastic syndromes that cause severe anemia requiring blood transfusions. This medicine may be used for other purposes; ask your health care provider or pharmacist if you have questions. What should I tell my health care provider before I take this medicine? They need to know if you have any of these conditions: -blood clots in the legs or the lungs -high blood pressure -high cholesterol -infection -irregular monthly periods or menstrual cycles -kidney disease -liver disease -smoke tobacco -thyroid disease -an unusual or allergic reaction to lenalidomide, other medicines, foods, dyes, or preservatives -pregnant or trying to get pregnant -breast-feeding How should I use this medicine? Take this medicine by mouth with a glass of water. Follow the directions on the prescription label. Do not cut, crush, or chew this medicine. Take your medicine at regular intervals. Do not take it more often than directed. Do not stop taking except on your doctor's advice. A MedGuide will be given with each prescription and refill. Read this guide carefully each time. The MedGuide may change frequently. Talk to your pediatrician regarding the use of this medicine in children. Special care may be needed. Overdosage: If you think you have taken too much of this medicine contact a poison control center or emergency room at once. NOTE: This medicine is only for you. Do not share this medicine with others. What if I miss a dose? If you miss a dose, take it as soon as you can. If your next dose is to  be taken in less than 12 hours, then do not take the missed dose. Take the next dose at your regular time. Do not take double or extra doses. What may interact with this medicine? This medicine may interact with the following medications: -digoxin -medicines that increase the risk of thrombosis like estrogens or erythropoietic agents (e.g., epoetin alfa and darbepoetin alfa) -warfarin This  list may not describe all possible interactions. Give your health care provider a list of all the medicines, herbs, non-prescription drugs, or dietary supplements you use. Also tell them if you smoke, drink alcohol, or use illegal drugs. Some items may interact with your medicine. What should I watch for while using this medicine? Visit your doctor for regular check ups. Tell your doctor or healthcare professional if your symptoms do not start to get better or if they get worse. You will need to have important blood work done while you are taking this medicine. This medicine is available only through a special program. Doctors, pharmacies, and patients must meet all of the conditions of the program. Your health care provider will help you get signed up with the program if you need this medicine. Through the program you will only receive up to a 28 day supply of the medicine at one time. You will need a new prescription for each refill. This medicine can cause birth defects. Do not get pregnant while taking this drug. Females with child-bearing potential will need to have 2 negative pregnancy tests before starting this medicine. Pregnancy testing must be done every 2 to 4 weeks as directed while taking this medicine. Use 2 reliable forms of birth control together while you are taking this medicine and for 1 month after you stop taking this medicine. If you think that you might be pregnant talk to your doctor right away. Men must use a latex condom during sexual contact with a woman while taking this medicine and for 28 days after you stop taking this medicine. A latex condom is needed even if you have had a vasectomy. Contact your doctor right away if your partner becomes pregnant. Do not donate sperm while taking this medicine and for 28 days after you stop taking this medicine. Do not give blood while taking the medicine and for 1 month after completion of treatment to avoid exposing pregnant women to the  medicine through the donated blood. Talk to your doctor about your risk of cancer. You may be more at risk for certain types of cancers if you take this medicine. What side effects may I notice from receiving this medicine? Side effects that you should report to your doctor or health care professional as soon as possible: -allergic reactions like skin rash, itching or hives, swelling of the face, lips, or tongue -breathing problems -chest pain or tightness -fast, irregular heartbeat -low blood counts - this medicine may decrease the number of white blood cells, red blood cells and platelets. You may be at increased risk for infections and bleeding. -seizures -signs and symptoms of bleeding such as bloody or black, tarry stools; red or dark-brown urine; spitting up blood or brown material that looks like coffee grounds; red spots on the skin; unusual bruising or bleeding from the eye, gums, or nose -signs and symptoms of a blood clot such as breathing problems; changes in vision; chest pain; severe, sudden headache; pain, swelling, warmth in the leg; trouble speaking; sudden numbness or weakness of the face, arm or leg -signs and symptoms of liver injury like  dark yellow or brown urine; general ill feeling or flu-like symptoms; light-colored stools; loss of appetite; nausea; right upper belly pain; unusually weak or tired; yellowing of the eyes or skin -signs and symptoms of a stroke like changes in vision; confusion; trouble speaking or understanding; severe headaches; sudden numbness or weakness of the face, arm or leg; trouble walking; dizziness; loss of balance or coordination -sweating -vomiting Side effects that usually do not require medical attention (report to your doctor or health care professional if they continue or are bothersome): -constipation -cough -diarrhea -tiredness This list may not describe all possible side effects. Call your doctor for medical advice about side effects.  You may report side effects to FDA at 1-800-FDA-1088. Where should I keep my medicine? Keep out of the reach of children. Store at room temperature between 15 and 30 degrees C (59 and 86 degrees F). Throw away any unused medicine after the expiration date. NOTE: This sheet is a summary. It may not cover all possible information. If you have questions about this medicine, talk to your doctor, pharmacist, or health care provider.    2016, Elsevier/Gold Standard. (2014-03-18 18:30:01)

## 2016-01-13 ENCOUNTER — Telehealth (HOSPITAL_COMMUNITY): Payer: Self-pay | Admitting: *Deleted

## 2016-01-14 ENCOUNTER — Other Ambulatory Visit (HOSPITAL_COMMUNITY): Payer: Self-pay | Admitting: *Deleted

## 2016-01-14 DIAGNOSIS — C8338 Diffuse large B-cell lymphoma, lymph nodes of multiple sites: Secondary | ICD-10-CM

## 2016-01-14 MED ORDER — OXYCODONE HCL 10 MG PO TABS: 10.0000 mg | ORAL_TABLET | Freq: Four times a day (QID) | ORAL | Status: DC | PRN

## 2016-01-21 ENCOUNTER — Ambulatory Visit: Payer: Medicare Other | Admitting: Gastroenterology

## 2016-01-21 ENCOUNTER — Encounter: Payer: Self-pay | Admitting: Gastroenterology

## 2016-01-21 ENCOUNTER — Telehealth: Payer: Self-pay | Admitting: Gastroenterology

## 2016-01-21 NOTE — Telephone Encounter (Signed)
PATIENT WAS A NO SHOW AND LETTER SENT  °

## 2016-01-22 ENCOUNTER — Other Ambulatory Visit (HOSPITAL_COMMUNITY): Payer: Self-pay | Admitting: Oncology

## 2016-01-22 ENCOUNTER — Telehealth (HOSPITAL_COMMUNITY): Payer: Self-pay | Admitting: *Deleted

## 2016-01-22 ENCOUNTER — Other Ambulatory Visit (HOSPITAL_COMMUNITY): Payer: Self-pay | Admitting: Hematology & Oncology

## 2016-01-22 DIAGNOSIS — C8338 Diffuse large B-cell lymphoma, lymph nodes of multiple sites: Secondary | ICD-10-CM

## 2016-01-22 MED ORDER — OXYCODONE HCL 10 MG PO TABS: 10.0000 mg | ORAL_TABLET | Freq: Four times a day (QID) | ORAL | Status: DC | PRN

## 2016-01-29 ENCOUNTER — Ambulatory Visit (HOSPITAL_COMMUNITY): Payer: Medicare Other | Admitting: Hematology & Oncology

## 2016-01-29 ENCOUNTER — Encounter (HOSPITAL_COMMUNITY): Payer: Medicare Other

## 2016-01-29 NOTE — Progress Notes (Signed)
This encounter was created in error - please disregard.

## 2016-03-25 ENCOUNTER — Telehealth (HOSPITAL_COMMUNITY): Payer: Self-pay | Admitting: *Deleted

## 2016-03-25 ENCOUNTER — Other Ambulatory Visit (HOSPITAL_COMMUNITY): Payer: Self-pay | Admitting: Oncology

## 2016-03-25 DIAGNOSIS — C8338 Diffuse large B-cell lymphoma, lymph nodes of multiple sites: Secondary | ICD-10-CM

## 2016-03-25 MED ORDER — OXYCODONE HCL 10 MG PO TABS: 10.0000 mg | ORAL_TABLET | Freq: Four times a day (QID) | ORAL | Status: DC | PRN

## 2016-03-29 ENCOUNTER — Other Ambulatory Visit: Payer: Self-pay

## 2016-05-07 ENCOUNTER — Other Ambulatory Visit: Payer: Self-pay | Admitting: Cardiology

## 2016-05-16 ENCOUNTER — Other Ambulatory Visit (HOSPITAL_COMMUNITY): Payer: Self-pay | Admitting: Oncology

## 2016-05-16 ENCOUNTER — Telehealth (HOSPITAL_COMMUNITY): Payer: Self-pay | Admitting: *Deleted

## 2016-05-16 DIAGNOSIS — C8338 Diffuse large B-cell lymphoma, lymph nodes of multiple sites: Secondary | ICD-10-CM

## 2016-05-16 MED ORDER — OXYCODONE HCL 10 MG PO TABS: 10.0000 mg | ORAL_TABLET | Freq: Four times a day (QID) | ORAL | Status: DC | PRN

## 2016-06-24 ENCOUNTER — Telehealth (HOSPITAL_COMMUNITY): Payer: Self-pay | Admitting: *Deleted

## 2016-06-24 ENCOUNTER — Other Ambulatory Visit (HOSPITAL_COMMUNITY): Payer: Self-pay | Admitting: Oncology

## 2016-06-24 DIAGNOSIS — C8338 Diffuse large B-cell lymphoma, lymph nodes of multiple sites: Secondary | ICD-10-CM

## 2016-06-24 MED ORDER — OXYCODONE HCL 10 MG PO TABS: 10.0000 mg | ORAL_TABLET | Freq: Four times a day (QID) | ORAL | Status: DC | PRN

## 2016-07-05 ENCOUNTER — Encounter (HOSPITAL_COMMUNITY): Payer: Medicare Other

## 2016-07-05 ENCOUNTER — Encounter (HOSPITAL_COMMUNITY): Payer: Medicare Other | Attending: Hematology & Oncology | Admitting: Hematology & Oncology

## 2016-07-05 VITALS — BP 96/44 | HR 69 | Temp 97.6°F | Resp 16

## 2016-07-05 DIAGNOSIS — D696 Thrombocytopenia, unspecified: Secondary | ICD-10-CM | POA: Diagnosis not present

## 2016-07-05 DIAGNOSIS — C8330 Diffuse large B-cell lymphoma, unspecified site: Secondary | ICD-10-CM

## 2016-07-05 DIAGNOSIS — R0602 Shortness of breath: Secondary | ICD-10-CM

## 2016-07-05 DIAGNOSIS — C8338 Diffuse large B-cell lymphoma, lymph nodes of multiple sites: Secondary | ICD-10-CM

## 2016-07-05 DIAGNOSIS — D6862 Lupus anticoagulant syndrome: Secondary | ICD-10-CM

## 2016-07-05 DIAGNOSIS — M069 Rheumatoid arthritis, unspecified: Secondary | ICD-10-CM

## 2016-07-05 DIAGNOSIS — D7589 Other specified diseases of blood and blood-forming organs: Secondary | ICD-10-CM | POA: Diagnosis not present

## 2016-07-05 MED ORDER — HEPARIN SOD (PORK) LOCK FLUSH 100 UNIT/ML IV SOLN
500.0000 [IU] | Freq: Once | INTRAVENOUS | Status: AC
  Administered 2016-07-05: 500 [IU] via INTRAVENOUS

## 2016-07-05 MED ORDER — HEPARIN SOD (PORK) LOCK FLUSH 100 UNIT/ML IV SOLN
INTRAVENOUS | Status: AC
  Filled 2016-07-05: qty 5

## 2016-07-05 MED ORDER — SODIUM CHLORIDE 0.9% FLUSH
10.0000 mL | Freq: Once | INTRAVENOUS | Status: DC
Start: 2016-07-05 — End: 2016-08-04

## 2016-07-05 NOTE — Progress Notes (Signed)
[name] presented for Portacath access and flush. Proper placement of portacath confirmed by CXR. Portacath located rt chest wall accessed with  H 20 needle. Good blood return present. Portacath flushed with 72ml NS and 500U/35ml Heparin and needle removed intact. Procedure without incident. Patient tolerated procedure well.

## 2016-07-05 NOTE — Patient Instructions (Signed)
[place] at [facility] Discharge Instructions  RECOMMENDATIONS MADE BY THE CONSULTANT AND ANY TEST RESULTS WILL BE SENT TO YOUR REFERRING PHYSICIAN.  Exam done and seen today by Dr. Whitney Muse Will look at labs from dr nylands office Will need to get PET and CT scans done Return to see the doctor after scans Please call the clinic if you have any questions or concerns  Thank you for choosing [place] at [facility] to provide your oncology and hematology care.  To afford each patient quality time with our provider, please arrive at least 15 minutes before your scheduled appointment time.   Beginning January 23rd 2017 lab work for the [facility] will be done in the  Main lab at [facility] on 1st floor. If you have a lab appointment with the [place] [facility] please come in thru the  Main Entrance and check in at the main information desk  You need to re-schedule your appointment should you arrive 10 or more minutes late.  We strive to give you quality time with our providers, and arriving late affects you and other patients whose appointments are after yours.  Also, if you no show three or more times for appointments you may be dismissed from the clinic at the providers discretion.     Again, thank you for choosing [facility].  Our hope is that these requests will decrease the amount of time that you wait before being seen by our physicians.       _____________________________________________________________  Should you have questions after your visit to [facility], please contact our office at [phone] between the hours of 8:30 a.m. and 4:30 p.m.  Voicemails left after 4:30 p.m. will not be returned until the following business day.  For prescription refill requests, have your pharmacy contact our office.         Resources For Cancer Patients and their Caregivers ? American Cancer Society: Can assist with  transportation, wigs, general needs, runs Look Good Feel Better.        820-120-5378 ? Cancer Care: Provides financial assistance, online support groups, medication/co-pay assistance.  1-800-813-HOPE 862-743-1874) ? [place] Assists [place] cancer patients and their families through emotional , educational and financial support.  636-409-5088 ? [place] DSS Where to apply for food stamps, Medicaid and utility assistance. 904 663 3448 ? [facility]: Transportation to medical appointments. (807)531-5846 ? Social Security Administration: May apply for disability if have a Stage IV cancer. 419 081 0137 6260034021 ? [employer], Disability and Transit Services: Assists with nutrition, care and transit needs. [place] Support Programs: @10RELATIVEDAYS @ > Cancer Support Group  2nd Tuesday of the month 1pm-2pm, Journey Room  > Creative Journey  3rd Tuesday of the month 1130am-1pm, Journey Room  > Look Good Feel Better  1st Wednesday of the month 10am-12 noon, Journey Room (Call [place] to register [phone])

## 2016-07-05 NOTE — Progress Notes (Signed)
[name] presented for Portacath access and flush. Proper placement of portacath confirmed by CXR. Portacath located left chest wall accessed with  H 20 needle. Good blood return present. Portacath flushed with 8ml NS and 500U/34ml Heparin and needle removed intact. Procedure without incident. Patient tolerated procedure well.

## 2016-07-06 ENCOUNTER — Other Ambulatory Visit: Payer: Self-pay | Admitting: Cardiology

## 2016-07-15 ENCOUNTER — Encounter (HOSPITAL_COMMUNITY)
Admission: RE | Admit: 2016-07-15 | Discharge: 2016-07-15 | Disposition: A | Discharge status: Home / Self Care | Payer: Medicare Other | Source: Ambulatory Visit | Attending: Hematology & Oncology | Admitting: Hematology & Oncology

## 2016-07-15 DIAGNOSIS — C8338 Diffuse large B-cell lymphoma, lymph nodes of multiple sites: Secondary | ICD-10-CM | POA: Insufficient documentation

## 2016-07-15 LAB — GLUCOSE, CAPILLARY: Glucose-Capillary: 98 mg/dL (ref 65–99)

## 2016-07-15 MED ORDER — FLUDEOXYGLUCOSE F - 18 (FDG) INJECTION
9.4100 | Freq: Once | INTRAVENOUS | Status: AC | PRN
  Administered 2016-07-15: 9.41 via INTRAVENOUS

## 2016-07-18 ENCOUNTER — Ambulatory Visit (HOSPITAL_COMMUNITY): Payer: Medicare Other | Admitting: Hematology & Oncology

## 2016-07-25 ENCOUNTER — Encounter (HOSPITAL_COMMUNITY): Payer: Medicare Other

## 2016-07-25 ENCOUNTER — Encounter (HOSPITAL_COMMUNITY): Payer: Medicare Other | Attending: Oncology | Admitting: Oncology

## 2016-07-25 ENCOUNTER — Encounter (HOSPITAL_COMMUNITY): Payer: Self-pay | Admitting: Oncology

## 2016-07-25 ENCOUNTER — Telehealth (HOSPITAL_COMMUNITY): Payer: Self-pay

## 2016-07-25 ENCOUNTER — Emergency Department (HOSPITAL_COMMUNITY)
Admission: EM | Admit: 2016-07-25 | Discharge: 2016-07-25 | Discharge status: Absent without leave | Payer: Medicare Other

## 2016-07-25 VITALS — BP 96/41 | HR 56 | Temp 97.8°F | Resp 22 | Wt 201.4 lb

## 2016-07-25 DIAGNOSIS — C8330 Diffuse large B-cell lymphoma, unspecified site: Secondary | ICD-10-CM | POA: Diagnosis not present

## 2016-07-25 DIAGNOSIS — Z9889 Other specified postprocedural states: Secondary | ICD-10-CM | POA: Insufficient documentation

## 2016-07-25 DIAGNOSIS — J90 Pleural effusion, not elsewhere classified: Secondary | ICD-10-CM

## 2016-07-25 DIAGNOSIS — D649 Anemia, unspecified: Secondary | ICD-10-CM

## 2016-07-25 DIAGNOSIS — Z87891 Personal history of nicotine dependence: Secondary | ICD-10-CM | POA: Diagnosis not present

## 2016-07-25 DIAGNOSIS — Z9049 Acquired absence of other specified parts of digestive tract: Secondary | ICD-10-CM | POA: Insufficient documentation

## 2016-07-25 DIAGNOSIS — C8338 Diffuse large B-cell lymphoma, lymph nodes of multiple sites: Secondary | ICD-10-CM

## 2016-07-25 LAB — COMPREHENSIVE METABOLIC PANEL
ALBUMIN: 3.4 g/dL — AB (ref 3.5–5.0)
ALT: 8 U/L — ABNORMAL LOW (ref 17–63)
AST: 13 U/L — AB (ref 15–41)
Alkaline Phosphatase: 45 U/L (ref 38–126)
Anion gap: 5 (ref 5–15)
BUN: 18 mg/dL (ref 6–20)
CHLORIDE: 102 mmol/L (ref 101–111)
CO2: 30 mmol/L (ref 22–32)
Calcium: 8.2 mg/dL — ABNORMAL LOW (ref 8.9–10.3)
Creatinine, Ser: 1.17 mg/dL (ref 0.61–1.24)
GFR calc Af Amer: >60 mL/min (ref >60)
GFR, EST NON AFRICAN AMERICAN: 56 mL/min — AB (ref >60)
Glucose, Bld: 116 mg/dL — ABNORMAL HIGH (ref 65–99)
POTASSIUM: 3.5 mmol/L (ref 3.5–5.1)
SODIUM: 137 mmol/L (ref 135–145)
Total Bilirubin: 0.7 mg/dL (ref 0.3–1.2)
Total Protein: 6.4 g/dL — ABNORMAL LOW (ref 6.5–8.1)

## 2016-07-25 LAB — SEDIMENTATION RATE: Sed Rate: 22 mm/hr — ABNORMAL HIGH (ref 0–16)

## 2016-07-25 LAB — RETICULOCYTES
RBC.: 2.17 MIL/uL — ABNORMAL LOW (ref 4.22–5.81)
RETIC COUNT ABSOLUTE: 125.9 10*3/uL (ref 19.0–186.0)
Retic Ct Pct: 5.8 % — ABNORMAL HIGH (ref 0.4–3.1)

## 2016-07-25 LAB — CBC WITH DIFFERENTIAL/PLATELET
BASOS ABS: 0.1 10*3/uL (ref 0.0–0.1)
Basophils Relative: 1 %
EOS ABS: 0.2 10*3/uL (ref 0.0–0.7)
Eosinophils Relative: 3 %
HCT: 21.3 % — ABNORMAL LOW (ref 39.0–52.0)
Hemoglobin: 6.2 g/dL — CL (ref 13.0–17.0)
LYMPHS ABS: 0.9 10*3/uL (ref 0.7–4.0)
Lymphocytes Relative: 14 %
MCH: 28.6 pg (ref 26.0–34.0)
MCHC: 29.1 g/dL — ABNORMAL LOW (ref 30.0–36.0)
MCV: 98.2 fL (ref 78.0–100.0)
MONO ABS: 0.7 10*3/uL (ref 0.1–1.0)
Monocytes Relative: 10 %
NEUTROS PCT: 72 %
Neutro Abs: 4.7 10*3/uL (ref 1.7–7.7)
PLATELETS: 130 10*3/uL — AB (ref 150–400)
RBC: 2.17 MIL/uL — AB (ref 4.22–5.81)
RDW: 15.3 % (ref 11.5–15.5)
WBC: 6.6 10*3/uL (ref 4.0–10.5)

## 2016-07-25 LAB — FOLATE: Folate: 13.3 ng/mL (ref >5.9)

## 2016-07-25 LAB — IRON AND TIBC
Iron: 98 ug/dL (ref 45–182)
SATURATION RATIOS: 25 % (ref 17.9–39.5)
TIBC: 398 ug/dL (ref 250–450)
UIBC: 300 ug/dL

## 2016-07-25 LAB — LACTATE DEHYDROGENASE: LDH: 123 U/L (ref 98–192)

## 2016-07-25 LAB — VITAMIN B12: VITAMIN B 12: 223 pg/mL (ref 180–914)

## 2016-07-25 LAB — FERRITIN: Ferritin: 24 ng/mL (ref 24–336)

## 2016-07-25 LAB — C-REACTIVE PROTEIN: CRP: 0.6 mg/dL (ref <1.0)

## 2016-07-25 MED ORDER — OXYCODONE HCL 10 MG PO TABS: 10.0000 mg | ORAL_TABLET | Freq: Four times a day (QID) | ORAL | 0 refills | Status: DC | PRN

## 2016-07-25 NOTE — Progress Notes (Signed)
[name], MD [place] [address]  Diffuse large B-cell lymphoma of lymph nodes of multiple regions [facility]) - Plan: CBC with Differential, Comprehensive metabolic panel, Lactate dehydrogenase, Sedimentation rate, Pathologist smear review, Vitamin B12, Folate, Iron and TIBC, Ferritin, Erythropoietin, Haptoglobin, Reticulocytes, C-reactive protein, Occult blood card to lab, stool, Kappa/lambda light chains, IgG, IgA, IgM, Immunofixation electrophoresis, Protein electrophoresis, serum, Occult blood card to lab, stool, Occult blood card to lab, stool, Oxycodone HCl 10 MG TABS, US THORACENTESIS ASP PLEURAL SPACE W/IMG GUIDE, CBC with Differential, Comprehensive metabolic panel, Lactate dehydrogenase, Sedimentation rate, Pathologist smear review, Vitamin B12, Folate, Iron and TIBC, Ferritin, Erythropoietin, Haptoglobin, Reticulocytes, C-reactive protein, Kappa/lambda light chains, IgG, IgA, IgM, Immunofixation electrophoresis, Protein electrophoresis, serum, Pathologist smear review  Anemia, unspecified anemia type - Plan: CBC with Differential, Comprehensive metabolic panel, Lactate dehydrogenase, Sedimentation rate, Pathologist smear review, Vitamin B12, Folate, Iron and TIBC, Ferritin, Erythropoietin, Haptoglobin, Reticulocytes, C-reactive protein, Occult blood card to lab, stool, Kappa/lambda light chains, IgG, IgA, IgM, Immunofixation electrophoresis, Protein electrophoresis, serum, Occult blood card to lab, stool, Occult blood card to lab, stool, Oxycodone HCl 10 MG TABS, US THORACENTESIS ASP PLEURAL SPACE W/IMG GUIDE, CBC with Differential, Comprehensive metabolic panel, Lactate dehydrogenase, Sedimentation rate, Pathologist smear review, Vitamin B12, Folate, Iron and TIBC, Ferritin, Erythropoietin, Haptoglobin, Reticulocytes, C-reactive protein, Kappa/lambda light chains, IgG, IgA, IgM, Immunofixation electrophoresis, Protein electrophoresis, serum, Pathologist  smear review, Practitioner attestation of consent, Complete patient signature process for consent form, Care order/instruction, 0.9 %  sodium chloride infusion, sodium chloride flush (NS) 0.9 % injection 10 mL, heparin lock flush 100 unit/mL, heparin lock flush 100 unit/mL, sodium chloride flush (NS) 0.9 % injection 3 mL, Type and screen, Prepare RBC, Transfuse RBC, acetaminophen (TYLENOL) tablet 650 mg, diphenhydrAMINE (BENADRYL) capsule 25 mg, Practitioner attestation of consent, Complete patient signature process for consent form, Care order/instruction, 0.9 %  sodium chloride infusion, sodium chloride flush (NS) 0.9 % injection 10 mL, heparin lock flush 100 unit/mL, heparin lock flush 100 unit/mL, sodium chloride flush (NS) 0.9 % injection 3 mL, Prepare RBC, Transfuse RBC  Pleural effusion - Plan: CBC with Differential, Comprehensive metabolic panel, Lactate dehydrogenase, Sedimentation rate, Pathologist smear review, Vitamin B12, Folate, Iron and TIBC, Ferritin, Erythropoietin, Haptoglobin, Reticulocytes, C-reactive protein, Occult blood card to lab, stool, Kappa/lambda light chains, IgG, IgA, IgM, Immunofixation electrophoresis, Protein electrophoresis, serum, Occult blood card to lab, stool, Occult blood card to lab, stool, Oxycodone HCl 10 MG TABS, US THORACENTESIS ASP PLEURAL SPACE W/IMG GUIDE, CBC with Differential, Comprehensive metabolic panel, Lactate dehydrogenase, Sedimentation rate, Pathologist smear review, Vitamin B12, Folate, Iron and TIBC, Ferritin, Erythropoietin, Haptoglobin, Reticulocytes, C-reactive protein, Kappa/lambda light chains, IgG, IgA, IgM, Immunofixation electrophoresis, Protein electrophoresis, serum, Pathologist smear review  CURRENT THERAPY: Surveillance  INTERVAL HISTORY: [name] 82 y.o. 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  He is S/P 1 cycle of R-mini-CHOP but issues with tolerance was appreciated.  His treatment  was transitioned to palliative management with single-agent Rituxan.  Despite bone marrow involvement, he is not a candidate for IT therapy given high likelihood of intolerance. AND Significant anemia, in the setting of RA, requiring additional work-up today.  He denies any B symptoms.  He denies any known blood loss.  He reports continued low back pain that interferes with his QOL.  He brings up the topic of driving again and he is advised to avoid driving a motor vehicle given his  past seizures.  Review of Systems  Constitutional: Positive for malaise/fatigue. Negative for chills, fever and weight loss.  HENT: Negative.  Negative for nosebleeds.   Eyes: Negative.   Respiratory: Negative.  Negative for cough and hemoptysis.   Cardiovascular: Negative.  Negative for chest pain and palpitations.  Gastrointestinal: Negative for abdominal pain, blood in stool and melena.  Genitourinary: Negative for hematuria.  Musculoskeletal: Positive for back pain (chronic low back pain).  Skin: Negative.   Neurological: Positive for weakness.  Endo/Heme/Allergies: Does not bruise/bleed easily.  Psychiatric/Behavioral: Negative.     Past Medical History:  Diagnosis Date  . Anemia   . Aortic stenosis, moderate 05/21/2015  . Asthma   . Cellulitis of leg, left 01/03/2015  . Cervical spondylosis without myelopathy   . Cholelithiasis   . Chronic atrial fibrillation ([place])   . Chronic diastolic heart failure ([place])   . Chronic lower back pain   . Cirrhosis ([place])    Questionable, AFP normal Feb 01/2012, hx of ETOH use   . COPD (chronic obstructive pulmonary disease) (H[facility])    Uses occasional nighttime O2  . Diffuse large B cell lymphoma ([place]) 08/22/2015  . Disseminated herpes zoster 2010  . DNR (do not resuscitate) 11/01/2015  . Dysrhythmia    chronic AFib  . Essential hypertension, benign   . GERD (gastroesophageal reflux disease)   . Headache(784.0)   . History of chicken pox 1941; 2011  . Lupus  anticoagulant positive   . Mixed hyperlipidemia   . Pulmonary embolus ([place])    2003 and 2011  . Pulmonary nodules    Chest CT 09/11  . Rectal ulcer 12/01/2015  . Rheumatoid arthritis(714.0)   . Seizure disorder ([place]) 09/2015  . Stage III chronic kidney disease   . Telangiectasia 11/29/2015   ANTRUM AND JEJUNUM    Past Surgical History:  Procedure Laterality Date  . BACK SURGERY    . CATARACT EXTRACTION W/ INTRAOCULAR LENS  IMPLANT, BILATERAL    . CHOLECYSTECTOMY  05/23/2012   Procedure: LAPAROSCOPIC CHOLECYSTECTOMY WITH INTRAOPERATIVE CHOLANGIOGRAM;  Surgeon: Stark Klein, MD;  Location: [facility];  Service: General;  Laterality: N/A;  . COLONOSCOPY  01/24/2013   STM:HDQQIWL polyps/colonic diverticulosis  . COLONOSCOPY N/A 12/01/2015   Procedure: COLONOSCOPY;  Surgeon: Danie Binder, MD;  [facility];  Service: Endoscopy;  Laterality: N/A;  . ESOPHAGOGASTRODUODENOSCOPY  02/02/12   NLG:XQJJHERD size hiatal hernia; otherwise normal exam  . ESOPHAGOGASTRODUODENOSCOPY N/A 11/29/2015   EYC:XKGYJEHU diverticulosis/small internal hemorrhoids/left colon is redundant  . LYMPH NODE BIOPSY    . LYMPH NODE BIOPSY Right 09/11/2015   Procedure: RIGHT INGUINAL LYMPH NODE BIOPSY;  Surgeon: Aviva Signs, MD;  Location: [facility];  Service: General;  Laterality: Right;  . MYRINGOTOMY     "3 times; both ears"  . POLYPECTOMY  02/02/2012   RMR: Multiple colonic polyps removed, flat, tubular adenomas/ Left-sided diverticulosis  . PORTACATH PLACEMENT    . PORTACATH PLACEMENT Left 09/11/2015   Procedure: INSERTION PORT-A-CATH;  Surgeon: Aviva Signs, MD;  Location: [facility];  Service: General;  Laterality: Left;  . POSTERIOR LUMBAR VERTEBRAE EXCISION  2003; 2009; 2011    Family History  Problem Relation Age of Onset  . Diabetes Mother   . Colon cancer Neg Hx   . Liver disease Neg Hx     Social History   Social History  . Marital status: Widowed    Spouse name: N/A  . Number of children: 6  .  Years of education:  7th   Occupational History  . retired Retired  . RETIRED Retired   Social History Main Topics  . Smoking status: Former Smoker    Packs/day: 1.00    Years: 57.00    Types: Cigarettes    Quit date: 01/27/1998  . Smokeless tobacco: Never Used  . Alcohol use No     Comment: "quit drinking 1976"  . Drug use: No  . Sexual activity: Not Currently   Other Topics Concern  . None   Social History Narrative  . None     PHYSICAL EXAMINATION  ECOG PERFORMANCE STATUS: 2 - Symptomatic, <50% confined to bed  Vitals:   07/25/16 1200  BP: (!) 96/41  Pulse: (!) 56  Resp: (!) 22  Temp: 97.8 F (36.6 C)    GENERAL:alert, no distress, cooperative and accompanied by daughter, in wheelchair with Terry in place for O2 delivery. SKIN: skin color, texture, turgor are normal, pale HEAD: Normocephalic EYES: normal, EOMI EARS: External ears normal OROPHARYNX:mucous membranes are moist  NECK: supple, trachea midline LYMPH:  not examined BREAST:not examined LUNGS: positive findings: rales  left lower posterior, no breath sounds in the right middle and right lower lobes. HEART: irregularly irregular ABDOMEN:abdomen soft BACK: Back symmetric, no curvature. EXTREMITIES:less then 2 second capillary refill, no joint deformities, effusion, or inflammation, no skin discoloration  NEURO: alert & oriented x 3 with fluent speech, in wheelchair   LABORATORY DATA: CBC    Component Value Date/Time   WBC 6.6 07/25/2016 1354   RBC 2.17 (L) 07/25/2016 1354   RBC 2.17 (L) 07/25/2016 1354   HGB 6.2 (LL) 07/25/2016 1354   HCT 21.3 (L) 07/25/2016 1354   HCT 34 01/11/2012 1007   PLT 130 (L) 07/25/2016 1354   MCV 98.2 07/25/2016 1354   MCV 92.0 01/11/2012 1007   MCH 28.6 07/25/2016 1354   MCHC 29.1 (L) 07/25/2016 1354   RDW 15.3 07/25/2016 1354   LYMPHSABS 0.9 07/25/2016 1354   MONOABS 0.7 07/25/2016 1354   EOSABS 0.2 07/25/2016 1354   BASOSABS 0.1 07/25/2016 1354       Chemistry      Component Value Date/Time   NA 137 07/25/2016 1354   NA 140 01/11/2012 1005   K 3.5 07/25/2016 1354   K 4.5 01/11/2012 1005   CL 102 07/25/2016 1354   CO2 30 07/25/2016 1354   BUN 18 07/25/2016 1354   BUN 12 01/11/2012 1005   CREATININE 1.17 07/25/2016 1354   CREATININE 1.17 01/11/2012 1005      Component Value Date/Time   CALCIUM 8.2 (L) 07/25/2016 1354   CALCIUM 9.2 01/11/2012 1005   ALKPHOS 45 07/25/2016 1354   AST 13 (L) 07/25/2016 1354   ALT 8 (L) 07/25/2016 1354   BILITOT 0.7 07/25/2016 1354        PENDING LABS:   RADIOGRAPHIC STUDIES:  Nm Pet Image Restag (ps) Skull Base To Thigh  Result Date: 07/15/2016 CLINICAL DATA:  Subsequent treatment strategy for diffuse large B-cell lymphoma. EXAM: NUCLEAR MEDICINE PET SKULL BASE TO THIGH TECHNIQUE: 9.4 mCi F-18 FDG was injected intravenously. Full-ring PET imaging was performed from the skull base to thigh after the radiotracer. CT data was obtained and used for attenuation correction and anatomic localization. FASTING BLOOD GLUCOSE:  Value: 98 mg/dl COMPARISON:  09/03/2015 FINDINGS: NECK No hypermetabolic lymph nodes in the neck. Previously seen small right cervical hypermetabolic lymph nodes have resolved since prior exam. CHEST Previously seen hypermetabolic lymph nodes throughout the mediastinum, bilateral hilar, right  supraclavicular, and left axillary regions have resolved since previous study. No persistent hypermetabolic lymphadenopathy identified. Although a persistent moderate size right pleural effusion is seen, there has been interval resolution of hypermetabolic activity within the pleural spaces bilaterally. Hypermetabolic pulmonary nodule in the posterior left upper lobe has resolved since previous study. 8 mm pulmonary nodule in the anterior left upper lobe on image 36/8 is stable and shows no associated metabolic activity. No new or enlarging pulmonary nodules or masses identified by CT. Increased right  middle lobe atelectasis noted. Stable pulmonary emphysema. Aortic atherosclerosis and three-vessel coronary artery calcification again demonstrated. ABDOMEN/PELVIS No abnormal hypermetabolic activity within the liver, pancreas, adrenal glands, or spleen. There has been resolution of hypermetabolic lymphadenopathy knee abdominal retroperitoneum, bilateral iliac, and inguinal regions since previous study. No persistent hypermetabolic lymph nodes in the abdomen or pelvis. Hypermetabolic activity in the gastric antrum is again noted, with SUV max of 5.9 compared to 6.4 previously. Hepatic cirrhosis and several tiny hepatic cysts remain stable. Previous cholecystectomy again noted. Bilateral renal cysts remain stable. Aortic atherosclerosis noted. SKELETON No focal hypermetabolic activity to suggest skeletal metastasis. IMPRESSION: Complete interval metabolic response to therapy. No metabolically active lymphoma identified. Persistent moderate right pleural effusion, right lung atelectasis, and emphysema. Aortic atherosclerosis and three-vessel coronary artery calcification again demonstrated. Electronically Signed   By: Earle Gell M.D.   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  He is S/P 1 cycle of R-mini-CHOP but issues with tolerance was appreciated.  His treatment was transitioned to palliative management with single-agent Rituxan.  Despite bone marrow involvement, he is not a candidate for IT therapy given high likelihood of intolerance.  Oncology history is updated.  PET scan on 07/15/2016 is reviewed in detail and patient (and daughter) are provided a copy of the report.  I personally reviewed and went over laboratory results with the patient.  The results are noted within this dictation.  He is in a complete metabolic remission according to PET imaging.  8 mm pulmonary nodule  persists.  He is symptomatic from his right pleural effusion standpoint.  He wishes to be evaluated for a thoracentesis.  Order is placed for thoracentesis.  He will need to hold his Xarelto the day prior and restart the day following procedure.  "I need a back brace."  He persists with chronic low back pain.  I will write an Rx for a compression back brace.  I personally reviewed and went over laboratory results with the patient.  The results are noted within this dictation.  His labs recently has demonstrated a significant anemia that requires further work-up.  Unfortunately, the patient primary care provider noted a significant anemia and failed to act on the lab value in an appropriate fashion, instead deferring to [place] with poor communication.  Peripheral work-up will be performed today: CBC diff, CMET, LDH, ESR, CRP, pathology smear review, anemia panel, retic count, haptoglobin, EPO level, stool cards x 3.  He wants to discuss treatment options regarding his RA and this can be discussed in the future following recent work-up for significant anemia.  ADDENDUM: After the patient's office visit, HGB is reported to be 6.2 g/dL.  As a result, we will get him set-up for a 2 unit PRBC transfusion tomorrow, followed by another 1 unit on Wednesday.   ORDERS PLACED FOR THIS ENCOUNTER: Orders Placed This Encounter  Procedures  . US THORACENTESIS ASP PLEURAL SPACE W/IMG GUIDE  . CBC with Differential  . Comprehensive metabolic panel  . Lactate dehydrogenase  . Sedimentation rate  . Pathologist smear review  . Vitamin B12  . Folate  . Iron and TIBC  . Ferritin  . Erythropoietin  . Haptoglobin  . Reticulocytes  . C-reactive protein  . Occult blood card to lab, stool  . Kappa/lambda light chains  . IgG, IgA, IgM  . Immunofixation electrophoresis  . Protein electrophoresis, serum  . Occult blood card to lab, stool  . Occult blood card to lab, stool  . Pathologist smear review  .  Practitioner attestation of consent  . Complete patient signature process for consent form  . Care order/instruction  . Practitioner attestation of consent  . Complete patient signature process for consent form  . Care order/instruction  . Type and screen    MEDICATIONS PRESCRIBED THIS ENCOUNTER: Meds ordered this encounter  Medications  . Oxycodone HCl 10 MG TABS    Sig: Take 1 tablet (10 mg total) by mouth every 6 (six) hours as needed.    Dispense:  120 tablet    Refill:  0    Order Specific Question:   Supervising Provider    Answer:   Patrici Ranks [id number]    THERAPY PLAN:  We will work-up acute onset of significant anemia.  All questions were answered. The patient knows to call the clinic with any problems, questions or concerns. We can certainly see the patient much sooner if necessary.  Patient and plan discussed with Dr. Ancil Linsey and she is in agreement with the aforementioned.   This note is electronically signed by: Doy Mince 07/25/2016 6:41 PM

## 2016-07-25 NOTE — Telephone Encounter (Signed)
See telephone note.

## 2016-07-25 NOTE — Patient Instructions (Signed)
[place] at [facility] Discharge Instructions  RECOMMENDATIONS MADE BY THE CONSULTANT AND ANY TEST RESULTS WILL BE SENT TO YOUR REFERRING PHYSICIAN.  You were seen by Gershon Mussel today. Labs today  Return to Center in 3 to 4 weeks for follow up Right Thorancentesis  For cytology  RX for back brace  Thank you for choosing [place] at [facility] to provide your oncology and hematology care.  To afford each patient quality time with our provider, please arrive at least 15 minutes before your scheduled appointment time.   Beginning January 23rd 2017 lab work for the [facility] will be done in the  Main lab at [facility] on 1st floor. If you have a lab appointment with the [facility] please come in thru the  Main Entrance and check in at the main information desk  You need to re-schedule your appointment should you arrive 10 or more minutes late.  We strive to give you quality time with our providers, and arriving late affects you and other patients whose appointments are after yours.  Also, if you no show three or more times for appointments you may be dismissed from the clinic at the providers discretion.     Again, thank you for choosing [facility].  Our hope is that these requests will decrease the amount of time that you wait before being seen by our physicians.       _____________________________________________________________  Should you have questions after your visit to [facility], please contact our office at [phone] between the hours of 8:30 a.m. and 4:30 p.m.  Voicemails left after 4:30 p.m. will not be returned until the following business day.  For prescription refill requests, have your pharmacy contact our office.         Resources For Cancer Patients and their Caregivers ? American Cancer Society: Can assist with transportation, wigs, general needs, runs Look Good Feel Better.         5593507646 ? Cancer Care: Provides financial assistance, online support groups, medication/co-pay assistance.  1-800-813-HOPE (218)346-0078) ? [facility] Assists [place] cancer patients and their families through emotional , educational and financial support.  4433631521 ? [place] DSS Where to apply for food stamps, Medicaid and utility assistance. 310-375-9884 ? [facility]: Transportation to medical appointments. 7735002056 ? Social Security Administration: May apply for disability if have a Stage IV cancer. 5816952594 262 624 5188 ? [employer], Disability and Transit Services: Assists with nutrition, care and transit needs. [place] Support Programs: @10RELATIVEDAYS @ > Cancer Support Group  2nd Tuesday of the month 1pm-2pm, Journey Room  > Creative Journey  3rd Tuesday of the month 1130am-1pm, Journey Room  > Look Good Feel Better  1st Wednesday of the month 10am-12 noon, Journey Room (Call American Cancer Society to register 8701370015)  Stool for Occult Blood Test WHY AM I HAVING THIS TEST? Stool for occult blood, or fecal occult blood test (FOBT), is a test that is used to screen for gastrointestinal (GI) bleeding, which may be an indicator of colon cancer. This test can also detect small amounts of blood in your stool (feces) from other causes, such as ulcers or hemorrhoids. This test is usually done as part of an annual routine examination after age 28. WHAT KIND OF SAMPLE IS TAKEN? A sample of your stool is required for this test. Your health care provider may collect the sample with a  swab of the rectum. Or, you may be instructed to collect the sample in a container at home. If you are instructed to collect the sample, your health care provider will provide you with the instructions and the supplies that you will need to do that. WILL I NEED TO COLLECT SAMPLES AT HOME?  A stool sample may need to be collected  at home. When collecting a sample at home, make sure that you:  Use the sterile containers and other supplies that were given to you from the lab.  Do not mix urine, toilet paper, or water with your sample.  Label all slides and containers with your name and the date when you collected the sample. Your health care provider or lab staff will give you one or more test "cards." You will collect a separate sample from three different stools, usually on different days that follow each other. Follow these steps for each sample: 1. Collect a stool sample into a clean container. 2. With an applicator stick, apply a thin smear of stool sample onto each filter paper square or window that is on the card. 3. Allow the filter paper to dry. After it is dry, the sample will be stable. Usually, you will return all of the samples to your health care provider or lab at the same time.  HOW DO I PREPARE FOR THE TEST?  Do not eat any red meat within three days before testing.  Follow your health care provider's instructions about eating and drinking prior to the test. Your health care provider may instruct you to avoid other foods or substances.  Ask your health care provider about taking or not taking your medicines prior to the test. You may be instructed to avoid certain medicines that are known to interfere with this test. HOW ARE THE TEST RESULTS REPORTED? Your test results will be reported as either positive or negative. It is your responsibility to obtain your test results. Ask the lab or department performing the test when and how you will get your results. WHAT DO THE RESULTS MEAN? A negative test result means that there is no occult blood within the stool. A negative result is normal. A positive test result may mean that there is blood in the stool. Causes of blood in the stool include:  GI tumors.  Certain GI diseases.  GI trauma or recent surgery.  Hemorrhoids. Talk with your health care  provider to discuss your results, treatment options, and if necessary, the need for more tests. Talk with your health care provider if you have any questions about your results.   This information is not intended to replace advice given to you by your health care provider. Make sure you discuss any questions you have with your health care provider.   Document Released: 01/06/2005 Document Revised: 01/02/2015 Document Reviewed: 05/09/2014 Elsevier Interactive Patient Education [employer].

## 2016-07-25 NOTE — Assessment & Plan Note (Signed)
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  He is S/P 1 cycle of R-mini-CHOP but issues with tolerance was appreciated.  His treatment was transitioned to palliative management with single-agent Rituxan.  Despite bone marrow involvement, he is not a candidate for IT therapy given high likelihood of intolerance.  Oncology history is updated.  PET scan on 07/15/2016 is reviewed in detail and patient (and daughter) are provided a copy of the report.  I personally reviewed and went over laboratory results with the patient.  The results are noted within this dictation.  He is in a complete metabolic remission according to PET imaging.  8 mm pulmonary nodule persists.  He is symptomatic from his right pleural effusion standpoint.  He wishes to be evaluated for a thoracentesis.  Order is placed for thoracentesis.  He will need to hold his Xarelto the day prior and restart the day following procedure.  "I need a back brace."  He persists with chronic low back pain.  I will write an Rx for a compression back brace.  I personally reviewed and went over laboratory results with the patient.  The results are noted within this dictation.  His labs recently has demonstrated a significant anemia that requires further work-up.  Unfortunately, the patient primary care provider noted a significant anemia and failed to act on the lab value in an appropriate fashion, instead deferring to [place] with poor communication.  Peripheral work-up will be performed today: CBC diff, CMET, LDH, ESR, CRP, pathology smear review, anemia panel, retic count, haptoglobin, EPO level, stool cards x 3.  He wants to discuss treatment options regarding his RA and this can be discussed in the future following recent work-up for significant anemia.  ADDENDUM: After the patient's office visit, HGB is reported to be 6.2 g/dL.  As a result, we will get him set-up for a 2 unit PRBC transfusion tomorrow, followed by another 1 unit on  Wednesday.

## 2016-07-25 NOTE — Progress Notes (Signed)
CRITICAL VALUE ALERT Critical value received:  HGB 6.2 Date of notification:  07/25/2016 Time of notification: 1440 Critical value read back:  Yes.   Nurse who received alert:  C.Page RN MD notified (1st page):  Kirby Crigler PA-C

## 2016-07-26 ENCOUNTER — Other Ambulatory Visit: Payer: Self-pay | Admitting: Cardiology

## 2016-07-26 ENCOUNTER — Encounter (HOSPITAL_COMMUNITY): Payer: Self-pay

## 2016-07-26 ENCOUNTER — Encounter (HOSPITAL_COMMUNITY): Payer: Medicare Other | Attending: Hematology & Oncology

## 2016-07-26 ENCOUNTER — Encounter (HOSPITAL_COMMUNITY): Payer: Medicare Other

## 2016-07-26 DIAGNOSIS — D649 Anemia, unspecified: Secondary | ICD-10-CM | POA: Diagnosis not present

## 2016-07-26 DIAGNOSIS — E538 Deficiency of other specified B group vitamins: Secondary | ICD-10-CM | POA: Diagnosis present

## 2016-07-26 DIAGNOSIS — J90 Pleural effusion, not elsewhere classified: Secondary | ICD-10-CM

## 2016-07-26 DIAGNOSIS — C8338 Diffuse large B-cell lymphoma, lymph nodes of multiple sites: Secondary | ICD-10-CM

## 2016-07-26 LAB — IGG, IGA, IGM
IGG (IMMUNOGLOBIN G), SERUM: 1073 mg/dL (ref 700–1600)
IGM, SERUM: 44 mg/dL (ref 15–143)
IgA: 376 mg/dL (ref 61–437)

## 2016-07-26 LAB — IMMUNOFIXATION ELECTROPHORESIS
IGA: 366 mg/dL (ref 61–437)
IGM, SERUM: 44 mg/dL (ref 15–143)
IgG (Immunoglobin G), Serum: 1096 mg/dL (ref 700–1600)
TOTAL PROTEIN ELP: 6.2 g/dL (ref 6.0–8.5)

## 2016-07-26 LAB — PROTEIN ELECTROPHORESIS, SERUM
A/G Ratio: 1.1 (ref 0.7–1.7)
ALPHA-2-GLOBULIN: 0.6 g/dL (ref 0.4–1.0)
Albumin ELP: 3.2 g/dL (ref 2.9–4.4)
Alpha-1-Globulin: 0.3 g/dL (ref 0.0–0.4)
BETA GLOBULIN: 1.1 g/dL (ref 0.7–1.3)
Gamma Globulin: 1.1 g/dL (ref 0.4–1.8)
Globulin, Total: 3 g/dL (ref 2.2–3.9)
Total Protein ELP: 6.2 g/dL (ref 6.0–8.5)

## 2016-07-26 LAB — PREPARE RBC (CROSSMATCH)

## 2016-07-26 LAB — ERYTHROPOIETIN: ERYTHROPOIETIN: 136.8 m[IU]/mL — AB (ref 2.6–18.5)

## 2016-07-26 LAB — KAPPA/LAMBDA LIGHT CHAINS
KAPPA, LAMDA LIGHT CHAIN RATIO: 1.59 (ref 0.26–1.65)
Kappa free light chain: 74.4 mg/L — ABNORMAL HIGH (ref 3.3–19.4)
LAMDA FREE LIGHT CHAINS: 46.8 mg/L — AB (ref 5.7–26.3)

## 2016-07-26 LAB — HAPTOGLOBIN: HAPTOGLOBIN: 89 mg/dL (ref 34–200)

## 2016-07-26 LAB — PATHOLOGIST SMEAR REVIEW

## 2016-07-26 MED ORDER — ACETAMINOPHEN 325 MG PO TABS
650.0000 mg | ORAL_TABLET | Freq: Once | ORAL | Status: AC
  Administered 2016-07-26: 650 mg via ORAL

## 2016-07-26 MED ORDER — SODIUM CHLORIDE 0.9% FLUSH: 10.0000 mL | INTRAVENOUS | Status: DC | PRN

## 2016-07-26 MED ORDER — DIPHENHYDRAMINE HCL 25 MG PO CAPS
ORAL_CAPSULE | ORAL | Status: AC
  Filled 2016-07-26: qty 1

## 2016-07-26 MED ORDER — HEPARIN SOD (PORK) LOCK FLUSH 100 UNIT/ML IV SOLN
500.0000 [IU] | Freq: Every day | INTRAVENOUS | Status: AC | PRN
  Administered 2016-07-26: 500 [IU]

## 2016-07-26 MED ORDER — SODIUM CHLORIDE 0.9 % IV SOLN
250.0000 mL | Freq: Once | INTRAVENOUS | Status: AC
  Administered 2016-07-26: 250 mL via INTRAVENOUS

## 2016-07-26 MED ORDER — ACETAMINOPHEN 325 MG PO TABS
ORAL_TABLET | ORAL | Status: AC
  Filled 2016-07-26: qty 2

## 2016-07-26 MED ORDER — DIPHENHYDRAMINE HCL 25 MG PO TABS
25.0000 mg | ORAL_TABLET | Freq: Once | ORAL | Status: AC
  Administered 2016-07-26: 25 mg via ORAL

## 2016-07-26 NOTE — Progress Notes (Signed)
Tolerated transfusion w/o adverse reaction.  A&Ox4, in no distress.  VSS. Discharged via wheelchair.

## 2016-07-26 NOTE — Patient Instructions (Signed)
[place] at [facility] Discharge Instructions  RECOMMENDATIONS MADE BY THE CONSULTANT AND ANY TEST RESULTS WILL BE SENT TO YOUR REFERRING PHYSICIAN.  Today you received 2 units of blood. Return tomorrow as scheduled to receive 1 unit of blood.   Thank you for choosing [place] at [facility] to provide your oncology and hematology care.  To afford each patient quality time with our provider, please arrive at least 15 minutes before your scheduled appointment time.   Beginning January 23rd 2017 lab work for the [facility] will be done in the  Main lab at [facility] on 1st floor. If you have a lab appointment with the [facility] please come in thru the  Main Entrance and check in at the main information desk  You need to re-schedule your appointment should you arrive 10 or more minutes late.  We strive to give you quality time with our providers, and arriving late affects you and other patients whose appointments are after yours.  Also, if you no show three or more times for appointments you may be dismissed from the clinic at the providers discretion.     Again, thank you for choosing [facility].  Our hope is that these requests will decrease the amount of time that you wait before being seen by our physicians.       _____________________________________________________________  Should you have questions after your visit to [facility], please contact our office at [phone] between the hours of 8:30 a.m. and 4:30 p.m.  Voicemails left after 4:30 p.m. will not be returned until the following business day.  For prescription refill requests, have your pharmacy contact our office.         Resources For Cancer Patients and their Caregivers ? American Cancer Society: Can assist with transportation, wigs, general needs, runs Look Good Feel Better.        (515)258-2883 ? Cancer Care: Provides financial  assistance, online support groups, medication/co-pay assistance.  1-800-813-HOPE (952) 610-2783) ? [place] Assists [place] cancer patients and their families through emotional , educational and financial support.  7740121967 ? [place] DSS Where to apply for food stamps, Medicaid and utility assistance. 253-124-9174 ? [facility]: Transportation to medical appointments. 586-607-0291 ? Social Security Administration: May apply for disability if have a Stage IV cancer. 936-581-2804 208 700 0415 ? [employer], Disability and Transit Services: Assists with nutrition, care and transit needs. [place] Support Programs: @10RELATIVEDAYS @ > Cancer Support Group  2nd Tuesday of the month 1pm-2pm, Journey Room  > Creative Journey  3rd Tuesday of the month 1130am-1pm, Journey Room  > Look Good Feel Better  1st Wednesday of the month 10am-12 noon, Journey Room (Call [place] to register [phone])

## 2016-07-27 ENCOUNTER — Encounter (HOSPITAL_COMMUNITY): Payer: Medicare Other

## 2016-07-27 LAB — TYPE AND SCREEN
ABO/RH(D): O NEG
ANTIBODY SCREEN: NEGATIVE
UNIT DIVISION: 0
Unit division: 0
Unit division: 0

## 2016-07-28 ENCOUNTER — Other Ambulatory Visit (HOSPITAL_COMMUNITY): Payer: Self-pay | Admitting: Oncology

## 2016-07-28 ENCOUNTER — Ambulatory Visit (HOSPITAL_COMMUNITY)
Admission: RE | Admit: 2016-07-28 | Discharge: 2016-07-28 | Disposition: A | Discharge status: Home / Self Care | Payer: Medicare Other | Source: Ambulatory Visit | Attending: Diagnostic Radiology | Admitting: Diagnostic Radiology

## 2016-07-28 ENCOUNTER — Ambulatory Visit (HOSPITAL_COMMUNITY)
Admission: RE | Admit: 2016-07-28 | Discharge: 2016-07-28 | Disposition: A | Discharge status: Home / Self Care | Payer: Medicare Other | Source: Ambulatory Visit | Attending: Oncology | Admitting: Oncology

## 2016-07-28 ENCOUNTER — Encounter (HOSPITAL_COMMUNITY): Payer: Self-pay

## 2016-07-28 ENCOUNTER — Encounter (HOSPITAL_COMMUNITY): Payer: Self-pay | Admitting: Oncology

## 2016-07-28 DIAGNOSIS — D509 Iron deficiency anemia, unspecified: Secondary | ICD-10-CM

## 2016-07-28 DIAGNOSIS — C8338 Diffuse large B-cell lymphoma, lymph nodes of multiple sites: Secondary | ICD-10-CM | POA: Diagnosis present

## 2016-07-28 DIAGNOSIS — Z9889 Other specified postprocedural states: Secondary | ICD-10-CM

## 2016-07-28 DIAGNOSIS — D649 Anemia, unspecified: Secondary | ICD-10-CM | POA: Diagnosis present

## 2016-07-28 DIAGNOSIS — J90 Pleural effusion, not elsewhere classified: Secondary | ICD-10-CM | POA: Diagnosis present

## 2016-07-28 DIAGNOSIS — E538 Deficiency of other specified B group vitamins: Secondary | ICD-10-CM

## 2016-07-28 HISTORY — DX: Deficiency of other specified B group vitamins: E53.8

## 2016-07-28 NOTE — Procedures (Signed)
PreOperative Dx: RT pleural effusion, DLBCL Postoperative Dx: RT pleural effusion, DLBCL Procedure:   US guided RT thoracentesis Radiologist:  Thornton Papas Anesthesia:  10 ml of 1% lidocaine Specimen:  1.3 L of yellow colored fluid EBL:   < 1 ml Complications: None

## 2016-07-28 NOTE — Progress Notes (Signed)
Thoracentesis complete no signs of distress, 1300 ml yellow colored pleural fluid removed.

## 2016-07-29 ENCOUNTER — Other Ambulatory Visit (HOSPITAL_COMMUNITY): Payer: Self-pay | Admitting: Emergency Medicine

## 2016-07-31 ENCOUNTER — Encounter (HOSPITAL_COMMUNITY): Payer: Self-pay | Admitting: Hematology & Oncology

## 2016-07-31 NOTE — Progress Notes (Signed)
Tony Mustache, MD [place] [address]  DLBCL, Stage IV LDH 242 (3-23 U/L) IPI of 3 High -intermediate Risk with 5 years OS 43%, CR of 55% Rheumatoid arthritis, Enbrel and MTX for "many years" BMBX with suggestion of involvement with DLBCL    Diffuse large B cell lymphoma ([place])   08/22/2015 - 08/25/2015 Hospital Admission    Acute respiratory distress     08/22/2015 Imaging    CTA chest- Right infrahilar lymph node versus central lung mass measuring 2.8 x 4.1 cm. Worsening mediastinal adenopathy and bilateral hilar adenopathy as described with the largest node over the subcarinal region measuring 2.7 cm by short axis...     08/24/2015 Pathology Results    Diagnosis PLEURAL FLUID, RIGHT(SPECIMEN 1 OF 1 COLLECTED 08/24/15): ATYPICAL LYMPHOCYTES SUSPICIOUS FOR A LYMPHOPROLIFERATIVE PROCESS, SEE COMMENT. [name]     08/25/2015 Imaging    CT abd/pelvis- Mild upper retroperitoneal (gastrohepatic ligament and right retrocrural) lymphadenopathy. Cirrhosis.     09/03/2015 PET scan    Widespread metastatic adenopathy including the RIGHT cervical lymph nodes, LEFT axillary lymph nodes, mediastinal lymph, hilar lymph node, retroperitoneal lymph nodes and iliac lymph nodes and inguinal lymph nodes. Inguinal lymph nodes may be most....     09/11/2015 Pathology Results    Diagnosis Lymph node, biopsy, right inguinal - DIFFUSE LARGE B CELL LYMPHOMA.     09/11/2015 Pathology Results    Interpretation Tissue-Flow Cytometry - MONOCLONAL B-CELL POPULATION IDENTIFIED. Diagnosis Comment: The findings are consistent with non-Hodgkin B-cell lymphoma. (BNS:ecj 09/16/2015)     09/13/2015 - 09/18/2015 Hospital Admission    COPD exacerbation     09/18/2015 Bone Marrow Biopsy    Bone Marrow, Aspirate,Biopsy, and Clot, left iliac crest: Despite limited findings, the features are worrisome for minimal involvement by B cell lymphoproliferative process, particularly given the  previous history of large B cell lymphoma.      09/21/2015 - 10/11/2015 Chemotherapy    Mini-R-CHOP     09/29/2015 - 10/02/2015 Hospital Admission    SOB, anemia, dehydration, recurrent pleural effusion (right) Direct admit from [facility].  S/p 5 units of PRBCs, therapeutic thoracentesis on right relieveing 1.5 L of fluid, adjustment of pain medication.     10/12/2015 Treatment Plan Change    Patient intolerant to R-mini-CHOP.  Trantition to palliative treatment with single-agent Rituxan.     10/12/2015 - 11/02/2015 Chemotherapy    Rituxan single-agent     10/18/2015 - 10/21/2015 Hospital Admission    1.   Acute encephalopathy 2.   New onset seizure disorder     10/19/2015 Imaging    EEG- This recording is abnormal for the following reasons: Single epileptiform discharge involving the right anterior temporal area. This can correlates clinically with focal seizures.     11/28/2015 - 12/03/2015 Hospital Admission    Hospital Admission for BRBPR                 CURRENT THERAPY: Observation, last treatment with single agent rituxan given on 11/02/2015  INTERVAL HISTORY:  [name] 82 y.o. male returns today for followup of stage IV DLBCL.  He received only one treatment of R mini CHOP.  He then received two additional cycles of single agent Rituxan with the last being given on 11/02/2015.   He lives with his daughter. They both note that his appetite is excellent. Weight is up to 200 pounds. He looks quite well overall. Still wearing O2. Appears chronically SOB.  Major complaint is back and joint pain. He wishes to be back on enbrel for his RA.   His daughter notes that some days he can be quite confused. He denies this. He wants to drive and wants his license back. This is a point of contention at home.   He denies night sweats. Denies problems with sleeping.    Past Medical History:  Diagnosis Date  . Anemia   . Aortic stenosis, moderate 05/21/2015  . Asthma   . B12 deficiency  07/28/2016  . Cellulitis of leg, left 01/03/2015  . Cervical spondylosis without myelopathy   . Cholelithiasis   . Chronic atrial fibrillation ([place])   . Chronic diastolic heart failure ([place])   . Chronic lower back pain   . Cirrhosis ([place])    Questionable, AFP normal Feb 01/2012, hx of ETOH use   . COPD (chronic obstructive pulmonary disease) (HCC)    Uses occasional nighttime O2  . Diffuse large B cell lymphoma ([place]) 08/22/2015  . Disseminated herpes zoster 2010  . DNR (do not resuscitate) 11/01/2015  . Dysrhythmia    chronic AFib  . Essential hypertension, benign   . GERD (gastroesophageal reflux disease)   . Headache(784.0)   . History of chicken pox 1941; 2011  . Iron deficiency anemia 01/18/2012  . Lupus anticoagulant positive   . Mixed hyperlipidemia   . Pulmonary embolus ([place])    2003 and 2011  . Pulmonary nodules    Chest CT 09/11  . Rectal ulcer 12/01/2015  . Rheumatoid arthritis(714.0)   . Seizure disorder ([place]) 09/2015  . Stage III chronic kidney disease   . Telangiectasia 11/29/2015   ANTRUM AND JEJUNUM    has Essential hypertension, benign; PULMONARY EMBOLISM; Chronic atrial fibrillation ([place]); Rheumatoid arthritis ([place]); Iron deficiency anemia; Lupus anticoagulant positive; GERD (gastroesophageal reflux disease); Cirrhosis ([place]); COPD (chronic obstructive pulmonary disease) ([place]); Long term current use of anticoagulant; Arthritis; Anemia; Adenomatous polyps; Chronic diastolic heart failure ([place]); Headache(784.0); Cervical spondylosis without myelopathy; Stiffness of joints, not elsewhere classified, multiple sites; Posture imbalance; Cellulitis of leg, left; Acute kidney injury ([place]); Thrombocytopenia ([place]); Stage III chronic kidney disease; Acute on chronic diastolic CHF (congestive heart failure) ([place]); COPD exacerbation ([place]); Aortic stenosis, moderate; Acute diastolic CHF (congestive heart failure) ([place]); Acute respiratory distress ([place]); Diffuse large B cell lymphoma ([place]);  Chronic a-fib (HCC); BPH (benign prostatic hyperplasia); Atrial fibrillation with RVR ([place]); HCAP (healthcare-associated pneumonia); COPD with acute exacerbation ([place]); Recurrent right pleural effusion; Chronic diastolic CHF (congestive heart failure) ([place]); Chronic respiratory failure ([place]); Malignant pleural effusion; Altered mental state; Acute encephalopathy; Chronic respiratory failure with hypoxia ([place]); Leukocytosis; Seroma; Cellulitis of groin; Altered mental status; Back pain; Status post thoracentesis; DNR (do not resuscitate); Syncope and collapse; AKI (acute kidney injury) ([place]); Dehydration; History of pulmonary embolism; Elevated troponin I level; Normocytic anemia; GI bleed; Epigastric abdominal pain; Rectal ulcer; Telangiectasia; and B12 deficiency on his problem list.     is allergic to cymbalta [duloxetine hcl]; procaine hcl; and bystolic [nebivolol hcl].  Current Outpatient Prescriptions on File Prior to Visit  Medication Sig Dispense Refill  . albuterol (PROAIR HFA) 108 (90 BASE) MCG/ACT inhaler Inhale 2 puffs into the lungs every 6 (six) hours as needed. Shortness of Breath 1 Inhaler 3  . cetirizine (ZYRTEC) 10 MG tablet Take 10 mg by mouth daily.      Marland Kitchen diltiazem (CARDIZEM CD) 120 MG 24 hr capsule Take 1 capsule (120 mg total) by mouth daily. 30 capsule 3  . diphenhydrAMINE (BENADRYL)  25 MG tablet Take 25 mg by mouth every 6 (six) hours as needed for itching or allergies.    . ferrous sulfate 325 (65 FE) MG tablet Take 325 mg by mouth daily.    . fluticasone (FLONASE) 50 MCG/ACT nasal spray Place 2 sprays into both nostrils daily.   0  . folic acid (FOLVITE) 1 MG tablet Take 1 mg by mouth daily.     . furosemide (LASIX) 40 MG tablet Take 1 tablet (40 mg total) by mouth daily as needed for fluid (For fluid buildup in your lungs and legs.). Take daily for prevention of fluid buildup.    . gabapentin (NEURONTIN) 300 MG capsule Take 1 capsule (300 mg total) by mouth 2 (two) times  daily.    Marland Kitchen ipratropium-albuterol (DUONEB) 0.5-2.5 (3) MG/3ML SOLN Take 3 mLs by nebulization every 6 (six) hours as needed. (Patient taking differently: Take 3 mLs by nebulization every 6 (six) hours as needed (shortness of breath). ) 360 mL 1  . ketoconazole (NIZORAL) 2 % shampoo Apply 1 application topically 3 (three) times a week.     . levETIRAcetam (KEPPRA) 500 MG tablet Take 1 tablet (500 mg total) by mouth 2 (two) times daily. 60 tablet 0  . methocarbamol (ROBAXIN) 500 MG tablet Take 1 tablet (500 mg total) by mouth every 8 (eight) hours as needed for muscle spasms. 30 tablet 0  . Omega-3 Fatty Acids (FISH OIL) 1000 MG CAPS Take 2 capsules by mouth 2 (two) times daily.     Marland Kitchen omeprazole (PRILOSEC) 40 MG capsule Take 1 capsule (40 mg total) by mouth 2 (two) times daily. 60 capsule 3  . potassium chloride SA (K-DUR,KLOR-CON) 20 MEQ tablet Take 0.5 tablets (10 mEq total) by mouth daily as needed (Take as needed when you take Lasix (furosemide).). Starting 08/27/15.    . rivaroxaban (XARELTO) 20 MG TABS tablet Take 20 mg by mouth daily with supper.    Orlie Dakin Sodium (SENOKOT S PO) Take 1 tablet by mouth daily.     . tadalafil (CIALIS) 5 MG tablet Take 5 mg by mouth daily as needed for erectile dysfunction.     . tamsulosin (FLOMAX) 0.4 MG CAPS capsule Take 1 capsule (0.4 mg total) by mouth at bedtime. For prostate treatment. 30 capsule 3  . [DISCONTINUED] allopurinol (ZYLOPRIM) 300 MG tablet Take 1 tablet (300 mg total) by mouth daily. 30 tablet 3  . lidocaine-prilocaine (EMLA) cream Apply a quarter size amount to port site 1 hour prior to chemo. Do not rub in. Cover with plastic wrap. 30 g 3  . Vitamin D, Ergocalciferol, (DRISDOL) 50000 UNITS CAPS capsule Take 1 capsule by mouth once a week. Reported on 07/05/2016  4   No current facility-administered medications on file prior to visit.     Past Surgical History:  Procedure Laterality Date  . BACK SURGERY    . CATARACT EXTRACTION  W/ INTRAOCULAR LENS  IMPLANT, BILATERAL    . CHOLECYSTECTOMY  05/23/2012   Procedure: LAPAROSCOPIC CHOLECYSTECTOMY WITH INTRAOPERATIVE CHOLANGIOGRAM;  Surgeon: Stark Klein, MD;  Location: [facility];  Service: General;  Laterality: N/A;  . COLONOSCOPY  01/24/2013   PPI:RJJOACZ polyps/colonic diverticulosis  . COLONOSCOPY N/A 12/01/2015   Procedure: COLONOSCOPY;  Surgeon: Danie Binder, MD;  [facility];  Service: Endoscopy;  Laterality: N/A;  . ESOPHAGOGASTRODUODENOSCOPY  02/02/12   YSA:YTKZSWFU size hiatal hernia; otherwise normal exam  . ESOPHAGOGASTRODUODENOSCOPY N/A 11/29/2015   XNA:TFTDDUKG diverticulosis/small internal hemorrhoids/left colon is redundant  .  LYMPH NODE BIOPSY    . LYMPH NODE BIOPSY Right 09/11/2015   Procedure: RIGHT INGUINAL LYMPH NODE BIOPSY;  Surgeon: Aviva Signs, MD;  Location: [facility];  Service: General;  Laterality: Right;  . MYRINGOTOMY     "3 times; both ears"  . POLYPECTOMY  02/02/2012   RMR: Multiple colonic polyps removed, flat, tubular adenomas/ Left-sided diverticulosis  . PORTACATH PLACEMENT    . PORTACATH PLACEMENT Left 09/11/2015   Procedure: INSERTION PORT-A-CATH;  Surgeon: Aviva Signs, MD;  Location: [facility];  Service: General;  Laterality: Left;  . POSTERIOR LUMBAR VERTEBRAE EXCISION  2003; 2009; 2011    Denies any headaches, dizziness, double vision, fevers, chills, night sweats, nausea, vomiting, diarrhea, constipation, chest pain, heart palpitations, shortness of breath, blood in stool, black tarry stool, urinary pain, urinary burning, urinary frequency, hematuria. 14 point review of systems was performed and is negative except as detailed under history of present illness and above    PHYSICAL EXAMINATION  ECOG PERFORMANCE STATUS: 2 - Symptomatic, <50% confined to bed  Vitals:   07/05/16 1054  BP: (!) 96/44  Pulse: 69  Resp: 16  Temp: 97.6 F (36.4 C)    GENERAL:alert, no distress, well nourished, well developed, comfortable,  cooperative, and accompanied by daughter. Wearing O2. In wheelchair SKIN: skin color, texture, turgor are normal, no rashes or significant lesions HEAD: Normocephalic, No masses, lesions, tenderness or abnormalities EYES: normal, PERRLA, EOMI, Conjunctiva are pink and non-injected EARS: External ears normal OROPHARYNX:lips, buccal mucosa, and tongue normal and mucous membranes are moist  NECK: supple, thyroid normal size, non-tender, without nodularity, no stridor, non-tender, trachea midline LYMPH: No palpable adenopathy BREAST:not examined LUNGS: Decreased throughout HEART: irregularly irregular rate & rhythm, no murmurs and no gallops ABDOMEN:abdomen soft and normal bowel sounds BACK: Back symmetric, no curvature. EXTREMITIES:less then 2 second capillary refill, no joint deformities, effusion, or inflammation, no skin discoloration, no cyanosis  NEURO: alert & oriented x 3 with fluent speech, cannot stand without assistance    LABORATORY DATA: I have reviewed the data as listed  Results for Tony, Mann (MRN [id number])   Ref. Range 12/01/2015 06:55  Sodium Latest Ref Range: 135 - 145 mmol/L 139  Potassium Latest Ref Range: 3.5 - 5.1 mmol/L 3.8  Chloride Latest Ref Range: 101 - 111 mmol/L 109  CO2 Latest Ref Range: 22 - 32 mmol/L 30  BUN Latest Ref Range: 6 - 20 mg/dL 10  Creatinine Latest Ref Range: 0.61 - 1.24 mg/dL 1.01  Calcium Latest Ref Range: 8.9 - 10.3 mg/dL 8.2 (L)  EGFR (Non-African Amer.) Latest Ref Range: >60 mL/min >60  EGFR (African American) Latest Ref Range: >60 mL/min >60  Glucose Latest Ref Range: 65 - 99 mg/dL 91  Alkaline Phosphatase Latest Ref Range: 38 - 126 U/L 51  Albumin Latest Ref Range: 3.5 - 5.0 g/dL 2.6 (L)  AST Latest Ref Range: 15 - 41 U/L 21  ALT Latest Ref Range: 17 - 63 U/L 11 (L)  Total Protein Latest Ref Range: 6.5 - 8.1 g/dL 5.2 (L)  Total Bilirubin Latest Ref Range: 0.3 - 1.2 mg/dL 1.1  WBC Latest Ref Range: 4.0 - 10.5 K/uL 5.3  RBC  Latest Ref Range: 4.22 - 5.81 MIL/uL 3.32 (L)  Hemoglobin Latest Ref Range: 13.0 - 17.0 g/dL 10.4 (L)  HCT Latest Ref Range: 39.0 - 52.0 % 31.5 (L)  MCV Latest Ref Range: 78.0 - 100.0 fL 94.9  MCH Latest Ref Range: 26.0 - 34.0 pg 31.3  MCHC  Latest Ref Range: 30.0 - 36.0 g/dL 33.0  RDW Latest Ref Range: 11.5 - 15.5 % 15.7 (H)  Platelets Latest Ref Range: 150 - 400 K/uL 90 (L)     RADIOGRAPHIC STUDIES:   CLINICAL DATA: Dyspnea. History of asthma  EXAM: PORTABLE CHEST 1 VIEW  COMPARISON: 10/18/2015  FINDINGS: Right pleural effusion seen previously has resolved.  There is chronic hyperinflation and interstitial coarsening consistent with COPD. There is no edema, consolidation, effusion, or pneumothorax.  Left subclavian porta catheter, tip at the SVC origin.  Stable heart size and aortic contours.  IMPRESSION: COPD without acute superimposed finding.   Electronically Signed  By: Monte Fantasia M.D.  On: 11/28/2015 14:28     PATHOLOGY:  Diagnosis PLEURAL FLUID, RIGHT(SPECIMEN 1 OF 1 COLLECTED 08/24/15): ATYPICAL LYMPHOCYTES SUSPICIOUS FOR A LYMPHOPROLIFERATIVE PROCESS, SEE COMMENT. [name] Pathologist, Electronic Signature (Case signed 08/28/2015)          ASSESSMENT AND PLAN:  DLBCL, Stage IV LDH 242 (3-23 U/L) IPI of 3 High -intermediate Risk with 5 years OS 43%, CR of 55% Rheumatoid arthritis, Enbrel and MTX for "many years" PET/CT with widespread metastatic adenopathy including the right cervical lymph nodes, left axillary lymph nodes, mediastinal lymph nodes, hilar, retroperitoneal lymph nodes and iliac lymph nodes. Inguinal lymph nodes may be most accessible for biopsy. Hypermetabolic pleural thickening in discrete nodules within the lungs consistent with metastatic disease, right infrahilar mass versus adenopathy, hypermetabolic thickening through the gastric antrum/pyloric region, moderate right effusion Macrocytosis  Lupus  Anticoagulant BMBX worrisome for minimal involvement with DLBCL Thrombocytopenia  I have not seen the patient in some time. His weight is up, daughter notes his appetite his good. He continues to struggle with his RA. He is living with his daughter.  He does not want me to obtain labs here, He had labs done earlier at Dr. Murrell Redden office yesterday.  We will get a copy of these.  He would like to know "what my cancer is doing" -- will obtain a PET/CT. He will follow-up with [place] post for additional recommendations and follow-up.  All questions were answered. The patient knows to call the clinic with any problems, questions or concerns. We can certainly see the patient much sooner if necessary.  This document serves as a record of services personally performed by Ancil Linsey, MD. It was created on her behalf by Toni Amend, a trained medical scribe. The creation of this record is based on the scribe's personal observations and the provider's statements to them. This document has been checked and approved by the attending provider.  I have reviewed the above documentation for accuracy and completeness, and I agree with the above.  This note is electronically signed [id number] Cyril Mourning, MD  07/31/2016 6:44 PM

## 2016-08-18 ENCOUNTER — Ambulatory Visit (HOSPITAL_COMMUNITY): Payer: Medicare Other | Admitting: Hematology & Oncology

## 2016-08-25 ENCOUNTER — Encounter (HOSPITAL_COMMUNITY): Payer: Medicare Other

## 2016-08-25 ENCOUNTER — Encounter (HOSPITAL_COMMUNITY): Payer: Self-pay

## 2016-08-25 ENCOUNTER — Encounter (HOSPITAL_COMMUNITY): Payer: Medicare Other | Attending: Oncology

## 2016-08-25 VITALS — BP 98/49 | HR 71 | Temp 97.9°F | Resp 18

## 2016-08-25 DIAGNOSIS — D649 Anemia, unspecified: Secondary | ICD-10-CM

## 2016-08-25 DIAGNOSIS — E538 Deficiency of other specified B group vitamins: Secondary | ICD-10-CM

## 2016-08-25 DIAGNOSIS — Z95828 Presence of other vascular implants and grafts: Secondary | ICD-10-CM

## 2016-08-25 DIAGNOSIS — D509 Iron deficiency anemia, unspecified: Secondary | ICD-10-CM

## 2016-08-25 DIAGNOSIS — C8338 Diffuse large B-cell lymphoma, lymph nodes of multiple sites: Secondary | ICD-10-CM

## 2016-08-25 DIAGNOSIS — J90 Pleural effusion, not elsewhere classified: Secondary | ICD-10-CM

## 2016-08-25 MED ORDER — OXYCODONE HCL 10 MG PO TABS: 10.0000 mg | ORAL_TABLET | Freq: Four times a day (QID) | ORAL | 0 refills | Status: DC | PRN

## 2016-08-25 MED ORDER — CYANOCOBALAMIN 1000 MCG/ML IJ SOLN
1000.0000 ug | Freq: Once | INTRAMUSCULAR | Status: AC
  Administered 2016-08-25: 1000 ug via INTRAMUSCULAR

## 2016-08-25 MED ORDER — CYANOCOBALAMIN 1000 MCG/ML IJ SOLN
INTRAMUSCULAR | Status: AC
  Filled 2016-08-25: qty 1

## 2016-08-25 MED ORDER — SODIUM CHLORIDE 0.9 % IV SOLN
Freq: Once | INTRAVENOUS | Status: AC
  Administered 2016-08-25: 15:00:00 via INTRAVENOUS

## 2016-08-25 MED ORDER — HEPARIN SOD (PORK) LOCK FLUSH 100 UNIT/ML IV SOLN
INTRAVENOUS | Status: AC
  Filled 2016-08-25: qty 5

## 2016-08-25 MED ORDER — SODIUM CHLORIDE 0.9 % IV SOLN
750.0000 mg | Freq: Once | INTRAVENOUS | Status: AC
  Administered 2016-08-25: 750 mg via INTRAVENOUS
  Filled 2016-08-25: qty 15

## 2016-08-25 MED ORDER — HEPARIN SOD (PORK) LOCK FLUSH 100 UNIT/ML IV SOLN
500.0000 [IU] | Freq: Once | INTRAVENOUS | Status: AC
Start: 2016-08-25 — End: 2016-08-25
  Administered 2016-08-25: 500 [IU] via INTRAVENOUS

## 2016-08-25 NOTE — Progress Notes (Signed)
Patient received IV injectafer today per orders. Patient tolerated well without problems.   [name] presents today for injection per MD orders. B12 1,000 administered IM in left Upper Arm. Administration without incident. Patient tolerated well.   Patient discharged via wheelchair with wife present. Vitals stable. Script for pain med refilled.

## 2016-08-25 NOTE — Patient Instructions (Signed)
[place] at [facility] Discharge Instructions  RECOMMENDATIONS MADE BY THE CONSULTANT AND ANY TEST RESULTS WILL BE SENT TO YOUR REFERRING PHYSICIAN.  IV injectafer given today. B12 injection given today. Follow up as scheduled.  Thank you for choosing [place] at [facility] to provide your oncology and hematology care.  To afford each patient quality time with our provider, please arrive at least 15 minutes before your scheduled appointment time.   Beginning January 23rd 2017 lab work for the [facility] will be done in the  Main lab at [facility] on 1st floor. If you have a lab appointment with the [place] please come in thru the  Main Entrance and check in at the main information desk  You need to re-schedule your appointment should you arrive 10 or more minutes late.  We strive to give you quality time with our providers, and arriving late affects you and other patients whose appointments are after yours.  Also, if you no show three or more times for appointments you may be dismissed from the clinic at the providers discretion.     Again, thank you for choosing [facility].  Our hope is that these requests will decrease the amount of time that you wait before being seen by our physicians.       _____________________________________________________________  Should you have questions after your visit to [facility], please contact our office at [phone] between the hours of 8:30 a.m. and 4:30 p.m.  Voicemails left after 4:30 p.m. will not be returned until the following business day.  For prescription refill requests, have your pharmacy contact our office.         Resources For Cancer Patients and their Caregivers ? American Cancer Society: Can assist with transportation, wigs, general needs, runs Look Good Feel Better.        [phone] ? Cancer Care: Provides financial assistance, online  support groups, medication/co-pay assistance.  1-800-813-HOPE (706)443-3479) ? [place] Assists [place] cancer patients and their families through emotional , educational and financial support.  959-619-0498 ? [place] DSS Where to apply for food stamps, Medicaid and utility assistance. 6164539980 ? RCATS: Transportation to medical appointments. 609 438 4106 ? Social Security Administration: May apply for disability if have a Stage IV cancer. [phone] ? [employer], Disability and Transit Services: Assists with nutrition, care and transit needs. [place] Support Programs: @10RELATIVEDAYS @ > Cancer Support Group  2nd Tuesday of the month 1pm-2pm, Journey Room  > Creative Journey  3rd Tuesday of the month 1130am-1pm, Journey Room  > Look Good Feel Better  1st Wednesday of the month 10am-12 noon, Journey Room (Call [place] to register [phone])

## 2016-08-25 NOTE — Progress Notes (Signed)
See other encounter.

## 2016-08-26 LAB — ANTI-PARIETAL ANTIBODY: PARIETAL CELL ANTIBODY-IGG: 2.8 U (ref 0.0–20.0)

## 2016-08-26 LAB — INTRINSIC FACTOR ANTIBODIES: Intrinsic Factor: 1 AU/mL (ref 0.0–1.1)

## 2016-08-26 LAB — HOMOCYSTEINE: Homocysteine: 16.5 umol/L — ABNORMAL HIGH (ref 0.0–15.0)

## 2016-08-31 LAB — METHYLMALONIC ACID, SERUM: Methylmalonic Acid, Quantitative: 309 nmol/L (ref 0–378)

## 2016-09-01 ENCOUNTER — Encounter (HOSPITAL_COMMUNITY): Payer: Self-pay

## 2016-09-01 ENCOUNTER — Other Ambulatory Visit (HOSPITAL_COMMUNITY): Payer: Self-pay | Admitting: Oncology

## 2016-09-01 ENCOUNTER — Encounter (HOSPITAL_COMMUNITY): Payer: Medicare Other | Attending: Hematology & Oncology

## 2016-09-01 VITALS — BP 88/48 | HR 77 | Temp 97.7°F | Resp 16

## 2016-09-01 DIAGNOSIS — D509 Iron deficiency anemia, unspecified: Secondary | ICD-10-CM

## 2016-09-01 DIAGNOSIS — D649 Anemia, unspecified: Secondary | ICD-10-CM | POA: Insufficient documentation

## 2016-09-01 DIAGNOSIS — E538 Deficiency of other specified B group vitamins: Secondary | ICD-10-CM | POA: Insufficient documentation

## 2016-09-01 MED ORDER — FOLIC ACID 1 MG PO TABS: 1.0000 mg | ORAL_TABLET | Freq: Every day | ORAL | 11 refills | Status: DC

## 2016-09-01 MED ORDER — CYANOCOBALAMIN 1000 MCG/ML IJ SOLN
1000.0000 ug | Freq: Once | INTRAMUSCULAR | Status: AC
  Administered 2016-09-01: 1000 ug via INTRAMUSCULAR
  Filled 2016-09-01: qty 1

## 2016-09-01 NOTE — Patient Instructions (Signed)
[place] at [facility] Discharge Instructions  RECOMMENDATIONS MADE BY THE CONSULTANT AND ANY TEST RESULTS WILL BE SENT TO YOUR REFERRING PHYSICIAN.  You were given a B12 injection today. Call your PCP about your low BP.  Take Folic Acid daily.  Return as scheduled.   Thank you for choosing [place] at [facility] to provide your oncology and hematology care.  To afford each patient quality time with our provider, please arrive at least 15 minutes before your scheduled appointment time.   Beginning January 23rd 2017 lab work for the [facility] will be done in the  Main lab at [facility] on 1st floor. If you have a lab appointment with the [place] please come in thru the  Main Entrance and check in at the main information desk  You need to re-schedule your appointment should you arrive 10 or more minutes late.  We strive to give you quality time with our providers, and arriving late affects you and other patients whose appointments are after yours.  Also, if you no show three or more times for appointments you may be dismissed from the clinic at the providers discretion.     Again, thank you for choosing [facility].  Our hope is that these requests will decrease the amount of time that you wait before being seen by our physicians.       _____________________________________________________________  Should you have questions after your visit to [facility], please contact our office at [phone] between the hours of 8:30 a.m. and 4:30 p.m.  Voicemails left after 4:30 p.m. will not be returned until the following business day.  For prescription refill requests, have your pharmacy contact our office.         Resources For Cancer Patients and their Caregivers ? American Cancer Society: Can assist with transportation, wigs, general needs, runs Look Good Feel Better.        [phone] ? Cancer  Care: Provides financial assistance, online support groups, medication/co-pay assistance.  1-800-813-HOPE 863-155-0549) ? [facility] Assists [place] cancer patients and their families through emotional , educational and financial support.  229-075-7639 ? [place] DSS Where to apply for food stamps, Medicaid and utility assistance. 515-706-2592 ? RCATS: Transportation to medical appointments. 236-623-3357 ? Social Security Administration: May apply for disability if have a Stage IV cancer. 802-574-5548 2122846873 ? [employer], Disability and Transit Services: Assists with nutrition, care and transit needs. [place] Support Programs: @10RELATIVEDAYS @ > Cancer Support Group  2nd Tuesday of the month 1pm-2pm, Journey Room  > Creative Journey  3rd Tuesday of the month 1130am-1pm, Journey Room  > Look Good Feel Better  1st Wednesday of the month 10am-12 noon, Journey Room (Call [place] to register [phone])

## 2016-09-01 NOTE — Progress Notes (Signed)
Pt here today for B12 injection. Pt BP low 66/47 and 88/48 manual. I spoke with Kirby Crigler PA-C, he advised ok to give injection and have pt contact PCP about BP.   I advised pt and daughter about above. Pt given injection in right deltoid. Pt tolerated injection well. Pt stable and discharged home with daughter. Return as scheduled.

## 2016-09-07 ENCOUNTER — Encounter (HOSPITAL_COMMUNITY): Payer: Self-pay

## 2016-09-07 ENCOUNTER — Encounter (HOSPITAL_BASED_OUTPATIENT_CLINIC_OR_DEPARTMENT_OTHER): Payer: Medicare Other

## 2016-09-07 VITALS — BP 89/48 | HR 92 | Temp 98.2°F | Resp 16

## 2016-09-07 DIAGNOSIS — Z23 Encounter for immunization: Secondary | ICD-10-CM | POA: Diagnosis not present

## 2016-09-07 DIAGNOSIS — E538 Deficiency of other specified B group vitamins: Secondary | ICD-10-CM | POA: Diagnosis not present

## 2016-09-07 DIAGNOSIS — D509 Iron deficiency anemia, unspecified: Secondary | ICD-10-CM

## 2016-09-07 MED ORDER — INFLUENZA VAC SPLIT QUAD 0.5 ML IM SUSY
0.5000 mL | PREFILLED_SYRINGE | Freq: Once | INTRAMUSCULAR | Status: AC
  Administered 2016-09-07: 0.5 mL via INTRAMUSCULAR
  Filled 2016-09-07: qty 0.5

## 2016-09-07 MED ORDER — CYANOCOBALAMIN 1000 MCG/ML IJ SOLN
1000.0000 ug | Freq: Once | INTRAMUSCULAR | Status: AC
  Administered 2016-09-07: 1000 ug via INTRAMUSCULAR
  Filled 2016-09-07: qty 1

## 2016-09-07 NOTE — Patient Instructions (Signed)
[place] at [facility] Discharge Instructions  RECOMMENDATIONS MADE BY THE CONSULTANT AND ANY TEST RESULTS WILL BE SENT TO YOUR REFERRING PHYSICIAN.  You were given a B12 injection and a flu shot today.  Return as scheduled.    Thank you for choosing [place] at [facility] to provide your oncology and hematology care.  To afford each patient quality time with our provider, please arrive at least 15 minutes before your scheduled appointment time.   Beginning January 23rd 2017 lab work for the [facility] will be done in the  Main lab at [facility] on 1st floor. If you have a lab appointment with the [place] please come in thru the  Main Entrance and check in at the main information desk  You need to re-schedule your appointment should you arrive 10 or more minutes late.  We strive to give you quality time with our providers, and arriving late affects you and other patients whose appointments are after yours.  Also, if you no show three or more times for appointments you may be dismissed from the clinic at the providers discretion.     Again, thank you for choosing [facility].  Our hope is that these requests will decrease the amount of time that you wait before being seen by our physicians.       _____________________________________________________________  Should you have questions after your visit to [facility], please contact our office at [phone] between the hours of 8:30 a.m. and 4:30 p.m.  Voicemails left after 4:30 p.m. will not be returned until the following business day.  For prescription refill requests, have your pharmacy contact our office.         Resources For Cancer Patients and their Caregivers ? American Cancer Society: Can assist with transportation, wigs, general needs, runs Look Good Feel Better.        [phone] ? Cancer Care: Provides financial assistance, online  support groups, medication/co-pay assistance.  1-800-813-HOPE 343-798-1844) ? [place] Assists [place] cancer patients and their families through emotional , educational and financial support.  6614106279 ? [place] DSS Where to apply for food stamps, Medicaid and utility assistance. ([phone] 840-6129 ? RCATS: Transportation to medical appointments. 7788469051 ? Social Security Administration: May apply for disability if have a Stage IV cancer. 208-520-8464 445-009-1376 ? [employer], Disability and Transit Services: Assists with nutrition, care and transit needs. [place] Support Programs: @10RELATIVEDAYS @ > Cancer Support Group  2nd Tuesday of the month 1pm-2pm, Journey Room  > Creative Journey  3rd Tuesday of the month 1130am-1pm, Journey Room  > Look Good Feel Better  1st Wednesday of the month 10am-12 noon, Journey Room (Call [place] to register [phone])

## 2016-09-07 NOTE — Progress Notes (Signed)
Pt here today for B12 injection. Pt also wanted to have flu shot today. Pt given B12 injection in left deltoid and flu shot in right deltoid. Pt tolerated well. Pt stable and discharged home with daughter. Pt to return as scheduled.

## 2016-09-15 ENCOUNTER — Encounter (HOSPITAL_BASED_OUTPATIENT_CLINIC_OR_DEPARTMENT_OTHER): Payer: Medicare Other | Admitting: Oncology

## 2016-09-15 ENCOUNTER — Encounter (HOSPITAL_BASED_OUTPATIENT_CLINIC_OR_DEPARTMENT_OTHER): Payer: Medicare Other

## 2016-09-15 ENCOUNTER — Other Ambulatory Visit (HOSPITAL_COMMUNITY): Payer: Medicare Other

## 2016-09-15 ENCOUNTER — Encounter (HOSPITAL_COMMUNITY): Payer: Self-pay | Admitting: Oncology

## 2016-09-15 VITALS — BP 95/45 | Temp 97.8°F | Resp 16 | Wt 194.1 lb

## 2016-09-15 DIAGNOSIS — D509 Iron deficiency anemia, unspecified: Secondary | ICD-10-CM

## 2016-09-15 DIAGNOSIS — D649 Anemia, unspecified: Secondary | ICD-10-CM

## 2016-09-15 DIAGNOSIS — M069 Rheumatoid arthritis, unspecified: Secondary | ICD-10-CM

## 2016-09-15 DIAGNOSIS — Z95828 Presence of other vascular implants and grafts: Secondary | ICD-10-CM

## 2016-09-15 DIAGNOSIS — E538 Deficiency of other specified B group vitamins: Secondary | ICD-10-CM

## 2016-09-15 DIAGNOSIS — C8338 Diffuse large B-cell lymphoma, lymph nodes of multiple sites: Secondary | ICD-10-CM

## 2016-09-15 LAB — CBC WITH DIFFERENTIAL/PLATELET
BASOS ABS: 0 10*3/uL (ref 0.0–0.1)
BASOS PCT: 1 %
EOS PCT: 6 %
Eosinophils Absolute: 0.4 10*3/uL (ref 0.0–0.7)
HCT: 28.2 % — ABNORMAL LOW (ref 39.0–52.0)
Hemoglobin: 8.4 g/dL — ABNORMAL LOW (ref 13.0–17.0)
LYMPHS ABS: 1.2 10*3/uL (ref 0.7–4.0)
LYMPHS PCT: 21 %
MCH: 28.6 pg (ref 26.0–34.0)
MCHC: 29.8 g/dL — ABNORMAL LOW (ref 30.0–36.0)
MCV: 95.9 fL (ref 78.0–100.0)
Monocytes Absolute: 0.6 10*3/uL (ref 0.1–1.0)
Monocytes Relative: 10 %
NEUTROS ABS: 3.6 10*3/uL (ref 1.7–7.7)
Neutrophils Relative %: 62 %
PLATELETS: 146 10*3/uL — AB (ref 150–400)
RBC: 2.94 MIL/uL — ABNORMAL LOW (ref 4.22–5.81)
RDW: 16.2 % — AB (ref 11.5–15.5)
WBC: 5.8 10*3/uL (ref 4.0–10.5)

## 2016-09-15 LAB — COMPREHENSIVE METABOLIC PANEL
ALK PHOS: 58 U/L (ref 38–126)
ALT: 7 U/L — AB (ref 17–63)
AST: 13 U/L — ABNORMAL LOW (ref 15–41)
Albumin: 3.1 g/dL — ABNORMAL LOW (ref 3.5–5.0)
Anion gap: 4 — ABNORMAL LOW (ref 5–15)
BUN: 12 mg/dL (ref 6–20)
CALCIUM: 8.3 mg/dL — AB (ref 8.9–10.3)
CO2: 28 mmol/L (ref 22–32)
CREATININE: 1.09 mg/dL (ref 0.61–1.24)
Chloride: 104 mmol/L (ref 101–111)
Glucose, Bld: 84 mg/dL (ref 65–99)
Potassium: 3.9 mmol/L (ref 3.5–5.1)
Sodium: 136 mmol/L (ref 135–145)
Total Bilirubin: 0.6 mg/dL (ref 0.3–1.2)
Total Protein: 6.7 g/dL (ref 6.5–8.1)

## 2016-09-15 LAB — IRON AND TIBC
IRON: 26 ug/dL — AB (ref 45–182)
SATURATION RATIOS: 9 % — AB (ref 17.9–39.5)
TIBC: 280 ug/dL (ref 250–450)
UIBC: 254 ug/dL

## 2016-09-15 LAB — SEDIMENTATION RATE: Sed Rate: 43 mm/hr — ABNORMAL HIGH (ref 0–16)

## 2016-09-15 LAB — C-REACTIVE PROTEIN: CRP: 1 mg/dL — ABNORMAL HIGH (ref <1.0)

## 2016-09-15 LAB — LACTATE DEHYDROGENASE: LDH: 106 U/L (ref 98–192)

## 2016-09-15 LAB — FOLATE: Folate: 26.9 ng/mL (ref >5.9)

## 2016-09-15 LAB — FERRITIN: Ferritin: 60 ng/mL (ref 24–336)

## 2016-09-15 MED ORDER — CYANOCOBALAMIN 1000 MCG/ML IJ SOLN
1000.0000 ug | Freq: Once | INTRAMUSCULAR | Status: AC
  Administered 2016-09-15: 1000 ug via INTRAMUSCULAR

## 2016-09-15 MED ORDER — CYANOCOBALAMIN 1000 MCG/ML IJ SOLN
INTRAMUSCULAR | Status: AC
  Filled 2016-09-15: qty 1

## 2016-09-15 MED ORDER — HEPARIN SOD (PORK) LOCK FLUSH 100 UNIT/ML IV SOLN: 500.0000 [IU] | Freq: Once | INTRAVENOUS | Status: AC

## 2016-09-15 MED ORDER — HEPARIN SOD (PORK) LOCK FLUSH 100 UNIT/ML IV SOLN
INTRAVENOUS | Status: AC
  Filled 2016-09-15: qty 5

## 2016-09-15 MED ORDER — SODIUM CHLORIDE 0.9% FLUSH: 10.0000 mL | INTRAVENOUS | Status: AC | PRN

## 2016-09-15 NOTE — Patient Instructions (Signed)
[facility] at [facility] Discharge Instructions  RECOMMENDATIONS MADE BY THE CONSULTANT AND ANY TEST RESULTS WILL BE SENT TO YOUR REFERRING PHYSICIAN.   You were given a B12 injection today. Return as scheduled.   Thank you for choosing  [facility] at [facility] to provide your oncology and hematology care.  To afford each patient quality time with our provider, please arrive at least 15 minutes before your scheduled appointment time.   Beginning January 23rd 2017 lab work for the Cancer Center will be done in the  Main lab at [place] on 1st floor. If you have a lab appointment with the Cancer Center please come in thru the  Main Entrance and check in at the main information desk  You need to re-schedule your appointment should you arrive 10 or more minutes late.  We strive to give you quality time with our providers, and arriving late affects you and other patients whose appointments are after yours.  Also, if you no show three or more times for appointments you may be dismissed from the clinic at the providers discretion.     Again, thank you for choosing [facility].  Our hope is that these requests will decrease the amount of time that you wait before being seen by our physicians.       _____________________________________________________________  Should you have questions after your visit to [facility], please contact our office at [phone] between the hours of 8:30 a.m. and 4:30 p.m.  Voicemails left after 4:30 p.m. will not be returned until the following business day.  For prescription refill requests, have your pharmacy contact our office.         Resources For Cancer Patients and their Caregivers ? American Cancer Society: Can assist with transportation, wigs, general needs, runs Look Good Feel Better.        1-888-227-6333 ? Cancer Care: Provides financial assistance, online support groups,  medication/co-pay assistance.  1-800-813-HOPE (4673) ? [facility] Assists [place] cancer patients and their families through emotional , educational and financial support.  336-427-4357 ? [place] DSS Where to apply for food stamps, Medicaid and utility assistance. 336-342-1394 ? RCATS: Transportation to medical appointments. 336-347-2287 ? Social Security Administration: May apply for disability if have a Stage IV cancer. 336-342-7796 1-800-772-1213 ? [place] Aging, Disability and Transit Services: Assists with nutrition, care and transit needs. 336-349-2343  Cancer Center Support Programs: @10RELATIVEDAYS@ > Cancer Support Group  2nd Tuesday of the month 1pm-2pm, Journey Room  > Creative Journey  3rd Tuesday of the month 1130am-1pm, Journey Room  > Look Good Feel Better  1st Wednesday of the month 10am-12 noon, Journey Room (Call [employer] to register [phone])   

## 2016-09-15 NOTE — Progress Notes (Signed)
Pt here today for B12 injection and visit. Pt given B12 injection in right deltoid. Pt tolerated well. Pt stable and discharged home with daughter.  Return as scheduled.

## 2016-09-15 NOTE — Patient Instructions (Signed)
[place] at [facility] Discharge Instructions  RECOMMENDATIONS MADE BY THE CONSULTANT AND ANY TEST RESULTS WILL BE SENT TO YOUR REFERRING PHYSICIAN.  You seen by Gershon Mussel today. B12 monthly Refer to Dr Jamey Reas for Arthritis Return for follow up and labs in 8 weeks  Thank you for choosing [place] at [facility] to provide your oncology and hematology care.  To afford each patient quality time with our provider, please arrive at least 15 minutes before your scheduled appointment time.   Beginning January 23rd 2017 lab work for the [facility] will be done in the  Main lab at [facility] on 1st floor. If you have a lab appointment with the [place] please come in thru the  Main Entrance and check in at the main information desk  You need to re-schedule your appointment should you arrive 10 or more minutes late.  We strive to give you quality time with our providers, and arriving late affects you and other patients whose appointments are after yours.  Also, if you no show three or more times for appointments you may be dismissed from the clinic at the providers discretion.     Again, thank you for choosing [facility].  Our hope is that these requests will decrease the amount of time that you wait before being seen by our physicians.       _____________________________________________________________  Should you have questions after your visit to [facility], please contact our office at [phone] between the hours of 8:30 a.m. and 4:30 p.m.  Voicemails left after 4:30 p.m. will not be returned until the following business day.  For prescription refill requests, have your pharmacy contact our office.         Resources For Cancer Patients and their Caregivers ? American Cancer Society: Can assist with transportation, wigs, general needs, runs Look Good Feel Better.        743-339-7016 ? Cancer  Care: Provides financial assistance, online support groups, medication/co-pay assistance.  1-800-813-HOPE 517-859-4230) ? [place] Assists [place] cancer patients and their families through emotional , educational and financial support.  ([phone] ? [place] DSS Where to apply for food stamps, Medicaid and utility assistance. 309-582-5491 ? RCATS: Transportation to medical appointments. 219-084-7115 ? Social Security Administration: May apply for disability if have a Stage IV cancer. 901-385-5156 279-275-2502 ? [employer], Disability and Transit Services: Assists with nutrition, care and transit needs. [place] Support Programs: @10RELATIVEDAYS @ > Cancer Support Group  2nd Tuesday of the month 1pm-2pm, Journey Room  > Creative Journey  3rd Tuesday of the month 1130am-1pm, Journey Room  > Look Good Feel Better  1st Wednesday of the month 10am-12 noon, Journey Room (Call [place] to register [phone])

## 2016-09-15 NOTE — Assessment & Plan Note (Addendum)
B12 deficiency, on B12 replacement IM.  Completed weekly injections x 4.  Now starting monthly injections.  Supportive therapy plan is reviewed.  B12 injection today.

## 2016-09-15 NOTE — Addendum Note (Signed)
Addended by: Farley Ly on: 09/15/2016 03:09 PM   Modules accepted: Orders, SmartSet

## 2016-09-15 NOTE — Assessment & Plan Note (Addendum)
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  He is S/P 1 cycle of R-mini-CHOP but issues with tolerance was appreciated.  His treatment was transitioned to palliative management with single-agent Rituxan.  Despite bone marrow involvement, he is not a candidate for IT therapy given high likelihood of intolerance.  Oncology history is updated.  Labs today: CBC diff, CMET, LDH, ESR, CRP, anemia panel.  I personally reviewed and went over laboratory results with the patient.  The results are noted within this dictation.  HGB is 8.4 g/dL.  No indication for hemolysis.  I will repeat CBC and order stool cards x 3.   His RA, symptomatically, is the patient's biggest complaint.  I will refer him back to his rheumatologist, Dr. Charlestine Night, to discuss treatment options.  Return in 2 months for follow-up.

## 2016-09-15 NOTE — Assessment & Plan Note (Signed)
Folate deficiency.  On PO replacement.

## 2016-09-15 NOTE — Progress Notes (Signed)
Tony Mustache, MD [place] [id number]  Diffuse large B-cell lymphoma of lymph nodes of multiple regions ([place]) - Plan: CBC with Differential, Comprehensive metabolic panel, Lactate dehydrogenase, Sedimentation rate, C-reactive protein, CBC with Differential, Comprehensive metabolic panel, Lactate dehydrogenase, CBC with Differential, Comprehensive metabolic panel, Lactate dehydrogenase, Sedimentation rate, C-reactive protein  Iron deficiency anemia - Plan: CBC with Differential, Iron and TIBC, Ferritin, Folate, CBC with Differential, Iron and TIBC, Ferritin, CBC with Differential, Iron and TIBC, Ferritin, Folate  B12 deficiency  Folate deficiency  CURRENT THERAPY: Surveillance  INTERVAL HISTORY: [name] 82 y.o. 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  He is S/P 1 cycle of R-mini-CHOP but issues with tolerance was appreciated.  His treatment was transitioned to palliative management with single-agent Rituxan.  Despite bone marrow involvement, he is not a candidate for IT therapy given high likelihood of intolerance. AND Significant anemia, in the setting of RA, requiring additional work-up today.    Diffuse large B cell lymphoma ([place])   08/22/2015 - 08/25/2015 Hospital Admission    Acute respiratory distress      08/22/2015 Imaging    CTA chest- Right infrahilar lymph node versus central lung mass measuring 2.8 x 4.1 cm. Worsening mediastinal adenopathy and bilateral hilar adenopathy as described with the largest node over the subcarinal region measuring 2.7 cm by short axis...      08/24/2015 Pathology Results    Diagnosis PLEURAL FLUID, RIGHT(SPECIMEN 1 OF 1 COLLECTED 08/24/15): ATYPICAL LYMPHOCYTES SUSPICIOUS FOR A LYMPHOPROLIFERATIVE PROCESS, SEE COMMENT. [name]      08/25/2015 Imaging    CT abd/pelvis- Mild upper retroperitoneal (gastrohepatic ligament and right retrocrural)  lymphadenopathy. Cirrhosis.      09/03/2015 PET scan    Widespread metastatic adenopathy including the RIGHT cervical lymph nodes, LEFT axillary lymph nodes, mediastinal lymph, hilar lymph node, retroperitoneal lymph nodes and iliac lymph nodes and inguinal lymph nodes. Inguinal lymph nodes may be most....      09/11/2015 Pathology Results    Diagnosis Lymph node, biopsy, right inguinal - DIFFUSE LARGE B CELL LYMPHOMA.      09/11/2015 Pathology Results    Interpretation Tissue-Flow Cytometry - MONOCLONAL B-CELL POPULATION IDENTIFIED. Diagnosis Comment: The findings are consistent with non-Hodgkin B-cell lymphoma. (BNS:ecj 09/16/2015)      09/13/2015 - 09/18/2015 Hospital Admission    COPD exacerbation      09/18/2015 Bone Marrow Biopsy    Bone Marrow, Aspirate,Biopsy, and Clot, left iliac crest: Despite limited findings, the features are worrisome for minimal involvement by B cell lymphoproliferative process, particularly given the previous history of large B cell lymphoma.       09/21/2015 - 10/11/2015 Chemotherapy    Mini-R-CHOP      09/29/2015 - 10/02/2015 Hospital Admission    SOB, anemia, dehydration, recurrent pleural effusion (right) Direct admit from [facility].  S/p 5 units of PRBCs, therapeutic thoracentesis on right relieveing 1.5 L of fluid, adjustment of pain medication.      10/12/2015 Treatment Plan Change    Patient intolerant to R-mini-CHOP.  Trantition to palliative treatment with single-agent Rituxan.      10/12/2015 - 11/02/2015 Chemotherapy    Rituxan single-agent      10/18/2015 - 10/21/2015 Hospital Admission    1.   Acute encephalopathy 2.   New onset seizure disorder      10/19/2015 Imaging    EEG- This recording is abnormal for the following  reasons: Single epileptiform discharge involving the right anterior temporal area. This can correlates clinically with focal seizures.      11/28/2015 - 12/03/2015 Hospital Admission    Hospital Admission for BRBPR       07/15/2016 PET scan    Complete interval metabolic response to therapy. No metabolically active lymphoma identified. Persistent moderate right pleural effusion, right lung atelectasis, and emphysema.      07/28/2016 Procedure    A total of approximately 1.3 L of RIGHT pleural fluid was removed.      08/02/2016 Pathology Results    PLEURAL FLUID, RIGHT (SPECIMEN 1 OF 1 COLLECTED 07/28/16): - REACTIVE MESOTHELIAL CELLS PRESENT ADMIXED WITH LYMPHOCYTES. Tissue-Flow Cytometry - NO MONOCLONAL B CELL POPULATION IDENTIFIED. - T CELLS WITH NONSPECIFIC CHANGES.       He report joint pain, diffusely.  He notes that in the AM, his pain is not bad, but as the day progresses, it worsens significantly.  This is his only complaint.  He denies any B symptoms.  Review of Systems  Constitutional: Negative.  Negative for chills, fever and weight loss.  HENT: Negative.   Eyes: Negative.   Respiratory: Negative for cough.   Cardiovascular: Negative.  Negative for chest pain.  Gastrointestinal: Negative.  Negative for blood in stool and melena.  Genitourinary: Negative.  Negative for dysuria.  Musculoskeletal: Positive for joint pain.  Skin: Negative.   Neurological: Negative.  Negative for weakness.  Endo/Heme/Allergies: Negative.  Does not bruise/bleed easily.  Psychiatric/Behavioral: Negative.     Past Medical History:  Diagnosis Date  . Anemia   . Aortic stenosis, moderate 05/21/2015  . Asthma   . B12 deficiency 07/28/2016  . Cellulitis of leg, left 01/03/2015  . Cervical spondylosis without myelopathy   . Cholelithiasis   . Chronic atrial fibrillation ([place])   . Chronic diastolic heart failure ([place])   . Chronic lower back pain   . Cirrhosis ([place])    Questionable, AFP normal Feb 01/2012, hx of ETOH use   . COPD (chronic obstructive pulmonary disease) ([facility])    Uses occasional nighttime O2  . Diffuse large B cell lymphoma ([place]) 08/22/2015  . Disseminated herpes zoster 2010  . DNR (do not  resuscitate) 11/01/2015  . Dysrhythmia    chronic AFib  . Essential hypertension, benign   . GERD (gastroesophageal reflux disease)   . Headache(784.0)   . History of chicken pox 1941; 2011  . Iron deficiency anemia 01/18/2012  . Lupus anticoagulant positive   . Mixed hyperlipidemia   . Pulmonary embolus ([place])    2003 and 2011  . Pulmonary nodules    Chest CT 09/11  . Rectal ulcer 12/01/2015  . Rheumatoid arthritis(714.0)   . Seizure disorder ([place]) 09/2015  . Stage III chronic kidney disease   . Telangiectasia 11/29/2015   ANTRUM AND JEJUNUM    Past Surgical History:  Procedure Laterality Date  . BACK SURGERY    . CATARACT EXTRACTION W/ INTRAOCULAR LENS  IMPLANT, BILATERAL    . CHOLECYSTECTOMY  05/23/2012   Procedure: LAPAROSCOPIC CHOLECYSTECTOMY WITH INTRAOPERATIVE CHOLANGIOGRAM;  Surgeon: Stark Klein, MD;  Location: [facility];  Service: General;  Laterality: N/A;  . COLONOSCOPY  01/24/2013   XBM:WUXLKGM polyps/colonic diverticulosis  . COLONOSCOPY N/A 12/01/2015   Procedure: COLONOSCOPY;  Surgeon: Danie Binder, MD;  [facility];  Service: Endoscopy;  Laterality: N/A;  . ESOPHAGOGASTRODUODENOSCOPY  02/02/12   WNU:UVOZDGUY size hiatal hernia; otherwise normal exam  . ESOPHAGOGASTRODUODENOSCOPY N/A 11/29/2015   QIH:KVQQVZDG  diverticulosis/small internal hemorrhoids/left colon is redundant  . LYMPH NODE BIOPSY    . LYMPH NODE BIOPSY Right 09/11/2015   Procedure: RIGHT INGUINAL LYMPH NODE BIOPSY;  Surgeon: Aviva Signs, MD;  Location: [facility];  Service: General;  Laterality: Right;  . MYRINGOTOMY     "3 times; both ears"  . POLYPECTOMY  02/02/2012   RMR: Multiple colonic polyps removed, flat, tubular adenomas/ Left-sided diverticulosis  . PORTACATH PLACEMENT    . PORTACATH PLACEMENT Left 09/11/2015   Procedure: INSERTION PORT-A-CATH;  Surgeon: Aviva Signs, MD;  Location: [facility];  Service: General;  Laterality: Left;  . POSTERIOR LUMBAR VERTEBRAE EXCISION  2003; 2009; 2011     Family History  Problem Relation Age of Onset  . Diabetes Mother   . Colon cancer Neg Hx   . Liver disease Neg Hx     Social History   Social History  . Marital status: Widowed    Spouse name: N/A  . Number of children: 6  . Years of education: 7th   Occupational History  . retired Retired  . RETIRED Retired   Social History Main Topics  . Smoking status: Former Smoker    Packs/day: 1.00    Years: 57.00    Types: Cigarettes    Quit date: 01/27/1998  . Smokeless tobacco: Never Used  . Alcohol use No     Comment: "quit drinking 1976"  . Drug use: No  . Sexual activity: Not Currently   Other Topics Concern  . None   Social History Narrative  . None     PHYSICAL EXAMINATION  ECOG PERFORMANCE STATUS: 2 - Symptomatic, <50% confined to bed  Vitals:   09/15/16 1300  BP: (!) 95/45  Resp: 16  Temp: 97.8 F (36.6 C)    GENERAL:alert, no distress, cooperative and accompanied by daughter, in wheelchair. SKIN: skin color, texture, turgor are normal. HEAD: Normocephalic EYES: normal, EOMI EARS: External ears normal OROPHARYNX:mucous membranes are moist  NECK: supple, trachea midline LYMPH:  not examined BREAST:not examined LUNGS: positive findings: decreased breath sounds bilaterally. HEART: irregularly irregular ABDOMEN:abdomen soft BACK: Back symmetric, no curvature. EXTREMITIES:less then 2 second capillary refill, no joint deformities, effusion, or inflammation, no skin discoloration  NEURO: alert & oriented x 3 with fluent speech, in wheelchair   LABORATORY DATA: CBC    Component Value Date/Time   WBC 5.8 09/15/2016 1500   RBC 2.94 (L) 09/15/2016 1500   HGB 8.4 (L) 09/15/2016 1500   HCT 28.2 (L) 09/15/2016 1500   HCT 34 01/11/2012 1007   PLT 146 (L) 09/15/2016 1500   MCV 95.9 09/15/2016 1500   MCV 92.0 01/11/2012 1007   MCH 28.6 09/15/2016 1500   MCHC 29.8 (L) 09/15/2016 1500   RDW 16.2 (H) 09/15/2016 1500   LYMPHSABS 1.2 09/15/2016 1500    MONOABS 0.6 09/15/2016 1500   EOSABS 0.4 09/15/2016 1500   BASOSABS 0.0 09/15/2016 1500      Chemistry      Component Value Date/Time   NA 136 09/15/2016 1500   NA 140 01/11/2012 1005   K 3.9 09/15/2016 1500   K 4.5 01/11/2012 1005   CL 104 09/15/2016 1500   CO2 28 09/15/2016 1500   BUN 12 09/15/2016 1500   BUN 12 01/11/2012 1005   CREATININE 1.09 09/15/2016 1500   CREATININE 1.17 01/11/2012 1005      Component Value Date/Time   CALCIUM 8.3 (L) 09/15/2016 1500   CALCIUM 9.2 01/11/2012 1005   ALKPHOS 58 09/15/2016 1500  AST 13 (L) 09/15/2016 1500   ALT 7 (L) 09/15/2016 1500   BILITOT 0.6 09/15/2016 1500        PENDING LABS:   RADIOGRAPHIC STUDIES:  No results found.   PATHOLOGY:    ASSESSMENT AND PLAN:  Diffuse large B cell lymphoma ([facility]) Stage IV DLBCL with positive involvement of bone marrow.  He is S/P 1 cycle of R-mini-CHOP but issues with tolerance was appreciated.  His treatment was transitioned to palliative management with single-agent Rituxan.  Despite bone marrow involvement, he is not a candidate for IT therapy given high likelihood of intolerance.  Oncology history is updated.  Labs today: CBC diff, CMET, LDH, ESR, CRP, anemia panel.  I personally reviewed and went over laboratory results with the patient.  The results are noted within this dictation.  HGB is 8.4 g/dL.  No indication for hemolysis.  I will repeat CBC and order stool cards x 3.   His RA, symptomatically, is the patient's biggest complaint.  I will refer him back to his rheumatologist, Dr. Charlestine Night, to discuss treatment options.  Return in 2 months for follow-up.  B12 deficiency B12 deficiency, on B12 replacement IM.  Completed weekly injections x 4.  Now starting monthly injections.  Supportive therapy plan is reviewed.  B12 injection today.  Folate deficiency Folate deficiency.  On PO replacement.   ORDERS PLACED FOR THIS ENCOUNTER: Orders Placed This Encounter  Procedures   . CBC with Differential  . Iron and TIBC  . Ferritin  . Folate  . Comprehensive metabolic panel  . Lactate dehydrogenase  . Sedimentation rate  . C-reactive protein  . CBC with Differential  . Comprehensive metabolic panel  . Lactate dehydrogenase  . Iron and TIBC  . Ferritin    MEDICATIONS PRESCRIBED THIS ENCOUNTER: Meds ordered this encounter  Medications  . allopurinol (ZYLOPRIM) 300 MG tablet    Sig: TAKE ONE TABLET BY MOUTH DAILY  . methocarbamol (ROBAXIN) 500 MG tablet    Sig: TAKE ONE TABLET BY MOUTH THREE TIMES DAILY AS NEEDED  . diltiazem (CARDIZEM) 120 MG tablet    Sig: Take 120 mg by mouth daily.    Refill:  3    THERAPY PLAN:  We will work-up acute onset of significant anemia.  All questions were answered. The patient knows to call the clinic with any problems, questions or concerns. We can certainly see the patient much sooner if necessary.  Patient and plan discussed with Dr. Ancil Linsey and she is in agreement with the aforementioned.   This note is electronically signed by: Doy Mince 09/15/2016 5:20 PM

## 2016-09-17 ENCOUNTER — Other Ambulatory Visit (HOSPITAL_COMMUNITY): Payer: Self-pay | Admitting: Oncology

## 2016-09-19 ENCOUNTER — Telehealth (HOSPITAL_COMMUNITY): Payer: Self-pay | Admitting: *Deleted

## 2016-09-19 NOTE — Telephone Encounter (Signed)
-----   Message from [facility], PA-C sent at 09/17/2016 10:53 PM EDT ----- Please set patient up for 1 doses of IV Injectafer (ferric carboxymaltose) 750 mg.  Calculated iron deficit is ~ 1050 mg with a target HGB of 14 g/dL.  Supportive therapy plan is built.

## 2016-09-19 NOTE — Telephone Encounter (Signed)
Pt aware that he will need to come in for IV iron.

## 2016-09-23 ENCOUNTER — Encounter (HOSPITAL_BASED_OUTPATIENT_CLINIC_OR_DEPARTMENT_OTHER): Payer: Medicare Other

## 2016-09-23 VITALS — BP 98/54 | HR 88 | Temp 97.5°F | Resp 20

## 2016-09-23 DIAGNOSIS — D649 Anemia, unspecified: Secondary | ICD-10-CM

## 2016-09-23 DIAGNOSIS — E538 Deficiency of other specified B group vitamins: Secondary | ICD-10-CM

## 2016-09-23 DIAGNOSIS — Z95828 Presence of other vascular implants and grafts: Secondary | ICD-10-CM

## 2016-09-23 DIAGNOSIS — D509 Iron deficiency anemia, unspecified: Secondary | ICD-10-CM | POA: Diagnosis not present

## 2016-09-23 DIAGNOSIS — C8338 Diffuse large B-cell lymphoma, lymph nodes of multiple sites: Secondary | ICD-10-CM

## 2016-09-23 DIAGNOSIS — J90 Pleural effusion, not elsewhere classified: Secondary | ICD-10-CM

## 2016-09-23 MED ORDER — HEPARIN SOD (PORK) LOCK FLUSH 100 UNIT/ML IV SOLN
500.0000 [IU] | Freq: Once | INTRAVENOUS | Status: AC
  Administered 2016-09-23: 500 [IU] via INTRAVENOUS

## 2016-09-23 MED ORDER — HEPARIN SOD (PORK) LOCK FLUSH 100 UNIT/ML IV SOLN
INTRAVENOUS | Status: AC
  Filled 2016-09-23: qty 5

## 2016-09-23 MED ORDER — SODIUM CHLORIDE 0.9% FLUSH
10.0000 mL | INTRAVENOUS | Status: DC | PRN
  Administered 2016-09-23: 10 mL via INTRAVENOUS
  Filled 2016-09-23: qty 10

## 2016-09-23 MED ORDER — SODIUM CHLORIDE 0.9 % IV SOLN
Freq: Once | INTRAVENOUS | Status: AC
  Administered 2016-09-23: 13:00:00 via INTRAVENOUS

## 2016-09-23 MED ORDER — OXYCODONE HCL 10 MG PO TABS: 10.0000 mg | ORAL_TABLET | Freq: Four times a day (QID) | ORAL | 0 refills | Status: DC | PRN

## 2016-09-23 MED ORDER — SODIUM CHLORIDE 0.9 % IV SOLN
750.0000 mg | Freq: Once | INTRAVENOUS | Status: AC
  Administered 2016-09-23: 750 mg via INTRAVENOUS
  Filled 2016-09-23: qty 15

## 2016-09-23 NOTE — Patient Instructions (Signed)
[facility] at [facility] Discharge Instructions  RECOMMENDATIONS MADE BY THE CONSULTANT AND ANY TEST RESULTS WILL BE SENT TO YOUR REFERRING PHYSICIAN.  Received Iron infusion today. Follow-up as scheduled. Call clinic for any questions or concerns  Thank you for choosing [facility] at [facility] to provide your oncology and hematology care.  To afford each patient quality time with our provider, please arrive at least 15 minutes before your scheduled appointment time.   Beginning January 23rd 2017 lab work for the Cancer Center will be done in the  Main lab at [facility] on 1st floor. If you have a lab appointment with the Cancer Center please come in thru the  Main Entrance and check in at the main information desk  You need to re-schedule your appointment should you arrive 10 or more minutes late.  We strive to give you quality time with our providers, and arriving late affects you and other patients whose appointments are after yours.  Also, if you no show three or more times for appointments you may be dismissed from the clinic at the providers discretion.     Again, thank you for choosing [facility].  Our hope is that these requests will decrease the amount of time that you wait before being seen by our physicians.       _____________________________________________________________  Should you have questions after your visit to [facility], please contact our office at [phone] between the hours of 8:30 a.m. and 4:30 p.m.  Voicemails left after 4:30 p.m. will not be returned until the following business day.  For prescription refill requests, have your pharmacy contact our office.         Resources For Cancer Patients and their Caregivers ? American Cancer Society: Can assist with transportation, wigs, general needs, runs Look Good Feel Better.        1-888-227-6333 ? Cancer Care: Provides financial  assistance, online support groups, medication/co-pay assistance.  1-800-813-HOPE (4673) ? [facility] Assists [place] cancer patients and their families through emotional , educational and financial support.  336-427-4357 ? [place] DSS Where to apply for food stamps, Medicaid and utility assistance. 336-342-1394 ? RCATS: Transportation to medical appointments. 336-347-2287 ? Social Security Administration: May apply for disability if have a Stage IV cancer. 336-342-7796 1-800-772-1213 ? [place] Aging, Disability and Transit Services: Assists with nutrition, care and transit needs. 336-349-2343  Cancer Center Support Programs: @10RELATIVEDAYS@ > Cancer Support Group  2nd Tuesday of the month 1pm-2pm, Journey Room  > Creative Journey  3rd Tuesday of the month 1130am-1pm, Journey Room  > Look Good Feel Better  1st Wednesday of the month 10am-12 noon, Journey Room (Call American Cancer Society to register 1-800-395-5775)   

## 2016-09-23 NOTE — Progress Notes (Signed)
[name] tolerated Iron infusion well without complaints or incident.Port flushed per protocol after infusion complete. VSS upon discharge. Pt discharged via wheelchair in satisfactory condition with family member

## 2016-10-14 ENCOUNTER — Encounter (HOSPITAL_COMMUNITY): Payer: Medicare Other | Attending: Hematology & Oncology

## 2016-10-14 VITALS — BP 96/50 | HR 88 | Temp 98.0°F | Resp 18

## 2016-10-14 DIAGNOSIS — E538 Deficiency of other specified B group vitamins: Secondary | ICD-10-CM | POA: Diagnosis not present

## 2016-10-14 DIAGNOSIS — D649 Anemia, unspecified: Secondary | ICD-10-CM | POA: Insufficient documentation

## 2016-10-14 DIAGNOSIS — D508 Other iron deficiency anemias: Secondary | ICD-10-CM

## 2016-10-14 MED ORDER — CYANOCOBALAMIN 1000 MCG/ML IJ SOLN
INTRAMUSCULAR | Status: AC
  Filled 2016-10-14: qty 1

## 2016-10-14 MED ORDER — CYANOCOBALAMIN 1000 MCG/ML IJ SOLN
1000.0000 ug | Freq: Once | INTRAMUSCULAR | Status: AC
  Administered 2016-10-14: 1000 ug via INTRAMUSCULAR

## 2016-10-14 NOTE — Patient Instructions (Signed)
[facility] at [facility] Discharge Instructions  RECOMMENDATIONS MADE BY THE CONSULTANT AND ANY TEST RESULTS WILL BE SENT TO YOUR REFERRING PHYSICIAN.  B12 injection  Thank you for choosing [facility] at [facility] to provide your oncology and hematology care.  To afford each patient quality time with our provider, please arrive at least 15 minutes before your scheduled appointment time.   Beginning January 23rd 2017 lab work for the Cancer Center will be done in the  Main lab at [facility] on 1st floor. If you have a lab appointment with the Cancer Center please come in thru the  Main Entrance and check in at the main information desk  You need to re-schedule your appointment should you arrive 10 or more minutes late.  We strive to give you quality time with our providers, and arriving late affects you and other patients whose appointments are after yours.  Also, if you no show three or more times for appointments you may be dismissed from the clinic at the providers discretion.     Again, thank you for choosing [facility].  Our hope is that these requests will decrease the amount of time that you wait before being seen by our physicians.       _____________________________________________________________  Should you have questions after your visit to  Cancer Center, please contact our office at [phone] between the hours of 8:30 a.m. and 4:30 p.m.  Voicemails left after 4:30 p.m. will not be returned until the following business day.  For prescription refill requests, have your pharmacy contact our office.         Resources For Cancer Patients and their Caregivers ? American Cancer Society: Can assist with transportation, wigs, general needs, runs Look Good Feel Better.        1-888-227-6333 ? Cancer Care: Provides financial assistance, online support groups, medication/co-pay assistance.  1-800-813-HOPE  (4673) ? [facility] Assists [place] cancer patients and their families through emotional , educational and financial support.  336-427-4357 ? [place] DSS Where to apply for food stamps, Medicaid and utility assistance. 336-342-1394 ? RCATS: Transportation to medical appointments. 336-347-2287 ? Social Security Administration: May apply for disability if have a Stage IV cancer. 336-342-7796 1-800-772-1213 ? [place] Aging, Disability and Transit Services: Assists with nutrition, care and transit needs. 336-349-2343  Cancer Center Support Programs: @10RELATIVEDAYS@ > Cancer Support Group  2nd Tuesday of the month 1pm-2pm, Journey Room  > Creative Journey  3rd Tuesday of the month 1130am-1pm, Journey Room  > Look Good Feel Better  1st Wednesday of the month 10am-12 noon, Journey Room (Call American Cancer Society to register 1-800-395-5775)   

## 2016-10-14 NOTE — Progress Notes (Signed)
[name] presents today for injection per MD orders. B12 1012mcg administered IM in left Upper Arm. Administration without incident. Patient tolerated well.

## 2016-11-07 ENCOUNTER — Inpatient Hospital Stay (HOSPITAL_COMMUNITY)
Admission: EM | Admit: 2016-11-07 | Discharge: 2016-11-15 | DRG: 291 | Disposition: A | Discharge status: Skilled Nursing Facility | Payer: Medicare Other | Attending: Internal Medicine | Admitting: Internal Medicine

## 2016-11-07 ENCOUNTER — Encounter (HOSPITAL_COMMUNITY): Payer: Self-pay

## 2016-11-07 ENCOUNTER — Emergency Department (HOSPITAL_COMMUNITY): Payer: Medicare Other

## 2016-11-07 ENCOUNTER — Other Ambulatory Visit: Payer: Self-pay

## 2016-11-07 DIAGNOSIS — M069 Rheumatoid arthritis, unspecified: Secondary | ICD-10-CM | POA: Diagnosis present

## 2016-11-07 DIAGNOSIS — N183 Chronic kidney disease, stage 3 unspecified: Secondary | ICD-10-CM | POA: Diagnosis present

## 2016-11-07 DIAGNOSIS — D509 Iron deficiency anemia, unspecified: Secondary | ICD-10-CM

## 2016-11-07 DIAGNOSIS — Z888 Allergy status to other drugs, medicaments and biological substances status: Secondary | ICD-10-CM

## 2016-11-07 DIAGNOSIS — R0602 Shortness of breath: Secondary | ICD-10-CM | POA: Diagnosis present

## 2016-11-07 DIAGNOSIS — Z87891 Personal history of nicotine dependence: Secondary | ICD-10-CM

## 2016-11-07 DIAGNOSIS — R569 Unspecified convulsions: Secondary | ICD-10-CM | POA: Diagnosis present

## 2016-11-07 DIAGNOSIS — D638 Anemia in other chronic diseases classified elsewhere: Secondary | ICD-10-CM | POA: Diagnosis present

## 2016-11-07 DIAGNOSIS — I4891 Unspecified atrial fibrillation: Secondary | ICD-10-CM | POA: Diagnosis not present

## 2016-11-07 DIAGNOSIS — Z23 Encounter for immunization: Secondary | ICD-10-CM | POA: Diagnosis present

## 2016-11-07 DIAGNOSIS — I350 Nonrheumatic aortic (valve) stenosis: Secondary | ICD-10-CM

## 2016-11-07 DIAGNOSIS — J441 Chronic obstructive pulmonary disease with (acute) exacerbation: Secondary | ICD-10-CM

## 2016-11-07 DIAGNOSIS — C8330 Diffuse large B-cell lymphoma, unspecified site: Secondary | ICD-10-CM | POA: Diagnosis present

## 2016-11-07 DIAGNOSIS — K921 Melena: Secondary | ICD-10-CM

## 2016-11-07 DIAGNOSIS — K449 Diaphragmatic hernia without obstruction or gangrene: Secondary | ICD-10-CM | POA: Diagnosis present

## 2016-11-07 DIAGNOSIS — K7460 Unspecified cirrhosis of liver: Secondary | ICD-10-CM | POA: Diagnosis present

## 2016-11-07 DIAGNOSIS — Z7952 Long term (current) use of systemic steroids: Secondary | ICD-10-CM

## 2016-11-07 DIAGNOSIS — Z66 Do not resuscitate: Secondary | ICD-10-CM | POA: Diagnosis present

## 2016-11-07 DIAGNOSIS — I130 Hypertensive heart and chronic kidney disease with heart failure and stage 1 through stage 4 chronic kidney disease, or unspecified chronic kidney disease: Principal | ICD-10-CM | POA: Diagnosis present

## 2016-11-07 DIAGNOSIS — I959 Hypotension, unspecified: Secondary | ICD-10-CM | POA: Diagnosis not present

## 2016-11-07 DIAGNOSIS — I482 Chronic atrial fibrillation: Secondary | ICD-10-CM | POA: Diagnosis present

## 2016-11-07 DIAGNOSIS — Z86711 Personal history of pulmonary embolism: Secondary | ICD-10-CM | POA: Diagnosis not present

## 2016-11-07 DIAGNOSIS — A0472 Enterocolitis due to Clostridium difficile, not specified as recurrent: Secondary | ICD-10-CM | POA: Diagnosis not present

## 2016-11-07 DIAGNOSIS — R531 Weakness: Secondary | ICD-10-CM | POA: Diagnosis not present

## 2016-11-07 DIAGNOSIS — G40909 Epilepsy, unspecified, not intractable, without status epilepticus: Secondary | ICD-10-CM | POA: Diagnosis present

## 2016-11-07 DIAGNOSIS — D649 Anemia, unspecified: Secondary | ICD-10-CM

## 2016-11-07 DIAGNOSIS — I248 Other forms of acute ischemic heart disease: Secondary | ICD-10-CM | POA: Diagnosis present

## 2016-11-07 DIAGNOSIS — E782 Mixed hyperlipidemia: Secondary | ICD-10-CM | POA: Diagnosis present

## 2016-11-07 DIAGNOSIS — J90 Pleural effusion, not elsewhere classified: Secondary | ICD-10-CM

## 2016-11-07 DIAGNOSIS — I781 Nevus, non-neoplastic: Secondary | ICD-10-CM | POA: Diagnosis present

## 2016-11-07 DIAGNOSIS — Z95828 Presence of other vascular implants and grafts: Secondary | ICD-10-CM

## 2016-11-07 DIAGNOSIS — C8338 Diffuse large B-cell lymphoma, lymph nodes of multiple sites: Secondary | ICD-10-CM

## 2016-11-07 DIAGNOSIS — J9611 Chronic respiratory failure with hypoxia: Secondary | ICD-10-CM | POA: Diagnosis present

## 2016-11-07 DIAGNOSIS — K5521 Angiodysplasia of colon with hemorrhage: Secondary | ICD-10-CM | POA: Diagnosis present

## 2016-11-07 DIAGNOSIS — E538 Deficiency of other specified B group vitamins: Secondary | ICD-10-CM

## 2016-11-07 DIAGNOSIS — J810 Acute pulmonary edema: Secondary | ICD-10-CM | POA: Diagnosis not present

## 2016-11-07 DIAGNOSIS — G8929 Other chronic pain: Secondary | ICD-10-CM | POA: Diagnosis present

## 2016-11-07 DIAGNOSIS — E876 Hypokalemia: Secondary | ICD-10-CM | POA: Diagnosis not present

## 2016-11-07 DIAGNOSIS — C7951 Secondary malignant neoplasm of bone: Secondary | ICD-10-CM | POA: Diagnosis present

## 2016-11-07 DIAGNOSIS — I4510 Unspecified right bundle-branch block: Secondary | ICD-10-CM | POA: Diagnosis present

## 2016-11-07 DIAGNOSIS — M6281 Muscle weakness (generalized): Secondary | ICD-10-CM

## 2016-11-07 DIAGNOSIS — J449 Chronic obstructive pulmonary disease, unspecified: Secondary | ICD-10-CM | POA: Diagnosis present

## 2016-11-07 DIAGNOSIS — K922 Gastrointestinal hemorrhage, unspecified: Secondary | ICD-10-CM | POA: Diagnosis not present

## 2016-11-07 DIAGNOSIS — Z515 Encounter for palliative care: Secondary | ICD-10-CM | POA: Diagnosis present

## 2016-11-07 DIAGNOSIS — D6862 Lupus anticoagulant syndrome: Secondary | ICD-10-CM | POA: Diagnosis present

## 2016-11-07 DIAGNOSIS — D500 Iron deficiency anemia secondary to blood loss (chronic): Secondary | ICD-10-CM | POA: Diagnosis present

## 2016-11-07 DIAGNOSIS — Z79899 Other long term (current) drug therapy: Secondary | ICD-10-CM

## 2016-11-07 DIAGNOSIS — I509 Heart failure, unspecified: Secondary | ICD-10-CM | POA: Diagnosis not present

## 2016-11-07 DIAGNOSIS — Z7901 Long term (current) use of anticoagulants: Secondary | ICD-10-CM

## 2016-11-07 DIAGNOSIS — R410 Disorientation, unspecified: Secondary | ICD-10-CM | POA: Diagnosis present

## 2016-11-07 DIAGNOSIS — I480 Paroxysmal atrial fibrillation: Secondary | ICD-10-CM | POA: Diagnosis present

## 2016-11-07 DIAGNOSIS — N400 Enlarged prostate without lower urinary tract symptoms: Secondary | ICD-10-CM | POA: Diagnosis present

## 2016-11-07 DIAGNOSIS — I5033 Acute on chronic diastolic (congestive) heart failure: Secondary | ICD-10-CM | POA: Diagnosis present

## 2016-11-07 DIAGNOSIS — K219 Gastro-esophageal reflux disease without esophagitis: Secondary | ICD-10-CM | POA: Diagnosis present

## 2016-11-07 DIAGNOSIS — I10 Essential (primary) hypertension: Secondary | ICD-10-CM | POA: Diagnosis present

## 2016-11-07 DIAGNOSIS — N179 Acute kidney failure, unspecified: Secondary | ICD-10-CM | POA: Diagnosis not present

## 2016-11-07 DIAGNOSIS — M545 Low back pain: Secondary | ICD-10-CM | POA: Diagnosis present

## 2016-11-07 DIAGNOSIS — Z86010 Personal history of colonic polyps: Secondary | ICD-10-CM

## 2016-11-07 LAB — CBC WITH DIFFERENTIAL/PLATELET
Basophils Absolute: 0 10*3/uL (ref 0.0–0.1)
Basophils Relative: 0 %
EOS ABS: 0 10*3/uL (ref 0.0–0.7)
EOS PCT: 0 %
HCT: 18.8 % — ABNORMAL LOW (ref 39.0–52.0)
Hemoglobin: 5.1 g/dL — CL (ref 13.0–17.0)
LYMPHS ABS: 0.4 10*3/uL — AB (ref 0.7–4.0)
LYMPHS PCT: 5 %
MCH: 26.7 pg (ref 26.0–34.0)
MCHC: 27.1 g/dL — AB (ref 30.0–36.0)
MCV: 98.4 fL (ref 78.0–100.0)
MONO ABS: 0.6 10*3/uL (ref 0.1–1.0)
MONOS PCT: 8 %
Neutro Abs: 7 10*3/uL (ref 1.7–7.7)
Neutrophils Relative %: 86 %
PLATELETS: 123 10*3/uL — AB (ref 150–400)
RBC: 1.91 MIL/uL — AB (ref 4.22–5.81)
RDW: 16.1 % — AB (ref 11.5–15.5)
WBC: 8.1 10*3/uL (ref 4.0–10.5)

## 2016-11-07 LAB — HEMATOCRIT: HCT: 17.3 % — ABNORMAL LOW (ref 39.0–52.0)

## 2016-11-07 LAB — MAGNESIUM: Magnesium: 2.2 mg/dL (ref 1.7–2.4)

## 2016-11-07 LAB — BASIC METABOLIC PANEL
Anion gap: 4 — ABNORMAL LOW (ref 5–15)
BUN: 18 mg/dL (ref 6–20)
CALCIUM: 7.7 mg/dL — AB (ref 8.9–10.3)
CHLORIDE: 107 mmol/L (ref 101–111)
CO2: 30 mmol/L (ref 22–32)
Creatinine, Ser: 1.36 mg/dL — ABNORMAL HIGH (ref 0.61–1.24)
GFR, EST AFRICAN AMERICAN: 54 mL/min — AB (ref >60)
GFR, EST NON AFRICAN AMERICAN: 46 mL/min — AB (ref >60)
GLUCOSE: 116 mg/dL — AB (ref 65–99)
Potassium: 4.4 mmol/L (ref 3.5–5.1)
Sodium: 141 mmol/L (ref 135–145)

## 2016-11-07 LAB — TROPONIN I
Troponin I: 0.06 ng/mL (ref <0.03)
Troponin I: 0.06 ng/mL (ref <0.03)

## 2016-11-07 LAB — POC OCCULT BLOOD, ED: Fecal Occult Bld: POSITIVE — AB

## 2016-11-07 LAB — BRAIN NATRIURETIC PEPTIDE: B Natriuretic Peptide: 619 pg/mL — ABNORMAL HIGH (ref 0.0–100.0)

## 2016-11-07 LAB — HEMOGLOBIN: Hemoglobin: 4.9 g/dL — CL (ref 13.0–17.0)

## 2016-11-07 LAB — PREPARE RBC (CROSSMATCH)

## 2016-11-07 LAB — DIGOXIN LEVEL: Digoxin Level: <0.2 ng/mL — ABNORMAL LOW (ref 0.8–2.0)

## 2016-11-07 MED ORDER — DIGOXIN 0.25 MG/ML IJ SOLN
INTRAMUSCULAR | Status: AC
  Filled 2016-11-07: qty 2

## 2016-11-07 MED ORDER — METHYLPREDNISOLONE SODIUM SUCC 125 MG IJ SOLR
125.0000 mg | Freq: Once | INTRAMUSCULAR | Status: AC
  Administered 2016-11-07: 125 mg via INTRAVENOUS
  Filled 2016-11-07: qty 2

## 2016-11-07 MED ORDER — DIGOXIN 0.25 MG/ML IJ SOLN
0.5000 mg | Freq: Once | INTRAMUSCULAR | Status: AC
  Administered 2016-11-07: 0.5 mg via INTRAVENOUS

## 2016-11-07 MED ORDER — FAMOTIDINE IN NACL 20-0.9 MG/50ML-% IV SOLN
20.0000 mg | Freq: Two times a day (BID) | INTRAVENOUS | Status: DC
  Administered 2016-11-07 – 2016-11-11 (×9): 20 mg via INTRAVENOUS
  Filled 2016-11-07 (×8): qty 50

## 2016-11-07 MED ORDER — DIGOXIN 0.25 MG/ML IJ SOLN
0.2500 mg | Freq: Every day | INTRAMUSCULAR | Status: DC
Start: 2016-11-08 — End: 2016-11-11
  Administered 2016-11-08 – 2016-11-10 (×3): 0.25 mg via INTRAVENOUS
  Filled 2016-11-07 (×4): qty 2

## 2016-11-07 MED ORDER — IPRATROPIUM BROMIDE 0.02 % IN SOLN
0.5000 mg | Freq: Four times a day (QID) | RESPIRATORY_TRACT | Status: DC
  Administered 2016-11-07: 0.5 mg via RESPIRATORY_TRACT
  Filled 2016-11-07: qty 2.5

## 2016-11-07 MED ORDER — PNEUMOCOCCAL VAC POLYVALENT 25 MCG/0.5ML IJ INJ
0.5000 mL | INJECTION | INTRAMUSCULAR | Status: AC
  Administered 2016-11-08: 0.5 mL via INTRAMUSCULAR
  Filled 2016-11-07: qty 0.5

## 2016-11-07 MED ORDER — LEVALBUTEROL HCL 0.63 MG/3ML IN NEBU
0.6300 mg | INHALATION_SOLUTION | Freq: Four times a day (QID) | RESPIRATORY_TRACT | Status: DC
  Administered 2016-11-07: 0.63 mg via RESPIRATORY_TRACT
  Filled 2016-11-07: qty 3

## 2016-11-07 MED ORDER — SODIUM CHLORIDE 0.9% FLUSH
3.0000 mL | Freq: Two times a day (BID) | INTRAVENOUS | Status: DC
  Administered 2016-11-07 – 2016-11-14 (×10): 3 mL via INTRAVENOUS

## 2016-11-07 MED ORDER — LEVALBUTEROL HCL 0.63 MG/3ML IN NEBU
0.6300 mg | INHALATION_SOLUTION | Freq: Four times a day (QID) | RESPIRATORY_TRACT | Status: DC | PRN
  Administered 2016-11-08 (×2): 0.63 mg via RESPIRATORY_TRACT
  Filled 2016-11-07 (×2): qty 3

## 2016-11-07 MED ORDER — DILTIAZEM LOAD VIA INFUSION
10.0000 mg | Freq: Once | INTRAVENOUS | Status: AC
  Administered 2016-11-07: 10 mg via INTRAVENOUS
  Filled 2016-11-07: qty 10

## 2016-11-07 MED ORDER — FAMOTIDINE IN NACL 20-0.9 MG/50ML-% IV SOLN
INTRAVENOUS | Status: AC
  Administered 2016-11-07: 20 mg via INTRAVENOUS
  Filled 2016-11-07: qty 50

## 2016-11-07 MED ORDER — MAGNESIUM SULFATE 2 GM/50ML IV SOLN
2.0000 g | Freq: Once | INTRAVENOUS | Status: AC
  Administered 2016-11-07: 2 g via INTRAVENOUS

## 2016-11-07 MED ORDER — IPRATROPIUM BROMIDE 0.02 % IN SOLN
0.5000 mg | Freq: Three times a day (TID) | RESPIRATORY_TRACT | Status: DC
  Administered 2016-11-08 (×2): 0.5 mg via RESPIRATORY_TRACT
  Filled 2016-11-07 (×4): qty 2.5

## 2016-11-07 MED ORDER — SODIUM CHLORIDE 0.9 % IV SOLN
10.0000 mL/h | Freq: Once | INTRAVENOUS | Status: AC
  Administered 2016-11-07: 10 mL/h via INTRAVENOUS

## 2016-11-07 MED ORDER — DILTIAZEM HCL 100 MG IV SOLR
INTRAVENOUS | Status: AC
  Filled 2016-11-07: qty 100

## 2016-11-07 MED ORDER — HYDROMORPHONE HCL 1 MG/ML IJ SOLN
1.0000 mg | INTRAMUSCULAR | Status: DC | PRN
  Administered 2016-11-08 (×4): 1 mg via INTRAVENOUS
  Filled 2016-11-07 (×4): qty 1

## 2016-11-07 MED ORDER — DILTIAZEM HCL 100 MG IV SOLR
5.0000 mg/h | INTRAVENOUS | Status: DC
  Administered 2016-11-07 (×2): 5 mg/h via INTRAVENOUS
  Administered 2016-11-08: 12.5 mg/h via INTRAVENOUS
  Administered 2016-11-08: 15 mg/h via INTRAVENOUS
  Filled 2016-11-07 (×4): qty 100

## 2016-11-07 MED ORDER — MAGNESIUM SULFATE 2 GM/50ML IV SOLN
INTRAVENOUS | Status: AC
  Filled 2016-11-07: qty 50

## 2016-11-07 NOTE — Progress Notes (Signed)
Pt. Hgb was 5.1 at 1440 on 11/07/16. Orders to transfuse blood. Started blood at 2125. Few minutes before blood was started Lab drew another CBC. The Hgb level came back at 4.9. This reading is really close to the last level, blood is transfusing now.  Ericka Pontiff, RN  11/07/16

## 2016-11-07 NOTE — ED Notes (Signed)
CRITICAL VALUE ALERT  Critical value received:  Hgb 5.1 Hct 18.8  Date of notification:  11/07/16  Time of notification:  [id number]  Critical value read back:Yes.    Nurse who received alert:  Parthenia Ames  MD notified (1st page):  Dr. Stark Jock  Time of first page:  1517  MD notified (2nd page):  Time of second page:  Responding MD:  Dr. Stark Jock  Time MD responded:  [id number]

## 2016-11-07 NOTE — ED Notes (Signed)
MD notified of BP 102/68, at this time.

## 2016-11-07 NOTE — H&P (Signed)
History and Physical    [name] [id number] DOB: October 15, 1931 DOA: 11/07/2016  PCP: [name], MD    Chief Complaint: SOB and back pain.  HPI: [name] is a 82 y.o. male with medical history significant of chronic iron deficiency anemia, telangiectasia of antrum and jejunum,  paroxysmal atrial fibrillation on Xarelto, chronic diastolic heart failure, [id number] deficiency, COPD, diffuse large B cell lymphoma, HX of disseminated herpes zoster, essential hypertension, hyperlipidemia, GERD, lupus anticoagulant, rheumatoid arthritis, pulmonary embolism in 2000 03/04/2010, seizure disorder, stage III chronic kidney disease who comes to the emergency department with complaints of progressively worse fatigue, dyspnea and back pain. He has had several episodes of melena recently. He denies abdominal pain, nausea or emesis. He denies CP, but complains of palpitations, mild dysnea and postural dizziness.  ED Course: The patient was noticed to be in atrial fibrillation with RVR in the 120s-130s. He was started on a Cardizem infusion. His workup showed atrial fibrillation with RVR on EKG, RBBB, a hemoglobin level of 5.1, previously was 8.5 g/dL several weeks ago, fecal occult blood was positive. Patient's troponin was 0.06 ng/mL, BNP 617 pg/mL, glucose 116 and creatinine 1.35 mg/dL. All other labs were negative or unremarkable.  Imaging: Chest radiograph showed interstitial pulmonary edema with a moderate right pleural effusion and basilar space disease which could be atelectasis or pneumonia.  Review of Systems: As per HPI otherwise 10 point review of systems negative.    Past Medical History:  Diagnosis Date  . Anemia   . Aortic stenosis, moderate 05/21/2015  . Asthma   . B12 deficiency 07/28/2016  . Cellulitis of leg, left 01/03/2015  . Cervical spondylosis without myelopathy   . Cholelithiasis   . Chronic atrial fibrillation ([place])   . Chronic diastolic heart failure ([place])   .  Chronic lower back pain   . Cirrhosis ([place])    Questionable, AFP normal Feb 01/2012, hx of ETOH use   . COPD (chronic obstructive pulmonary disease) (HCC)    Uses occasional nighttime O2  . Diffuse large B cell lymphoma ([place]) 08/22/2015  . Disseminated herpes zoster 2010  . DNR (do not resuscitate) 11/01/2015  . Dysrhythmia    chronic AFib  . Essential hypertension, benign   . GERD (gastroesophageal reflux disease)   . Headache(784.0)   . History of chicken pox 1941; 2011  . Iron deficiency anemia 01/18/2012  . Lupus anticoagulant positive   . Mixed hyperlipidemia   . Pulmonary embolus ([place])    2003 and 2011  . Pulmonary nodules    Chest CT 09/11  . Rectal ulcer 12/01/2015  . Rheumatoid arthritis(714.0)   . Seizure disorder ([place]) 09/2015  . Stage III chronic kidney disease   . Telangiectasia 11/29/2015   ANTRUM AND JEJUNUM    Past Surgical History:  Procedure Laterality Date  . BACK SURGERY    . CATARACT EXTRACTION W/ INTRAOCULAR LENS  IMPLANT, BILATERAL    . CHOLECYSTECTOMY  05/23/2012   Procedure: LAPAROSCOPIC CHOLECYSTECTOMY WITH INTRAOPERATIVE CHOLANGIOGRAM;  Surgeon: Stark Klein, MD;  Location: [facility];  Service: General;  Laterality: N/A;  . COLONOSCOPY  01/24/2013   [id number] polyps/colonic diverticulosis  . COLONOSCOPY N/A 12/01/2015   Procedure: COLONOSCOPY;  Surgeon: Danie Binder, MD;  [facility];  Service: Endoscopy;  Laterality: N/A;  . ESOPHAGOGASTRODUODENOSCOPY  02/02/12   [id number] size hiatal hernia; otherwise normal exam  . ESOPHAGOGASTRODUODENOSCOPY N/A 11/29/2015   [id number] diverticulosis/small internal hemorrhoids/left colon is redundant  . LYMPH NODE BIOPSY    .  LYMPH NODE BIOPSY Right 09/11/2015   Procedure: RIGHT INGUINAL LYMPH NODE BIOPSY;  Surgeon: Aviva Signs, MD;  Location: [facility];  Service: General;  Laterality: Right;  . MYRINGOTOMY     "3 times; both ears"  . POLYPECTOMY  02/02/2012   RMR: Multiple colonic polyps removed, flat,  tubular adenomas/ Left-sided diverticulosis  . PORTACATH PLACEMENT    . PORTACATH PLACEMENT Left 09/11/2015   Procedure: INSERTION PORT-A-CATH;  Surgeon: Aviva Signs, MD;  Location: [facility];  Service: General;  Laterality: Left;  . POSTERIOR LUMBAR VERTEBRAE EXCISION  2003; 2009; 2011     reports that he quit smoking about 18 years ago. His smoking use included Cigarettes. He has a 57.00 pack-year smoking history. He has never used smokeless tobacco. He reports that he does not drink alcohol or use drugs.  Allergies  Allergen Reactions  . Cymbalta [Duloxetine Hcl] Other (See Comments)    Confusion   . Procaine Hcl Hives and Other (See Comments)    NOVOCAINE: Sweating, Confusion, Not in right state of mind.  [name]    unknown unknown    Family History  Problem Relation Age of Onset  . Diabetes Mother   . Colon cancer Neg Hx   . Liver disease Neg Hx    Prior to Admission medications   Medication Sig Start Date End Date Taking? Authorizing Provider  albuterol (PROAIR HFA) 108 (90 BASE) MCG/ACT inhaler Inhale 2 puffs into the lungs every 6 (six) hours as needed. Shortness of Breath 08/25/15  Yes Lezlie Octave Black, NP  allopurinol (ZYLOPRIM) 300 MG tablet Take 300 mg by mouth daily. 08/06/16  Yes Historical Provider, MD  cetirizine (ZYRTEC) 10 MG tablet Take 10 mg by mouth daily.     Yes Historical Provider, MD  DIGOX 125 MCG tablet TAKE ONE TABLET BY MOUTH DAILY 07/06/16  Yes Satira Sark, MD  diltiazem (CARDIZEM) 120 MG tablet Take 120 mg by mouth daily. 09/07/16  Yes Historical Provider, MD  diphenhydrAMINE (BENADRYL) 25 MG tablet Take 25 mg by mouth every 6 (six) hours as needed for itching or allergies.   Yes Historical Provider, MD  ferrous sulfate 325 (65 FE) MG tablet Take 325 mg by mouth daily.   Yes Historical Provider, MD  fluticasone (FLONASE) 50 MCG/ACT nasal spray Place 2 sprays into both nostrils daily.  08/25/15  Yes Historical  Provider, MD  folic acid (FOLVITE) 1 MG tablet Take 1 tablet (1 mg total) by mouth daily. 09/01/16  Yes Manon Hilding Kefalas, PA-C  furosemide (LASIX) 40 MG tablet Take 1 tablet (40 mg total) by mouth daily as needed for fluid (For fluid buildup in your lungs and legs.). Take daily for prevention of fluid buildup. 12/03/15  Yes Rexene Alberts, MD  gabapentin (NEURONTIN) 300 MG capsule Take 1 capsule (300 mg total) by mouth 2 (two) times daily. 12/03/15  Yes Rexene Alberts, MD  ipratropium-albuterol (DUONEB) 0.5-2.5 (3) MG/3ML SOLN Take 3 mLs by nebulization every 6 (six) hours as needed. Patient taking differently: Take 3 mLs by nebulization every 6 (six) hours as needed (shortness of breath).  09/18/15  Yes Kathie Dike, MD  levETIRAcetam (KEPPRA) 500 MG tablet Take 1 tablet (500 mg total) by mouth 2 (two) times daily. 10/21/15  Yes Samuella Cota, MD  methocarbamol (ROBAXIN) 500 MG tablet Take 1 tablet (500 mg total) by mouth every 8 (eight) hours as needed for muscle spasms. 12/03/15  Yes Rexene Alberts, MD  Omega-3 Fatty  Acids (FISH OIL) 1000 MG CAPS Take 2 capsules by mouth 2 (two) times daily.    Yes Historical Provider, MD  omeprazole (PRILOSEC) 40 MG capsule Take 1 capsule (40 mg total) by mouth 2 (two) times daily. 12/03/15  Yes Rexene Alberts, MD  Oxycodone HCl 10 MG TABS Take 1 tablet (10 mg total) by mouth every 6 (six) hours as needed. 09/23/16  Yes Patrici Ranks, MD  potassium chloride SA (K-DUR,KLOR-CON) 20 MEQ tablet Take 0.5 tablets (10 mEq total) by mouth daily as needed (Take as needed when you take Lasix (furosemide).). Starting 08/27/15. 12/03/15  Yes Rexene Alberts, MD  predniSONE (DELTASONE) 10 MG tablet Take 10 mg by mouth daily with breakfast.   Yes Historical Provider, MD  rivaroxaban (XARELTO) 20 MG TABS tablet Take 20 mg by mouth daily with supper.   Yes Historical Provider, MD  tamsulosin (FLOMAX) 0.4 MG CAPS capsule Take 1 capsule (0.4 mg total) by mouth at bedtime. For prostate  treatment. 05/25/15  Yes Rexene Alberts, MD  lidocaine-prilocaine (EMLA) cream Apply a quarter size amount to port site 1 hour prior to chemo. Do not rub in. Cover with plastic wrap. 09/17/15   Patrici Ranks, MD  tadalafil (CIALIS) 5 MG tablet Take 5 mg by mouth daily as needed for erectile dysfunction.  05/18/15   Historical Provider, MD  Vitamin D, Ergocalciferol, (DRISDOL) 50000 UNITS CAPS capsule Take 1 capsule by mouth once a week. Reported on 07/05/2016 04/27/15   Historical Provider, MD    Physical Exam:  Constitutional: Looks acutely ill. Vitals:   11/07/16 1600 11/07/16 1620 11/07/16 1640 11/07/16 1700  BP: 102/68 112/64 131/78 (!) 105/53  Pulse: (!) 125 (!) 164 114 112  Resp: 24 22 24 20   Temp:      TempSrc:      SpO2: 99% 99% 97% 97%  Height:       Eyes: PERRL, lids and conjunctivae are pale. ENMT: Mucous membranes are mildly dry. Posterior pharynx clear of any exudate or lesions. Absent dentition.  Neck: normal, supple, no JVD, no masses, no thyromegaly Respiratory: Bibasilar crackles. Normal respiratory effort. No accessory muscle use.  Cardiovascular: Irregularly irregular, tahycardic at 110-120s bpm, positive  SEM, no rubs / gallops. 2+ extremity edema. 2+ pedal pulses. No carotid bruits.  Abdomen: Soft, no tenderness, no masses palpated. No hepatosplenomegaly. Bowel sounds positive.  Musculoskeletal: no clubbing / cyanosis. Good ROM, no contractures. Normal muscle tone.  Skin: no rashes, lesions, ulcers. No induration Neurologic: CN 2-12 grossly intact. Sensation intact, DTR normal. Strength 5/5 in all 4.  Psychiatric: Normal judgment and insight. Alert and oriented x 4. Normal mood.     Labs on Admission: I have personally reviewed following labs and imaging studies  CBC:  Recent Labs Lab 11/07/16 1440  WBC 8.1  NEUTROABS 7.0  HGB 5.1*  HCT 18.8*  MCV 98.4  PLT [id number]   Basic Metabolic Panel:  Recent Labs Lab 11/07/16 1440  NA 141  K 4.4  CL 107  CO2  30  GLUCOSE 116*  BUN 18  CREATININE 1.36*  CALCIUM 7.7*  MG 2.2   GFR: CrCl cannot be calculated (Unknown ideal weight.). Liver Function Tests: No results for input(s): AST, ALT, ALKPHOS, BILITOT, PROT, ALBUMIN in the last 168 hours. No results for input(s): LIPASE, AMYLASE in the last 168 hours. No results for input(s): AMMONIA in the last 168 hours. Coagulation Profile: No results for input(s): INR, PROTIME in the last 168 hours. Cardiac Enzymes:  Recent Labs Lab 11/07/16 1440  TROPONINI 0.06*   BNP (last 3 results) No results for input(s): PROBNP in the last 8760 hours. HbA1C: No results for input(s): HGBA1C in the last 72 hours. CBG: No results for input(s): GLUCAP in the last 168 hours. Lipid Profile: No results for input(s): CHOL, HDL, LDLCALC, TRIG, CHOLHDL, LDLDIRECT in the last 72 hours. Thyroid Function Tests: No results for input(s): TSH, T4TOTAL, FREET4, T3FREE, THYROIDAB in the last 72 hours. Anemia Panel: No results for input(s): VITAMINB12, FOLATE, FERRITIN, TIBC, IRON, RETICCTPCT in the last 72 hours. Urine analysis:    Component Value Date/Time   COLORURINE YELLOW 11/29/2015 0551   APPEARANCEUR CLEAR 11/29/2015 0551   LABSPEC 1.010 11/29/2015 0551   PHURINE 5.5 11/29/2015 0551   GLUCOSEU NEGATIVE 11/29/2015 0551   HGBUR NEGATIVE 11/29/2015 0551   BILIRUBINUR NEGATIVE 11/29/2015 0551   KETONESUR NEGATIVE 11/29/2015 0551   PROTEINUR NEGATIVE 11/29/2015 0551   UROBILINOGEN 0.2 10/18/2015 1225   NITRITE NEGATIVE 11/29/2015 0551   LEUKOCYTESUR NEGATIVE 11/29/2015 0551    Radiological Exams on Admission: Dg Chest Port 1 View  Result Date: 11/07/2016 CLINICAL DATA:  Worsening cough and shortness of breath. EXAM: PORTABLE CHEST 1 VIEW COMPARISON:  Single-view of the chest 05/28/2016 and 11/28/2015. FINDINGS: There is interstitial pulmonary edema. Moderate right pleural effusion with associated basilar airspace disease is noted. Port-A-Cath is in place.  Heart size is upper normal. No pneumothorax. IMPRESSION: Interstitial pulmonary edema with a moderate right pleural effusion and basilar airspace disease which could be atelectasis or pneumonia. Electronically Signed   By: Inge Rise M.D.   On: 11/07/2016 15:08   05-20-2015 Echocardiogram. ------------------------------------------------------------------- LV EF: 65% -   70%  ------------------------------------------------------------------- Indications:      CHF - 428.0.  ------------------------------------------------------------------- Study Conclusions  - Left ventricle: The cavity size was normal. Wall thickness was   increased in a pattern of mild LVH. Systolic function was   vigorous. The estimated ejection fraction was in the range of 65%   to 70%. Wall motion was normal; there were no regional wall   motion abnormalities. The study is not technically sufficient to   allow evaluation of LV diastolic function. - Aortic valve: There was moderate to severe stenosis. Mean   gradient (S): 23 mm Hg. Peak gradient (S): 54 mm Hg. VTI ratio of   LVOT to aortic valve: 0.34. Valve area (VTI): 1.07 cm^2. Valve   area (Vmax): 1.08 cm^2. Valve area (Vmean): 1.21 cm^2. - Mitral valve: Calcified annulus. Mildly calcified leaflets . Mean   gradient (D): 4 mm Hg. Valve area by pressure half-time: 2.24   cm^2. Valve area by continuity equation (using LVOT flow): 1.67   cm^2. - Left atrium: The atrium was moderately dilated. - Right atrium: Central venous pressure (est): 3 mm Hg. - Atrial septum: There was increased thickness of the septum,   consistent with lipomatous hypertrophy. No defect or patent   foramen ovale was identified. - Tricuspid valve: There was trivial regurgitation. - Pulmonary arteries: Systolic pressure could not be accurately   estimated. - Pericardium, extracardiac: There was no pericardial effusion.  Impressions:  - Mild LVH with LVEF 65-70%,  indeterminate diastolic function.   Moderate left atrial enlargement. Moderate to severe calcific   aortic stenosis as outlined above - there has been progression   compared to the prior study. Trivial tricuspid regurgitation,   unable to assess PASP.   EKG: Independently reviewed. Vent. rate 134 BPM PR interval * ms QRS duration  119 ms QT/QTc 338/505 ms P-R-T axes * -85 36 Atrial fibrillation with rapid V-rate RBBB and LAFB  Assessment/Plan Principal Problem:   Atrial fibrillation with rapid ventricular response (HCC) CHA2DS2-VASc Score of at least 5. Likely as a result of anemia induced tachycardia/CHF and albuterol Neb given by EMS. Admit to ICU/inpatient. Continue supplemental oxygen. Continue Cardizem infusion as blood pressure allows. Digoxin 0.5 mg IVP 1. Hold Xarelto for now. Continue packed RBC transfusion.  Active Problems:   Acute on chronic diastolic CHF (congestive heart failure) ([place]) Continue blood transfusion. Due to hypotension the Cardizem infusion was recently stopped. Hold antihypertensives and diuretics for now. Strict intake and output. Daily weights. Follow-up BUN, creatinine and electrolytes closely. Echocardiogram in the morning.    Aortic stenosis, moderate Check echocardiogram in the morning.    Telangiectasia   Symptomatic anemia Monitor hematocrit and hemoglobin. Transfuse as needed. Consider GI evaluation in a.m.    COPD (chronic obstructive pulmonary disease) (HCC) Continue supplemental oxygen. Switch albuterol to Xopenex nebulizer treatment.    Stage III chronic kidney disease Monitor BUN, creatinine and electrolytes.       BPH (benign prostatic hyperplasia) Resume Flomax in a.m. if no endoscopic procedures      DVT prophylaxis: On Xarelto. Code Status: Full code. Family Communication: His older and younger daughters were present in the room. Disposition Plan: Admit to ICU for blood transfusion and rate  control. Consults called:  Admission status: Inpatient/ICU.   Reubin Milan MD [facility] Pager [phone].  If 7PM-7AM, please contact night-coverage [email or web address] Password [email or web address]  11/07/2016, 5:18 PM

## 2016-11-07 NOTE — ED Triage Notes (Signed)
Complain of SOB and chronic low back pain. Pt was given a duo neb by EMS prior to arrival

## 2016-11-07 NOTE — ED Notes (Signed)
cardizem increased to 10 mg/hr.

## 2016-11-07 NOTE — ED Notes (Signed)
Pt reports dark, black stools for several weeks. States he gets iron infusions and has had several blood transfusions in the past. Family states he had a previous "bleed", states "it stopped and they couldn't figure it out, that's when they switched his blood thinner".

## 2016-11-07 NOTE — ED Notes (Signed)
CRITICAL VALUE ALERT  Critical value received:  Troponin 0.06  Date of notification:  11/07/2016  Time of notification:  [id number]  Critical value read back:Yes.    Nurse who received alert:  Parthenia Ames  MD notified (1st page):  Dr. Stark Jock  Time of first page:  62  MD notified (2nd page):  Time of second page:  Responding MD:  Dr. Stark Jock  Time MD responded:  [phone]

## 2016-11-07 NOTE — ED Provider Notes (Addendum)
[facility] DEPT Provider Note   CSN: [id number] Arrival date & time: 11/07/16  1410     History   Chief Complaint Chief Complaint  Patient presents with  . Shortness of Breath    HPI [name] is a 82 y.o. male.  Patient is an 82 year old male with extensive PMH including COPD, CHF, paroxysmal atrial fibrillation. He presents for evaluation of shortness of breath, weakness in the past several days. He denies any fevers or chills. He does have occasional productive cough. He also reports increased swelling of both lower extremities. His family has tried giving him additional doses of Lasix, however this has not helped much.   The history is provided by the patient.  Shortness of Breath  This is a new problem. The average episode lasts 2 days. The problem occurs continuously.The problem has been gradually worsening. Associated symptoms include cough and sputum production. Pertinent negatives include no fever and no chest pain. He has tried nothing for the symptoms. Associated medical issues include COPD and heart failure.    Past Medical History:  Diagnosis Date  . Anemia   . Aortic stenosis, moderate 05/21/2015  . Asthma   . B12 deficiency 07/28/2016  . Cellulitis of leg, left 01/03/2015  . Cervical spondylosis without myelopathy   . Cholelithiasis   . Chronic atrial fibrillation ([place])   . Chronic diastolic heart failure ([place])   . Chronic lower back pain   . Cirrhosis ([place])    Questionable, AFP normal Feb 01/2012, hx of ETOH use   . COPD (chronic obstructive pulmonary disease) (HCC)    Uses occasional nighttime O2  . Diffuse large B cell lymphoma ([place]) 08/22/2015  . Disseminated herpes zoster 2010  . DNR (do not resuscitate) 11/01/2015  . Dysrhythmia    chronic AFib  . Essential hypertension, benign   . GERD (gastroesophageal reflux disease)   . Headache(784.0)   . History of chicken pox 1941; 2011  . Iron deficiency anemia 01/18/2012  . Lupus anticoagulant  positive   . Mixed hyperlipidemia   . Pulmonary embolus ([place])    2003 and 2011  . Pulmonary nodules    Chest CT 09/11  . Rectal ulcer 12/01/2015  . Rheumatoid arthritis(714.0)   . Seizure disorder ([place]) 09/2015  . Stage III chronic kidney disease   . Telangiectasia 11/29/2015   ANTRUM AND JEJUNUM    Patient Active Problem List   Diagnosis Date Noted  . Folate deficiency 09/01/2016  . B12 deficiency 07/28/2016  . Rectal ulcer 12/02/2015  . Telangiectasia 12/02/2015  . Syncope and collapse 11/28/2015  . AKI (acute kidney injury) ([place]) 11/28/2015  . Dehydration 11/28/2015  . History of pulmonary embolism 11/28/2015  . Elevated troponin I level 11/28/2015  . Normocytic anemia 11/28/2015  . GI bleed 11/28/2015  . Epigastric abdominal pain 11/28/2015  . DNR (do not resuscitate) 11/01/2015  . Back pain   . Status post thoracentesis   . Altered mental status   . Altered mental state 10/18/2015  . Acute encephalopathy 10/18/2015  . Chronic respiratory failure with hypoxia ([place]) 10/18/2015  . Leukocytosis 10/18/2015  . Seroma 10/18/2015  . Cellulitis of groin 10/18/2015  . Malignant pleural effusion   . Chronic respiratory failure ([place]) 09/17/2015  . Recurrent right pleural effusion   . Chronic diastolic CHF (congestive heart failure) ([place])   . HCAP (healthcare-associated pneumonia) 09/13/2015  . COPD with acute exacerbation ([place]) 09/13/2015  . Atrial fibrillation with RVR ([place])   . Acute respiratory  distress 08/22/2015  . 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 Chronic a-fib ([place]) 08/22/2015  . BPH (benign prostatic hyperplasia) 08/22/2015  . Acute diastolic CHF (congestive heart failure) ([place])   . Aortic stenosis, moderate 05/21/2015  . Acute on chronic diastolic CHF (congestive heart failure) ([place]) 05/19/2015  . COPD exacerbation ([place]) 05/19/2015  . Acute kidney injury ([place]) 01/05/2015  . Thrombocytopenia ([place]) 01/05/2015  . Stage III chronic kidney disease  01/05/2015  . Cellulitis of leg, left 01/03/2015  . Stiffness of joints, not elsewhere classified, multiple sites 01/28/2014  . Posture imbalance 01/28/2014  . Headache(784.0) 10/21/2013  . Cervical spondylosis without myelopathy 10/21/2013  . Chronic diastolic heart failure ([place]) 07/16/2013  . Adenomatous polyps 01/11/2013  . Anemia 10/13/2012  . COPD (chronic obstructive pulmonary disease) ([place]) 05/21/2012  . Long term current use of anticoagulant 05/21/2012  . Arthritis 05/21/2012  . Iron deficiency anemia 01/18/2012  . Lupus anticoagulant positive 01/18/2012  . GERD (gastroesophageal reflux disease) 01/18/2012  . Cirrhosis ([place]) 01/18/2012    Class: Question of  . Essential hypertension, benign 11/02/2010  . PULMONARY EMBOLISM 11/02/2010  . Chronic atrial fibrillation ([place]) 11/02/2010  . Rheumatoid arthritis ([place]) 11/02/2010    Past Surgical History:  Procedure Laterality Date  . BACK SURGERY    . CATARACT EXTRACTION W/ INTRAOCULAR LENS  IMPLANT, BILATERAL    . CHOLECYSTECTOMY  05/23/2012   Procedure: LAPAROSCOPIC CHOLECYSTECTOMY WITH INTRAOPERATIVE CHOLANGIOGRAM;  Surgeon: Stark Klein, MD;  Location: [facility];  Service: General;  Laterality: N/A;  . COLONOSCOPY  01/24/2013   [id number] polyps/colonic diverticulosis  . COLONOSCOPY N/A 12/01/2015   Procedure: COLONOSCOPY;  Surgeon: Danie Binder, MD;  Location: [facility];  Service: Endoscopy;  Laterality: N/A;  . ESOPHAGOGASTRODUODENOSCOPY  02/02/12   [id number] size hiatal hernia; otherwise normal exam  . ESOPHAGOGASTRODUODENOSCOPY N/A 11/29/2015   [id number] diverticulosis/small internal hemorrhoids/left colon is redundant  . LYMPH NODE BIOPSY    . LYMPH NODE BIOPSY Right 09/11/2015   Procedure: RIGHT INGUINAL LYMPH NODE BIOPSY;  Surgeon: Aviva Signs, MD;  Location: [facility];  Service: General;  Laterality: Right;  . MYRINGOTOMY     "3 times; both ears"  . POLYPECTOMY  02/02/2012   RMR: Multiple colonic polyps removed,  flat, tubular adenomas/ Left-sided diverticulosis  . PORTACATH PLACEMENT    . PORTACATH PLACEMENT Left 09/11/2015   Procedure: INSERTION PORT-A-CATH;  Surgeon: Aviva Signs, MD;  Location: [facility];  Service: General;  Laterality: Left;  . POSTERIOR LUMBAR VERTEBRAE EXCISION  2003; 2009; 2011       Home Medications    Prior to Admission medications   Medication Sig Start Date End Date Taking? Authorizing Provider  albuterol (PROAIR HFA) 108 (90 BASE) MCG/ACT inhaler Inhale 2 puffs into the lungs every 6 (six) hours as needed. Shortness of Breath 08/25/15   Radene Gunning, NP  allopurinol (ZYLOPRIM) 300 MG tablet Take 300 mg by mouth daily. 08/06/16   Historical Provider, MD  allopurinol (ZYLOPRIM) 300 MG tablet TAKE ONE TABLET BY MOUTH DAILY 09/07/16   Historical Provider, MD  cetirizine (ZYRTEC) 10 MG tablet Take 10 mg by mouth daily.      Historical Provider, MD  [place] 125 MCG tablet TAKE ONE TABLET BY MOUTH DAILY 07/06/16   Satira Sark, MD  diltiazem (CARDIZEM CD) 120 MG 24 hr capsule Take 1 capsule (120 mg total) by mouth daily. 12/03/15   Rexene Alberts, MD  diltiazem (CARDIZEM) 120 MG tablet Take 120 mg by mouth  daily. 09/07/16   Historical Provider, MD  diphenhydrAMINE (BENADRYL) 25 MG tablet Take 25 mg by mouth every 6 (six) hours as needed for itching or allergies.    Historical Provider, MD  ferrous sulfate 325 (65 FE) MG tablet Take 325 mg by mouth daily.    Historical Provider, MD  fluticasone (FLONASE) 50 MCG/ACT nasal spray Place 2 sprays into both nostrils daily.  08/25/15   Historical Provider, MD  folic acid (FOLVITE) 1 MG tablet Take 1 tablet (1 mg total) by mouth daily. 09/01/16   Baird Cancer, PA-C  furosemide (LASIX) 40 MG tablet Take 1 tablet (40 mg total) by mouth daily as needed for fluid (For fluid buildup in your lungs and legs.). Take daily for prevention of fluid buildup. 12/03/15   Rexene Alberts, MD  gabapentin (NEURONTIN) 300 MG capsule Take 1 capsule (300 mg total)  by mouth 2 (two) times daily. 12/03/15   Rexene Alberts, MD  ipratropium-albuterol (DUONEB) 0.5-2.5 (3) MG/3ML SOLN Take 3 mLs by nebulization every 6 (six) hours as needed. Patient taking differently: Take 3 mLs by nebulization every 6 (six) hours as needed (shortness of breath).  09/18/15   Kathie Dike, MD  ketoconazole (NIZORAL) 2 % shampoo Apply 1 application topically 3 (three) times a week.  05/21/13   Historical Provider, MD  leflunomide (ARAVA) 20 MG tablet Take 20 mg by mouth daily. 5 tabs Day 1,8 and 15 ( Fridays) then all other days 1 tab daily    Historical Provider, MD  levETIRAcetam (KEPPRA) 500 MG tablet Take 1 tablet (500 mg total) by mouth 2 (two) times daily. 10/21/15   Samuella Cota, MD  lidocaine-prilocaine (EMLA) cream Apply a quarter size amount to port site 1 hour prior to chemo. Do not rub in. Cover with plastic wrap. 09/17/15   Patrici Ranks, MD  methocarbamol (ROBAXIN) 500 MG tablet Take 1 tablet (500 mg total) by mouth every 8 (eight) hours as needed for muscle spasms. 12/03/15   Rexene Alberts, MD  methocarbamol (ROBAXIN) 500 MG tablet TAKE ONE TABLET BY MOUTH THREE TIMES DAILY AS NEEDED 09/08/16   Historical Provider, MD  Omega-3 Fatty Acids (FISH OIL) 1000 MG CAPS Take 2 capsules by mouth 2 (two) times daily.     Historical Provider, MD  omeprazole (PRILOSEC) 40 MG capsule Take 1 capsule (40 mg total) by mouth 2 (two) times daily. 12/03/15   Rexene Alberts, MD  Oxycodone HCl 10 MG TABS Take 1 tablet (10 mg total) by mouth every 6 (six) hours as needed. 09/23/16   Patrici Ranks, MD  potassium chloride SA (K-DUR,KLOR-CON) 20 MEQ tablet Take 0.5 tablets (10 mEq total) by mouth daily as needed (Take as needed when you take Lasix (furosemide).). Starting 08/27/15. 12/03/15   Rexene Alberts, MD  predniSONE (DELTASONE) 10 MG tablet Take 10 mg by mouth daily with breakfast.    Historical Provider, MD  rivaroxaban (XARELTO) 20 MG TABS tablet Take 20 mg by mouth daily with supper.     Historical Provider, MD  Sennosides-Docusate Sodium (SENOKOT S PO) Take 1 tablet by mouth daily.     Historical Provider, MD  tadalafil (CIALIS) 5 MG tablet Take 5 mg by mouth daily as needed for erectile dysfunction.  05/18/15   Historical Provider, MD  tamsulosin (FLOMAX) 0.4 MG CAPS capsule Take 1 capsule (0.4 mg total) by mouth at bedtime. For prostate treatment. 05/25/15   Rexene Alberts, MD  Vitamin D, Ergocalciferol, (DRISDOL) 50000 UNITS CAPS capsule Take  1 capsule by mouth once a week. Reported on 07/05/2016 04/27/15   Historical Provider, MD    Family History Family History  Problem Relation Age of Onset  . Diabetes Mother   . Colon cancer Neg Hx   . Liver disease Neg Hx     Social History Social History  Substance Use Topics  . Smoking status: Former Smoker    Packs/day: 1.00    Years: 57.00    Types: Cigarettes    Quit date: 01/27/1998  . Smokeless tobacco: Never Used  . Alcohol use No     Comment: "quit drinking 1976"     Allergies   Cymbalta [duloxetine hcl]; Procaine hcl; and Bystolic [nebivolol hcl]   Review of Systems Review of Systems  Constitutional: Negative for fever.  Respiratory: Positive for cough, sputum production and shortness of breath.   Cardiovascular: Negative for chest pain.  All other systems reviewed and are negative.    Physical Exam Updated Vital Signs BP 118/67   Pulse (!) 143   Temp 98.9 F (37.2 C) (Oral)   Resp 18   Ht 5\' 8"  (1.727 m)   SpO2 98%   Physical Exam  Constitutional: He is oriented to person, place, and time. He appears well-developed and well-nourished. No distress.  HENT:  Head: Normocephalic and atraumatic.  Mouth/Throat: Oropharynx is clear and moist.  Neck: Normal range of motion. Neck supple.  Cardiovascular: Exam reveals no friction rub.   No murmur heard. Heart is irregularly irregular and rapid.  Pulmonary/Chest: Effort normal and breath sounds normal. No respiratory distress. He has no wheezes. He has  no rales.  Abdominal: Soft. Bowel sounds are normal. He exhibits no distension. There is no tenderness.  Musculoskeletal: Normal range of motion. He exhibits edema.  There is 1+ edema of both lower extremities.   Neurological: He is alert and oriented to person, place, and time. Coordination normal.  Skin: Skin is warm and dry. He is not diaphoretic.  Nursing note and vitals reviewed.    ED Treatments / Results  Labs (all labs ordered are listed, but only abnormal results are displayed) Labs Reviewed  BASIC METABOLIC PANEL  CBC WITH DIFFERENTIAL/PLATELET  BRAIN NATRIURETIC PEPTIDE  TROPONIN I  DIGOXIN LEVEL    EKG  EKG Interpretation  Date/Time:  Monday November 07 2016 14:27:32 EST Ventricular Rate:  134 PR Interval:    QRS Duration: 119 QT Interval:  338 QTC Calculation: 505 R Axis:   -85 Text Interpretation:  Atrial fibrillation with rapid V-rate RBBB and LAFB Nonspecific T wave abnormality Confirmed by Papa [place]field  MD, Louis Ivery ([id number]) on 11/07/2016 2:37:48 PM       Radiology No results found.  Procedures Procedures (including critical care time)  Medications Ordered in ED Medications  methylPREDNISolone sodium succinate (SOLU-MEDROL) 125 mg/2 mL injection 125 mg (not administered)  diltiazem (CARDIZEM) 1 mg/mL load via infusion 10 mg (not administered)    And  diltiazem (CARDIZEM) 100 mg in dextrose 5 % 100 mL (1 mg/mL) infusion (not administered)     Initial Impression / Assessment and Plan / ED Course  I have reviewed the triage vital signs and the nursing notes.  Pertinent labs & imaging results that were available during my care of the patient were reviewed by me and considered in my medical decision making (see chart for details).  Clinical Course     Patient presents here with shortness of breath that appears to be multi-factorial. He has a history of CHF, COPD, pulmonary  embolism, and atrial fibrillation. His workup reveals A. fib with RVR, hemoglobin of  5.1 with heme positive dark stools, and mildly positive troponin. He also has an elevated BNP.  He will be transfused 2 units of packed red cells. He was started on Cardizem, however this did drop his pressure and had to be turned down. I have spoken with Dr. Olevia Bowens from the hospitalist service who agrees to admit.  CRITICAL CARE Performed by: Veryl Speak Total critical care time: 45 minutes Critical care time was exclusive of separately billable procedures and treating other patients. Critical care was necessary to treat or prevent imminent or life-threatening deterioration. Critical care was time spent personally by me on the following activities: development of treatment plan with patient and/or surrogate as well as nursing, discussions with consultants, evaluation of patient's response to treatment, examination of patient, obtaining history from patient or surrogate, ordering and performing treatments and interventions, ordering and review of laboratory studies, ordering and review of radiographic studies, pulse oximetry and re-evaluation of patient's condition.   Final Clinical Impressions(s) / ED Diagnoses   Final diagnoses:  None    New Prescriptions New Prescriptions   No medications on file     Veryl Speak, MD 11/07/16 [place], MD 11/07/16 1625

## 2016-11-08 ENCOUNTER — Inpatient Hospital Stay (HOSPITAL_COMMUNITY): Payer: Medicare Other

## 2016-11-08 DIAGNOSIS — I350 Nonrheumatic aortic (valve) stenosis: Secondary | ICD-10-CM

## 2016-11-08 DIAGNOSIS — J810 Acute pulmonary edema: Secondary | ICD-10-CM

## 2016-11-08 LAB — CBC WITH DIFFERENTIAL/PLATELET
BASOS PCT: 0 %
Basophils Absolute: 0 10*3/uL (ref 0.0–0.1)
EOS ABS: 0 10*3/uL (ref 0.0–0.7)
EOS PCT: 0 %
HCT: 24.9 % — ABNORMAL LOW (ref 39.0–52.0)
Hemoglobin: 7.4 g/dL — ABNORMAL LOW (ref 13.0–17.0)
Lymphocytes Relative: 5 %
Lymphs Abs: 0.3 10*3/uL — ABNORMAL LOW (ref 0.7–4.0)
MCH: 27 pg (ref 26.0–34.0)
MCHC: 29.7 g/dL — AB (ref 30.0–36.0)
MCV: 90.9 fL (ref 78.0–100.0)
MONO ABS: 0.3 10*3/uL (ref 0.1–1.0)
MONOS PCT: 5 %
Neutro Abs: 5.3 10*3/uL (ref 1.7–7.7)
Neutrophils Relative %: 90 %
PLATELETS: 91 10*3/uL — AB (ref 150–400)
RBC: 2.74 MIL/uL — ABNORMAL LOW (ref 4.22–5.81)
RDW: 19.3 % — AB (ref 11.5–15.5)
Smear Review: DECREASED
WBC: 5.9 10*3/uL (ref 4.0–10.5)

## 2016-11-08 LAB — COMPREHENSIVE METABOLIC PANEL
ALT: 13 U/L — AB (ref 17–63)
AST: 26 U/L (ref 15–41)
Albumin: 3 g/dL — ABNORMAL LOW (ref 3.5–5.0)
Alkaline Phosphatase: 70 U/L (ref 38–126)
Anion gap: 6 (ref 5–15)
BUN: 20 mg/dL (ref 6–20)
CHLORIDE: 107 mmol/L (ref 101–111)
CO2: 27 mmol/L (ref 22–32)
CREATININE: 1.15 mg/dL (ref 0.61–1.24)
Calcium: 8 mg/dL — ABNORMAL LOW (ref 8.9–10.3)
GFR, EST NON AFRICAN AMERICAN: 57 mL/min — AB (ref >60)
Glucose, Bld: 132 mg/dL — ABNORMAL HIGH (ref 65–99)
POTASSIUM: 4.5 mmol/L (ref 3.5–5.1)
Sodium: 140 mmol/L (ref 135–145)
Total Bilirubin: 3.4 mg/dL — ABNORMAL HIGH (ref 0.3–1.2)
Total Protein: 6.2 g/dL — ABNORMAL LOW (ref 6.5–8.1)

## 2016-11-08 LAB — CBC
HCT: 25.3 % — ABNORMAL LOW (ref 39.0–52.0)
HCT: 30.1 % — ABNORMAL LOW (ref 39.0–52.0)
HEMOGLOBIN: 9.1 g/dL — AB (ref 13.0–17.0)
Hemoglobin: 7.5 g/dL — ABNORMAL LOW (ref 13.0–17.0)
MCH: 26.9 pg (ref 26.0–34.0)
MCH: 26.9 pg (ref 26.0–34.0)
MCHC: 29.6 g/dL — ABNORMAL LOW (ref 30.0–36.0)
MCHC: 30.2 g/dL (ref 30.0–36.0)
MCV: 90.7 fL (ref 78.0–100.0)
MCV: 89.1 fL (ref 78.0–100.0)
PLATELETS: 92 10*3/uL — AB (ref 150–400)
PLATELETS: 95 10*3/uL — AB (ref 150–400)
RBC: 2.79 MIL/uL — ABNORMAL LOW (ref 4.22–5.81)
RBC: 3.38 MIL/uL — ABNORMAL LOW (ref 4.22–5.81)
RDW: 19.4 % — AB (ref 11.5–15.5)
RDW: 18.9 % — AB (ref 11.5–15.5)
WBC: 6.8 10*3/uL (ref 4.0–10.5)
WBC: 10.5 10*3/uL (ref 4.0–10.5)

## 2016-11-08 LAB — DIGOXIN LEVEL: DIGOXIN LVL: 0.3 ng/mL — AB (ref 0.8–2.0)

## 2016-11-08 LAB — PREPARE RBC (CROSSMATCH)

## 2016-11-08 LAB — GLUCOSE, CAPILLARY
Glucose-Capillary: 139 mg/dL — ABNORMAL HIGH (ref 65–99)
Glucose-Capillary: 136 mg/dL — ABNORMAL HIGH (ref 65–99)

## 2016-11-08 LAB — MRSA PCR SCREENING: MRSA by PCR: NEGATIVE

## 2016-11-08 LAB — TROPONIN I: TROPONIN I: 0.06 ng/mL — AB (ref <0.03)

## 2016-11-08 MED ORDER — SODIUM CHLORIDE 0.9 % IV SOLN: Freq: Once | INTRAVENOUS | Status: AC

## 2016-11-08 MED ORDER — DILTIAZEM HCL 30 MG PO TABS
30.0000 mg | ORAL_TABLET | Freq: Three times a day (TID) | ORAL | Status: DC
  Administered 2016-11-08: 30 mg via ORAL
  Filled 2016-11-08 (×2): qty 1

## 2016-11-08 MED ORDER — FUROSEMIDE 10 MG/ML IJ SOLN
40.0000 mg | Freq: Two times a day (BID) | INTRAMUSCULAR | Status: DC
  Administered 2016-11-08: 40 mg via INTRAVENOUS
  Filled 2016-11-08: qty 4

## 2016-11-08 MED ORDER — HALOPERIDOL LACTATE 5 MG/ML IJ SOLN
5.0000 mg | Freq: Once | INTRAMUSCULAR | Status: AC
  Administered 2016-11-09: 5 mg via INTRAVENOUS
  Filled 2016-11-08: qty 1

## 2016-11-08 MED ORDER — DILTIAZEM HCL 25 MG/5ML IV SOLN
10.0000 mg | Freq: Once | INTRAVENOUS | Status: AC
  Administered 2016-11-09: 10 mg via INTRAVENOUS
  Filled 2016-11-08: qty 5

## 2016-11-08 MED ORDER — LEVALBUTEROL HCL 0.63 MG/3ML IN NEBU
0.6300 mg | INHALATION_SOLUTION | Freq: Three times a day (TID) | RESPIRATORY_TRACT | Status: DC
  Administered 2016-11-08 (×2): 0.63 mg via RESPIRATORY_TRACT
  Filled 2016-11-08 (×4): qty 3

## 2016-11-08 MED ORDER — LORAZEPAM 2 MG/ML IJ SOLN
1.0000 mg | Freq: Once | INTRAMUSCULAR | Status: AC
  Administered 2016-11-08: 1 mg via INTRAVENOUS
  Filled 2016-11-08: qty 1

## 2016-11-08 MED ORDER — FUROSEMIDE 10 MG/ML IJ SOLN
20.0000 mg | Freq: Once | INTRAMUSCULAR | Status: DC
  Filled 2016-11-08: qty 2

## 2016-11-08 MED ORDER — SODIUM CHLORIDE 0.9 % IV SOLN
Freq: Once | INTRAVENOUS | Status: AC
  Administered 2016-11-08: 01:00:00 via INTRAVENOUS

## 2016-11-08 MED ORDER — FUROSEMIDE 10 MG/ML IJ SOLN
40.0000 mg | Freq: Once | INTRAMUSCULAR | Status: AC
  Administered 2016-11-08: 40 mg via INTRAVENOUS

## 2016-11-08 MED ORDER — DIGOXIN 0.25 MG/ML IJ SOLN
0.2500 mg | Freq: Once | INTRAMUSCULAR | Status: AC
  Administered 2016-11-08: 0.25 mg via INTRAVENOUS
  Filled 2016-11-08: qty 2

## 2016-11-08 NOTE — Consult Note (Signed)
CARDIOLOGY CONSULT NOTE   Patient ID: OAKLYN BUSKO MRN: [id number] DOB/AGE: May 08, 1931 82 y.o.  Admit Date: 11/07/2016 Referring Physician: [email or web address] Primary Physician: [name], MD Consulting Cardiologist: Jenkins Rouge Primary Cardiologist: Rozann Lesches MD Reason for Consultation:Afib with RVR  Clinical Summary Mr. Kress is a 82 y.o.male with known history of progressive AoV disease, chronic diastolic CHF, and atrial fib, CHADS VASC Score of 4 on Xarelto, PE,  hypertension,  Last seen in our office by Dr. Domenic Polite in our office in 05/2016 and was stable. 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 Sydell Axon.  He is also followed by Dr. Whitney Muse, hematology/oncology, and gets periodic iron transfusions . He has a remote history of non-Hodgkin's lymphoma,. Per daughter's report, also had a spinal tap which revealed cancerous cells there with questionable brain metastases.   His most recent iron transfusion was in September of this year. The patient has chronic abdominal pain and back pain. He has been haven't Hardee's stools over the last 3-4 days intermittent constipation with mild diarrhea. The patient states on Sunday, he was unable to breathe, became very weak. His daughter who cares for him called EMS. The patient was unaware that his heart rate was fast but did complain of some mild chest pressure.  He presented to the ER with complains of worsening fatigue, dyspnea and back pain. On arrival to ER his was found to be in atrial fib with HR of 146 bpm. EKG demonstrated RBBB, atrial fib.  He was found to have a Hgb of 5.1, Hct 18.8. Platelets of 123. Creatinine 1.36, BNP 619, troponin 0.06, dig level of <0.2.  CXR demonstrated pulmonary edema with moderate right pleural effusion, possible bibasilar pneumonia verses airspace disease or  atelectasis.   He was treated with solumedrol IV 125 mg, given diltiazem 10 mg bolus. And started on IV gtt. Given IV famotidine, and digoxin with reloading, placed on O2 and given I unit of PRBC's. Xarelto was discontinued. Diltiazem was discontinued in the setting of hypotension 86/76. While in ICU he was given IV lasix 20 mg with good diureses.1.2 liters.  Hgb has improved to 7.3 after transfusion. He is currently on his second blood transfusion.   Allergies  Allergen Reactions  . Cymbalta [Duloxetine Hcl] Other (See Comments)    Confusion   . Procaine Hcl Hives and Other (See Comments)    NOVOCAINE: Sweating, Confusion, Not in right state of mind.  [name]    unknown unknown    Medications Scheduled Medications: . sodium chloride   Intravenous Once  . digoxin  0.25 mg Intravenous Daily  . famotidine (PEPCID) IV  20 mg Intravenous Q12H  . furosemide  20 mg Intravenous Once  . ipratropium  0.5 mg Nebulization TID  . levalbuterol  0.63 mg Nebulization TID  . sodium chloride flush  3 mL Intravenous Q12H     Infusions: . diltiazem (CARDIZEM) infusion 15 mg/hr (11/08/16 0459)     PRN Medications:  HYDROmorphone (DILAUDID) injection, levalbuterol   Past Medical History:  Diagnosis Date  . Anemia   . Aortic stenosis, moderate 05/21/2015  . Asthma   . B12 deficiency 07/28/2016  . Cellulitis of leg, left 01/03/2015  . Cervical spondylosis without myelopathy   . Cholelithiasis   . Chronic atrial fibrillation ([place])   . Chronic diastolic heart failure ([place])   .  Chronic lower back pain   . Cirrhosis ([place])    Questionable, AFP normal Feb 01/2012, hx of ETOH use   . COPD (chronic obstructive pulmonary disease) (HCC)    Uses occasional nighttime O2  . Diffuse large B cell lymphoma ([place]) 08/22/2015  . Disseminated herpes zoster 2010  . DNR (do not resuscitate) 11/01/2015  . Dysrhythmia    chronic AFib  . Essential hypertension, benign   .  GERD (gastroesophageal reflux disease)   . Headache(784.0)   . History of chicken pox 1941; 2011  . Iron deficiency anemia 01/18/2012  . Lupus anticoagulant positive   . Mixed hyperlipidemia   . Pulmonary embolus ([place])    2003 and 2011  . Pulmonary nodules    Chest CT 09/11  . Rectal ulcer 12/01/2015  . Rheumatoid arthritis(714.0)   . Seizure disorder ([place]) 09/2015  . Stage III chronic kidney disease   . Telangiectasia 11/29/2015   ANTRUM AND JEJUNUM    Past Surgical History:  Procedure Laterality Date  . BACK SURGERY    . CATARACT EXTRACTION W/ INTRAOCULAR LENS  IMPLANT, BILATERAL    . CHOLECYSTECTOMY  05/23/2012   Procedure: LAPAROSCOPIC CHOLECYSTECTOMY WITH INTRAOPERATIVE CHOLANGIOGRAM;  Surgeon: Stark Klein, MD;  Location: [facility];  Service: General;  Laterality: N/A;  . COLONOSCOPY  01/24/2013   [id number] polyps/colonic diverticulosis  . COLONOSCOPY N/A 12/01/2015   Procedure: COLONOSCOPY;  Surgeon: Danie Binder, MD;  [facility];  Service: Endoscopy;  Laterality: N/A;  . ESOPHAGOGASTRODUODENOSCOPY  02/02/12   [id number] size hiatal hernia; otherwise normal exam  . ESOPHAGOGASTRODUODENOSCOPY N/A 11/29/2015   [id number] diverticulosis/small internal hemorrhoids/left colon is redundant  . LYMPH NODE BIOPSY    . LYMPH NODE BIOPSY Right 09/11/2015   Procedure: RIGHT INGUINAL LYMPH NODE BIOPSY;  Surgeon: Aviva Signs, MD;  Location: [facility];  Service: General;  Laterality: Right;  . MYRINGOTOMY     "3 times; both ears"  . POLYPECTOMY  02/02/2012   RMR: Multiple colonic polyps removed, flat, tubular adenomas/ Left-sided diverticulosis  . PORTACATH PLACEMENT    . PORTACATH PLACEMENT Left 09/11/2015   Procedure: INSERTION PORT-A-CATH;  Surgeon: Aviva Signs, MD;  Location: [facility];  Service: General;  Laterality: Left;  . POSTERIOR LUMBAR VERTEBRAE EXCISION  2003; 2009; 2011    Family History  Problem Relation Age of Onset  . Diabetes Mother   . Colon cancer Neg Hx     . Liver disease Neg Hx      Social History Mr. Monto reports that he quit smoking about 18 years ago. His smoking use included Cigarettes. He has a 57.00 pack-year smoking history. He has never used smokeless tobacco. Mr. Dufrene reports that he does not drink alcohol.  Review of Systems Complete review of systems are found to be negative unless outlined in H&P above.  Physical Examination Blood pressure 110/90, pulse 110, temperature 97.6 F (36.4 C), temperature source Oral, resp. rate 23, height 5\' 8"  (1.727 m), weight 203 lb 14.8 oz (92.5 kg), SpO2 97 %.  Intake/Output Summary (Last 24 hours) at 11/08/16 1040 Last data filed at 11/08/16 0807  Gross per 24 hour  Intake              717 ml  Output             2000 ml  Net            -1283 ml    Telemetry:Atrial fibrillation with RVR rates up to 1130-136 bpm  GEN: No acute distress, having some abdominal soreness. HEENT: Conjunctiva and lids normal, oropharynx clear with moist mucosa. Neck: Supple, no elevated JVP or carotid bruits, no thyromegaly. Lungs: Diminished in the bases with some inspiratory crackles Cardiac: irregular rate and rhythm, 2/6 holosystolic murmur with radiation to the carotids bilaterally,  no pericardial rub. Abdomen: Soft, tenderness on palpation, no hepatomegaly, bowel sounds present, no guarding or rebound. Extremities: No pitting edema, distal pulses 2+. Skin: Warm and dry. Musculoskeletal: No kyphosis. The patient currently has a Port-A-Cath located on the left. Neuropsychiatric: Alert and oriented x3, affect grossly appropriate.  Prior Cardiac Testing/Procedures  Echocardiogram 05/20/2015.    Left ventricle: The cavity size was normal. Wall thickness was   increased in a pattern of mild LVH. Systolic function was   vigorous. The estimated ejection fraction was in the range of 65%   to 70%. Wall motion was normal; there were no regional wall   motion abnormalities. The study is not technically  sufficient to   allow evaluation of LV diastolic function. - Aortic valve: There was moderate to severe stenosis. Mean   gradient (S): 23 mm Hg. Peak gradient (S): 54 mm Hg. VTI ratio of   LVOT to aortic valve: 0.34. Valve area (VTI): 1.07 cm^2. Valve   area (Vmax): 1.08 cm^2. Valve area (Vmean): 1.21 cm^2. - Mitral valve: Calcified annulus. Mildly calcified leaflets . Mean   gradient (D): 4 mm Hg. Valve area by pressure half-time: 2.24   cm^2. Valve area by continuity equation (using LVOT flow): 1.67   cm^2. - Left atrium: The atrium was moderately dilated. - Right atrium: Central venous pressure (est): 3 mm Hg. - Atrial septum: There was increased thickness of the septum,   consistent with lipomatous hypertrophy. No defect or patent   foramen ovale was identified. - Tricuspid valve: There was trivial regurgitation. - Pulmonary arteries: Systolic pressure could not be accurately   estimated. - Pericardium, extracardiac: There was no pericardial effusion.   Lab Results  Basic Metabolic Panel:  Recent Labs Lab 11/07/16 1440 11/08/16 0417  NA 141 140  K 4.4 4.5  CL 107 107  CO2 30 27  GLUCOSE 116* 132*  BUN 18 20  CREATININE 1.36* 1.15  CALCIUM 7.7* 8.0*  MG 2.2  --     Liver Function Tests:  Recent Labs Lab 11/08/16 0417  AST 26  ALT 13*  ALKPHOS 70  BILITOT 3.4*  PROT 6.2*  ALBUMIN 3.0*    CBC:  Recent Labs Lab 11/07/16 1440 11/07/16 2122 11/08/16 0417  WBC 8.1  --  5.9  NEUTROABS 7.0  --  5.3  HGB 5.1* 4.9* 7.4*  HCT 18.8* 17.3* 24.9*  MCV 98.4  --  90.9  PLT 123*  --  91*    Cardiac Enzymes:  Recent Labs Lab 11/07/16 1440 11/07/16 2122 11/08/16 0417  TROPONINI 0.06* 0.06* 0.06*    Radiology: Dg Chest Port 1 View  Result Date: 11/07/2016 CLINICAL DATA:  Worsening cough and shortness of breath. EXAM: PORTABLE CHEST 1 VIEW COMPARISON:  Single-view of the chest 05/28/2016 and 11/28/2015. FINDINGS: There is interstitial pulmonary edema.  Moderate right pleural effusion with associated basilar airspace disease is noted. Port-A-Cath is in place. Heart size is upper normal. No pneumothorax. IMPRESSION: Interstitial pulmonary edema with a moderate right pleural effusion and basilar airspace disease which could be atelectasis or pneumonia. Electronically Signed   By: Inge Rise M.D.   On: 11/07/2016 15:08     [id number] fibrillation with  RVR, right bundle branch block. Rate 142 bpm.   Impression and Recommendations 1. Atrial fibrillation with RVR: Known history of same but had been rate controlled in June 2017. Multifactorial reasons for rapid heart rate, these include severe anemia, abdominal pain, chronic illness. Agree with stopping Xarelto. With ongoing need for blood transfusions and patient's self-reported melena and Hardee's stools this risk is too high and outweighed by the benefit of CVA protection.  Agree with reloading of digoxin IV, can consider amiodarone in lieu of diltiazem in this setting of hypotension.His IV diltiazem had been discontinued due to loss of IV access.  With profound anemia heart rate control may be difficult until he is hemodynamically stable. In the setting of COPD, will discuss with Dr. Olive Bass and prior to instituting. Echocardiogram is pending.  2. Acute pulmonary edema: Likely related to profound anemia. Most recent echocardiogram revealed normal LV systolic function. He has been given IV Lasix, would continue IV Lasix twice a day.  3. Severe aortic valve stenosis: With multiple core morbidities, not certain that he would be a candidate or aortic valve repair at this time, could possibly be revisited once he has stabilized. This had been discussed in the past with the patient by Dr. Domenic Polite, this did include discussion of TAVR. We'll defer any more discussion concerning aortic valve repair until heart rate is controlled and bleeding has stopped.  4. History of non-Hodgkin's lymphoma: Per daughter  he is in remission but now has evidence of cancer in spinal fluid and possibly brain. 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 He was not found to be a candidate for IT therapy, and has been transitioned to palliative care. Defer to oncology for further workup and treatment plan.  5. Chronic iron deficiency anemia: On iron replacement, and has been receiving periodic blood transfusions through hematology/oncology.    Signed: Phill Myron. Lawrence NP [place]  11/08/2016, 10:40 AM Co-Sign MD  Patient examined chart reviewed. Discussed care with family , nurse, NP Chronically ill male with chronic afib and moderate AS. CML with recurrent Right pleural effusion. Exam with marked decreased BS right mid/base  AS murmur and telemetry with afib rates 110-125.  Dyspnea from anemia COPD and recurrent large right pleural effusion. Last thoracentesis not Diagnostic for CML involvement but suspicious with non monoclonal lymphocytes Suggest diuresis, transfuse to Hb 10 use PO cardizem for rate control for now Xarelto on hold Would get oncology involved for goals of care and suggest IR radiology thoracentesis for right effusion again  Jenkins Rouge

## 2016-11-08 NOTE — Progress Notes (Signed)
Pt is extremely agitated, confused, more so than usual according to family member who is present. Pt family member is able to reduce anxiety to some extent but not sufficient to reduce obvious distress. Notified mid-level for orders, received. Pt is currently asleep in bed with family at bedside.

## 2016-11-08 NOTE — Progress Notes (Signed)
Pt is maxed out on cardizem gtt, still tachy in the 130's-150's. Received 2 units of PRBCs, pt started having SOB. MD ordered 40 of Lasix and Pt is now showing no s/s of distress at this time. Will continue to monitor. MD came to visit pt this morning and ordered digoxin .25mg  iv push once. There is an order to transfuse another unit of blood but the doctor is wanting to hold off on giving the 3rd transfusion. Notify MD with any new changes and Hgb level to proceed or not with the administration of blood since he wanted to get the pts HR under control first.   Ericka Pontiff, RN  7:20 AM  11/08/16

## 2016-11-08 NOTE — Progress Notes (Signed)
PROGRESS NOTE    [name]  [id number] DOB: 04/28/1931 DOA: 11/07/2016 PCP: [name], MD    Brief Narrative:  82 y.o. male with medical history significant of chronic iron deficiency anemia, telangiectasia of antrum and jejunum,  paroxysmal atrial fibrillation on Xarelto, chronic diastolic heart failure, [id number] deficiency, COPD, diffuse large B cell lymphoma, HX of disseminated herpes zoster, essential hypertension, hyperlipidemia, GERD, lupus anticoagulant, rheumatoid arthritis, pulmonary embolism in 2000 03/04/2010, seizure disorder, stage III chronic kidney disease who comes to the emergency department with complaints of progressively worse fatigue, dyspnea and back pain. He has had several episodes of melena recently.  Diagnosed with Afib with RVR. Cardiology consulted for further medical management   Assessment & Plan:   Principal Problem:   Atrial fibrillation with rapid ventricular response (HCC) - rate controlled currently - obtain Cardiology consult for further evaluation and recommendations from their standpoint. - Holding anticoagulant given history of melena and anemia requiring transfusion    Acute on chronic diastolic CHF (congestive heart failure) ([place]) - Transfuse blood slowly -Continue strict intake and output monitoring daily weights. - BNP elevated upon initial evaluation in the ED. - Lasix 20 mg IV ordered - Cardiology consulted  Elevated troponin - Consulted cardiology but suspect in lieu of profound anemia requiring transfusion as well as A. fib with RVR this is most likely secondary to demand ischemia. Continue to monitor on telemetry    Active Problems:   Essential hypertension, benign - on cardizem and well controlled    COPD (chronic obstructive pulmonary disease) (HCC) - stable no wheezes, continue xopenex    Stage III chronic kidney disease - S creatinine wnl. stable     Aortic stenosis, moderate   BPH (benign prostatic  hyperplasia) - Stable    Telangiectasia - No active bleeding. Prior to consulting GI would like heart rate stable as GI would not consider procedures with patient having uncontrolled heart rate.    Symptomatic anemia - Given cardiac history would like hemoglobin levels above 8.0 as such will place order to transfuse another unit slowly given history of CHF.   DVT prophylaxis: SCD's Code Status: DNR Family Communication: None at bedside Disposition Plan: Continue to monitor in step down unit   Consultants:   None   Procedures: None   Antimicrobials: None   Subjective: Patient denies any bright red blood per rectum. Discussed with nursing, denies any blood in the last stool. The patient has no new complaints currently.  Objective: Vitals:   11/08/16 0330 11/08/16 0445 11/08/16 0738 11/08/16 0807  BP:      Pulse:      Resp:      Temp: 98.5 F (36.9 C)   97.6 F (36.4 C)  TempSrc: Oral   Oral  SpO2:  99% 97%   Weight:      Height:        Intake/Output Summary (Last 24 hours) at 11/08/16 0949 Last data filed at 11/08/16 [id number]  Gross per 24 hour  Intake              717 ml  Output             2000 ml  Net            -1283 ml   Filed Weights   11/07/16 1932  Weight: 92.5 kg (203 lb 14.8 oz)    Examination:  General exam: Appears calm and comfortable , In no acute distress Respiratory system: Clear to auscultation. Respiratory effort  normal. No wheezes Cardiovascular system: Irregularly irregular, no rubs, no cyanosis Gastrointestinal system: Abdomen is nondistended, soft and nontender. No organomegaly or masses felt. Normal bowel sounds heard. Central nervous system: Alert and oriented. No focal neurological deficits. Extremities: Symmetric 5 x 5 power. Skin: No rashes, lesions or ulcers on limited exam Psychiatry: Judgement and insight appear normal. Mood & affect appropriate.   Data Reviewed: I have personally reviewed following labs and imaging  studies  CBC:  Recent Labs Lab 11/07/16 1440 11/07/16 2122 11/08/16 0417  WBC 8.1  --  5.9  NEUTROABS 7.0  --  5.3  HGB 5.1* 4.9* 7.4*  HCT 18.8* 17.3* 24.9*  MCV 98.4  --  90.9  PLT 123*  --  91*   Basic Metabolic Panel:  Recent Labs Lab 11/07/16 1440 11/08/16 0417  NA 141 140  K 4.4 4.5  CL 107 107  CO2 30 27  GLUCOSE 116* 132*  BUN 18 20  CREATININE 1.36* 1.15  CALCIUM 7.7* 8.0*  MG 2.2  --    GFR: Estimated Creatinine Clearance: 53.7 mL/min (by C-G formula based on SCr of 1.15 mg/dL). Liver Function Tests:  Recent Labs Lab 11/08/16 0417  AST 26  ALT 13*  ALKPHOS 70  BILITOT 3.4*  PROT 6.2*  ALBUMIN 3.0*   No results for input(s): LIPASE, AMYLASE in the last 168 hours. No results for input(s): AMMONIA in the last 168 hours. Coagulation Profile: No results for input(s): INR, PROTIME in the last 168 hours. Cardiac Enzymes:  Recent Labs Lab 11/07/16 1440 11/07/16 2122 11/08/16 0417  TROPONINI 0.06* 0.06* 0.06*   BNP (last 3 results) No results for input(s): PROBNP in the last 8760 hours. HbA1C: No results for input(s): HGBA1C in the last 72 hours. CBG:  Recent Labs Lab 11/08/16 0556  GLUCAP 139*   Lipid Profile: No results for input(s): CHOL, HDL, LDLCALC, TRIG, CHOLHDL, LDLDIRECT in the last 72 hours. Thyroid Function Tests: No results for input(s): TSH, T4TOTAL, FREET4, T3FREE, THYROIDAB in the last 72 hours. Anemia Panel: No results for input(s): VITAMINB12, FOLATE, FERRITIN, TIBC, IRON, RETICCTPCT in the last 72 hours. Sepsis Labs: No results for input(s): PROCALCITON, LATICACIDVEN in the last 168 hours.  Recent Results (from the past 240 hour(s))  MRSA PCR Screening     Status: None   Collection Time: 11/07/16  8:25 PM  Result Value Ref Range Status   MRSA by PCR NEGATIVE NEGATIVE Final    Comment:        The GeneXpert MRSA Assay (FDA approved for NASAL specimens only), is one component of a comprehensive MRSA  colonization surveillance program. It is not intended to diagnose MRSA infection nor to guide or monitor treatment for MRSA infections.          Radiology Studies: Dg Chest Port 1 View  Result Date: 11/07/2016 CLINICAL DATA:  Worsening cough and shortness of breath. EXAM: PORTABLE CHEST 1 VIEW COMPARISON:  Single-view of the chest 05/28/2016 and 11/28/2015. FINDINGS: There is interstitial pulmonary edema. Moderate right pleural effusion with associated basilar airspace disease is noted. Port-A-Cath is in place. Heart size is upper normal. No pneumothorax. IMPRESSION: Interstitial pulmonary edema with a moderate right pleural effusion and basilar airspace disease which could be atelectasis or pneumonia. Electronically Signed   By: Inge Rise M.D.   On: 11/07/2016 15:08        Scheduled Meds: . sodium chloride   Intravenous Once  . digoxin  0.25 mg Intravenous Daily  . famotidine (PEPCID)  IV  20 mg Intravenous Q12H  . furosemide  20 mg Intravenous Once  . ipratropium  0.5 mg Nebulization TID  . levalbuterol  0.63 mg Nebulization TID  . sodium chloride flush  3 mL Intravenous Q12H   Continuous Infusions: . diltiazem (CARDIZEM) infusion 15 mg/hr (11/08/16 0459)     LOS: 1 day   Time spent: > 35 minutes  Velvet Bathe, MD [facility] Pager [phone]  If 7PM-7AM, please contact night-coverage [email or web address] Password [facility] 11/08/2016, 9:49 AM

## 2016-11-09 ENCOUNTER — Other Ambulatory Visit (HOSPITAL_COMMUNITY): Payer: Self-pay | Admitting: Oncology

## 2016-11-09 ENCOUNTER — Inpatient Hospital Stay (HOSPITAL_COMMUNITY): Payer: Medicare Other

## 2016-11-09 DIAGNOSIS — K922 Gastrointestinal hemorrhage, unspecified: Secondary | ICD-10-CM

## 2016-11-09 DIAGNOSIS — I4891 Unspecified atrial fibrillation: Secondary | ICD-10-CM

## 2016-11-09 DIAGNOSIS — Z7901 Long term (current) use of anticoagulants: Secondary | ICD-10-CM

## 2016-11-09 DIAGNOSIS — I781 Nevus, non-neoplastic: Secondary | ICD-10-CM

## 2016-11-09 DIAGNOSIS — D649 Anemia, unspecified: Secondary | ICD-10-CM

## 2016-11-09 LAB — BASIC METABOLIC PANEL
ANION GAP: 8 (ref 5–15)
BUN: 23 mg/dL — ABNORMAL HIGH (ref 6–20)
CALCIUM: 8.1 mg/dL — AB (ref 8.9–10.3)
CO2: 33 mmol/L — AB (ref 22–32)
Chloride: 98 mmol/L — ABNORMAL LOW (ref 101–111)
Creatinine, Ser: 1.24 mg/dL (ref 0.61–1.24)
GFR, EST NON AFRICAN AMERICAN: 52 mL/min — AB (ref >60)
Glucose, Bld: 80 mg/dL (ref 65–99)
Potassium: 3.7 mmol/L (ref 3.5–5.1)
SODIUM: 139 mmol/L (ref 135–145)

## 2016-11-09 LAB — GLUCOSE, CAPILLARY
GLUCOSE-CAPILLARY: 112 mg/dL — AB (ref 65–99)
GLUCOSE-CAPILLARY: 82 mg/dL (ref 65–99)
GLUCOSE-CAPILLARY: 76 mg/dL (ref 65–99)
Glucose-Capillary: 83 mg/dL (ref 65–99)
Glucose-Capillary: 74 mg/dL (ref 65–99)

## 2016-11-09 LAB — RETICULOCYTES
RBC.: 3.21 MIL/uL — AB (ref 4.22–5.81)
RETIC COUNT ABSOLUTE: 125.2 10*3/uL (ref 19.0–186.0)
Retic Ct Pct: 3.9 % — ABNORMAL HIGH (ref 0.4–3.1)

## 2016-11-09 LAB — CBC
HEMATOCRIT: 30.6 % — AB (ref 39.0–52.0)
Hemoglobin: 9.3 g/dL — ABNORMAL LOW (ref 13.0–17.0)
MCH: 27.2 pg (ref 26.0–34.0)
MCHC: 30.4 g/dL (ref 30.0–36.0)
MCV: 89.5 fL (ref 78.0–100.0)
Platelets: 91 10*3/uL — ABNORMAL LOW (ref 150–400)
RBC: 3.42 MIL/uL — ABNORMAL LOW (ref 4.22–5.81)
RDW: 18.4 % — AB (ref 11.5–15.5)
WBC: 9.8 10*3/uL (ref 4.0–10.5)

## 2016-11-09 LAB — IRON AND TIBC
Iron: 10 ug/dL — ABNORMAL LOW (ref 45–182)
SATURATION RATIOS: 3 % — AB (ref 17.9–39.5)
TIBC: 290 ug/dL (ref 250–450)
UIBC: 280 ug/dL

## 2016-11-09 LAB — FERRITIN: Ferritin: 236 ng/mL (ref 24–336)

## 2016-11-09 LAB — FOLATE: FOLATE: 30.6 ng/mL (ref >5.9)

## 2016-11-09 LAB — VITAMIN B12: VITAMIN B 12: 998 pg/mL — AB (ref 180–914)

## 2016-11-09 MED ORDER — TAMSULOSIN HCL 0.4 MG PO CAPS
0.4000 mg | ORAL_CAPSULE | Freq: Every day | ORAL | Status: DC
  Administered 2016-11-10 – 2016-11-14 (×5): 0.4 mg via ORAL
  Filled 2016-11-09 (×5): qty 1

## 2016-11-09 MED ORDER — LEVETIRACETAM IN NACL 500 MG/100ML IV SOLN
500.0000 mg | Freq: Two times a day (BID) | INTRAVENOUS | Status: DC
  Administered 2016-11-09 – 2016-11-13 (×10): 500 mg via INTRAVENOUS
  Filled 2016-11-09 (×13): qty 100

## 2016-11-09 MED ORDER — HYDROMORPHONE HCL 1 MG/ML IJ SOLN
1.0000 mg | Freq: Once | INTRAMUSCULAR | Status: AC
  Administered 2016-11-09: 1 mg via INTRAVENOUS
  Filled 2016-11-09: qty 1

## 2016-11-09 MED ORDER — HALOPERIDOL LACTATE 5 MG/ML IJ SOLN
5.0000 mg | Freq: Four times a day (QID) | INTRAMUSCULAR | Status: DC | PRN
  Administered 2016-11-09: 5 mg via INTRAVENOUS
  Filled 2016-11-09 (×2): qty 1

## 2016-11-09 MED ORDER — MIDAZOLAM HCL 2 MG/2ML IJ SOLN
2.0000 mg | Freq: Once | INTRAMUSCULAR | Status: AC
  Administered 2016-11-09: 2 mg via INTRAVENOUS
  Filled 2016-11-09: qty 2

## 2016-11-09 MED ORDER — MIDODRINE HCL 5 MG PO TABS
5.0000 mg | ORAL_TABLET | Freq: Three times a day (TID) | ORAL | Status: DC
  Administered 2016-11-09 – 2016-11-15 (×17): 5 mg via ORAL
  Filled 2016-11-09 (×16): qty 1

## 2016-11-09 MED ORDER — DEXMEDETOMIDINE HCL IN NACL 200 MCG/50ML IV SOLN
0.1000 ug/kg/h | INTRAVENOUS | Status: DC
  Administered 2016-11-09: 0.1 ug/kg/h via INTRAVENOUS
  Filled 2016-11-09 (×2): qty 50

## 2016-11-09 MED ORDER — DILTIAZEM HCL 25 MG/5ML IV SOLN
20.0000 mg | Freq: Once | INTRAVENOUS | Status: AC
  Administered 2016-11-09: 20 mg via INTRAVENOUS
  Filled 2016-11-09: qty 5

## 2016-11-09 MED ORDER — METHOCARBAMOL 500 MG PO TABS
500.0000 mg | ORAL_TABLET | Freq: Three times a day (TID) | ORAL | Status: DC | PRN
  Administered 2016-11-11 – 2016-11-15 (×7): 500 mg via ORAL
  Filled 2016-11-09 (×9): qty 1

## 2016-11-09 MED ORDER — QUETIAPINE FUMARATE 25 MG PO TABS: 25.0000 mg | ORAL_TABLET | Freq: Two times a day (BID) | ORAL | Status: DC

## 2016-11-09 MED ORDER — DILTIAZEM HCL 60 MG PO TABS
60.0000 mg | ORAL_TABLET | Freq: Three times a day (TID) | ORAL | Status: DC
  Administered 2016-11-09: 60 mg via ORAL
  Filled 2016-11-09: qty 1

## 2016-11-09 MED ORDER — FERROUS SULFATE 325 (65 FE) MG PO TABS
325.0000 mg | ORAL_TABLET | Freq: Every day | ORAL | Status: DC
  Administered 2016-11-09 – 2016-11-11 (×2): 325 mg via ORAL
  Filled 2016-11-09 (×3): qty 1

## 2016-11-09 MED ORDER — QUETIAPINE FUMARATE 25 MG PO TABS
25.0000 mg | ORAL_TABLET | Freq: Two times a day (BID) | ORAL | Status: DC
  Administered 2016-11-09 – 2016-11-15 (×11): 25 mg via ORAL
  Filled 2016-11-09 (×17): qty 1

## 2016-11-09 MED ORDER — AMPICILLIN-SULBACTAM SODIUM 1.5 (1-0.5) G IJ SOLR
1.5000 g | Freq: Four times a day (QID) | INTRAMUSCULAR | Status: DC
  Administered 2016-11-09 – 2016-11-11 (×8): 1.5 g via INTRAVENOUS
  Filled 2016-11-09 (×9): qty 1.5

## 2016-11-09 MED ORDER — HYDROMORPHONE HCL 1 MG/ML IJ SOLN
1.0000 mg | INTRAMUSCULAR | Status: DC | PRN
Start: 2016-11-09 — End: 2016-11-11
  Administered 2016-11-10 (×3): 1 mg via INTRAVENOUS
  Filled 2016-11-09 (×3): qty 1

## 2016-11-09 MED ORDER — FUROSEMIDE 10 MG/ML IJ SOLN
60.0000 mg | Freq: Two times a day (BID) | INTRAMUSCULAR | Status: DC
  Administered 2016-11-09 – 2016-11-12 (×6): 60 mg via INTRAVENOUS
  Filled 2016-11-09 (×7): qty 6

## 2016-11-09 MED ORDER — DILTIAZEM HCL 100 MG IV SOLR
5.0000 mg/h | INTRAVENOUS | Status: DC
  Administered 2016-11-09: 5 mg/h via INTRAVENOUS
  Administered 2016-11-10 – 2016-11-11 (×3): 10 mg/h via INTRAVENOUS
  Filled 2016-11-09 (×5): qty 100

## 2016-11-09 NOTE — Consult Note (Signed)
Reason for Consult: anemia Referring Physician:  Hospitalist  MAXIMILLIANO KERSH is an 82 y.o. male.  HPI: Admitted thru the ED. Presented with c/o SOB, weakness. Atrial fib in ED with a fast ventricular response. Noted to have hemoglobin of 5.1. His stool was dark in color per ED record. He has received 3 units of PRBCs. Patient denies seeing any blood in his stool. Per ED records patient has had some black stools.  Followed by [place] for his anemia. Last iron infusion in September. Hx of atrial fib and maintained on Xarelto.  Hx of  Diffuse large B-cell lymphoma of lymph nodes of multiple regions ([place])  12/01/2015 Colonoscopy: Melena The left colon is redundant. Moderate diverticulosis in the sigmoid colon and descending colon. Two rectal ulcers. Small internal hemorrhoids.   11/29/2015 EGD with endoscopic placement of Given Capsule in stomach; EBL: None  Impression: Small to moderate-sized sliding hiatal hernia. Focal erythema noted to mucosa of the herniated part of the stomach and antrum. No bleeding lesions identified in upper GI tract. Given capsule dropped into the stomach.  Summary & Recommendations: Scattered punctate antral telangiectasia without stigmata of bleed. Three small punctate jejunal telangiectasia without stigmata of bleed. Patient may not have bled from these lesions. Will discuss with Dr. Gala Romney whether he should undergo colonoscopy.  Past Medical History:  Diagnosis Date  . Anemia   . Aortic stenosis, moderate 05/21/2015  . Asthma   . B12 deficiency 07/28/2016  . Cellulitis of leg, left 01/03/2015  . Cervical spondylosis without myelopathy   . Cholelithiasis   . Chronic atrial fibrillation ([place])   . Chronic diastolic heart failure ([place])   . Chronic lower back pain   . Cirrhosis ([place])    Questionable, AFP normal Feb 01/2012, hx of ETOH use   . COPD (chronic obstructive pulmonary disease) (HCC)    Uses occasional nighttime O2  . Diffuse large B cell  lymphoma ([place]) 08/22/2015  . Disseminated herpes zoster 2010  . DNR (do not resuscitate) 11/01/2015  . Dysrhythmia    chronic AFib  . Essential hypertension, benign   . GERD (gastroesophageal reflux disease)   . Headache(784.0)   . History of chicken pox 1941; 2011  . Iron deficiency anemia 01/18/2012  . Lupus anticoagulant positive   . Mixed hyperlipidemia   . Pulmonary embolus ([place])    2003 and 2011  . Pulmonary nodules    Chest CT 09/11  . Rectal ulcer 12/01/2015  . Rheumatoid arthritis(714.0)   . Seizure disorder ([place]) 09/2015  . Stage III chronic kidney disease   . Telangiectasia 11/29/2015   ANTRUM AND JEJUNUM    Past Surgical History:  Procedure Laterality Date  . BACK SURGERY    . CATARACT EXTRACTION W/ INTRAOCULAR LENS  IMPLANT, BILATERAL    . CHOLECYSTECTOMY  05/23/2012   Procedure: LAPAROSCOPIC CHOLECYSTECTOMY WITH INTRAOPERATIVE CHOLANGIOGRAM;  Surgeon: Stark Klein, MD;  Location: [facility];  Service: General;  Laterality: N/A;  . COLONOSCOPY  01/24/2013   VPX:TGGYIRS polyps/colonic diverticulosis  . COLONOSCOPY N/A 12/01/2015   Procedure: COLONOSCOPY;  Surgeon: Danie Binder, MD;  [facility];  Service: Endoscopy;  Laterality: N/A;  . ESOPHAGOGASTRODUODENOSCOPY  02/02/12   WNI:[id number] size hiatal hernia; otherwise normal exam  . ESOPHAGOGASTRODUODENOSCOPY N/A 11/29/2015   XFG:[id number] diverticulosis/small internal hemorrhoids/left colon is redundant  . LYMPH NODE BIOPSY    . LYMPH NODE BIOPSY Right 09/11/2015   Procedure: RIGHT INGUINAL LYMPH NODE BIOPSY;  Surgeon: Aviva Signs, MD;  Location: [facility];  Service: General;  Laterality: Right;  . MYRINGOTOMY     "3 times; both ears"  . POLYPECTOMY  02/02/2012   RMR: Multiple colonic polyps removed, flat, tubular adenomas/ Left-sided diverticulosis  . PORTACATH PLACEMENT    . PORTACATH PLACEMENT Left 09/11/2015   Procedure: INSERTION PORT-A-CATH;  Surgeon: Aviva Signs, MD;  Location: [facility];  Service: General;   Laterality: Left;  . POSTERIOR LUMBAR VERTEBRAE EXCISION  2003; 2009; 2011    Family History  Problem Relation Age of Onset  . Diabetes Mother   . Colon cancer Neg Hx   . Liver disease Neg Hx     Social History:  reports that he quit smoking about 18 years ago. His smoking use included Cigarettes. He has a 57.00 pack-year smoking history. He has never used smokeless tobacco. He reports that he does not drink alcohol or use drugs.  Allergies:  Allergies  Allergen Reactions  . Cymbalta [Duloxetine Hcl] Other (See Comments)    Confusion   . Procaine Hcl Hives and Other (See Comments)    NOVOCAINE: Sweating, Confusion, Not in right state of mind.  [name]    unknown unknown    Medications: I have reviewed the patient's current medications.  Results for orders placed or performed during the hospital encounter of 11/07/16 (from the past 48 hour(s))  Basic metabolic panel     Status: Abnormal   Collection Time: 11/07/16  2:40 PM  Result Value Ref Range   Sodium 141 135 - 145 mmol/L   Potassium 4.4 3.5 - 5.1 mmol/L   Chloride 107 101 - 111 mmol/L   CO2 30 22 - 32 mmol/L   Glucose, Bld 116 (H) 65 - 99 mg/dL   BUN 18 6 - 20 mg/dL   Creatinine, Ser 1.36 (H) 0.61 - 1.24 mg/dL   Calcium 7.7 (L) 8.9 - 10.3 mg/dL   GFR calc non Af Amer 46 (L) >60 mL/min   GFR calc Af Amer 54 (L) >60 mL/min    Comment: (NOTE) The eGFR has been calculated using the CKD EPI equation. This calculation has not been validated in all clinical situations. eGFR's persistently <60 mL/min signify possible Chronic Kidney Disease.    Anion gap 4 (L) 5 - 15  CBC with Differential     Status: Abnormal   Collection Time: 11/07/16  2:40 PM  Result Value Ref Range   WBC 8.1 4.0 - 10.5 K/uL   RBC 1.91 (L) 4.22 - 5.81 MIL/uL   Hemoglobin 5.1 (LL) 13.0 - 17.0 g/dL    Comment: CRITICAL RESULT CALLED TO, READ BACK BY AND VERIFIED WITH: KENDRICK,J. AT 1516 ON 11/07/2016 BY  BAUGHAM,M.    HCT 18.8 (L) 39.0 - 52.0 %   MCV 98.4 78.0 - 100.0 fL   MCH 26.7 26.0 - 34.0 pg   MCHC 27.1 (L) 30.0 - 36.0 g/dL   RDW 16.1 (H) 11.5 - 15.5 %   Platelets 123 (L) 150 - 400 K/uL   Neutrophils Relative % 86 %   Neutro Abs 7.0 1.7 - 7.7 K/uL   Lymphocytes Relative 5 %   Lymphs Abs 0.4 (L) 0.7 - 4.0 K/uL   Monocytes Relative 8 %   Monocytes Absolute 0.6 0.1 - 1.0 K/uL   Eosinophils Relative 0 %   Eosinophils Absolute 0.0 0.0 - 0.7 K/uL   Basophils Relative 0 %   Basophils Absolute 0.0 0.0 - 0.1 K/uL  Brain natriuretic peptide  Status: Abnormal   Collection Time: 11/07/16  2:40 PM  Result Value Ref Range   B Natriuretic Peptide 619.0 (H) 0.0 - 100.0 pg/mL  Troponin I     Status: Abnormal   Collection Time: 11/07/16  2:40 PM  Result Value Ref Range   Troponin I 0.06 (HH) <0.03 ng/mL    Comment: CRITICAL RESULT CALLED TO, READ BACK BY AND VERIFIED WITH: KENDRICK,J ON 11/13 17 AT 1550 BY LOY,C   Digoxin level     Status: Abnormal   Collection Time: 11/07/16  2:40 PM  Result Value Ref Range   Digoxin Level <0.2 (L) 0.8 - 2.0 ng/mL    Comment: RESULTS CONFIRMED BY MANUAL DILUTION  Magnesium     Status: None   Collection Time: 11/07/16  2:40 PM  Result Value Ref Range   Magnesium 2.2 1.7 - 2.4 mg/dL  POC occult blood, ED Provider will collect     Status: Abnormal   Collection Time: 11/07/16  3:32 PM  Result Value Ref Range   Fecal Occult Bld POSITIVE (A) NEGATIVE  Prepare RBC     Status: None   Collection Time: 11/07/16  3:54 PM  Result Value Ref Range   Order Confirmation ORDER PROCESSED BY BLOOD BANK   Type and screen [facility]     Status: None (Preliminary result)   Collection Time: 11/07/16  3:54 PM  Result Value Ref Range   ABO/RH(D) O NEG    Antibody Screen NEG    Sample Expiration 11/10/2016    Unit Number [id number]    Blood Component Type RED CELLS,LR    Unit division 00    Status of Unit ISSUED,FINAL    Transfusion Status OK TO  TRANSFUSE    Crossmatch Result Compatible    Unit Number [id number]    Blood Component Type RED CELLS,LR    Unit division 00    Status of Unit ISSUED,FINAL    Transfusion Status OK TO TRANSFUSE    Crossmatch Result Compatible    Unit Number [id number]    Blood Component Type RED CELLS,LR    Unit division 00    Status of Unit ISSUED,FINAL    Transfusion Status OK TO TRANSFUSE    Crossmatch Result Compatible    Unit Number [id number]    Blood Component Type RED CELLS,LR    Unit division 00    Status of Unit ALLOCATED    Transfusion Status OK TO TRANSFUSE    Crossmatch Result Compatible   Prepare RBC     Status: None   Collection Time: 11/07/16  3:54 PM  Result Value Ref Range   Order Confirmation ORDER PROCESSED BY BLOOD BANK   MRSA PCR Screening     Status: None   Collection Time: 11/07/16  8:25 PM  Result Value Ref Range   MRSA by PCR NEGATIVE NEGATIVE    Comment:        The GeneXpert MRSA Assay (FDA approved for NASAL specimens only), is one component of a comprehensive MRSA colonization surveillance program. It is not intended to diagnose MRSA infection nor to guide or monitor treatment for MRSA infections.   Troponin I     Status: Abnormal   Collection Time: 11/07/16  9:22 PM  Result Value Ref Range   Troponin I 0.06 (HH) <0.03 ng/mL    Comment: CRITICAL VALUE NOTED.  VALUE IS CONSISTENT WITH PREVIOUSLY REPORTED AND CALLED VALUE.  Hematocrit     Status: Abnormal   Collection Time: 11/07/16  9:22  PM  Result Value Ref Range   HCT 17.3 (L) 39.0 - 52.0 %  Hemoglobin     Status: Abnormal   Collection Time: 11/07/16  9:22 PM  Result Value Ref Range   Hemoglobin 4.9 (LL) 13.0 - 17.0 g/dL    Comment: REPEATED TO VERIFY POST TRANSFUSION SPECIMEN CRITICAL RESULT CALLED TO, READ BACK BY AND VERIFIED WITH: GAVIN B AT 2149 ON [id number] BY FORSYTH K   CBC with Differential/Platelet     Status: Abnormal   Collection Time: 11/08/16  4:17 AM  Result Value Ref Range    WBC 5.9 4.0 - 10.5 K/uL   RBC 2.74 (L) 4.22 - 5.81 MIL/uL   Hemoglobin 7.4 (L) 13.0 - 17.0 g/dL    Comment: DELTA CHECK NOTED POST TRANSFUSION SPECIMEN    HCT 24.9 (L) 39.0 - 52.0 %   MCV 90.9 78.0 - 100.0 fL    Comment: DELTA CHECK NOTED POST TRANSFUSION SPECIMEN    MCH 27.0 26.0 - 34.0 pg   MCHC 29.7 (L) 30.0 - 36.0 g/dL   RDW 19.3 (H) 11.5 - 15.5 %   Platelets 91 (L) 150 - 400 K/uL    Comment: REPEATED TO VERIFY SPECIMEN CHECKED FOR CLOTS POST TRANSFUSION SPECIMEN    Neutrophils Relative % 90 %   Neutro Abs 5.3 1.7 - 7.7 K/uL   Lymphocytes Relative 5 %   Lymphs Abs 0.3 (L) 0.7 - 4.0 K/uL   Monocytes Relative 5 %   Monocytes Absolute 0.3 0.1 - 1.0 K/uL   Eosinophils Relative 0 %   Eosinophils Absolute 0.0 0.0 - 0.7 K/uL   Basophils Relative 0 %   Basophils Absolute 0.0 0.0 - 0.1 K/uL   RBC Morphology ELLIPTOCYTES     Comment: STOMATOCYTES ROULEAUX    Smear Review PLATELETS APPEAR DECREASED   Comprehensive metabolic panel     Status: Abnormal   Collection Time: 11/08/16  4:17 AM  Result Value Ref Range   Sodium 140 135 - 145 mmol/L   Potassium 4.5 3.5 - 5.1 mmol/L   Chloride 107 101 - 111 mmol/L   CO2 27 22 - 32 mmol/L   Glucose, Bld 132 (H) 65 - 99 mg/dL   BUN 20 6 - 20 mg/dL   Creatinine, Ser 1.15 0.61 - 1.24 mg/dL   Calcium 8.0 (L) 8.9 - 10.3 mg/dL   Total Protein 6.2 (L) 6.5 - 8.1 g/dL   Albumin 3.0 (L) 3.5 - 5.0 g/dL   AST 26 15 - 41 U/L   ALT 13 (L) 17 - 63 U/L   Alkaline Phosphatase 70 38 - 126 U/L   Total Bilirubin 3.4 (H) 0.3 - 1.2 mg/dL   GFR calc non Af Amer 57 (L) >60 mL/min   GFR calc Af Amer >60 >60 mL/min    Comment: (NOTE) The eGFR has been calculated using the CKD EPI equation. This calculation has not been validated in all clinical situations. eGFR's persistently <60 mL/min signify possible Chronic Kidney Disease.    Anion gap 6 5 - 15  Digoxin level     Status: Abnormal   Collection Time: 11/08/16  4:17 AM  Result Value Ref Range    Digoxin Level 0.3 (L) 0.8 - 2.0 ng/mL  Troponin I     Status: Abnormal   Collection Time: 11/08/16  4:17 AM  Result Value Ref Range   Troponin I 0.06 (HH) <0.03 ng/mL    Comment: CRITICAL VALUE NOTED.  VALUE IS CONSISTENT WITH PREVIOUSLY REPORTED AND CALLED  VALUE.  Glucose, capillary     Status: Abnormal   Collection Time: 11/08/16  5:56 AM  Result Value Ref Range   Glucose-Capillary 139 (H) 65 - 99 mg/dL  Prepare RBC     Status: None   Collection Time: 11/08/16  9:00 AM  Result Value Ref Range   Order Confirmation ORDER PROCESSED BY BLOOD BANK   CBC     Status: Abnormal   Collection Time: 11/08/16 10:29 AM  Result Value Ref Range   WBC 6.8 4.0 - 10.5 K/uL   RBC 2.79 (L) 4.22 - 5.81 MIL/uL   Hemoglobin 7.5 (L) 13.0 - 17.0 g/dL   HCT 25.3 (L) 39.0 - 52.0 %   MCV 90.7 78.0 - 100.0 fL   MCH 26.9 26.0 - 34.0 pg   MCHC 29.6 (L) 30.0 - 36.0 g/dL   RDW 19.4 (H) 11.5 - 15.5 %   Platelets 92 (L) 150 - 400 K/uL    Comment: SPECIMEN CHECKED FOR CLOTS CONSISTENT WITH PREVIOUS RESULT   Glucose, capillary     Status: Abnormal   Collection Time: 11/08/16 11:45 AM  Result Value Ref Range   Glucose-Capillary 136 (H) 65 - 99 mg/dL   Comment 1 Notify RN    Comment 2 Document in Chart   CBC     Status: Abnormal   Collection Time: 11/08/16  5:58 PM  Result Value Ref Range   WBC 10.5 4.0 - 10.5 K/uL   RBC 3.38 (L) 4.22 - 5.81 MIL/uL   Hemoglobin 9.1 (L) 13.0 - 17.0 g/dL   HCT 30.1 (L) 39.0 - 52.0 %   MCV 89.1 78.0 - 100.0 fL   MCH 26.9 26.0 - 34.0 pg   MCHC 30.2 30.0 - 36.0 g/dL   RDW 18.9 (H) 11.5 - 15.5 %   Platelets 95 (L) 150 - 400 K/uL    Comment: SPECIMEN CHECKED FOR CLOTS CONSISTENT WITH PREVIOUS RESULT   CBC     Status: Abnormal   Collection Time: 11/09/16  2:23 AM  Result Value Ref Range   WBC 9.8 4.0 - 10.5 K/uL   RBC 3.42 (L) 4.22 - 5.81 MIL/uL   Hemoglobin 9.3 (L) 13.0 - 17.0 g/dL   HCT 30.6 (L) 39.0 - 52.0 %   MCV 89.5 78.0 - 100.0 fL   MCH 27.2 26.0 - 34.0 pg   MCHC  30.4 30.0 - 36.0 g/dL   RDW 18.4 (H) 11.5 - 15.5 %   Platelets 91 (L) 150 - 400 K/uL    Comment: REPEATED TO VERIFY SPECIMEN CHECKED FOR CLOTS CONSISTENT WITH PREVIOUS RESULT   Basic metabolic panel     Status: Abnormal   Collection Time: 11/09/16  2:23 AM  Result Value Ref Range   Sodium 139 135 - 145 mmol/L   Potassium 3.7 3.5 - 5.1 mmol/L    Comment: DELTA CHECK NOTED   Chloride 98 (L) 101 - 111 mmol/L   CO2 33 (H) 22 - 32 mmol/L   Glucose, Bld 80 65 - 99 mg/dL   BUN 23 (H) 6 - 20 mg/dL   Creatinine, Ser 1.24 0.61 - 1.24 mg/dL   Calcium 8.1 (L) 8.9 - 10.3 mg/dL   GFR calc non Af Amer 52 (L) >60 mL/min   GFR calc Af Amer >60 >60 mL/min    Comment: (NOTE) The eGFR has been calculated using the CKD EPI equation. This calculation has not been validated in all clinical situations. eGFR's persistently <60 mL/min signify possible Chronic Kidney Disease.  Anion gap 8 5 - 15  Glucose, capillary     Status: None   Collection Time: 11/09/16  7:24 AM  Result Value Ref Range   Glucose-Capillary 76 65 - 99 mg/dL    Dg Chest Port 1 View  Result Date: 11/09/2016 CLINICAL DATA:  Shortness of breath history of COPD, CHF, aortic stenosis. EXAM: PORTABLE CHEST 1 VIEW COMPARISON:  Portable chest x-ray of November 07, 2016 FINDINGS: The patient is positioned in a lordotic matter similar to yesterday's study. The left lung is well-expanded. The interstitial markings remain increased. On the right there is further volume loss consistent with increased pleural effusion. Confluent density in the inferior aspect of the aerated right lung persists. The cardiac silhouette is enlarged. The pulmonary vascularity is mildly engorged. There is calcification in the wall of the aortic arch. The mediastinum is normal in width. IMPRESSION: Increased volume of pleural fluid on the right since a study of 2 days ago. CHF with pulmonary interstitial edema. COPD with persistent right lower lobe atelectasis or  pneumonia. Electronically Signed   By: David  Martinique M.D.   On: 11/09/2016 08:28   Dg Chest Port 1 View  Result Date: 11/07/2016 CLINICAL DATA:  Worsening cough and shortness of breath. EXAM: PORTABLE CHEST 1 VIEW COMPARISON:  Single-view of the chest 05/28/2016 and 11/28/2015. FINDINGS: There is interstitial pulmonary edema. Moderate right pleural effusion with associated basilar airspace disease is noted. Port-A-Cath is in place. Heart size is upper normal. No pneumothorax. IMPRESSION: Interstitial pulmonary edema with a moderate right pleural effusion and basilar airspace disease which could be atelectasis or pneumonia. Electronically Signed   By: Inge Rise M.D.   On: 11/07/2016 15:08    ROS Blood pressure (!) 89/47, pulse 72, temperature (!) 100.9 F (38.3 C), temperature source Axillary, resp. rate 20, height '5\' 8"'  (1.727 m), weight 194 lb 10.7 oz (88.3 kg), SpO2 96 %. Physical Exam Alert.   . Very restless this morning. . Oral mucosa is moist.   . Sclera anicteric, conjunctivae is pink. Thyroid not enlarged. No cervical lymphadenopathy. LungsL: crackles heard. Marland Kitchen Heart regular irregular..  Abdomen is soft. Bowel sounds are positive. No hepatomegaly. No abdominal masses felt. No tenderness.  No edema to lower extremities.   Assessment/Plan: Anemia. Melena. Will discuss with Dr. Laural Golden.     Jolissa Kapral W 11/09/2016, 8:57 AM

## 2016-11-09 NOTE — Evaluation (Signed)
Clinical/Bedside Swallow Evaluation Patient Details  Name: [name] MRN: [id number] Date of Birth: 01/31/1931  Today's Date: 11/09/2016 Time: SLP Start Time (ACUTE ONLY): [id number] SLP Stop Time (ACUTE ONLY): 1552 SLP Time Calculation (min) (ACUTE ONLY): 19 min  Past Medical History:  Past Medical History:  Diagnosis Date  . Anemia   . Aortic stenosis, moderate 05/21/2015  . Asthma   . B12 deficiency 07/28/2016  . Cellulitis of leg, left 01/03/2015  . Cervical spondylosis without myelopathy   . Cholelithiasis   . Chronic atrial fibrillation ([place])   . Chronic diastolic heart failure ([place])   . Chronic lower back pain   . Cirrhosis ([place])    Questionable, AFP normal Feb 01/2012, hx of ETOH use   . COPD (chronic obstructive pulmonary disease) (HCC)    Uses occasional nighttime O2  . Diffuse large B cell lymphoma ([place]) 08/22/2015  . Disseminated herpes zoster 2010  . DNR (do not resuscitate) 11/01/2015  . Dysrhythmia    chronic AFib  . Essential hypertension, benign   . GERD (gastroesophageal reflux disease)   . Headache(784.0)   . History of chicken pox 1941; 2011  . Iron deficiency anemia 01/18/2012  . Lupus anticoagulant positive   . Mixed hyperlipidemia   . Pulmonary embolus ([place])    2003 and 2011  . Pulmonary nodules    Chest CT 09/11  . Rectal ulcer 12/01/2015  . Rheumatoid arthritis(714.0)   . Seizure disorder ([place]) 09/2015  . Stage III chronic kidney disease   . 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 BACK SURGERY    . CATARACT EXTRACTION W/ INTRAOCULAR LENS  IMPLANT, BILATERAL    . CHOLECYSTECTOMY  05/23/2012   Procedure: LAPAROSCOPIC CHOLECYSTECTOMY WITH INTRAOPERATIVE CHOLANGIOGRAM;  Surgeon: Stark Klein, MD;  Location: [facility];  Service: General;  Laterality: N/A;  . COLONOSCOPY  01/24/2013   [id number] polyps/colonic diverticulosis  . COLONOSCOPY N/A 12/01/2015   Procedure:  COLONOSCOPY;  Surgeon: Danie Binder, MD;  [facility];  Service: Endoscopy;  Laterality: N/A;  . ESOPHAGOGASTRODUODENOSCOPY  02/02/12   [id number] size hiatal hernia; otherwise normal exam  . ESOPHAGOGASTRODUODENOSCOPY N/A 11/29/2015   [id number] diverticulosis/small internal hemorrhoids/left colon is redundant  . LYMPH NODE BIOPSY    . LYMPH NODE BIOPSY Right 09/11/2015   Procedure: RIGHT INGUINAL LYMPH NODE BIOPSY;  Surgeon: Aviva Signs, MD;  Location: [facility];  Service: General;  Laterality: Right;  . MYRINGOTOMY     "3 times; both ears"  . POLYPECTOMY  02/02/2012   RMR: Multiple colonic polyps removed, flat, tubular adenomas/ Left-sided diverticulosis  . PORTACATH PLACEMENT    . PORTACATH PLACEMENT Left 09/11/2015   Procedure: INSERTION PORT-A-CATH;  Surgeon: Aviva Signs, MD;  Location: [facility];  Service: General;  Laterality: Left;  . POSTERIOR LUMBAR VERTEBRAE EXCISION  2003; 2009; 2011   HPI:  82 y.o.malewith medical history significant ofchronic iron deficiency anemia, telangiectasia of antrum and jejunum,paroxysmal atrial fibrillation on Xarelto, chronic diastolic heart failure, 123456 deficiency, COPD, diffuse large B cell lymphoma, HX of disseminated herpes zoster, essential hypertension, hyperlipidemia, GERD, lupus anticoagulant, rheumatoid arthritis, pulmonary embolism in 2000 03/04/2010, seizure disorder, stage III chronic kidney disease who comes to the emergency department with complaints of progressively worse fatigue, dyspnea and back pain. He has had several episodes of melena recently. Chest x ray significnat for Increased volume of pleural fluid on the right since a study of  2 days ago. CHF with pulmonary interstitial edema. COPD with persistent right lower lobe atelectasis or pneumonia   Assessment / Plan / Recommendation Clinical Impression  Pt presents with a suspected moderate to severe pharyngeal dysphagia.  Prior to PO trials pt with labored breathing, O2  saturation 99, however respiratory rate fluctuated from upper 20s to mid 40s. Pt restless and noted to have a weak congested cough.  Signs and symptoms of decreased airway protection evidenced during all isolated PO trials (ice chips, thin liquids, and puree) including immediate and delayed coughing, intermittent wet vocal quality  and change in HR and RR. Recommend MBS prior to diet initiation given current concern of right lobe infiltrate, reduced respiratory support, patient reduced mentation, bed bound status, and symptomatic bedside swallow evalutation. MBS to be attempted tomorrow pending stability of pt vitals as HR and RR were greatly elevated throughout swallow evaluation. RN and MD aware.     Aspiration Risk  Severe aspiration risk;Risk for inadequate nutrition/hydration    Diet Recommendation   NPO   Medication Administration: Via alternative means    Other  Recommendations Oral Care Recommendations: Oral care QID   Follow up Recommendations 24 hour supervision/assistance      Frequency and Duration min 2x/week  1 week       Prognosis Prognosis for Safe Diet Advancement: Fair Barriers to Reach Goals: Severity of deficits;Time post onset      Swallow Study   General Date of Onset: 11/07/16 HPI: 82 y.o.malewith medical history significant ofchronic iron deficiency anemia, telangiectasia of antrum and jejunum,paroxysmal atrial fibrillation on Xarelto, chronic diastolic heart failure, 123456 deficiency, COPD, diffuse large B cell lymphoma, HX of disseminated herpes zoster, essential hypertension, hyperlipidemia, GERD, lupus anticoagulant, rheumatoid arthritis, pulmonary embolism in 2000 03/04/2010, seizure disorder, stage III chronic kidney disease who comes to the emergency department with complaints of progressively worse fatigue, dyspnea and back pain. He has had several episodes of melena recently. Chest x ray significnat for Increased volume of pleural fluid on the right since  a study of 2 days ago. CHF with pulmonary interstitial edema. COPD with persistent right lower lobe atelectasis or pneumonia Type of Study: Bedside Swallow Evaluation Previous Swallow Assessment: none on file Diet Prior to this Study: NPO Temperature Spikes Noted: Yes Respiratory Status: Other (comment) (high flow nasal cannula) History of Recent Intubation: No Behavior/Cognition: Alert;Other (Comment) (restless ) Oral Cavity Assessment: Dry Oral Care Completed by SLP: Yes Oral Cavity - Dentition: Edentulous Self-Feeding Abilities: Needs assist (secondary to UE restraints) Patient Positioning: Upright in bed Baseline Vocal Quality: Breathy;Low vocal intensity Volitional Cough: Congested;Weak Volitional Swallow: Able to elicit    Oral/Motor/Sensory Function Overall Oral Motor/Sensory Function: Within functional limits   Ice Chips Ice chips: Impaired Presentation: Spoon Oral Phase Impairments: Reduced lingual movement/coordination Oral Phase Functional Implications: Prolonged oral transit Pharyngeal Phase Impairments: Suspected delayed Swallow;Multiple swallows;Cough - Immediate;Cough - Delayed;Change in Vital Signs   Thin Liquid Thin Liquid: Impaired Presentation: Cup Oral Phase Impairments: Reduced lingual movement/coordination Oral Phase Functional Implications: Prolonged oral transit Pharyngeal  Phase Impairments: Suspected delayed Swallow;Multiple swallows;Cough - Immediate;Cough - Delayed;Change in Vital Signs    Nectar Thick Nectar Thick Liquid: Not tested   Honey Thick Honey Thick Liquid: Not tested   Puree Puree: Impaired Presentation: Spoon Oral Phase Impairments: Reduced lingual movement/coordination Oral Phase Functional Implications: Prolonged oral transit Pharyngeal Phase Impairments: Suspected delayed Swallow;Multiple swallows;Cough - Immediate;Cough - Delayed;Change in Vital Signs   Solid   GO   Solid: Not tested  Arvil Chaco MA, CCC-SLP Acute Care  Speech Language Pathologist    Arvil Chaco E 11/09/2016,4:25 PM

## 2016-11-09 NOTE — Progress Notes (Signed)
Pt is resting comfortably and has been in no obvious or stated distress since addition of medication infusion (E-Link order) Heart rate and vitals are notably improved and recorded.

## 2016-11-09 NOTE — Progress Notes (Signed)
Pharmacy Antibiotic Note  [name] is a 82 y.o. male admitted on 11/07/2016 with aspiration pna.  Pharmacy has been consulted for UNASYN dosing and KEPPRA IV dosing while NPO.  Plan: Unasyn 1.5gm IV q6hrs Monitor labs, progress, c/s Keppra 500mg  IV q12hrs (while NPO)  Height: 5\' 8"  (172.7 cm) Weight: 194 lb 10.7 oz (88.3 kg) IBW/kg (Calculated) : 68.4  Temp (24hrs), Avg:99.6 F (37.6 C), Min:98.7 F (37.1 C), Max:100.9 F (38.3 C)   Recent Labs Lab 11/07/16 1440 11/08/16 0417 11/08/16 1029 11/08/16 1758 11/09/16 0223  WBC 8.1 5.9 6.8 10.5 9.8  CREATININE 1.36* 1.15  --   --  1.24    Estimated Creatinine Clearance: 48.8 mL/min (by C-G formula based on SCr of 1.24 mg/dL).    Allergies  Allergen Reactions  . Cymbalta [Duloxetine Hcl] Other (See Comments)    Confusion   . Procaine Hcl Hives and Other (See Comments)    NOVOCAINE: Sweating, Confusion, Not in right state of mind.  [name]    unknown unknown   Prescriptions Prior to Admission  Medication Sig Dispense Refill Last Dose  . albuterol (PROAIR HFA) 108 (90 BASE) MCG/ACT inhaler Inhale 2 puffs into the lungs every 6 (six) hours as needed. Shortness of Breath 1 Inhaler 3 11/06/2016 at Unknown time  . allopurinol (ZYLOPRIM) 300 MG tablet Take 300 mg by mouth daily.  3 11/06/2016 at Unknown time  . cetirizine (ZYRTEC) 10 MG tablet Take 10 mg by mouth daily.     11/06/2016 at Unknown time  . DIGOX 125 MCG tablet TAKE ONE TABLET BY MOUTH DAILY 15 tablet 0 11/06/2016 at Unknown time  . diltiazem (CARDIZEM) 120 MG tablet Take 120 mg by mouth daily.  3 11/06/2016 at Unknown time  . diphenhydrAMINE (BENADRYL) 25 MG tablet Take 25 mg by mouth every 6 (six) hours as needed for itching or allergies.   11/07/2016 at Unknown time  . ferrous sulfate 325 (65 FE) MG tablet Take 325 mg by mouth daily.   11/06/2016 at Unknown time  . fluticasone (FLONASE) 50 MCG/ACT nasal spray Place 2  sprays into both nostrils daily.   0 11/06/2016 at Unknown time  . folic acid (FOLVITE) 1 MG tablet Take 1 tablet (1 mg total) by mouth daily. 30 tablet 11 11/06/2016 at Unknown time  . furosemide (LASIX) 40 MG tablet Take 1 tablet (40 mg total) by mouth daily as needed for fluid (For fluid buildup in your lungs and legs.). Take daily for prevention of fluid buildup.   11/06/2016 at Unknown time  . gabapentin (NEURONTIN) 300 MG capsule Take 1 capsule (300 mg total) by mouth 2 (two) times daily.   11/06/2016 at Unknown time  . ipratropium-albuterol (DUONEB) 0.5-2.5 (3) MG/3ML SOLN Take 3 mLs by nebulization every 6 (six) hours as needed. (Patient taking differently: Take 3 mLs by nebulization every 6 (six) hours as needed (shortness of breath). ) 360 mL 1 11/06/2016 at Unknown time  . levETIRAcetam (KEPPRA) 500 MG tablet Take 1 tablet (500 mg total) by mouth 2 (two) times daily. 60 tablet 0 11/07/2016 at Unknown time  . methocarbamol (ROBAXIN) 500 MG tablet Take 1 tablet (500 mg total) by mouth every 8 (eight) hours as needed for muscle spasms. 30 tablet 0 11/06/2016 at Unknown time  . Omega-3 Fatty Acids (FISH OIL) 1000 MG CAPS Take 2 capsules by mouth 2 (two) times daily.    11/06/2016 at Unknown time  . omeprazole (PRILOSEC)  40 MG capsule Take 1 capsule (40 mg total) by mouth 2 (two) times daily. 60 capsule 3 11/06/2016 at Unknown time  . Oxycodone HCl 10 MG TABS Take 1 tablet (10 mg total) by mouth every 6 (six) hours as needed. 120 tablet 0 11/07/2016 at Unknown time  . potassium chloride SA (K-DUR,KLOR-CON) 20 MEQ tablet Take 0.5 tablets (10 mEq total) by mouth daily as needed (Take as needed when you take Lasix (furosemide).). Starting 08/27/15.   11/06/2016 at Unknown time  . predniSONE (DELTASONE) 10 MG tablet Take 10 mg by mouth daily with breakfast.   11/06/2016 at Unknown time  . rivaroxaban (XARELTO) 20 MG TABS tablet Take 20 mg by mouth daily with supper.   11/06/2016 at 0830  . tamsulosin  (FLOMAX) 0.4 MG CAPS capsule Take 1 capsule (0.4 mg total) by mouth at bedtime. For prostate treatment. 30 capsule 3 11/06/2016 at Unknown time  . lidocaine-prilocaine (EMLA) cream Apply a quarter size amount to port site 1 hour prior to chemo. Do not rub in. Cover with plastic wrap. 30 g 3 Taking  . tadalafil (CIALIS) 5 MG tablet Take 5 mg by mouth daily as needed for erectile dysfunction.    unknown  . Vitamin D, Ergocalciferol, (DRISDOL) 50000 UNITS CAPS capsule Take 1 capsule by mouth once a week. Reported on 07/05/2016  4 11/04/2016   Antimicrobials this admission: Unasyn 11/15 >>   Dose adjustments this admission:  Microbiology results:  BCx:   UCx:    Sputum:    MRSA PCR: negative  Thank you for allowing pharmacy to be a part of this patient's care.  Hart Robinsons A 11/09/2016 1:22 PM

## 2016-11-09 NOTE — Progress Notes (Signed)
[facility] Progress Note Patient Name: [name] DOB: 1931/02/03 MRN: [id number]   Date of Service  11/09/2016  HPI/Events of Note  Worsening agitation   precedex gtt     Intervention Category Major Interventions: Delirium, psychosis, severe agitation - evaluation and management  [name] 11/09/2016, 2:52 AM

## 2016-11-09 NOTE — Progress Notes (Signed)
PROGRESS NOTE                                                                                                                                                                                                             Patient Demographics:    [name], is a 82 y.o. male, DOB - 02-23-1931, [id number]  Admit date - 11/07/2016   Admitting Physician Reubin Milan, MD  Outpatient Primary MD for the patient is [name], MD  LOS - 2  Chief Complaint  Patient presents with  . Shortness of Breath       Brief Narrative   DEMITRIS NIHILL is a 82 y.o. male with medical history significant of chronic iron deficiency anemia, telangiectasia of antrum and jejunum,  paroxysmal atrial fibrillation on Xarelto, chronic diastolic heart failure, 123456 deficiency, COPD, diffuse large B cell lymphoma, HX of disseminated herpes zoster, essential hypertension, hyperlipidemia, GERD, lupus anticoagulant, rheumatoid arthritis, pulmonary embolism > 1 yr ago, was admitted for generalized weakness, A. fib RVR and was found to have severe anemia with hemoglobin of 5.1. His fecal occult blood was positive. He received 2 units of packed RBC transfusion, became quite delirious and confused, currently cardiology is following him for A. fib, GI has been called. We'll also discuss the case with his hematologist.   Subjective:    [name] today In bed, confused, unreliable historian but says he is in no distress. Denies headache chest or abdominal pain.   Assessment  & Plan :     1.Severe symptomatic iron deficiency anemia with history of chronic iron deficiency anemia, telangiectasia of antrum and jejunum,  paroxysmal atrial fibrillation on Xarelto - was Hemoccult positive, received 2 units of packed RBC with stable H&H, continue Zantac, xaralto has been discontinued by cardiology. Agree with it. Monitor H&H. We'll also discuss the case  with patient's hematologist as he has underlying lymphoma as well. Continue oral iron supplement. GI has been requested to assist as well.  2. A. fib with RVR. Chronic A. fib, [place] vasc 2 score of greater than 4. Currently on digoxin and Cardizem drip, cardiology following deferred to cardiology, anti-coag remission held due to recurrent anemia from #1 above. Risks outweigh benefits.  3. Acute on chronic diastolic CHF. EF 55-60%. Diurese with IV Lasix and monitor.  4. Low-grade temperature morning of  11/09/2016, right lower lobe infiltrate. In the setting of delirium he has likely aspirated. Nothing by mouth except medications, speech eval, Unasyn for now. If temperature rises over 100.4 Will check cultures.  5. Moderate to severe aortic stenosis. For now have to diurese will continue to monitor. Cardiology on board.  6. BPH. Flomax resumed.  7. History of seizures. For now IV Keppra as he is nothing by mouth.  8. COPD. Continue supportive care and no wheezing or acute issues.  9. Delirium. Avoid benzodiazepines, minimize narcotics, Haldol and Seroquel. Center bedside. Fall and aspiration precautions.    Family Communication  :  None  Code Status :  DNR  Diet : Diet NPO time specified Except for: Sips with Meds    Disposition Plan  :  Step down  Consults  :  Cards, GI, Hame ( Tom Keflas over the phone)  Procedures  :     DVT Prophylaxis  :  SCDs    Lab Results  Component Value Date   PLT 91 (L) 11/09/2016    Inpatient Medications  Scheduled Meds: . digoxin  0.25 mg Intravenous Daily  . diltiazem  60 mg Oral Q8H  . famotidine (PEPCID) IV  20 mg Intravenous Q12H  . ferrous sulfate  325 mg Oral Daily  . furosemide  20 mg Intravenous Once  . furosemide  60 mg Intravenous BID  . ipratropium  0.5 mg Nebulization TID  . levalbuterol  0.63 mg Nebulization TID  . QUEtiapine  25 mg Oral BID  . sodium chloride flush  3 mL Intravenous Q12H  . tamsulosin  0.4 mg Oral QHS    Continuous Infusions: PRN Meds:.haloperidol lactate, levalbuterol, methocarbamol  Antibiotics  :    Anti-infectives    None         Objective:   Vitals:   11/09/16 0600 11/09/16 0700 11/09/16 0725 11/09/16 0851  BP: (!) 105/54 (!) 89/47    Pulse: (!) 103 (!) 114 72   Resp: (!) 22 (!) 28 20   Temp:   (!) 100.9 F (38.3 C)   TempSrc:   Axillary   SpO2: 100% (!) 81% 94% 96%  Weight:      Height:        Wt Readings from Last 3 Encounters:  11/09/16 88.3 kg (194 lb 10.7 oz)  09/15/16 88 kg (194 lb 1.6 oz)  07/26/16 91.5 kg (201 lb 12.8 oz)     Intake/Output Summary (Last 24 hours) at 11/09/16 0918 Last data filed at 11/09/16 0815  Gross per 24 hour  Intake           942.79 ml  Output              700 ml  Net           242.79 ml     Physical Exam  Awake , confused, No new F.N deficits, Normal affect [place].AT,PERRAL Supple Neck,No JVD, No cervical lymphadenopathy appriciated.  Symmetrical Chest wall movement, Good air movement bilaterally, CTAB iRRR,No Gallops,Rubs, Positive aortic systolic murmur, No Parasternal Heave +ve B.Sounds, Abd Soft, No tenderness, No organomegaly appriciated, No rebound - guarding or rigidity. No Cyanosis, Clubbing or edema, No new Rash or bruise       Data Review:    CBC  Recent Labs Lab 11/07/16 1440 11/07/16 2122 11/08/16 0417 11/08/16 1029 11/08/16 1758 11/09/16 0223  WBC 8.1  --  5.9 6.8 10.5 9.8  HGB 5.1* 4.9* 7.4* 7.5* 9.1* 9.3*  HCT 18.8*  17.3* 24.9* 25.3* 30.1* 30.6*  PLT 123*  --  91* 92* 95* 91*  MCV 98.4  --  90.9 90.7 89.1 89.5  MCH 26.7  --  27.0 26.9 26.9 27.2  MCHC 27.1*  --  29.7* 29.6* 30.2 30.4  RDW 16.1*  --  19.3* 19.4* 18.9* 18.4*  LYMPHSABS 0.4*  --  0.3*  --   --   --   MONOABS 0.6  --  0.3  --   --   --   EOSABS 0.0  --  0.0  --   --   --   BASOSABS 0.0  --  0.0  --   --   --     Chemistries   Recent Labs Lab 11/07/16 1440 11/08/16 0417 11/09/16 0223  NA 141 140 139  K 4.4 4.5 3.7   CL 107 107 98*  CO2 30 27 33*  GLUCOSE 116* 132* 80  BUN 18 20 23*  CREATININE 1.36* 1.15 1.24  CALCIUM 7.7* 8.0* 8.1*  MG 2.2  --   --   AST  --  26  --   ALT  --  13*  --   ALKPHOS  --  70  --   BILITOT  --  3.4*  --    ------------------------------------------------------------------------------------------------------------------ No results for input(s): CHOL, HDL, LDLCALC, TRIG, CHOLHDL, LDLDIRECT in the last 72 hours.  No results found for: HGBA1C ------------------------------------------------------------------------------------------------------------------ No results for input(s): TSH, T4TOTAL, T3FREE, THYROIDAB in the last 72 hours.  Invalid input(s): FREET3 ------------------------------------------------------------------------------------------------------------------ No results for input(s): VITAMINB12, FOLATE, FERRITIN, TIBC, IRON, RETICCTPCT in the last 72 hours.  Coagulation profile No results for input(s): INR, PROTIME in the last 168 hours.  No results for input(s): DDIMER in the last 72 hours.  Cardiac Enzymes  Recent Labs Lab 11/07/16 1440 11/07/16 2122 11/08/16 0417  TROPONINI 0.06* 0.06* 0.06*   ------------------------------------------------------------------------------------------------------------------    Component Value Date/Time   BNP 619.0 (H) 11/07/2016 1440    Micro Results Recent Results (from the past 240 hour(s))  MRSA PCR Screening     Status: None   Collection Time: 11/07/16  8:25 PM  Result Value Ref Range Status   MRSA by PCR NEGATIVE NEGATIVE Final    Comment:        The GeneXpert MRSA Assay (FDA approved for NASAL specimens only), is one component of a comprehensive MRSA colonization surveillance program. It is not intended to diagnose MRSA infection nor to guide or monitor treatment for MRSA infections.     Radiology Reports Dg Chest Port 1 View  Result Date: 11/09/2016 CLINICAL DATA:  Shortness of  breath history of COPD, CHF, aortic stenosis. EXAM: PORTABLE CHEST 1 VIEW COMPARISON:  Portable chest x-ray of November 07, 2016 FINDINGS: The patient is positioned in a lordotic matter similar to yesterday's study. The left lung is well-expanded. The interstitial markings remain increased. On the right there is further volume loss consistent with increased pleural effusion. Confluent density in the inferior aspect of the aerated right lung persists. The cardiac silhouette is enlarged. The pulmonary vascularity is mildly engorged. There is calcification in the wall of the aortic arch. The mediastinum is normal in width. IMPRESSION: Increased volume of pleural fluid on the right since a study of 2 days ago. CHF with pulmonary interstitial edema. COPD with persistent right lower lobe atelectasis or pneumonia. Electronically Signed   By: David  Martinique M.D.   On: 11/09/2016 08:28   Dg Chest Port 1 View  Result Date:  11/07/2016 CLINICAL DATA:  Worsening cough and shortness of breath. EXAM: PORTABLE CHEST 1 VIEW COMPARISON:  Single-view of the chest 05/28/2016 and 11/28/2015. FINDINGS: There is interstitial pulmonary edema. Moderate right pleural effusion with associated basilar airspace disease is noted. Port-A-Cath is in place. Heart size is upper normal. No pneumothorax. IMPRESSION: Interstitial pulmonary edema with a moderate right pleural effusion and basilar airspace disease which could be atelectasis or pneumonia. Electronically Signed   By: Inge Rise M.D.   On: 11/07/2016 15:08    Time Spent in minutes  30   Isabel Freese K M.D on 11/09/2016 at 9:18 AM  Between 7am to 7pm - Pager - [phone]  After 7pm go to [email or web address] - password [facility]  [facility] -  Office  [phone]

## 2016-11-09 NOTE — Progress Notes (Signed)
eLink Physician-Brief Progress Note Patient Name: [name] DOB: 02-01-1931 MRN: [id number]   Date of Service  11/09/2016  HPI/Events of Note  Moderated agitation, already got haldol and previously ativan  eICU Interventions  Versed 2mg  x 1 dose.      Intervention Category Major Interventions: Change in mental status - evaluation and management;Delirium, psychosis, severe agitation - evaluation and management  Kegan Shepardson 11/09/2016, 2:25 AM

## 2016-11-09 NOTE — Progress Notes (Signed)
eLink Physician-Brief Progress Note Patient Name: [name] DOB: 1931-05-08 MRN: [id number]   Date of Service  11/09/2016  HPI/Events of Note  afib with HR in the 130-160 range, BP 118/64, pt with AMS and not adequate to take PO at this time  eICU Interventions  cardizem 20mg  IV x 1     Intervention Category Major Interventions: Arrhythmia - evaluation and management  [name] 11/09/2016, 12:43 AM

## 2016-11-09 NOTE — Progress Notes (Signed)
Patient Name: [name] Date of Encounter: 11/09/2016  Primary Cardiologist: Dr. Jesse Sans Problem List     Principal Problem:   Atrial fibrillation with rapid ventricular response [facility]) Active Problems:   Essential hypertension, benign   COPD (chronic obstructive pulmonary disease) (HCC)   Stage III chronic kidney disease   Acute on chronic diastolic CHF (congestive heart failure) (HCC)   Aortic stenosis, moderate   BPH (benign prostatic hyperplasia)   Telangiectasia   Symptomatic anemia     Subjective   Agitated  Inpatient Medications    Scheduled Meds: . digoxin  0.25 mg Intravenous Daily  . diltiazem  30 mg Oral Q8H  . famotidine (PEPCID) IV  20 mg Intravenous Q12H  . furosemide  20 mg Intravenous Once  . furosemide  40 mg Intravenous BID  . ipratropium  0.5 mg Nebulization TID  . levalbuterol  0.63 mg Nebulization TID  . sodium chloride flush  3 mL Intravenous Q12H   Continuous Infusions: . dexmedetomidine 0.5 mcg/kg/hr (11/09/16 0600)   PRN Meds: HYDROmorphone (DILAUDID) injection, levalbuterol   Vital Signs    Vitals:   11/09/16 0500 11/09/16 0600 11/09/16 0700 11/09/16 0725  BP: 102/61 (!) 105/54 (!) 89/47   Pulse: (!) 113 (!) 103 (!) 114 72  Resp: 17 (!) 22 (!) 28 20  Temp:    (!) 100.9 F (38.3 C)  TempSrc:    Axillary  SpO2: 99% 100% (!) 81% 94%  Weight: 194 lb 10.7 oz (88.3 kg)     Height:        Intake/Output Summary (Last 24 hours) at 11/09/16 0742 Last data filed at 11/09/16 0600  Gross per 24 hour  Intake           942.79 ml  Output             1400 ml  Net          -457.21 ml   Filed Weights   11/07/16 1932 11/09/16 0500  Weight: 203 lb 14.8 oz (92.5 kg) 194 lb 10.7 oz (88.3 kg)    Physical Exam   GEN: Well nourished, well developed agitated.  Neck: Supple, no JVD, carotid bruits, or masses. Cardiac: irreg irreg, 3/6 harsh systolic murmur LSB,no rubs, or gallops. No clubbing, cyanosis, edema.  Radials/DP/PT  2+ and equal bilaterally.  Respiratory:  Rales and rhonchi bilaterally. GI: Soft, nontender, nondistended, BS + x 4. MS: no deformity or atrophy. Skin: warm and dry, no rash. Psych: Confused and agitated. Trying to get out of bed.  Labs    CBC  Recent Labs  11/07/16 1440  11/08/16 0417  11/08/16 1758 11/09/16 0223  WBC 8.1  --  5.9  < > 10.5 9.8  NEUTROABS 7.0  --  5.3  --   --   --   HGB 5.1*  < > 7.4*  < > 9.1* 9.3*  HCT 18.8*  < > 24.9*  < > 30.1* 30.6*  MCV 98.4  --  90.9  < > 89.1 89.5  PLT 123*  --  91*  < > 95* 91*  < > = values in this interval not displayed. Basic Metabolic Panel  Recent Labs  11/07/16 1440 11/08/16 0417 11/09/16 0223  NA 141 140 139  K 4.4 4.5 3.7  CL 107 107 98*  CO2 30 27 33*  GLUCOSE 116* 132* 80  BUN 18 20 23*  CREATININE 1.36* 1.15 1.24  CALCIUM 7.7* 8.0* 8.1*  MG 2.2  --   --  Liver Function Tests  Recent Labs  11/08/16 0417  AST 26  ALT 13*  ALKPHOS 70  BILITOT 3.4*  PROT 6.2*  ALBUMIN 3.0*   No results for input(s): LIPASE, AMYLASE in the last 72 hours. Cardiac Enzymes  Recent Labs  11/07/16 1440 11/07/16 2122 11/08/16 0417  TROPONINI 0.06* 0.06* 0.06*   BNP Invalid input(s): POCBNP D-Dimer No results for input(s): DDIMER in the last 72 hours. Hemoglobin A1C No results for input(s): HGBA1C in the last 72 hours. Fasting Lipid Panel No results for input(s): CHOL, HDL, LDLCALC, TRIG, CHOLHDL, LDLDIRECT in the last 72 hours. Thyroid Function Tests No results for input(s): TSH, T4TOTAL, T3FREE, THYROIDAB in the last 72 hours.  Invalid input(s): FREET3  Telemetry    Afib at 110-120/m- Personally Reviewed  ECG    Radiology    Dg Chest Port 1 View  Result Date: 11/07/2016 CLINICAL DATA:  Worsening cough and shortness of breath. EXAM: PORTABLE CHEST 1 VIEW COMPARISON:  Single-view of the chest 05/28/2016 and 11/28/2015. FINDINGS: There is interstitial pulmonary edema. Moderate right pleural effusion  with associated basilar airspace disease is noted. Port-A-Cath is in place. Heart size is upper normal. No pneumothorax. IMPRESSION: Interstitial pulmonary edema with a moderate right pleural effusion and basilar airspace disease which could be atelectasis or pneumonia. Electronically Signed   By: Inge Rise M.D.   On: 11/07/2016 15:08    Cardiac Studies  Echo 04/2015 Impressions:   - Mild LVH with LVEF 65-70%, indeterminate diastolic function.   Moderate left atrial enlargement. Moderate to severe calcific   aortic stenosis as outlined above - there has been progression   compared to the prior study. Trivial tricuspid regurgitation,   unable to assess PASP.    Patient Profile     Chronically ill male with chronic afib and moderate AS. CML with recurrent Right pleural effusion. Exam with marked decreased BS right mid/base  AS murmur and telemetry with afib rates 110-125.  Dyspnea from profound anemia COPD and recurrent large right pleural effusion. Last thoracentesis not Diagnostic for CML involvement but suspicious with non monoclonal lymphocytes  Assessment & Plan    1. Atrial fibrillation with RVR: Known history of same but had been rate controlled in June 2017. Multifactorial reasons for rapid heart rate, these include severe anemia, abdominal pain, chronic illness.  Xarelto stopped. With ongoing need for blood transfusions and patient's self-reported melena  this risk is too high and outweighed by the benefit of CVA protection. Not on IV diltiazem for rate control. Takes 120 mg daily at home but on 30 mg q 8 here. Will increase to 60 mg q 8      2. Acute pulmonary edema: Likely related to profound anemia. Most recent echocardiogram revealed normal LV systolic function. IV Lasix 40 mg twice a day. I/o's negative 457. Repeat echo pending   3. Severe aortic valve stenosis: With multiple core morbidities, not certain that he would be a candidate or aortic valve repair at this  time, could possibly be revisited once he has stabilized. This had been discussed in the past with the patient by Dr. Domenic Polite, this did include discussion of TAVR. We'll defer any more discussion concerning aortic valve repair until heart rate is controlled and bleeding has stopped.   4. History of non-Hodgkin's lymphoma: Per daughter he is in remission but now has evidence of cancer in spinal fluid and possibly brain. 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 He was  not found to be a candidate for IT therapy, and has been transitioned to palliative care. Defer to oncology for further workup and treatment plan.   5. Chronic iron deficiency anemia: On iron replacement, and has been receiving periodic blood transfusions through hematology/oncology.        Signed, Ermalinda Barrios, PA-C  11/09/2016, 7:42 AM   Patient examined chart reviewed agree with increasing cardizem dose. CHF in setting AS and anemia. Exam with AS murmur basilar crackles Recommend oncology to  See and repeat right thoracentesis Xarelto held due to anemia and need for transfusion  Jenkins Rouge

## 2016-11-09 NOTE — Progress Notes (Signed)
SLP Cancellation Note  Patient Details Name: [name] MRN: [id number] DOB: November 01, 1931   Cancelled treatment:       Reason Eval/Treat Not Completed: Other (comment); RN preparing to clean patient at this time. Will return later today for BSE.  Thank you,  Genene Churn, [place]    [place] 11/09/2016, 1:45 PM

## 2016-11-10 ENCOUNTER — Inpatient Hospital Stay (HOSPITAL_COMMUNITY): Payer: Medicare Other

## 2016-11-10 ENCOUNTER — Other Ambulatory Visit (HOSPITAL_COMMUNITY): Payer: Medicare Other

## 2016-11-10 DIAGNOSIS — J9611 Chronic respiratory failure with hypoxia: Secondary | ICD-10-CM

## 2016-11-10 DIAGNOSIS — Z66 Do not resuscitate: Secondary | ICD-10-CM

## 2016-11-10 DIAGNOSIS — J90 Pleural effusion, not elsewhere classified: Secondary | ICD-10-CM

## 2016-11-10 DIAGNOSIS — K921 Melena: Secondary | ICD-10-CM

## 2016-11-10 DIAGNOSIS — I5033 Acute on chronic diastolic (congestive) heart failure: Secondary | ICD-10-CM

## 2016-11-10 LAB — BASIC METABOLIC PANEL
Anion gap: 9 (ref 5–15)
BUN: 20 mg/dL (ref 6–20)
CALCIUM: 8 mg/dL — AB (ref 8.9–10.3)
CO2: 35 mmol/L — AB (ref 22–32)
CREATININE: 1.15 mg/dL (ref 0.61–1.24)
Chloride: 96 mmol/L — ABNORMAL LOW (ref 101–111)
GFR calc Af Amer: >60 mL/min (ref >60)
GFR calc non Af Amer: 57 mL/min — ABNORMAL LOW (ref >60)
GLUCOSE: 76 mg/dL (ref 65–99)
Potassium: 3 mmol/L — ABNORMAL LOW (ref 3.5–5.1)
Sodium: 140 mmol/L (ref 135–145)

## 2016-11-10 LAB — CBC WITH DIFFERENTIAL/PLATELET
BASOS PCT: 0 %
Basophils Absolute: 0 10*3/uL (ref 0.0–0.1)
EOS PCT: 1 %
Eosinophils Absolute: 0 10*3/uL (ref 0.0–0.7)
HEMATOCRIT: 33.2 % — AB (ref 39.0–52.0)
Hemoglobin: 9.8 g/dL — ABNORMAL LOW (ref 13.0–17.0)
LYMPHS PCT: 11 %
Lymphs Abs: 0.8 10*3/uL (ref 0.7–4.0)
MCH: 26.8 pg (ref 26.0–34.0)
MCHC: 29.5 g/dL — AB (ref 30.0–36.0)
MCV: 90.7 fL (ref 78.0–100.0)
MONO ABS: 0.7 10*3/uL (ref 0.1–1.0)
MONOS PCT: 9 %
NEUTROS ABS: 6.1 10*3/uL (ref 1.7–7.7)
Neutrophils Relative %: 80 %
Platelets: 78 10*3/uL — ABNORMAL LOW (ref 150–400)
RBC: 3.66 MIL/uL — ABNORMAL LOW (ref 4.22–5.81)
RDW: 17.4 % — AB (ref 11.5–15.5)
WBC: 7.7 10*3/uL (ref 4.0–10.5)

## 2016-11-10 LAB — GLUCOSE, CAPILLARY
GLUCOSE-CAPILLARY: 62 mg/dL — AB (ref 65–99)
GLUCOSE-CAPILLARY: 72 mg/dL (ref 65–99)
GLUCOSE-CAPILLARY: 89 mg/dL (ref 65–99)
Glucose-Capillary: 152 mg/dL — ABNORMAL HIGH (ref 65–99)
Glucose-Capillary: 72 mg/dL (ref 65–99)
Glucose-Capillary: 74 mg/dL (ref 65–99)
Glucose-Capillary: 78 mg/dL (ref 65–99)

## 2016-11-10 LAB — BRAIN NATRIURETIC PEPTIDE: B Natriuretic Peptide: 464 pg/mL — ABNORMAL HIGH (ref 0.0–100.0)

## 2016-11-10 LAB — MAGNESIUM: Magnesium: 2 mg/dL (ref 1.7–2.4)

## 2016-11-10 MED ORDER — GLUCAGON HCL RDNA (DIAGNOSTIC) 1 MG IJ SOLR
INTRAMUSCULAR | Status: AC
  Administered 2016-11-10: 0.5 mg
  Filled 2016-11-10: qty 1

## 2016-11-10 MED ORDER — POTASSIUM CHLORIDE 10 MEQ/100ML IV SOLN: 10.0000 meq | INTRAVENOUS | Status: DC

## 2016-11-10 MED ORDER — POTASSIUM CHLORIDE 10 MEQ/100ML IV SOLN
10.0000 meq | INTRAVENOUS | Status: AC
  Administered 2016-11-10 (×6): 10 meq via INTRAVENOUS
  Filled 2016-11-10 (×5): qty 100

## 2016-11-10 NOTE — Progress Notes (Signed)
Full MBS report now available under the imaging section of the notes  Clinical Impressions:  Pt presents with mild oropharyngeal dysphagia. Pt exhibited adequate airway protection throughout the swallow study without aspiration. Trace penetration of thin liquids occured with consecutive large straw sips that was expelled after the swallow. Smaller cup sips of thin liquids prevented penetration. Pt with prolonged masticaiton of solid PO secondary to edentulous oral cavity. Recommend dysphagia 3 (mechanical soft) and thin liquids with medicines whole. No further ST needs identified.   Arvil Chaco MA, [facility] Speech Language Pathologist

## 2016-11-10 NOTE — Progress Notes (Signed)
Patient Name: [name] Date of Encounter: 11/10/2016  Primary Cardiologist: Dr. Aloha Gell [facility] Problem List     Principal Problem:   Atrial fibrillation with rapid ventricular response [facility]) Active Problems:   Essential hypertension, benign   COPD (chronic obstructive pulmonary disease) (HCC)   Stage III chronic kidney disease   Acute on chronic diastolic CHF (congestive heart failure) (HCC)   Aortic stenosis, moderate   Diffuse large B cell lymphoma (HCC)   Recurrent right pleural effusion   Chronic respiratory failure with hypoxia ([place])   DNR (do not resuscitate)   Telangiectasia   Symptomatic anemia    Subjective   Patient resting this morning, somewhat confused to situation. No chest pain or breathlessness at rest.  Inpatient Medications    Scheduled Meds: . ampicillin-sulbactam (UNASYN) IV  1.5 g Intravenous Q6H  . digoxin  0.25 mg Intravenous Daily  . famotidine (PEPCID) IV  20 mg Intravenous Q12H  . ferrous sulfate  325 mg Oral Daily  . furosemide  60 mg Intravenous BID  . levETIRAcetam  500 mg Intravenous Q12H  . midodrine  5 mg Oral TID WC  . potassium chloride  10 mEq Intravenous Q1 Hr x 6  . QUEtiapine  25 mg Oral BID  . sodium chloride flush  3 mL Intravenous Q12H  . tamsulosin  0.4 mg Oral QHS   Continuous Infusions: . diltiazem (CARDIZEM) infusion 10 mg/hr (11/10/16 0929)   PRN Meds: haloperidol lactate, HYDROmorphone (DILAUDID) injection, levalbuterol, methocarbamol   Vital Signs    Vitals:   11/10/16 0400 11/10/16 0500 11/10/16 0600 11/10/16 0715  BP: 103/66 (!) 103/51 111/68   Pulse: 87 (!) 144 (!) 107 (!) 50  Resp: 16 18 13 19   Temp: 97.8 F (36.6 C)   97.1 F (36.2 C)  TempSrc: Oral   Axillary  SpO2: 97% 98% 100% 100%  Weight:  191 lb 12.8 oz (87 kg)    Height:        Intake/Output Summary (Last 24 hours) at 11/10/16 0932 Last data filed at 11/10/16 0902  Gross per 24 hour  Intake           581.13 ml  Output              4700 ml  Net         -4118.87 ml   Filed Weights   11/07/16 1932 11/09/16 0500 11/10/16 0500  Weight: 203 lb 14.8 oz (92.5 kg) 194 lb 10.7 oz (88.3 kg) 191 lb 12.8 oz (87 kg)    Physical Exam   Gen.: Chronically ill-appearing bearded male. HEENT: Conjunctiva and lids normal, oropharynx clear. Neck: Supple, no elevated JVP or carotid bruits, no thyromegaly. Lungs: Scattered rhonchi significant with diminished breath sounds on the right, nonlabored breathing at rest. Cardiac: Irregularly irregular, no S3, 3/6 systolic murmur consistent with aortic stenosis, no pericardial rub. Abdomen: Soft, nontender, bowel sounds present. Extremities: No pitting edema, distal pulses 2+.  Labs    CBC  Recent Labs  11/08/16 0417  11/09/16 0223 11/10/16 0512  WBC 5.9  < > 9.8 7.7  NEUTROABS 5.3  --   --  6.1  HGB 7.4*  < > 9.3* 9.8*  HCT 24.9*  < > 30.6* 33.2*  MCV 90.9  < > 89.5 90.7  PLT 91*  < > 91* 78*  < > = values in this interval not displayed. Basic Metabolic Panel  Recent Labs  11/07/16 1440  11/09/16 0223 11/10/16  0512  NA 141  < > 139 140  K 4.4  < > 3.7 3.0*  CL 107  < > 98* 96*  CO2 30  < > 33* 35*  GLUCOSE 116*  < > 80 76  BUN 18  < > 23* 20  CREATININE 1.36*  < > 1.24 1.15  CALCIUM 7.7*  < > 8.1* 8.0*  MG 2.2  --   --  2.0  < > = values in this interval not displayed. Liver Function Tests  Recent Labs  11/08/16 0417  AST 26  ALT 13*  ALKPHOS 70  BILITOT 3.4*  PROT 6.2*  ALBUMIN 3.0*   Cardiac Enzymes  Recent Labs  11/07/16 1440 11/07/16 2122 11/08/16 0417  TROPONINI 0.06* 0.06* 0.06*    Telemetry    I personally reviewed telemetry monitoring which shows atrial fibrillation with intermittent RVR.  ECG    I personally reviewed the tracing from 11/07/2016 which showed atrial fibrillation with RVR, right bundle branch block, and left anterior fascicular block.  Radiology    Dg Chest Port 1 View  Result Date: 11/10/2016 CLINICAL  DATA:  Shortness of breath, COPD-asthma, acute on chronic CHF, chronic renal insufficiency stage III. EXAM: PORTABLE CHEST 1 VIEW COMPARISON:  Portable chest x-ray of November 09, 2016. FINDINGS: The left lung is reasonably well inflated. There is persistent volume loss on the right. There is a moderate size right pleural effusion and small left pleural effusion. The pulmonary interstitial markings are diffusely increased. The cardiac silhouette is enlarged. There is mild pulmonary vascular congestion but this has improved since yesterday's study. There is calcification in the wall of the thoracic aorta. The left subclavian venous catheter tip projects over the proximal SVC. IMPRESSION: COPD. CHF with pulmonary edema, slightly improved. Right lower lobe atelectasis or pneumonia with moderate-sized right pleural effusion slightly improved. Trace left pleural effusion. Electronically Signed   By: David  Martinique M.D.   On: 11/10/2016 08:53   Dg Chest Port 1 View  Result Date: 11/09/2016 CLINICAL DATA:  Shortness of breath history of COPD, CHF, aortic stenosis. EXAM: PORTABLE CHEST 1 VIEW COMPARISON:  Portable chest x-ray of November 07, 2016 FINDINGS: The patient is positioned in a lordotic matter similar to yesterday's study. The left lung is well-expanded. The interstitial markings remain increased. On the right there is further volume loss consistent with increased pleural effusion. Confluent density in the inferior aspect of the aerated right lung persists. The cardiac silhouette is enlarged. The pulmonary vascularity is mildly engorged. There is calcification in the wall of the aortic arch. The mediastinum is normal in width. IMPRESSION: Increased volume of pleural fluid on the right since a study of 2 days ago. CHF with pulmonary interstitial edema. COPD with persistent right lower lobe atelectasis or pneumonia. Electronically Signed   By: David  Martinique M.D.   On: 11/09/2016 08:28    Cardiac Studies    Echocardiogram 05/20/2015: Study Conclusions  - Left ventricle: The cavity size was normal. Wall thickness was   increased in a pattern of mild LVH. Systolic function was   vigorous. The estimated ejection fraction was in the range of 65%   to 70%. Wall motion was normal; there were no regional wall   motion abnormalities. The study is not technically sufficient to   allow evaluation of LV diastolic function. - Aortic valve: There was moderate to severe stenosis. Mean   gradient (S): 23 mm Hg. Peak gradient (S): 54 mm Hg. VTI ratio of  LVOT to aortic valve: 0.34. Valve area (VTI): 1.07 cm^2. Valve   area (Vmax): 1.08 cm^2. Valve area (Vmean): 1.21 cm^2. - Mitral valve: Calcified annulus. Mildly calcified leaflets . Mean   gradient (D): 4 mm Hg. Valve area by pressure half-time: 2.24   cm^2. Valve area by continuity equation (using LVOT flow): 1.67   cm^2. - Left atrium: The atrium was moderately dilated. - Right atrium: Central venous pressure (est): 3 mm Hg. - Atrial septum: There was increased thickness of the septum,   consistent with lipomatous hypertrophy. No defect or patent   foramen ovale was identified. - Tricuspid valve: There was trivial regurgitation. - Pulmonary arteries: Systolic pressure could not be accurately   estimated. - Pericardium, extracardiac: There was no pericardial effusion.  Impressions:  - Mild LVH with LVEF 65-70%, indeterminate diastolic function.   Moderate left atrial enlargement. Moderate to severe calcific   aortic stenosis as outlined above - there has been progression   compared to the prior study. Trivial tricuspid regurgitation,   unable to assess PASP.  Patient Profile     Chronically ill male with chronic afib and moderate AS. CML with recurrent Right pleural effusion. Exam with marked decreased BS right mid/base  AS murmur and telemetry with afib rates 110-125. Dyspnea from profound anemia COPD and recurrent large right pleural  effusion. Last thoracentesis not Diagnostic for CML involvement but suspicious with non monoclonal lymphocytes  Assessment & Plan    1. Chronic atrial fibrillation with RVR in the setting of comorbid illnesses. He is now back on intravenous Cardizem, continues on Lanoxin as well. Xarelto has been stopped by Dr. Johnsie Cancel in the face of severe anemia requiring PRBC transfusions and also suspected GI bleed.  2. Moderate to severe calcific aortic stenosis based on echocardiogram done back in May of last year. Per discussion with patient in past he has preferred a conservative approach, and at this point in light of declining health with multiple comorbidities, he would not be a candidate for surgical repair or TAVR.  3. Diffuse large cell B lymphoma with metastasis, followed by oncology. In the absence of effective treatment strategies, consider Palliative Care consultation.  4. Recurrent right pleural effusion, moderate in size with trace left pleural effusion. Patient also noted to have pulmonary edema, although good diuresis on IV Lasix last 24 hours.  5. CKD, stage 3 recent creatinine 1.2.  6. Acute on chronic diastolic heart failure in the setting of severe anemia, rapid atrial fibrillation, and aortic stenosis.  7. COPD.  Would continue IV Cardizem for now, may be able to transition back to oral regimen once volume status has improved somewhat. As noted above, consider Palliative Care involvement.  Signed, Satira Sark, M.D., F.A.C.C.  11/10/2016, 9:32 AM

## 2016-11-10 NOTE — Progress Notes (Signed)
PROGRESS NOTE                                                                                                                                                                                                             Patient Demographics:    [name], is a 82 y.o. male, DOB - May 19, 1931, [id number]  Admit date - 11/07/2016   Admitting Physician Reubin Milan, MD  Outpatient Primary MD for the patient is [name], MD  LOS - 3  Chief Complaint  Patient presents with  . Shortness of Breath       Brief Narrative   [name] is a 82 y.o. male with medical history significant of chronic iron deficiency anemia, telangiectasia of antrum and jejunum,  paroxysmal atrial fibrillation on Xarelto, chronic diastolic heart failure, 123456 deficiency, COPD, diffuse large B cell lymphoma, HX of disseminated herpes zoster, essential hypertension, hyperlipidemia, GERD, lupus anticoagulant, rheumatoid arthritis, pulmonary embolism > 1 yr ago, was admitted for generalized weakness, A. fib RVR and was found to have severe anemia with hemoglobin of 5.1. His fecal occult blood was positive. He received 2 units of packed RBC transfusion, became quite delirious and confused, currently cardiology is following him for A. fib, GI has been called. We'll also discuss the case with his hematologist.   Subjective:    [name] today In bed, confused, unreliable historian but says he is in no distress. Denies headache chest or abdominal pain.   Assessment  & Plan :     1.Severe symptomatic iron deficiency anemia with history of chronic iron deficiency anemia, telangiectasia of antrum and jejunum,  paroxysmal atrial fibrillation on Xarelto - was Hemoccult positive, received 2 units of packed RBC with stable H&H, continue Zantac, xaralto has been discontinued by cardiology. Agree with it. Monitor H&H. We'll also discuss the case  with patient's hematologist as he has underlying lymphoma as well. Continue oral iron supplement. GI has been requested to assist as well, may need outpt Small Bowel endoscopy.  2. A. fib with RVR. Chronic A. fib, [place] vasc 2 score of greater than 4. Currently on digoxin and Cardizem drip, cardiology following, further Rx deferred to cardiology, anti-coag held due to recurrent anemia from #1 above by Cards as risks outweigh benefits.  3. Acute on chronic diastolic CHF. EF 55-60%. Diurese with  IV Lasix and monitor.  4. Low-grade temperature morning of 11/09/2016, right lower lobe infiltrate. In the setting of delirium he has likely aspirated. Nothing by mouth except medications, speech eval, Unasyn for now. If temperature rises over 100.4 Will check cultures.  5. Moderate to severe aortic stenosis. For now have to diurese will continue to monitor. Cardiology on board.  6. BPH. Flomax resumed. Currently has Foley.  7. History of seizures. For now IV Keppra as he is nothing by mouth.  8. COPD. Continue supportive care and no wheezing or acute issues.  9. Delirium. Avoid benzodiazepines, minimize narcotics, Haldol and Seroquel. Center bedside. Fall and aspiration precautions.  10. Hypokalemia - replaced IV.   Family Communication  :  None  Code Status :  DNR  Diet : Diet NPO time specified    Disposition Plan  :  Step down  Consults  :  Cards, GI, Hame ( Tom Keflas over the phone)  Procedures  :     DVT Prophylaxis  :  SCDs    Lab Results  Component Value Date   PLT 78 (L) 11/10/2016    Inpatient Medications  Scheduled Meds: . ampicillin-sulbactam (UNASYN) IV  1.5 g Intravenous Q6H  . digoxin  0.25 mg Intravenous Daily  . famotidine (PEPCID) IV  20 mg Intravenous Q12H  . ferrous sulfate  325 mg Oral Daily  . furosemide  60 mg Intravenous BID  . levETIRAcetam  500 mg Intravenous Q12H  . midodrine  5 mg Oral TID WC  . potassium chloride  10 mEq Intravenous Q1 Hr x 6  .  QUEtiapine  25 mg Oral BID  . sodium chloride flush  3 mL Intravenous Q12H  . tamsulosin  0.4 mg Oral QHS   Continuous Infusions: . diltiazem (CARDIZEM) infusion 10 mg/hr (11/10/16 0600)   PRN Meds:.haloperidol lactate, HYDROmorphone (DILAUDID) injection, levalbuterol, methocarbamol  Antibiotics  :    Anti-infectives    Start     Dose/Rate Route Frequency Ordered Stop   11/09/16 1200  ampicillin-sulbactam (UNASYN) 1.5 g in sodium chloride 0.9 % 50 mL IVPB     1.5 g 100 mL/hr over 30 Minutes Intravenous Every 6 hours 11/09/16 1012           Objective:   Vitals:   11/10/16 0400 11/10/16 0500 11/10/16 0600 11/10/16 0715  BP: 103/66 (!) 103/51 111/68   Pulse: 87 (!) 144 (!) 107 (!) 50  Resp: 16 18 13 19   Temp: 97.8 F (36.6 C)   97.1 F (36.2 C)  TempSrc: Oral   Axillary  SpO2: 97% 98% 100% 100%  Weight:  87 kg (191 lb 12.8 oz)    Height:        Wt Readings from Last 3 Encounters:  11/10/16 87 kg (191 lb 12.8 oz)  09/15/16 88 kg (194 lb 1.6 oz)  07/26/16 91.5 kg (201 lb 12.8 oz)     Intake/Output Summary (Last 24 hours) at 11/10/16 0840 Last data filed at 11/10/16 0600  Gross per 24 hour  Intake           831.13 ml  Output             4700 ml  Net         -3868.87 ml     Physical Exam  Awake , confused, No new F.N deficits, Normal affect [place].AT,PERRAL Supple Neck,No JVD, No cervical lymphadenopathy appriciated.  Symmetrical Chest wall movement, Good air movement bilaterally, CTAB iRRR,No Gallops,Rubs, Positive aortic  systolic murmur, No Parasternal Heave +ve B.Sounds, Abd Soft, No tenderness, No organomegaly appriciated, No rebound - guarding or rigidity. No Cyanosis, Clubbing or edema, No new Rash or bruise       Data Review:    CBC  Recent Labs Lab 11/07/16 1440  11/08/16 0417 11/08/16 1029 11/08/16 1758 11/09/16 0223 11/10/16 0512  WBC 8.1  --  5.9 6.8 10.5 9.8 7.7  HGB 5.1*  < > 7.4* 7.5* 9.1* 9.3* 9.8*  HCT 18.8*  < > 24.9* 25.3* 30.1*  30.6* 33.2*  PLT 123*  --  91* 92* 95* 91* 78*  MCV 98.4  --  90.9 90.7 89.1 89.5 90.7  MCH 26.7  --  27.0 26.9 26.9 27.2 26.8  MCHC 27.1*  --  29.7* 29.6* 30.2 30.4 29.5*  RDW 16.1*  --  19.3* 19.4* 18.9* 18.4* 17.4*  LYMPHSABS 0.4*  --  0.3*  --   --   --  0.8  MONOABS 0.6  --  0.3  --   --   --  0.7  EOSABS 0.0  --  0.0  --   --   --  0.0  BASOSABS 0.0  --  0.0  --   --   --  0.0  < > = values in this interval not displayed.  Chemistries   Recent Labs Lab 11/07/16 1440 11/08/16 0417 11/09/16 0223 11/10/16 0512  NA 141 140 139 140  K 4.4 4.5 3.7 3.0*  CL 107 107 98* 96*  CO2 30 27 33* 35*  GLUCOSE 116* 132* 80 76  BUN 18 20 23* 20  CREATININE 1.36* 1.15 1.24 1.15  CALCIUM 7.7* 8.0* 8.1* 8.0*  MG 2.2  --   --  2.0  AST  --  26  --   --   ALT  --  13*  --   --   ALKPHOS  --  70  --   --   BILITOT  --  3.4*  --   --    ------------------------------------------------------------------------------------------------------------------ No results for input(s): CHOL, HDL, LDLCALC, TRIG, CHOLHDL, LDLDIRECT in the last 72 hours.  No results found for: HGBA1C ------------------------------------------------------------------------------------------------------------------ No results for input(s): TSH, T4TOTAL, T3FREE, THYROIDAB in the last 72 hours.  Invalid input(s): FREET3 ------------------------------------------------------------------------------------------------------------------  Recent Labs  11/09/16 0933  VITAMINB12 998*  FOLATE 30.6  FERRITIN 236  TIBC 290  IRON 10*  RETICCTPCT 3.9*    Coagulation profile No results for input(s): INR, PROTIME in the last 168 hours.  No results for input(s): DDIMER in the last 72 hours.  Cardiac Enzymes  Recent Labs Lab 11/07/16 1440 11/07/16 2122 11/08/16 0417  TROPONINI 0.06* 0.06* 0.06*   ------------------------------------------------------------------------------------------------------------------      Component Value Date/Time   BNP 464.0 (H) 11/10/2016 0512    Micro Results Recent Results (from the past 240 hour(s))  MRSA PCR Screening     Status: None   Collection Time: 11/07/16  8:25 PM  Result Value Ref Range Status   MRSA by PCR NEGATIVE NEGATIVE Final    Comment:        The GeneXpert MRSA Assay (FDA approved for NASAL specimens only), is one component of a comprehensive MRSA colonization surveillance program. It is not intended to diagnose MRSA infection nor to guide or monitor treatment for MRSA infections.     Radiology Reports Dg Chest Port 1 View  Result Date: 11/09/2016 CLINICAL DATA:  Shortness of breath history of COPD, CHF, aortic stenosis. EXAM: PORTABLE CHEST 1  VIEW COMPARISON:  Portable chest x-ray of November 07, 2016 FINDINGS: The patient is positioned in a lordotic matter similar to yesterday's study. The left lung is well-expanded. The interstitial markings remain increased. On the right there is further volume loss consistent with increased pleural effusion. Confluent density in the inferior aspect of the aerated right lung persists. The cardiac silhouette is enlarged. The pulmonary vascularity is mildly engorged. There is calcification in the wall of the aortic arch. The mediastinum is normal in width. IMPRESSION: Increased volume of pleural fluid on the right since a study of 2 days ago. CHF with pulmonary interstitial edema. COPD with persistent right lower lobe atelectasis or pneumonia. Electronically Signed   By: David  Martinique M.D.   On: 11/09/2016 08:28   Dg Chest Port 1 View  Result Date: 11/07/2016 CLINICAL DATA:  Worsening cough and shortness of breath. EXAM: PORTABLE CHEST 1 VIEW COMPARISON:  Single-view of the chest 05/28/2016 and 11/28/2015. FINDINGS: There is interstitial pulmonary edema. Moderate right pleural effusion with associated basilar airspace disease is noted. Port-A-Cath is in place. Heart size is upper normal. No pneumothorax.  IMPRESSION: Interstitial pulmonary edema with a moderate right pleural effusion and basilar airspace disease which could be atelectasis or pneumonia. Electronically Signed   By: Inge Rise M.D.   On: 11/07/2016 15:08    Time Spent in minutes  30   Ilia Dimaano K M.D on 11/10/2016 at 8:40 AM  Between 7am to 7pm - Pager - [phone]  After 7pm go to [email or web address] - password [facility]  [facility] -  Office  [phone]

## 2016-11-10 NOTE — Progress Notes (Signed)
Speech Language Pathology Treatment: Dysphagia  Patient Details Name: [name] MRN: [id number] DOB: 02-Dec-1931 Today's Date: 11/10/2016 Time: [id number] SLP Time Calculation (min) (ACUTE ONLY): 26 min  Assessment / Plan / Recommendation Clinical Impression  Pt seen at bedside for ongoing diagnostic dysphagia intervention. Bedside swallow evaluation was completed yesterday and recommendation made for NPO. Pt is alert and cooperative this date and tells SLP that he is "hungry as a bear". Pt noted to have poor respiratory support, audible congestion, and wheezing prior to po administration. Oral care completed and pt given ice chips to relieve xerostomia. Pt with seemingly prompt swallow across ice chips, thin water, and puree. Pt with occasional delayed cough after puree. Given improved tolerance today, will proceed with MBSS later today. Above to RN and Dr. Buford Dresser.   HPI HPI: 82 y.o.malewith medical history significant ofchronic iron deficiency anemia, telangiectasia of antrum and jejunum,paroxysmal atrial fibrillation on Xarelto, chronic diastolic heart failure, 123456 deficiency, COPD, diffuse large B cell lymphoma, HX of disseminated herpes zoster, essential hypertension, hyperlipidemia, GERD, lupus anticoagulant, rheumatoid arthritis, pulmonary embolism in 2000 03/04/2010, seizure disorder, stage III chronic kidney disease who comes to the emergency department with complaints of progressively worse fatigue, dyspnea and back pain. He has had several episodes of melena recently. Chest x ray significnat for Increased volume of pleural fluid on the right since a study of 2 days ago. CHF with pulmonary interstitial edema. COPD with persistent right lower lobe atelectasis or pneumonia      SLP Plan  MBS     Recommendations  Medication Administration: Via alternative means                Plan: MBS       Thank you,  Genene Churn, [place]                 [place] 11/10/2016, 2:51 PM

## 2016-11-10 NOTE — Progress Notes (Signed)
Subjective:  Patient has had some confusion overnight. Hand mits in place. [name]. Patient complains of diffuse abdominal pain. He had two brown stools yesterday per nursing report. [name] states he had black tarry stool 2 days prior to admission.   Objective: Vital signs in last 24 hours: Temp:  [97.1 F (36.2 C)-99.1 F (37.3 C)] 97.1 F (36.2 C) (11/16 0715) Pulse Rate:  [50-144] 50 (11/16 0715) Resp:  [13-30] 19 (11/16 0715) BP: (88-142)/(51-89) 111/68 (11/16 0600) SpO2:  [93 %-100 %] 100 % (11/16 0715) Weight:  [191 lb 12.8 oz (87 kg)] 191 lb 12.8 oz (87 kg) (11/16 0500) Last BM Date: 11/09/16 General:   Alert,  Chronically ill-appearing WM in NAD Head:  Normocephalic and atraumatic. Eyes:  Sclera clear, no icterus.  Abdomen:  Soft, nontender and nondistended. Normal bowel sounds, without guarding, and without rebound.   Extremities:  Without clubbing, deformity or edema. Neurologic:  Alert and  oriented to person/place. Skin:  Intact without significant lesions or rashes. Psych:  Alert and cooperative. Normal mood and affect.  Intake/Output from previous day: 11/15 0701 - 11/16 0700 In: 831.1 [I.V.:131.1; IV Piggyback:700] Out: 4700 [Urine:4700] Intake/Output this shift: No intake/output data recorded.  Lab Results: CBC  Recent Labs  11/08/16 1758 11/09/16 0223 11/10/16 0512  WBC 10.5 9.8 7.7  HGB 9.1* 9.3* 9.8*  HCT 30.1* 30.6* 33.2*  MCV 89.1 89.5 90.7  PLT 95* 91* 78*   BMET  Recent Labs  11/08/16 0417 11/09/16 0223 11/10/16 0512  NA 140 139 140  K 4.5 3.7 3.0*  CL 107 98* 96*  CO2 27 33* 35*  GLUCOSE 132* 80 76  BUN 20 23* 20  CREATININE 1.15 1.24 1.15  CALCIUM 8.0* 8.1* 8.0*   LFTs  Recent Labs  11/08/16 0417  BILITOT 3.4*  ALKPHOS 70  AST 26  ALT 13*  PROT 6.2*  ALBUMIN 3.0*   No results for input(s): LIPASE in the last 72 hours. PT/INR No results for input(s): LABPROT, INR in the last 72 hours.     Imaging Studies: Dg Chest Port 1 View  Result Date: 11/10/2016 CLINICAL DATA:  Shortness of breath, COPD-asthma, acute on chronic CHF, chronic renal insufficiency stage III. EXAM: PORTABLE CHEST 1 VIEW COMPARISON:  Portable chest x-ray of November 09, 2016. FINDINGS: The left lung is reasonably well inflated. There is persistent volume loss on the right. There is a moderate size right pleural effusion and small left pleural effusion. The pulmonary interstitial markings are diffusely increased. The cardiac silhouette is enlarged. There is mild pulmonary vascular congestion but this has improved since yesterday's study. There is calcification in the wall of the thoracic aorta. The left subclavian venous catheter tip projects over the proximal SVC. IMPRESSION: COPD. CHF with pulmonary edema, slightly improved. Right lower lobe atelectasis or pneumonia with moderate-sized right pleural effusion slightly improved. Trace left pleural effusion. Electronically Signed   By: David  Martinique M.D.   On: 11/10/2016 08:53   Dg Chest Port 1 View  Result Date: 11/09/2016 CLINICAL DATA:  Shortness of breath history of COPD, CHF, aortic stenosis. EXAM: PORTABLE CHEST 1 VIEW COMPARISON:  Portable chest x-ray of November 07, 2016 FINDINGS: The patient is positioned in a lordotic matter similar to yesterday's study. The left lung is well-expanded. The interstitial markings remain increased. On the right there is further volume loss consistent with increased pleural effusion. Confluent density in the inferior aspect of the aerated right lung persists. The cardiac silhouette  is enlarged. The pulmonary vascularity is mildly engorged. There is calcification in the wall of the aortic arch. The mediastinum is normal in width. IMPRESSION: Increased volume of pleural fluid on the right since a study of 2 days ago. CHF with pulmonary interstitial edema. COPD with persistent right lower lobe atelectasis or pneumonia. Electronically  Signed   By: David  Martinique M.D.   On: 11/09/2016 08:28   Dg Chest Port 1 View  Result Date: 11/07/2016 CLINICAL DATA:  Worsening cough and shortness of breath. EXAM: PORTABLE CHEST 1 VIEW COMPARISON:  Single-view of the chest 05/28/2016 and 11/28/2015. FINDINGS: There is interstitial pulmonary edema. Moderate right pleural effusion with associated basilar airspace disease is noted. Port-A-Cath is in place. Heart size is upper normal. No pneumothorax. IMPRESSION: Interstitial pulmonary edema with a moderate right pleural effusion and basilar airspace disease which could be atelectasis or pneumonia. Electronically Signed   By: Inge Rise M.D.   On: 11/07/2016 15:08  [2 weeks]   Assessment: 82 year old gentleman with multiple comorbidities including diffuse large B-cell lymphoma, now on palliative therapy, COPD, aortic stenosis, A. fib, pulmonary embolus, cirrhosis who is anticoagulated chronically (lupus anticoagulant positive). Patient presents with recurrent GI bleeding in the setting of chronic anticoagulation likely related to small bowel angiodysplasia although upper GI bleed is not excluded. He has aortic stenosis which is risk factor for GI angiodysplasia. Patient had a complete evaluation in December 2016 when he presented with GI bleeding/anemia. Found to have a few gastric and jejunal telangiectasias without stigmata of bleeding, to rectal ulcers as well. Decision at that time was to continue anticoagulation because the benefits outweighed the risk at that point.  At this time anticoagulants have been held. Patient has received 3 units of packed red blood cells, hemoglobin up appropriately. There is been no further melena since admission. He has interstitial pulmonary edema and moderate right pleural effusion on chest x-ray. Chest x-ray today with moderate-sized right pleural effusion, right lower lobe atelectasis versus pneumonia, CHF with pulmonary edema, slight  improvement.   Plan: 1. Agree with holding anticoagulation for now. 2. Continue to monitor for further GI bleeding or decline in Hgb. Could consider repeat EGD once respiratory status improved if evidence of ongoing bleeding.   Laureen Ochs. Bernarda Caffey [facility] [phone] 11/16/201711:49 AM     LOS: 3 days

## 2016-11-10 NOTE — Progress Notes (Signed)
PT Cancellation Note  Patient Details Name: [name] MRN: [id number] DOB: [birth year]   Cancelled Treatment:    Reason Eval/Treat Not Completed: Patient at procedure or test/unavailable (Will check back tomorrow. )   Eustaquio Maize [name], PT, DPT X: [phone]

## 2016-11-11 ENCOUNTER — Inpatient Hospital Stay (HOSPITAL_COMMUNITY): Payer: Medicare Other

## 2016-11-11 ENCOUNTER — Ambulatory Visit (HOSPITAL_COMMUNITY): Payer: Medicare Other | Admitting: Hematology & Oncology

## 2016-11-11 ENCOUNTER — Other Ambulatory Visit (HOSPITAL_COMMUNITY): Payer: Medicare Other

## 2016-11-11 ENCOUNTER — Ambulatory Visit (HOSPITAL_COMMUNITY): Payer: Medicare Other

## 2016-11-11 DIAGNOSIS — C8338 Diffuse large B-cell lymphoma, lymph nodes of multiple sites: Secondary | ICD-10-CM

## 2016-11-11 DIAGNOSIS — I509 Heart failure, unspecified: Secondary | ICD-10-CM

## 2016-11-11 DIAGNOSIS — N183 Chronic kidney disease, stage 3 (moderate): Secondary | ICD-10-CM

## 2016-11-11 LAB — CBC WITH DIFFERENTIAL/PLATELET
Basophils Absolute: 0 10*3/uL (ref 0.0–0.1)
Basophils Relative: 0 %
Eosinophils Absolute: 0.2 10*3/uL (ref 0.0–0.7)
Eosinophils Relative: 3 %
HEMATOCRIT: 32.6 % — AB (ref 39.0–52.0)
HEMOGLOBIN: 9.4 g/dL — AB (ref 13.0–17.0)
LYMPHS ABS: 0.6 10*3/uL — AB (ref 0.7–4.0)
LYMPHS PCT: 10 %
MCH: 26.4 pg (ref 26.0–34.0)
MCHC: 28.8 g/dL — AB (ref 30.0–36.0)
MCV: 91.6 fL (ref 78.0–100.0)
MONOS PCT: 11 %
Monocytes Absolute: 0.7 10*3/uL (ref 0.1–1.0)
NEUTROS ABS: 4.5 10*3/uL (ref 1.7–7.7)
NEUTROS PCT: 75 %
Platelets: 87 10*3/uL — ABNORMAL LOW (ref 150–400)
RBC: 3.56 MIL/uL — ABNORMAL LOW (ref 4.22–5.81)
RDW: 16.6 % — ABNORMAL HIGH (ref 11.5–15.5)
WBC: 6 10*3/uL (ref 4.0–10.5)

## 2016-11-11 LAB — GLUCOSE, CAPILLARY
GLUCOSE-CAPILLARY: 107 mg/dL — AB (ref 65–99)
GLUCOSE-CAPILLARY: 113 mg/dL — AB (ref 65–99)
GLUCOSE-CAPILLARY: 123 mg/dL — AB (ref 65–99)
Glucose-Capillary: 116 mg/dL — ABNORMAL HIGH (ref 65–99)

## 2016-11-11 LAB — TYPE AND SCREEN
ABO/RH(D): O NEG
Antibody Screen: NEGATIVE
UNIT DIVISION: 0
UNIT DIVISION: 0
Unit division: 0
Unit division: 0

## 2016-11-11 LAB — ECHOCARDIOGRAM COMPLETE
Height: 68 in
Weight: 2994.73 oz

## 2016-11-11 LAB — BASIC METABOLIC PANEL
Anion gap: 9 (ref 5–15)
BUN: 21 mg/dL — AB (ref 6–20)
CHLORIDE: 93 mmol/L — AB (ref 101–111)
CO2: 36 mmol/L — AB (ref 22–32)
CREATININE: 1.19 mg/dL (ref 0.61–1.24)
Calcium: 7.8 mg/dL — ABNORMAL LOW (ref 8.9–10.3)
GFR calc Af Amer: >60 mL/min (ref >60)
GFR calc non Af Amer: 55 mL/min — ABNORMAL LOW (ref >60)
Glucose, Bld: 93 mg/dL (ref 65–99)
Potassium: 2.8 mmol/L — ABNORMAL LOW (ref 3.5–5.1)
SODIUM: 138 mmol/L (ref 135–145)

## 2016-11-11 LAB — MAGNESIUM: Magnesium: 1.7 mg/dL (ref 1.7–2.4)

## 2016-11-11 MED ORDER — DIGOXIN 125 MCG PO TABS
0.1250 mg | ORAL_TABLET | Freq: Every day | ORAL | Status: DC
  Administered 2016-11-11 – 2016-11-15 (×5): 0.125 mg via ORAL
  Filled 2016-11-11 (×5): qty 1

## 2016-11-11 MED ORDER — POTASSIUM CHLORIDE CRYS ER 20 MEQ PO TBCR
40.0000 meq | EXTENDED_RELEASE_TABLET | Freq: Four times a day (QID) | ORAL | Status: AC
  Administered 2016-11-11 (×2): 40 meq via ORAL
  Filled 2016-11-11 (×2): qty 2

## 2016-11-11 MED ORDER — MAGNESIUM SULFATE 2 GM/50ML IV SOLN: 2.0000 g | Freq: Once | INTRAVENOUS | Status: DC

## 2016-11-11 MED ORDER — AMOXICILLIN-POT CLAVULANATE 500-125 MG PO TABS
1.0000 | ORAL_TABLET | Freq: Three times a day (TID) | ORAL | Status: AC
  Administered 2016-11-11 (×3): 500 mg via ORAL
  Filled 2016-11-11 (×3): qty 1

## 2016-11-11 MED ORDER — FAMOTIDINE 20 MG PO TABS
20.0000 mg | ORAL_TABLET | Freq: Two times a day (BID) | ORAL | Status: DC
  Administered 2016-11-11 – 2016-11-15 (×8): 20 mg via ORAL
  Filled 2016-11-11 (×9): qty 1

## 2016-11-11 MED ORDER — POTASSIUM CHLORIDE 10 MEQ/100ML IV SOLN
10.0000 meq | INTRAVENOUS | Status: AC
  Administered 2016-11-11 (×4): 10 meq via INTRAVENOUS
  Filled 2016-11-11 (×3): qty 100

## 2016-11-11 MED ORDER — DILTIAZEM HCL ER COATED BEADS 180 MG PO CP24
180.0000 mg | ORAL_CAPSULE | Freq: Every day | ORAL | Status: DC
  Administered 2016-11-11 – 2016-11-13 (×3): 180 mg via ORAL
  Filled 2016-11-11 (×3): qty 1

## 2016-11-11 NOTE — Care Management Important Message (Signed)
Important Message  Patient Details  Name: SHAQUEAL BORDES MRN: [id number] Date of Birth: Feb 06, 1931   Medicare Important Message Given:  Yes    Sherald Barge, RN 11/11/2016, 1:48 PM

## 2016-11-11 NOTE — Care Management Note (Signed)
Case Management Note  Patient Details  Name: [name] MRN: [id number] Date of Birth: January 04, 1931  Subjective/Objective:                  Pt is from home, lives with his daughter [name]. Pt requires some assistance with ADL's. He uses walker for ambulation. He has supplemental oxygen and neb machine at home PTA. PT has recommended HH but pt refuses. His daughter has asked for hospital bed so pt does not have to sleep in recliner. Bed ordered and pt has chosen [facility]. Romualdo Bolk, of [facility], aware of referral and will obtain pt info from chart. Pt plans to return home with self care, potentially over weekend.   Action/Plan: No CM needs anticipated unless pt changes his mind about HH.   Expected Discharge Date:     11/13/2016             Expected Discharge Plan:  Home/Self Care  In-House Referral:  NA  Discharge planning Services  CM Consult  Post Acute Care Choice:  Durable Medical Equipment Choice offered to:  Patient  DME Arranged:  Hospital bed DME Agency:  [facility].  Status of Service:  Completed, signed off   Sherald Barge, RN 11/11/2016, 1:46 PM

## 2016-11-11 NOTE — Progress Notes (Addendum)
PROGRESS NOTE                                                                                                                                                                                                             Patient Demographics:    [name], is a 82 y.o. male, DOB - [birth year], [id number]  Admit date - 11/07/2016   Admitting Physician Reubin Milan, MD  Outpatient Primary MD for the patient is [name], MD  LOS - 4  Chief Complaint  Patient presents with  . Shortness of Breath       Brief Narrative   [name] is a 82 y.o. male with medical history significant of chronic iron deficiency anemia, telangiectasia of antrum and jejunum,  paroxysmal atrial fibrillation on Xarelto, chronic diastolic heart failure, 123456 deficiency, COPD, diffuse large B cell lymphoma, HX of disseminated herpes zoster, essential hypertension, hyperlipidemia, GERD, lupus anticoagulant, rheumatoid arthritis, pulmonary embolism > 1 yr ago, was admitted for generalized weakness, A. fib RVR and was found to have severe anemia with hemoglobin of 5.1. His fecal occult blood was positive. He received 2 units of packed RBC transfusion, became quite delirious and confused, currently cardiology is following him for A. fib, GI has been called. We'll also discuss the case with his hematologist.   Subjective:    [name] today In bed, confused, unreliable historian but says he is in no distress. Denies headache chest or abdominal pain.   Assessment  & Plan :     1.Severe symptomatic iron deficiency anemia with history of chronic iron deficiency anemia, telangiectasia of antrum and jejunum,  paroxysmal atrial fibrillation on Xarelto - was Hemoccult positive, received 2 units of packed RBC with stable H&H, continue Zantac, xaralto has been discontinued by cardiology. Agree with it. Monitor H&H. I Discussed his case with  his hematologist as he has underlying lymphoma as well. Continue oral iron supplement. GI following, may need outpt Small Bowel endoscopy.  2. A. fib with RVR. Chronic A. fib, [place] vasc 2 score of greater than 4. Currently on digoxin and Cardizem drip, cardiology following, further Rx deferred to cardiology, anti-coag held due to recurrent anemia from #1 above by Cards as risks outweigh benefits.  3. Acute on chronic diastolic CHF. EF 55-60%. Diurese with IV Lasix and monitor. Much improved clinically.  4. Low-grade temperature morning of 11/09/2016, right lower lobe infiltrate. In the setting of delirium he has likely aspirated. Clinically much better and no signs of infection, stop all antibiotics on 11/11/2016.  5. Moderate to severe aortic stenosis. For now have to diurese will continue to monitor. Cardiology on board.  6. BPH. Flomax resumed. DC Foley.  7. History of seizures. For now IV Keppra as he is nothing by mouth.  8. COPD. Continue supportive care and no wheezing or acute issues.  9. Delirium. Avoid benzodiazepines, minimize narcotics, much improved and close to baseline on Haldol and Seroquel.   10. Hypokalemia - replaced IV & PO.   Family Communication  :  None  Code Status :  DNR  Diet : DIET DYS 3 Room service appropriate? Yes; Fluid consistency: Thin    Disposition Plan  :  Step down  Consults  :  Cards, GI, Hame ( Tom Keflas over the phone)  Procedures  :     DVT Prophylaxis  :  SCDs    Lab Results  Component Value Date   PLT 78 (L) 11/10/2016    Inpatient Medications  Scheduled Meds: . amoxicillin-clavulanate  1 tablet Oral Q8H  . digoxin  0.125 mg Oral Daily  . diltiazem  180 mg Oral Daily  . famotidine (PEPCID) IV  20 mg Intravenous Q12H  . ferrous sulfate  325 mg Oral Daily  . furosemide  60 mg Intravenous BID  . levETIRAcetam  500 mg Intravenous Q12H  . midodrine  5 mg Oral TID WC  . potassium chloride  10 mEq Intravenous Q1 Hr x 4  .  potassium chloride  40 mEq Oral Q6H  . QUEtiapine  25 mg Oral BID  . sodium chloride flush  3 mL Intravenous Q12H  . tamsulosin  0.4 mg Oral QHS   Continuous Infusions:  PRN Meds:.haloperidol lactate, levalbuterol, methocarbamol  Antibiotics  :    Anti-infectives    Start     Dose/Rate Route Frequency Ordered Stop   11/11/16 0800  amoxicillin-clavulanate (AUGMENTIN) 500-125 MG per tablet 500 mg     1 tablet Oral Every 8 hours 11/11/16 0745 11/13/16 0559   11/09/16 1200  ampicillin-sulbactam (UNASYN) 1.5 g in sodium chloride 0.9 % 50 mL IVPB  Status:  Discontinued     1.5 g 100 mL/hr over 30 Minutes Intravenous Every 6 hours 11/09/16 1012 11/11/16 0745         Objective:   Vitals:   11/11/16 0400 11/11/16 0500 11/11/16 0600 11/11/16 0832  BP: (!) 84/60 (!) 106/57 100/64   Pulse: 89 (!) 101 97   Resp: 19 20 (!) 25   Temp: 98.1 F (36.7 C)   (!) 96.8 F (36 C)  TempSrc: Oral   Oral  SpO2: 93% 100% 100%   Weight:  84.9 kg (187 lb 2.7 oz)    Height:        Wt Readings from Last 3 Encounters:  11/11/16 84.9 kg (187 lb 2.7 oz)  09/15/16 88 kg (194 lb 1.6 oz)  07/26/16 91.5 kg (201 lb 12.8 oz)     Intake/Output Summary (Last 24 hours) at 11/11/16 0953 Last data filed at 11/11/16 0915  Gross per 24 hour  Intake           913.92 ml  Output             3150 ml  Net         -2236.08 ml  Physical Exam  Awake , alert, No new F.N deficits, Normal affect [place].AT,PERRAL Supple Neck,No JVD, No cervical lymphadenopathy appriciated.  Symmetrical Chest wall movement, Good air movement bilaterally, few rales iRRR,No Gallops,Rubs, Positive aortic systolic murmur, No Parasternal Heave +ve B.Sounds, Abd Soft, No tenderness, No organomegaly appriciated, No rebound - guarding or rigidity. No Cyanosis, Clubbing or edema, No new Rash or bruise       Data Review:    CBC  Recent Labs Lab 11/07/16 1440  11/08/16 0417 11/08/16 1029 11/08/16 1758 11/09/16 0223  11/10/16 0512  WBC 8.1  --  5.9 6.8 10.5 9.8 7.7  HGB 5.1*  < > 7.4* 7.5* 9.1* 9.3* 9.8*  HCT 18.8*  < > 24.9* 25.3* 30.1* 30.6* 33.2*  PLT 123*  --  91* 92* 95* 91* 78*  MCV 98.4  --  90.9 90.7 89.1 89.5 90.7  MCH 26.7  --  27.0 26.9 26.9 27.2 26.8  MCHC 27.1*  --  29.7* 29.6* 30.2 30.4 29.5*  RDW 16.1*  --  19.3* 19.4* 18.9* 18.4* 17.4*  LYMPHSABS 0.4*  --  0.3*  --   --   --  0.8  MONOABS 0.6  --  0.3  --   --   --  0.7  EOSABS 0.0  --  0.0  --   --   --  0.0  BASOSABS 0.0  --  0.0  --   --   --  0.0  < > = values in this interval not displayed.  Chemistries   Recent Labs Lab 11/07/16 1440 11/08/16 0417 11/09/16 0223 11/10/16 0512 11/11/16 0425  NA 141 140 139 140 138  K 4.4 4.5 3.7 3.0* 2.8*  CL 107 107 98* 96* 93*  CO2 30 27 33* 35* 36*  GLUCOSE 116* 132* 80 76 93  BUN 18 20 23* 20 21*  CREATININE 1.36* 1.15 1.24 1.15 1.19  CALCIUM 7.7* 8.0* 8.1* 8.0* 7.8*  MG 2.2  --   --  2.0 1.7  AST  --  26  --   --   --   ALT  --  13*  --   --   --   ALKPHOS  --  70  --   --   --   BILITOT  --  3.4*  --   --   --    ------------------------------------------------------------------------------------------------------------------ No results for input(s): CHOL, HDL, LDLCALC, TRIG, CHOLHDL, LDLDIRECT in the last 72 hours.  No results found for: HGBA1C ------------------------------------------------------------------------------------------------------------------ No results for input(s): TSH, T4TOTAL, T3FREE, THYROIDAB in the last 72 hours.  Invalid input(s): FREET3 ------------------------------------------------------------------------------------------------------------------  Recent Labs  11/09/16 0933  VITAMINB12 998*  FOLATE 30.6  FERRITIN 236  TIBC 290  IRON 10*  RETICCTPCT 3.9*    Coagulation profile No results for input(s): INR, PROTIME in the last 168 hours.  No results for input(s): DDIMER in the last 72 hours.  Cardiac Enzymes  Recent Labs Lab  11/07/16 1440 11/07/16 2122 11/08/16 0417  TROPONINI 0.06* 0.06* 0.06*   ------------------------------------------------------------------------------------------------------------------    Component Value Date/Time   BNP 464.0 (H) 11/10/2016 0512    Micro Results Recent Results (from the past 240 hour(s))  MRSA PCR Screening     Status: None   Collection Time: 11/07/16  8:25 PM  Result Value Ref Range Status   MRSA by PCR NEGATIVE NEGATIVE Final    Comment:        The GeneXpert MRSA Assay (FDA approved for NASAL specimens only), is  one component of a comprehensive MRSA colonization surveillance program. It is not intended to diagnose MRSA infection nor to guide or monitor treatment for MRSA infections.     Radiology Reports Dg Chest Port 1 View  Result Date: 11/11/2016 CLINICAL DATA:  Shortness of breath today.  Ex-smoker. EXAM: PORTABLE CHEST 1 VIEW COMPARISON:  11/10/2016. FINDINGS: The cardiac silhouette remains borderline enlarged. Stable prominence of the pulmonary vasculature and interstitial markings. Stable right pleural effusion and right basilar airspace opacity. A small left pleural effusion is also stable. The left subclavian porta catheter is unchanged. Unremarkable bones. IMPRESSION: Stable changes of congestive heart failure with right basilar atelectasis or pneumonia. Electronically Signed   By: Claudie Revering M.D.   On: 11/11/2016 08:24   Dg Chest Port 1 View  Result Date: 11/10/2016 CLINICAL DATA:  Shortness of breath, COPD-asthma, acute on chronic CHF, chronic renal insufficiency stage III. EXAM: PORTABLE CHEST 1 VIEW COMPARISON:  Portable chest x-ray of November 09, 2016. FINDINGS: The left lung is reasonably well inflated. There is persistent volume loss on the right. There is a moderate size right pleural effusion and small left pleural effusion. The pulmonary interstitial markings are diffusely increased. The cardiac silhouette is enlarged. There is mild  pulmonary vascular congestion but this has improved since yesterday's study. There is calcification in the wall of the thoracic aorta. The left subclavian venous catheter tip projects over the proximal SVC. IMPRESSION: COPD. CHF with pulmonary edema, slightly improved. Right lower lobe atelectasis or pneumonia with moderate-sized right pleural effusion slightly improved. Trace left pleural effusion. Electronically Signed   By: David  Martinique M.D.   On: 11/10/2016 08:53   Dg Chest Port 1 View  Result Date: 11/09/2016 CLINICAL DATA:  Shortness of breath history of COPD, CHF, aortic stenosis. EXAM: PORTABLE CHEST 1 VIEW COMPARISON:  Portable chest x-ray of November 07, 2016 FINDINGS: The patient is positioned in a lordotic matter similar to yesterday's study. The left lung is well-expanded. The interstitial markings remain increased. On the right there is further volume loss consistent with increased pleural effusion. Confluent density in the inferior aspect of the aerated right lung persists. The cardiac silhouette is enlarged. The pulmonary vascularity is mildly engorged. There is calcification in the wall of the aortic arch. The mediastinum is normal in width. IMPRESSION: Increased volume of pleural fluid on the right since a study of 2 days ago. CHF with pulmonary interstitial edema. COPD with persistent right lower lobe atelectasis or pneumonia. Electronically Signed   By: David  Martinique M.D.   On: 11/09/2016 08:28   Dg Chest Port 1 View  Result Date: 11/07/2016 CLINICAL DATA:  Worsening cough and shortness of breath. EXAM: PORTABLE CHEST 1 VIEW COMPARISON:  Single-view of the chest 05/28/2016 and 11/28/2015. FINDINGS: There is interstitial pulmonary edema. Moderate right pleural effusion with associated basilar airspace disease is noted. Port-A-Cath is in place. Heart size is upper normal. No pneumothorax. IMPRESSION: Interstitial pulmonary edema with a moderate right pleural effusion and basilar airspace  disease which could be atelectasis or pneumonia. Electronically Signed   By: Inge Rise M.D.   On: 11/07/2016 15:08   Dg Swallowing Func-speech Pathology  Result Date: 11/10/2016 Objective Swallowing Evaluation: Type of Study: MBS-Modified Barium Swallow Study Patient Details Name: HURON DEPAULIS MRN: [id number] Date of Birth: March 10, 1931 Today's Date: 11/10/2016 Time: SLP Start Time (ACUTE ONLY): 1544-SLP Stop Time (ACUTE ONLY): 1556 SLP Time Calculation (min) (ACUTE ONLY): 12 min Past Medical History: Past Medical History: Diagnosis Date .  Anemia  . Aortic stenosis, moderate 05/21/2015 . Asthma  . B12 deficiency 07/28/2016 . Cellulitis of leg, left 01/03/2015 . Cervical spondylosis without myelopathy  . Cholelithiasis  . Chronic atrial fibrillation ([place])  . Chronic diastolic heart failure ([place])  . Chronic lower back pain  . Cirrhosis ([place])   Questionable, AFP normal Feb 01/2012, hx of ETOH use  . COPD (chronic obstructive pulmonary disease) ([facility])   Uses occasional nighttime O2 . Diffuse large B cell lymphoma ([place]) 08/22/2015 . Disseminated herpes zoster 2010 . DNR (do not resuscitate) 11/01/2015 . Dysrhythmia   chronic AFib . Essential hypertension, benign  . GERD (gastroesophageal reflux disease)  . Headache(784.0)  . History of chicken pox 1941; 2011 . Iron deficiency anemia 01/18/2012 . Lupus anticoagulant positive  . Mixed hyperlipidemia  . Pulmonary embolus ([place])   2003 and 2011 . Pulmonary nodules   Chest CT 09/11 . Rectal ulcer 12/01/2015 . Rheumatoid arthritis(714.0)  . Seizure disorder ([place]) 09/2015 . Stage III chronic kidney disease  . 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 BACK SURGERY   . CATARACT EXTRACTION W/ INTRAOCULAR LENS  IMPLANT, BILATERAL   . CHOLECYSTECTOMY  05/23/2012  Procedure: LAPAROSCOPIC CHOLECYSTECTOMY WITH INTRAOPERATIVE CHOLANGIOGRAM;  Surgeon: Stark Klein, MD;  Location: [facility];  Service: General;   Laterality: N/A; . COLONOSCOPY  01/24/2013  [id number] polyps/colonic diverticulosis . COLONOSCOPY N/A 12/01/2015  Procedure: COLONOSCOPY;  Surgeon: Danie Binder, MD;  [facility];  Service: Endoscopy;  Laterality: N/A; . ESOPHAGOGASTRODUODENOSCOPY  02/02/12  [id number] size hiatal hernia; otherwise normal exam . ESOPHAGOGASTRODUODENOSCOPY N/A 11/29/2015  [id number] diverticulosis/small internal hemorrhoids/left colon is redundant . LYMPH NODE BIOPSY   . LYMPH NODE BIOPSY Right 09/11/2015  Procedure: RIGHT INGUINAL LYMPH NODE BIOPSY;  Surgeon: Aviva Signs, MD;  Location: [facility];  Service: General;  Laterality: Right; . MYRINGOTOMY    "3 times; both ears" . POLYPECTOMY  02/02/2012  RMR: Multiple colonic polyps removed, flat, tubular adenomas/ Left-sided diverticulosis . PORTACATH PLACEMENT   . PORTACATH PLACEMENT Left 09/11/2015  Procedure: INSERTION PORT-A-CATH;  Surgeon: Aviva Signs, MD;  Location: [facility];  Service: General;  Laterality: Left; . POSTERIOR LUMBAR VERTEBRAE EXCISION  2003; 2009; 2011 HPI: 82 y.o.malewith medical history significant ofchronic iron deficiency anemia, telangiectasia of antrum and jejunum,paroxysmal atrial fibrillation on Xarelto, chronic diastolic heart failure, 123456 deficiency, COPD, diffuse large B cell lymphoma, HX of disseminated herpes zoster, essential hypertension, hyperlipidemia, GERD, lupus anticoagulant, rheumatoid arthritis, pulmonary embolism in 2000 03/04/2010, seizure disorder, stage III chronic kidney disease who comes to the emergency department with complaints of progressively worse fatigue, dyspnea and back pain. He has had several episodes of melena recently. Chest x ray significnat for Increased volume of pleural fluid on the right since a study of 2 days ago. CHF with pulmonary interstitial edema. COPD with persistent right lower lobe atelectasis or pneumonia No Data Recorded Assessment / Plan / Recommendation CHL IP CLINICAL IMPRESSIONS 11/10/2016  Therapy Diagnosis Mild oral phase dysphagia;Mild pharyngeal phase dysphagia Clinical Impression Pt presents with mild oropharyngeal dysphagia. Pt exhibited adequate airway protection throughout the swallow study without aspiration. Trace penetration of thin liquids occured with consecutive large straw sips that was expelled after the swallow. Smaller cup sips of thin liquids prevented penetration. Pt with  prolonged mastication of solid PO secondary to edentulous oral cavity. Recommend dysphagia 3 (mechanical soft) and thin liquids with medicines whole. No further ST needs identified.  Impact on safety and function  Mild aspiration risk   CHL IP TREATMENT RECOMMENDATION 11/10/2016 Treatment Recommendations No treatment recommended at this time   Prognosis 11/10/2016 Prognosis for Safe Diet Advancement Good Barriers to Reach Goals -- Barriers/Prognosis Comment -- CHL IP DIET RECOMMENDATION 11/10/2016 SLP Diet Recommendations Thin liquid;Regular solids Liquid Administration via Cup Medication Administration Whole meds with liquid Compensations Slow rate;Small sips/bites Postural Changes Remain semi-upright after after feeds/meals (Comment);Seated upright at 90 degrees   CHL IP OTHER RECOMMENDATIONS 11/10/2016 Recommended Consults -- Oral Care Recommendations Oral care BID Other Recommendations --   CHL IP FOLLOW UP RECOMMENDATIONS 11/10/2016 Follow up Recommendations 24 hour supervision/assistance   CHL IP FREQUENCY AND DURATION 11/09/2016 Speech Therapy Frequency (ACUTE ONLY) min 2x/week Treatment Duration 1 week      CHL IP ORAL PHASE 11/10/2016 Oral Phase Impaired Oral - Pudding Teaspoon -- Oral - Pudding Cup -- Oral - Honey Teaspoon -- Oral - Honey Cup -- Oral - Nectar Teaspoon -- Oral - Nectar Cup -- Oral - Nectar Straw -- Oral - Thin Teaspoon -- Oral - Thin Cup -- Oral - Thin Straw -- Oral - Puree -- Oral - Mech Soft -- Oral - Regular Impaired mastication;Delayed oral transit;Decreased bolus cohesion Oral -  Multi-Consistency -- Oral - Pill Delayed oral transit Oral Phase - Comment --  CHL IP PHARYNGEAL PHASE 11/10/2016 Pharyngeal Phase Impaired Pharyngeal- Pudding Teaspoon -- Pharyngeal -- Pharyngeal- Pudding Cup -- Pharyngeal -- Pharyngeal- Honey Teaspoon -- Pharyngeal -- Pharyngeal- Honey Cup -- Pharyngeal -- Pharyngeal- Nectar Teaspoon -- Pharyngeal -- Pharyngeal- Nectar Cup Delayed swallow initiation-vallecula Pharyngeal -- Pharyngeal- Nectar Straw -- Pharyngeal -- Pharyngeal- Thin Teaspoon -- Pharyngeal -- Pharyngeal- Thin Cup Delayed swallow initiation-vallecula Pharyngeal -- Pharyngeal- Thin Straw Penetration/Aspiration during swallow Pharyngeal Material enters airway, remains ABOVE vocal cords then ejected out Pharyngeal- Puree Delayed swallow initiation-vallecula Pharyngeal -- Pharyngeal- Mechanical Soft -- Pharyngeal -- Pharyngeal- Regular Delayed swallow initiation-vallecula Pharyngeal -- Pharyngeal- Multi-consistency -- Pharyngeal -- Pharyngeal- Pill Delayed swallow initiation-vallecula Pharyngeal -- Pharyngeal Comment --  CHL IP CERVICAL ESOPHAGEAL PHASE 11/10/2016 Cervical Esophageal Phase WFL Pudding Teaspoon -- Pudding Cup -- Honey Teaspoon -- Honey Cup -- Nectar Teaspoon -- Nectar Cup -- Nectar Straw -- Thin Teaspoon -- Thin Cup -- Thin Straw -- Puree -- Mechanical Soft -- Regular -- Multi-consistency -- Pill -- Cervical Esophageal Comment -- No flowsheet data found. Arvil Chaco MA, CCC-SLP Acute Care Speech Language Pathologist  Levi Aland 11/10/2016, 4:26 PM               Time Spent in minutes  30   Lala Lund K M.D on 11/11/2016 at 9:53 AM  Between 7am to 7pm - Pager - [phone]  After 7pm go to [email or web address] - password [facility]  [facility] -  Office  [phone]

## 2016-11-11 NOTE — Clinical Social Work Note (Addendum)
Patient's daughter, [name], was at bedside. Patient lives with Ms. [name], ambulates with a cane, bathes himself using a shower chair (Ms. [name] sets it up). They advised that they were not interested in SNF.  Patient stated that "Hell No" in regards to going to SNF for rehab. He stated that he will do any rehab needed in home.   CSW signing off.    [name], Clydene Pugh, LCSW

## 2016-11-11 NOTE — Progress Notes (Signed)
*  PRELIMINARY RESULTS* Echocardiogram 2D Echocardiogram has been performed.  [name] 11/11/2016, 3:51 PM

## 2016-11-11 NOTE — Progress Notes (Signed)
Patient Name: [name] Date of Encounter: 11/11/2016  Primary Cardiologist: Dr. Aloha Gell [facility] Problem List     Principal Problem:   Atrial fibrillation with rapid ventricular response [facility]) Active Problems:   Essential hypertension, benign   COPD (chronic obstructive pulmonary disease) (HCC)   Stage III chronic kidney disease   Acute on chronic diastolic CHF (congestive heart failure) (HCC)   Aortic stenosis, moderate   Diffuse large B cell lymphoma (HCC)   Recurrent right pleural effusion   Chronic respiratory failure with hypoxia ([place])   DNR (do not resuscitate)   Telangiectasia   Transfusion-dependent anemia   Melena    Subjective   Seems more alert and interactive this morning. Eating breakfast. No chest pain or palpitations.  Inpatient Medications    Scheduled Meds: . amoxicillin-clavulanate  1 tablet Oral Q8H  . digoxin  0.25 mg Intravenous Daily  . diltiazem  180 mg Oral Daily  . famotidine (PEPCID) IV  20 mg Intravenous Q12H  . ferrous sulfate  325 mg Oral Daily  . furosemide  60 mg Intravenous BID  . levETIRAcetam  500 mg Intravenous Q12H  . midodrine  5 mg Oral TID WC  . potassium chloride  10 mEq Intravenous Q1 Hr x 4  . potassium chloride  40 mEq Oral Q6H  . QUEtiapine  25 mg Oral BID  . sodium chloride flush  3 mL Intravenous Q12H  . tamsulosin  0.4 mg Oral QHS    PRN Meds: haloperidol lactate, levalbuterol, methocarbamol   Vital Signs    Vitals:   11/11/16 0300 11/11/16 0400 11/11/16 0500 11/11/16 0600  BP: (!) 102/58 (!) 84/60 (!) 106/57 100/64  Pulse: 98 89 (!) 101 97  Resp: (!) 26 19 20  (!) 25  Temp:  98.1 F (36.7 C)    TempSrc:  Oral    SpO2: 98% 93% 100% 100%  Weight:   187 lb 2.7 oz (84.9 kg)   Height:        Intake/Output Summary (Last 24 hours) at 11/11/16 0822 Last data filed at 11/11/16 0500  Gross per 24 hour  Intake           828.75 ml  Output             3150 ml  Net         -2321.25 ml   Filed  Weights   11/09/16 0500 11/10/16 0500 11/11/16 0500  Weight: 194 lb 10.7 oz (88.3 kg) 191 lb 12.8 oz (87 kg) 187 lb 2.7 oz (84.9 kg)    Physical Exam   Gen.: Chronically ill-appearing bearded male. HEENT: Conjunctiva and lids normal, oropharynx clear. Neck: Supple, no elevated JVP or carotid bruits, no thyromegaly. Lungs: Scattered rhonchi significant with diminished breath sounds on the right, nonlabored breathing at rest. Cardiac: Irregularly irregular, no S3, 3/6 systolic murmur consistent with aortic stenosis, no pericardial rub. Abdomen: Soft, nontender, bowel sounds present. Extremities: No pitting edema, distal pulses 2+.  Labs    CBC  Recent Labs  11/09/16 0223 11/10/16 0512  WBC 9.8 7.7  NEUTROABS  --  6.1  HGB 9.3* 9.8*  HCT 30.6* 33.2*  MCV 89.5 90.7  PLT 91* 78*   Basic Metabolic Panel  Recent Labs  11/10/16 0512 11/11/16 0425  NA 140 138  K 3.0* 2.8*  CL 96* 93*  CO2 35* 36*  GLUCOSE 76 93  BUN 20 21*  CREATININE 1.15 1.19  CALCIUM 8.0* 7.8*  MG 2.0  1.7    Telemetry    I personally reviewed telemetry monitoring which shows atrial fibrillation with intermittent RVR.  ECG    I personally reviewed the tracing from 11/07/2016 which showed atrial fibrillation with RVR, right bundle branch block, and left anterior fascicular block.  Radiology    Dg Chest Port 1 View  Result Date: 11/10/2016 CLINICAL DATA:  Shortness of breath, COPD-asthma, acute on chronic CHF, chronic renal insufficiency stage III. EXAM: PORTABLE CHEST 1 VIEW COMPARISON:  Portable chest x-ray of November 09, 2016. FINDINGS: The left lung is reasonably well inflated. There is persistent volume loss on the right. There is a moderate size right pleural effusion and small left pleural effusion. The pulmonary interstitial markings are diffusely increased. The cardiac silhouette is enlarged. There is mild pulmonary vascular congestion but this has improved since yesterday's study. There  is calcification in the wall of the thoracic aorta. The left subclavian venous catheter tip projects over the proximal SVC. IMPRESSION: COPD. CHF with pulmonary edema, slightly improved. Right lower lobe atelectasis or pneumonia with moderate-sized right pleural effusion slightly improved. Trace left pleural effusion. Electronically Signed   By: David  Martinique M.D.   On: 11/10/2016 08:53   Dg Swallowing Func-speech Pathology  Result Date: 11/10/2016 Objective Swallowing Evaluation: Type of Study: MBS-Modified Barium Swallow Study Patient Details Name: [name] MRN: [id number] Date of Birth: Feb 03, 1931 Today's Date: 11/10/2016 Time: SLP Start Time (ACUTE ONLY): 1544-SLP Stop Time (ACUTE ONLY): 1556 SLP Time Calculation (min) (ACUTE ONLY): 12 min Past Medical History: Past Medical History: Diagnosis Date . Anemia  . Aortic stenosis, moderate 05/21/2015 . Asthma  . B12 deficiency 07/28/2016 . Cellulitis of leg, left 01/03/2015 . Cervical spondylosis without myelopathy  . Cholelithiasis  . Chronic atrial fibrillation ([place])  . Chronic diastolic heart failure ([place])  . Chronic lower back pain  . Cirrhosis ([place])   Questionable, AFP normal Feb 01/2012, hx of ETOH use  . COPD (chronic obstructive pulmonary disease) (HCC)   Uses occasional nighttime O2 . Diffuse large B cell lymphoma ([place]) 08/22/2015 . Disseminated herpes zoster 2010 . DNR (do not resuscitate) 11/01/2015 . Dysrhythmia   chronic AFib . Essential hypertension, benign  . GERD (gastroesophageal reflux disease)  . Headache(784.0)  . History of chicken pox 1941; 2011 . Iron deficiency anemia 01/18/2012 . Lupus anticoagulant positive  . Mixed hyperlipidemia  . Pulmonary embolus ([place])   2003 and 2011 . Pulmonary nodules   Chest CT 09/11 . Rectal ulcer 12/01/2015 . Rheumatoid arthritis(714.0)  . Seizure disorder ([place]) 09/2015 . Stage III chronic kidney disease  . Telangiectasia 11/29/2015  ANTRUM AND JEJUNUM Past Surgical History: Past Surgical History: Procedure  Laterality Date . BACK SURGERY   . CATARACT EXTRACTION W/ INTRAOCULAR LENS  IMPLANT, BILATERAL   . CHOLECYSTECTOMY  05/23/2012  Procedure: LAPAROSCOPIC CHOLECYSTECTOMY WITH INTRAOPERATIVE CHOLANGIOGRAM;  Surgeon: Stark Klein, MD;  Location: [place];  Service: General;  Laterality: N/A; . COLONOSCOPY  01/24/2013  [id number] polyps/colonic diverticulosis . COLONOSCOPY N/A 12/01/2015  Procedure: COLONOSCOPY;  Surgeon: Danie Binder, MD;  [facility];  Service: Endoscopy;  Laterality: N/A; . ESOPHAGOGASTRODUODENOSCOPY  02/02/12  [id number] size hiatal hernia; otherwise normal exam . ESOPHAGOGASTRODUODENOSCOPY N/A 11/29/2015  [id number] diverticulosis/small internal hemorrhoids/left colon is redundant . LYMPH NODE BIOPSY   . LYMPH NODE BIOPSY Right 09/11/2015  Procedure: RIGHT INGUINAL LYMPH NODE BIOPSY;  Surgeon: Aviva Signs, MD;  Location: [facility];  Service: General;  Laterality: Right; . MYRINGOTOMY    "3 times; both  ears" . POLYPECTOMY  02/02/2012  RMR: Multiple colonic polyps removed, flat, tubular adenomas/ Left-sided diverticulosis . PORTACATH PLACEMENT   . PORTACATH PLACEMENT Left 09/11/2015  Procedure: INSERTION PORT-A-CATH;  Surgeon: Aviva Signs, MD;  Location: [facility];  Service: General;  Laterality: Left; . POSTERIOR LUMBAR VERTEBRAE EXCISION  2003; 2009; 2011 HPI: 82 y.o.malewith medical history significant ofchronic iron deficiency anemia, telangiectasia of antrum and jejunum,paroxysmal atrial fibrillation on Xarelto, chronic diastolic heart failure, [id number] deficiency, COPD, diffuse large B cell lymphoma, HX of disseminated herpes zoster, essential hypertension, hyperlipidemia, GERD, lupus anticoagulant, rheumatoid arthritis, pulmonary embolism in 2000 03/04/2010, seizure disorder, stage III chronic kidney disease who comes to the emergency department with complaints of progressively worse fatigue, dyspnea and back pain. He has had several episodes of melena recently. Chest x ray significnat for  Increased volume of pleural fluid on the right since a study of 2 days ago. CHF with pulmonary interstitial edema. COPD with persistent right lower lobe atelectasis or pneumonia No Data Recorded Assessment / Plan / Recommendation CHL IP CLINICAL IMPRESSIONS 11/10/2016 Therapy Diagnosis Mild oral phase dysphagia;Mild pharyngeal phase dysphagia Clinical Impression Pt presents with mild oropharyngeal dysphagia. Pt exhibited adequate airway protection throughout the swallow study without aspiration. Trace penetration of thin liquids occured with consecutive large straw sips that was expelled after the swallow. Smaller cup sips of thin liquids prevented penetration. Pt with  prolonged mastication of solid PO secondary to edentulous oral cavity. Recommend dysphagia 3 (mechanical soft) and thin liquids with medicines whole. No further ST needs identified.  Impact on safety and function Mild aspiration risk   CHL IP TREATMENT RECOMMENDATION 11/10/2016 Treatment Recommendations No treatment recommended at this time   Prognosis 11/10/2016 Prognosis for Safe Diet Advancement Good Barriers to Reach Goals -- Barriers/Prognosis Comment -- CHL IP DIET RECOMMENDATION 11/10/2016 SLP Diet Recommendations Thin liquid;Regular solids Liquid Administration via Cup Medication Administration Whole meds with liquid Compensations Slow rate;Small sips/bites Postural Changes Remain semi-upright after after feeds/meals (Comment);Seated upright at 90 degrees   CHL IP OTHER RECOMMENDATIONS 11/10/2016 Recommended Consults -- Oral Care Recommendations Oral care BID Other Recommendations --   CHL IP FOLLOW UP RECOMMENDATIONS 11/10/2016 Follow up Recommendations 24 hour supervision/assistance   CHL IP FREQUENCY AND DURATION 11/09/2016 Speech Therapy Frequency (ACUTE ONLY) min 2x/week Treatment Duration 1 week      CHL IP ORAL PHASE 11/10/2016 Oral Phase Impaired Oral - Pudding Teaspoon -- Oral - Pudding Cup -- Oral - Honey Teaspoon -- Oral - Honey  Cup -- Oral - Nectar Teaspoon -- Oral - Nectar Cup -- Oral - Nectar Straw -- Oral - Thin Teaspoon -- Oral - Thin Cup -- Oral - Thin Straw -- Oral - Puree -- Oral - Mech Soft -- Oral - Regular Impaired mastication;Delayed oral transit;Decreased bolus cohesion Oral - Multi-Consistency -- Oral - Pill Delayed oral transit Oral Phase - Comment --  CHL IP PHARYNGEAL PHASE 11/10/2016 Pharyngeal Phase Impaired Pharyngeal- Pudding Teaspoon -- Pharyngeal -- Pharyngeal- Pudding Cup -- Pharyngeal -- Pharyngeal- Honey Teaspoon -- Pharyngeal -- Pharyngeal- Honey Cup -- Pharyngeal -- Pharyngeal- Nectar Teaspoon -- Pharyngeal -- Pharyngeal- Nectar Cup Delayed swallow initiation-vallecula Pharyngeal -- Pharyngeal- Nectar Straw -- Pharyngeal -- Pharyngeal- Thin Teaspoon -- Pharyngeal -- Pharyngeal- Thin Cup Delayed swallow initiation-vallecula Pharyngeal -- Pharyngeal- Thin Straw Penetration/Aspiration during swallow Pharyngeal Material enters airway, remains ABOVE vocal cords then ejected out Pharyngeal- Puree Delayed swallow initiation-vallecula Pharyngeal -- Pharyngeal- Mechanical Soft -- Pharyngeal -- Pharyngeal- Regular Delayed swallow initiation-vallecula Pharyngeal -- Pharyngeal- Multi-consistency -- Pharyngeal --  Pharyngeal- Pill Delayed swallow initiation-vallecula Pharyngeal -- Pharyngeal Comment --  CHL IP CERVICAL ESOPHAGEAL PHASE 11/10/2016 Cervical Esophageal Phase WFL Pudding Teaspoon -- Pudding Cup -- Honey Teaspoon -- Honey Cup -- Nectar Teaspoon -- Nectar Cup -- Nectar Straw -- Thin Teaspoon -- Thin Cup -- Thin Straw -- Puree -- Mechanical Soft -- Regular -- Multi-consistency -- Pill -- Cervical Esophageal Comment -- No flowsheet data found. Arvil Chaco MA, CCC-SLP Acute Care Speech Language Pathologist  Levi Aland 11/10/2016, 4:26 PM               Cardiac Studies   Echocardiogram 05/20/2015: Study Conclusions  - Left ventricle: The cavity size was normal. Wall thickness was   increased in a  pattern of mild LVH. Systolic function was   vigorous. The estimated ejection fraction was in the range of 65%   to 70%. Wall motion was normal; there were no regional wall   motion abnormalities. The study is not technically sufficient to   allow evaluation of LV diastolic function. - Aortic valve: There was moderate to severe stenosis. Mean   gradient (S): 23 mm Hg. Peak gradient (S): 54 mm Hg. VTI ratio of   LVOT to aortic valve: 0.34. Valve area (VTI): 1.07 cm^2. Valve   area (Vmax): 1.08 cm^2. Valve area (Vmean): 1.21 cm^2. - Mitral valve: Calcified annulus. Mildly calcified leaflets . Mean   gradient (D): 4 mm Hg. Valve area by pressure half-time: 2.24   cm^2. Valve area by continuity equation (using LVOT flow): 1.67   cm^2. - Left atrium: The atrium was moderately dilated. - Right atrium: Central venous pressure (est): 3 mm Hg. - Atrial septum: There was increased thickness of the septum,   consistent with lipomatous hypertrophy. No defect or patent   foramen ovale was identified. - Tricuspid valve: There was trivial regurgitation. - Pulmonary arteries: Systolic pressure could not be accurately   estimated. - Pericardium, extracardiac: There was no pericardial effusion.  Impressions:  - Mild LVH with LVEF 65-70%, indeterminate diastolic function.   Moderate left atrial enlargement. Moderate to severe calcific   aortic stenosis as outlined above - there has been progression   compared to the prior study. Trivial tricuspid regurgitation,   unable to assess PASP.  Patient Profile     82 year old male with severe aortic stenosis, chronic atrial fibrillation, COPD, stage 3 kidney disease, and diffuse large cell lymphoma with recurrent right pleural effusion, and symptomatic anemia with GI bleed in the setting of GI telangiectasias. He has been taken off anticoagulation. Focusing on heart rate control of atrial fibrillation with medication adjustments. Do not plan to aggressively  pursue invasive management of aortic stenosis. Patient currently DO NOT RESUSCITATE.  Assessment & Plan    1. Chronic atrial fibrillation with RVR in the setting of comorbid illnesses. Heart rate trend is better, currently on Cardizem CD 180 mg daily and IV digoxin. Xarelto has been stopped the setting of GI bleed. Do not plan to resume anticoagulation.  2. Moderate to severe calcific aortic stenosis based on echocardiogram done back in May of last year. Per discussion with patient in past he has preferred a conservative approach, and at this point in light of declining health with multiple comorbidities, he would not be a candidate for surgical repair or TAVR.  3. Diffuse large cell B lymphoma with metastasis, followed by oncology. In the absence of effective treatment strategies, consider Palliative Care consultation.  4. Recurrent right pleural effusion, moderate in size with trace  left pleural effusion. He has been diuresing well on IV Lasix.  5. CKD, stage 3, stable creatinine 1.2.  6. Acute on chronic diastolic heart failure in the setting of severe anemia, rapid atrial fibrillation, and aortic stenosis.  7. COPD.  Continue IV Lasix with follow-up BMET in a.m. He is diuresing well with stable renal function, suspect that he will need to convert to oral diuretic within the next 24-48 hours. Continue diltiazem CD 180 mg daily and convert digoxin to oral dose. We do not plan to resume anticoagulation.  Signed, Satira Sark, M.D., F.A.C.C.  11/11/2016, 8:22 AM

## 2016-11-11 NOTE — Progress Notes (Signed)
Subjective: Today he states he's doing well. Has a good apetite. Denies abdominal pain, N/V, hematochezia, melena. No other GI symptoms.  Objective: Vital signs in last 24 hours: Temp:  [96.9 F (36.1 C)-98.1 F (36.7 C)] 98.1 F (36.7 C) (11/17 0400) Pulse Rate:  [35-122] 97 (11/17 0600) Resp:  [9-28] 25 (11/17 0600) BP: (83-114)/(51-85) 100/64 (11/17 0600) SpO2:  [77 %-100 %] 100 % (11/17 0600) Weight:  [187 lb 2.7 oz (84.9 kg)] 187 lb 2.7 oz (84.9 kg) (11/17 0500) Last BM Date: 11/10/16 General:   Alert and oriented, pleasant Head:  Normocephalic and atraumatic. Eyes:  No icterus, sclera clear. Conjuctiva pink.  Heart:  S1, S2 present, no murmurs noted.  Lungs: Clear to auscultation bilaterally, without wheezing, rales, or rhonchi.  Abdomen:  Bowel sounds present, soft, non-distended. Mild generalized TTP. No HSM or hernias noted. No rebound or guarding. No masses appreciated  Msk:  Symmetrical without gross deformities. Pulses:  Normal bilateral DP pulses noted. Extremities:  Without clubbing or edema. Neurologic:  Alert and oriented;  grossly normal neurologically. Psych:  Alert and cooperative. Normal mood and affect.  Intake/Output from previous day: 11/16 0701 - 11/17 0700 In: 828.8 [P.O.:180; I.V.:198.8; IV Piggyback:450] Out: 3150 [Urine:3150] Intake/Output this shift: No intake/output data recorded.  Lab Results:  Recent Labs  11/08/16 1758 11/09/16 0223 11/10/16 0512  WBC 10.5 9.8 7.7  HGB 9.1* 9.3* 9.8*  HCT 30.1* 30.6* 33.2*  PLT 95* 91* 78*   BMET  Recent Labs  11/09/16 0223 11/10/16 0512 11/11/16 0425  NA 139 140 138  K 3.7 3.0* 2.8*  CL 98* 96* 93*  CO2 33* 35* 36*  GLUCOSE 80 76 93  BUN 23* 20 21*  CREATININE 1.24 1.15 1.19  CALCIUM 8.1* 8.0* 7.8*   LFT No results for input(s): PROT, ALBUMIN, AST, ALT, ALKPHOS, BILITOT, BILIDIR, IBILI in the last 72 hours. PT/INR No results for input(s): LABPROT, INR in the last 72  hours. Hepatitis Panel No results for input(s): HEPBSAG, HCVAB, HEPAIGM, HEPBIGM in the last 72 hours.   Studies/Results: Dg Chest Port 1 View  Result Date: 11/11/2016 CLINICAL DATA:  Shortness of breath today.  Ex-smoker. EXAM: PORTABLE CHEST 1 VIEW COMPARISON:  11/10/2016. FINDINGS: The cardiac silhouette remains borderline enlarged. Stable prominence of the pulmonary vasculature and interstitial markings. Stable right pleural effusion and right basilar airspace opacity. A small left pleural effusion is also stable. The left subclavian porta catheter is unchanged. Unremarkable bones. IMPRESSION: Stable changes of congestive heart failure with right basilar atelectasis or pneumonia. Electronically Signed   By: Claudie Revering M.D.   On: 11/11/2016 08:24   Dg Chest Port 1 View  Result Date: 11/10/2016 CLINICAL DATA:  Shortness of breath, COPD-asthma, acute on chronic CHF, chronic renal insufficiency stage III. EXAM: PORTABLE CHEST 1 VIEW COMPARISON:  Portable chest x-ray of November 09, 2016. FINDINGS: The left lung is reasonably well inflated. There is persistent volume loss on the right. There is a moderate size right pleural effusion and small left pleural effusion. The pulmonary interstitial markings are diffusely increased. The cardiac silhouette is enlarged. There is mild pulmonary vascular congestion but this has improved since yesterday's study. There is calcification in the wall of the thoracic aorta. The left subclavian venous catheter tip projects over the proximal SVC. IMPRESSION: COPD. CHF with pulmonary edema, slightly improved. Right lower lobe atelectasis or pneumonia with moderate-sized right pleural effusion slightly improved. Trace left pleural effusion. Electronically Signed   By: Shanon Brow  [place] M.D.   On: 11/10/2016 08:53   Dg Swallowing Func-speech Pathology  Result Date: 11/10/2016 Objective Swallowing Evaluation: Type of Study: MBS-Modified Barium Swallow Study Patient Details  Name: BOONE CUEBAS MRN: [id number] Date of Birth: [birth year] Today's Date: 11/10/2016 Time: SLP Start Time (ACUTE ONLY): 1544-SLP Stop Time (ACUTE ONLY): 1556 SLP Time Calculation (min) (ACUTE ONLY): 12 min Past Medical History: Past Medical History: Diagnosis Date . Anemia  . Aortic stenosis, moderate 05/21/2015 . Asthma  . B12 deficiency 07/28/2016 . Cellulitis of leg, left 01/03/2015 . Cervical spondylosis without myelopathy  . Cholelithiasis  . Chronic atrial fibrillation ([place])  . Chronic diastolic heart failure ([place])  . Chronic lower back pain  . Cirrhosis ([place])   Questionable, AFP normal Feb 01/2012, hx of ETOH use  . COPD (chronic obstructive pulmonary disease) ([facility])   Uses occasional nighttime O2 . Diffuse large B cell lymphoma ([place]) 08/22/2015 . Disseminated herpes zoster 2010 . DNR (do not resuscitate) 11/01/2015 . Dysrhythmia   chronic AFib . Essential hypertension, benign  . GERD (gastroesophageal reflux disease)  . Headache(784.0)  . History of chicken pox 1941; 2011 . Iron deficiency anemia 01/18/2012 . Lupus anticoagulant positive  . Mixed hyperlipidemia  . Pulmonary embolus ([place])   2003 and 2011 . Pulmonary nodules   Chest CT 09/11 . Rectal ulcer 12/01/2015 . Rheumatoid arthritis(714.0)  . Seizure disorder ([place]) 09/2015 . Stage III chronic kidney disease  . 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 BACK SURGERY   . CATARACT EXTRACTION W/ INTRAOCULAR LENS  IMPLANT, BILATERAL   . CHOLECYSTECTOMY  05/23/2012  Procedure: LAPAROSCOPIC CHOLECYSTECTOMY WITH INTRAOPERATIVE CHOLANGIOGRAM;  Surgeon: Stark Klein, MD;  Location: [facility];  Service: General;  Laterality: N/A; . COLONOSCOPY  01/24/2013  [id number] polyps/colonic diverticulosis . COLONOSCOPY N/A 12/01/2015  Procedure: COLONOSCOPY;  Surgeon: Danie Binder, MD;  [facility];  Service: Endoscopy;  Laterality: N/A; . ESOPHAGOGASTRODUODENOSCOPY  02/02/12  [id number] size hiatal  hernia; otherwise normal exam . ESOPHAGOGASTRODUODENOSCOPY N/A 11/29/2015  [id number] diverticulosis/small internal hemorrhoids/left colon is redundant . LYMPH NODE BIOPSY   . LYMPH NODE BIOPSY Right 09/11/2015  Procedure: RIGHT INGUINAL LYMPH NODE BIOPSY;  Surgeon: Aviva Signs, MD;  Location: [facility];  Service: General;  Laterality: Right; . MYRINGOTOMY    "3 times; both ears" . POLYPECTOMY  02/02/2012  RMR: Multiple colonic polyps removed, flat, tubular adenomas/ Left-sided diverticulosis . PORTACATH PLACEMENT   . PORTACATH PLACEMENT Left 09/11/2015  Procedure: INSERTION PORT-A-CATH;  Surgeon: Aviva Signs, MD;  Location: [facility];  Service: General;  Laterality: Left; . POSTERIOR LUMBAR VERTEBRAE EXCISION  2003; 2009; 2011 HPI: 82 y.o.malewith medical history significant ofchronic iron deficiency anemia, telangiectasia of antrum and jejunum,paroxysmal atrial fibrillation on Xarelto, chronic diastolic heart failure, 123456 deficiency, COPD, diffuse large B cell lymphoma, HX of disseminated herpes zoster, essential hypertension, hyperlipidemia, GERD, lupus anticoagulant, rheumatoid arthritis, pulmonary embolism in 2000 03/04/2010, seizure disorder, stage III chronic kidney disease who comes to the emergency department with complaints of progressively worse fatigue, dyspnea and back pain. He has had several episodes of melena recently. Chest x ray significnat for Increased volume of pleural fluid on the right since a study of 2 days ago. CHF with pulmonary interstitial edema. COPD with persistent right lower lobe atelectasis or pneumonia No Data Recorded Assessment / Plan / Recommendation CHL IP CLINICAL IMPRESSIONS 11/10/2016 Therapy Diagnosis Mild oral phase dysphagia;Mild pharyngeal phase dysphagia Clinical Impression Pt presents with mild oropharyngeal dysphagia.  Pt exhibited adequate airway protection throughout the swallow study without aspiration. Trace penetration of thin liquids occured with consecutive large  straw sips that was expelled after the swallow. Smaller cup sips of thin liquids prevented penetration. Pt with  prolonged mastication of solid PO secondary to edentulous oral cavity. Recommend dysphagia 3 (mechanical soft) and thin liquids with medicines whole. No further ST needs identified.  Impact on safety and function Mild aspiration risk   CHL IP TREATMENT RECOMMENDATION 11/10/2016 Treatment Recommendations No treatment recommended at this time   Prognosis 11/10/2016 Prognosis for Safe Diet Advancement Good Barriers to Reach Goals -- Barriers/Prognosis Comment -- CHL IP DIET RECOMMENDATION 11/10/2016 SLP Diet Recommendations Thin liquid;Regular solids Liquid Administration via Cup Medication Administration Whole meds with liquid Compensations Slow rate;Small sips/bites Postural Changes Remain semi-upright after after feeds/meals (Comment);Seated upright at 90 degrees   CHL IP OTHER RECOMMENDATIONS 11/10/2016 Recommended Consults -- Oral Care Recommendations Oral care BID Other Recommendations --   CHL IP FOLLOW UP RECOMMENDATIONS 11/10/2016 Follow up Recommendations 24 hour supervision/assistance   CHL IP FREQUENCY AND DURATION 11/09/2016 Speech Therapy Frequency (ACUTE ONLY) min 2x/week Treatment Duration 1 week      CHL IP ORAL PHASE 11/10/2016 Oral Phase Impaired Oral - Pudding Teaspoon -- Oral - Pudding Cup -- Oral - Honey Teaspoon -- Oral - Honey Cup -- Oral - Nectar Teaspoon -- Oral - Nectar Cup -- Oral - Nectar Straw -- Oral - Thin Teaspoon -- Oral - Thin Cup -- Oral - Thin Straw -- Oral - Puree -- Oral - Mech Soft -- Oral - Regular Impaired mastication;Delayed oral transit;Decreased bolus cohesion Oral - Multi-Consistency -- Oral - Pill Delayed oral transit Oral Phase - Comment --  CHL IP PHARYNGEAL PHASE 11/10/2016 Pharyngeal Phase Impaired Pharyngeal- Pudding Teaspoon -- Pharyngeal -- Pharyngeal- Pudding Cup -- Pharyngeal -- Pharyngeal- Honey Teaspoon -- Pharyngeal -- Pharyngeal- Honey Cup --  Pharyngeal -- Pharyngeal- Nectar Teaspoon -- Pharyngeal -- Pharyngeal- Nectar Cup Delayed swallow initiation-vallecula Pharyngeal -- Pharyngeal- Nectar Straw -- Pharyngeal -- Pharyngeal- Thin Teaspoon -- Pharyngeal -- Pharyngeal- Thin Cup Delayed swallow initiation-vallecula Pharyngeal -- Pharyngeal- Thin Straw Penetration/Aspiration during swallow Pharyngeal Material enters airway, remains ABOVE vocal cords then ejected out Pharyngeal- Puree Delayed swallow initiation-vallecula Pharyngeal -- Pharyngeal- Mechanical Soft -- Pharyngeal -- Pharyngeal- Regular Delayed swallow initiation-vallecula Pharyngeal -- Pharyngeal- Multi-consistency -- Pharyngeal -- Pharyngeal- Pill Delayed swallow initiation-vallecula Pharyngeal -- Pharyngeal Comment --  CHL IP CERVICAL ESOPHAGEAL PHASE 11/10/2016 Cervical Esophageal Phase WFL Pudding Teaspoon -- Pudding Cup -- Honey Teaspoon -- Honey Cup -- Nectar Teaspoon -- Nectar Cup -- Nectar Straw -- Thin Teaspoon -- Thin Cup -- Thin Straw -- Puree -- Mechanical Soft -- Regular -- Multi-consistency -- Pill -- Cervical Esophageal Comment -- No flowsheet data found. Arvil Chaco MA, CCC-SLP Acute Care Speech Language Pathologist  Levi Aland 11/10/2016, 4:26 PM               Assessment: 82 year old gentleman with multiple comorbidities including diffuse large B-cell lymphoma, now on palliative therapy, COPD, aortic stenosis, A. fib, pulmonary embolus, cirrhosis who is anticoagulated chronically (lupus anticoagulant positive). Patient presents with recurrent GI bleeding in the setting of chronic anticoagulation likely related to small bowel angiodysplasia although upper GI bleed is not excluded. He has aortic stenosis which is risk factor for GI angiodysplasia. Patient had a complete evaluation in December 2016 when he presented with GI bleeding/anemia. Found to have a few gastric and jejunal telangiectasias without stigmata of bleeding, to rectal ulcers as  well. Decision at that  time was to continue anticoagulation because the benefits outweighed the risk at that point.  At this time anticoagulants have been held. Patient has received 3 units of packed red blood cells, hemoglobin up appropriately. There is been no further melena since admission. He has interstitial pulmonary edema and moderate right pleural effusion on chest x-ray. Chest x-ray today with moderate-sized right pleural effusion, right lower lobe atelectasis versus pneumonia, CHF with pulmonary edema, slight improvement.  Labs this morning show hypokalemia (2.8), baseline Cr. Mg normal. No CBC ordered yet.   Today he seems clinically stable/improved. No further bleed ing per patient, nurse states last BM was greenish-brown, no obvious hematochezia or melenotic stool. Has an appetite and has eaten about 25-50% of his breakfast so far.   Plan: 1. Electrolyte replacement per hospitalist 2. CBC today 3. Monitor for any recurrent GI bleed (specifically, melena) 4. Supportive measures 5. Continue to hold anticoagulation 6. Will need near discharge discussion of risks vs benefits of continuing anticoagulation given 2nd life-threatening bleed 7. Consider possible (benefits vs risks) EGD if re-bleed occurs this admission   Thank you for allowing [place] to participate in the care of Ervey T Marla Roe, DNP, AGNP-C Adult & Gerontological Nurse Practitioner [facility]      LOS: 4 days    11/11/2016, 8:31 AM

## 2016-11-11 NOTE — Evaluation (Signed)
Physical Therapy Evaluation Patient Details Name: [name] MRN: [id number] DOB: [birth year] Today's Date: 11/11/2016   History of Present Illness  82 y.o. male with medical history significant of chronic iron deficiency anemia, telangiectasia of antrum and jejunum,  paroxysmal atrial fibrillation on Xarelto, chronic diastolic heart failure, 123456 deficiency, COPD, diffuse large B cell lymphoma, HX of disseminated herpes zoster, essential hypertension, hyperlipidemia, GERD, lupus anticoagulant, rheumatoid arthritis, pulmonary embolism > 1 yr ago, was admitted for generalized weakness, A. fib RVR and was found to have severe anemia with hemoglobin of 5.1. His fecal occult blood was positive. He received 2 units of packed RBC transfusion, became quite delirious and confused, currently cardiology is following him for A. fib, GI has been called. We'll also discuss the case with his hematologist.  dx: Afib with RVR  Clinical Impression  Pt received in bed, dtr [name] present, and pt is marginally agreeable to PT evaluation.  Pt initially expressed, that he was not going to do anything, because he didn't feel like doing anything.  However, able to encourage pt to at least transfer into the chair.  Pt lives with his dtr, and normally uses a cane for ambulation.  He states he is independent with ADL's.  During PT evaluation, he required Min guard for supine<>sit, and Min guard for SPT bed <>chair.  He is recommended for HHPT, and 24/7 supervision/assistance.  However, he has expressed that he is not interested in HHPT at this time.    Follow Up Recommendations Home health PT;Supervision/Assistance - 24 hour;Other (comment) (However, pt expressed that he does not want HHPT. )    Equipment Recommendations  None recommended by PT    Recommendations for Other Services       Precautions / Restrictions Precautions Precautions: Fall Precaution Comments: due to recent immobility Restrictions Weight Bearing  Restrictions: No      Mobility  Bed Mobility Overal bed mobility: Needs Assistance Bed Mobility: Supine to Sit     Supine to sit: Min guard;HOB elevated (increased time)        Transfers Overall transfer level: Needs assistance Equipment used: None Transfers: Stand Pivot Transfers   Stand pivot transfers: Min guard          Ambulation/Gait Ambulation/Gait assistance:  (Pt refused)              Stairs            Wheelchair Mobility    Modified Rankin (Stroke Patients Only)       Balance Overall balance assessment: Needs assistance Sitting-balance support: Bilateral upper extremity supported;Feet supported Sitting balance-Leahy Scale: Fair     Standing balance support: Bilateral upper extremity supported Standing balance-Leahy Scale: Poor                               Pertinent Vitals/Pain Pain Assessment: 0-10 Pain Score: 9  Pain Location: "all over"  Pain Descriptors / Indicators: Aching Pain Intervention(s): Limited activity within patient's tolerance;Monitored during session;Repositioned    Home Living   Living Arrangements: Children (dtr tammy and son in law) Available Help at Discharge: Available 24 hours/day Type of Home: House Home Access: Stairs to enter   [employer] of Steps: 2 or 5 Home Layout: One level Home Equipment: Walker - 2 wheels;Walker - 4 wheels;Bedside commode;Shower seat      Prior Function Level of Independence: Independent with assistive device(s)   Gait / Transfers Assistance Needed: pt uses  a cane for ambulation  ADL's / Homemaking Assistance Needed: independent with ADL, Dtr assists with IADL's         Hand Dominance        Extremity/Trunk Assessment   Upper Extremity Assessment: Generalized weakness           Lower Extremity Assessment: Generalized weakness      Cervical / Trunk Assessment: Kyphotic (nearly flexed to 90* at hips when he stands to transfer. )   Communication   Communication: No difficulties  Cognition Arousal/Alertness: Awake/alert Behavior During Therapy:  (irritable) Overall Cognitive Status: Difficult to assess                      General Comments      Exercises     Assessment/Plan    PT Assessment Patient needs continued PT services  PT Problem List Decreased strength;Decreased activity tolerance;Decreased balance;Decreased mobility;Decreased cognition;Decreased knowledge of use of DME;Decreased safety awareness;Decreased knowledge of precautions;Pain          PT Treatment Interventions DME instruction;Gait training;Stair training;Functional mobility training;Therapeutic activities;Therapeutic exercise;Balance training;Cognitive remediation;Patient/family education    PT Goals (Current goals can be found in the Care Plan section)  Acute Rehab PT Goals Patient Stated Goal: To go home PT Goal Formulation: With patient/family Time For Goal Achievement: 11/25/16 Potential to Achieve Goals: Fair    Frequency Min 2X/week   Barriers to discharge   Pt is not very motivitated to participate with PT    Co-evaluation               End of Session Equipment Utilized During Treatment: Gait belt;Oxygen Activity Tolerance: Patient tolerated treatment well Patient left: in chair;with call bell/phone within reach;with family/visitor present Nurse Communication: Mobility status Tyrone Schimke, RN notified of pt's position and mobility status. )    Functional Assessment Tool Used: [employer] "6-clicks"  Functional Limitation: Mobility: Walking and moving around Mobility: Walking and Moving Around Current Status [phone]): At least 40 percent but less than 60 percent impaired, limited or restricted Mobility: Walking and Moving Around Goal Status [phone]): At least 20 percent but less than 40 percent impaired, limited or restricted    Time: 1106-1130 PT Time Calculation (min) (ACUTE ONLY): 24  min   Charges:   PT Evaluation $PT Eval Moderate Complexity: 1 Procedure PT Treatments $Therapeutic Activity: 8-22 mins   PT G Codes:   PT G-Codes **NOT FOR INPATIENT CLASS** Functional Assessment Tool Used: [employer] "6-clicks"  Functional Limitation: Mobility: Walking and moving around Mobility: Walking and Moving Around Current Status [phone]): At least 40 percent but less than 60 percent impaired, limited or restricted Mobility: Walking and Moving Around Goal Status [phone]): At least 20 percent but less than 40 percent impaired, limited or restricted    Beth Davelyn Gwinn, PT, DPT X: [phone]

## 2016-11-12 LAB — BASIC METABOLIC PANEL
ANION GAP: 8 (ref 5–15)
ANION GAP: 9 (ref 5–15)
BUN: 14 mg/dL (ref 6–20)
BUN: 13 mg/dL (ref 6–20)
CALCIUM: 8.2 mg/dL — AB (ref 8.9–10.3)
CALCIUM: 8.5 mg/dL — AB (ref 8.9–10.3)
CO2: 35 mmol/L — AB (ref 22–32)
CO2: 37 mmol/L — AB (ref 22–32)
CREATININE: 1.05 mg/dL (ref 0.61–1.24)
Chloride: 93 mmol/L — ABNORMAL LOW (ref 101–111)
Chloride: 93 mmol/L — ABNORMAL LOW (ref 101–111)
Creatinine, Ser: 1.24 mg/dL (ref 0.61–1.24)
GFR calc Af Amer: >60 mL/min (ref >60)
GFR, EST NON AFRICAN AMERICAN: 52 mL/min — AB (ref >60)
GLUCOSE: 111 mg/dL — AB (ref 65–99)
Glucose, Bld: 103 mg/dL — ABNORMAL HIGH (ref 65–99)
POTASSIUM: 3.3 mmol/L — AB (ref 3.5–5.1)
Potassium: 3.1 mmol/L — ABNORMAL LOW (ref 3.5–5.1)
Sodium: 136 mmol/L (ref 135–145)
Sodium: 139 mmol/L (ref 135–145)

## 2016-11-12 LAB — GLUCOSE, CAPILLARY
GLUCOSE-CAPILLARY: 131 mg/dL — AB (ref 65–99)
Glucose-Capillary: 104 mg/dL — ABNORMAL HIGH (ref 65–99)
Glucose-Capillary: 115 mg/dL — ABNORMAL HIGH (ref 65–99)

## 2016-11-12 LAB — MAGNESIUM
MAGNESIUM: 1.6 mg/dL — AB (ref 1.7–2.4)
Magnesium: 2.6 mg/dL — ABNORMAL HIGH (ref 1.7–2.4)

## 2016-11-12 LAB — CBC
HEMATOCRIT: 33.1 % — AB (ref 39.0–52.0)
HEMOGLOBIN: 9.8 g/dL — AB (ref 13.0–17.0)
MCH: 26.8 pg (ref 26.0–34.0)
MCHC: 29.6 g/dL — ABNORMAL LOW (ref 30.0–36.0)
MCV: 90.7 fL (ref 78.0–100.0)
Platelets: 86 10*3/uL — ABNORMAL LOW (ref 150–400)
RBC: 3.65 MIL/uL — AB (ref 4.22–5.81)
RDW: 16.2 % — ABNORMAL HIGH (ref 11.5–15.5)
WBC: 5.7 10*3/uL (ref 4.0–10.5)

## 2016-11-12 MED ORDER — LEVETIRACETAM IN NACL 500 MG/100ML IV SOLN
INTRAVENOUS | Status: AC
  Filled 2016-11-12: qty 100

## 2016-11-12 MED ORDER — MAGNESIUM SULFATE 50 % IJ SOLN
3.0000 g | Freq: Once | INTRAVENOUS | Status: AC
  Administered 2016-11-12: 3 g via INTRAVENOUS
  Filled 2016-11-12: qty 6

## 2016-11-12 MED ORDER — ACETAMINOPHEN 325 MG PO TABS
650.0000 mg | ORAL_TABLET | Freq: Four times a day (QID) | ORAL | Status: DC | PRN
  Administered 2016-11-12 – 2016-11-13 (×5): 650 mg via ORAL
  Filled 2016-11-12 (×5): qty 2

## 2016-11-12 MED ORDER — POTASSIUM CHLORIDE CRYS ER 20 MEQ PO TBCR
40.0000 meq | EXTENDED_RELEASE_TABLET | Freq: Two times a day (BID) | ORAL | Status: AC
  Administered 2016-11-12 (×2): 40 meq via ORAL
  Filled 2016-11-12 (×2): qty 2

## 2016-11-12 MED ORDER — FUROSEMIDE 40 MG PO TABS
40.0000 mg | ORAL_TABLET | Freq: Two times a day (BID) | ORAL | Status: DC
  Administered 2016-11-12 – 2016-11-13 (×3): 40 mg via ORAL
  Filled 2016-11-12 (×3): qty 1

## 2016-11-12 NOTE — Progress Notes (Signed)
PROGRESS NOTE                                                                                                                                                                                                             Patient Demographics:    [name], is a 82 y.o. male, DOB - September 18, 1931, [id number]  Admit date - 11/07/2016   Admitting Physician Reubin Milan, MD  Outpatient Primary MD for the patient is [name], MD  LOS - 5  Chief Complaint  Patient presents with  . Shortness of Breath       Brief Narrative   [name] is a 82 y.o. male with medical history significant of chronic iron deficiency anemia, telangiectasia of antrum and jejunum,  paroxysmal atrial fibrillation on Xarelto, chronic diastolic heart failure, 123456 deficiency, COPD, diffuse large B cell lymphoma, HX of disseminated herpes zoster, essential hypertension, hyperlipidemia, GERD, lupus anticoagulant, rheumatoid arthritis, pulmonary embolism > 1 yr ago, was admitted for generalized weakness, A. fib RVR and was found to have severe anemia with hemoglobin of 5.1. His fecal occult blood was positive. He received 2 units of packed RBC transfusion, became quite delirious and confused, currently cardiology is following him for A. fib, GI has been called. We'll also discuss the case with his hematologist.   Subjective:    [name] today In bed, confused, unreliable historian but says he is in no distress. Denies headache chest or abdominal pain.   Assessment  & Plan :     1.Severe symptomatic iron deficiency anemia with history of chronic iron deficiency anemia, telangiectasia of antrum and jejunum,  paroxysmal atrial fibrillation on Xarelto - was Hemoccult positive, received 2 units of packed RBC with stable H&H, continue Zantac, xaralto has been discontinued by cardiology. Agree with it. Monitor H&H. I Discussed his case with  his hematologist as he has underlying lymphoma as well. Continue oral iron supplement. GI following, may need outpt Small Bowel endoscopy.  2. A. fib with RVR. Chronic A. fib, [place] vasc 2 score of greater than 4. Currently on digoxin and Cardizem, cardiology following, further Rx deferred to cardiology, anti-coag held due to recurrent anemia from #1 above by Cards as risks outweigh benefits.  3. Acute on chronic diastolic CHF. EF 55-60%. Diurese with Lasix and monitor. Much improved clinically. -ve 8  lits.  4. Low-grade temperature morning of 11/09/2016, right lower lobe infiltrate. In the setting of delirium he has likely aspirated. Clinically much better and no signs of infection, stop all antibiotics on 11/11/2016.  5. Moderate to severe aortic stenosis. For now have to diurese will continue to monitor. Cardiology on board.  6. BPH. Flomax resumed. DC'd Foley.  7. History of seizures. For now IV Keppra as he is nothing by mouth.  8. COPD. Continue supportive care and no wheezing or acute issues.  9. Delirium. Avoid benzodiazepines, minimize narcotics, much improved and close to baseline on Haldol and Seroquel.   10. Hypokalemia and hypomagnesemia - replaced IV & PO. Will monitor.   Family Communication  :  None  Code Status :  DNR  Diet : DIET DYS 3 Room service appropriate? Yes; Fluid consistency: Thin    Disposition Plan  :  Step down  Consults  :  Cards, GI, Hame ( Tom Keflas over the phone)  Procedures  :     DVT Prophylaxis  :  SCDs    Lab Results  Component Value Date   PLT 86 (L) 11/12/2016    Inpatient Medications  Scheduled Meds: . digoxin  0.125 mg Oral Daily  . diltiazem  180 mg Oral Daily  . famotidine  20 mg Oral BID  . furosemide  60 mg Intravenous BID  . levETIRAcetam  500 mg Intravenous Q12H  . magnesium sulfate 1 - 4 g bolus IVPB  3 g Intravenous Once  . midodrine  5 mg Oral TID WC  . QUEtiapine  25 mg Oral BID  . sodium chloride flush  3 mL  Intravenous Q12H  . tamsulosin  0.4 mg Oral QHS   Continuous Infusions:  PRN Meds:.haloperidol lactate, levalbuterol, methocarbamol  Antibiotics  :    Anti-infectives    Start     Dose/Rate Route Frequency Ordered Stop   11/11/16 0800  amoxicillin-clavulanate (AUGMENTIN) 500-125 MG per tablet 500 mg     1 tablet Oral Every 8 hours 11/11/16 0745 11/11/16 2234   11/09/16 1200  ampicillin-sulbactam (UNASYN) 1.5 g in sodium chloride 0.9 % 50 mL IVPB  Status:  Discontinued     1.5 g 100 mL/hr over 30 Minutes Intravenous Every 6 hours 11/09/16 1012 11/11/16 0745         Objective:   Vitals:   11/12/16 0400 11/12/16 0500 11/12/16 0600 11/12/16 0820  BP: 94/67 (!) 150/121 112/71   Pulse: 85 63 96   Resp: 17 (!) 24    Temp: 97.7 F (36.5 C)   97 F (36.1 C)  TempSrc: Oral   Oral  SpO2: 100% 100% 100%   Weight: 82.5 kg (181 lb 14.1 oz)     Height:        Wt Readings from Last 3 Encounters:  11/12/16 82.5 kg (181 lb 14.1 oz)  09/15/16 88 kg (194 lb 1.6 oz)  07/26/16 91.5 kg (201 lb 12.8 oz)     Intake/Output Summary (Last 24 hours) at 11/12/16 0847 Last data filed at 11/12/16 0821  Gross per 24 hour  Intake              260 ml  Output             1402 ml  Net            -1142 ml     Physical Exam  Awake , alert, No new F.N deficits, Normal affect .AT,PERRAL Supple Neck,No JVD,  No cervical lymphadenopathy appriciated.  Symmetrical Chest wall movement, Good air movement bilaterally, few rales iRRR,No Gallops,Rubs, Positive aortic systolic murmur, No Parasternal Heave +ve B.Sounds, Abd Soft, No tenderness, No organomegaly appriciated, No rebound - guarding or rigidity. No Cyanosis, Clubbing or edema, No new Rash or bruise       Data Review:    CBC  Recent Labs Lab 11/07/16 1440  11/08/16 0417  11/08/16 1758 11/09/16 0223 11/10/16 0512 11/11/16 0954 11/12/16 0537  WBC 8.1  --  5.9  < > 10.5 9.8 7.7 6.0 5.7  HGB 5.1*  < > 7.4*  < > 9.1* 9.3* 9.8* 9.4*  9.8*  HCT 18.8*  < > 24.9*  < > 30.1* 30.6* 33.2* 32.6* 33.1*  PLT 123*  --  91*  < > 95* 91* 78* 87* 86*  MCV 98.4  --  90.9  < > 89.1 89.5 90.7 91.6 90.7  MCH 26.7  --  27.0  < > 26.9 27.2 26.8 26.4 26.8  MCHC 27.1*  --  29.7*  < > 30.2 30.4 29.5* 28.8* 29.6*  RDW 16.1*  --  19.3*  < > 18.9* 18.4* 17.4* 16.6* 16.2*  LYMPHSABS 0.4*  --  0.3*  --   --   --  0.8 0.6*  --   MONOABS 0.6  --  0.3  --   --   --  0.7 0.7  --   EOSABS 0.0  --  0.0  --   --   --  0.0 0.2  --   BASOSABS 0.0  --  0.0  --   --   --  0.0 0.0  --   < > = values in this interval not displayed.  Chemistries   Recent Labs Lab 11/07/16 1440 11/08/16 0417 11/09/16 0223 11/10/16 0512 11/11/16 0425 11/12/16 0537  NA 141 140 139 140 138  --   K 4.4 4.5 3.7 3.0* 2.8*  --   CL 107 107 98* 96* 93*  --   CO2 30 27 33* 35* 36*  --   GLUCOSE 116* 132* 80 76 93  --   BUN 18 20 23* 20 21*  --   CREATININE 1.36* 1.15 1.24 1.15 1.19  --   CALCIUM 7.7* 8.0* 8.1* 8.0* 7.8*  --   MG 2.2  --   --  2.0 1.7 1.6*  AST  --  26  --   --   --   --   ALT  --  13*  --   --   --   --   ALKPHOS  --  70  --   --   --   --   BILITOT  --  3.4*  --   --   --   --    ------------------------------------------------------------------------------------------------------------------ No results for input(s): CHOL, HDL, LDLCALC, TRIG, CHOLHDL, LDLDIRECT in the last 72 hours.  No results found for: HGBA1C ------------------------------------------------------------------------------------------------------------------ No results for input(s): TSH, T4TOTAL, T3FREE, THYROIDAB in the last 72 hours.  Invalid input(s): FREET3 ------------------------------------------------------------------------------------------------------------------  Recent Labs  11/09/16 0933  VITAMINB12 998*  FOLATE 30.6  FERRITIN 236  TIBC 290  IRON 10*  RETICCTPCT 3.9*    Coagulation profile No results for input(s): INR, PROTIME in the last 168 hours.  No  results for input(s): DDIMER in the last 72 hours.  Cardiac Enzymes  Recent Labs Lab 11/07/16 1440 11/07/16 2122 11/08/16 0417  TROPONINI 0.06* 0.06* 0.06*   ------------------------------------------------------------------------------------------------------------------    Component Value  Date/Time   BNP 464.0 (H) 11/10/2016 [id number]    Micro Results Recent Results (from the past 240 hour(s))  MRSA PCR Screening     Status: None   Collection Time: 11/07/16  8:25 PM  Result Value Ref Range Status   MRSA by PCR NEGATIVE NEGATIVE Final    Comment:        The GeneXpert MRSA Assay (FDA approved for NASAL specimens only), is one component of a comprehensive MRSA colonization surveillance program. It is not intended to diagnose MRSA infection nor to guide or monitor treatment for MRSA infections.     Radiology Reports Dg Chest Port 1 View  Result Date: 11/11/2016 CLINICAL DATA:  Shortness of breath today.  Ex-smoker. EXAM: PORTABLE CHEST 1 VIEW COMPARISON:  11/10/2016. FINDINGS: The cardiac silhouette remains borderline enlarged. Stable prominence of the pulmonary vasculature and interstitial markings. Stable right pleural effusion and right basilar airspace opacity. A small left pleural effusion is also stable. The left subclavian porta catheter is unchanged. Unremarkable bones. IMPRESSION: Stable changes of congestive heart failure with right basilar atelectasis or pneumonia. Electronically Signed   By: Claudie Revering M.D.   On: 11/11/2016 08:24   Dg Chest Port 1 View  Result Date: 11/10/2016 CLINICAL DATA:  Shortness of breath, COPD-asthma, acute on chronic CHF, chronic renal insufficiency stage III. EXAM: PORTABLE CHEST 1 VIEW COMPARISON:  Portable chest x-ray of November 09, 2016. FINDINGS: The left lung is reasonably well inflated. There is persistent volume loss on the right. There is a moderate size right pleural effusion and small left pleural effusion. The pulmonary  interstitial markings are diffusely increased. The cardiac silhouette is enlarged. There is mild pulmonary vascular congestion but this has improved since yesterday's study. There is calcification in the wall of the thoracic aorta. The left subclavian venous catheter tip projects over the proximal SVC. IMPRESSION: COPD. CHF with pulmonary edema, slightly improved. Right lower lobe atelectasis or pneumonia with moderate-sized right pleural effusion slightly improved. Trace left pleural effusion. Electronically Signed   By: David  Martinique M.D.   On: 11/10/2016 08:53   Dg Chest Port 1 View  Result Date: 11/09/2016 CLINICAL DATA:  Shortness of breath history of COPD, CHF, aortic stenosis. EXAM: PORTABLE CHEST 1 VIEW COMPARISON:  Portable chest x-ray of November 07, 2016 FINDINGS: The patient is positioned in a lordotic matter similar to yesterday's study. The left lung is well-expanded. The interstitial markings remain increased. On the right there is further volume loss consistent with increased pleural effusion. Confluent density in the inferior aspect of the aerated right lung persists. The cardiac silhouette is enlarged. The pulmonary vascularity is mildly engorged. There is calcification in the wall of the aortic arch. The mediastinum is normal in width. IMPRESSION: Increased volume of pleural fluid on the right since a study of 2 days ago. CHF with pulmonary interstitial edema. COPD with persistent right lower lobe atelectasis or pneumonia. Electronically Signed   By: David  Martinique M.D.   On: 11/09/2016 08:28   Dg Chest Port 1 View  Result Date: 11/07/2016 CLINICAL DATA:  Worsening cough and shortness of breath. EXAM: PORTABLE CHEST 1 VIEW COMPARISON:  Single-view of the chest 05/28/2016 and 11/28/2015. FINDINGS: There is interstitial pulmonary edema. Moderate right pleural effusion with associated basilar airspace disease is noted. Port-A-Cath is in place. Heart size is upper normal. No pneumothorax.  IMPRESSION: Interstitial pulmonary edema with a moderate right pleural effusion and basilar airspace disease which could be atelectasis or pneumonia. Electronically Signed  By: Inge Rise M.D.   On: 11/07/2016 15:08   Dg Swallowing Func-speech Pathology  Result Date: 11/10/2016 Objective Swallowing Evaluation: Type of Study: MBS-Modified Barium Swallow Study Patient Details Name: [name] MRN: [id number] Date of Birth: [birth year] Today's Date: 11/10/2016 Time: SLP Start Time (ACUTE ONLY): 1544-SLP Stop Time (ACUTE ONLY): 1556 SLP Time Calculation (min) (ACUTE ONLY): 12 min Past Medical History: Past Medical History: Diagnosis Date . Anemia  . Aortic stenosis, moderate 05/21/2015 . Asthma  . B12 deficiency 07/28/2016 . Cellulitis of leg, left 01/03/2015 . Cervical spondylosis without myelopathy  . Cholelithiasis  . Chronic atrial fibrillation ([place])  . Chronic diastolic heart failure ([place])  . Chronic lower back pain  . Cirrhosis ([place])   Questionable, AFP normal Feb 01/2012, hx of ETOH use  . COPD (chronic obstructive pulmonary disease) ([facility])   Uses occasional nighttime O2 . Diffuse large B cell lymphoma ([place]) 08/22/2015 . Disseminated herpes zoster 2010 . DNR (do not resuscitate) 11/01/2015 . Dysrhythmia   chronic AFib . Essential hypertension, benign  . GERD (gastroesophageal reflux disease)  . Headache(784.0)  . History of chicken pox 1941; 2011 . Iron deficiency anemia 01/18/2012 . Lupus anticoagulant positive  . Mixed hyperlipidemia  . Pulmonary embolus ([place])   2003 and 2011 . Pulmonary nodules   Chest CT 09/11 . Rectal ulcer 12/01/2015 . Rheumatoid arthritis(714.0)  . Seizure disorder ([place]) 09/2015 . Stage III chronic kidney disease  . 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 BACK SURGERY   . CATARACT EXTRACTION W/ INTRAOCULAR LENS  IMPLANT, BILATERAL   . CHOLECYSTECTOMY  05/23/2012  Procedure: LAPAROSCOPIC CHOLECYSTECTOMY WITH  INTRAOPERATIVE CHOLANGIOGRAM;  Surgeon: Stark Klein, MD;  Location: [facility];  Service: General;  Laterality: N/A; . COLONOSCOPY  01/24/2013  [id number] polyps/colonic diverticulosis . COLONOSCOPY N/A 12/01/2015  Procedure: COLONOSCOPY;  Surgeon: Danie Binder, MD;  [facility];  Service: Endoscopy;  Laterality: N/A; . ESOPHAGOGASTRODUODENOSCOPY  02/02/12  [id number] size hiatal hernia; otherwise normal exam . ESOPHAGOGASTRODUODENOSCOPY N/A 11/29/2015  [id number] diverticulosis/small internal hemorrhoids/left colon is redundant . LYMPH NODE BIOPSY   . LYMPH NODE BIOPSY Right 09/11/2015  Procedure: RIGHT INGUINAL LYMPH NODE BIOPSY;  Surgeon: Aviva Signs, MD;  Location: [facility];  Service: General;  Laterality: Right; . MYRINGOTOMY    "3 times; both ears" . POLYPECTOMY  02/02/2012  RMR: Multiple colonic polyps removed, flat, tubular adenomas/ Left-sided diverticulosis . PORTACATH PLACEMENT   . PORTACATH PLACEMENT Left 09/11/2015  Procedure: INSERTION PORT-A-CATH;  Surgeon: Aviva Signs, MD;  Location: [facility];  Service: General;  Laterality: Left; . POSTERIOR LUMBAR VERTEBRAE EXCISION  2003; 2009; 2011 HPI: 82 y.o.malewith medical history significant ofchronic iron deficiency anemia, telangiectasia of antrum and jejunum,paroxysmal atrial fibrillation on Xarelto, chronic diastolic heart failure, 123456 deficiency, COPD, diffuse large B cell lymphoma, HX of disseminated herpes zoster, essential hypertension, hyperlipidemia, GERD, lupus anticoagulant, rheumatoid arthritis, pulmonary embolism in 2000 03/04/2010, seizure disorder, stage III chronic kidney disease who comes to the emergency department with complaints of progressively worse fatigue, dyspnea and back pain. He has had several episodes of melena recently. Chest x ray significnat for Increased volume of pleural fluid on the right since a study of 2 days ago. CHF with pulmonary interstitial edema. COPD with persistent right lower lobe atelectasis or  pneumonia No Data Recorded Assessment / Plan / Recommendation CHL IP CLINICAL IMPRESSIONS 11/10/2016 Therapy Diagnosis Mild oral phase dysphagia;Mild pharyngeal phase dysphagia Clinical Impression Pt presents with  mild oropharyngeal dysphagia. Pt exhibited adequate airway protection throughout the swallow study without aspiration. Trace penetration of thin liquids occured with consecutive large straw sips that was expelled after the swallow. Smaller cup sips of thin liquids prevented penetration. Pt with  prolonged mastication of solid PO secondary to edentulous oral cavity. Recommend dysphagia 3 (mechanical soft) and thin liquids with medicines whole. No further ST needs identified.  Impact on safety and function Mild aspiration risk   CHL IP TREATMENT RECOMMENDATION 11/10/2016 Treatment Recommendations No treatment recommended at this time   Prognosis 11/10/2016 Prognosis for Safe Diet Advancement Good Barriers to Reach Goals -- Barriers/Prognosis Comment -- CHL IP DIET RECOMMENDATION 11/10/2016 SLP Diet Recommendations Thin liquid;Regular solids Liquid Administration via Cup Medication Administration Whole meds with liquid Compensations Slow rate;Small sips/bites Postural Changes Remain semi-upright after after feeds/meals (Comment);Seated upright at 90 degrees   CHL IP OTHER RECOMMENDATIONS 11/10/2016 Recommended Consults -- Oral Care Recommendations Oral care BID Other Recommendations --   CHL IP FOLLOW UP RECOMMENDATIONS 11/10/2016 Follow up Recommendations 24 hour supervision/assistance   CHL IP FREQUENCY AND DURATION 11/09/2016 Speech Therapy Frequency (ACUTE ONLY) min 2x/week Treatment Duration 1 week      CHL IP ORAL PHASE 11/10/2016 Oral Phase Impaired Oral - Pudding Teaspoon -- Oral - Pudding Cup -- Oral - Honey Teaspoon -- Oral - Honey Cup -- Oral - Nectar Teaspoon -- Oral - Nectar Cup -- Oral - Nectar Straw -- Oral - Thin Teaspoon -- Oral - Thin Cup -- Oral - Thin Straw -- Oral - Puree -- Oral - Mech  Soft -- Oral - Regular Impaired mastication;Delayed oral transit;Decreased bolus cohesion Oral - Multi-Consistency -- Oral - Pill Delayed oral transit Oral Phase - Comment --  CHL IP PHARYNGEAL PHASE 11/10/2016 Pharyngeal Phase Impaired Pharyngeal- Pudding Teaspoon -- Pharyngeal -- Pharyngeal- Pudding Cup -- Pharyngeal -- Pharyngeal- Honey Teaspoon -- Pharyngeal -- Pharyngeal- Honey Cup -- Pharyngeal -- Pharyngeal- Nectar Teaspoon -- Pharyngeal -- Pharyngeal- Nectar Cup Delayed swallow initiation-vallecula Pharyngeal -- Pharyngeal- Nectar Straw -- Pharyngeal -- Pharyngeal- Thin Teaspoon -- Pharyngeal -- Pharyngeal- Thin Cup Delayed swallow initiation-vallecula Pharyngeal -- Pharyngeal- Thin Straw Penetration/Aspiration during swallow Pharyngeal Material enters airway, remains ABOVE vocal cords then ejected out Pharyngeal- Puree Delayed swallow initiation-vallecula Pharyngeal -- Pharyngeal- Mechanical Soft -- Pharyngeal -- Pharyngeal- Regular Delayed swallow initiation-vallecula Pharyngeal -- Pharyngeal- Multi-consistency -- Pharyngeal -- Pharyngeal- Pill Delayed swallow initiation-vallecula Pharyngeal -- Pharyngeal Comment --  CHL IP CERVICAL ESOPHAGEAL PHASE 11/10/2016 Cervical Esophageal Phase WFL Pudding Teaspoon -- Pudding Cup -- Honey Teaspoon -- Honey Cup -- Nectar Teaspoon -- Nectar Cup -- Nectar Straw -- Thin Teaspoon -- Thin Cup -- Thin Straw -- Puree -- Mechanical Soft -- Regular -- Multi-consistency -- Pill -- Cervical Esophageal Comment -- No flowsheet data found. Arvil Chaco MA, CCC-SLP Acute Care Speech Language Pathologist  Levi Aland 11/10/2016, 4:26 PM               Time Spent in minutes  30   Lala Lund K M.D on 11/12/2016 at 8:47 AM  Between 7am to 7pm - Pager - [phone]  After 7pm go to [email or web address] - password [facility]  [facility] -  Office  [phone]

## 2016-11-13 LAB — BASIC METABOLIC PANEL
Anion gap: 7 (ref 5–15)
BUN: 15 mg/dL (ref 6–20)
CHLORIDE: 93 mmol/L — AB (ref 101–111)
CO2: 35 mmol/L — ABNORMAL HIGH (ref 22–32)
Calcium: 8.3 mg/dL — ABNORMAL LOW (ref 8.9–10.3)
Creatinine, Ser: 1.29 mg/dL — ABNORMAL HIGH (ref 0.61–1.24)
GFR calc Af Amer: 57 mL/min — ABNORMAL LOW (ref >60)
GFR, EST NON AFRICAN AMERICAN: 50 mL/min — AB (ref >60)
GLUCOSE: 103 mg/dL — AB (ref 65–99)
POTASSIUM: 3.5 mmol/L (ref 3.5–5.1)
Sodium: 135 mmol/L (ref 135–145)

## 2016-11-13 LAB — GLUCOSE, CAPILLARY
GLUCOSE-CAPILLARY: 119 mg/dL — AB (ref 65–99)
GLUCOSE-CAPILLARY: 142 mg/dL — AB (ref 65–99)
GLUCOSE-CAPILLARY: 145 mg/dL — AB (ref 65–99)
Glucose-Capillary: 109 mg/dL — ABNORMAL HIGH (ref 65–99)
Glucose-Capillary: 98 mg/dL (ref 65–99)

## 2016-11-13 LAB — MAGNESIUM: MAGNESIUM: 2.1 mg/dL (ref 1.7–2.4)

## 2016-11-13 NOTE — Progress Notes (Signed)
PROGRESS NOTE                                                                                                                                                                                                             Patient Demographics:    [name], is a 82 y.o. male, DOB - [birth year], [id number]  Admit date - 11/07/2016   Admitting Physician Reubin Milan, MD  Outpatient Primary MD for the patient is [name], MD  LOS - 6  Chief Complaint  Patient presents with  . Shortness of Breath       Brief Narrative   [name] is a 82 y.o. male with medical history significant of chronic iron deficiency anemia, telangiectasia of antrum and jejunum,  paroxysmal atrial fibrillation on Xarelto, chronic diastolic heart failure, 123456 deficiency, COPD, diffuse large B cell lymphoma, HX of disseminated herpes zoster, essential hypertension, hyperlipidemia, GERD, lupus anticoagulant, rheumatoid arthritis, pulmonary embolism > 1 yr ago, was admitted for generalized weakness, A. fib RVR and was found to have severe anemia with hemoglobin of 5.1. His fecal occult blood was positive. He received 2 units of packed RBC transfusion, became quite delirious and confused, currently cardiology is following him for A. fib, GI has been called. We'll also discuss the case with his hematologist.   Subjective:    [name] today In bed, confused, unreliable historian but says he is in no distress. Denies headache chest or abdominal pain.   Assessment  & Plan :     1.Severe symptomatic iron deficiency anemia with history of chronic iron deficiency anemia, telangiectasia of antrum and jejunum,  paroxysmal atrial fibrillation on Xarelto - was Hemoccult positive, received 2 units of packed RBC with stable H&H, continue Zantac, xaralto has been discontinued by cardiology. Agree with it. Monitor H&H. I Discussed his case with  his hematologist as he has underlying lymphoma as well. Continue oral iron supplement. GI following, may need outpt Small Bowel endoscopy.  2. A. fib with RVR. Chronic A. fib, [place] vasc 2 score of greater than 4. Currently on digoxin and Cardizem, cardiology following, further Rx deferred to cardiology, anti-coag held due to recurrent anemia from #1 above by Cards as risks outweigh benefits.  3. Acute on chronic diastolic CHF. EF 55-60%. Diurese with Lasix and monitor. Much improved clinically. -ve 8  lits.  4. Low-grade temperature morning of 11/09/2016, right lower lobe infiltrate. In the setting of delirium he has likely aspirated. Clinically much better and no signs of infection, stop all antibiotics on 11/11/2016. Speech following currently on dysphagia 3 diet.  5. Moderate to severe aortic stenosis. For now have to diurese will continue to monitor. Cardiology on board.  6. BPH. Flomax resumed. DC'd Foley.  7. History of seizures. For now IV Keppra as he is nothing by mouth.  8. COPD. Continue supportive care and no wheezing or acute issues.  9. Delirium. Avoid benzodiazepines, minimize narcotics, much improved and close to baseline on Haldol and Seroquel.   10. Hypokalemia and hypomagnesemia - replaced IV & PO. Will monitor.   Family Communication  :  None  Code Status :  DNR  Diet : DIET DYS 3 Room service appropriate? Yes; Fluid consistency: Thin    Disposition Plan  :  Likely discharge in the morning of 11/14/2016  Consults  :  Cards, GI, Hame ( Tom Keflas over the phone)  Procedures  :     DVT Prophylaxis  :  SCDs    Lab Results  Component Value Date   PLT 86 (L) 11/12/2016    Inpatient Medications  Scheduled Meds: . digoxin  0.125 mg Oral Daily  . diltiazem  180 mg Oral Daily  . famotidine  20 mg Oral BID  . furosemide  40 mg Oral BID  . levETIRAcetam  500 mg Intravenous Q12H  . midodrine  5 mg Oral TID WC  . QUEtiapine  25 mg Oral BID  . sodium chloride  flush  3 mL Intravenous Q12H  . tamsulosin  0.4 mg Oral QHS   Continuous Infusions:  PRN Meds:.acetaminophen, haloperidol lactate, levalbuterol, methocarbamol  Antibiotics  :    Anti-infectives    Start     Dose/Rate Route Frequency Ordered Stop   11/11/16 0800  amoxicillin-clavulanate (AUGMENTIN) 500-125 MG per tablet 500 mg     1 tablet Oral Every 8 hours 11/11/16 0745 11/11/16 2234   11/09/16 1200  ampicillin-sulbactam (UNASYN) 1.5 g in sodium chloride 0.9 % 50 mL IVPB  Status:  Discontinued     1.5 g 100 mL/hr over 30 Minutes Intravenous Every 6 hours 11/09/16 1012 11/11/16 0745         Objective:   Vitals:   11/12/16 1300 11/12/16 1500 11/12/16 2135 11/13/16 0500  BP: (!) 142/105 130/71 116/78 114/64  Pulse: 98 97 (!) 51 64  Resp: (!) 22 19 16 18   Temp:   98.2 F (36.8 C) 98.9 F (37.2 C)  TempSrc:   Axillary Oral  SpO2: 100% 99% 100% 93%  Weight:    80.9 kg (178 lb 5 oz)  Height:        Wt Readings from Last 3 Encounters:  11/13/16 80.9 kg (178 lb 5 oz)  09/15/16 88 kg (194 lb 1.6 oz)  07/26/16 91.5 kg (201 lb 12.8 oz)     Intake/Output Summary (Last 24 hours) at 11/13/16 0947 Last data filed at 11/13/16 0500  Gross per 24 hour  Intake              900 ml  Output              550 ml  Net              350 ml     Physical Exam  Awake , alert, No new F.N deficits, Normal affect [place].AT,PERRAL Supple  Neck,No JVD, No cervical lymphadenopathy appriciated.  Symmetrical Chest wall movement, Good air movement bilaterally, few rales iRRR,No Gallops,Rubs, Positive aortic systolic murmur, No Parasternal Heave +ve B.Sounds, Abd Soft, No tenderness, No organomegaly appriciated, No rebound - guarding or rigidity. No Cyanosis, Clubbing or edema, No new Rash or bruise       Data Review:    CBC  Recent Labs Lab 11/07/16 1440  11/08/16 0417  11/08/16 1758 11/09/16 0223 11/10/16 0512 11/11/16 0954 11/12/16 0537  WBC 8.1  --  5.9  < > 10.5 9.8 7.7 6.0 5.7    HGB 5.1*  < > 7.4*  < > 9.1* 9.3* 9.8* 9.4* 9.8*  HCT 18.8*  < > 24.9*  < > 30.1* 30.6* 33.2* 32.6* 33.1*  PLT 123*  --  91*  < > 95* 91* 78* 87* 86*  MCV 98.4  --  90.9  < > 89.1 89.5 90.7 91.6 90.7  MCH 26.7  --  27.0  < > 26.9 27.2 26.8 26.4 26.8  MCHC 27.1*  --  29.7*  < > 30.2 30.4 29.5* 28.8* 29.6*  RDW 16.1*  --  19.3*  < > 18.9* 18.4* 17.4* 16.6* 16.2*  LYMPHSABS 0.4*  --  0.3*  --   --   --  0.8 0.6*  --   MONOABS 0.6  --  0.3  --   --   --  0.7 0.7  --   EOSABS 0.0  --  0.0  --   --   --  0.0 0.2  --   BASOSABS 0.0  --  0.0  --   --   --  0.0 0.0  --   < > = values in this interval not displayed.  Chemistries   Recent Labs Lab 11/08/16 0417  11/10/16 0512 11/11/16 0425 11/12/16 0537 11/12/16 0922 11/12/16 1545 11/13/16 0738  NA 140  < > 140 138  --  136 139 135  K 4.5  < > 3.0* 2.8*  --  3.1* 3.3* 3.5  CL 107  < > 96* 93*  --  93* 93* 93*  CO2 27  < > 35* 36*  --  35* 37* 35*  GLUCOSE 132*  < > 76 93  --  103* 111* 103*  BUN 20  < > 20 21*  --  14 13 15   CREATININE 1.15  < > 1.15 1.19  --  1.05 1.24 1.29*  CALCIUM 8.0*  < > 8.0* 7.8*  --  8.2* 8.5* 8.3*  MG  --   --  2.0 1.7 1.6*  --  2.6* 2.1  AST 26  --   --   --   --   --   --   --   ALT 13*  --   --   --   --   --   --   --   ALKPHOS 70  --   --   --   --   --   --   --   BILITOT 3.4*  --   --   --   --   --   --   --   < > = values in this interval not displayed. ------------------------------------------------------------------------------------------------------------------ No results for input(s): CHOL, HDL, LDLCALC, TRIG, CHOLHDL, LDLDIRECT in the last 72 hours.  No results found for: HGBA1C ------------------------------------------------------------------------------------------------------------------ No results for input(s): TSH, T4TOTAL, T3FREE, THYROIDAB in the last 72 hours.  Invalid input(s):  FREET3 ------------------------------------------------------------------------------------------------------------------ No results for  input(s): VITAMINB12, FOLATE, FERRITIN, TIBC, IRON, RETICCTPCT in the last 72 hours.  Coagulation profile No results for input(s): INR, PROTIME in the last 168 hours.  No results for input(s): DDIMER in the last 72 hours.  Cardiac Enzymes  Recent Labs Lab 11/07/16 1440 11/07/16 2122 11/08/16 0417  TROPONINI 0.06* 0.06* 0.06*   ------------------------------------------------------------------------------------------------------------------    Component Value Date/Time   BNP 464.0 (H) 11/10/2016 0512    Micro Results Recent Results (from the past 240 hour(s))  MRSA PCR Screening     Status: None   Collection Time: 11/07/16  8:25 PM  Result Value Ref Range Status   MRSA by PCR NEGATIVE NEGATIVE Final    Comment:        The GeneXpert MRSA Assay (FDA approved for NASAL specimens only), is one component of a comprehensive MRSA colonization surveillance program. It is not intended to diagnose MRSA infection nor to guide or monitor treatment for MRSA infections.     Radiology Reports Dg Chest Port 1 View  Result Date: 11/11/2016 CLINICAL DATA:  Shortness of breath today.  Ex-smoker. EXAM: PORTABLE CHEST 1 VIEW COMPARISON:  11/10/2016. FINDINGS: The cardiac silhouette remains borderline enlarged. Stable prominence of the pulmonary vasculature and interstitial markings. Stable right pleural effusion and right basilar airspace opacity. A small left pleural effusion is also stable. The left subclavian porta catheter is unchanged. Unremarkable bones. IMPRESSION: Stable changes of congestive heart failure with right basilar atelectasis or pneumonia. Electronically Signed   By: Claudie Revering M.D.   On: 11/11/2016 08:24   Dg Chest Port 1 View  Result Date: 11/10/2016 CLINICAL DATA:  Shortness of breath, COPD-asthma, acute on chronic CHF, chronic  renal insufficiency stage III. EXAM: PORTABLE CHEST 1 VIEW COMPARISON:  Portable chest x-ray of November 09, 2016. FINDINGS: The left lung is reasonably well inflated. There is persistent volume loss on the right. There is a moderate size right pleural effusion and small left pleural effusion. The pulmonary interstitial markings are diffusely increased. The cardiac silhouette is enlarged. There is mild pulmonary vascular congestion but this has improved since yesterday's study. There is calcification in the wall of the thoracic aorta. The left subclavian venous catheter tip projects over the proximal SVC. IMPRESSION: COPD. CHF with pulmonary edema, slightly improved. Right lower lobe atelectasis or pneumonia with moderate-sized right pleural effusion slightly improved. Trace left pleural effusion. Electronically Signed   By: David  Martinique M.D.   On: 11/10/2016 08:53   Dg Chest Port 1 View  Result Date: 11/09/2016 CLINICAL DATA:  Shortness of breath history of COPD, CHF, aortic stenosis. EXAM: PORTABLE CHEST 1 VIEW COMPARISON:  Portable chest x-ray of November 07, 2016 FINDINGS: The patient is positioned in a lordotic matter similar to yesterday's study. The left lung is well-expanded. The interstitial markings remain increased. On the right there is further volume loss consistent with increased pleural effusion. Confluent density in the inferior aspect of the aerated right lung persists. The cardiac silhouette is enlarged. The pulmonary vascularity is mildly engorged. There is calcification in the wall of the aortic arch. The mediastinum is normal in width. IMPRESSION: Increased volume of pleural fluid on the right since a study of 2 days ago. CHF with pulmonary interstitial edema. COPD with persistent right lower lobe atelectasis or pneumonia. Electronically Signed   By: David  Martinique M.D.   On: 11/09/2016 08:28   Dg Chest Port 1 View  Result Date: 11/07/2016 CLINICAL DATA:  Worsening cough and shortness  of breath. EXAM: PORTABLE CHEST  1 VIEW COMPARISON:  Single-view of the chest 05/28/2016 and 11/28/2015. FINDINGS: There is interstitial pulmonary edema. Moderate right pleural effusion with associated basilar airspace disease is noted. Port-A-Cath is in place. Heart size is upper normal. No pneumothorax. IMPRESSION: Interstitial pulmonary edema with a moderate right pleural effusion and basilar airspace disease which could be atelectasis or pneumonia. Electronically Signed   By: Inge Rise M.D.   On: 11/07/2016 15:08   Dg Swallowing Func-speech Pathology  Result Date: 11/10/2016 Objective Swallowing Evaluation: Type of Study: MBS-Modified Barium Swallow Study Patient Details Name: ARISTEO GRANADE MRN: [id number] Date of Birth: 1931/04/23 Today's Date: 11/10/2016 Time: SLP Start Time (ACUTE ONLY): 1544-SLP Stop Time (ACUTE ONLY): 1556 SLP Time Calculation (min) (ACUTE ONLY): 12 min Past Medical History: Past Medical History: Diagnosis Date . Anemia  . Aortic stenosis, moderate 05/21/2015 . Asthma  . B12 deficiency 07/28/2016 . Cellulitis of leg, left 01/03/2015 . Cervical spondylosis without myelopathy  . Cholelithiasis  . Chronic atrial fibrillation ([place])  . Chronic diastolic heart failure ([place])  . Chronic lower back pain  . Cirrhosis ([place])   Questionable, AFP normal Feb 01/2012, hx of ETOH use  . COPD (chronic obstructive pulmonary disease) ([facility])   Uses occasional nighttime O2 . Diffuse large B cell lymphoma ([place]) 08/22/2015 . Disseminated herpes zoster 2010 . DNR (do not resuscitate) 11/01/2015 . Dysrhythmia   chronic AFib . Essential hypertension, benign  . GERD (gastroesophageal reflux disease)  . Headache(784.0)  . History of chicken pox 1941; 2011 . Iron deficiency anemia 01/18/2012 . Lupus anticoagulant positive  . Mixed hyperlipidemia  . Pulmonary embolus ([place])   2003 and 2011 . Pulmonary nodules   Chest CT 09/11 . Rectal ulcer 12/01/2015 . Rheumatoid arthritis(714.0)  . Seizure disorder ([place]) 09/2015 . Stage  III chronic kidney disease  . 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 BACK SURGERY   . CATARACT EXTRACTION W/ INTRAOCULAR LENS  IMPLANT, BILATERAL   . CHOLECYSTECTOMY  05/23/2012  Procedure: LAPAROSCOPIC CHOLECYSTECTOMY WITH INTRAOPERATIVE CHOLANGIOGRAM;  Surgeon: Stark Klein, MD;  Location: [facility];  Service: General;  Laterality: N/A; . COLONOSCOPY  01/24/2013  [id number] polyps/colonic diverticulosis . COLONOSCOPY N/A 12/01/2015  Procedure: COLONOSCOPY;  Surgeon: Danie Binder, MD;  Location: [facility];  Service: Endoscopy;  Laterality: N/A; . ESOPHAGOGASTRODUODENOSCOPY  02/02/12  [id number] size hiatal hernia; otherwise normal exam . ESOPHAGOGASTRODUODENOSCOPY N/A 11/29/2015  [id number] diverticulosis/small internal hemorrhoids/left colon is redundant . LYMPH NODE BIOPSY   . LYMPH NODE BIOPSY Right 09/11/2015  Procedure: RIGHT INGUINAL LYMPH NODE BIOPSY;  Surgeon: Aviva Signs, MD;  Location: [facility];  Service: General;  Laterality: Right; . MYRINGOTOMY    "3 times; both ears" . POLYPECTOMY  02/02/2012  RMR: Multiple colonic polyps removed, flat, tubular adenomas/ Left-sided diverticulosis . PORTACATH PLACEMENT   . PORTACATH PLACEMENT Left 09/11/2015  Procedure: INSERTION PORT-A-CATH;  Surgeon: Aviva Signs, MD;  Location: [facility];  Service: General;  Laterality: Left; . POSTERIOR LUMBAR VERTEBRAE EXCISION  2003; 2009; 2011 HPI: 82 y.o.malewith medical history significant ofchronic iron deficiency anemia, telangiectasia of antrum and jejunum,paroxysmal atrial fibrillation on Xarelto, chronic diastolic heart failure, 123456 deficiency, COPD, diffuse large B cell lymphoma, HX of disseminated herpes zoster, essential hypertension, hyperlipidemia, GERD, lupus anticoagulant, rheumatoid arthritis, pulmonary embolism in 2000 03/04/2010, seizure disorder, stage III chronic kidney disease who comes to the emergency department with complaints of  progressively worse fatigue, dyspnea and back pain. He has had several episodes of  melena recently. Chest x ray significnat for Increased volume of pleural fluid on the right since a study of 2 days ago. CHF with pulmonary interstitial edema. COPD with persistent right lower lobe atelectasis or pneumonia No Data Recorded Assessment / Plan / Recommendation CHL IP CLINICAL IMPRESSIONS 11/10/2016 Therapy Diagnosis Mild oral phase dysphagia;Mild pharyngeal phase dysphagia Clinical Impression Pt presents with mild oropharyngeal dysphagia. Pt exhibited adequate airway protection throughout the swallow study without aspiration. Trace penetration of thin liquids occured with consecutive large straw sips that was expelled after the swallow. Smaller cup sips of thin liquids prevented penetration. Pt with  prolonged mastication of solid PO secondary to edentulous oral cavity. Recommend dysphagia 3 (mechanical soft) and thin liquids with medicines whole. No further ST needs identified.  Impact on safety and function Mild aspiration risk   CHL IP TREATMENT RECOMMENDATION 11/10/2016 Treatment Recommendations No treatment recommended at this time   Prognosis 11/10/2016 Prognosis for Safe Diet Advancement Good Barriers to Reach Goals -- Barriers/Prognosis Comment -- CHL IP DIET RECOMMENDATION 11/10/2016 SLP Diet Recommendations Thin liquid;Regular solids Liquid Administration via Cup Medication Administration Whole meds with liquid Compensations Slow rate;Small sips/bites Postural Changes Remain semi-upright after after feeds/meals (Comment);Seated upright at 90 degrees   CHL IP OTHER RECOMMENDATIONS 11/10/2016 Recommended Consults -- Oral Care Recommendations Oral care BID Other Recommendations --   CHL IP FOLLOW UP RECOMMENDATIONS 11/10/2016 Follow up Recommendations 24 hour supervision/assistance   CHL IP FREQUENCY AND DURATION 11/09/2016 Speech Therapy Frequency (ACUTE ONLY) min 2x/week Treatment Duration 1 week      CHL IP  ORAL PHASE 11/10/2016 Oral Phase Impaired Oral - Pudding Teaspoon -- Oral - Pudding Cup -- Oral - Honey Teaspoon -- Oral - Honey Cup -- Oral - Nectar Teaspoon -- Oral - Nectar Cup -- Oral - Nectar Straw -- Oral - Thin Teaspoon -- Oral - Thin Cup -- Oral - Thin Straw -- Oral - Puree -- Oral - Mech Soft -- Oral - Regular Impaired mastication;Delayed oral transit;Decreased bolus cohesion Oral - Multi-Consistency -- Oral - Pill Delayed oral transit Oral Phase - Comment --  CHL IP PHARYNGEAL PHASE 11/10/2016 Pharyngeal Phase Impaired Pharyngeal- Pudding Teaspoon -- Pharyngeal -- Pharyngeal- Pudding Cup -- Pharyngeal -- Pharyngeal- Honey Teaspoon -- Pharyngeal -- Pharyngeal- Honey Cup -- Pharyngeal -- Pharyngeal- Nectar Teaspoon -- Pharyngeal -- Pharyngeal- Nectar Cup Delayed swallow initiation-vallecula Pharyngeal -- Pharyngeal- Nectar Straw -- Pharyngeal -- Pharyngeal- Thin Teaspoon -- Pharyngeal -- Pharyngeal- Thin Cup Delayed swallow initiation-vallecula Pharyngeal -- Pharyngeal- Thin Straw Penetration/Aspiration during swallow Pharyngeal Material enters airway, remains ABOVE vocal cords then ejected out Pharyngeal- Puree Delayed swallow initiation-vallecula Pharyngeal -- Pharyngeal- Mechanical Soft -- Pharyngeal -- Pharyngeal- Regular Delayed swallow initiation-vallecula Pharyngeal -- Pharyngeal- Multi-consistency -- Pharyngeal -- Pharyngeal- Pill Delayed swallow initiation-vallecula Pharyngeal -- Pharyngeal Comment --  CHL IP CERVICAL ESOPHAGEAL PHASE 11/10/2016 Cervical Esophageal Phase WFL Pudding Teaspoon -- Pudding Cup -- Honey Teaspoon -- Honey Cup -- Nectar Teaspoon -- Nectar Cup -- Nectar Straw -- Thin Teaspoon -- Thin Cup -- Thin Straw -- Puree -- Mechanical Soft -- Regular -- Multi-consistency -- Pill -- Cervical Esophageal Comment -- No flowsheet data found. Arvil Chaco MA, CCC-SLP Acute Care Speech Language Pathologist  Levi Aland 11/10/2016, 4:26 PM               Time Spent in minutes   30   Lala Lund K M.D on 11/13/2016 at 9:47 AM  Between 7am to 7pm - Pager - [phone]  After 7pm go to [email or web address] -  password [facility]  [facility] -  Office  [phone]

## 2016-11-14 LAB — GLUCOSE, CAPILLARY
GLUCOSE-CAPILLARY: 92 mg/dL (ref 65–99)
Glucose-Capillary: 129 mg/dL — ABNORMAL HIGH (ref 65–99)
Glucose-Capillary: 113 mg/dL — ABNORMAL HIGH (ref 65–99)
Glucose-Capillary: 123 mg/dL — ABNORMAL HIGH (ref 65–99)

## 2016-11-14 LAB — C DIFFICILE QUICK SCREEN W PCR REFLEX
C Diff antigen: POSITIVE — AB
C Diff interpretation: DETECTED
C Diff toxin: POSITIVE — AB

## 2016-11-14 LAB — BASIC METABOLIC PANEL
Anion gap: 9 (ref 5–15)
BUN: 14 mg/dL (ref 6–20)
CHLORIDE: 94 mmol/L — AB (ref 101–111)
CO2: 33 mmol/L — ABNORMAL HIGH (ref 22–32)
Calcium: 8.6 mg/dL — ABNORMAL LOW (ref 8.9–10.3)
Creatinine, Ser: 1.47 mg/dL — ABNORMAL HIGH (ref 0.61–1.24)
GFR calc Af Amer: 49 mL/min — ABNORMAL LOW (ref >60)
GFR calc non Af Amer: 42 mL/min — ABNORMAL LOW (ref >60)
Glucose, Bld: 104 mg/dL — ABNORMAL HIGH (ref 65–99)
POTASSIUM: 3.2 mmol/L — AB (ref 3.5–5.1)
SODIUM: 136 mmol/L (ref 135–145)

## 2016-11-14 LAB — MAGNESIUM: MAGNESIUM: 2 mg/dL (ref 1.7–2.4)

## 2016-11-14 MED ORDER — FUROSEMIDE 40 MG PO TABS
40.0000 mg | ORAL_TABLET | Freq: Every day | ORAL | Status: DC
Start: 2016-11-14 — End: 2016-11-15
  Administered 2016-11-14 – 2016-11-15 (×2): 40 mg via ORAL
  Filled 2016-11-14 (×2): qty 1

## 2016-11-14 MED ORDER — FUROSEMIDE 40 MG PO TABS: 40.0000 mg | ORAL_TABLET | Freq: Two times a day (BID) | ORAL | Status: DC

## 2016-11-14 MED ORDER — DILTIAZEM HCL ER COATED BEADS 240 MG PO CP24
240.0000 mg | ORAL_CAPSULE | Freq: Every day | ORAL | Status: DC
  Administered 2016-11-14 – 2016-11-15 (×2): 240 mg via ORAL
  Filled 2016-11-14 (×2): qty 1

## 2016-11-14 MED ORDER — POTASSIUM CHLORIDE CRYS ER 20 MEQ PO TBCR
40.0000 meq | EXTENDED_RELEASE_TABLET | Freq: Four times a day (QID) | ORAL | Status: AC
  Administered 2016-11-14 (×2): 40 meq via ORAL
  Filled 2016-11-14 (×2): qty 2

## 2016-11-14 MED ORDER — VANCOMYCIN 50 MG/ML ORAL SOLUTION
250.0000 mg | Freq: Four times a day (QID) | ORAL | Status: DC
  Administered 2016-11-14 – 2016-11-15 (×5): 250 mg via ORAL
  Filled 2016-11-14 (×9): qty 5

## 2016-11-14 MED ORDER — LEVETIRACETAM 500 MG PO TABS
500.0000 mg | ORAL_TABLET | Freq: Two times a day (BID) | ORAL | Status: DC
  Administered 2016-11-14 – 2016-11-15 (×3): 500 mg via ORAL
  Filled 2016-11-14 (×3): qty 1

## 2016-11-14 MED ORDER — FUROSEMIDE 10 MG/ML IJ SOLN: 60.0000 mg | Freq: Once | INTRAMUSCULAR | Status: DC

## 2016-11-14 NOTE — Clinical Social Work Placement (Signed)
   CLINICAL SOCIAL WORK PLACEMENT  NOTE  Date:  11/14/2016  Patient Details  Name: [name] MRN: [id number] Date of Birth: 12/04/1931  Clinical Social Work is seeking post-discharge placement for this patient at the [place] level of care (*CSW will initial, date and re-position this form in  chart as items are completed):  Yes   Patient/family provided with [facility] Work Department's list of facilities offering this level of care within the geographic area requested by the patient (or if unable, by the patient's family).  Yes   Patient/family informed of their freedom to choose among providers that offer the needed level of care, that participate in Medicare, Medicaid or managed care program needed by the patient, have an available bed and are willing to accept the patient.  Yes   Patient/family informed of [facility]'s ownership interest in [facility] and [facility], as well as of the fact that they are under no obligation to receive care at these facilities.  PASRR submitted to EDS on       PASRR number received on 11/14/16     Existing PASRR number confirmed on       FL2 transmitted to all facilities in geographic area requested by pt/family on 11/14/16     FL2 transmitted to all facilities within larger geographic area on       Patient informed that his/her managed care company has contracts with or will negotiate with certain facilities, including the following:            Patient/family informed of bed offers received.  Patient chooses bed at       Physician recommends and patient chooses bed at      Patient to be transferred to   on  .  Patient to be transferred to facility by       Patient family notified on   of transfer.  Name of family member notified:        PHYSICIAN       Additional Comment:    _______________________________________________ Ihor Gully, LCSW 11/14/2016, 11:39 AM

## 2016-11-14 NOTE — Progress Notes (Addendum)
PROGRESS NOTE                                                                                                                                                                                                             Patient Demographics:    [name], is a 82 y.o. male, DOB - 09-25-1931, [id number]  Admit date - 11/07/2016   Admitting Physician Reubin Milan, MD  Outpatient Primary MD for the patient is [name], MD  LOS - 7  Chief Complaint  Patient presents with  . Shortness of Breath       Brief Narrative   [name] is a 82 y.o. male with medical history significant of chronic iron deficiency anemia, telangiectasia of antrum and jejunum,  paroxysmal atrial fibrillation on Xarelto, chronic diastolic heart failure, 123456 deficiency, COPD, diffuse large B cell lymphoma, HX of disseminated herpes zoster, essential hypertension, hyperlipidemia, GERD, lupus anticoagulant, rheumatoid arthritis, pulmonary embolism > 1 yr ago, was admitted for generalized weakness, A. fib RVR and was found to have severe anemia with hemoglobin of 5.1. His fecal occult blood was positive. He received 2 units of packed RBC transfusion, became quite delirious and confused, currently cardiology is following him for A. fib, GI has been called. We'll also discuss the case with his hematologist.   Subjective:    [name] today In bed, confused, unreliable historian but says he is in no distress. Denies headache chest or abdominal pain.   Assessment  & Plan :     1.Severe symptomatic iron deficiency anemia with history of chronic iron deficiency anemia, telangiectasia of antrum and jejunum,  paroxysmal atrial fibrillation on Xarelto - was Hemoccult positive, received 2 units of packed RBC with stable H&H, continue Zantac, xaralto has been discontinued by cardiology. Agree with it. Monitor H&H. I Discussed his case with  his hematologist as he has underlying lymphoma as well. Continue oral iron supplement. GI following, may need outpt Small Bowel endoscopy.  2. A. fib with RVR. Chronic A. fib, [place] vasc 2 score of greater than 4. Currently on digoxin and Cardizem, cardiology following, further Rx deferred to cardiology, anti-coag held due to recurrent anemia from #1 above by Cards as risks outweigh benefits.  3. Acute on chronic diastolic CHF. EF 55-60%. Diuresed with Lasix and monitor. Much improved clinically. -ve 7.2  lits.  4. Low-grade temperature morning of 11/09/2016, right lower lobe infiltrate. In the setting of delirium he has likely aspirated. Clinically much better and no signs of infection, stop all antibiotics on 11/11/2016. Speech following currently on dysphagia 3 diet.  5. Moderate to severe aortic stenosis. For now have to diurese will continue to monitor. Cardiology on board.  6. BPH. Flomax resumed. DC'd Foley.  7. History of seizures. For now IV Keppra as he is nothing by mouth.  8. COPD. Continue supportive care and no wheezing or acute issues.  9. Delirium. Avoid benzodiazepines, minimize narcotics, much improved and close to baseline on Haldol and Seroquel.   10. Hypokalemia and hypomagnesemia - replaced PO. Will monitor.  11. Gen. weakness and deconditioning. PT eval may require SNF. He had previously refused but now agreeable.  12. C Diff - place on Vanco.     Family Communication  :  None  Code Status :  DNR  Diet : DIET DYS 3 Room service appropriate? Yes; Fluid consistency: Thin    Disposition Plan  :  SNF once bed is arranged  Consults  :  Cards, GI, Hame ( Tom Keflas over the phone)  Procedures  :     DVT Prophylaxis  :  SCDs    Lab Results  Component Value Date   PLT 86 (L) 11/12/2016    Inpatient Medications  Scheduled Meds: . digoxin  0.125 mg Oral Daily  . diltiazem  240 mg Oral Daily  . famotidine  20 mg Oral BID  . [START ON 11/15/2016]  furosemide  40 mg Oral BID  . levETIRAcetam  500 mg Intravenous Q12H  . midodrine  5 mg Oral TID WC  . potassium chloride  40 mEq Oral Q6H  . QUEtiapine  25 mg Oral BID  . sodium chloride flush  3 mL Intravenous Q12H  . tamsulosin  0.4 mg Oral QHS   Continuous Infusions:  PRN Meds:.acetaminophen, haloperidol lactate, levalbuterol, methocarbamol  Antibiotics  :    Anti-infectives    Start     Dose/Rate Route Frequency Ordered Stop   11/11/16 0800  amoxicillin-clavulanate (AUGMENTIN) 500-125 MG per tablet 500 mg     1 tablet Oral Every 8 hours 11/11/16 0745 11/11/16 2234   11/09/16 1200  ampicillin-sulbactam (UNASYN) 1.5 g in sodium chloride 0.9 % 50 mL IVPB  Status:  Discontinued     1.5 g 100 mL/hr over 30 Minutes Intravenous Every 6 hours 11/09/16 1012 11/11/16 0745         Objective:   Vitals:   11/13/16 1400 11/13/16 2020 11/13/16 2323 11/14/16 0440  BP: 119/63  (!) 126/50 114/86  Pulse: 79  (!) 103 (!) 117  Resp: 18  16 18   Temp: 98.6 F (37 C)  97.4 F (36.3 C) 97.7 F (36.5 C)  TempSrc:   Oral Oral  SpO2: 94% 95% 96% 93%  Weight:    81.9 kg (180 lb 9.6 oz)  Height:        Wt Readings from Last 3 Encounters:  11/14/16 81.9 kg (180 lb 9.6 oz)  09/15/16 88 kg (194 lb 1.6 oz)  07/26/16 91.5 kg (201 lb 12.8 oz)     Intake/Output Summary (Last 24 hours) at 11/14/16 0906 Last data filed at 11/14/16 0543  Gross per 24 hour  Intake              583 ml  Output  0 ml  Net              583 ml     Physical Exam  Awake , alert, No new F.N deficits, Normal affect [place].AT,PERRAL Supple Neck,No JVD, No cervical lymphadenopathy appriciated.  Symmetrical Chest wall movement, Good air movement bilaterally, few rales iRRR,No Gallops,Rubs, Positive aortic systolic murmur, No Parasternal Heave +ve B.Sounds, Abd Soft, No tenderness, No organomegaly appriciated, No rebound - guarding or rigidity. No Cyanosis, Clubbing or edema, No new Rash or bruise        Data Review:    CBC  Recent Labs Lab 11/07/16 1440  11/08/16 0417  11/08/16 1758 11/09/16 0223 11/10/16 0512 11/11/16 0954 11/12/16 0537  WBC 8.1  --  5.9  < > 10.5 9.8 7.7 6.0 5.7  HGB 5.1*  < > 7.4*  < > 9.1* 9.3* 9.8* 9.4* 9.8*  HCT 18.8*  < > 24.9*  < > 30.1* 30.6* 33.2* 32.6* 33.1*  PLT 123*  --  91*  < > 95* 91* 78* 87* 86*  MCV 98.4  --  90.9  < > 89.1 89.5 90.7 91.6 90.7  MCH 26.7  --  27.0  < > 26.9 27.2 26.8 26.4 26.8  MCHC 27.1*  --  29.7*  < > 30.2 30.4 29.5* 28.8* 29.6*  RDW 16.1*  --  19.3*  < > 18.9* 18.4* 17.4* 16.6* 16.2*  LYMPHSABS 0.4*  --  0.3*  --   --   --  0.8 0.6*  --   MONOABS 0.6  --  0.3  --   --   --  0.7 0.7  --   EOSABS 0.0  --  0.0  --   --   --  0.0 0.2  --   BASOSABS 0.0  --  0.0  --   --   --  0.0 0.0  --   < > = values in this interval not displayed.  Chemistries   Recent Labs Lab 11/08/16 0417  11/11/16 0425 11/12/16 0537 11/12/16 0922 11/12/16 1545 11/13/16 0738 11/14/16 0650  NA 140  < > 138  --  136 139 135 136  K 4.5  < > 2.8*  --  3.1* 3.3* 3.5 3.2*  CL 107  < > 93*  --  93* 93* 93* 94*  CO2 27  < > 36*  --  35* 37* 35* 33*  GLUCOSE 132*  < > 93  --  103* 111* 103* 104*  BUN 20  < > 21*  --  14 13 15 14   CREATININE 1.15  < > 1.19  --  1.05 1.24 1.29* 1.47*  CALCIUM 8.0*  < > 7.8*  --  8.2* 8.5* 8.3* 8.6*  MG  --   < > 1.7 1.6*  --  2.6* 2.1 2.0  AST 26  --   --   --   --   --   --   --   ALT 13*  --   --   --   --   --   --   --   ALKPHOS 70  --   --   --   --   --   --   --   BILITOT 3.4*  --   --   --   --   --   --   --   < > = values in this interval not displayed. ------------------------------------------------------------------------------------------------------------------ No results for input(s): CHOL, HDL, LDLCALC, TRIG,  CHOLHDL, LDLDIRECT in the last 72 hours.  No results found for: HGBA1C ------------------------------------------------------------------------------------------------------------------ No  results for input(s): TSH, T4TOTAL, T3FREE, THYROIDAB in the last 72 hours.  Invalid input(s): FREET3 ------------------------------------------------------------------------------------------------------------------ No results for input(s): VITAMINB12, FOLATE, FERRITIN, TIBC, IRON, RETICCTPCT in the last 72 hours.  Coagulation profile No results for input(s): INR, PROTIME in the last 168 hours.  No results for input(s): DDIMER in the last 72 hours.  Cardiac Enzymes  Recent Labs Lab 11/07/16 1440 11/07/16 2122 11/08/16 0417  TROPONINI 0.06* 0.06* 0.06*   ------------------------------------------------------------------------------------------------------------------    Component Value Date/Time   BNP 464.0 (H) 11/10/2016 0512    Micro Results Recent Results (from the past 240 hour(s))  MRSA PCR Screening     Status: None   Collection Time: 11/07/16  8:25 PM  Result Value Ref Range Status   MRSA by PCR NEGATIVE NEGATIVE Final    Comment:        The GeneXpert MRSA Assay (FDA approved for NASAL specimens only), is one component of a comprehensive MRSA colonization surveillance program. It is not intended to diagnose MRSA infection nor to guide or monitor treatment for MRSA infections.     Radiology Reports Dg Chest Port 1 View  Result Date: 11/11/2016 CLINICAL DATA:  Shortness of breath today.  Ex-smoker. EXAM: PORTABLE CHEST 1 VIEW COMPARISON:  11/10/2016. FINDINGS: The cardiac silhouette remains borderline enlarged. Stable prominence of the pulmonary vasculature and interstitial markings. Stable right pleural effusion and right basilar airspace opacity. A small left pleural effusion is also stable. The left subclavian porta catheter is unchanged. Unremarkable bones. IMPRESSION: Stable changes of congestive heart failure with right basilar atelectasis or pneumonia. Electronically Signed   By: Claudie Revering M.D.   On: 11/11/2016 08:24   Dg Chest Port 1 View  Result  Date: 11/10/2016 CLINICAL DATA:  Shortness of breath, COPD-asthma, acute on chronic CHF, chronic renal insufficiency stage III. EXAM: PORTABLE CHEST 1 VIEW COMPARISON:  Portable chest x-ray of November 09, 2016. FINDINGS: The left lung is reasonably well inflated. There is persistent volume loss on the right. There is a moderate size right pleural effusion and small left pleural effusion. The pulmonary interstitial markings are diffusely increased. The cardiac silhouette is enlarged. There is mild pulmonary vascular congestion but this has improved since yesterday's study. There is calcification in the wall of the thoracic aorta. The left subclavian venous catheter tip projects over the proximal SVC. IMPRESSION: COPD. CHF with pulmonary edema, slightly improved. Right lower lobe atelectasis or pneumonia with moderate-sized right pleural effusion slightly improved. Trace left pleural effusion. Electronically Signed   By: David  Martinique M.D.   On: 11/10/2016 08:53   Dg Chest Port 1 View  Result Date: 11/09/2016 CLINICAL DATA:  Shortness of breath history of COPD, CHF, aortic stenosis. EXAM: PORTABLE CHEST 1 VIEW COMPARISON:  Portable chest x-ray of November 07, 2016 FINDINGS: The patient is positioned in a lordotic matter similar to yesterday's study. The left lung is well-expanded. The interstitial markings remain increased. On the right there is further volume loss consistent with increased pleural effusion. Confluent density in the inferior aspect of the aerated right lung persists. The cardiac silhouette is enlarged. The pulmonary vascularity is mildly engorged. There is calcification in the wall of the aortic arch. The mediastinum is normal in width. IMPRESSION: Increased volume of pleural fluid on the right since a study of 2 days ago. CHF with pulmonary interstitial edema. COPD with persistent right lower lobe atelectasis or pneumonia. Electronically Signed  By: David  Martinique M.D.   On: 11/09/2016 08:28     Dg Chest Port 1 View  Result Date: 11/07/2016 CLINICAL DATA:  Worsening cough and shortness of breath. EXAM: PORTABLE CHEST 1 VIEW COMPARISON:  Single-view of the chest 05/28/2016 and 11/28/2015. FINDINGS: There is interstitial pulmonary edema. Moderate right pleural effusion with associated basilar airspace disease is noted. Port-A-Cath is in place. Heart size is upper normal. No pneumothorax. IMPRESSION: Interstitial pulmonary edema with a moderate right pleural effusion and basilar airspace disease which could be atelectasis or pneumonia. Electronically Signed   By: Inge Rise M.D.   On: 11/07/2016 15:08   Dg Swallowing Func-speech Pathology  Result Date: 11/10/2016 Objective Swallowing Evaluation: Type of Study: MBS-Modified Barium Swallow Study Patient Details Name: ROMIN FREESE MRN: [id number] Date of Birth: Apr 21, 1931 Today's Date: 11/10/2016 Time: SLP Start Time (ACUTE ONLY): 1544-SLP Stop Time (ACUTE ONLY): 1556 SLP Time Calculation (min) (ACUTE ONLY): 12 min Past Medical History: Past Medical History: Diagnosis Date . Anemia  . Aortic stenosis, moderate 05/21/2015 . Asthma  . B12 deficiency 07/28/2016 . Cellulitis of leg, left 01/03/2015 . Cervical spondylosis without myelopathy  . Cholelithiasis  . Chronic atrial fibrillation ([place])  . Chronic diastolic heart failure ([place])  . Chronic lower back pain  . Cirrhosis ([place])   Questionable, AFP normal Feb 01/2012, hx of ETOH use  . COPD (chronic obstructive pulmonary disease) ([facility])   Uses occasional nighttime O2 . Diffuse large B cell lymphoma ([place]) 08/22/2015 . Disseminated herpes zoster 2010 . DNR (do not resuscitate) 11/01/2015 . Dysrhythmia   chronic AFib . Essential hypertension, benign  . GERD (gastroesophageal reflux disease)  . Headache(784.0)  . History of chicken pox 1941; 2011 . Iron deficiency anemia 01/18/2012 . Lupus anticoagulant positive  . Mixed hyperlipidemia  . Pulmonary embolus ([place])   2003 and 2011 . Pulmonary nodules   Chest CT  09/11 . Rectal ulcer 12/01/2015 . Rheumatoid arthritis(714.0)  . Seizure disorder ([place]) 09/2015 . Stage III chronic kidney disease  . 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 BACK SURGERY   . CATARACT EXTRACTION W/ INTRAOCULAR LENS  IMPLANT, BILATERAL   . CHOLECYSTECTOMY  05/23/2012  Procedure: LAPAROSCOPIC CHOLECYSTECTOMY WITH INTRAOPERATIVE CHOLANGIOGRAM;  Surgeon: Stark Klein, MD;  Location: [facility];  Service: General;  Laterality: N/A; . COLONOSCOPY  01/24/2013  [id number] polyps/colonic diverticulosis . COLONOSCOPY N/A 12/01/2015  Procedure: COLONOSCOPY;  Surgeon: Danie Binder, MD;  [facility];  Service: Endoscopy;  Laterality: N/A; . ESOPHAGOGASTRODUODENOSCOPY  02/02/12  [id number] size hiatal hernia; otherwise normal exam . ESOPHAGOGASTRODUODENOSCOPY N/A 11/29/2015  [id number] diverticulosis/small internal hemorrhoids/left colon is redundant . LYMPH NODE BIOPSY   . LYMPH NODE BIOPSY Right 09/11/2015  Procedure: RIGHT INGUINAL LYMPH NODE BIOPSY;  Surgeon: Aviva Signs, MD;  Location: [facility];  Service: General;  Laterality: Right; . MYRINGOTOMY    "3 times; both ears" . POLYPECTOMY  02/02/2012  RMR: Multiple colonic polyps removed, flat, tubular adenomas/ Left-sided diverticulosis . PORTACATH PLACEMENT   . PORTACATH PLACEMENT Left 09/11/2015  Procedure: INSERTION PORT-A-CATH;  Surgeon: Aviva Signs, MD;  Location: [facility];  Service: General;  Laterality: Left; . POSTERIOR LUMBAR VERTEBRAE EXCISION  2003; 2009; 2011 HPI: 82 y.o.malewith medical history significant ofchronic iron deficiency anemia, telangiectasia of antrum and jejunum,paroxysmal atrial fibrillation on Xarelto, chronic diastolic heart failure, 123456 deficiency, COPD, diffuse large B cell lymphoma, HX of disseminated herpes zoster, essential hypertension, hyperlipidemia, GERD, lupus anticoagulant, rheumatoid arthritis,  pulmonary embolism in 2000 03/04/2010,  seizure disorder, stage III chronic kidney disease who comes to the emergency department with complaints of progressively worse fatigue, dyspnea and back pain. He has had several episodes of melena recently. Chest x ray significnat for Increased volume of pleural fluid on the right since a study of 2 days ago. CHF with pulmonary interstitial edema. COPD with persistent right lower lobe atelectasis or pneumonia No Data Recorded Assessment / Plan / Recommendation CHL IP CLINICAL IMPRESSIONS 11/10/2016 Therapy Diagnosis Mild oral phase dysphagia;Mild pharyngeal phase dysphagia Clinical Impression Pt presents with mild oropharyngeal dysphagia. Pt exhibited adequate airway protection throughout the swallow study without aspiration. Trace penetration of thin liquids occured with consecutive large straw sips that was expelled after the swallow. Smaller cup sips of thin liquids prevented penetration. Pt with  prolonged mastication of solid PO secondary to edentulous oral cavity. Recommend dysphagia 3 (mechanical soft) and thin liquids with medicines whole. No further ST needs identified.  Impact on safety and function Mild aspiration risk   CHL IP TREATMENT RECOMMENDATION 11/10/2016 Treatment Recommendations No treatment recommended at this time   Prognosis 11/10/2016 Prognosis for Safe Diet Advancement Good Barriers to Reach Goals -- Barriers/Prognosis Comment -- CHL IP DIET RECOMMENDATION 11/10/2016 SLP Diet Recommendations Thin liquid;Regular solids Liquid Administration via Cup Medication Administration Whole meds with liquid Compensations Slow rate;Small sips/bites Postural Changes Remain semi-upright after after feeds/meals (Comment);Seated upright at 90 degrees   CHL IP OTHER RECOMMENDATIONS 11/10/2016 Recommended Consults -- Oral Care Recommendations Oral care BID Other Recommendations --   CHL IP FOLLOW UP RECOMMENDATIONS 11/10/2016 Follow up Recommendations 24 hour supervision/assistance   CHL IP FREQUENCY AND  DURATION 11/09/2016 Speech Therapy Frequency (ACUTE ONLY) min 2x/week Treatment Duration 1 week      CHL IP ORAL PHASE 11/10/2016 Oral Phase Impaired Oral - Pudding Teaspoon -- Oral - Pudding Cup -- Oral - Honey Teaspoon -- Oral - Honey Cup -- Oral - Nectar Teaspoon -- Oral - Nectar Cup -- Oral - Nectar Straw -- Oral - Thin Teaspoon -- Oral - Thin Cup -- Oral - Thin Straw -- Oral - Puree -- Oral - Mech Soft -- Oral - Regular Impaired mastication;Delayed oral transit;Decreased bolus cohesion Oral - Multi-Consistency -- Oral - Pill Delayed oral transit Oral Phase - Comment --  CHL IP PHARYNGEAL PHASE 11/10/2016 Pharyngeal Phase Impaired Pharyngeal- Pudding Teaspoon -- Pharyngeal -- Pharyngeal- Pudding Cup -- Pharyngeal -- Pharyngeal- Honey Teaspoon -- Pharyngeal -- Pharyngeal- Honey Cup -- Pharyngeal -- Pharyngeal- Nectar Teaspoon -- Pharyngeal -- Pharyngeal- Nectar Cup Delayed swallow initiation-vallecula Pharyngeal -- Pharyngeal- Nectar Straw -- Pharyngeal -- Pharyngeal- Thin Teaspoon -- Pharyngeal -- Pharyngeal- Thin Cup Delayed swallow initiation-vallecula Pharyngeal -- Pharyngeal- Thin Straw Penetration/Aspiration during swallow Pharyngeal Material enters airway, remains ABOVE vocal cords then ejected out Pharyngeal- Puree Delayed swallow initiation-vallecula Pharyngeal -- Pharyngeal- Mechanical Soft -- Pharyngeal -- Pharyngeal- Regular Delayed swallow initiation-vallecula Pharyngeal -- Pharyngeal- Multi-consistency -- Pharyngeal -- Pharyngeal- Pill Delayed swallow initiation-vallecula Pharyngeal -- Pharyngeal Comment --  CHL IP CERVICAL ESOPHAGEAL PHASE 11/10/2016 Cervical Esophageal Phase WFL Pudding Teaspoon -- Pudding Cup -- Honey Teaspoon -- Honey Cup -- Nectar Teaspoon -- Nectar Cup -- Nectar Straw -- Thin Teaspoon -- Thin Cup -- Thin Straw -- Puree -- Mechanical Soft -- Regular -- Multi-consistency -- Pill -- Cervical Esophageal Comment -- No flowsheet data found. Arvil Chaco MA, [facility]  Speech Language Pathologist  Levi Aland 11/10/2016, 4:26 PM  Time Spent in minutes  30   Kagan Hietpas K M.D on 11/14/2016 at 9:06 AM  Between 7am to 7pm - Pager - [phone]  After 7pm go to [email or web address] - password [facility]  [facility] -  Office  [phone]

## 2016-11-14 NOTE — Progress Notes (Signed)
Physical Therapy Treatment Patient Details Name: [name] MRN: [id number] DOB: 1931-05-16 Today's Date: 11/14/2016    History of Present Illness 82 y.o. male with medical history significant of chronic iron deficiency anemia, telangiectasia of antrum and jejunum,  paroxysmal atrial fibrillation on Xarelto, chronic diastolic heart failure, 123456 deficiency, COPD, diffuse large B cell lymphoma, HX of disseminated herpes zoster, essential hypertension, hyperlipidemia, GERD, lupus anticoagulant, rheumatoid arthritis, pulmonary embolism > 1 yr ago, was admitted for generalized weakness, A. fib RVR and was found to have severe anemia with hemoglobin of 5.1. His fecal occult blood was positive. He received 2 units of packed RBC transfusion, became quite delirious and confused, currently cardiology is following him for A. fib, GI has been called. We'll also discuss the case with his hematologist.  dx: Afib with RVR    PT Comments    Pt received in bed, and was agreeable to PT tx.  Pt expressed that he is not able to do a whole lot because of all of the back surgeries he has had.  Pt expressed that he wants to get a back brace.  PT expressed that will have to be ordered by his physician.  Pt continues to require assistance for all functional mobility tasks.  He required Mod A for supine<>sit, and Min guard for SPT bed<>chair with cane.  Further gait not assessed today due to continued poor quality of transfer.  Pt is unable to come into full upright standing position at this time.  He is recommended for SNF, and expressed today, that he is willing to go.    Follow Up Recommendations  SNF;Supervision/Assistance - 24 hour (Pt expressed he is now willing to go to SNF. )     Equipment Recommendations  None recommended by PT    Recommendations for Other Services       Precautions / Restrictions Precautions Precautions: Fall Precaution Comments: due to recent immobility Restrictions Weight Bearing  Restrictions: No    Mobility  Bed Mobility Overal bed mobility: Needs Assistance Bed Mobility: Supine to Sit     Supine to sit: Min assist;Mod assist;HOB elevated (increased time, pt used cane to assist with pushing trunk up into seated position.  Pt educated not to remain in sidelying position to conserve energy.  )        Transfers Overall transfer level: Needs assistance Equipment used: Straight cane Transfers: Stand Pivot Transfers   Stand pivot transfers: Min guard          Ambulation/Gait                 Stairs            Wheelchair Mobility    Modified Rankin (Stroke Patients Only)       Balance Overall balance assessment: Needs assistance Sitting-balance support: Bilateral upper extremity supported;Feet supported Sitting balance-Leahy Scale: Fair     Standing balance support: Single extremity supported Standing balance-Leahy Scale: Poor                      Cognition Arousal/Alertness: Awake/alert Behavior During Therapy: WFL for tasks assessed/performed                        Exercises      General Comments        Pertinent Vitals/Pain Pain Assessment: 0-10 Pain Score: 9  Pain Location: back pain Pain Descriptors / Indicators: Aching Pain Intervention(s): Limited activity within patient's tolerance;Repositioned;Monitored during  session    Home Living                      Prior Function            PT Goals (current goals can now be found in the care plan section) Acute Rehab PT Goals Patient Stated Goal: To go home PT Goal Formulation: With patient/family Time For Goal Achievement: 11/25/16 Potential to Achieve Goals: Fair Progress towards PT goals: Progressing toward goals    Frequency    Min 3X/week      PT Plan Frequency needs to be updated    Co-evaluation             End of Session Equipment Utilized During Treatment: Gait belt Activity Tolerance: Patient tolerated  treatment well Patient left: in chair;with call bell/phone within reach;with nursing/sitter in room     Time: 0845-0908 PT Time Calculation (min) (ACUTE ONLY): 23 min  Charges:  $Therapeutic Activity: 8-22 mins                    G Codes:      Beth Dany Harten, PT, DPT X: [phone]

## 2016-11-14 NOTE — Clinical Social Work Note (Signed)
Patient has made the decision to go to SNF.      Sarahy Creedon, Clydene Pugh, LCSW

## 2016-11-14 NOTE — NC FL2 (Signed)
[place] LEVEL OF CARE SCREENING TOOL     IDENTIFICATION  Patient Name: [name] Birthdate: [birth year] Sex: male Admission Date (Current Location): 11/07/2016  [place] and [place] Number:  [id number] [place] and Address:         Provider Number: [id number]  Attending Physician Name and Address:  Thurnell Lose, MD  Relative Name and Phone Number:       Current Level of Care: Hospital Recommended Level of Care: [place] Prior Approval Number:    Date Approved/Denied:   PASRR Number: [id number]  Discharge Plan: SNF    Current Diagnoses: Patient Active Problem List   Diagnosis Date Noted  . Melena   . Symptomatic anemia 11/07/2016  . Atrial fibrillation with rapid ventricular response ([place]) 11/07/2016  . Folate deficiency 09/01/2016  . B12 deficiency 07/28/2016  . Rectal ulcer 12/02/2015  . Telangiectasia 12/02/2015  . Syncope and collapse 11/28/2015  . AKI (acute kidney injury) ([place]) 11/28/2015  . Dehydration 11/28/2015  . History of pulmonary embolism 11/28/2015  . Elevated troponin I level 11/28/2015  . Normocytic anemia 11/28/2015  . GI bleed 11/28/2015  . Epigastric abdominal pain 11/28/2015  . DNR (do not resuscitate) 11/01/2015  . Back pain   . Status post thoracentesis   . Altered mental status   . Altered mental state 10/18/2015  . Acute encephalopathy 10/18/2015  . Chronic respiratory failure with hypoxia ([place]) 10/18/2015  . Leukocytosis 10/18/2015  . Seroma 10/18/2015  . Cellulitis of groin 10/18/2015  . Malignant pleural effusion   . Chronic respiratory failure ([place]) 09/17/2015  . Recurrent right pleural effusion   . Chronic diastolic CHF (congestive heart failure) ([place])   . HCAP (healthcare-associated pneumonia) 09/13/2015  . COPD with acute exacerbation ([place]) 09/13/2015  . Atrial fibrillation with RVR ([place])   . Acute respiratory distress 08/22/2015  . Diffuse large B cell lymphoma ([place])  08/22/2015  . Chronic a-fib ([place]) 08/22/2015  . BPH (benign prostatic hyperplasia) 08/22/2015  . Acute diastolic CHF (congestive heart failure) ([place])   . Aortic stenosis, moderate 05/21/2015  . Acute on chronic diastolic CHF (congestive heart failure) ([place]) 05/19/2015  . COPD exacerbation ([place]) 05/19/2015  . Acute kidney injury ([place]) 01/05/2015  . Thrombocytopenia ([place]) 01/05/2015  . Stage III chronic kidney disease 01/05/2015  . Cellulitis of leg, left 01/03/2015  . Stiffness of joints, not elsewhere classified, multiple sites 01/28/2014  . Posture imbalance 01/28/2014  . Headache(784.0) 10/21/2013  . Cervical spondylosis without myelopathy 10/21/2013  . Chronic diastolic heart failure ([place]) 07/16/2013  . Adenomatous polyps 01/11/2013  . Anemia 10/13/2012  . COPD (chronic obstructive pulmonary disease) ([place]) 05/21/2012  . Long term current use of anticoagulant 05/21/2012  . Arthritis 05/21/2012  . Iron deficiency anemia 01/18/2012  . Lupus anticoagulant positive 01/18/2012  . GERD (gastroesophageal reflux disease) 01/18/2012  . Cirrhosis ([place]) 01/18/2012    Class: Question of  . Essential hypertension, benign 11/02/2010  . PULMONARY EMBOLISM 11/02/2010  . Chronic atrial fibrillation ([place]) 11/02/2010  . Rheumatoid arthritis ([place]) 11/02/2010    Orientation RESPIRATION BLADDER Height & Weight     Self, Situation, Place  Normal Continent Weight: 180 lb 9.6 oz (81.9 kg) Height:  5\' 8"  (172.7 cm)  BEHAVIORAL SYMPTOMS/MOOD NEUROLOGICAL BOWEL NUTRITION STATUS      Continent Diet (DYS 3)  AMBULATORY STATUS COMMUNICATION OF NEEDS Skin   Limited Assist Verbally Normal  Personal Care Assistance Level of Assistance  Bathing, Feeding, Dressing Bathing Assistance: Limited assistance Feeding assistance: Independent Dressing Assistance: Limited assistance     Functional Limitations Info  Sight, Speech, Hearing Sight Info: Adequate Hearing Info:  Adequate Speech Info: Adequate    SPECIAL CARE FACTORS FREQUENCY  PT (By licensed PT)     PT Frequency: 5x/week              Contractures      Additional Factors Info  Allergies, Psychotropic, Code Status, Isolation Precautions Code Status Info: DNR Allergies Info: Cymbalta, Procaine Hcl, Bystolic Psychotropic Info: Neurontin   Isolation Precautions Info: 11/28/15 mrsa by pcr Enteric precautions     Current Medications (11/14/2016):  This is the current hospital active medication list Current Facility-Administered Medications  Medication Dose Route Frequency Provider Last Rate Last Dose  . acetaminophen (TYLENOL) tablet 650 mg  650 mg Oral Q6H PRN Thurnell Lose, MD   650 mg at 11/13/16 1602  . digoxin (LANOXIN) tablet 0.125 mg  0.125 mg Oral Daily Satira Sark, MD   0.125 mg at 11/13/16 0936  . diltiazem (CARDIZEM CD) 24 hr capsule 240 mg  240 mg Oral Daily Thurnell Lose, MD      . famotidine (PEPCID) tablet 20 mg  20 mg Oral BID Thurnell Lose, MD   20 mg at 11/13/16 2155  . [START ON 11/15/2016] furosemide (LASIX) tablet 40 mg  40 mg Oral BID Thurnell Lose, MD      . haloperidol lactate (HALDOL) injection 5 mg  5 mg Intravenous Q6H PRN Thurnell Lose, MD   Stopped at 11/12/16 2119  . levalbuterol (XOPENEX) nebulizer solution 0.63 mg  0.63 mg Nebulization Q6H PRN Reubin Milan, MD   0.63 mg at 11/08/16 2359  . levETIRAcetam (KEPPRA) tablet 500 mg  500 mg Oral BID Thurnell Lose, MD      . methocarbamol (ROBAXIN) tablet 500 mg  500 mg Oral Q8H PRN Thurnell Lose, MD   500 mg at 11/13/16 1602  . midodrine (PROAMATINE) tablet 5 mg  5 mg Oral TID WC Thurnell Lose, MD   5 mg at 11/14/16 0903  . potassium chloride SA (K-DUR,KLOR-CON) CR tablet 40 mEq  40 mEq Oral Q6H Thurnell Lose, MD   40 mEq at 11/14/16 0903  . QUEtiapine (SEROQUEL) tablet 25 mg  25 mg Oral BID Thurnell Lose, MD   25 mg at 11/13/16 2156  . sodium chloride flush (NS) 0.9 %  injection 3 mL  3 mL Intravenous Q12H Reubin Milan, MD   3 mL at 11/13/16 2155  . tamsulosin (FLOMAX) capsule 0.4 mg  0.4 mg Oral QHS Thurnell Lose, MD   0.4 mg at 11/13/16 2156   Facility-Administered Medications Ordered in Other Encounters  Medication Dose Route Frequency Provider Last Rate Last Dose  . heparin lock flush 100 unit/mL  500 Units Intravenous Once Baird Cancer, PA-C      . sodium chloride flush (NS) 0.9 % injection 10 mL  10 mL Intravenous PRN Baird Cancer, PA-C         Discharge Medications: Please see discharge summary for a list of discharge medications.  Relevant Imaging Results:  Relevant Lab Results:   Additional Information [id number]  Ihor Gully, LCSW

## 2016-11-14 NOTE — Progress Notes (Signed)
Patient Name: [name] Date of Encounter: 11/14/2016  Primary Cardiologist: Dr. Aloha Gell [facility] Problem List     Principal Problem:   Atrial fibrillation with rapid ventricular response [facility]) Active Problems:   Essential hypertension, benign   COPD (chronic obstructive pulmonary disease) (HCC)   Stage III chronic kidney disease   Acute on chronic diastolic CHF (congestive heart failure) (HCC)   Aortic stenosis, moderate   Diffuse large B cell lymphoma (HCC)   Recurrent right pleural effusion   Chronic respiratory failure with hypoxia ([place])   DNR (do not resuscitate)   Telangiectasia   Symptomatic anemia   Melena    Subjective   No complaints at present. He had some left shoulder pain earlier. No complaints of dyspnea or palpitations.   Inpatient Medications    Scheduled Meds: . digoxin  0.125 mg Oral Daily  . diltiazem  240 mg Oral Daily  . famotidine  20 mg Oral BID  . [START ON 11/15/2016] furosemide  40 mg Oral BID  . levETIRAcetam  500 mg Intravenous Q12H  . midodrine  5 mg Oral TID WC  . potassium chloride  40 mEq Oral Q6H  . QUEtiapine  25 mg Oral BID  . sodium chloride flush  3 mL Intravenous Q12H  . tamsulosin  0.4 mg Oral QHS    PRN Meds: acetaminophen, haloperidol lactate, levalbuterol, methocarbamol   Vital Signs    Vitals:   11/13/16 1400 11/13/16 2020 11/13/16 2323 11/14/16 0440  BP: 119/63  (!) 126/50 114/86  Pulse: 79  (!) 103 (!) 117  Resp: 18  16 18   Temp: 98.6 F (37 C)  97.4 F (36.3 C) 97.7 F (36.5 C)  TempSrc:   Oral Oral  SpO2: 94% 95% 96% 93%  Weight:    180 lb 9.6 oz (81.9 kg)  Height:        Intake/Output Summary (Last 24 hours) at 11/14/16 0902 Last data filed at 11/14/16 0543  Gross per 24 hour  Intake              583 ml  Output                0 ml  Net              583 ml   Filed Weights   11/12/16 0400 11/13/16 0500 11/14/16 0440  Weight: 181 lb 14.1 oz (82.5 kg) 178 lb 5 oz (80.9 kg) 180  lb 9.6 oz (81.9 kg)    Physical Exam   Gen.: Chronically ill-appearing bearded male. HEENT: Conjunctiva and lids normal, oropharynx clear. Neck: Supple, no elevated JVP or carotid bruits, no thyromegaly. Lungs: Clear, no wheezes, or rales. No coughing.  Cardiac: Irregularly irregular, no S3, 3/6 systolic murmur consistent with aortic stenosis, no pericardial rub. Abdomen: Soft, nontender, bowel sounds present. Extremities: No pitting edema, distal pulses 2+.  Labs    CBC  Recent Labs  11/11/16 0954 11/12/16 0537  WBC 6.0 5.7  NEUTROABS 4.5  --   HGB 9.4* 9.8*  HCT 32.6* 33.1*  MCV 91.6 90.7  PLT 87* 86*   Basic Metabolic Panel  Recent Labs  11/13/16 0738 11/14/16 0650  NA 135 136  K 3.5 3.2*  CL 93* 94*  CO2 35* 33*  GLUCOSE 103* 104*  BUN 15 14  CREATININE 1.29* 1.47*  CALCIUM 8.3* 8.6*  MG 2.1 2.0    Telemetry    Atrial fibrillation. Some episodes of  rapid HR, up to 136 bpm.   ECG    I personally reviewed the tracing from 11/07/2016 which showed atrial fibrillation with RVR, right bundle branch block, and left anterior fascicular block.  Radiology    No results found.  Cardiac Studies   Echocardiogram 05/20/2015: Study Conclusions  - Left ventricle: The cavity size was normal. Wall thickness was   increased in a pattern of mild LVH. Systolic function was   vigorous. The estimated ejection fraction was in the range of 65%   to 70%. Wall motion was normal; there were no regional wall   motion abnormalities. The study is not technically sufficient to   allow evaluation of LV diastolic function. - Aortic valve: There was moderate to severe stenosis. Mean   gradient (S): 23 mm Hg. Peak gradient (S): 54 mm Hg. VTI ratio of   LVOT to aortic valve: 0.34. Valve area (VTI): 1.07 cm^2. Valve   area (Vmax): 1.08 cm^2. Valve area (Vmean): 1.21 cm^2. - Mitral valve: Calcified annulus. Mildly calcified leaflets . Mean   gradient (D): 4 mm Hg. Valve area by  pressure half-time: 2.24   cm^2. Valve area by continuity equation (using LVOT flow): 1.67   cm^2. - Left atrium: The atrium was moderately dilated. - Right atrium: Central venous pressure (est): 3 mm Hg. - Atrial septum: There was increased thickness of the septum,   consistent with lipomatous hypertrophy. No defect or patent   foramen ovale was identified. - Tricuspid valve: There was trivial regurgitation. - Pulmonary arteries: Systolic pressure could not be accurately   estimated. - Pericardium, extracardiac: There was no pericardial effusion.  Impressions:  - Mild LVH with LVEF 65-70%, indeterminate diastolic function.   Moderate left atrial enlargement. Moderate to severe calcific   aortic stenosis as outlined above - there has been progression   compared to the prior study. Trivial tricuspid regurgitation,   unable to assess PASP.  Patient Profile     82 year old male with severe aortic stenosis, chronic atrial fibrillation, COPD, stage 3 kidney disease, and diffuse large cell lymphoma with recurrent right pleural effusion, and symptomatic anemia with GI bleed in the setting of GI telangiectasias. He has been taken off anticoagulation. Focusing on heart rate control of atrial fibrillation with medication adjustments. Do not plan to aggressively pursue invasive management of aortic stenosis. Patient currently DO NOT RESUSCITATE.  Assessment & Plan    1. Chronic atrial fibrillation with RVR in the setting of comorbid illnesses. Currently on Cardizem CD 240 mg daily  Increased over the weekend, has ben transitioned to po digoxin 0.125 mg daily. BP is stable.  Xarelto has been stopped the setting of GI bleed. Do not plan to resume anticoagulation.  2. Moderate to severe calcific aortic stenosis based on echocardiogram done back in May of last year. Per discussion with patient in past he has preferred a conservative approach, and at this point in light of declining health with  multiple comorbidities, he would not be a candidate for surgical repair or TAVR.  3. 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  4. Recurrent right pleural effusion, moderate in size with trace left pleural effusion per CXR on 11/11/2016.  5. CKD, stage 3, stable creatinine 1.47. Potassium 3.2 this am being replaced by po per PCP. Marland Kitchen   6. Acute on chronic diastolic heart failure in the setting of severe anemia, rapid atrial fibrillation, and aortic stenosis. He has now been transitioned to po lasix  40 mg BID. He has diuresed 7.2 liters since admission and is comfortable with evidence of decompensation.   Jory Sims, NP 11/14/2016  Attending note:  Patient seen and examined. Reviewed current medications and vital signs. Mr. Hanish states he has had some recent shoulder pain, no chest pain or breathlessness at rest. Heart rate has been increased in atrial fibrillation, Cardizem CD was increased to 240 mg daily and he continues on Lanoxin. He had a substantial diuresis with IV Lasix, now on oral regimen. Creatinine has bumped up to 1.4. From a cardiac perspective would continue conservative medical therapy as previously outlined. Decrease Lasix to 40 mg once daily. Replete potassium.  Satira Sark, M.D., F.A.C.C.

## 2016-11-15 ENCOUNTER — Other Ambulatory Visit (HOSPITAL_COMMUNITY): Payer: Medicare Other

## 2016-11-15 ENCOUNTER — Ambulatory Visit (HOSPITAL_COMMUNITY): Payer: Medicare Other | Admitting: Hematology & Oncology

## 2016-11-15 ENCOUNTER — Telehealth: Payer: Self-pay | Admitting: Gastroenterology

## 2016-11-15 LAB — BASIC METABOLIC PANEL
ANION GAP: 8 (ref 5–15)
BUN: 14 mg/dL (ref 6–20)
CALCIUM: 8.7 mg/dL — AB (ref 8.9–10.3)
CHLORIDE: 98 mmol/L — AB (ref 101–111)
CO2: 33 mmol/L — AB (ref 22–32)
CREATININE: 1.49 mg/dL — AB (ref 0.61–1.24)
GFR calc non Af Amer: 42 mL/min — ABNORMAL LOW (ref >60)
GFR, EST AFRICAN AMERICAN: 48 mL/min — AB (ref >60)
GLUCOSE: 97 mg/dL (ref 65–99)
Potassium: 3.7 mmol/L (ref 3.5–5.1)
Sodium: 139 mmol/L (ref 135–145)

## 2016-11-15 LAB — GLUCOSE, CAPILLARY
GLUCOSE-CAPILLARY: 101 mg/dL — AB (ref 65–99)
GLUCOSE-CAPILLARY: 114 mg/dL — AB (ref 65–99)

## 2016-11-15 LAB — MAGNESIUM: Magnesium: 2 mg/dL (ref 1.7–2.4)

## 2016-11-15 MED ORDER — FUROSEMIDE 40 MG PO TABS: 40.0000 mg | ORAL_TABLET | Freq: Two times a day (BID) | ORAL | Status: AC

## 2016-11-15 MED ORDER — OXYCODONE HCL 10 MG PO TABS: 10.0000 mg | ORAL_TABLET | Freq: Four times a day (QID) | ORAL | 0 refills | Status: DC | PRN

## 2016-11-15 MED ORDER — POTASSIUM CHLORIDE CRYS ER 20 MEQ PO TBCR: 20.0000 meq | EXTENDED_RELEASE_TABLET | Freq: Every day | ORAL | Status: AC

## 2016-11-15 MED ORDER — VANCOMYCIN 50 MG/ML ORAL SOLUTION: 250.0000 mg | Freq: Four times a day (QID) | ORAL | Status: DC

## 2016-11-15 MED ORDER — LEVALBUTEROL HCL 0.63 MG/3ML IN NEBU: 0.6300 mg | INHALATION_SOLUTION | Freq: Four times a day (QID) | RESPIRATORY_TRACT | 0 refills | Status: AC | PRN

## 2016-11-15 MED ORDER — DILTIAZEM HCL ER COATED BEADS 240 MG PO CP24: 240.0000 mg | ORAL_CAPSULE | Freq: Every day | ORAL | Status: AC

## 2016-11-15 MED ORDER — QUETIAPINE FUMARATE 25 MG PO TABS: 25.0000 mg | ORAL_TABLET | Freq: Two times a day (BID) | ORAL | Status: AC

## 2016-11-15 NOTE — Clinical Social Work Placement (Signed)
   CLINICAL SOCIAL WORK PLACEMENT  NOTE  Date:  11/15/2016  Patient Details  Name: [name] MRN: [id number] Date of Birth: Jun 07, 1931  Clinical Social Work is seeking post-discharge placement for this patient at the [place] level of care (*CSW will initial, date and re-position this form in  chart as items are completed):  Yes   Patient/family provided with [facility] Work Department's list of facilities offering this level of care within the geographic area requested by the patient (or if unable, by the patient's family).  Yes   Patient/family informed of their freedom to choose among providers that offer the needed level of care, that participate in Medicare, Medicaid or managed care program needed by the patient, have an available bed and are willing to accept the patient.  Yes   Patient/family informed of [facility]'s ownership interest in [facility] and [facility], as well as of the fact that they are under no obligation to receive care at these facilities.  PASRR submitted to EDS on       PASRR number received on 11/14/16     Existing PASRR number confirmed on       FL2 transmitted to all facilities in geographic area requested by pt/family on 11/14/16     FL2 transmitted to all facilities within larger geographic area on       Patient informed that his/her managed care company has contracts with or will negotiate with certain facilities, including the following:        Yes   Patient/family informed of bed offers received.  Patient [facility] bed at [facility]     Physician recommends and patient chooses bed at      Patient to be transferred to [facility] on 11/15/16.  Patient to be transferred to facility by [name], dtr     Patient family notified on 11/15/16 of transfer.  Name of family member notified:  [name], dtr     PHYSICIAN       Additional Comment:     _______________________________________________ Ihor Gully, LCSW 11/15/2016, 9:58 AM

## 2016-11-15 NOTE — Discharge Summary (Signed)
[name] [id number] DOB: Nov 18, 1931 DOA: 11/07/2016  PCP: [name], MD  Admit date: 11/07/2016  Discharge date: 11/15/2016  Admitted From: Home   Disposition:  SNF   Recommendations for Outpatient Follow-up:   Follow up with PCP in 1-2 weeks  PCP Please obtain BMP/CBC, 2 view CXR in 1week,  (see Discharge instructions)   PCP Please follow up on the following pending results:None   Home Health: None   Equipment/Devices: None  Consultations: GI, Surgery, Cards, Haematology Discharge Condition: Fair   CODE STATUS: DNR   Diet Recommendation: DIET DYS 3     Chief Complaint  Patient presents with  . Shortness of Breath     Brief history of present illness from the day of admission and additional interim summary    Tony T Hodgeis a 82 y.o.malewith medical history significant ofchronic iron deficiency anemia, telangiectasia of antrum and jejunum,paroxysmal atrial fibrillation on Xarelto, chronic diastolic heart failure, 123456 deficiency, COPD, diffuse large B cell lymphoma, HX of disseminated herpes zoster, essential hypertension, hyperlipidemia, GERD, lupus anticoagulant, rheumatoid arthritis, pulmonary embolism > 1 yr ago, was admitted for generalized weakness, A. fib RVR and was found to have severe anemia with hemoglobin of 5.1. His fecal occult blood was positive. He received 2 units of packed RBC transfusion, became quite delirious and confused, currently cardiology is following him for A. fib, GI has been called. We'll also discuss the case with his hematologist.  Hospital issues addressed     1.Severe symptomatic iron deficiency anemia with history of chronic iron deficiency anemia, telangiectasia of antrum and jejunum,paroxysmal atrial fibrillation on Xarelto - was Hemoccult  positive, received 2 units of packed RBC with stable H&H, continue Zantac, xaralto has been discontinued by cardiology. Agree with it. Monitor H&H. I Discussed his case with his hematologist as he has underlying lymphoma as well. Continue oral iron supplement. Seen by GI in the hospital, may need outpt Small Bowel endoscopy, he should follow with recommended gastroenterologist in 1-2 weeks post discharge.  2. A. fib with RVR. Chronic A. fib, [place] vasc 2 score of greater than 4. Currently on digoxin and Cardizem, cardiology following, further Rx deferred to cardiology, anti-coag held due to recurrent anemia from #1 above by Cards as risks outweigh benefits.  3. Acute on chronic diastolic CHF. EF 55-60%. Diuresed with Lasix and monitor. Much improved clinically. -ve 7 lits. We will place on 40 mg of twice a day Lasix orally, monitor weight, BMP and diuretic dose closely at SNF.  4. Low-grade temperature morning of 11/09/2016, right lower lobe infiltrate. In the setting of delirium he has likely aspirated. Clinically much better and no signs of infection, stop all antibiotics on 11/11/2016. Speech following currently on dysphagia 3 diet.  5. Moderate to severe aortic stenosis. For now have to diurese will continue to monitor. Cardiology all the patient here, patient should follow with cardiology post discharge as well as in 1-2 weeks.  6. BPH. Flomax resumed.    7. History of seizures. continue home dose Keppra.  8. COPD. Continue supportive care and no wheezing or acute issues. Nebulizer treatments as needed.  9. Delirium. Avoid benzodiazepines, minimize narcotics, much improved and at baseline on Seroquel.   10. Hypokalemia and hypomagnesemia - replaced and stable, recheck in 3-4 days at [facility].  11. Gen. weakness and deconditioning. PT eval may require SNF. He had previously refused but now agreeable.  12. C Diff - placed on oral Vanco for 5 more days, his diarrhea has completely  resolved.  13. Mild ARF. Lasix dose has been reduced, creatinine has stabilized, continue diuretics with caution, monitor renal function and diuretic dose closely at SNF.   Discharge diagnosis     Principal Problem:   Atrial fibrillation with rapid ventricular response (HCC) Active Problems:   Essential hypertension, benign   COPD (chronic obstructive pulmonary disease) (HCC)   Stage III chronic kidney disease   Acute on chronic diastolic CHF (congestive heart failure) (HCC)   Aortic stenosis, moderate   Diffuse large B cell lymphoma (HCC)   Recurrent right pleural effusion   Chronic respiratory failure with hypoxia (HCC)   DNR (do not resuscitate)   Telangiectasia   Symptomatic anemia   Melena    Discharge instructions    Discharge Instructions    Discharge instructions    Complete by:  As directed    Follow with Primary MD [name], MD in 3-4 days   Get CBC, CMP, 2 view Chest X ray checked  by SNF MD in 3-4 days ( we routinely change or add medications that can affect your baseline labs and fluid status, therefore we recommend that you get the mentioned basic workup next visit with your PCP, your PCP may decide not to get them or add new tests based on their clinical decision)   Activity: As tolerated with Full fall precautions use walker/cane & assistance as needed   Disposition SNF   Diet:   DIET DYS 3 with feeding assistance and aspiration precautions.  Check your Weight same time everyday, if you gain over 2 pounds, or you develop in leg swelling, experience more shortness of breath or chest pain, call your Primary MD immediately. Follow Cardiac Low Salt Diet and 1.5 lit/day fluid restriction.   On your next visit with your primary care physician please Get Medicines reviewed and adjusted.   Please request your Prim.MD to go over all Hospital Tests and Procedure/Radiological results at the follow up, please get all Hospital records sent to your Prim  MD by signing hospital release before you go home.   If you experience worsening of your admission symptoms, develop shortness of breath, life threatening emergency, suicidal or homicidal thoughts you must seek medical attention immediately by calling 911 or calling your MD immediately  if symptoms less severe.  You Must read complete instructions/literature along with all the possible adverse reactions/side effects for all the Medicines you take and that have been prescribed to you. Take any new Medicines after you have completely understood and accpet all the possible adverse reactions/side effects.   Do not drive, operate heavy machinery, perform activities at heights, swimming or participation in water activities or provide baby sitting services if your were admitted for syncope or siezures until you have seen by Primary MD or a Neurologist and advised to do so again.  Do not drive when taking Pain medications.    Do not take more than prescribed Pain, Sleep and Anxiety Medications  Special Instructions: If you have smoked or chewed Tobacco  in the  last 2 yrs please stop smoking, stop any regular Alcohol  and or any Recreational drug use.  Wear Seat belts while driving.   Please note  You were cared for by a hospitalist during your hospital stay. If you have any questions about your discharge medications or the care you received while you were in the hospital after you are discharged, you can call the unit and asked to speak with the hospitalist on call if the hospitalist that took care of you is not available. Once you are discharged, your primary care physician will handle any further medical issues. Please note that NO REFILLS for any discharge medications will be authorized once you are discharged, as it is imperative that you return to your primary care physician (or establish a relationship with a primary care physician if you do not have one) for your aftercare needs so that they can  reassess your need for medications and monitor your lab values.   Increase activity slowly    Complete by:  As directed       Discharge Medications     Medication List    STOP taking these medications   cetirizine 10 MG tablet Commonly known as:  ZYRTEC   diltiazem 120 MG tablet Commonly known as:  CARDIZEM   diphenhydrAMINE 25 MG tablet Commonly known as:  BENADRYL   rivaroxaban 20 MG Tabs tablet Commonly known as:  XARELTO     TAKE these medications   albuterol 108 (90 Base) MCG/ACT inhaler Commonly known as:  PROAIR HFA Inhale 2 puffs into the lungs every 6 (six) hours as needed. Shortness of Breath   allopurinol 300 MG tablet Commonly known as:  ZYLOPRIM Take 300 mg by mouth daily.   DIGOX 0.125 MG tablet Generic drug:  digoxin TAKE ONE TABLET BY MOUTH DAILY   diltiazem 240 MG 24 hr capsule Commonly known as:  CARDIZEM CD Take 1 capsule (240 mg total) by mouth daily.   ferrous sulfate 325 (65 FE) MG tablet Take 325 mg by mouth daily.   Fish Oil 1000 MG Caps Take 2 capsules by mouth 2 (two) times daily.   fluticasone 50 MCG/ACT nasal spray Commonly known as:  FLONASE Place 2 sprays into both nostrils daily.   folic acid 1 MG tablet Commonly known as:  FOLVITE Take 1 tablet (1 mg total) by mouth daily.   furosemide 40 MG tablet Commonly known as:  LASIX Take 1 tablet (40 mg total) by mouth 2 (two) times daily. Take daily for prevention of fluid buildup. What changed:  when to take this  reasons to take this   gabapentin 300 MG capsule Commonly known as:  NEURONTIN Take 1 capsule (300 mg total) by mouth 2 (two) times daily.   ipratropium-albuterol 0.5-2.5 (3) MG/3ML Soln Commonly known as:  DUONEB Take 3 mLs by nebulization every 6 (six) hours as needed. What changed:  reasons to take this   levalbuterol 0.63 MG/3ML nebulizer solution Commonly known as:  XOPENEX Take 3 mLs (0.63 mg total) by nebulization every 6 (six) hours as needed for  wheezing or shortness of breath.   levETIRAcetam 500 MG tablet Commonly known as:  KEPPRA Take 1 tablet (500 mg total) by mouth 2 (two) times daily.   lidocaine-prilocaine cream Commonly known as:  EMLA Apply a quarter size amount to port site 1 hour prior to chemo. Do not rub in. Cover with plastic wrap.   methocarbamol 500 MG tablet Commonly known as:  ROBAXIN Take 1  tablet (500 mg total) by mouth every 8 (eight) hours as needed for muscle spasms.   omeprazole 40 MG capsule Commonly known as:  PRILOSEC Take 1 capsule (40 mg total) by mouth 2 (two) times daily.   Oxycodone HCl 10 MG Tabs Take 1 tablet (10 mg total) by mouth every 6 (six) hours as needed.   potassium chloride SA 20 MEQ tablet Commonly known as:  K-DUR,KLOR-CON Take 1 tablet (20 mEq total) by mouth daily. Starting 08/27/15. What changed:  how much to take  when to take this  reasons to take this   predniSONE 10 MG tablet Commonly known as:  DELTASONE Take 10 mg by mouth daily with breakfast.   QUEtiapine 25 MG tablet Commonly known as:  SEROQUEL Take 1 tablet (25 mg total) by mouth 2 (two) times daily.   tadalafil 5 MG tablet Commonly known as:  CIALIS Take 5 mg by mouth daily as needed for erectile dysfunction.   tamsulosin 0.4 MG Caps capsule Commonly known as:  FLOMAX Take 1 capsule (0.4 mg total) by mouth at bedtime. For prostate treatment.   vancomycin 50 mg/mL oral solution Commonly known as:  VANCOCIN Take 5 mLs (250 mg total) by mouth every 6 (six) hours.   Vitamin D (Ergocalciferol) 50000 units Caps capsule Commonly known as:  DRISDOL Take 1 capsule by mouth once a week. Reported on 07/05/2016            Durable Medical Equipment        Start     Ordered   11/13/16 0948  For home use only DME Walker rolling  Once    Comments:  5 wheel  Question:  Patient needs a walker to treat with the following condition  Answer:  General weakness   11/13/16 0947   11/11/16 1346  For home  use only DME Hospital bed  Once    Question Answer Comment  Patient has (list medical condition): CHF   The above medical condition requires: Patient requires the ability to reposition frequently   Head must be elevated greater than: 30 degrees   Bed type Semi-electric      11/11/16 1345      Follow-up Information    [name], MD. Schedule an appointment as soon as possible for a visit in 1 week(s).   Specialty:  Family Medicine Contact information: [place] [phone]        Manus Rudd, MD. Schedule an appointment as soon as possible for a visit in 1 week(s).   Specialty:  Gastroenterology Contact information: [address] [place] [address] [place], PA-C. Schedule an appointment as soon as possible for a visit in 1 week(s).   Specialty:  Oncology Contact information: [place] [address] [phone]        Carlyle Dolly, MD. Schedule an appointment as soon as possible for a visit in 1 week(s).   Specialty:  Cardiology Contact information: [address] [place] [address] [phone]           Major procedures and Radiology Reports - PLEASE review detailed and final reports thoroughly  -     TTE  Moderate LVH with LVEF 60-65%. Indeterminate diastolic function   in the setting of atrial fibrillation. Moderate left atrial   enlargement. Moderately calcified mitral annulus with calcified   mitral leaflets and trivial mitral regurgitation. Severe calcific   aortic stenosis as outlined  above. Trivial tricuspid   regurgitation.    Dg Chest Port 1 View  Result Date: 11/11/2016 CLINICAL DATA:  Shortness of breath today.  Ex-smoker. EXAM: PORTABLE CHEST 1 VIEW COMPARISON:  11/10/2016. FINDINGS: The cardiac silhouette remains borderline enlarged. Stable prominence of the pulmonary vasculature and interstitial markings. Stable right pleural effusion and  right basilar airspace opacity. A small left pleural effusion is also stable. The left subclavian porta catheter is unchanged. Unremarkable bones. IMPRESSION: Stable changes of congestive heart failure with right basilar atelectasis or pneumonia. Electronically Signed   By: Claudie Revering M.D.   On: 11/11/2016 08:24   Dg Chest Port 1 View  Result Date: 11/10/2016 CLINICAL DATA:  Shortness of breath, COPD-asthma, acute on chronic CHF, chronic renal insufficiency stage III. EXAM: PORTABLE CHEST 1 VIEW COMPARISON:  Portable chest x-ray of November 09, 2016. FINDINGS: The left lung is reasonably well inflated. There is persistent volume loss on the right. There is a moderate size right pleural effusion and small left pleural effusion. The pulmonary interstitial markings are diffusely increased. The cardiac silhouette is enlarged. There is mild pulmonary vascular congestion but this has improved since yesterday's study. There is calcification in the wall of the thoracic aorta. The left subclavian venous catheter tip projects over the proximal SVC. IMPRESSION: COPD. CHF with pulmonary edema, slightly improved. Right lower lobe atelectasis or pneumonia with moderate-sized right pleural effusion slightly improved. Trace left pleural effusion. Electronically Signed   By: David  Martinique M.D.   On: 11/10/2016 08:53   Dg Chest Port 1 View  Result Date: 11/09/2016 CLINICAL DATA:  Shortness of breath history of COPD, CHF, aortic stenosis. EXAM: PORTABLE CHEST 1 VIEW COMPARISON:  Portable chest x-ray of November 07, 2016 FINDINGS: The patient is positioned in a lordotic matter similar to yesterday's study. The left lung is well-expanded. The interstitial markings remain increased. On the right there is further volume loss consistent with increased pleural effusion. Confluent density in the inferior aspect of the aerated right lung persists. The cardiac silhouette is enlarged. The pulmonary vascularity is mildly engorged.  There is calcification in the wall of the aortic arch. The mediastinum is normal in width. IMPRESSION: Increased volume of pleural fluid on the right since a study of 2 days ago. CHF with pulmonary interstitial edema. COPD with persistent right lower lobe atelectasis or pneumonia. Electronically Signed   By: David  Martinique M.D.   On: 11/09/2016 08:28   Dg Chest Port 1 View  Result Date: 11/07/2016 CLINICAL DATA:  Worsening cough and shortness of breath. EXAM: PORTABLE CHEST 1 VIEW COMPARISON:  Single-view of the chest 05/28/2016 and 11/28/2015. FINDINGS: There is interstitial pulmonary edema. Moderate right pleural effusion with associated basilar airspace disease is noted. Port-A-Cath is in place. Heart size is upper normal. No pneumothorax. IMPRESSION: Interstitial pulmonary edema with a moderate right pleural effusion and basilar airspace disease which could be atelectasis or pneumonia. Electronically Signed   By: Inge Rise M.D.   On: 11/07/2016 15:08   Dg Swallowing Func-speech Pathology  Result Date: 11/10/2016 Objective Swallowing Evaluation: Type of Study: MBS-Modified Barium Swallow Study Patient Details Name: ALMANDO VITTITOW MRN: [id number] Date of Birth: 09/30/1931 Today's Date: 11/10/2016 Time: SLP Start Time (ACUTE ONLY): 1544-SLP Stop Time (ACUTE ONLY): 1556 SLP Time Calculation (min) (ACUTE ONLY): 12 min Past Medical History: Past Medical History: Diagnosis Date . Anemia  . Aortic stenosis, moderate 05/21/2015 . Asthma  . B12 deficiency 07/28/2016 . Cellulitis of leg, left 01/03/2015 . Cervical spondylosis  without myelopathy  . Cholelithiasis  . Chronic atrial fibrillation ([place])  . Chronic diastolic heart failure ([place])  . Chronic lower back pain  . Cirrhosis ([place])   Questionable, AFP normal Feb 01/2012, hx of ETOH use  . COPD (chronic obstructive pulmonary disease) (HCC)   Uses occasional nighttime O2 . Diffuse large B cell lymphoma ([place]) 08/22/2015 . Disseminated herpes zoster 2010 . DNR (do not  resuscitate) 11/01/2015 . Dysrhythmia   chronic AFib . Essential hypertension, benign  . GERD (gastroesophageal reflux disease)  . Headache(784.0)  . History of chicken pox 1941; 2011 . Iron deficiency anemia 01/18/2012 . Lupus anticoagulant positive  . Mixed hyperlipidemia  . Pulmonary embolus ([place])   2003 and 2011 . Pulmonary nodules   Chest CT 09/11 . Rectal ulcer 12/01/2015 . Rheumatoid arthritis(714.0)  . Seizure disorder ([place]) 09/2015 . Stage III chronic kidney disease  . 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 BACK SURGERY   . CATARACT EXTRACTION W/ INTRAOCULAR LENS  IMPLANT, BILATERAL   . CHOLECYSTECTOMY  05/23/2012  Procedure: LAPAROSCOPIC CHOLECYSTECTOMY WITH INTRAOPERATIVE CHOLANGIOGRAM;  Surgeon: Stark Klein, MD;  Location: [employer];  Service: General;  Laterality: N/A; . COLONOSCOPY  01/24/2013  [id number] polyps/colonic diverticulosis . COLONOSCOPY N/A 12/01/2015  Procedure: COLONOSCOPY;  Surgeon: Danie Binder, MD;  Location[facility];  Service: Endoscopy;  Laterality: N/A; . ESOPHAGOGASTRODUODENOSCOPY  02/02/12  [id number] size hiatal hernia; otherwise normal exam . ESOPHAGOGASTRODUODENOSCOPY N/A 11/29/2015  [id number] diverticulosis/small internal hemorrhoids/left colon is redundant . LYMPH NODE BIOPSY   . LYMPH NODE BIOPSY Right 09/11/2015  Procedure: RIGHT INGUINAL LYMPH NODE BIOPSY;  Surgeon: Aviva Signs, MD;  Location: [facility];  Service: General;  Laterality: Right; . MYRINGOTOMY    "3 times; both ears" . POLYPECTOMY  02/02/2012  RMR: Multiple colonic polyps removed, flat, tubular adenomas/ Left-sided diverticulosis . PORTACATH PLACEMENT   . PORTACATH PLACEMENT Left 09/11/2015  Procedure: INSERTION PORT-A-CATH;  Surgeon: Aviva Signs, MD;  Location: [facility];  Service: General;  Laterality: Left; . POSTERIOR LUMBAR VERTEBRAE EXCISION  2003; 2009; 2011 HPI: 82 y.o.malewith medical history significant ofchronic iron  deficiency anemia, telangiectasia of antrum and jejunum,paroxysmal atrial fibrillation on Xarelto, chronic diastolic heart failure, 123456 deficiency, COPD, diffuse large B cell lymphoma, HX of disseminated herpes zoster, essential hypertension, hyperlipidemia, GERD, lupus anticoagulant, rheumatoid arthritis, pulmonary embolism in 2000 03/04/2010, seizure disorder, stage III chronic kidney disease who comes to the emergency department with complaints of progressively worse fatigue, dyspnea and back pain. He has had several episodes of melena recently. Chest x ray significnat for Increased volume of pleural fluid on the right since a study of 2 days ago. CHF with pulmonary interstitial edema. COPD with persistent right lower lobe atelectasis or pneumonia No Data Recorded Assessment / Plan / Recommendation CHL IP CLINICAL IMPRESSIONS 11/10/2016 Therapy Diagnosis Mild oral phase dysphagia;Mild pharyngeal phase dysphagia Clinical Impression Pt presents with mild oropharyngeal dysphagia. Pt exhibited adequate airway protection throughout the swallow study without aspiration. Trace penetration of thin liquids occured with consecutive large straw sips that was expelled after the swallow. Smaller cup sips of thin liquids prevented penetration. Pt with  prolonged mastication of solid PO secondary to edentulous oral cavity. Recommend dysphagia 3 (mechanical soft) and thin liquids with medicines whole. No further ST needs identified.  Impact on safety and function Mild aspiration risk   CHL IP TREATMENT RECOMMENDATION 11/10/2016 Treatment Recommendations No treatment recommended at this time   Prognosis 11/10/2016 Prognosis  for Safe Diet Advancement Good Barriers to Reach Goals -- Barriers/Prognosis Comment -- CHL IP DIET RECOMMENDATION 11/10/2016 SLP Diet Recommendations Thin liquid;Regular solids Liquid Administration via Cup Medication Administration Whole meds with liquid Compensations Slow rate;Small sips/bites Postural  Changes Remain semi-upright after after feeds/meals (Comment);Seated upright at 90 degrees   CHL IP OTHER RECOMMENDATIONS 11/10/2016 Recommended Consults -- Oral Care Recommendations Oral care BID Other Recommendations --   CHL IP FOLLOW UP RECOMMENDATIONS 11/10/2016 Follow up Recommendations 24 hour supervision/assistance   CHL IP FREQUENCY AND DURATION 11/09/2016 Speech Therapy Frequency (ACUTE ONLY) min 2x/week Treatment Duration 1 week      CHL IP ORAL PHASE 11/10/2016 Oral Phase Impaired Oral - Pudding Teaspoon -- Oral - Pudding Cup -- Oral - Honey Teaspoon -- Oral - Honey Cup -- Oral - Nectar Teaspoon -- Oral - Nectar Cup -- Oral - Nectar Straw -- Oral - Thin Teaspoon -- Oral - Thin Cup -- Oral - Thin Straw -- Oral - Puree -- Oral - Mech Soft -- Oral - Regular Impaired mastication;Delayed oral transit;Decreased bolus cohesion Oral - Multi-Consistency -- Oral - Pill Delayed oral transit Oral Phase - Comment --  CHL IP PHARYNGEAL PHASE 11/10/2016 Pharyngeal Phase Impaired Pharyngeal- Pudding Teaspoon -- Pharyngeal -- Pharyngeal- Pudding Cup -- Pharyngeal -- Pharyngeal- Honey Teaspoon -- Pharyngeal -- Pharyngeal- Honey Cup -- Pharyngeal -- Pharyngeal- Nectar Teaspoon -- Pharyngeal -- Pharyngeal- Nectar Cup Delayed swallow initiation-vallecula Pharyngeal -- Pharyngeal- Nectar Straw -- Pharyngeal -- Pharyngeal- Thin Teaspoon -- Pharyngeal -- Pharyngeal- Thin Cup Delayed swallow initiation-vallecula Pharyngeal -- Pharyngeal- Thin Straw Penetration/Aspiration during swallow Pharyngeal Material enters airway, remains ABOVE vocal cords then ejected out Pharyngeal- Puree Delayed swallow initiation-vallecula Pharyngeal -- Pharyngeal- Mechanical Soft -- Pharyngeal -- Pharyngeal- Regular Delayed swallow initiation-vallecula Pharyngeal -- Pharyngeal- Multi-consistency -- Pharyngeal -- Pharyngeal- Pill Delayed swallow initiation-vallecula Pharyngeal -- Pharyngeal Comment --  CHL IP CERVICAL ESOPHAGEAL PHASE 11/10/2016  Cervical Esophageal Phase WFL Pudding Teaspoon -- Pudding Cup -- Honey Teaspoon -- Honey Cup -- Nectar Teaspoon -- Nectar Cup -- Nectar Straw -- Thin Teaspoon -- Thin Cup -- Thin Straw -- Puree -- Mechanical Soft -- Regular -- Multi-consistency -- Pill -- Cervical Esophageal Comment -- No flowsheet data found. Arvil Chaco MA, CCC-SLP Acute Care Speech Language Pathologist  Levi Aland 11/10/2016, 4:26 PM               Micro Results    Recent Results (from the past 240 hour(s))  MRSA PCR Screening     Status: None   Collection Time: 11/07/16  8:25 PM  Result Value Ref Range Status   MRSA by PCR NEGATIVE NEGATIVE Final    Comment:        The GeneXpert MRSA Assay (FDA approved for NASAL specimens only), is one component of a comprehensive MRSA colonization surveillance program. It is not intended to diagnose MRSA infection nor to guide or monitor treatment for MRSA infections.   C difficile quick scan w PCR reflex     Status: Abnormal   Collection Time: 11/14/16  5:17 AM  Result Value Ref Range Status   C Diff antigen POSITIVE (A) NEGATIVE Final   C Diff toxin POSITIVE (A) NEGATIVE Final   C Diff interpretation Toxin producing C. difficile detected.  Final    Comment: CRITICAL RESULT CALLED TO, READ BACK BY AND VERIFIED WITH: DILDY,V. AT [id number] ON 11/14/2016 BY BAUGHAM,M.     Today   Subjective    [name] today has no headache,no chest abdominal  pain,no new weakness tingling or numbness, feels much better .   Objective   Blood pressure (!) 116/58, pulse 83, temperature 97.9 F (36.6 C), temperature source Oral, resp. rate 20, height 5\' 8"  (1.727 m), weight 78.8 kg (173 lb 11.6 oz), SpO2 99 %.   Intake/Output Summary (Last 24 hours) at 11/15/16 0818 Last data filed at 11/14/16 2213  Gross per 24 hour  Intake              483 ml  Output              200 ml  Net              283 ml    Exam Awake Alert, Oriented x 3, No new F.N deficits, Normal  affect [place].AT,PERRAL Supple Neck,No JVD, No cervical lymphadenopathy appriciated.  Symmetrical Chest wall movement, Good air movement bilaterally, few rales RRR,No Gallops,Rubs or new Murmurs, No Parasternal Heave +ve B.Sounds, Abd Soft, Non tender, No organomegaly appriciated, No rebound -guarding or rigidity. No Cyanosis, Clubbing or edema, No new Rash or bruise   Data Review   CBC w Diff: Lab Results  Component Value Date   WBC 5.7 11/12/2016   HGB 9.8 (L) 11/12/2016   HCT 33.1 (L) 11/12/2016   HCT 34 01/11/2012   PLT 86 (L) 11/12/2016   LYMPHOPCT 10 11/11/2016   MONOPCT 11 11/11/2016   EOSPCT 3 11/11/2016   EOSPCT 12 01/11/2012   BASOPCT 0 11/11/2016    CMP: Lab Results  Component Value Date   NA 139 11/15/2016   NA 140 01/11/2012   K 3.7 11/15/2016   K 4.5 01/11/2012   CL 98 (L) 11/15/2016   CO2 33 (H) 11/15/2016   BUN 14 11/15/2016   BUN 12 01/11/2012   CREATININE 1.49 (H) 11/15/2016   CREATININE 1.17 01/11/2012   PROT 6.2 (L) 11/08/2016   ALBUMIN 3.0 (L) 11/08/2016   BILITOT 3.4 (H) 11/08/2016   ALKPHOS 70 11/08/2016   AST 26 11/08/2016   ALT 13 (L) 11/08/2016  .   Total Time in preparing paper work, data evaluation and todays exam - 35 minutes  Thurnell Lose M.D on 11/15/2016 at 8:18 AM  [facility]   Office  [phone]

## 2016-11-15 NOTE — Clinical Social Work Placement (Deleted)
   CLINICAL SOCIAL WORK PLACEMENT  NOTE  Date:  11/15/2016  Patient Details  Name: [name] MRN: [id number] Date of Birth: [birth year]  Clinical Social Work is seeking post-discharge placement for this patient at the [place] level of care (*CSW will initial, date and re-position this form in  chart as items are completed):  Yes   Patient/family provided with [place] Work Department's list of facilities offering this level of care within the geographic area requested by the patient (or if unable, by the patient's family).  Yes   Patient/family informed of their freedom to choose among providers that offer the needed level of care, that participate in Medicare, Medicaid or managed care program needed by the patient, have an available bed and are willing to accept the patient.  Yes   Patient/family informed of [place]'s ownership interest in [facility] and [facility], as well as of the fact that they are under no obligation to receive care at these facilities.  PASRR submitted to EDS on       PASRR number received on 11/14/16     Existing PASRR number confirmed on       FL2 transmitted to all facilities in geographic area requested by pt/family on 11/14/16     FL2 transmitted to all facilities within larger geographic area on       Patient informed that his/her managed care company has contracts with or will negotiate with certain facilities, including the following:        Yes   Patient/family informed of bed offers received.  Patient chooses bed at [facility]     Physician recommends and patient chooses bed at      Patient to be transferred to [facility] on 11/15/16.  Patient to be transferred to facility by [name], dtr     Patient family notified on 11/15/16 of transfer.  Name of family member notified:  [name], dtr     PHYSICIAN       Additional Comment: CSW notified Mardene Celeste  at [facility] that patient was discharging today. CSW provided Mardene Celeste with authorization details (Auth. [id number]; next review date 11/17/16; RUG level RVB).  CSW sent clinicals via [employer]. CSW signing off.    _______________________________________________ Ihor Gully, LCSW 11/15/2016, 9:47 AM

## 2016-11-15 NOTE — Telephone Encounter (Signed)
APPT MADE AND NOTIFIED NURSE ON 300

## 2016-11-15 NOTE — Care Management Note (Signed)
Case Management Note  Patient Details  Name: [name] MRN: [id number] Date of Birth: January 14, 1931   If discussed at Long Length of Stay Meetings, dates discussed:   11/15/2016 Additional Comments:  Abrey Bradway, Chauncey Reading, RN 11/15/2016, 2:52 PM

## 2016-11-15 NOTE — Progress Notes (Signed)
Discharge instructions given on medications and follow up visits patient and family verbalized understanding. Accompanied by staff to an awaiting vehicle.

## 2016-11-15 NOTE — Progress Notes (Signed)
Patient Name: [name] Date of Encounter: 11/15/2016  Primary Cardiologist: Dr. Aloha Gell [facility] Problem List     Principal Problem:   Atrial fibrillation with rapid ventricular response [facility]) Active Problems:   Essential hypertension, benign   COPD (chronic obstructive pulmonary disease) (HCC)   Stage III chronic kidney disease   Acute on chronic diastolic CHF (congestive heart failure) (HCC)   Aortic stenosis, moderate   Diffuse large B cell lymphoma (HCC)   Recurrent right pleural effusion   Chronic respiratory failure with hypoxia ([place])   DNR (do not resuscitate)   Telangiectasia   Symptomatic anemia   Melena    Subjective   No complaints of chest pain or dyspnea. Has not gotten out of bed much.   Inpatient Medications    Scheduled Meds: . digoxin  0.125 mg Oral Daily  . diltiazem  240 mg Oral Daily  . famotidine  20 mg Oral BID  . furosemide  40 mg Oral Daily  . levETIRAcetam  500 mg Oral BID  . midodrine  5 mg Oral TID WC  . QUEtiapine  25 mg Oral BID  . sodium chloride flush  3 mL Intravenous Q12H  . tamsulosin  0.4 mg Oral QHS  . vancomycin  250 mg Oral Q6H    PRN Meds: acetaminophen, haloperidol lactate, levalbuterol, methocarbamol   Vital Signs    Vitals:   11/14/16 1413 11/14/16 2255 11/15/16 0538 11/15/16 0539  BP: (!) 97/53 118/67 (!) 116/58   Pulse: 88 (!) 102 83   Resp: 20 20 20    Temp: 98.1 F (36.7 C) 97.6 F (36.4 C) 97.9 F (36.6 C)   TempSrc: Oral Oral Oral   SpO2: 95% 94% 99%   Weight:    173 lb 11.6 oz (78.8 kg)  Height:        Intake/Output Summary (Last 24 hours) at 11/15/16 0812 Last data filed at 11/14/16 2213  Gross per 24 hour  Intake              483 ml  Output              200 ml  Net              283 ml   Filed Weights   11/13/16 0500 11/14/16 0440 11/15/16 0539  Weight: 178 lb 5 oz (80.9 kg) 180 lb 9.6 oz (81.9 kg) 173 lb 11.6 oz (78.8 kg)    Physical Exam   Gen.: Chronically  ill-appearing bearded male. HEENT: Conjunctiva and lids normal, oropharynx clear. Neck: Supple, no elevated JVP or carotid bruits, no thyromegaly. Lungs: Clear, no wheezes, or rales. No coughing.  Cardiac: Irregularly irregular, no S3, 3/6 systolic murmur consistent with aortic stenosis, no pericardial rub. Abdomen: Soft, nontender, bowel sounds present. Extremities: No pitting edema, distal pulses 2+.  Labs    Basic Metabolic Panel  Recent Labs  11/14/16 0650 11/15/16 0512  NA 136 139  K 3.2* 3.7  CL 94* 98*  CO2 33* 33*  GLUCOSE 104* 97  BUN 14 14  CREATININE 1.47* 1.49*  CALCIUM 8.6* 8.7*  MG 2.0 2.0    Telemetry    Atrial fibrillation. Some episodes of RVR.   ECG    I personally reviewed the tracing from 11/07/2016 which showed atrial fibrillation with RVR, right bundle branch block, and left anterior fascicular block.  Cardiac Studies   Echocardiogram 05/20/2015: Study Conclusions  - Left ventricle: The cavity size was  normal. Wall thickness was   increased in a pattern of mild LVH. Systolic function was   vigorous. The estimated ejection fraction was in the range of 65%   to 70%. Wall motion was normal; there were no regional wall   motion abnormalities. The study is not technically sufficient to   allow evaluation of LV diastolic function. - Aortic valve: There was moderate to severe stenosis. Mean   gradient (S): 23 mm Hg. Peak gradient (S): 54 mm Hg. VTI ratio of   LVOT to aortic valve: 0.34. Valve area (VTI): 1.07 cm^2. Valve   area (Vmax): 1.08 cm^2. Valve area (Vmean): 1.21 cm^2. - Mitral valve: Calcified annulus. Mildly calcified leaflets . Mean   gradient (D): 4 mm Hg. Valve area by pressure half-time: 2.24   cm^2. Valve area by continuity equation (using LVOT flow): 1.67   cm^2. - Left atrium: The atrium was moderately dilated. - Right atrium: Central venous pressure (est): 3 mm Hg. - Atrial septum: There was increased thickness of the septum,    consistent with lipomatous hypertrophy. No defect or patent   foramen ovale was identified. - Tricuspid valve: There was trivial regurgitation. - Pulmonary arteries: Systolic pressure could not be accurately   estimated. - Pericardium, extracardiac: There was no pericardial effusion.  Impressions:  - Mild LVH with LVEF 65-70%, indeterminate diastolic function.   Moderate left atrial enlargement. Moderate to severe calcific   aortic stenosis as outlined above - there has been progression   compared to the prior study. Trivial tricuspid regurgitation,   unable to assess PASP.  Patient Profile     82 year old male with severe aortic stenosis, chronic atrial fibrillation, COPD, stage 3 kidney disease, and diffuse large cell lymphoma with recurrent right pleural effusion, and symptomatic anemia with GI bleed in the setting of GI telangiectasias. He has been taken off anticoagulation. Focusing on heart rate control of atrial fibrillation with medication adjustments. Do not plan to aggressively pursue invasive management of aortic stenosis. Patient currently DO NOT RESUSCITATE.  Assessment & Plan    1. Chronic atrial fibrillation with RVR in the setting of comorbid illnesses. Currently on Cardizem CD 240 mg daily and digoxin 0.125 mg daily. BP is stable and HR better controlled under the circumstances.  Xarelto has been stopped the setting of GI bleed. Do not plan to resume anticoagulation.  2. Moderate to severe calcific aortic stenosis based on echocardiogram done back in May of last year. Per discussion with patient in past he has preferred a conservative approach, and at this point in light of declining health with multiple comorbidities, he would not be a candidate for surgical repair or TAVR.  3. 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  4. Recurrent right pleural effusion, moderate in size with trace left pleural effusion  per CXR on 11/11/2016.  5. CKD, stage 3, stable creatinine 1.49 Potassium 3.7 improved from yesterday with replacement. t  6. Acute on chronic diastolic heart failure in the setting of severe anemia, rapid atrial fibrillation, and aortic stenosis. He has now been transitioned to po lasix 40 mg now daily with frequency change yesterday. Marland Kitchen He has diuresed 6.9 liters since admission and is comfortable without evidence of decompensation.   Jory Sims, NP 11/15/2016  Attending note:  Patient seen and examined. Modified above note by Ms. Lawrence NP. Patient pending discharge to SNF per primary team. Recent vitals show heart rate 80-100 in atrial fibrillation and SBP 115-120 range.  Output close to even (263 in greater than out) on oral Lasix. Creatinine stable at 1.5. No significant change on examination. Anticipate discharge on current doses of Lanoxin, Cardizem CD, and Lasix. Do not resume anticoagulation Complex patient with overall poor prognosis. Would continue DNR status.  Satira Sark, M.D., F.A.C.C.

## 2016-11-15 NOTE — Telephone Encounter (Signed)
Please schedule an appt with Randall Hiss or Magda Paganini (they saw him in hospital) in next 2-4 weeks.

## 2016-11-15 NOTE — Discharge Instructions (Signed)
Follow with Primary MD [name], MD in 3-4 days   Get CBC, CMP, 2 view Chest X ray checked  by SNF MD in 3-4 days ( we routinely change or add medications that can affect your baseline labs and fluid status, therefore we recommend that you get the mentioned basic workup next visit with your PCP, your PCP may decide not to get them or add new tests based on their clinical decision)   Activity: As tolerated with Full fall precautions use walker/cane & assistance as needed   Disposition SNF   Diet:   DIET DYS 3 with feeding assistance and aspiration precautions.  Check your Weight same time everyday, if you gain over 2 pounds, or you develop in leg swelling, experience more shortness of breath or chest pain, call your Primary MD immediately. Follow Cardiac Low Salt Diet and 1.5 lit/day fluid restriction.   On your next visit with your primary care physician please Get Medicines reviewed and adjusted.   Please request your Prim.MD to go over all Hospital Tests and Procedure/Radiological results at the follow up, please get all Hospital records sent to your Prim MD by signing hospital release before you go home.   If you experience worsening of your admission symptoms, develop shortness of breath, life threatening emergency, suicidal or homicidal thoughts you must seek medical attention immediately by calling 911 or calling your MD immediately  if symptoms less severe.  You Must read complete instructions/literature along with all the possible adverse reactions/side effects for all the Medicines you take and that have been prescribed to you. Take any new Medicines after you have completely understood and accpet all the possible adverse reactions/side effects.   Do not drive, operate heavy machinery, perform activities at heights, swimming or participation in water activities or provide baby sitting services if your were admitted for syncope or siezures until you have seen by Primary  MD or a Neurologist and advised to do so again.  Do not drive when taking Pain medications.    Do not take more than prescribed Pain, Sleep and Anxiety Medications  Special Instructions: If you have smoked or chewed Tobacco  in the last 2 yrs please stop smoking, stop any regular Alcohol  and or any Recreational drug use.  Wear Seat belts while driving.   Please note  You were cared for by a hospitalist during your hospital stay. If you have any questions about your discharge medications or the care you received while you were in the hospital after you are discharged, you can call the unit and asked to speak with the hospitalist on call if the hospitalist that took care of you is not available. Once you are discharged, your primary care physician will handle any further medical issues. Please note that NO REFILLS for any discharge medications will be authorized once you are discharged, as it is imperative that you return to your primary care physician (or establish a relationship with a primary care physician if you do not have one) for your aftercare needs so that they can reassess your need for medications and monitor your lab values.

## 2016-12-06 ENCOUNTER — Encounter: Payer: Self-pay | Admitting: Adult Health

## 2016-12-06 ENCOUNTER — Ambulatory Visit (INDEPENDENT_AMBULATORY_CARE_PROVIDER_SITE_OTHER): Payer: Medicare Other | Admitting: Adult Health

## 2016-12-06 VITALS — BP 100/58 | HR 76 | Ht 68.0 in | Wt 187.0 lb

## 2016-12-06 DIAGNOSIS — I481 Persistent atrial fibrillation: Secondary | ICD-10-CM

## 2016-12-06 DIAGNOSIS — I5032 Chronic diastolic (congestive) heart failure: Secondary | ICD-10-CM | POA: Diagnosis not present

## 2016-12-06 DIAGNOSIS — I10 Essential (primary) hypertension: Secondary | ICD-10-CM | POA: Diagnosis not present

## 2016-12-06 DIAGNOSIS — I4819 Other persistent atrial fibrillation: Secondary | ICD-10-CM

## 2016-12-06 NOTE — Progress Notes (Signed)
Cardiology Office Note   Date:  12/06/2016   ID:  [name], DOB April 23, 1931, MRN [id number]  PCP:  [name], MD  Cardiologist: Delman Cheadle, NP   No chief complaint on file.     History of Present Illness: [name] is a 82 y.o. male who presents for ongoing assessment and management of progressive aortic valve disease, chronic diastolic heart failure, atrial fibrillation, CHADS VASC Score of 4 on Xarelto, history of PE, and hypertension. The patient was seen on consultation during hospitalization on 11/07/2016 in the setting of atrial fibrillation with RVR. Patient was also found to have severe anemia with a hemoglobin of 5.1. His fecal occult blood was positive. He received 2 units of packed red blood cells. He also had periods of delirium. He is being followed by hematology. He is having iron replacement therapy.  Due to anemia and positive blood in stools anticoagulation was discontinued. The patient was diuresis 7 L and was sent home on Lasix 40 mg twice a day. Due to moderate severe aortic stenosis he will continue to be treated medically with multiple core morbidities.  The patient is now a resident of [facility] in [place]. Labs and medications are provided and monitored at that facility. He denies any fluid retention, worsening dyspnea, chest pain, or dizziness. He remains on oxygen.  Past Medical History:  Diagnosis Date  . Anemia   . Aortic stenosis, moderate 05/21/2015  . Asthma   . B12 deficiency 07/28/2016  . Cellulitis of leg, left 01/03/2015  . Cervical spondylosis without myelopathy   . Cholelithiasis   . Chronic atrial fibrillation ([place])   . Chronic diastolic heart failure ([place])   . Chronic lower back pain   . Cirrhosis ([place])    Questionable, AFP normal Feb 01/2012, hx of ETOH use   . COPD (chronic obstructive pulmonary disease) (H[facility])    Uses occasional nighttime O2  . Diffuse large B cell lymphoma ([place]) 08/22/2015   . Disseminated herpes zoster 2010  . DNR (do not resuscitate) 11/01/2015  . Dysrhythmia    chronic AFib  . Essential hypertension, benign   . GERD (gastroesophageal reflux disease)   . Headache(784.0)   . History of chicken pox 1941; 2011  . Iron deficiency anemia 01/18/2012  . Lupus anticoagulant positive   . Mixed hyperlipidemia   . Pulmonary embolus ([place])    2003 and 2011  . Pulmonary nodules    Chest CT 09/11  . Rectal ulcer 12/01/2015  . Rheumatoid arthritis(714.0)   . Seizure disorder ([place]) 09/2015  . Stage III chronic kidney disease   . Telangiectasia 11/29/2015   ANTRUM AND JEJUNUM    Past Surgical History:  Procedure Laterality Date  . BACK SURGERY    . CATARACT EXTRACTION W/ INTRAOCULAR LENS  IMPLANT, BILATERAL    . CHOLECYSTECTOMY  05/23/2012   Procedure: LAPAROSCOPIC CHOLECYSTECTOMY WITH INTRAOPERATIVE CHOLANGIOGRAM;  Surgeon: Stark Klein, MD;  Location: [place] [facility];  Service: General;  Laterality: N/A;  . COLONOSCOPY  01/24/2013   [id number] polyps/colonic diverticulosis  . COLONOSCOPY N/A 12/01/2015   Procedure: COLONOSCOPY;  Surgeon: Danie Binder, MD;  Location: [facility];  Service: Endoscopy;  Laterality: N/A;  . ESOPHAGOGASTRODUODENOSCOPY  02/02/12   [id number] size hiatal hernia; otherwise normal exam  . ESOPHAGOGASTRODUODENOSCOPY N/A 11/29/2015   [id number] diverticulosis/small internal hemorrhoids/left colon is redundant  . LYMPH NODE BIOPSY    . LYMPH NODE BIOPSY Right 09/11/2015   Procedure: RIGHT INGUINAL LYMPH NODE BIOPSY;  Surgeon: Aviva Signs, MD;  Location: [facility];  Service: General;  Laterality: Right;  . MYRINGOTOMY     "3 times; both ears"  . POLYPECTOMY  02/02/2012   RMR: Multiple colonic polyps removed, flat, tubular adenomas/ Left-sided diverticulosis  . PORTACATH PLACEMENT    . PORTACATH PLACEMENT Left 09/11/2015   Procedure: INSERTION PORT-A-CATH;  Surgeon: Aviva Signs, MD;  Location: [facility];  Service: General;  Laterality: Left;  .  POSTERIOR LUMBAR VERTEBRAE EXCISION  2003; 2009; 2011     Current Outpatient Prescriptions  Medication Sig Dispense Refill  . albuterol (PROAIR HFA) 108 (90 BASE) MCG/ACT inhaler Inhale 2 puffs into the lungs every 6 (six) hours as needed. Shortness of Breath 1 Inhaler 3  . allopurinol (ZYLOPRIM) 300 MG tablet Take 300 mg by mouth daily.  3  . DIGOX 125 MCG tablet TAKE ONE TABLET BY MOUTH DAILY 15 tablet 0  . diltiazem (CARDIZEM CD) 240 MG 24 hr capsule Take 1 capsule (240 mg total) by mouth daily.    . ferrous sulfate 325 (65 FE) MG tablet Take 325 mg by mouth daily.    . fluticasone (FLONASE) 50 MCG/ACT nasal spray Place 2 sprays into both nostrils daily.   0  . folic acid (FOLVITE) 1 MG tablet Take 1 tablet (1 mg total) by mouth daily. 30 tablet 11  . furosemide (LASIX) 40 MG tablet Take 1 tablet (40 mg total) by mouth 2 (two) times daily. Take daily for prevention of fluid buildup. 30 tablet   . gabapentin (NEURONTIN) 300 MG capsule Take 1 capsule (300 mg total) by mouth 2 (two) times daily.    Marland Kitchen ipratropium-albuterol (DUONEB) 0.5-2.5 (3) MG/3ML SOLN Take 3 mLs by nebulization every 6 (six) hours as needed. (Patient taking differently: Take 3 mLs by nebulization every 6 (six) hours as needed (shortness of breath). ) 360 mL 1  . levalbuterol (XOPENEX) 0.63 MG/3ML nebulizer solution Take 3 mLs (0.63 mg total) by nebulization every 6 (six) hours as needed for wheezing or shortness of breath. 3 mL 0  . levETIRAcetam (KEPPRA) 500 MG tablet Take 1 tablet (500 mg total) by mouth 2 (two) times daily. 60 tablet 0  . lidocaine-prilocaine (EMLA) cream Apply a quarter size amount to port site 1 hour prior to chemo. Do not rub in. Cover with plastic wrap. 30 g 3  . methocarbamol (ROBAXIN) 500 MG tablet Take 1 tablet (500 mg total) by mouth every 8 (eight) hours as needed for muscle spasms. 30 tablet 0  . Omega-3 Fatty Acids (FISH OIL) 1000 MG CAPS Take 2 capsules by mouth 2 (two) times daily.     Marland Kitchen  omeprazole (PRILOSEC) 40 MG capsule Take 1 capsule (40 mg total) by mouth 2 (two) times daily. 60 capsule 3  . Oxycodone HCl 10 MG TABS Take 1 tablet (10 mg total) by mouth every 6 (six) hours as needed. 10 tablet 0  . potassium chloride SA (K-DUR,KLOR-CON) 20 MEQ tablet Take 1 tablet (20 mEq total) by mouth daily. Starting 08/27/15.    Marland Kitchen predniSONE (DELTASONE) 10 MG tablet Take 10 mg by mouth daily with breakfast.    . QUEtiapine (SEROQUEL) 25 MG tablet Take 1 tablet (25 mg total) by mouth 2 (two) times daily.    . tadalafil (CIALIS) 5 MG tablet Take 5 mg by mouth daily as needed for erectile dysfunction.     . tamsulosin (FLOMAX) 0.4 MG CAPS capsule Take 1 capsule (0.4 mg total) by mouth at bedtime. For prostate  treatment. 30 capsule 3  . Vitamin D, Ergocalciferol, (DRISDOL) 50000 UNITS CAPS capsule Take 1 capsule by mouth once a week. Reported on 07/05/2016  4   No current facility-administered medications for this visit.    Facility-Administered Medications Ordered in Other Visits  Medication Dose Route Frequency Provider Last Rate Last Dose  . heparin lock flush 100 unit/mL  500 Units Intravenous Once Baird Cancer, PA-C      . sodium chloride flush (NS) 0.9 % injection 10 mL  10 mL Intravenous PRN Baird Cancer, PA-C        Allergies:   Cymbalta [duloxetine hcl]; Procaine hcl; and Bystolic [nebivolol hcl]    Social History:  The patient  reports that he quit smoking about 18 years ago. His smoking use included Cigarettes. He has a 57.00 pack-year smoking history. He has never used smokeless tobacco. He reports that he does not drink alcohol or use drugs.   Family History:  The patient's family history includes Diabetes in his mother.    ROS: All other systems are reviewed and negative. Unless otherwise mentioned in H&P    PHYSICAL EXAM: VS:  BP (!) 100/58   Pulse 76   Ht 5\' 8"  (1.727 m)   Wt 187 lb (84.8 kg)   SpO2 99%   BMI 28.43 kg/m  , BMI Body mass index is 28.43  kg/m. GEN: Well nourished, well developed, in no acute distress  HEENT: normal  Neck: no JVD, carotid bruits, or masses Cardiac: RRR; occasional irregular beat, 1/6 systolic murmurs, rubs, or gallops,no edema  Respiratory:  Diminished in the bases, wearing O2 at 2 liters.  GI: soft, nontender, nondistended, + BS MS: no deformity or atrophy Generalized deconditioning.  Skin: warm and dry, no rash Neuro:  Strength and sensation are intact Psych: euthymic mood, full affect   Recent Labs: 11/08/2016: ALT 13 11/10/2016: B Natriuretic Peptide 464.0 11/12/2016: Hemoglobin 9.8; Platelets 86 11/15/2016: BUN 14; Creatinine, Ser 1.49; Magnesium 2.0; Potassium 3.7; Sodium 139    Lipid Panel    Component Value Date/Time   CHOL 160 05/22/2012 0505   TRIG 98 05/22/2012 0505   HDL 49 05/22/2012 0505   CHOLHDL 3.3 05/22/2012 0505   VLDL 20 05/22/2012 0505   LDLCALC 91 05/22/2012 0505      Wt Readings from Last 3 Encounters:  12/06/16 187 lb (84.8 kg)  11/15/16 173 lb 11.6 oz (78.8 kg)  09/15/16 194 lb 1.6 oz (88 kg)      Other studies Reviewed: 11/11/2016 Left ventricle: The cavity size was normal. Wall thickness was   increased in a pattern of moderate LVH. Systolic function was   normal. The estimated ejection fraction was in the range of 60%   to 65%. Wall motion was normal; there were no regional wall   motion abnormalities. The study is not technically sufficient to   allow evaluation of LV diastolic function. - Aortic valve: Moderately calcified annulus. Probably trileaflet;   moderately calcified leaflets. There was severe stenosis. Mean   gradient (S): 28 mm Hg. Peak gradient (S): 55 mm Hg. VTI ratio of   LVOT to aortic valve: 0.3. Valve area (VTI): 0.86 cm^2. Valve   area (Vmax): 0.86 cm^2. - Mitral valve: Moderately calcified annulus. Mildly calcified   leaflets . There was trivial regurgitation. - Left atrium: The atrium was moderately dilated. - Right atrium: The  atrium was mildly dilated. Central venous   pressure (est): 3 mm Hg. - Tricuspid valve: There was  trivial regurgitation. - Pulmonary arteries: Systolic pressure could not be accurately   estimated. - Pericardium, extracardiac: There was no pericardial effusion.  ASSESSMENT AND PLAN:  1.  Severe aortic valve stenosis: Medical management only. Keeping heart rate and blood pressure well controlled.  2. Chronic anemia: Followed by oncology. Remains on iron replacement.  3. Oxygen dependent COPD: Stable.  4. Chronic diastolic heart failure: Appears euvolemic today. No changes in medication regimen or diuretics. Labs are being followed by [facility] physician.  5. Atrial fibrillation: Heart rate is currently well controlled on diltiazem 240 mg and digoxin 125 g daily. He is not on anticoagulation due to blood loss anemia.   Current medicines are reviewed at length with the patient today.    Labs/ tests ordered today include:  No orders of the defined types were placed in this encounter.    Disposition:   FU with 3 months in [place] at his request  Signed, Jory Sims, NP  12/06/2016 1:51 PM    [place] [facility]. [address] [place], [place] [address] Phone: [phone]; Fax: [phone]

## 2016-12-06 NOTE — Progress Notes (Signed)
Name: [name]    DOB: 11-17-1931  Age: 82 y.o.  MR#: [id number]       PCP:  [name], MD      Insurance: Payor: BLUE CROSS BLUE [employer]IELD MEDICARE / Plan: BCBS MEDICARE / Product Type: *No Product type* /   CC:   No chief complaint on file.   VS Vitals:   12/06/16 1312  BP: (!) 100/58  Pulse: 76  SpO2: 99%  Weight: 187 lb (84.8 kg)  Height: _0  (1.727 m)    Weights Current Weight  12/06/16 187 lb (84.8 kg)  11/15/16 173 lb 11.6 oz (78.8 kg)  09/15/16 194 lb 1.6 oz (88 kg)    Blood Pressure  BP Readings from Last 3 Encounters:  12/06/16 (!) 100/58  11/15/16 (!) 116/58  10/14/16 (!) 96/50     Admit date:  (Not on file) Last encounter with RMR:  Visit date not found   Allergy Cymbalta [duloxetine hcl]; Procaine hcl; and Bystolic [nebivolol hcl]  Current Outpatient Prescriptions  Medication Sig Dispense Refill  . albuterol (PROAIR HFA) 108 (90 BASE) MCG/ACT inhaler Inhale 2 puffs into the lungs every 6 (six) hours as needed. Shortness of Breath 1 Inhaler 3  . allopurinol (ZYLOPRIM) 300 MG tablet Take 300 mg by mouth daily.  3  . DIGOX 125 MCG tablet TAKE ONE TABLET BY MOUTH DAILY 15 tablet 0  . diltiazem (CARDIZEM CD) 240 MG 24 hr capsule Take 1 capsule (240 mg total) by mouth daily.    . ferrous sulfate 325 (65 FE) MG tablet Take 325 mg by mouth daily.    . fluticasone (FLONASE) 50 MCG/ACT nasal spray Place 2 sprays into both nostrils daily.   0  . folic acid (FOLVITE) 1 MG tablet Take 1 tablet (1 mg total) by mouth daily. 30 tablet 11  . furosemide (LASIX) 40 MG tablet Take 1 tablet (40 mg total) by mouth 2 (two) times daily. Take daily for prevention of fluid buildup. 30 tablet   . gabapentin (NEURONTIN) 300 MG capsule Take 1 capsule (300 mg total) by mouth 2 (two) times daily.    Marland Kitchen ipratropium-albuterol (DUONEB) 0.5-2.5 (3) MG/3ML SOLN Take 3 mLs by nebulization every 6 (six) hours as needed. (Patient taking differently: Take 3 mLs by nebulization every  6 (six) hours as needed (shortness of breath). ) 360 mL 1  . levalbuterol (XOPENEX) 0.63 MG/3ML nebulizer solution Take 3 mLs (0.63 mg total) by nebulization every 6 (six) hours as needed for wheezing or shortness of breath. 3 mL 0  . levETIRAcetam (KEPPRA) 500 MG tablet Take 1 tablet (500 mg total) by mouth 2 (two) times daily. 60 tablet 0  . lidocaine-prilocaine (EMLA) cream Apply a quarter size amount to port site 1 hour prior to chemo. Do not rub in. Cover with plastic wrap. 30 g 3  . methocarbamol (ROBAXIN) 500 MG tablet Take 1 tablet (500 mg total) by mouth every 8 (eight) hours as needed for muscle spasms. 30 tablet 0  . Omega-3 Fatty Acids (FISH OIL) 1000 MG CAPS Take 2 capsules by mouth 2 (two) times daily.     Marland Kitchen omeprazole (PRILOSEC) 40 MG capsule Take 1 capsule (40 mg total) by mouth 2 (two) times daily. 60 capsule 3  . Oxycodone HCl 10 MG TABS Take 1 tablet (10 mg total) by mouth every 6 (six) hours as needed. 10 tablet 0  . potassium chloride SA (K-DUR,KLOR-CON) 20 MEQ tablet Take 1 tablet (20 mEq total) by  mouth daily. Starting 08/27/15.    Marland Kitchen predniSONE (DELTASONE) 10 MG tablet Take 10 mg by mouth daily with breakfast.    . QUEtiapine (SEROQUEL) 25 MG tablet Take 1 tablet (25 mg total) by mouth 2 (two) times daily.    . tadalafil (CIALIS) 5 MG tablet Take 5 mg by mouth daily as needed for erectile dysfunction.     . tamsulosin (FLOMAX) 0.4 MG CAPS capsule Take 1 capsule (0.4 mg total) by mouth at bedtime. For prostate treatment. 30 capsule 3  . Vitamin D, Ergocalciferol, (DRISDOL) 50000 UNITS CAPS capsule Take 1 capsule by mouth once a week. Reported on 07/05/2016  4   No current facility-administered medications for this visit.    Facility-Administered Medications Ordered in Other Visits  Medication Dose Route Frequency Provider Last Rate Last Dose  . heparin lock flush 100 unit/mL  500 Units Intravenous Once Baird Cancer, PA-C      . sodium chloride flush (NS) 0.9 % injection 10  mL  10 mL Intravenous PRN Baird Cancer, PA-C        Discontinued Meds:    Medications Discontinued During This Encounter  Medication Reason  . vancomycin (VANCOCIN) 50 mg/mL oral solution Error    Patient Active Problem List   Diagnosis Date Noted  . Melena   . Symptomatic anemia 11/07/2016  . Atrial fibrillation with rapid ventricular response ([place]) 11/07/2016  . Folate deficiency 09/01/2016  . B12 deficiency 07/28/2016  . Rectal ulcer 12/02/2015  . Telangiectasia 12/02/2015  . Syncope and collapse 11/28/2015  . AKI (acute kidney injury) ([place]) 11/28/2015  . Dehydration 11/28/2015  . History of pulmonary embolism 11/28/2015  . Elevated troponin I level 11/28/2015  . Normocytic anemia 11/28/2015  . GI bleed 11/28/2015  . Epigastric abdominal pain 11/28/2015  . DNR (do not resuscitate) 11/01/2015  . Back pain   . Status post thoracentesis   . Altered mental status   . Altered mental state 10/18/2015  . Acute encephalopathy 10/18/2015  . Chronic respiratory failure with hypoxia ([place]) 10/18/2015  . Leukocytosis 10/18/2015  . Seroma 10/18/2015  . Cellulitis of groin 10/18/2015  . Malignant pleural effusion   . Chronic respiratory failure ([place]) 09/17/2015  . Recurrent right pleural effusion   . Chronic diastolic CHF (congestive heart failure) ([place])   . HCAP (healthcare-associated pneumonia) 09/13/2015  . COPD with acute exacerbation ([place]) 09/13/2015  . Atrial fibrillation with RVR ([place])   . Acute respiratory distress 08/22/2015  . Diffuse large B-cell lymphoma of lymph nodes of multiple regions ([place]) 08/22/2015  . Chronic a-fib ([place]) 08/22/2015  . BPH (benign prostatic hyperplasia) 08/22/2015  . Acute diastolic CHF (congestive heart failure) ([place])   . Nonrheumatic aortic valve stenosis 05/21/2015  . Acute on chronic diastolic CHF (congestive heart failure) ([place]) 05/19/2015  . COPD exacerbation ([place]) 05/19/2015  . Acute kidney injury ([place]) 01/05/2015  .  Thrombocytopenia ([place]) 01/05/2015  . Stage III chronic kidney disease 01/05/2015  . Cellulitis of leg, left 01/03/2015  . Stiffness of joints, not elsewhere classified, multiple sites 01/28/2014  . Posture imbalance 01/28/2014  . Headache(784.0) 10/21/2013  . Cervical spondylosis without myelopathy 10/21/2013  . Chronic diastolic heart failure ([place]) 07/16/2013  . Adenomatous polyps 01/11/2013  . Anemia 10/13/2012  . COPD (chronic obstructive pulmonary disease) ([place]) 05/21/2012  . Long term current use of anticoagulant 05/21/2012  . Arthritis 05/21/2012  . Iron deficiency anemia 01/18/2012  . Lupus anticoagulant positive 01/18/2012  . GERD (gastroesophageal reflux disease) 01/18/2012  .  Cirrhosis ([place]) 01/18/2012    Class: Question of  . Essential hypertension, benign 11/02/2010  . PULMONARY EMBOLISM 11/02/2010  . Chronic atrial fibrillation ([place]) 11/02/2010  . Rheumatoid arthritis ([place]) 11/02/2010    LABS    Component Value Date/Time   NA 139 11/15/2016 0512   NA 136 11/14/2016 0650   NA 135 11/13/2016 0738   NA 140 01/11/2012 1005   K 3.7 11/15/2016 0512   K 3.2 (L) 11/14/2016 0650   K 3.5 11/13/2016 0738   K 4.5 01/11/2012 1005   CL 98 (L) 11/15/2016 0512   CL 94 (L) 11/14/2016 0650   CL 93 (L) 11/13/2016 0738   CO2 33 (H) 11/15/2016 0512   CO2 33 (H) 11/14/2016 0650   CO2 35 (H) 11/13/2016 0738   GLUCOSE 97 11/15/2016 0512   GLUCOSE 104 (H) 11/14/2016 0650   GLUCOSE 103 (H) 11/13/2016 0738   BUN 14 11/15/2016 0512   BUN 14 11/14/2016 0650   BUN 15 11/13/2016 0738   BUN 12 01/11/2012 1005   CREATININE 1.49 (H) 11/15/2016 0512   CREATININE 1.47 (H) 11/14/2016 0650   CREATININE 1.29 (H) 11/13/2016 0738   CREATININE 1.17 01/11/2012 1005   CALCIUM 8.7 (L) 11/15/2016 0512   CALCIUM 8.6 (L) 11/14/2016 0650   CALCIUM 8.3 (L) 11/13/2016 0738   CALCIUM 9.2 01/11/2012 1005   GFRNONAA 42 (L) 11/15/2016 0512   GFRNONAA 42 (L) 11/14/2016 0650   GFRNONAA 50 (L)  11/13/2016 0738   GFRAA 48 (L) 11/15/2016 0512   GFRAA 49 (L) 11/14/2016 0650   GFRAA 57 (L) 11/13/2016 0738   CMP     Component Value Date/Time   NA 139 11/15/2016 0512   NA 140 01/11/2012 1005   K 3.7 11/15/2016 0512   K 4.5 01/11/2012 1005   CL 98 (L) 11/15/2016 0512   CO2 33 (H) 11/15/2016 0512   GLUCOSE 97 11/15/2016 0512   BUN 14 11/15/2016 0512   BUN 12 01/11/2012 1005   CREATININE 1.49 (H) 11/15/2016 0512   CREATININE 1.17 01/11/2012 1005   CALCIUM 8.7 (L) 11/15/2016 0512   CALCIUM 9.2 01/11/2012 1005   PROT 6.2 (L) 11/08/2016 0417   ALBUMIN 3.0 (L) 11/08/2016 0417   AST 26 11/08/2016 0417   ALT 13 (L) 11/08/2016 0417   ALKPHOS 70 11/08/2016 0417   BILITOT 3.4 (H) 11/08/2016 0417   GFRNONAA 42 (L) 11/15/2016 0512   GFRAA 48 (L) 11/15/2016 0512       Component Value Date/Time   WBC 5.7 11/12/2016 0537   WBC 6.0 11/11/2016 0954   WBC 7.7 11/10/2016 0512   HGB 9.8 (L) 11/12/2016 0537   HGB 9.4 (L) 11/11/2016 0954   HGB 9.8 (L) 11/10/2016 0512   HCT 33.1 (L) 11/12/2016 0537   HCT 32.6 (L) 11/11/2016 0954   HCT 33.2 (L) 11/10/2016 0512   HCT 34 01/11/2012 1007   HCT 34 01/05/2012 1049   MCV 90.7 11/12/2016 0537   MCV 91.6 11/11/2016 0954   MCV 90.7 11/10/2016 0512   MCV 92.0 01/11/2012 1007    Lipid Panel     Component Value Date/Time   CHOL 160 05/22/2012 0505   TRIG 98 05/22/2012 0505   HDL 49 05/22/2012 0505   CHOLHDL 3.3 05/22/2012 0505   VLDL 20 05/22/2012 0505   LDLCALC 91 05/22/2012 0505    ABG    Component Value Date/Time   PHART 7.455 (H) 10/18/2015 1250   PCO2ART 48.0 (H) 10/18/2015 1250  PO2ART 54.7 (L) 10/18/2015 1250   HCO3 32.1 (H) 10/18/2015 1250   O2SAT 87.8 10/18/2015 1250     Lab Results  Component Value Date   TSH 1.001 05/19/2015   BNP (last 3 results)  Recent Labs  11/07/16 1440 11/10/16 0512  BNP 619.0* 464.0*    ProBNP (last 3 results) No results for input(s): PROBNP in the last 8760 hours.  Cardiac Panel  (last 3 results) No results for input(s): CKTOTAL, CKMB, TROPONINI, RELINDX in the last 72 hours.  Iron/TIBC/Ferritin/ %Sat    Component Value Date/Time   IRON 10 (L) 11/09/2016 0933   TIBC 290 11/09/2016 0933   FERRITIN 236 11/09/2016 0933   IRONPCTSAT 3 (L) 11/09/2016 0933   IRONPCTSAT 6 01/11/2012 1005     EKG Orders placed or performed during the hospital encounter of 11/07/16  . EKG     Prior Assessment and Plan Problem List as of 12/06/2016 Reviewed: 11/07/2016  4:44 PM by Reubin Milan, MD     Cardiovascular and Mediastinum   Essential hypertension, benign   Last Assessment & Plan 12/31/2014 Office Visit Written 12/31/2014 11:25 AM by Satira Sark, MD    Blood pressure running somewhat low. We will try to wean him off clonidine, otherwise continue Cardizem CD as this is also assisting with atrial fibrillation rate control. Keep follow-up with Dr. Edrick Oh.      PULMONARY EMBOLISM   Last Assessment & Plan 01/27/2012 Office Visit Written 01/27/2012  5:27 PM by Satira Sark, MD    Continues on chronic Coumadin with history of positive lupus anticoagulant. He states that he is going to undergo an elective colonoscopy, and is to have bridging with Lovenox while off Coumadin.      Chronic atrial fibrillation [facility])   Last Assessment & Plan 12/31/2014 Office Visit Written 12/31/2014 11:25 AM by Satira Sark, MD    Chronic, continue strategy of heart rate control and anticoagulation. Coumadin is followed by Dr. Edrick Oh.      Chronic diastolic heart failure [facility])   Last Assessment & Plan 07/16/2013 Office Visit Written 07/16/2013 10:31 AM by Satira Sark, MD    Weight is stable. Continue current diuretic regimen. Blood pressure control is good today.      Acute on chronic diastolic CHF (congestive heart failure) (HCC)   Nonrheumatic aortic valve stenosis   Acute diastolic CHF (congestive heart failure) (HCC)   Chronic a-fib (HCC)   Atrial fibrillation with RVR (HCC)    Chronic diastolic CHF (congestive heart failure) (HCC)   Syncope and collapse   Telangiectasia   Atrial fibrillation with rapid ventricular response (HCC)     Respiratory   COPD (chronic obstructive pulmonary disease) (HCC)   COPD exacerbation ([place])   HCAP (healthcare-associated pneumonia)   COPD with acute exacerbation (HCC)   Recurrent right pleural effusion   Chronic respiratory failure (HCC)   Malignant pleural effusion   Chronic respiratory failure with hypoxia (HCC)     Digestive   GERD (gastroesophageal reflux disease)   Last Assessment & Plan 01/18/2012 Office Visit Written 01/18/2012  3:42 PM by Mahala Menghini, PA    Chronic gerd. No prior egd. Need egd to r/o complicated GERD ie Barrett's as well as potential source for IDA. EGD with deep sedation.  I have discussed the risks, alternatives, benefits with regards to but not limited to the risk of reaction to medication, bleeding, infection, perforation and the patient is agreeable to proceed. Written consent to be obtained.  Cirrhosis [facility])   Last Assessment & Plan 09/07/2015 Office Visit Written 09/10/2015  4:12 PM by Carlis Stable, NP    Patient with a history of cirrhosis with last endoscopy completed on 2013 which found moderate size hiatal hernia otherwise normal exam. We will complete an endoscopy per oncology request prior to starting treatment for lung mass.      GI bleed   Rectal ulcer   Melena     Nervous and Auditory   Acute encephalopathy     Musculoskeletal and Integument   Cervical spondylosis without myelopathy   Rheumatoid arthritis (HCC)   Arthritis     Immune and Lymphatic   Diffuse large B-cell lymphoma of lymph nodes of multiple regions [facility])   Last Assessment & Plan 09/15/2016 Office Visit Edited 09/15/2016  5:17 PM by Baird Cancer, PA-C    Stage IV DLBCL with positive involvement of bone marrow.  He is S/P 1 cycle of R-mini-CHOP but issues with tolerance was appreciated.  His treatment was  transitioned to palliative management with single-agent Rituxan.  Despite bone marrow involvement, he is not a candidate for IT therapy given high likelihood of intolerance.  Oncology history is updated.  Labs today: CBC diff, CMET, LDH, ESR, CRP, anemia panel.  I personally reviewed and went over laboratory results with the patient.  The results are noted within this dictation.  HGB is 8.4 g/dL.  No indication for hemolysis.  I will repeat CBC and order stool cards x 3.   His RA, symptomatically, is the patient's biggest complaint.  I will refer him back to his rheumatologist, Dr. Charlestine Night, to discuss treatment options.  Return in 2 months for follow-up.        Genitourinary   Acute kidney injury ([place])   Stage III chronic kidney disease   BPH (benign prostatic hyperplasia)   AKI (acute kidney injury) ([place])     Hematopoietic and Hemostatic   Lupus anticoagulant positive   Last Assessment & Plan 06/26/2013 Office Visit Written 06/26/2013 11:29 AM by Josue Hector, MD    With history of PE on coumadin for afib as well.  Suspect he should be bridged with lovenox if coumadin ever stopped        Other   Headache(784.0)   Iron deficiency anemia   Last Assessment & Plan 01/03/2013 Office Visit Written 01/11/2013  9:44 AM by Orvil Feil, NP    82 year old male with hx of IDA, recurrent PE, afib, lupus anticoagulant positive on chronic Coumadin. Recently found to be heme positive at his PCP. TCS and EGD on file from 01/2012. EGD essentially normal, and TCS with multiple flat adenomatous polyps. He was slated for a 6 month surveillance, but this is now somewhat overdue. He denies any signs of melena or hematochezia. Only complaint is fatigue.   Obtain most recent CBC, iron, ferritin Proceed with surveillance TCS due to multiple adenomatous polyps in Feb 2013 (somewhat overdue) Consider capsule study if labs note decreasing ferritin or Hgb       Long term current use of anticoagulant   Anemia    Last Assessment & Plan 11/02/2012 Office Visit Written 11/02/2012  3:06 PM by Lendon Colonel, NP    Hgb down from 10.7 to 9.2 on Oct 13, 2012. He is to see his PCP with lab work already planned.  Hemoccult stools.      Adenomatous polyps   Last Assessment & Plan 09/07/2015 Office Visit Edited 09/10/2015  4:11 PM by  Carlis Stable, NP    Agent with a history of adenomatous polyps on last colonoscopy with noted poor prep. Recommended 6 month follow-up which was never completed by the patient. At this point we'll plan for overdue surveillance colonoscopy with extended prep to promote better outcome.  Proceed with TCS with Dr. Gala Romney in near future: the risks, benefits, and alternatives have been discussed with the patient in detail. The patient states understanding and desires to proceed.  The patient is not on any anticoagulants. He is on Percocet as needed as well as Neurontin. Last colonoscopy was completed under conscious sedation with no complications, however his previous endoscopy was completed under deep sedation. We'll order his procedure under propofol/MAC to promote adequate sedation.      Stiffness of joints, not elsewhere classified, multiple sites   Posture imbalance   Cellulitis of leg, left   Thrombocytopenia (HCC)   Acute respiratory distress   Altered mental state   Leukocytosis   Seroma   Cellulitis of groin   Altered mental status   Back pain   Status post thoracentesis   DNR (do not resuscitate)   Dehydration   History of pulmonary embolism   Elevated troponin I level   Normocytic anemia   Epigastric abdominal pain   B12 deficiency   Last Assessment & Plan 09/15/2016 Office Visit Edited 09/15/2016  5:17 PM by Baird Cancer, PA-C    B12 deficiency, on B12 replacement IM.  Completed weekly injections x 4.  Now starting monthly injections.  Supportive therapy plan is reviewed.  B12 injection today.      Folate deficiency   Last Assessment & Plan 09/15/2016 Office  Visit Written 09/15/2016  5:17 PM by Baird Cancer, PA-C    Folate deficiency.  On PO replacement.      Symptomatic anemia       Imaging: Dg Chest Port 1 View  Result Date: 11/11/2016 CLINICAL DATA:  Shortness of breath today.  Ex-smoker. EXAM: PORTABLE CHEST 1 VIEW COMPARISON:  11/10/2016. FINDINGS: The cardiac silhouette remains borderline enlarged. Stable prominence of the pulmonary vasculature and interstitial markings. Stable right pleural effusion and right basilar airspace opacity. A small left pleural effusion is also stable. The left subclavian porta catheter is unchanged. Unremarkable bones. IMPRESSION: Stable changes of congestive heart failure with right basilar atelectasis or pneumonia. Electronically Signed   By: Claudie Revering M.D.   On: 11/11/2016 08:24   Dg Chest Port 1 View  Result Date: 11/10/2016 CLINICAL DATA:  Shortness of breath, COPD-asthma, acute on chronic CHF, chronic renal insufficiency stage III. EXAM: PORTABLE CHEST 1 VIEW COMPARISON:  Portable chest x-ray of November 09, 2016. FINDINGS: The left lung is reasonably well inflated. There is persistent volume loss on the right. There is a moderate size right pleural effusion and small left pleural effusion. The pulmonary interstitial markings are diffusely increased. The cardiac silhouette is enlarged. There is mild pulmonary vascular congestion but this has improved since yesterday's study. There is calcification in the wall of the thoracic aorta. The left subclavian venous catheter tip projects over the proximal SVC. IMPRESSION: COPD. CHF with pulmonary edema, slightly improved. Right lower lobe atelectasis or pneumonia with moderate-sized right pleural effusion slightly improved. Trace left pleural effusion. Electronically Signed   By: David  Martinique M.D.   On: 11/10/2016 08:53   Dg Chest Port 1 View  Result Date: 11/09/2016 CLINICAL DATA:  Shortness of breath history of COPD, CHF, aortic stenosis. EXAM: PORTABLE  CHEST 1 VIEW  COMPARISON:  Portable chest x-ray of November 07, 2016 FINDINGS: The patient is positioned in a lordotic matter similar to yesterday's study. The left lung is well-expanded. The interstitial markings remain increased. On the right there is further volume loss consistent with increased pleural effusion. Confluent density in the inferior aspect of the aerated right lung persists. The cardiac silhouette is enlarged. The pulmonary vascularity is mildly engorged. There is calcification in the wall of the aortic arch. The mediastinum is normal in width. IMPRESSION: Increased volume of pleural fluid on the right since a study of 2 days ago. CHF with pulmonary interstitial edema. COPD with persistent right lower lobe atelectasis or pneumonia. Electronically Signed   By: David  Martinique M.D.   On: 11/09/2016 08:28   Dg Chest Port 1 View  Result Date: 11/07/2016 CLINICAL DATA:  Worsening cough and shortness of breath. EXAM: PORTABLE CHEST 1 VIEW COMPARISON:  Single-view of the chest 05/28/2016 and 11/28/2015. FINDINGS: There is interstitial pulmonary edema. Moderate right pleural effusion with associated basilar airspace disease is noted. Port-A-Cath is in place. Heart size is upper normal. No pneumothorax. IMPRESSION: Interstitial pulmonary edema with a moderate right pleural effusion and basilar airspace disease which could be atelectasis or pneumonia. Electronically Signed   By: Inge Rise M.D.   On: 11/07/2016 15:08   Dg Swallowing Func-speech Pathology  Result Date: 11/10/2016 Objective Swallowing Evaluation: Type of Study: MBS-Modified Barium Swallow Study Patient Details Name: JAMES SENN MRN: [id number] Date of Birth: 11-07-1931 Today's Date: 11/10/2016 Time: SLP Start Time (ACUTE ONLY): 1544-SLP Stop Time (ACUTE ONLY): 1556 SLP Time Calculation (min) (ACUTE ONLY): 12 min Past Medical History: Past Medical History: Diagnosis Date . Anemia  . Aortic stenosis, moderate 05/21/2015 . Asthma  .  B12 deficiency 07/28/2016 . Cellulitis of leg, left 01/03/2015 . Cervical spondylosis without myelopathy  . Cholelithiasis  . Chronic atrial fibrillation ([place])  . Chronic diastolic heart failure ([place])  . Chronic lower back pain  . Cirrhosis ([place])   Questionable, AFP normal Feb 01/2012, hx of ETOH use  . COPD (chronic obstructive pulmonary disease) (HCC)   Uses occasional nighttime O2 . Diffuse large B cell lymphoma ([place]) 08/22/2015 . Disseminated herpes zoster 2010 . DNR (do not resuscitate) 11/01/2015 . Dysrhythmia   chronic AFib . Essential hypertension, benign  . GERD (gastroesophageal reflux disease)  . Headache(784.0)  . History of chicken pox 1941; 2011 . Iron deficiency anemia 01/18/2012 . Lupus anticoagulant positive  . Mixed hyperlipidemia  . Pulmonary embolus ([place])   2003 and 2011 . Pulmonary nodules   Chest CT 09/11 . Rectal ulcer 12/01/2015 . Rheumatoid arthritis(714.0)  . Seizure disorder ([place]) 09/2015 . Stage III chronic kidney disease  . 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 BACK SURGERY   . CATARACT EXTRACTION W/ INTRAOCULAR LENS  IMPLANT, BILATERAL   . CHOLECYSTECTOMY  05/23/2012  Procedure: LAPAROSCOPIC CHOLECYSTECTOMY WITH INTRAOPERATIVE CHOLANGIOGRAM;  Surgeon: Stark Klein, MD;  Location: [facility];  Service: General;  Laterality: N/A; . COLONOSCOPY  01/24/2013  JME:QASTMHD polyps/colonic diverticulosis . COLONOSCOPY N/A 12/01/2015  Procedure: COLONOSCOPY;  Surgeon: Danie Binder, MD;  [facility];  Service: Endoscopy;  Laterality: N/A; . ESOPHAGOGASTRODUODENOSCOPY  02/02/12  [id number] size hiatal hernia; otherwise normal exam . ESOPHAGOGASTRODUODENOSCOPY N/A 11/29/2015  JHE:[id number] diverticulosis/small internal hemorrhoids/left colon is redundant . LYMPH NODE BIOPSY   . LYMPH NODE BIOPSY Right 09/11/2015  Procedure: RIGHT INGUINAL LYMPH NODE BIOPSY;  Surgeon: Aviva Signs, MD;  Location: [facility];  Service: General;   Laterality: Right; . MYRINGOTOMY    "3 times; both ears" . POLYPECTOMY  02/02/2012  RMR: Multiple colonic polyps removed, flat, tubular adenomas/ Left-sided diverticulosis . PORTACATH PLACEMENT   . PORTACATH PLACEMENT Left 09/11/2015  Procedure: INSERTION PORT-A-CATH;  Surgeon: Aviva Signs, MD;  Location: [facility];  Service: General;  Laterality: Left; . POSTERIOR LUMBAR VERTEBRAE EXCISION  2003; 2009; 2011 HPI: 82 y.o.malewith medical history significant ofchronic iron deficiency anemia, telangiectasia of antrum and jejunum,paroxysmal atrial fibrillation on Xarelto, chronic diastolic heart failure, O97 deficiency, COPD, diffuse large B cell lymphoma, HX of disseminated herpes zoster, essential hypertension, hyperlipidemia, GERD, lupus anticoagulant, rheumatoid arthritis, pulmonary embolism in 2000 03/04/2010, seizure disorder, stage III chronic kidney disease who comes to the emergency department with complaints of progressively worse fatigue, dyspnea and back pain. He has had several episodes of melena recently. Chest x ray significnat for Increased volume of pleural fluid on the right since a study of 2 days ago. CHF with pulmonary interstitial edema. COPD with persistent right lower lobe atelectasis or pneumonia No Data Recorded Assessment / Plan / Recommendation CHL IP CLINICAL IMPRESSIONS 11/10/2016 Therapy Diagnosis Mild oral phase dysphagia;Mild pharyngeal phase dysphagia Clinical Impression Pt presents with mild oropharyngeal dysphagia. Pt exhibited adequate airway protection throughout the swallow study without aspiration. Trace penetration of thin liquids occured with consecutive large straw sips that was expelled after the swallow. Smaller cup sips of thin liquids prevented penetration. Pt with  prolonged mastication of solid PO secondary to edentulous oral cavity. Recommend dysphagia 3 (mechanical soft) and thin liquids with medicines whole. No further ST needs identified.  Impact on safety and  function Mild aspiration risk   CHL IP TREATMENT RECOMMENDATION 11/10/2016 Treatment Recommendations No treatment recommended at this time   Prognosis 11/10/2016 Prognosis for Safe Diet Advancement Good Barriers to Reach Goals -- Barriers/Prognosis Comment -- CHL IP DIET RECOMMENDATION 11/10/2016 SLP Diet Recommendations Thin liquid;Regular solids Liquid Administration via Cup Medication Administration Whole meds with liquid Compensations Slow rate;Small sips/bites Postural Changes Remain semi-upright after after feeds/meals (Comment);Seated upright at 90 degrees   CHL IP OTHER RECOMMENDATIONS 11/10/2016 Recommended Consults -- Oral Care Recommendations Oral care BID Other Recommendations --   CHL IP FOLLOW UP RECOMMENDATIONS 11/10/2016 Follow up Recommendations 24 hour supervision/assistance   CHL IP FREQUENCY AND DURATION 11/09/2016 Speech Therapy Frequency (ACUTE ONLY) min 2x/week Treatment Duration 1 week      CHL IP ORAL PHASE 11/10/2016 Oral Phase Impaired Oral - Pudding Teaspoon -- Oral - Pudding Cup -- Oral - Honey Teaspoon -- Oral - Honey Cup -- Oral - Nectar Teaspoon -- Oral - Nectar Cup -- Oral - Nectar Straw -- Oral - Thin Teaspoon -- Oral - Thin Cup -- Oral - Thin Straw -- Oral - Puree -- Oral - Mech Soft -- Oral - Regular Impaired mastication;Delayed oral transit;Decreased bolus cohesion Oral - Multi-Consistency -- Oral - Pill Delayed oral transit Oral Phase - Comment --  CHL IP PHARYNGEAL PHASE 11/10/2016 Pharyngeal Phase Impaired Pharyngeal- Pudding Teaspoon -- Pharyngeal -- Pharyngeal- Pudding Cup -- Pharyngeal -- Pharyngeal- Honey Teaspoon -- Pharyngeal -- Pharyngeal- Honey Cup -- Pharyngeal -- Pharyngeal- Nectar Teaspoon -- Pharyngeal -- Pharyngeal- Nectar Cup Delayed swallow initiation-vallecula Pharyngeal -- Pharyngeal- Nectar Straw -- Pharyngeal -- Pharyngeal- Thin Teaspoon -- Pharyngeal -- Pharyngeal- Thin Cup Delayed swallow initiation-vallecula Pharyngeal -- Pharyngeal- Thin Straw  Penetration/Aspiration during swallow Pharyngeal Material enters airway, remains ABOVE vocal cords then ejected out Pharyngeal- Puree Delayed swallow initiation-vallecula Pharyngeal --  Pharyngeal- Mechanical Soft -- Pharyngeal -- Pharyngeal- Regular Delayed swallow initiation-vallecula Pharyngeal -- Pharyngeal- Multi-consistency -- Pharyngeal -- Pharyngeal- Pill Delayed swallow initiation-vallecula Pharyngeal -- Pharyngeal Comment --  CHL IP CERVICAL ESOPHAGEAL PHASE 11/10/2016 Cervical Esophageal Phase WFL Pudding Teaspoon -- Pudding Cup -- Honey Teaspoon -- Honey Cup -- Nectar Teaspoon -- Nectar Cup -- Nectar Straw -- Thin Teaspoon -- Thin Cup -- Thin Straw -- Puree -- Mechanical Soft -- Regular -- Multi-consistency -- Pill -- Cervical Esophageal Comment -- No flowsheet data found. Arvil Chaco MA, [facility] Speech Language Pathologist  Levi Aland 11/10/2016, 4:26 PM

## 2016-12-06 NOTE — Patient Instructions (Signed)
Your physician recommends that you schedule a follow-up appointment in: 2 months in [place] office with Dr Domenic Polite   Your physician recommends that you continue on your current medications as directed. Please refer to the Current Medication list given to you today.    If you need a refill on your cardiac medications before your next appointment, please call your pharmacy.    Thank you for choosing [facility] !

## 2016-12-15 ENCOUNTER — Encounter: Payer: Self-pay | Admitting: Nurse Practitioner

## 2016-12-15 ENCOUNTER — Telehealth: Payer: Self-pay | Admitting: Nurse Practitioner

## 2016-12-15 ENCOUNTER — Ambulatory Visit: Payer: Medicare Other | Admitting: Nurse Practitioner

## 2016-12-15 NOTE — Telephone Encounter (Signed)
Noted  

## 2016-12-15 NOTE — Telephone Encounter (Signed)
PATIENT WAS A NO SHOW AND LETTER SENT  °

## 2017-01-31 ENCOUNTER — Telehealth (HOSPITAL_COMMUNITY): Payer: Self-pay | Admitting: *Deleted

## 2017-01-31 NOTE — Telephone Encounter (Signed)
Patients daughter called and stated patient was in hospital in November and then went to [facility] for rehab for 21 days. This is why he missed his appointments. Daughter wants to get his appointment set back up and also a refill on pain medication.

## 2017-02-01 ENCOUNTER — Other Ambulatory Visit (HOSPITAL_COMMUNITY): Payer: Self-pay | Admitting: Oncology

## 2017-02-01 DIAGNOSIS — C8338 Diffuse large B-cell lymphoma, lymph nodes of multiple sites: Secondary | ICD-10-CM

## 2017-02-01 DIAGNOSIS — J90 Pleural effusion, not elsewhere classified: Secondary | ICD-10-CM

## 2017-02-01 DIAGNOSIS — M059 Rheumatoid arthritis with rheumatoid factor, unspecified: Secondary | ICD-10-CM

## 2017-02-01 DIAGNOSIS — Z95828 Presence of other vascular implants and grafts: Secondary | ICD-10-CM

## 2017-02-01 DIAGNOSIS — M47812 Spondylosis without myelopathy or radiculopathy, cervical region: Secondary | ICD-10-CM

## 2017-02-01 DIAGNOSIS — E538 Deficiency of other specified B group vitamins: Secondary | ICD-10-CM

## 2017-02-01 MED ORDER — OXYCODONE HCL 10 MG PO TABS: 10.0000 mg | ORAL_TABLET | Freq: Four times a day (QID) | ORAL | 0 refills | Status: DC | PRN

## 2017-02-01 NOTE — Telephone Encounter (Signed)
Pain medication is printed.  Set-up for follow-up appointment

## 2017-02-06 NOTE — Progress Notes (Deleted)
Cardiology Office Note  Date: 02/06/2017   ID: [name], DOB January 03, 1931, MRN [id number]  PCP: [name], MD  Primary Cardiologist: Rozann Lesches, MD   No chief complaint on file.   History of Present Illness: [name] is a medically complex 83 y.o. male last seen by Ms. Lawrence NP in December 2017.  He is no longer anticoagulated with history of severe anemia and GI bleeding.  Past Medical History:  Diagnosis Date  . Anemia   . Aortic stenosis, moderate 05/21/2015  . Asthma   . B12 deficiency 07/28/2016  . Cellulitis of leg, left 01/03/2015  . Cervical spondylosis without myelopathy   . Cholelithiasis   . Chronic atrial fibrillation ([place])   . Chronic diastolic heart failure ([place])   . Chronic lower back pain   . Cirrhosis ([place])    Questionable, AFP normal Feb 01/2012, hx of ETOH use   . COPD (chronic obstructive pulmonary disease) (HCC)    Uses occasional nighttime O2  . Diffuse large B cell lymphoma ([place]) 08/22/2015  . Disseminated herpes zoster 2010  . DNR (do not resuscitate) 11/01/2015  . Dysrhythmia    chronic AFib  . Essential hypertension, benign   . GERD (gastroesophageal reflux disease)   . Headache(784.0)   . History of chicken pox 1941; 2011  . Iron deficiency anemia 01/18/2012  . Lupus anticoagulant positive   . Mixed hyperlipidemia   . Pulmonary embolus ([place])    2003 and 2011  . Pulmonary nodules    Chest CT 09/11  . Rectal ulcer 12/01/2015  . Rheumatoid arthritis(714.0)   . Seizure disorder ([place]) 09/2015  . Stage III chronic kidney disease   . Telangiectasia 11/29/2015   ANTRUM AND JEJUNUM    Past Surgical History:  Procedure Laterality Date  . BACK SURGERY    . CATARACT EXTRACTION W/ INTRAOCULAR LENS  IMPLANT, BILATERAL    . CHOLECYSTECTOMY  05/23/2012   Procedure: LAPAROSCOPIC CHOLECYSTECTOMY WITH INTRAOPERATIVE CHOLANGIOGRAM;  Surgeon: Stark Klein, MD;  Location: [facility];  Service: General;  Laterality: N/A;  .  COLONOSCOPY  01/24/2013   [id number] polyps/colonic diverticulosis  . COLONOSCOPY N/A 12/01/2015   Procedure: COLONOSCOPY;  Surgeon: Danie Binder, MD;  Location: [facility];  Service: Endoscopy;  Laterality: N/A;  . ESOPHAGOGASTRODUODENOSCOPY  02/02/12   [id number] size hiatal hernia; otherwise normal exam  . ESOPHAGOGASTRODUODENOSCOPY N/A 11/29/2015   [id number] diverticulosis/small internal hemorrhoids/left colon is redundant  . LYMPH NODE BIOPSY    . LYMPH NODE BIOPSY Right 09/11/2015   Procedure: RIGHT INGUINAL LYMPH NODE BIOPSY;  Surgeon: Aviva Signs, MD;  Location: [facility];  Service: General;  Laterality: Right;  . MYRINGOTOMY     "3 times; both ears"  . POLYPECTOMY  02/02/2012   RMR: Multiple colonic polyps removed, flat, tubular adenomas/ Left-sided diverticulosis  . PORTACATH PLACEMENT    . PORTACATH PLACEMENT Left 09/11/2015   Procedure: INSERTION PORT-A-CATH;  Surgeon: Aviva Signs, MD;  Location: [facility];  Service: General;  Laterality: Left;  . POSTERIOR LUMBAR VERTEBRAE EXCISION  2003; 2009; 2011    Current Outpatient Prescriptions  Medication Sig Dispense Refill  . albuterol (PROAIR HFA) 108 (90 BASE) MCG/ACT inhaler Inhale 2 puffs into the lungs every 6 (six) hours as needed. Shortness of Breath 1 Inhaler 3  . allopurinol (ZYLOPRIM) 300 MG tablet Take 300 mg by mouth daily.  3  . DIGOX 125 MCG tablet TAKE ONE TABLET BY MOUTH DAILY 15 tablet 0  . diltiazem (CARDIZEM  CD) 240 MG 24 hr capsule Take 1 capsule (240 mg total) by mouth daily.    . ferrous sulfate 325 (65 FE) MG tablet Take 325 mg by mouth daily.    . fluticasone (FLONASE) 50 MCG/ACT nasal spray Place 2 sprays into both nostrils daily.   0  . folic acid (FOLVITE) 1 MG tablet Take 1 tablet (1 mg total) by mouth daily. 30 tablet 11  . furosemide (LASIX) 40 MG tablet Take 1 tablet (40 mg total) by mouth 2 (two) times daily. Take daily for prevention of fluid buildup. 30 tablet   . gabapentin (NEURONTIN) 300 MG  capsule Take 1 capsule (300 mg total) by mouth 2 (two) times daily.    Marland Kitchen ipratropium-albuterol (DUONEB) 0.5-2.5 (3) MG/3ML SOLN Take 3 mLs by nebulization every 6 (six) hours as needed. (Patient taking differently: Take 3 mLs by nebulization every 6 (six) hours as needed (shortness of breath). ) 360 mL 1  . levalbuterol (XOPENEX) 0.63 MG/3ML nebulizer solution Take 3 mLs (0.63 mg total) by nebulization every 6 (six) hours as needed for wheezing or shortness of breath. 3 mL 0  . levETIRAcetam (KEPPRA) 500 MG tablet Take 1 tablet (500 mg total) by mouth 2 (two) times daily. 60 tablet 0  . lidocaine-prilocaine (EMLA) cream Apply a quarter size amount to port site 1 hour prior to chemo. Do not rub in. Cover with plastic wrap. 30 g 3  . methocarbamol (ROBAXIN) 500 MG tablet Take 1 tablet (500 mg total) by mouth every 8 (eight) hours as needed for muscle spasms. 30 tablet 0  . Omega-3 Fatty Acids (FISH OIL) 1000 MG CAPS Take 2 capsules by mouth 2 (two) times daily.     Marland Kitchen omeprazole (PRILOSEC) 40 MG capsule Take 1 capsule (40 mg total) by mouth 2 (two) times daily. 60 capsule 3  . Oxycodone HCl 10 MG TABS Take 1 tablet (10 mg total) by mouth every 6 (six) hours as needed. 120 tablet 0  . potassium chloride SA (K-DUR,KLOR-CON) 20 MEQ tablet Take 1 tablet (20 mEq total) by mouth daily. Starting 08/27/15.    Marland Kitchen predniSONE (DELTASONE) 10 MG tablet Take 10 mg by mouth daily with breakfast.    . QUEtiapine (SEROQUEL) 25 MG tablet Take 1 tablet (25 mg total) by mouth 2 (two) times daily.    . tadalafil (CIALIS) 5 MG tablet Take 5 mg by mouth daily as needed for erectile dysfunction.     . tamsulosin (FLOMAX) 0.4 MG CAPS capsule Take 1 capsule (0.4 mg total) by mouth at bedtime. For prostate treatment. 30 capsule 3  . Vitamin D, Ergocalciferol, (DRISDOL) 50000 UNITS CAPS capsule Take 1 capsule by mouth once a week. Reported on 07/05/2016  4   No current facility-administered medications for this visit.     Facility-Administered Medications Ordered in Other Visits  Medication Dose Route Frequency Provider Last Rate Last Dose  . heparin lock flush 100 unit/mL  500 Units Intravenous Once Baird Cancer, PA-C      . sodium chloride flush (NS) 0.9 % injection 10 mL  10 mL Intravenous PRN Baird Cancer, PA-C       Allergies:  Cymbalta [duloxetine hcl]; Procaine hcl; and Bystolic [nebivolol hcl]   Social History: The patient  reports that he quit smoking about 19 years ago. His smoking use included Cigarettes. He has a 57.00 pack-year smoking history. He has never used smokeless tobacco. He reports that he does not drink alcohol or use drugs.  Family History: The patient's family history includes Diabetes in his mother.   ROS:  Please see the history of present illness. Otherwise, complete review of systems is positive for {NONE DEFAULTED:18576::"none"}.  All other systems are reviewed and negative.   Physical Exam: VS:  There were no vitals taken for this visit., BMI There is no height or weight on file to calculate BMI.  Wt Readings from Last 3 Encounters:  12/06/16 187 lb (84.8 kg)  11/15/16 173 lb 11.6 oz (78.8 kg)  09/15/16 194 lb 1.6 oz (88 kg)    General: Patient appears comfortable at rest. HEENT: Conjunctiva and lids normal, oropharynx clear with moist mucosa. Neck: Supple, no elevated JVP or carotid bruits, no thyromegaly. Lungs: Clear to auscultation, nonlabored breathing at rest. Cardiac: Regular rate and rhythm, no S3 or significant systolic murmur, no pericardial rub. Abdomen: Soft, nontender, no hepatomegaly, bowel sounds present, no guarding or rebound. Extremities: No pitting edema, distal pulses 2+. Skin: Warm and dry. Musculoskeletal: No kyphosis. Neuropsychiatric: Alert and oriented x3, affect grossly appropriate.  ECG: I personally reviewed the tracing from11/13/2017 which showed atrial fibrillation with RVR, right bundle branch block, and left anterior  fascicular block.  Recent Labwork: 11/08/2016: ALT 13; AST 26 11/10/2016: B Natriuretic Peptide 464.0 11/12/2016: Hemoglobin 9.8; Platelets 86 11/15/2016: BUN 14; Creatinine, Ser 1.49; Magnesium 2.0; Potassium 3.7; Sodium 139     Component Value Date/Time   CHOL 160 05/22/2012 0505   TRIG 98 05/22/2012 0505   HDL 49 05/22/2012 0505   CHOLHDL 3.3 05/22/2012 0505   VLDL 20 05/22/2012 0505   LDLCALC 91 05/22/2012 0505    Other Studies Reviewed Today:  Echocardiogram 11/11/2016: Study Conclusions  - Left ventricle: The cavity size was normal. Wall thickness was   increased in a pattern of moderate LVH. Systolic function was   normal. The estimated ejection fraction was in the range of 60%   to 65%. Wall motion was normal; there were no regional wall   motion abnormalities. The study is not technically sufficient to   allow evaluation of LV diastolic function. - Aortic valve: Moderately calcified annulus. Probably trileaflet;   moderately calcified leaflets. There was severe stenosis. Mean   gradient (S): 28 mm Hg. Peak gradient (S): 55 mm Hg. VTI ratio of   LVOT to aortic valve: 0.3. Valve area (VTI): 0.86 cm^2. Valve   area (Vmax): 0.86 cm^2. - Mitral valve: Moderately calcified annulus. Mildly calcified   leaflets . There was trivial regurgitation. - Left atrium: The atrium was moderately dilated. - Right atrium: The atrium was mildly dilated. Central venous   pressure (est): 3 mm Hg. - Tricuspid valve: There was trivial regurgitation. - Pulmonary arteries: Systolic pressure could not be accurately   estimated. - Pericardium, extracardiac: There was no pericardial effusion.  Impressions:  - Moderate LVH with LVEF 60-65%. Indeterminate diastolic function   in the setting of atrial fibrillation. Moderate left atrial   enlargement. Moderately calcified mitral annulus with calcified   mitral leaflets and trivial mitral regurgitation. Severe calcific   aortic stenosis as  outlined above. Trivial tricuspid   regurgitation.  Assessment and Plan:    Current medicines were reviewed with the patient today.  No orders of the defined types were placed in this encounter.   Disposition:  Signed, Satira Sark, MD, [facility] 02/06/2017 12:55 PM    [facility], [place], [place] [address] Phone: [phone]; Fax: [phone]

## 2017-02-07 ENCOUNTER — Encounter: Payer: Self-pay | Admitting: Cardiology

## 2017-02-07 ENCOUNTER — Ambulatory Visit: Payer: Medicare Other | Admitting: Cardiology

## 2017-02-22 ENCOUNTER — Encounter (HOSPITAL_COMMUNITY): Payer: Medicare Other | Attending: Oncology | Admitting: Oncology

## 2017-02-22 ENCOUNTER — Encounter (HOSPITAL_COMMUNITY): Payer: Self-pay | Admitting: Oncology

## 2017-02-22 VITALS — BP 95/65 | HR 67 | Temp 97.7°F | Resp 18 | Ht 68.0 in | Wt 186.0 lb

## 2017-02-22 DIAGNOSIS — D508 Other iron deficiency anemias: Secondary | ICD-10-CM | POA: Diagnosis present

## 2017-02-22 DIAGNOSIS — M069 Rheumatoid arthritis, unspecified: Secondary | ICD-10-CM | POA: Diagnosis not present

## 2017-02-22 DIAGNOSIS — R51 Headache: Secondary | ICD-10-CM | POA: Diagnosis not present

## 2017-02-22 DIAGNOSIS — C8338 Diffuse large B-cell lymphoma, lymph nodes of multiple sites: Secondary | ICD-10-CM

## 2017-02-22 DIAGNOSIS — D649 Anemia, unspecified: Secondary | ICD-10-CM | POA: Diagnosis present

## 2017-02-22 DIAGNOSIS — R519 Headache, unspecified: Secondary | ICD-10-CM

## 2017-02-22 DIAGNOSIS — E538 Deficiency of other specified B group vitamins: Secondary | ICD-10-CM | POA: Diagnosis not present

## 2017-02-22 DIAGNOSIS — Z95828 Presence of other vascular implants and grafts: Secondary | ICD-10-CM

## 2017-02-22 LAB — CBC WITH DIFFERENTIAL/PLATELET
BASOS ABS: 0 10*3/uL (ref 0.0–0.1)
Basophils Relative: 0 %
Eosinophils Absolute: 0.1 10*3/uL (ref 0.0–0.7)
Eosinophils Relative: 1 %
HEMATOCRIT: 42.3 % (ref 39.0–52.0)
Hemoglobin: 13.7 g/dL (ref 13.0–17.0)
LYMPHS PCT: 8 %
Lymphs Abs: 0.6 10*3/uL — ABNORMAL LOW (ref 0.7–4.0)
MCH: 30.2 pg (ref 26.0–34.0)
MCHC: 32.4 g/dL (ref 30.0–36.0)
MCV: 93.2 fL (ref 78.0–100.0)
Monocytes Absolute: 0.6 10*3/uL (ref 0.1–1.0)
Monocytes Relative: 8 %
NEUTROS ABS: 6.3 10*3/uL (ref 1.7–7.7)
NEUTROS PCT: 82 %
PLATELETS: 103 10*3/uL — AB (ref 150–400)
RBC: 4.54 MIL/uL (ref 4.22–5.81)
RDW: 17 % — ABNORMAL HIGH (ref 11.5–15.5)
WBC: 7.7 10*3/uL (ref 4.0–10.5)

## 2017-02-22 LAB — COMPREHENSIVE METABOLIC PANEL
ALT: 16 U/L — AB (ref 17–63)
AST: 28 U/L (ref 15–41)
Albumin: 3.1 g/dL — ABNORMAL LOW (ref 3.5–5.0)
Alkaline Phosphatase: 60 U/L (ref 38–126)
Anion gap: 7 (ref 5–15)
BILIRUBIN TOTAL: 0.9 mg/dL (ref 0.3–1.2)
BUN: 14 mg/dL (ref 6–20)
CHLORIDE: 102 mmol/L (ref 101–111)
CO2: 29 mmol/L (ref 22–32)
CREATININE: 0.98 mg/dL (ref 0.61–1.24)
Calcium: 8.9 mg/dL (ref 8.9–10.3)
GFR calc Af Amer: >60 mL/min (ref >60)
Glucose, Bld: 161 mg/dL — ABNORMAL HIGH (ref 65–99)
Potassium: 4.3 mmol/L (ref 3.5–5.1)
Sodium: 138 mmol/L (ref 135–145)
Total Protein: 6.6 g/dL (ref 6.5–8.1)

## 2017-02-22 LAB — LACTATE DEHYDROGENASE: LDH: 210 U/L — AB (ref 98–192)

## 2017-02-22 MED ORDER — CYANOCOBALAMIN 1000 MCG/ML IJ SOLN
1000.0000 ug | Freq: Once | INTRAMUSCULAR | Status: AC
  Administered 2017-02-22: 1000 ug via INTRAMUSCULAR

## 2017-02-22 MED ORDER — SODIUM CHLORIDE 0.9% FLUSH
10.0000 mL | INTRAVENOUS | Status: DC | PRN
  Administered 2017-02-22: 10 mL via INTRAVENOUS
  Filled 2017-02-22: qty 10

## 2017-02-22 MED ORDER — HEPARIN SOD (PORK) LOCK FLUSH 100 UNIT/ML IV SOLN
500.0000 [IU] | Freq: Once | INTRAVENOUS | Status: AC
  Administered 2017-02-22: 500 [IU] via INTRAVENOUS
  Filled 2017-02-22: qty 5

## 2017-02-22 MED ORDER — CYANOCOBALAMIN 1000 MCG/ML IJ SOLN
INTRAMUSCULAR | Status: AC
  Filled 2017-02-22: qty 1

## 2017-02-22 MED ORDER — BUTALBITAL-APAP-CAFFEINE 50-325-40 MG PO TABS
1.0000 | ORAL_TABLET | Freq: Once | ORAL | Status: AC
  Administered 2017-02-22: 1 via ORAL
  Filled 2017-02-22: qty 1

## 2017-02-22 NOTE — Progress Notes (Signed)
[name], MD [place] [id number]  Diffuse large B-cell lymphoma of lymph nodes of multiple regions [facility]) - Plan: CBC with Differential, Comprehensive metabolic panel, Lactate dehydrogenase, Beta 2 microglobuline, serum, CBC with Differential, Comprehensive metabolic panel, Lactate dehydrogenase, CBC with Differential, Comprehensive metabolic panel, Lactate dehydrogenase, Beta 2 microglobuline, serum  Other iron deficiency anemia - Plan: CBC with Differential, Iron and TIBC, Ferritin, CBC with Differential, CBC with Differential, Iron and TIBC, Ferritin, Iron and TIBC, Ferritin, CANCELED: Iron and TIBC, CANCELED: Ferritin  Anemia, unspecified type - Plan: CBC with Differential, Vitamin B12, Folate, Iron and TIBC, Ferritin, CBC with Differential, Iron and TIBC, Ferritin, CANCELED: Vitamin B12, CANCELED: Folate  B12 deficiency - Plan: CBC with Differential, Vitamin B12, SCHEDULING COMMUNICATION INJECTION, cyanocobalamin ((VITAMIN B-12)) injection 1,000 mcg, CBC with Differential, Vitamin B12, CANCELED: Vitamin B12  Folate deficiency - Plan: CBC with Differential, Folate, CBC with Differential, Folate, CANCELED: Folate  Acute intractable headache, unspecified headache type - Plan: butalbital-acetaminophen-caffeine (FIORICET, ESGIC) 50-325-40 MG per tablet 1 tablet  Port catheter in place - Plan: Schedule Portacath Flush Appointment, heparin lock flush 100 unit/mL, sodium chloride flush (NS) 0.9 % injection 10 mL  CURRENT THERAPY: Surveillance  INTERVAL HISTORY: [name] 83 y.o. 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  He is S/P 1 cycle of R-mini-CHOP but issues with tolerance was appreciated.  His treatment was transitioned to palliative management with single-agent Rituxan.  This too was discontinued due to intolerance.  Despite bone marrow involvement, he is not a candidate for IT therapy given high  likelihood of intolerance.  Complete metabolic response to therapy identified on PET imaging on 07/15/2016. AND Significant anemia, in the setting of RA, with a history of iron deficiency anemia, having required PRBCs in the past.    Diffuse large B-cell lymphoma of lymph nodes of multiple regions [facility])   08/22/2015 - 08/25/2015 Hospital Admission    Acute respiratory distress      08/22/2015 Imaging    CTA chest- Right infrahilar lymph node versus central lung mass measuring 2.8 x 4.1 cm. Worsening mediastinal adenopathy and bilateral hilar adenopathy as described with the largest node over the subcarinal region measuring 2.7 cm by short axis...      08/24/2015 Pathology Results    Diagnosis PLEURAL FLUID, RIGHT(SPECIMEN 1 OF 1 COLLECTED 08/24/15): ATYPICAL LYMPHOCYTES SUSPICIOUS FOR A LYMPHOPROLIFERATIVE PROCESS, SEE COMMENT. Tony Males MD      08/25/2015 Imaging    CT abd/pelvis- Mild upper retroperitoneal (gastrohepatic ligament and right retrocrural) lymphadenopathy. Cirrhosis.      09/03/2015 PET scan    Widespread metastatic adenopathy including the RIGHT cervical lymph nodes, LEFT axillary lymph nodes, mediastinal lymph, hilar lymph node, retroperitoneal lymph nodes and iliac lymph nodes and inguinal lymph nodes. Inguinal lymph nodes may be most....      09/11/2015 Pathology Results    Diagnosis Lymph node, biopsy, right inguinal - DIFFUSE LARGE B CELL LYMPHOMA.      09/11/2015 Pathology Results    Interpretation Tissue-Flow Cytometry - MONOCLONAL B-CELL POPULATION IDENTIFIED. Diagnosis Comment: The findings are consistent with non-Hodgkin B-cell lymphoma. (BNS:ecj 09/16/2015)      09/13/2015 - 09/18/2015 Hospital Admission    COPD exacerbation      09/18/2015 Bone Marrow Biopsy    Bone Marrow, Aspirate,Biopsy, and Clot, left iliac crest: Despite limited findings, the features are worrisome for minimal involvement by B cell lymphoproliferative process, particularly given  the previous  history of large B cell lymphoma.       09/21/2015 - 10/11/2015 Chemotherapy    Mini-R-CHOP      09/29/2015 - 10/02/2015 Hospital Admission    SOB, anemia, dehydration, recurrent pleural effusion (right) Direct admit from [facility].  S/p 5 units of PRBCs, therapeutic thoracentesis on right relieveing 1.5 L of fluid, adjustment of pain medication.      10/12/2015 Treatment Plan Change    Patient intolerant to R-mini-CHOP.  Trantition to palliative treatment with single-agent Rituxan.      10/12/2015 - 11/02/2015 Chemotherapy    Rituxan single-agent      10/18/2015 - 10/21/2015 Hospital Admission    1.   Acute encephalopathy 2.   New onset seizure disorder      10/19/2015 Imaging    EEG- This recording is abnormal for the following reasons: Single epileptiform discharge involving the right anterior temporal area. This can correlates clinically with focal seizures.      11/28/2015 - 12/03/2015 Hospital Admission    Hospital Admission for BRBPR      07/15/2016 PET scan    Complete interval metabolic response to therapy. No metabolically active lymphoma identified. Persistent moderate right pleural effusion, right lung atelectasis, and emphysema.      07/28/2016 Procedure    A total of approximately 1.3 L of RIGHT pleural fluid was removed.      08/02/2016 Pathology Results    PLEURAL FLUID, RIGHT (SPECIMEN 1 OF 1 COLLECTED 07/28/16): - REACTIVE MESOTHELIAL CELLS PRESENT ADMIXED WITH LYMPHOCYTES. Tissue-Flow Cytometry - NO MONOCLONAL B CELL POPULATION IDENTIFIED. - T CELLS WITH NONSPECIFIC CHANGES.       Hospitalization from 11/07/2016 to 11/15/2016 is noted.  He was admitted with symptomatic anemia in the setting of chronic anticoagulation with Xarelto for paroxysmal atrial fibrillation.  He was Hemoccult positive.  He did receive 2 units of packed red blood cells.  It appears as though Xarelto was discontinued by cardiology.  He was followed by GI during his hospitalization and  considered for outpatient small bowel endoscopy.  Additionally, acute on chronic diastolic congestive heart failure was noted and he was diuresed with Lasix and 7 L were diuresed.  He was discharged to SNF.  He reports that his joint pain continues to be an issue.  He does follow with Dr. Charlestine Night, rheumatology, for his rheumatoid arthritis.  Since his hospitalization, he has not received B12 injections.  His last one was in October 2017.  He reports a headache that has been ongoing x 3 days.  He has been using Tylenol with minimal improvement.  He has also tried Excedrin headaches.  He continues to have issues with insomnia.  He notes that he naps periodically throughout the day.  He notes that he sleeps for a hour or so and then wakes.  Sometimes he wakes due to pain.  Some times, "I just wake up."  Review of Systems  Constitutional: Negative.  Negative for chills, fever and weight loss.  HENT: Negative.   Respiratory: Negative.  Negative for cough.   Cardiovascular: Negative.  Negative for chest pain.  Gastrointestinal: Negative.  Negative for blood in stool, constipation, diarrhea, melena, nausea and vomiting.  Genitourinary: Negative.   Musculoskeletal: Positive for joint pain (chronic).  Skin: Negative.   Neurological: Negative.  Negative for weakness.  Endo/Heme/Allergies: Negative.   Psychiatric/Behavioral: The patient has insomnia.     Past Medical History:  Diagnosis Date  . Anemia   . Aortic stenosis, moderate 05/21/2015  .  Asthma   . B12 deficiency 07/28/2016  . Cellulitis of leg, left 01/03/2015  . Cervical spondylosis without myelopathy   . Cholelithiasis   . Chronic atrial fibrillation ([place])   . Chronic diastolic heart failure ([place])   . Chronic lower back pain   . Cirrhosis ([place])    Questionable, AFP normal Feb 01/2012, hx of ETOH use   . COPD (chronic obstructive pulmonary disease) ([facility])    Uses occasional nighttime O2  . Diffuse large B cell lymphoma ([place]) 08/22/2015    . Disseminated herpes zoster 2010  . DNR (do not resuscitate) 11/01/2015  . Dysrhythmia    chronic AFib  . Essential hypertension, benign   . GERD (gastroesophageal reflux disease)   . Headache(784.0)   . History of chicken pox 1941; 2011  . Iron deficiency anemia 01/18/2012  . Lupus anticoagulant positive   . Mixed hyperlipidemia   . Pulmonary embolus ([place])    2003 and 2011  . Pulmonary nodules    Chest CT 09/11  . Rectal ulcer 12/01/2015  . Rheumatoid arthritis(714.0)   . Seizure disorder ([place]) 09/2015  . Stage III chronic kidney disease   . Telangiectasia 11/29/2015   ANTRUM AND JEJUNUM    Past Surgical History:  Procedure Laterality Date  . BACK SURGERY    . CATARACT EXTRACTION W/ INTRAOCULAR LENS  IMPLANT, BILATERAL    . CHOLECYSTECTOMY  05/23/2012   Procedure: LAPAROSCOPIC CHOLECYSTECTOMY WITH INTRAOPERATIVE CHOLANGIOGRAM;  Surgeon: Stark Klein, MD;  Location: [place];  Service: General;  Laterality: N/A;  . COLONOSCOPY  01/24/2013   EXN:TZGYFVC polyps/colonic diverticulosis  . COLONOSCOPY N/A 12/01/2015   Procedure: COLONOSCOPY;  Surgeon: Danie Binder, MD;  [facility];  Service: Endoscopy;  Laterality: N/A;  . ESOPHAGOGASTRODUODENOSCOPY  02/02/12   BSW:HQPRFFMB size hiatal hernia; otherwise normal exam  . ESOPHAGOGASTRODUODENOSCOPY N/A 11/29/2015   WGY:[id number] diverticulosis/small internal hemorrhoids/left colon is redundant  . LYMPH NODE BIOPSY    . LYMPH NODE BIOPSY Right 09/11/2015   Procedure: RIGHT INGUINAL LYMPH NODE BIOPSY;  Surgeon: Aviva Signs, MD;  Location: [facility];  Service: General;  Laterality: Right;  . MYRINGOTOMY     "3 times; both ears"  . POLYPECTOMY  02/02/2012   RMR: Multiple colonic polyps removed, flat, tubular adenomas/ Left-sided diverticulosis  . PORTACATH PLACEMENT    . PORTACATH PLACEMENT Left 09/11/2015   Procedure: INSERTION PORT-A-CATH;  Surgeon: Aviva Signs, MD;  Location: [facility];  Service: General;  Laterality: Left;  .  POSTERIOR LUMBAR VERTEBRAE EXCISION  2003; 2009; 2011    Family History  Problem Relation Age of Onset  . Diabetes Mother   . Colon cancer Neg Hx   . Liver disease Neg Hx     Social History   Social History  . Marital status: Widowed    Spouse name: N/A  . Number of children: 6  . Years of education: 7th   Occupational History  . retired Retired  . RETIRED Retired   Social History Main Topics  . Smoking status: Former Smoker    Packs/day: 1.00    Years: 57.00    Types: Cigarettes    Quit date: 01/27/1998  . Smokeless tobacco: Never Used  . Alcohol use No     Comment: "quit drinking 1976"  . Drug use: No  . Sexual activity: Not Currently   Other Topics Concern  . None   Social History Narrative  . None     PHYSICAL EXAMINATION  ECOG PERFORMANCE STATUS: 2 -  Symptomatic, <50% confined to bed  Vitals:   02/22/17 1340  BP: 95/65  Pulse: 67  Resp: 18  Temp: 97.7 F (36.5 C)    GENERAL:alert, no distress, cooperative and accompanied by daughter, in wheelchair. SKIN: skin color, texture, turgor are normal. HEAD: Normocephalic EYES: normal, EOMI EARS: External ears normal OROPHARYNX:mucous membranes are moist  NECK: supple, trachea midline LYMPH:  No lymphadenopathy noted in cervical, axillary, or clavicular areas BREAST:not examined LUNGS: positive findings: decreased breath sounds bilaterally. HEART: irregularly irregular without murmur, rub, or gallop. ABDOMEN:abdomen soft without tenderness to palpation. BACK: Back symmetric, no curvature. EXTREMITIES:less then 2 second capillary refill, effusion, or inflammation, no skin discoloration.  Multiple hand/finger joint inflammation with some mild deformities. NEURO: alert & oriented x 3 with fluent speech, in wheelchair   LABORATORY DATA: CBC    Component Value Date/Time   WBC 7.7 02/22/2017 1420   RBC 4.54 02/22/2017 1420   HGB 13.7 02/22/2017 1420   HCT 42.3 02/22/2017 1420   HCT 34 01/11/2012 1007    PLT 103 (L) 02/22/2017 1420   MCV 93.2 02/22/2017 1420   MCV 92.0 01/11/2012 1007   MCH 30.2 02/22/2017 1420   MCHC 32.4 02/22/2017 1420   RDW 17.0 (H) 02/22/2017 1420   LYMPHSABS 0.6 (L) 02/22/2017 1420   MONOABS 0.6 02/22/2017 1420   EOSABS 0.1 02/22/2017 1420   BASOSABS 0.0 02/22/2017 1420      Chemistry      Component Value Date/Time   NA 138 02/22/2017 1420   NA 140 01/11/2012 1005   K 4.3 02/22/2017 1420   K 4.5 01/11/2012 1005   CL 102 02/22/2017 1420   CO2 29 02/22/2017 1420   BUN 14 02/22/2017 1420   BUN 12 01/11/2012 1005   CREATININE 0.98 02/22/2017 1420   CREATININE 1.17 01/11/2012 1005      Component Value Date/Time   CALCIUM 8.9 02/22/2017 1420   CALCIUM 9.2 01/11/2012 1005   ALKPHOS 60 02/22/2017 1420   AST 28 02/22/2017 1420   ALT 16 (L) 02/22/2017 1420   BILITOT 0.9 02/22/2017 1420      Lab Results  Component Value Date   IRON 10 (L) 11/09/2016   TIBC 290 11/09/2016   FERRITIN 236 11/09/2016     PENDING LABS:   RADIOGRAPHIC STUDIES:  No results found.   PATHOLOGY:    ASSESSMENT AND PLAN:  Diffuse large B-cell lymphoma of lymph nodes of multiple regions ([place]) Stage IV DLBCL with positive involvement of bone marrow.  He is S/P 1 cycle of R-mini-CHOP but issues with tolerance was appreciated.  His treatment was transitioned to palliative management with single-agent Rituxan.  This too was discontinued due to intolerance.  Despite bone marrow involvement, he is not a candidate for IT therapy given high likelihood of intolerance.  Complete metabolic response to therapy identified on PET imaging on 07/15/2016.  He is not a candidate for future systemic treatment due to performance status and intolerance.  Oncology history is updated.  Labs today: CBC diff, CMET, LDH, B2M, anemia panel.  I personally reviewed and went over laboratory results with the patient.  The results are noted within this dictation.    Labs in 4 months: CBC diff, CMET,  LDH, anemia panel, B2M.  His RA, symptomatically, is the patient's biggest complaint.  I will refer him back to his rheumatologist, Dr. Charlestine Night, to discuss treatment options.  He notes an upcoming appointment with Dr. Charlestine Night.  He will discuss his insomnia with his  primary care provider.  He has an appointment upcoming.  I will defer management of this issue to Dr. Edrick Oh.  For his headache, I will give his Fioricet today.  If effective, I will defer further management by PCP.  He will need ongoing port flushes every 6 weeks.  Return in 4 months for follow-up.  B12 deficiency B12 deficiency, on B12 replacement IM.  Completed weekly injections x 4.  Now on monthly injections.  Last B12 injection was in October 2017 due to patient noncompliance: Oncology Flowsheet 10/14/2016  cyanocobalamin ((VITAMIN B-12)) IM 1,000 mcg   Supportive therapy plan is reviewed.  B12 injection today and monthly.  Folate deficiency Folate deficiency.  On PO replacement.   ORDERS PLACED FOR THIS ENCOUNTER: Orders Placed This Encounter  Procedures  . CBC with Differential  . Comprehensive metabolic panel  . Lactate dehydrogenase  . Vitamin B12  . Folate  . Iron and TIBC  . Ferritin  . Beta 2 microglobuline, serum  . CBC with Differential  . Comprehensive metabolic panel  . Lactate dehydrogenase  . Vitamin B12  . Folate  . Iron and TIBC  . Ferritin  . Schedule Portacath Flush Appointment  . SCHEDULING COMMUNICATION INJECTION    MEDICATIONS PRESCRIBED THIS ENCOUNTER: Meds ordered this encounter  Medications  . butalbital-acetaminophen-caffeine (FIORICET, ESGIC) 50-325-40 MG per tablet 1 tablet  . heparin lock flush 100 unit/mL  . sodium chloride flush (NS) 0.9 % injection 10 mL  . cyanocobalamin ((VITAMIN B-12)) injection 1,000 mcg    THERAPY PLAN:  We will work-up acute onset of significant anemia.  All questions were answered. The patient knows to call the clinic with any problems,  questions or concerns. We can certainly see the patient much sooner if necessary.  Patient and plan discussed with Dr. Twana First and she is in agreement with the aforementioned.   This note is electronically signed by: Doy Mince 02/22/2017 5:46 PM

## 2017-02-22 NOTE — Progress Notes (Signed)
Pt given B12 injection in left deltoid. Pt tolerated well. Pt to continue monthly B12 injections.  Port flushed today as well. Labs drawn and sent to the lab for resulting. Portacath located left chest wall accessed with  H 20 needle.  Good blood return present. Portacath flushed with 93ml NS and 500U/69ml Heparin and needle removed intact.  Procedure tolerated well and without incident.    Pt also given Fioricet PO for headache.   Pt tolerated all of the above well with no complaints. Pt stable and discharged home with daughter in wheelchair.

## 2017-02-22 NOTE — Assessment & Plan Note (Addendum)
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  He is S/P 1 cycle of R-mini-CHOP but issues with tolerance was appreciated.  His treatment was transitioned to palliative management with single-agent Rituxan.  This too was discontinued due to intolerance.  Despite bone marrow involvement, he is not a candidate for IT therapy given high likelihood of intolerance.  Complete metabolic response to therapy identified on PET imaging on 07/15/2016.  He is not a candidate for future systemic treatment due to performance status and intolerance.  Oncology history is updated.  Labs today: CBC diff, CMET, LDH, B2M, anemia panel.  I personally reviewed and went over laboratory results with the patient.  The results are noted within this dictation.    Labs in 4 months: CBC diff, CMET, LDH, anemia panel, B2M.  His RA, symptomatically, is the patient's biggest complaint.  I will refer him back to his rheumatologist, Dr. Charlestine Night, to discuss treatment options.  He notes an upcoming appointment with Dr. Charlestine Night.  He will discuss his insomnia with his primary care provider.  He has an appointment upcoming.  I will defer management of this issue to Dr. Edrick Oh.  For his headache, I will give his Fioricet today.  If effective, I will defer further management by PCP.  He will need ongoing port flushes every 6 weeks.  Return in 4 months for follow-up.

## 2017-02-22 NOTE — Assessment & Plan Note (Deleted)
B12 deficiency, on B12 replacement IM.  Completed weekly injections x 4.  Now on monthly injections.  Last B12 injection was in October 2017 due to patient noncompliance: Oncology Flowsheet 10/14/2016  cyanocobalamin ((VITAMIN B-12)) IM 1,000 mcg   Supportive therapy plan is reviewed.  B12 injection today and monthly.

## 2017-02-22 NOTE — Assessment & Plan Note (Signed)
Folate deficiency.  On PO replacement.

## 2017-02-22 NOTE — Patient Instructions (Signed)
[place] at [facility] Discharge Instructions  RECOMMENDATIONS MADE BY THE CONSULTANT AND ANY TEST RESULTS WILL BE SENT TO YOUR REFERRING PHYSICIAN.  You were seen today by Kirby Crigler PA-C. Fioricet given, B12 injection given, port flush done, labs done. Follow up with Primary care doctor. Return in 4 months for labs and follow up.   Thank you for choosing [place] at [facility] to provide your oncology and hematology care.  To afford each patient quality time with our provider, please arrive at least 15 minutes before your scheduled appointment time.    If you have a lab appointment with the [facility] please come in thru the  Main Entrance and check in at the main information desk  You need to re-schedule your appointment should you arrive 10 or more minutes late.  We strive to give you quality time with our providers, and arriving late affects you and other patients whose appointments are after yours.  Also, if you no show three or more times for appointments you may be dismissed from the clinic at the providers discretion.     Again, thank you for choosing [facility].  Our hope is that these requests will decrease the amount of time that you wait before being seen by our physicians.       _____________________________________________________________  Should you have questions after your visit to [facility], please contact our office at [phone] between the hours of 8:30 a.m. and 4:30 p.m.  Voicemails left after 4:30 p.m. will not be returned until the following business day.  For prescription refill requests, have your pharmacy contact our office.       Resources For Cancer Patients and their Caregivers ? American Cancer Society: Can assist with transportation, wigs, general needs, runs Look Good Feel Better.        919-372-1623 ? Cancer Care: Provides financial assistance, online support  groups, medication/co-pay assistance.  1-800-813-HOPE 502 659 7831) ? [facility] Assists [place] cancer patients and their families through emotional , educational and financial support.  705-578-4674 ? [place] DSS Where to apply for food stamps, Medicaid and utility assistance. 312-458-3941 ? [facility]: Transportation to medical appointments. 313-720-3895 ? Social Security Administration: May apply for disability if have a Stage IV cancer. 302-669-6098 425-732-8226 ? [employer], Disability and Transit Services: Assists with nutrition, care and transit needs. [place] Support Programs: @10RELATIVEDAYS @ > Cancer Support Group  2nd Tuesday of the month 1pm-2pm, Journey Room  > Creative Journey  3rd Tuesday of the month 1130am-1pm, Journey Room  > Look Good Feel Better  1st Wednesday of the month 10am-12 noon, Journey Room (Call [place] to register [phone])

## 2017-02-22 NOTE — Assessment & Plan Note (Signed)
B12 deficiency, on B12 replacement IM.  Completed weekly injections x 4.  Now on monthly injections.  Last B12 injection was in October 2017 due to patient noncompliance: Oncology Flowsheet 10/14/2016  cyanocobalamin ((VITAMIN B-12)) IM 1,000 mcg   Supportive therapy plan is reviewed.  B12 injection today and monthly.

## 2017-02-23 ENCOUNTER — Other Ambulatory Visit (HOSPITAL_COMMUNITY): Payer: Self-pay

## 2017-02-23 DIAGNOSIS — C8338 Diffuse large B-cell lymphoma, lymph nodes of multiple sites: Secondary | ICD-10-CM

## 2017-02-23 DIAGNOSIS — D649 Anemia, unspecified: Secondary | ICD-10-CM

## 2017-02-23 LAB — BETA 2 MICROGLOBULIN, SERUM: Beta-2 Microglobulin: 3.7 mg/L — ABNORMAL HIGH (ref 0.6–2.4)

## 2017-03-01 ENCOUNTER — Encounter (HOSPITAL_COMMUNITY): Payer: Medicare Other

## 2017-03-15 ENCOUNTER — Other Ambulatory Visit (HOSPITAL_COMMUNITY): Payer: Medicare Other

## 2017-03-23 ENCOUNTER — Ambulatory Visit (HOSPITAL_COMMUNITY): Payer: Medicare Other

## 2017-03-29 ENCOUNTER — Encounter (HOSPITAL_COMMUNITY): Payer: Medicare Other | Attending: Oncology

## 2017-03-29 VITALS — BP 105/58 | HR 85 | Temp 97.8°F | Resp 20

## 2017-03-29 DIAGNOSIS — E538 Deficiency of other specified B group vitamins: Secondary | ICD-10-CM

## 2017-03-29 DIAGNOSIS — M059 Rheumatoid arthritis with rheumatoid factor, unspecified: Secondary | ICD-10-CM

## 2017-03-29 DIAGNOSIS — M47812 Spondylosis without myelopathy or radiculopathy, cervical region: Secondary | ICD-10-CM

## 2017-03-29 MED ORDER — CYANOCOBALAMIN 1000 MCG/ML IJ SOLN
1000.0000 ug | Freq: Once | INTRAMUSCULAR | Status: AC
  Administered 2017-03-29: 1000 ug via INTRAMUSCULAR
  Filled 2017-03-29: qty 1

## 2017-03-29 MED ORDER — OXYCODONE HCL 10 MG PO TABS: 10.0000 mg | ORAL_TABLET | Freq: Four times a day (QID) | ORAL | 0 refills | Status: DC | PRN

## 2017-03-29 NOTE — Progress Notes (Signed)
[name] presents today for injection per MD orders. Vit b12 1027mcg administered IM in left Upper Arm. Administration without incident. Patient tolerated well.

## 2017-04-03 ENCOUNTER — Ambulatory Visit (HOSPITAL_COMMUNITY): Payer: Medicare Other

## 2017-04-19 ENCOUNTER — Encounter (HOSPITAL_COMMUNITY): Payer: Medicare Other

## 2017-04-28 ENCOUNTER — Encounter (HOSPITAL_COMMUNITY): Payer: Self-pay | Admitting: Adult Health

## 2017-04-28 ENCOUNTER — Encounter (HOSPITAL_COMMUNITY): Payer: Self-pay

## 2017-04-28 ENCOUNTER — Encounter (HOSPITAL_COMMUNITY): Payer: Medicare Other | Attending: Oncology

## 2017-04-28 ENCOUNTER — Other Ambulatory Visit (HOSPITAL_COMMUNITY): Payer: Self-pay | Admitting: Adult Health

## 2017-04-28 VITALS — BP 101/71 | HR 63 | Temp 97.8°F | Resp 18

## 2017-04-28 DIAGNOSIS — M47812 Spondylosis without myelopathy or radiculopathy, cervical region: Secondary | ICD-10-CM

## 2017-04-28 DIAGNOSIS — D649 Anemia, unspecified: Secondary | ICD-10-CM | POA: Insufficient documentation

## 2017-04-28 DIAGNOSIS — E538 Deficiency of other specified B group vitamins: Secondary | ICD-10-CM

## 2017-04-28 DIAGNOSIS — D508 Other iron deficiency anemias: Secondary | ICD-10-CM | POA: Insufficient documentation

## 2017-04-28 DIAGNOSIS — Z95828 Presence of other vascular implants and grafts: Secondary | ICD-10-CM

## 2017-04-28 DIAGNOSIS — C8338 Diffuse large B-cell lymphoma, lymph nodes of multiple sites: Secondary | ICD-10-CM | POA: Insufficient documentation

## 2017-04-28 DIAGNOSIS — M059 Rheumatoid arthritis with rheumatoid factor, unspecified: Secondary | ICD-10-CM

## 2017-04-28 MED ORDER — HEPARIN SOD (PORK) LOCK FLUSH 100 UNIT/ML IV SOLN
500.0000 [IU] | Freq: Once | INTRAVENOUS | Status: AC
  Administered 2017-04-28: 500 [IU] via INTRAVENOUS
  Filled 2017-04-28: qty 5

## 2017-04-28 MED ORDER — CYANOCOBALAMIN 1000 MCG/ML IJ SOLN
1000.0000 ug | Freq: Once | INTRAMUSCULAR | Status: AC
  Administered 2017-04-28: 1000 ug via INTRAMUSCULAR
  Filled 2017-04-28: qty 1

## 2017-04-28 MED ORDER — OXYCODONE HCL 10 MG PO TABS: 10.0000 mg | ORAL_TABLET | Freq: Four times a day (QID) | ORAL | 0 refills | Status: DC | PRN

## 2017-04-28 MED ORDER — SODIUM CHLORIDE 0.9% FLUSH
10.0000 mL | INTRAVENOUS | Status: DC | PRN
  Administered 2017-04-28: 10 mL via INTRAVENOUS
  Filled 2017-04-28: qty 10

## 2017-04-28 NOTE — Progress Notes (Signed)
Patient here at cancer center for injection and port flush. He is requesting refill of Oxycodone.   [place] Controlled Substance Reporting System reviewed and refill appropriate on 05/03/17 or after.  Paper prescription printed and post-dated.     NCCSRS reviewed:    Mike Craze, NP [place] [phone]

## 2017-04-28 NOTE — Progress Notes (Signed)
[name] presented for Portacath access and flush.  Portacath located left chest wall accessed with  H 20 needle.  No blood return and port flushed without difficulty, patient denies any pain at site, no signs of infiltration. Portacath flushed with 76ml NS and 500U/61ml Heparin and needle removed intact.  Procedure tolerated well and without incident.   [name] presents today for injection per MD orders. B12 1000 mcg administered IM in left deltoid. Administration without incident. Patient tolerated well. Patient discharged via wheelchair in stable condition from clinic.

## 2017-05-23 ENCOUNTER — Ambulatory Visit (HOSPITAL_COMMUNITY): Payer: Medicare Other

## 2017-05-29 ENCOUNTER — Encounter (HOSPITAL_COMMUNITY): Payer: Self-pay

## 2017-05-29 ENCOUNTER — Encounter (HOSPITAL_COMMUNITY): Payer: Medicare Other | Attending: Oncology

## 2017-05-29 VITALS — BP 142/78 | HR 78 | Temp 98.0°F | Resp 18

## 2017-05-29 DIAGNOSIS — E538 Deficiency of other specified B group vitamins: Secondary | ICD-10-CM | POA: Diagnosis not present

## 2017-05-29 DIAGNOSIS — M059 Rheumatoid arthritis with rheumatoid factor, unspecified: Secondary | ICD-10-CM

## 2017-05-29 DIAGNOSIS — M47812 Spondylosis without myelopathy or radiculopathy, cervical region: Secondary | ICD-10-CM

## 2017-05-29 DIAGNOSIS — D649 Anemia, unspecified: Secondary | ICD-10-CM | POA: Insufficient documentation

## 2017-05-29 DIAGNOSIS — D508 Other iron deficiency anemias: Secondary | ICD-10-CM | POA: Insufficient documentation

## 2017-05-29 DIAGNOSIS — C8338 Diffuse large B-cell lymphoma, lymph nodes of multiple sites: Secondary | ICD-10-CM | POA: Insufficient documentation

## 2017-05-29 MED ORDER — CYANOCOBALAMIN 1000 MCG/ML IJ SOLN
INTRAMUSCULAR | Status: AC
  Filled 2017-05-29: qty 1

## 2017-05-29 MED ORDER — OXYCODONE HCL 10 MG PO TABS: 10.0000 mg | ORAL_TABLET | Freq: Four times a day (QID) | ORAL | 0 refills | Status: DC | PRN

## 2017-05-29 MED ORDER — CYANOCOBALAMIN 1000 MCG/ML IJ SOLN
1000.0000 ug | Freq: Once | INTRAMUSCULAR | Status: AC
  Administered 2017-05-29: 1000 ug via INTRAMUSCULAR

## 2017-05-29 NOTE — Progress Notes (Signed)
[name] presents today for injection per MD orders. B12 1000 mcg administered IM in left deltoid. Administration without incident. Patient tolerated well. Patient stable and discharged via wheelchair to caregiver.

## 2017-05-29 NOTE — Patient Instructions (Signed)
[facility] at [facility] Discharge Instructions  RECOMMENDATIONS MADE BY THE CONSULTANT AND ANY TEST RESULTS WILL BE SENT TO YOUR REFERRING PHYSICIAN.  Vitamin B12 1000 mcg injection given as ordered. Return as scheduled.  Thank you for choosing [facility] at [facility] to provide your oncology and hematology care.  To afford each patient quality time with our provider, please arrive at least 15 minutes before your scheduled appointment time.    If you have a lab appointment with the Cancer Center please come in thru the  Main Entrance and check in at the main information desk  You need to re-schedule your appointment should you arrive 10 or more minutes late.  We strive to give you quality time with our providers, and arriving late affects you and other patients whose appointments are after yours.  Also, if you no show three or more times for appointments you may be dismissed from the clinic at the providers discretion.     Again, thank you for choosing [facility].  Our hope is that these requests will decrease the amount of time that you wait before being seen by our physicians.       _____________________________________________________________  Should you have questions after your visit to  Cancer Center, please contact our office at [phone] between the hours of 8:30 a.m. and 4:30 p.m.  Voicemails left after 4:30 p.m. will not be returned until the following business day.  For prescription refill requests, have your pharmacy contact our office.       Resources For Cancer Patients and their Caregivers ? American Cancer Society: Can assist with transportation, wigs, general needs, runs Look Good Feel Better.        1-888-227-6333 ? Cancer Care: Provides financial assistance, online support groups, medication/co-pay assistance.  1-800-813-HOPE (4673) ? [facility] Assists [place] cancer patients and their families through emotional , educational and financial support.  336-427-4357 ? [place] DSS Where to apply for food stamps, Medicaid and utility assistance. 336-342-1394 ? [employer]: Transportation to medical appointments. 336-347-2287 ? Social Security Administration: May apply for disability if have a Stage IV cancer. 336-342-7796 1-800-772-1213 ? [place] Aging, Disability and Transit Services: Assists with nutrition, care and transit needs. 336-349-2343  Cancer Center Support Programs: @[email or web address]@ > Cancer Support Group  2nd Tuesday of the month 1pm-2pm, Journey Room  > Creative Journey  3rd Tuesday of the month 1130am-1pm, Journey Room  > Look Good Feel Better  1st Wednesday of the month 10am-12 noon, Journey Room (Call American Cancer Society to register 1-800-395-5775)   

## 2017-06-13 IMAGING — CT CT ABD-PELV W/O CM
2 of 7 series · 15 of 46 positions shown, 17 images · non-contrast
Comparison: 08/22/2015 chest CT angiogram. 07/05/2013 CT
abdomen/pelvis.

CLINICAL DATA: 84-year-old male with central right lower lobe mass
and mediastinal and upper retroperitoneal lymphadenopathy on recent
chest CT angiogram. Status post interval right thoracentesis.

EXAM:
CT ABDOMEN AND PELVIS WITHOUT CONTRAST
TECHNIQUE: Multidetector CT imaging of the abdomen and pelvis was performed
following the standard protocol without IV contrast.

[Series 2: abdomen/pelvis w/o contrast · axial · non-contrast · 0.89mm/px · z∈[-434,-28]mm · 12 of 93 slices shown, 14 images]
[im 6/93  soft-tissue]
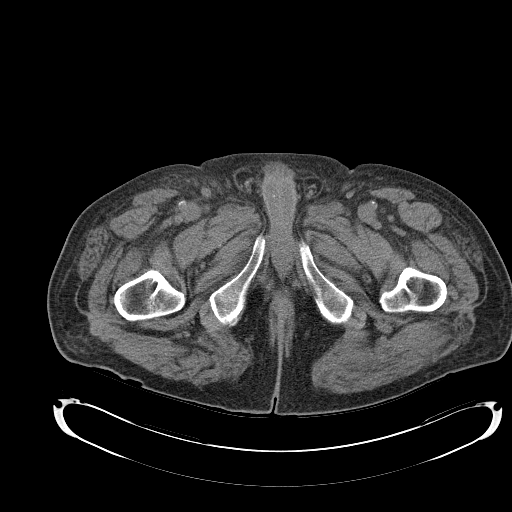
[im 6/93  bone]
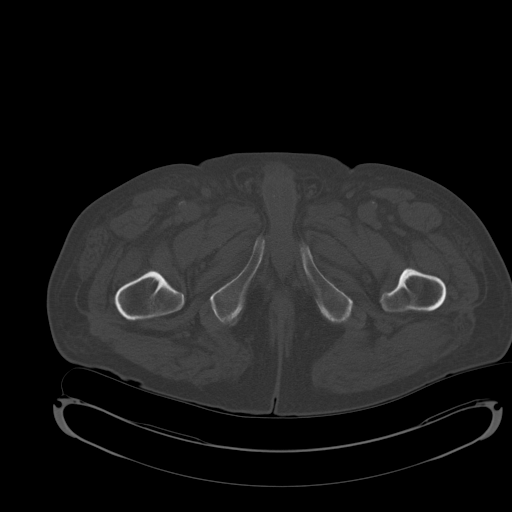
[im 16/93  soft-tissue]
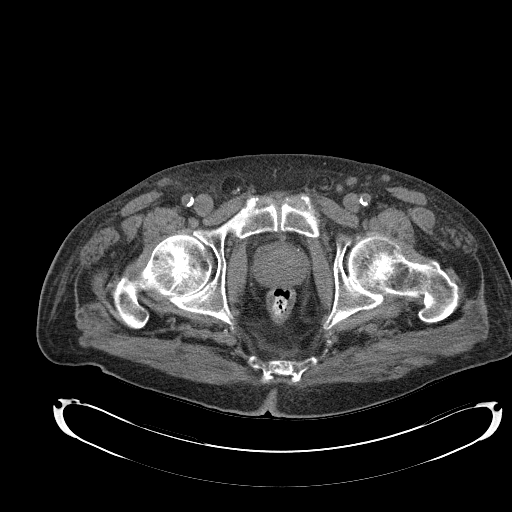
[im 21/93  soft-tissue]
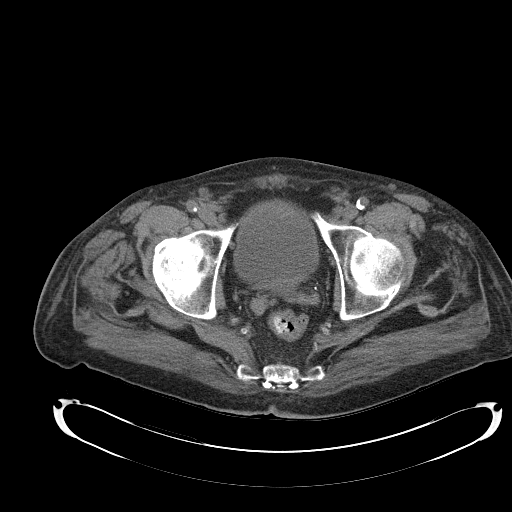
[im 26/93  soft-tissue]
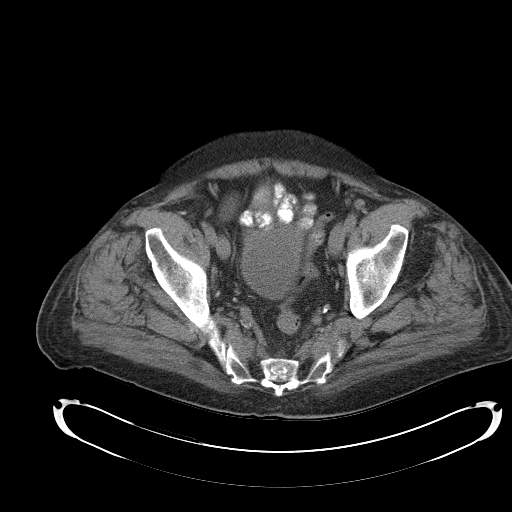
[im 36/93  soft-tissue]
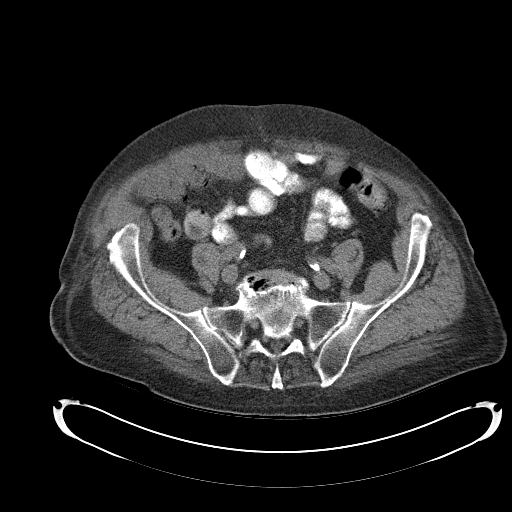
[im 41/93  soft-tissue]
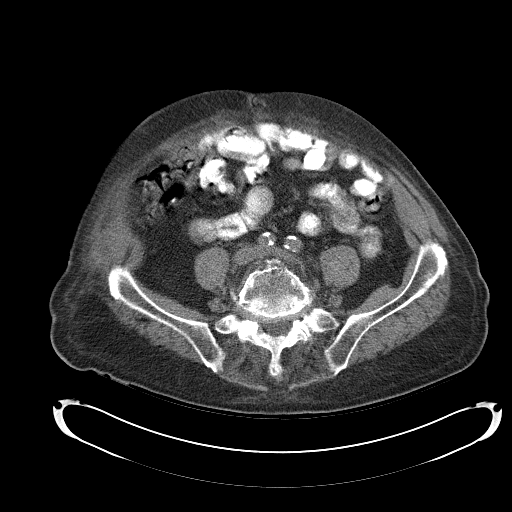
[im 52/93  soft-tissue]
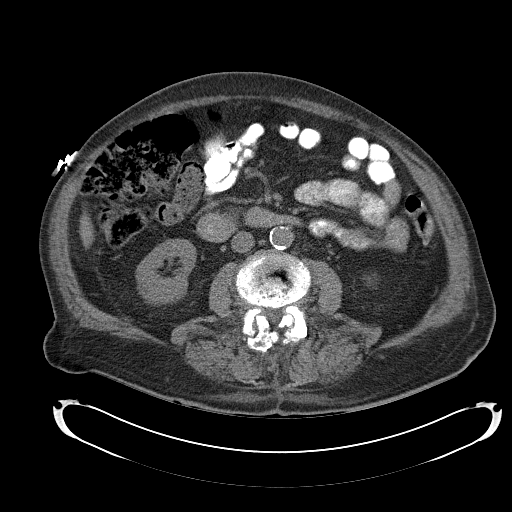
[im 57/93  soft-tissue]
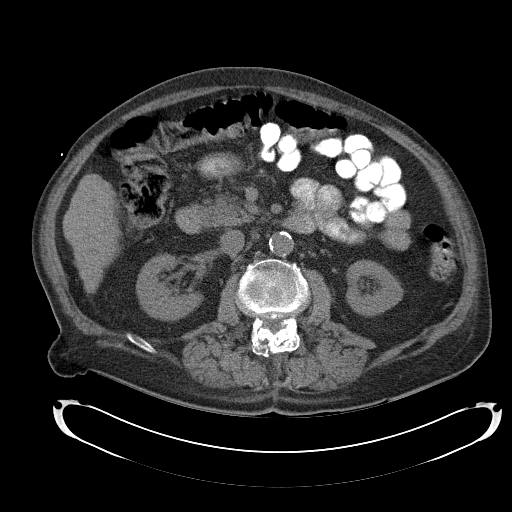
[im 67/93  soft-tissue]
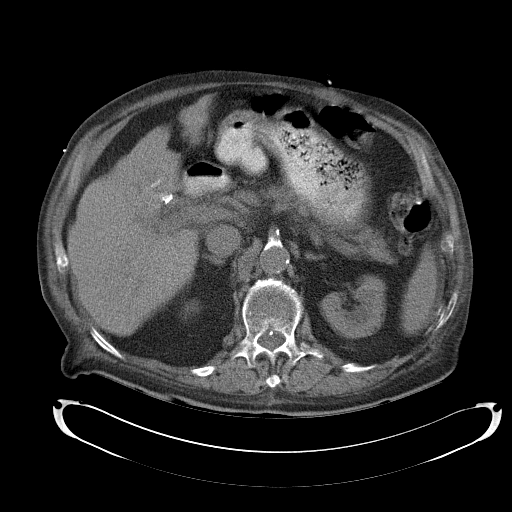
[im 67/93  bone]
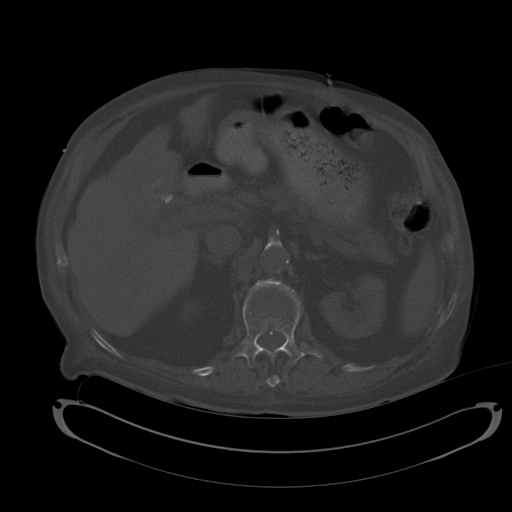
[im 72/93  soft-tissue]
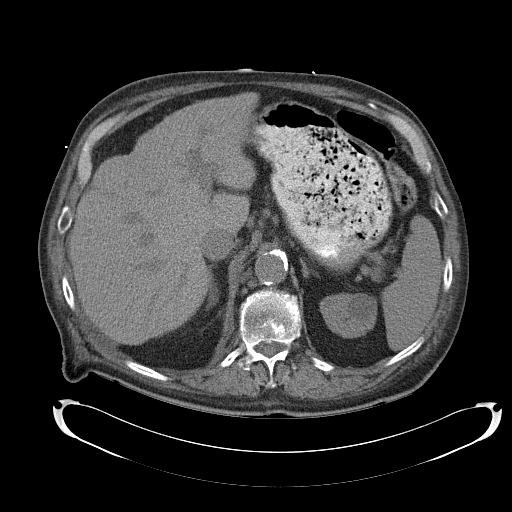
[im 77/93  soft-tissue]
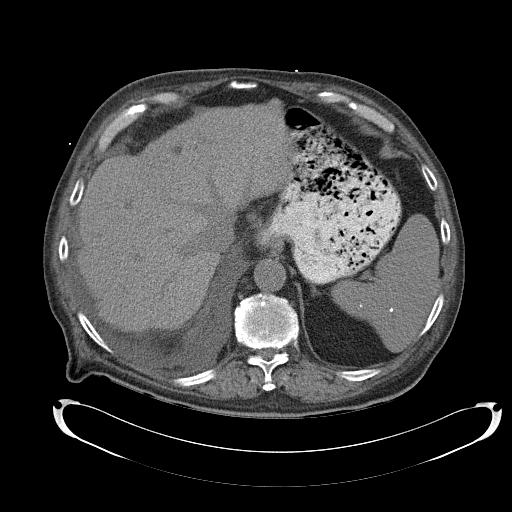
[im 87/93  soft-tissue]
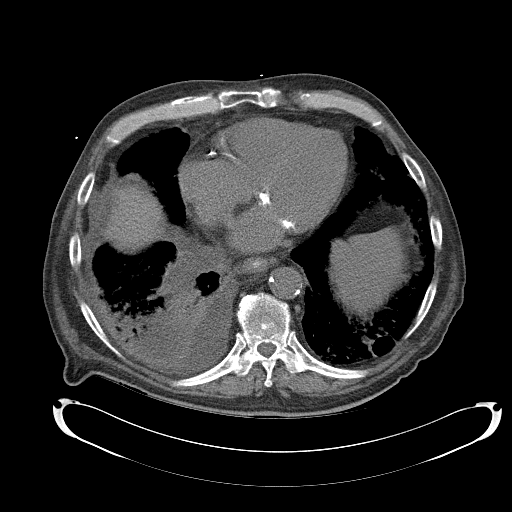

[Series 4: mpr cor 3.0mm · coronal · 0.81mm/px · 3 of 102 slices shown]
[im 26/102  soft-tissue]
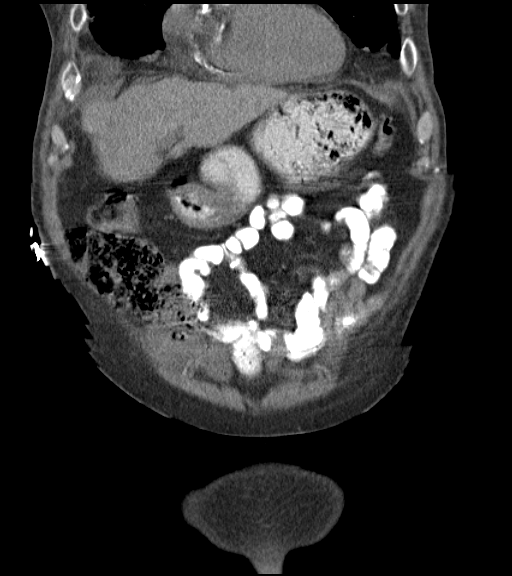
[im 51/102  soft-tissue]
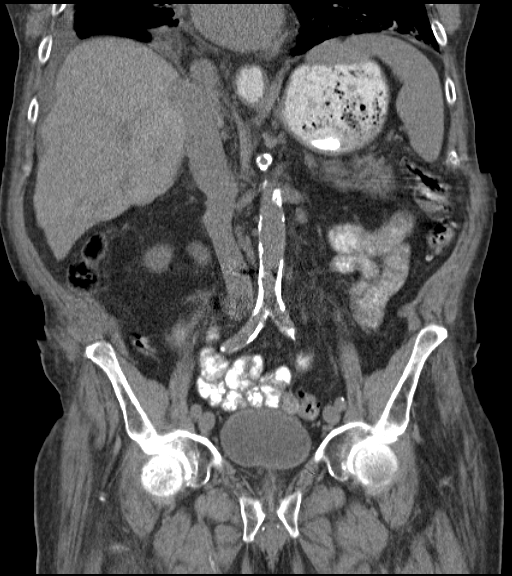
[im 76/102  soft-tissue]
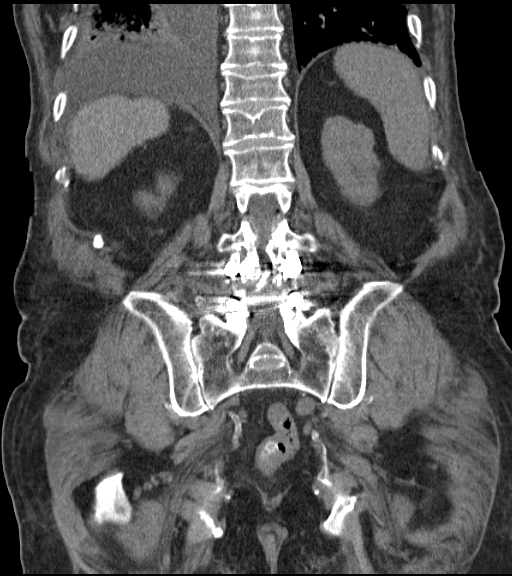

[15 of 46 positions shown; findings below may reference images not displayed]

FINDINGS: Images are partially motion degraded.

Lower chest: Small right pleural effusion, decreased since
08/22/2015. Re- demonstrated are right coronary artery
calcifications and aortic valvular and mitral annular
calcifications. Partially visualized is the 4.0 cm central right
lower lobe lung mass (series 2/image 2). Partially visualized is the
subcarinal lymphadenopathy. Re- demonstrated is volume loss and
patchy consolidation in the basilar right lower lobe, representing
atelectasis and/or postobstructive pneumonia. Re- demonstrated is
subpleural reticulation with associated ground-glass opacity and
microcystic changes throughout the visualized lung bases, in keeping
with an underlying interstitial lung disease which is incompletely
evaluated on this study. Oral contrast is present in the lower
thoracic esophageal lumen, in keeping with esophageal dysmotility
and/or gastroesophageal reflux.

Hepatobiliary: There are 3 small simple cysts in the left liver
lobe, largest 2.0 cm, as characterized on the 04/12/2012 MRI study.
There are no new liver lesions. The liver surface is diffusely
irregular, in keeping with cirrhosis. Status post cholecystectomy.
No intrahepatic or extrahepatic biliary ductal dilatation.

Pancreas: Normal.

Spleen: Normal size spleen (craniocaudal splenic length 11.3 cm).
Punctate granulomatous calcifications throughout the spleen. No
splenic mass.

Adrenals/Urinary Tract: Normal adrenals. Bilateral simple renal
cysts, largest 2.3 cm in the lateral lower right kidney and 2.4 cm
in the upper left kidney. No hydronephrosis. No nephrolithiasis.
Normal caliber ureters. Normal urinary bladder.

Stomach/Bowel: Normal stomach. Normal caliber small bowel. The
appendix is not discretely visualized. Mild diverticulosis of the
descending and sigmoid colon. No large bowel wall thickening or
pericolonic fat stranding.

Vascular/Lymphatic: Atherosclerotic nonaneurysmal abdominal aorta.
Top-normal size 1.2 cm short axis right inguinal lymph node. Mildly
enlarged 1.1 cm right retrocrural lymph node (series 2/image 21).
Multiple mildly prominent gastrohepatic ligament lymph nodes,
largest 0.9 cm short axis ([DATE]).

Reproductive: Mild prostatomegaly, unchanged.

Other: No pneumoperitoneum, ascites or focal fluid collection.

Musculoskeletal: Status post bilateral posterior lumbar spine fusion
at L4-5. Marked degenerative changes throughout the visualized
thoracolumbar spine. No aggressive appearing focal osseous lesions.
IMPRESSION: 1. Mild upper retroperitoneal (gastrohepatic ligament and right
retrocrural) lymphadenopathy.
2. Cirrhosis. No suspicious liver lesions on this noncontrast study.
No ascites. Normal size spleen.
3. No acute abnormality in the abdomen or pelvis. No evidence of
bowel obstruction or acute bowel inflammation.
4. Partially visualized central right lower lobe lung mass and
subcarinal lymphadenopathy, suspicious for primary lung neoplasm and
metastatic adenopathy.
5. Small right pleural effusion, decreased. Persistent patchy
consolidation and volume loss in the right lower lobe, in keeping
with atelectasis and/or postobstructive pneumonia.
6. Underlying interstitial lung disease in the visualized lung
bases, incompletely characterized on this study.

## 2017-06-22 ENCOUNTER — Ambulatory Visit (HOSPITAL_COMMUNITY): Payer: Medicare Other | Admitting: Oncology

## 2017-06-22 ENCOUNTER — Encounter (HOSPITAL_COMMUNITY): Payer: Medicare Other

## 2017-06-29 ENCOUNTER — Ambulatory Visit (HOSPITAL_COMMUNITY): Payer: Medicare Other | Admitting: Oncology

## 2017-06-29 ENCOUNTER — Ambulatory Visit (HOSPITAL_COMMUNITY): Payer: Medicare Other

## 2017-06-29 ENCOUNTER — Encounter (HOSPITAL_COMMUNITY): Payer: Medicare Other

## 2017-06-29 ENCOUNTER — Telehealth (HOSPITAL_COMMUNITY): Payer: Self-pay | Admitting: *Deleted

## 2017-06-29 ENCOUNTER — Other Ambulatory Visit (HOSPITAL_COMMUNITY): Payer: Self-pay | Admitting: Oncology

## 2017-06-29 DIAGNOSIS — M059 Rheumatoid arthritis with rheumatoid factor, unspecified: Secondary | ICD-10-CM

## 2017-06-29 DIAGNOSIS — M47812 Spondylosis without myelopathy or radiculopathy, cervical region: Secondary | ICD-10-CM

## 2017-06-29 MED ORDER — OXYCODONE HCL 10 MG PO TABS: 10.0000 mg | ORAL_TABLET | Freq: Four times a day (QID) | ORAL | 0 refills | Status: DC | PRN

## 2017-07-03 ENCOUNTER — Encounter (HOSPITAL_COMMUNITY): Payer: Medicare Other

## 2017-07-03 ENCOUNTER — Ambulatory Visit (HOSPITAL_COMMUNITY): Payer: Medicare Other | Admitting: Oncology

## 2017-07-13 ENCOUNTER — Encounter (HOSPITAL_BASED_OUTPATIENT_CLINIC_OR_DEPARTMENT_OTHER): Payer: Medicare Other

## 2017-07-13 ENCOUNTER — Encounter (HOSPITAL_COMMUNITY): Payer: Medicare Other | Attending: Oncology | Admitting: Oncology

## 2017-07-13 ENCOUNTER — Encounter (HOSPITAL_COMMUNITY): Payer: Self-pay

## 2017-07-13 ENCOUNTER — Encounter (HOSPITAL_COMMUNITY): Payer: Medicare Other

## 2017-07-13 VITALS — BP 106/59 | HR 78 | Temp 97.4°F | Resp 18 | Wt 204.7 lb

## 2017-07-13 DIAGNOSIS — E538 Deficiency of other specified B group vitamins: Secondary | ICD-10-CM

## 2017-07-13 DIAGNOSIS — D638 Anemia in other chronic diseases classified elsewhere: Secondary | ICD-10-CM | POA: Diagnosis not present

## 2017-07-13 DIAGNOSIS — C8338 Diffuse large B-cell lymphoma, lymph nodes of multiple sites: Secondary | ICD-10-CM | POA: Diagnosis not present

## 2017-07-13 DIAGNOSIS — D649 Anemia, unspecified: Secondary | ICD-10-CM | POA: Insufficient documentation

## 2017-07-13 DIAGNOSIS — D508 Other iron deficiency anemias: Secondary | ICD-10-CM | POA: Diagnosis present

## 2017-07-13 DIAGNOSIS — Z95828 Presence of other vascular implants and grafts: Secondary | ICD-10-CM

## 2017-07-13 DIAGNOSIS — Z8572 Personal history of non-Hodgkin lymphomas: Secondary | ICD-10-CM | POA: Diagnosis not present

## 2017-07-13 DIAGNOSIS — M069 Rheumatoid arthritis, unspecified: Secondary | ICD-10-CM | POA: Diagnosis not present

## 2017-07-13 LAB — CBC WITH DIFFERENTIAL/PLATELET
Basophils Absolute: 0 10*3/uL (ref 0.0–0.1)
Basophils Relative: 0 %
EOS ABS: 0.3 10*3/uL (ref 0.0–0.7)
EOS PCT: 4 %
HCT: 42.4 % (ref 39.0–52.0)
HEMOGLOBIN: 13.8 g/dL (ref 13.0–17.0)
LYMPHS ABS: 1.4 10*3/uL (ref 0.7–4.0)
Lymphocytes Relative: 16 %
MCH: 32.5 pg (ref 26.0–34.0)
MCHC: 32.5 g/dL (ref 30.0–36.0)
MCV: 99.8 fL (ref 78.0–100.0)
MONOS PCT: 7 %
Monocytes Absolute: 0.6 10*3/uL (ref 0.1–1.0)
Neutro Abs: 6.4 10*3/uL (ref 1.7–7.7)
Neutrophils Relative %: 73 %
PLATELETS: 125 10*3/uL — AB (ref 150–400)
RBC: 4.25 MIL/uL (ref 4.22–5.81)
RDW: 14.2 % (ref 11.5–15.5)
WBC: 8.6 10*3/uL (ref 4.0–10.5)

## 2017-07-13 LAB — COMPREHENSIVE METABOLIC PANEL
ALK PHOS: 62 U/L (ref 38–126)
ALT: 13 U/L — ABNORMAL LOW (ref 17–63)
ANION GAP: 8 (ref 5–15)
AST: 21 U/L (ref 15–41)
Albumin: 3.3 g/dL — ABNORMAL LOW (ref 3.5–5.0)
BUN: 14 mg/dL (ref 6–20)
CALCIUM: 9.2 mg/dL (ref 8.9–10.3)
CO2: 30 mmol/L (ref 22–32)
Chloride: 102 mmol/L (ref 101–111)
Creatinine, Ser: 1.21 mg/dL (ref 0.61–1.24)
GFR calc non Af Amer: 54 mL/min — ABNORMAL LOW (ref >60)
Glucose, Bld: 97 mg/dL (ref 65–99)
Potassium: 3.7 mmol/L (ref 3.5–5.1)
SODIUM: 140 mmol/L (ref 135–145)
Total Bilirubin: 0.7 mg/dL (ref 0.3–1.2)
Total Protein: 7.5 g/dL (ref 6.5–8.1)

## 2017-07-13 LAB — LACTATE DEHYDROGENASE: LDH: 174 U/L (ref 98–192)

## 2017-07-13 LAB — VITAMIN B12

## 2017-07-13 MED ORDER — HEPARIN SOD (PORK) LOCK FLUSH 100 UNIT/ML IV SOLN
500.0000 [IU] | Freq: Once | INTRAVENOUS | Status: AC
  Administered 2017-07-13: 500 [IU] via INTRAVENOUS

## 2017-07-13 MED ORDER — CYANOCOBALAMIN 1000 MCG/ML IJ SOLN
INTRAMUSCULAR | Status: AC
  Filled 2017-07-13: qty 1

## 2017-07-13 MED ORDER — SODIUM CHLORIDE 0.9% FLUSH
10.0000 mL | INTRAVENOUS | Status: DC | PRN
  Administered 2017-07-13: 10 mL via INTRAVENOUS
  Filled 2017-07-13: qty 10

## 2017-07-13 MED ORDER — CYANOCOBALAMIN 1000 MCG/ML IJ SOLN
1000.0000 ug | Freq: Once | INTRAMUSCULAR | Status: AC
  Administered 2017-07-13: 1000 ug via INTRAMUSCULAR

## 2017-07-13 MED ORDER — HEPARIN SOD (PORK) LOCK FLUSH 100 UNIT/ML IV SOLN
INTRAVENOUS | Status: AC
  Filled 2017-07-13: qty 5

## 2017-07-13 NOTE — Progress Notes (Signed)
[name] tolerated port flush and Vit B12 injection well without complaints or incident. Port accessed with 20 gauge needle without blood return but flushed easily with 10 ml NS and 5 ml Heparin without complaints of discomfort or swelling then de-accessed. Pt discharged via wheelchair in satisfactory condition accompanied by his wife and pt to go by lab on the way out for blood to be drawn peripherally for labs ordered

## 2017-07-13 NOTE — Patient Instructions (Signed)
[facility] at [facility] Discharge Instructions  RECOMMENDATIONS MADE BY THE CONSULTANT AND ANY TEST RESULTS WILL BE SENT TO YOUR REFERRING PHYSICIAN.  You saw Dr. Zhou today.  Thank you for choosing [facility] at [facility] to provide your oncology and hematology care.  To afford each patient quality time with our provider, please arrive at least 15 minutes before your scheduled appointment time.    If you have a lab appointment with the Cancer Center please come in thru the  Main Entrance and check in at the main information desk  You need to re-schedule your appointment should you arrive 10 or more minutes late.  We strive to give you quality time with our providers, and arriving late affects you and other patients whose appointments are after yours.  Also, if you no show three or more times for appointments you may be dismissed from the clinic at the providers discretion.     Again, thank you for choosing [facility].  Our hope is that these requests will decrease the amount of time that you wait before being seen by our physicians.       _____________________________________________________________  Should you have questions after your visit to [facility], please contact our office at [phone] between the hours of 8:30 a.m. and 4:30 p.m.  Voicemails left after 4:30 p.m. will not be returned until the following business day.  For prescription refill requests, have your pharmacy contact our office.       Resources For Cancer Patients and their Caregivers ? American Cancer Society: Can assist with transportation, wigs, general needs, runs Look Good Feel Better.        1-888-227-6333 ? Cancer Care: Provides financial assistance, online support groups, medication/co-pay assistance.  1-800-813-HOPE (4673) ? [facility] Assists [place] cancer patients and their families through  emotional , educational and financial support.  336-427-4357 ? [place] DSS Where to apply for food stamps, Medicaid and utility assistance. 336-342-1394 ? [employer]: Transportation to medical appointments. 336-347-2287 ? Social Security Administration: May apply for disability if have a Stage IV cancer. 336-342-7796 1-800-772-1213 ? [place] Aging, Disability and Transit Services: Assists with nutrition, care and transit needs. 336-349-2343  Cancer Center Support Programs: @[email or web address]@ > Cancer Support Group  2nd Tuesday of the month 1pm-2pm, Journey Room  > Creative Journey  3rd Tuesday of the month 1130am-1pm, Journey Room  > Look Good Feel Better  1st Wednesday of the month 10am-12 noon, Journey Room (Call American Cancer Society to register 1-800-395-5775)    

## 2017-07-13 NOTE — Patient Instructions (Signed)
[place] at [facility] Discharge Instructions  RECOMMENDATIONS MADE BY THE CONSULTANT AND ANY TEST RESULTS WILL BE SENT TO YOUR REFERRING PHYSICIAN.  Received Vit B12 injection and port flush per protocol today. Follow-up as scheduled. Call clinic for any questions or concerns  Thank you for choosing [place] at [facility] to provide your oncology and hematology care.  To afford each patient quality time with our provider, please arrive at least 15 minutes before your scheduled appointment time.    If you have a lab appointment with the [facility] please come in thru the  Main Entrance and check in at the main information desk  You need to re-schedule your appointment should you arrive 10 or more minutes late.  We strive to give you quality time with our providers, and arriving late affects you and other patients whose appointments are after yours.  Also, if you no show three or more times for appointments you may be dismissed from the clinic at the providers discretion.     Again, thank you for choosing [facility].  Our hope is that these requests will decrease the amount of time that you wait before being seen by our physicians.       _____________________________________________________________  Should you have questions after your visit to [facility], please contact our office at [phone] between the hours of 8:30 a.m. and 4:30 p.m.  Voicemails left after 4:30 p.m. will not be returned until the following business day.  For prescription refill requests, have your pharmacy contact our office.       Resources For Cancer Patients and their Caregivers ? American Cancer Society: Can assist with transportation, wigs, general needs, runs Look Good Feel Better.        986-713-0416 ? Cancer Care: Provides financial assistance, online support groups, medication/co-pay assistance.  1-800-813-HOPE  (732)272-6699) ? [place] Assists [place] cancer patients and their families through emotional , educational and financial support.  605-152-4023 ? [place] DSS Where to apply for food stamps, Medicaid and utility assistance. 305-668-5332 ? [employer]: Transportation to medical appointments. 864-495-0429 ? Social Security Administration: May apply for disability if have a Stage IV cancer. 787-379-4404 (361)364-7410 ? [employer], Disability and Transit Services: Assists with nutrition, care and transit needs. [place] Support Programs: @10RELATIVEDAYS @ > Cancer Support Group  2nd Tuesday of the month 1pm-2pm, Journey Room  > Creative Journey  3rd Tuesday of the month 1130am-1pm, Journey Room  > Look Good Feel Better  1st Wednesday of the month 10am-12 noon, Journey Room (Call [place] to register [phone])

## 2017-07-13 NOTE — Progress Notes (Signed)
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  He is S/P 1 cycle of R-mini-CHOP but issues with tolerance was appreciated.  His treatment was transitioned to palliative management with single-agent Rituxan.  This too was discontinued due to intolerance.  Despite bone marrow involvement, he is not a candidate for IT therapy given high likelihood of intolerance.  Complete metabolic response to therapy identified on PET imaging on 07/15/2016. AND Significant anemia, in the setting of RA, with a history of iron deficiency anemia, having required PRBCs in the past.    Diffuse large B-cell lymphoma of lymph nodes of multiple regions [facility])   08/22/2015 - 08/25/2015 Hospital Admission    Acute respiratory distress      08/22/2015 Imaging    CTA chest- Right infrahilar lymph node versus central lung mass measuring 2.8 x 4.1 cm. Worsening mediastinal adenopathy and bilateral hilar adenopathy as described with the largest node over the subcarinal region measuring 2.7 cm by short axis...      08/24/2015 Pathology Results    Diagnosis PLEURAL FLUID, RIGHT(SPECIMEN 1 OF 1 COLLECTED 08/24/15): ATYPICAL LYMPHOCYTES SUSPICIOUS FOR A LYMPHOPROLIFERATIVE PROCESS, SEE COMMENT. [name]      08/25/2015 Imaging    CT abd/pelvis- Mild upper retroperitoneal (gastrohepatic ligament and right retrocrural) lymphadenopathy. Cirrhosis.      09/03/2015 PET scan    Widespread metastatic adenopathy including the RIGHT cervical lymph nodes, LEFT axillary lymph nodes, mediastinal lymph, hilar lymph node, retroperitoneal lymph nodes and iliac lymph nodes and inguinal lymph nodes. Inguinal lymph nodes may be most....      09/11/2015 Pathology Results    Diagnosis Lymph node, biopsy, right inguinal - DIFFUSE LARGE B CELL LYMPHOMA.        09/11/2015 Pathology Results    Interpretation Tissue-Flow Cytometry - MONOCLONAL B-CELL POPULATION IDENTIFIED. Diagnosis Comment: The findings are consistent with non-Hodgkin B-cell lymphoma. (BNS:ecj 09/16/2015)      09/13/2015 - 09/18/2015 Hospital Admission    COPD exacerbation      09/18/2015 Bone Marrow Biopsy    Bone Marrow, Aspirate,Biopsy, and Clot, left iliac crest: Despite limited findings, the features are worrisome for minimal involvement by B cell lymphoproliferative process, particularly given the previous history of large B cell lymphoma.       09/21/2015 - 10/11/2015 Chemotherapy    Mini-R-CHOP      09/29/2015 - 10/02/2015 Hospital Admission    SOB, anemia, dehydration, recurrent pleural effusion (right) Direct admit from [facility].  S/p 5 units of PRBCs, therapeutic thoracentesis on right relieveing 1.5 L of fluid, adjustment of pain medication.      10/12/2015 Treatment Plan Change    Patient intolerant to R-mini-CHOP.  Trantition to palliative treatment with single-agent Rituxan.      10/12/2015 - 11/02/2015 Chemotherapy    Rituxan single-agent      10/18/2015 - 10/21/2015 Hospital Admission    1.   Acute encephalopathy 2.   New onset seizure disorder      10/19/2015 Imaging    EEG- This recording is abnormal for the following reasons: Single epileptiform discharge involving the right anterior temporal area. This can correlates clinically with focal seizures.      11/28/2015 - 12/03/2015 Hospital Admission    Hospital Admission for BRBPR      07/15/2016 PET scan  Complete interval metabolic response to therapy. No metabolically active lymphoma identified. Persistent moderate right pleural effusion, right lung atelectasis, and emphysema.      07/28/2016 Procedure    A total of approximately 1.3 L of RIGHT pleural fluid was removed.      08/02/2016 Pathology Results    PLEURAL FLUID, RIGHT (SPECIMEN 1 OF 1 COLLECTED 07/28/16): - REACTIVE MESOTHELIAL CELLS PRESENT  ADMIXED WITH LYMPHOCYTES. Tissue-Flow Cytometry - NO MONOCLONAL B CELL POPULATION IDENTIFIED. - T CELLS WITH NONSPECIFIC CHANGES.      Today patient presents with his daughter for continued follow up. He states that his main problem is his joint pains from RA. He states that the pain is mainly in his hands but sometimes it was travel up his arms to his shoulders, to his knees. He states that his appetite varies, some days he has a great appetite other days he does not want to eat anything. He continues to have generalized weakness and states that he spends most of his day either sitting around her laying down. He denies any drenching night sweats, unexplained weight loss, fevers or chills.   Review of Systems  Constitutional: Negative.  Negative for chills, fever and weight loss.  HENT: Negative.   Respiratory: Negative.  Negative for cough.   Cardiovascular: Negative.  Negative for chest pain.  Gastrointestinal: Negative.  Negative for blood in stool, constipation, diarrhea, melena, nausea and vomiting.  Genitourinary: Negative.   Musculoskeletal: Positive for joint pain (chronic).  Skin: Negative.   Neurological: Negative.  Negative for weakness.  Endo/Heme/Allergies: Negative.   Psychiatric/Behavioral: The patient does not have insomnia.     Past Medical History:  Diagnosis Date  . Anemia   . Aortic stenosis, moderate 05/21/2015  . Asthma   . B12 deficiency 07/28/2016  . Cellulitis of leg, left 01/03/2015  . Cervical spondylosis without myelopathy   . Cholelithiasis   . Chronic atrial fibrillation ([place])   . Chronic diastolic heart failure ([place])   . Chronic lower back pain   . Cirrhosis ([place])    Questionable, AFP normal Feb 01/2012, hx of ETOH use   . COPD (chronic obstructive pulmonary disease) (H[facility])    Uses occasional nighttime O2  . Diffuse large B cell lymphoma ([place]) 08/22/2015  . Disseminated herpes zoster 2010  . DNR (do not resuscitate) 11/01/2015  . Dysrhythmia     chronic AFib  . Essential hypertension, benign   . GERD (gastroesophageal reflux disease)   . Headache(784.0)   . History of chicken pox 1941; 2011  . Iron deficiency anemia 01/18/2012  . Lupus anticoagulant positive   . Mixed hyperlipidemia   . Pulmonary embolus ([place])    2003 and 2011  . Pulmonary nodules    Chest CT 09/11  . Rectal ulcer 12/01/2015  . Rheumatoid arthritis(714.0)   . Seizure disorder ([place]) 09/2015  . Stage III chronic kidney disease   . Telangiectasia 11/29/2015   ANTRUM AND JEJUNUM    Past Surgical History:  Procedure Laterality Date  . BACK SURGERY    . CATARACT EXTRACTION W/ INTRAOCULAR LENS  IMPLANT, BILATERAL    . CHOLECYSTECTOMY  05/23/2012   Procedure: LAPAROSCOPIC CHOLECYSTECTOMY WITH INTRAOPERATIVE CHOLANGIOGRAM;  Surgeon: Stark Klein, MD;  Location[facility];  Service: General;  Laterality: N/A;  . COLONOSCOPY  01/24/2013   VVZ:SMOLMBE polyps/colonic diverticulosis  . COLONOSCOPY N/A 12/01/2015   Procedure: COLONOSCOPY;  Surgeon: Danie Binder, MD;  [facility];  Service: Endoscopy;  Laterality: N/A;  .  ESOPHAGOGASTRODUODENOSCOPY  02/02/12   YIF:OYDXAJOI size hiatal hernia; otherwise normal exam  . ESOPHAGOGASTRODUODENOSCOPY N/A 11/29/2015   NOM:VEHMCNOB diverticulosis/small internal hemorrhoids/left colon is redundant  . LYMPH NODE BIOPSY    . LYMPH NODE BIOPSY Right 09/11/2015   Procedure: RIGHT INGUINAL LYMPH NODE BIOPSY;  Surgeon: Aviva Signs, MD;  Location: [facility];  Service: General;  Laterality: Right;  . MYRINGOTOMY     "3 times; both ears"  . POLYPECTOMY  02/02/2012   RMR: Multiple colonic polyps removed, flat, tubular adenomas/ Left-sided diverticulosis  . PORTACATH PLACEMENT    . PORTACATH PLACEMENT Left 09/11/2015   Procedure: INSERTION PORT-A-CATH;  Surgeon: Aviva Signs, MD;  Location: [facility];  Service: General;  Laterality: Left;  . POSTERIOR LUMBAR VERTEBRAE EXCISION  2003; 2009; 2011    Family History  Problem Relation Age of  Onset  . Diabetes Mother   . Colon cancer Neg Hx   . Liver disease Neg Hx     Social History   Social History  . Marital status: Widowed    Spouse name: N/A  . Number of children: 6  . Years of education: 7th   Occupational History  . retired Retired  . RETIRED Retired   Social History Main Topics  . Smoking status: Former Smoker    Packs/day: 1.00    Years: 57.00    Types: Cigarettes    Quit date: 01/27/1998  . Smokeless tobacco: Never Used  . Alcohol use No     Comment: "quit drinking 1976"  . Drug use: No  . Sexual activity: Not Currently   Other Topics Concern  . Not on file   Social History Narrative  . No narrative on file     PHYSICAL EXAMINATION  ECOG PERFORMANCE STATUS: 3 - Symptomatic, >50% confined to bed  Vitals:   07/13/17 1333  BP: (!) 106/59  Pulse: 78  Resp: 18  Temp: (!) 97.4 F (36.3 C)    GENERAL:alert, no distress, cooperative and accompanied by daughter, in wheelchair. SKIN: skin color, texture, turgor are normal. HEAD: Normocephalic EYES: normal, EOMI EARS: External ears normal OROPHARYNX:mucous membranes are moist  NECK: supple, trachea midline LYMPH:  No lymphadenopathy noted in cervical, axillary, or clavicular areas LUNGS: CTA bilaterally HEART: irregularly irregular without murmur, rub, or gallop. ABDOMEN:abdomen soft without tenderness to palpation. BACK: Back symmetric, no curvature. EXTREMITIES:less then 2 second capillary refill, effusion, or inflammation, no skin discoloration.   NEURO: alert & oriented x 3 with fluent speech, in wheelchair   LABORATORY DATA: CBC    Component Value Date/Time   WBC 7.7 02/22/2017 1420   RBC 4.54 02/22/2017 1420   HGB 13.7 02/22/2017 1420   HCT 42.3 02/22/2017 1420   HCT 34 01/11/2012 1007   PLT 103 (L) 02/22/2017 1420   MCV 93.2 02/22/2017 1420   MCV 92.0 01/11/2012 1007   MCH 30.2 02/22/2017 1420   MCHC 32.4 02/22/2017 1420   RDW 17.0 (H) 02/22/2017 1420   LYMPHSABS 0.6 (L)  02/22/2017 1420   MONOABS 0.6 02/22/2017 1420   EOSABS 0.1 02/22/2017 1420   BASOSABS 0.0 02/22/2017 1420      Chemistry      Component Value Date/Time   NA 138 02/22/2017 1420   NA 140 01/11/2012 1005   K 4.3 02/22/2017 1420   K 4.5 01/11/2012 1005   CL 102 02/22/2017 1420   CO2 29 02/22/2017 1420   BUN 14 02/22/2017 1420   BUN 12 01/11/2012 1005   CREATININE 0.98 02/22/2017  1420   CREATININE 1.17 01/11/2012 1005      Component Value Date/Time   CALCIUM 8.9 02/22/2017 1420   CALCIUM 9.2 01/11/2012 1005   ALKPHOS 60 02/22/2017 1420   AST 28 02/22/2017 1420   ALT 16 (L) 02/22/2017 1420   BILITOT 0.9 02/22/2017 1420      Lab Results  Component Value Date   IRON 10 (L) 11/09/2016   TIBC 290 11/09/2016   FERRITIN 236 11/09/2016     PENDING LABS:   RADIOGRAPHIC STUDIES:  No results found.   PATHOLOGY:    ASSESSMENT AND PLAN:  1.Stage IV Diffuse large B cell lymphoma with bone marrow involvement.  Clinically NED. Continue surveillance.  Patient is a poor candidate for future systemic therapy if he should recur given his overall poor performance status and weakened state. Continue surveillance per NCCN guidelines with H and P and labs every 3-6 months for the first 5 years and then annually thereafter. CT scans only as clinically indicated. Return to clinic in 4 months for follow-up with repeat CBC, CMP, LDH. Port flush today and every 2 months. We'll plan to take out his chemotherapy port once he is a year out with no evidence of recurrence.  2. Vitamin D 12 deficiency Continue monthly vitamin B-12 injections. We'll repeat his by B-12 level on his next visit.    All questions were answered. The patient knows to call the clinic with any problems, questions or concerns. We can certainly see the patient much sooner if necessary.   This note is electronically signed by: Twana First, MD 07/13/2017 1:34 PM

## 2017-08-10 ENCOUNTER — Ambulatory Visit (HOSPITAL_COMMUNITY): Payer: Medicare Other

## 2017-08-17 ENCOUNTER — Ambulatory Visit (HOSPITAL_COMMUNITY): Payer: Medicare Other

## 2017-08-25 ENCOUNTER — Encounter (HOSPITAL_COMMUNITY): Payer: Self-pay

## 2017-08-25 ENCOUNTER — Encounter (HOSPITAL_COMMUNITY): Payer: Medicare Other | Attending: Oncology

## 2017-08-25 ENCOUNTER — Other Ambulatory Visit (HOSPITAL_COMMUNITY): Payer: Self-pay | Admitting: Adult Health

## 2017-08-25 VITALS — BP 129/58 | HR 96 | Temp 97.7°F | Resp 18

## 2017-08-25 DIAGNOSIS — D508 Other iron deficiency anemias: Secondary | ICD-10-CM | POA: Insufficient documentation

## 2017-08-25 DIAGNOSIS — M059 Rheumatoid arthritis with rheumatoid factor, unspecified: Secondary | ICD-10-CM

## 2017-08-25 DIAGNOSIS — D649 Anemia, unspecified: Secondary | ICD-10-CM | POA: Insufficient documentation

## 2017-08-25 DIAGNOSIS — E538 Deficiency of other specified B group vitamins: Secondary | ICD-10-CM | POA: Insufficient documentation

## 2017-08-25 DIAGNOSIS — C8338 Diffuse large B-cell lymphoma, lymph nodes of multiple sites: Secondary | ICD-10-CM | POA: Insufficient documentation

## 2017-08-25 DIAGNOSIS — M47812 Spondylosis without myelopathy or radiculopathy, cervical region: Secondary | ICD-10-CM

## 2017-08-25 MED ORDER — CYANOCOBALAMIN 1000 MCG/ML IJ SOLN
1000.0000 ug | Freq: Once | INTRAMUSCULAR | Status: AC
  Administered 2017-08-25: 1000 ug via INTRAMUSCULAR
  Filled 2017-08-25: qty 1

## 2017-08-25 MED ORDER — OXYCODONE HCL 10 MG PO TABS: 10.0000 mg | ORAL_TABLET | Freq: Four times a day (QID) | ORAL | 0 refills | Status: DC | PRN

## 2017-08-25 NOTE — Patient Instructions (Signed)
[place] at [facility]  Discharge Instructions:  B12 shot today.  Keep scheduled appointments and call for any concerns or questions.  _______________________________________________________________  Thank you for choosing [place] at [facility] to provide your oncology and hematology care.  To afford each patient quality time with our providers, please arrive at least 15 minutes before your scheduled appointment.  You need to re-schedule your appointment if you arrive 10 or more minutes late.  We strive to give you quality time with our providers, and arriving late affects you and other patients whose appointments are after yours.  Also, if you no show three or more times for appointments you may be dismissed from the clinic.  Again, thank you for choosing [place] at [place] hope is that these requests will allow you access to exceptional care and in a timely manner. _______________________________________________________________  If you have questions after your visit, please contact our office at [phone] between the hours of 8:30 a.m. and 5:00 p.m. Voicemails left after 4:30 p.m. will not be returned until the following business day. _______________________________________________________________  For prescription refill requests, have your pharmacy contact our office. _______________________________________________________________  Recommendations made by the consultant and any test results will be sent to your referring physician. _______________________________________________________________

## 2017-08-25 NOTE — Progress Notes (Signed)
Patient tolerated B12 shot with no complaints.  Site clean and dry with band aid applied.  Refill for oxycodone today.  VSS with discharge.  Left in wheelchair with family.  No complaints voiced with discharge.

## 2017-09-07 ENCOUNTER — Encounter (HOSPITAL_COMMUNITY): Payer: Medicare Other

## 2017-09-14 ENCOUNTER — Encounter (HOSPITAL_COMMUNITY): Payer: Medicare Other

## 2017-09-22 ENCOUNTER — Encounter (HOSPITAL_COMMUNITY): Payer: Self-pay

## 2017-09-22 ENCOUNTER — Encounter (HOSPITAL_COMMUNITY): Payer: Medicare Other | Attending: Hematology and Oncology

## 2017-09-22 VITALS — BP 112/52 | HR 100 | Temp 98.3°F | Resp 18

## 2017-09-22 DIAGNOSIS — E538 Deficiency of other specified B group vitamins: Secondary | ICD-10-CM | POA: Diagnosis not present

## 2017-09-22 DIAGNOSIS — M47812 Spondylosis without myelopathy or radiculopathy, cervical region: Secondary | ICD-10-CM

## 2017-09-22 DIAGNOSIS — M059 Rheumatoid arthritis with rheumatoid factor, unspecified: Secondary | ICD-10-CM

## 2017-09-22 DIAGNOSIS — Z95828 Presence of other vascular implants and grafts: Secondary | ICD-10-CM

## 2017-09-22 MED ORDER — OXYCODONE HCL 10 MG PO TABS: 10.0000 mg | ORAL_TABLET | Freq: Four times a day (QID) | ORAL | 0 refills | Status: DC | PRN

## 2017-09-22 MED ORDER — CYANOCOBALAMIN 1000 MCG/ML IJ SOLN
1000.0000 ug | Freq: Once | INTRAMUSCULAR | Status: AC
  Administered 2017-09-22: 1000 ug via INTRAMUSCULAR

## 2017-09-22 MED ORDER — SODIUM CHLORIDE 0.9% FLUSH
10.0000 mL | INTRAVENOUS | Status: DC | PRN
  Administered 2017-09-22: 10 mL via INTRAVENOUS
  Filled 2017-09-22: qty 10

## 2017-09-22 MED ORDER — CYANOCOBALAMIN 1000 MCG/ML IJ SOLN
INTRAMUSCULAR | Status: AC
  Filled 2017-09-22: qty 1

## 2017-09-22 MED ORDER — HEPARIN SOD (PORK) LOCK FLUSH 100 UNIT/ML IV SOLN
500.0000 [IU] | Freq: Once | INTRAVENOUS | Status: AC
  Administered 2017-09-22: 500 [IU] via INTRAVENOUS

## 2017-09-22 NOTE — Progress Notes (Signed)
Patient needed refill on pain medication. Prescription entered for MD to sign. Dr. Lebron Conners would not sign prescription because he has not seen patient. Notified patient and wife. They verbalized understanding.

## 2017-09-22 NOTE — Progress Notes (Signed)
[name] tolerated Vit B12 injection and portacath flush well without complaints or incident. Port accessed with 20 gauge needle without blood return noted but flushed easily with 10 ml NS and 5 ml Heparin per protocol without complaints of discomfort then de-accessed. No swelling noted around port site when flushed. VSS Pt discharged via wheelchair in satisfactory condition accompanied by his daughter

## 2017-09-22 NOTE — Patient Instructions (Signed)
[place] at [facility] Discharge Instructions  RECOMMENDATIONS MADE BY THE CONSULTANT AND ANY TEST RESULTS WILL BE SENT TO YOUR REFERRING PHYSICIAN.  Received Vit B12 injection today with portacath flushed as well. Follow-up as scheduled. Call clinic for any questions or concerns  Thank you for choosing [place] at [facility] to provide your oncology and hematology care.  To afford each patient quality time with our provider, please arrive at least 15 minutes before your scheduled appointment time.    If you have a lab appointment with the [place] [facility] please come in thru the  Main Entrance and check in at the main information desk  You need to re-schedule your appointment should you arrive 10 or more minutes late.  We strive to give you quality time with our providers, and arriving late affects you and other patients whose appointments are after yours.  Also, if you no show three or more times for appointments you may be dismissed from the clinic at the providers discretion.     Again, thank you for choosing [facility].  Our hope is that these requests will decrease the amount of time that you wait before being seen by our physicians.       _____________________________________________________________  Should you have questions after your visit to [facility], please contact our office at [phone] between the hours of 8:30 a.m. and 4:30 p.m.  Voicemails left after 4:30 p.m. will not be returned until the following business day.  For prescription refill requests, have your pharmacy contact our office.       Resources For Cancer Patients and their Caregivers ? American Cancer Society: Can assist with transportation, wigs, general needs, runs Look Good Feel Better.        ([phone] 747-5932 ? Cancer Care: Provides financial assistance, online support groups, medication/co-pay assistance.  1-800-813-HOPE  (260) 760-7620) ? [place] Assists [place] cancer patients and their families through emotional , educational and financial support.  (618)485-6419 ? [place] DSS Where to apply for food stamps, Medicaid and utility assistance. 906-543-3550 ? RCATS: Transportation to medical appointments. 309-561-2762 ? Social Security Administration: May apply for disability if have a Stage IV cancer. 770 870 [phone] ? [employer], Disability and Transit Services: Assists with nutrition, care and transit needs. [place] Support Programs: @10RELATIVEDAYS @ > Cancer Support Group  2nd Tuesday of the month 1pm-2pm, Journey Room  > Creative Journey  3rd Tuesday of the month 1130am-1pm, Journey Room  > Look Good Feel Better  1st Wednesday of the month 10am-12 noon, Journey Room (Call [place] to register [phone])

## 2017-09-25 ENCOUNTER — Other Ambulatory Visit (HOSPITAL_COMMUNITY): Payer: Self-pay | Admitting: Oncology

## 2017-09-25 DIAGNOSIS — M059 Rheumatoid arthritis with rheumatoid factor, unspecified: Secondary | ICD-10-CM

## 2017-09-25 DIAGNOSIS — M47812 Spondylosis without myelopathy or radiculopathy, cervical region: Secondary | ICD-10-CM

## 2017-09-25 MED ORDER — OXYCODONE HCL 10 MG PO TABS: 10.0000 mg | ORAL_TABLET | Freq: Four times a day (QID) | ORAL | 0 refills | Status: DC | PRN

## 2017-09-26 ENCOUNTER — Other Ambulatory Visit (HOSPITAL_COMMUNITY): Payer: Self-pay | Admitting: Oncology

## 2017-09-26 DIAGNOSIS — E538 Deficiency of other specified B group vitamins: Secondary | ICD-10-CM

## 2017-10-05 ENCOUNTER — Ambulatory Visit (HOSPITAL_COMMUNITY): Payer: Medicare Other

## 2017-10-12 ENCOUNTER — Telehealth (HOSPITAL_COMMUNITY): Payer: Self-pay | Admitting: *Deleted

## 2017-10-12 ENCOUNTER — Other Ambulatory Visit (HOSPITAL_COMMUNITY): Payer: Self-pay | Admitting: Oncology

## 2017-10-12 ENCOUNTER — Ambulatory Visit (HOSPITAL_COMMUNITY): Payer: Medicare Other

## 2017-10-12 DIAGNOSIS — M059 Rheumatoid arthritis with rheumatoid factor, unspecified: Secondary | ICD-10-CM

## 2017-10-12 DIAGNOSIS — M47812 Spondylosis without myelopathy or radiculopathy, cervical region: Secondary | ICD-10-CM

## 2017-10-12 MED ORDER — OXYCODONE HCL 10 MG PO TABS: 10.0000 mg | ORAL_TABLET | Freq: Four times a day (QID) | ORAL | 0 refills | Status: DC | PRN

## 2017-10-20 ENCOUNTER — Ambulatory Visit (HOSPITAL_COMMUNITY): Payer: Medicare Other

## 2017-10-23 ENCOUNTER — Encounter (HOSPITAL_COMMUNITY): Payer: Medicare Other | Attending: Oncology

## 2017-10-23 DIAGNOSIS — D649 Anemia, unspecified: Secondary | ICD-10-CM | POA: Insufficient documentation

## 2017-10-23 DIAGNOSIS — C8338 Diffuse large B-cell lymphoma, lymph nodes of multiple sites: Secondary | ICD-10-CM | POA: Insufficient documentation

## 2017-10-23 DIAGNOSIS — E538 Deficiency of other specified B group vitamins: Secondary | ICD-10-CM | POA: Insufficient documentation

## 2017-10-23 DIAGNOSIS — D508 Other iron deficiency anemias: Secondary | ICD-10-CM | POA: Insufficient documentation

## 2017-10-24 ENCOUNTER — Other Ambulatory Visit (HOSPITAL_COMMUNITY): Payer: Self-pay | Admitting: Adult Health

## 2017-11-02 ENCOUNTER — Encounter (HOSPITAL_COMMUNITY): Payer: Medicare Other

## 2017-11-02 ENCOUNTER — Ambulatory Visit (HOSPITAL_COMMUNITY): Payer: Medicare Other

## 2017-11-09 ENCOUNTER — Ambulatory Visit (HOSPITAL_COMMUNITY): Payer: Medicare Other

## 2017-11-09 ENCOUNTER — Encounter (HOSPITAL_COMMUNITY): Payer: Medicare Other

## 2017-11-21 ENCOUNTER — Encounter (HOSPITAL_COMMUNITY): Payer: Medicare Other | Attending: Oncology | Admitting: Oncology

## 2017-11-21 ENCOUNTER — Encounter (HOSPITAL_COMMUNITY): Payer: Self-pay | Admitting: Oncology

## 2017-11-21 ENCOUNTER — Other Ambulatory Visit: Payer: Self-pay

## 2017-11-21 ENCOUNTER — Encounter (HOSPITAL_BASED_OUTPATIENT_CLINIC_OR_DEPARTMENT_OTHER): Payer: Medicare Other

## 2017-11-21 VITALS — BP 102/56 | HR 100 | Temp 97.6°F | Resp 20 | Wt 220.3 lb

## 2017-11-21 DIAGNOSIS — Z8572 Personal history of non-Hodgkin lymphomas: Secondary | ICD-10-CM | POA: Diagnosis not present

## 2017-11-21 DIAGNOSIS — D508 Other iron deficiency anemias: Secondary | ICD-10-CM | POA: Insufficient documentation

## 2017-11-21 DIAGNOSIS — E538 Deficiency of other specified B group vitamins: Secondary | ICD-10-CM | POA: Diagnosis not present

## 2017-11-21 DIAGNOSIS — D649 Anemia, unspecified: Secondary | ICD-10-CM | POA: Diagnosis present

## 2017-11-21 DIAGNOSIS — C8338 Diffuse large B-cell lymphoma, lymph nodes of multiple sites: Secondary | ICD-10-CM

## 2017-11-21 DIAGNOSIS — Z95828 Presence of other vascular implants and grafts: Secondary | ICD-10-CM

## 2017-11-21 LAB — CBC WITH DIFFERENTIAL/PLATELET
Basophils Absolute: 0 10*3/uL (ref 0.0–0.1)
Basophils Relative: 0 %
Eosinophils Absolute: 0.2 10*3/uL (ref 0.0–0.7)
Eosinophils Relative: 3 %
HEMATOCRIT: 45.6 % (ref 39.0–52.0)
HEMOGLOBIN: 14.2 g/dL (ref 13.0–17.0)
LYMPHS ABS: 0.8 10*3/uL (ref 0.7–4.0)
LYMPHS PCT: 10 %
MCH: 32.4 pg (ref 26.0–34.0)
MCHC: 31.1 g/dL (ref 30.0–36.0)
MCV: 104.1 fL — AB (ref 78.0–100.0)
MONO ABS: 0.5 10*3/uL (ref 0.1–1.0)
MONOS PCT: 7 %
NEUTROS ABS: 6.5 10*3/uL (ref 1.7–7.7)
NEUTROS PCT: 80 %
Platelets: 111 10*3/uL — ABNORMAL LOW (ref 150–400)
RBC: 4.38 MIL/uL (ref 4.22–5.81)
RDW: 14.2 % (ref 11.5–15.5)
WBC: 8.1 10*3/uL (ref 4.0–10.5)

## 2017-11-21 LAB — COMPREHENSIVE METABOLIC PANEL
ALK PHOS: 62 U/L (ref 38–126)
ALT: 20 U/L (ref 17–63)
ANION GAP: 8 (ref 5–15)
AST: 22 U/L (ref 15–41)
Albumin: 3.5 g/dL (ref 3.5–5.0)
BILIRUBIN TOTAL: 0.8 mg/dL (ref 0.3–1.2)
BUN: 20 mg/dL (ref 6–20)
CALCIUM: 9.1 mg/dL (ref 8.9–10.3)
CO2: 30 mmol/L (ref 22–32)
Chloride: 102 mmol/L (ref 101–111)
Creatinine, Ser: 1.25 mg/dL — ABNORMAL HIGH (ref 0.61–1.24)
GFR calc Af Amer: 59 mL/min — ABNORMAL LOW (ref >60)
GFR, EST NON AFRICAN AMERICAN: 51 mL/min — AB (ref >60)
GLUCOSE: 137 mg/dL — AB (ref 65–99)
POTASSIUM: 3.8 mmol/L (ref 3.5–5.1)
Sodium: 140 mmol/L (ref 135–145)
TOTAL PROTEIN: 7.7 g/dL (ref 6.5–8.1)

## 2017-11-21 LAB — VITAMIN B12

## 2017-11-21 LAB — LACTATE DEHYDROGENASE: LDH: 171 U/L (ref 98–192)

## 2017-11-21 MED ORDER — SODIUM CHLORIDE 0.9% FLUSH
10.0000 mL | INTRAVENOUS | Status: DC | PRN
  Administered 2017-11-21: 10 mL via INTRAVENOUS
  Filled 2017-11-21: qty 10

## 2017-11-21 MED ORDER — HEPARIN SOD (PORK) LOCK FLUSH 100 UNIT/ML IV SOLN
500.0000 [IU] | Freq: Once | INTRAVENOUS | Status: AC
  Administered 2017-11-21: 500 [IU] via INTRAVENOUS
  Filled 2017-11-21: qty 5

## 2017-11-21 MED ORDER — CYANOCOBALAMIN 1000 MCG/ML IJ SOLN
1000.0000 ug | Freq: Once | INTRAMUSCULAR | Status: AC
  Administered 2017-11-21: 1000 ug via INTRAMUSCULAR
  Filled 2017-11-21: qty 1

## 2017-11-21 NOTE — Progress Notes (Signed)
[name] presented for Portacath access and flush. Portacath located left chest wall accessed with  H 20 needle. No blood return and no resistance met Portacath flushed with 42m NS and 500U/55mHeparin and needle removed intact. Procedure without incident. Patient tolerated procedure well.   CuJanee Mornresents today for injection per MD orders. B12 1,00031mdministered IM in right Upper Arm. Administration without incident. Patient tolerated well.  Treatment given per orders. Patient tolerated it well without problems. Vitals stable and discharged home from clinic via wheelchair. Follow up as scheduled.

## 2017-11-21 NOTE — Patient Instructions (Signed)
[place] at [facility] Discharge Instructions  RECOMMENDATIONS MADE BY THE CONSULTANT AND ANY TEST RESULTS WILL BE SENT TO YOUR REFERRING PHYSICIAN.  Port flush with labs done. B12 injection done Follow up as scheduled  Thank you for choosing [place] at [facility] to provide your oncology and hematology care.  To afford each patient quality time with our provider, please arrive at least 15 minutes before your scheduled appointment time.    If you have a lab appointment with the [facility] please come in thru the  Main Entrance and check in at the main information desk  You need to re-schedule your appointment should you arrive 10 or more minutes late.  We strive to give you quality time with our providers, and arriving late affects you and other patients whose appointments are after yours.  Also, if you no show three or more times for appointments you may be dismissed from the clinic at the providers discretion.     Again, thank you for choosing [facility].  Our hope is that these requests will decrease the amount of time that you wait before being seen by our physicians.       _____________________________________________________________  Should you have questions after your visit to [facility], please contact our office at [phone] between the hours of 8:30 a.m. and 4:30 p.m.  Voicemails left after 4:30 p.m. will not be returned until the following business day.  For prescription refill requests, have your pharmacy contact our office.       Resources For Cancer Patients and their Caregivers ? American Cancer Society: Can assist with transportation, wigs, general needs, runs Look Good Feel Better.        309-783-0870 ? Cancer Care: Provides financial assistance, online support groups, medication/co-pay assistance.  1-800-813-HOPE 4403350178) ? [place] Assists [place] cancer patients and their families through emotional , educational and financial support.  6086045921 ? [place] DSS Where to apply for food stamps, Medicaid and utility assistance. (931)481-1877 ? RCATS: Transportation to medical appointments. 940-117-5503 ? Social Security Administration: May apply for disability if have a Stage IV cancer. 931-576-0608 228-126-7276 ? [employer], Disability and Transit Services: Assists with nutrition, care and transit needs. [place] Support Programs: @10RELATIVEDAYS @ > Cancer Support Group  2nd Tuesday of the month 1pm-2pm, Journey Room  > Creative Journey  3rd Tuesday of the month 1130am-1pm, Journey Room  > Look Good Feel Better  1st Wednesday of the month 10am-12 noon, Journey Room (Call [place] to register [phone])

## 2017-11-21 NOTE — Patient Instructions (Signed)
[place] at [facility] Discharge Instructions  RECOMMENDATIONS MADE BY THE CONSULTANT AND ANY TEST RESULTS WILL BE SENT TO YOUR REFERRING PHYSICIAN.  You were seen today by Dr. Twana First Follow up in 6 months with lab work You will continue to get your B-12 injection monthly We will get you set up to have your port removed    Thank you for choosing [place] at [facility] to provide your oncology and hematology care.  To afford each patient quality time with our provider, please arrive at least 15 minutes before your scheduled appointment time.    If you have a lab appointment with the [place] please come in thru the  Main Entrance and check in at the main information desk  You need to re-schedule your appointment should you arrive 10 or more minutes late.  We strive to give you quality time with our providers, and arriving late affects you and other patients whose appointments are after yours.  Also, if you no show three or more times for appointments you may be dismissed from the clinic at the providers discretion.     Again, thank you for choosing [facility].  Our hope is that these requests will decrease the amount of time that you wait before being seen by our physicians.       _____________________________________________________________  Should you have questions after your visit to [facility], please contact our office at [phone] between the hours of 8:30 a.m. and 4:30 p.m.  Voicemails left after 4:30 p.m. will not be returned until the following business day.  For prescription refill requests, have your pharmacy contact our office.       Resources For Cancer Patients and their Caregivers ? American Cancer Society: Can assist with transportation, wigs, general needs, runs Look Good Feel Better.        812-794-6893 ? Cancer Care: Provides financial assistance, online support groups,  medication/co-pay assistance.  1-800-813-HOPE 3610373282) ? [place] Assists [place] cancer patients and their families through emotional , educational and financial support.  (636)311-9303 ? [place] DSS Where to apply for food stamps, Medicaid and utility assistance. (906) 453-6544 ? RCATS: Transportation to medical appointments. (936) 124-3431 ? Social Security Administration: May apply for disability if have a Stage IV cancer. (571)283-5061 6155814248 ? [employer], Disability and Transit Services: Assists with nutrition, care and transit needs. [place] Support Programs: @10RELATIVEDAYS @ > Cancer Support Group  2nd Tuesday of the month 1pm-2pm, Journey Room  > Creative Journey  3rd Tuesday of the month 1130am-1pm, Journey Room  > Look Good Feel Better  1st Wednesday of the month 10am-12 noon, Journey Room (Call [place] to register [phone])

## 2017-11-21 NOTE — Progress Notes (Signed)
Dione Housekeeper, MD [address] [place]  [address]   CURRENT THERAPY: Surveillance  INTERVAL HISTORY: [name] 83 y.o. 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  He is S/P 1 cycle of R-mini-CHOP but issues with tolerance was appreciated.  His treatment was transitioned to palliative management with single-agent Rituxan.  This too was discontinued due to intolerance.  Despite bone marrow involvement, he is not a candidate for IT therapy given high likelihood of intolerance.  Complete metabolic response to therapy identified on PET imaging on 07/15/2016. AND Significant anemia, in the setting of RA, with a history of iron deficiency anemia, having required PRBCs in the past.    Diffuse large B-cell lymphoma of lymph nodes of multiple regions [facility])   08/22/2015 - 08/25/2015 Hospital Admission    Acute respiratory distress      08/22/2015 Imaging    CTA chest- Right infrahilar lymph node versus central lung mass measuring 2.8 x 4.1 cm. Worsening mediastinal adenopathy and bilateral hilar adenopathy as described with the largest node over the subcarinal region measuring 2.7 cm by short axis...      08/24/2015 Pathology Results    Diagnosis PLEURAL FLUID, RIGHT(SPECIMEN 1 OF 1 COLLECTED 08/24/15): ATYPICAL LYMPHOCYTES SUSPICIOUS FOR A LYMPHOPROLIFERATIVE PROCESS, SEE COMMENT. [name]      08/25/2015 Imaging    CT abd/pelvis- Mild upper retroperitoneal (gastrohepatic ligament and right retrocrural) lymphadenopathy. Cirrhosis.      09/03/2015 PET scan    Widespread metastatic adenopathy including the RIGHT cervical lymph nodes, LEFT axillary lymph nodes, mediastinal lymph, hilar lymph node, retroperitoneal lymph nodes and iliac lymph nodes and inguinal lymph nodes. Inguinal lymph nodes may be most....      09/11/2015 Pathology Results    Diagnosis Lymph node, biopsy, right inguinal - DIFFUSE LARGE B CELL LYMPHOMA.        09/11/2015 Pathology Results    Interpretation Tissue-Flow Cytometry - MONOCLONAL B-CELL POPULATION IDENTIFIED. Diagnosis Comment: The findings are consistent with non-Hodgkin B-cell lymphoma. (BNS:ecj 09/16/2015)      09/13/2015 - 09/18/2015 Hospital Admission    COPD exacerbation      09/18/2015 Bone Marrow Biopsy    Bone Marrow, Aspirate,Biopsy, and Clot, left iliac crest: Despite limited findings, the features are worrisome for minimal involvement by B cell lymphoproliferative process, particularly given the previous history of large B cell lymphoma.       09/21/2015 - 10/11/2015 Chemotherapy    Mini-R-CHOP      09/29/2015 - 10/02/2015 Hospital Admission    SOB, anemia, dehydration, recurrent pleural effusion (right) Direct admit from [facility].  S/p 5 units of PRBCs, therapeutic thoracentesis on right relieveing 1.5 L of fluid, adjustment of pain medication.      10/12/2015 Treatment Plan Change    Patient intolerant to R-mini-CHOP.  Trantition to palliative treatment with single-agent Rituxan.      10/12/2015 - 11/02/2015 Chemotherapy    Rituxan single-agent      10/18/2015 - 10/21/2015 Hospital Admission    1.   Acute encephalopathy 2.   New onset seizure disorder      10/19/2015 Imaging    EEG- This recording is abnormal for the following reasons: Single epileptiform discharge involving the right anterior temporal area. This can correlates clinically with focal seizures.      11/28/2015 - 12/03/2015 Hospital Admission    Hospital Admission for BRBPR      07/15/2016 PET scan  Complete interval metabolic response to therapy. No metabolically active lymphoma identified. Persistent moderate right pleural effusion, right lung atelectasis, and emphysema.      07/28/2016 Procedure    A total of approximately 1.3 L of RIGHT pleural fluid was removed.      08/02/2016 Pathology Results    PLEURAL FLUID, RIGHT (SPECIMEN 1 OF 1 COLLECTED 07/28/16): - REACTIVE MESOTHELIAL CELLS PRESENT  ADMIXED WITH LYMPHOCYTES. Tissue-Flow Cytometry - NO MONOCLONAL B CELL POPULATION IDENTIFIED. - T CELLS WITH NONSPECIFIC CHANGES.      Today patient presents with his daughter for continued follow up. He denies any drenching night sweats, unexplained weight loss, fevers or chills. He has not had any new health issues since his last visit. He has a new sore on his right ear. He denies any chest pain, shortness of breath, abdominal pain, focal weakness.   Review of Systems  Constitutional: Negative.  Negative for chills, fever and weight loss.  HENT: Negative.   Respiratory: Negative.  Negative for cough.   Cardiovascular: Negative.  Negative for chest pain.  Gastrointestinal: Negative.  Negative for blood in stool, constipation, diarrhea, melena, nausea and vomiting.  Genitourinary: Negative.   Musculoskeletal: Positive for joint pain (chronic).  Skin: Negative.   Neurological: Negative.  Negative for weakness.  Endo/Heme/Allergies: Negative.   Psychiatric/Behavioral: The patient does not have insomnia.     Past Medical History:  Diagnosis Date  . Anemia   . Aortic stenosis, moderate 05/21/2015  . Asthma   . B12 deficiency 07/28/2016  . Cellulitis of leg, left 01/03/2015  . Cervical spondylosis without myelopathy   . Cholelithiasis   . Chronic atrial fibrillation ([place])   . Chronic diastolic heart failure ([place])   . Chronic lower back pain   . Cirrhosis ([place])    Questionable, AFP normal Feb 01/2012, hx of ETOH use   . COPD (chronic obstructive pulmonary disease) (HCC)    Uses occasional nighttime O2  . Diffuse large B cell lymphoma ([place]) 08/22/2015  . Disseminated herpes zoster 2010  . DNR (do not resuscitate) 11/01/2015  . Dysrhythmia    chronic AFib  . Essential hypertension, benign   . GERD (gastroesophageal reflux disease)   . Headache(784.0)   . History of chicken pox 1941; 2011  . Iron deficiency anemia 01/18/2012  . Lupus anticoagulant positive   . Mixed hyperlipidemia     . Pulmonary embolus ([place])    2003 and 2011  . Pulmonary nodules    Chest CT 09/11  . Rectal ulcer 12/01/2015  . Rheumatoid arthritis(714.0)   . Seizure disorder ([place]) 09/2015  . Stage III chronic kidney disease   . Telangiectasia 11/29/2015   ANTRUM AND JEJUNUM    Past Surgical History:  Procedure Laterality Date  . BACK SURGERY    . CATARACT EXTRACTION W/ INTRAOCULAR LENS  IMPLANT, BILATERAL    . CHOLECYSTECTOMY  05/23/2012   Procedure: LAPAROSCOPIC CHOLECYSTECTOMY WITH INTRAOPERATIVE CHOLANGIOGRAM;  Surgeon: Stark Klein, MD;  Location: [place];  Service: General;  Laterality: N/A;  . COLONOSCOPY  01/24/2013   VQM:GQQPYPP polyps/colonic diverticulosis  . COLONOSCOPY N/A 12/01/2015   Procedure: COLONOSCOPY;  Surgeon: Danie Binder, MD;  [facility];  Service: Endoscopy;  Laterality: N/A;  . ESOPHAGOGASTRODUODENOSCOPY  02/02/12   JKD:TOIZTIWP size hiatal hernia; otherwise normal exam  . ESOPHAGOGASTRODUODENOSCOPY N/A 11/29/2015   YKD:XIPJASNK diverticulosis/small internal hemorrhoids/left colon is redundant  . LYMPH NODE BIOPSY    . LYMPH NODE BIOPSY Right 09/11/2015   Procedure: RIGHT INGUINAL  LYMPH NODE BIOPSY;  Surgeon: Aviva Signs, MD;  Location: [facility];  Service: General;  Laterality: Right;  . MYRINGOTOMY     "3 times; both ears"  . POLYPECTOMY  02/02/2012   RMR: Multiple colonic polyps removed, flat, tubular adenomas/ Left-sided diverticulosis  . PORTACATH PLACEMENT    . PORTACATH PLACEMENT Left 09/11/2015   Procedure: INSERTION PORT-A-CATH;  Surgeon: Aviva Signs, MD;  Location: [facility];  Service: General;  Laterality: Left;  . POSTERIOR LUMBAR VERTEBRAE EXCISION  2003; 2009; 2011    Family History  Problem Relation Age of Onset  . Diabetes Mother   . Colon cancer Neg Hx   . Liver disease Neg Hx     Social History   Socioeconomic History  . Marital status: Widowed    Spouse name: Not on file  . Number of children: 6  . Years of education: 9th  . Highest  education level: Not on file  Social Needs  . Financial resource strain: Not on file  . Food insecurity - worry: Not on file  . Food insecurity - inability: Not on file  . Transportation needs - medical: Not on file  . Transportation needs - non-medical: Not on file  Occupational History  . Occupation: retired    Fish farm manager: RETIRED  . Occupation: RETIRED    Employer: RETIRED  Tobacco Use  . Smoking status: Former Smoker    Packs/day: 1.00    Years: 57.00    Pack years: 57.00    Types: Cigarettes    Last attempt to quit: 01/27/1998    Years since quitting: 19.8  . Smokeless tobacco: Never Used  Substance and Sexual Activity  . Alcohol use: No    Alcohol/week: 0.0 oz    Comment: "quit drinking 1976"  . Drug use: No  . Sexual activity: Not Currently  Other Topics Concern  . Not on file  Social History Narrative  . Not on file     PHYSICAL EXAMINATION  ECOG PERFORMANCE STATUS: 3 - Symptomatic, >50% confined to bed  Vitals:   11/21/17 1044  BP: (!) 102/56  Pulse: 100  Resp: 20  Temp: 97.6 F (36.4 C)  SpO2: 94%    GENERAL:alert, no distress, cooperative and accompanied by daughter, in wheelchair. SKIN: skin color, texture, turgor are normal. HEAD: Normocephalic EYES: normal, EOMI EARS: Ulcerated sore on right ear. OROPHARYNX:mucous membranes are moist  NECK: supple, trachea midline LYMPH:  No lymphadenopathy noted in cervical, axillary, or clavicular areas LUNGS: CTA bilaterally HEART: irregularly irregular without murmur, rub, or gallop. ABDOMEN:abdomen soft without tenderness to palpation. BACK: Back symmetric, no curvature. EXTREMITIES:less then 2 second capillary refill, effusion, or inflammation, no skin discoloration.   NEURO: alert & oriented x 3 with fluent speech, in wheelchair   LABORATORY DATA: CBC    Component Value Date/Time   WBC 8.6 07/13/2017 1426   RBC 4.25 07/13/2017 1426   HGB 13.8 07/13/2017 1426   HCT 42.4 07/13/2017 1426   HCT 34  01/11/2012 1007   PLT 125 (L) 07/13/2017 1426   MCV 99.8 07/13/2017 1426   MCV 92.0 01/11/2012 1007   MCH 32.5 07/13/2017 1426   MCHC 32.5 07/13/2017 1426   RDW 14.2 07/13/2017 1426   LYMPHSABS 1.4 07/13/2017 1426   MONOABS 0.6 07/13/2017 1426   EOSABS 0.3 07/13/2017 1426   BASOSABS 0.0 07/13/2017 1426      Chemistry      Component Value Date/Time   NA 140 07/13/2017 1426   NA 140 01/11/2012  1005   K 3.7 07/13/2017 1426   K 4.5 01/11/2012 1005   CL 102 07/13/2017 1426   CO2 30 07/13/2017 1426   BUN 14 07/13/2017 1426   BUN 12 01/11/2012 1005   CREATININE 1.21 07/13/2017 1426   CREATININE 1.17 01/11/2012 1005      Component Value Date/Time   CALCIUM 9.2 07/13/2017 1426   CALCIUM 9.2 01/11/2012 1005   ALKPHOS 62 07/13/2017 1426   AST 21 07/13/2017 1426   ALT 13 (L) 07/13/2017 1426   BILITOT 0.7 07/13/2017 1426      Lab Results  Component Value Date   IRON 10 (L) 11/09/2016   TIBC 290 11/09/2016   FERRITIN 236 11/09/2016     PENDING LABS:   RADIOGRAPHIC STUDIES:  No results found.   PATHOLOGY:    ASSESSMENT AND PLAN:  1.Stage IV Diffuse large B cell lymphoma with bone marrow involvement.  Clinically NED. Continue surveillance.  Patient is a poor candidate for future systemic therapy if he should recur given his overall poor performance status and weakened state. Continue surveillance per NCCN guidelines with H and P and labs every 3-6 months for the first 5 years and then annually thereafter. CT scans only as clinically indicated. Plan to have his chemoport removed; order placed.   2. Vitamin D 12 deficiency Cancel monthly vitamin B-12 injections. Last level in July was >7500. Patient has also been taking oral B12 as well, therefore we will continue on oral B12 alone. We'll repeat his by B-12 level on his next visit.  RTC in 6 months for follow up with labs.  Orders Placed This Encounter  Procedures  . CBC with Differential    Standing Status:    Future    Standing Expiration Date:   11/21/2018  . Comprehensive metabolic panel    Standing Status:   Future    Standing Expiration Date:   11/21/2018  . Lactate dehydrogenase    Standing Status:   Future    Standing Expiration Date:   11/21/2018  . Vitamin B12    Standing Status:   Future    Standing Expiration Date:   11/21/2018  . CBC with Differential    Standing Status:   Future    Number of Occurrences:   1    Standing Expiration Date:   11/21/2018  . Comprehensive metabolic panel    Standing Status:   Future    Number of Occurrences:   1    Standing Expiration Date:   11/21/2018  . Vitamin B12    Standing Status:   Future    Number of Occurrences:   1    Standing Expiration Date:   11/21/2018  . Lactate dehydrogenase    Standing Status:   Future    Number of Occurrences:   1    Standing Expiration Date:   11/21/2018     All questions were answered. The patient knows to call the clinic with any problems, questions or concerns. We can certainly see the patient much sooner if necessary.   This note is electronically signed by: Twana First, MD 11/21/2017 10:41 AM

## 2017-11-29 ENCOUNTER — Other Ambulatory Visit (HOSPITAL_COMMUNITY): Payer: Self-pay | Admitting: Adult Health

## 2017-11-29 DIAGNOSIS — E538 Deficiency of other specified B group vitamins: Secondary | ICD-10-CM

## 2017-12-12 ENCOUNTER — Ambulatory Visit: Payer: Medicare Other | Admitting: General Surgery

## 2017-12-21 ENCOUNTER — Ambulatory Visit (HOSPITAL_COMMUNITY): Payer: Medicare Other

## 2018-01-01 ENCOUNTER — Telehealth (HOSPITAL_COMMUNITY): Payer: Self-pay | Admitting: *Deleted

## 2018-01-01 ENCOUNTER — Other Ambulatory Visit (HOSPITAL_COMMUNITY): Payer: Self-pay | Admitting: *Deleted

## 2018-01-01 DIAGNOSIS — M059 Rheumatoid arthritis with rheumatoid factor, unspecified: Secondary | ICD-10-CM

## 2018-01-01 DIAGNOSIS — M47812 Spondylosis without myelopathy or radiculopathy, cervical region: Secondary | ICD-10-CM

## 2018-01-02 ENCOUNTER — Other Ambulatory Visit (HOSPITAL_COMMUNITY): Payer: Self-pay | Admitting: Adult Health

## 2018-01-02 ENCOUNTER — Ambulatory Visit: Payer: Medicare Other | Admitting: General Surgery

## 2018-01-02 ENCOUNTER — Encounter (HOSPITAL_COMMUNITY): Payer: Self-pay | Admitting: Adult Health

## 2018-01-02 DIAGNOSIS — M47812 Spondylosis without myelopathy or radiculopathy, cervical region: Secondary | ICD-10-CM

## 2018-01-02 DIAGNOSIS — M059 Rheumatoid arthritis with rheumatoid factor, unspecified: Secondary | ICD-10-CM

## 2018-01-02 MED ORDER — OXYCODONE HCL 10 MG PO TABS: 10.0000 mg | ORAL_TABLET | Freq: Four times a day (QID) | ORAL | 0 refills | Status: DC | PRN

## 2018-01-02 NOTE — Progress Notes (Signed)
Patient called cancer center requesting refill of Oxycodone.   [place] Controlled Substance Reporting System reviewed and refill is appropriate on or after 01/02/18. Paper prescription printed & post-dated; Rx left at cancer center front desk for patient to retrieve after showing photo ID per clinic policy.   NCCSRS reviewed:     Mike Craze, NP [place] [phone]

## 2018-01-02 NOTE — Telephone Encounter (Signed)
Rx printed.   gwd 

## 2018-01-19 ENCOUNTER — Inpatient Hospital Stay (HOSPITAL_COMMUNITY): Payer: Medicare Other | Attending: Internal Medicine

## 2018-01-25 ENCOUNTER — Ambulatory Visit (INDEPENDENT_AMBULATORY_CARE_PROVIDER_SITE_OTHER): Payer: Medicare Other | Admitting: General Surgery

## 2018-01-25 ENCOUNTER — Encounter: Payer: Self-pay | Admitting: General Surgery

## 2018-01-25 VITALS — BP 117/63 | HR 95 | Temp 98.0°F | Ht 68.0 in | Wt 213.0 lb

## 2018-01-25 DIAGNOSIS — C8338 Diffuse large B-cell lymphoma, lymph nodes of multiple sites: Secondary | ICD-10-CM | POA: Diagnosis not present

## 2018-01-25 NOTE — H&P (Signed)
[name]; [id number]; 1931/10/12   HPI Patient is an 84 year old white male who has been referred back to my care by Dr. Talbert Cage for removal of a Port-A-Cath.  He has a large B-cell lymphoma and will not be completing his chemotherapy.  He currently has no pain at the Port-A-Cath site.  He states he is on Coumadin. Past Medical History:  Diagnosis Date  . Anemia   . Aortic stenosis, moderate 05/21/2015  . Asthma   . B12 deficiency 07/28/2016  . Cellulitis of leg, left 01/03/2015  . Cervical spondylosis without myelopathy   . Cholelithiasis   . Chronic atrial fibrillation ([place])   . Chronic diastolic heart failure ([place])   . Chronic lower back pain   . Cirrhosis ([place])    Questionable, AFP normal Feb 01/2012, hx of ETOH use   . COPD (chronic obstructive pulmonary disease) (HCC)    Uses occasional nighttime O2  . Diffuse large B cell lymphoma ([place]) 08/22/2015  . Disseminated herpes zoster 2010  . DNR (do not resuscitate) 11/01/2015  . Dysrhythmia    chronic AFib  . Essential hypertension, benign   . GERD (gastroesophageal reflux disease)   . Headache(784.0)   . History of chicken pox 1941; 2011  . Iron deficiency anemia 01/18/2012  . Lupus anticoagulant positive   . Mixed hyperlipidemia   . Pulmonary embolus ([place])    2003 and 2011  . Pulmonary nodules    Chest CT 09/11  . Rectal ulcer 12/01/2015  . Rheumatoid arthritis(714.0)   . Seizure disorder ([place]) 09/2015  . Stage III chronic kidney disease ([place])   . Telangiectasia 11/29/2015   ANTRUM AND JEJUNUM    Past Surgical History:  Procedure Laterality Date  . BACK SURGERY    . CATARACT EXTRACTION W/ INTRAOCULAR LENS  IMPLANT, BILATERAL    . CHOLECYSTECTOMY  05/23/2012   Procedure: LAPAROSCOPIC CHOLECYSTECTOMY WITH INTRAOPERATIVE CHOLANGIOGRAM;  Surgeon: Stark Klein, MD;  Location: [place];  Service: General;  Laterality: N/A;  . COLONOSCOPY  01/24/2013   JSE:GBTDVVO polyps/colonic diverticulosis  . COLONOSCOPY N/A 12/01/2015    Procedure: COLONOSCOPY;  Surgeon: Danie Binder, MD;  [facility];  Service: Endoscopy;  Laterality: N/A;  . ESOPHAGOGASTRODUODENOSCOPY  02/02/12   HYW:[id number] size hiatal hernia; otherwise normal exam  . ESOPHAGOGASTRODUODENOSCOPY N/A 11/29/2015   RSW:NIOEVOJJ diverticulosis/small internal hemorrhoids/left colon is redundant  . LYMPH NODE BIOPSY    . LYMPH NODE BIOPSY Right 09/11/2015   Procedure: RIGHT INGUINAL LYMPH NODE BIOPSY;  Surgeon: Aviva Signs, MD;  Location[facility];  Service: General;  Laterality: Right;  . MYRINGOTOMY     "3 times; both ears"  . POLYPECTOMY  02/02/2012   RMR: Multiple colonic polyps removed, flat, tubular adenomas/ Left-sided diverticulosis  . PORTACATH PLACEMENT    . PORTACATH PLACEMENT Left 09/11/2015   Procedure: INSERTION PORT-A-CATH;  Surgeon: Aviva Signs, MD;  Location[facility];  Service: General;  Laterality: Left;  . POSTERIOR LUMBAR VERTEBRAE EXCISION  2003; 2009; 2011    Family History  Problem Relation Age of Onset  . Diabetes Mother   . Colon cancer Neg Hx   . Liver disease Neg Hx     Current Outpatient Medications on File Prior to Visit  Medication Sig Dispense Refill  . albuterol (PROAIR HFA) 108 (90 BASE) MCG/ACT inhaler Inhale 2 puffs into the lungs every 6 (six) hours as needed. Shortness of Breath 1 Inhaler 3  . allopurinol (ZYLOPRIM) 300 MG tablet Take 300 mg by mouth daily.  3  . DIGOX 125 MCG tablet TAKE ONE TABLET BY MOUTH DAILY 15 tablet 0  . diltiazem (CARDIZEM CD) 240 MG 24 hr capsule Take 1 capsule (240 mg total) by mouth daily.    . ferrous sulfate 325 (65 FE) MG tablet Take 325 mg by mouth daily.    . fluticasone (FLONASE) 50 MCG/ACT nasal spray Place 2 sprays into both nostrils daily.   0  . folic acid (FOLVITE) 1 MG tablet TAKE ONE TABLET BY MOUTH DAILY. 30 tablet 2  . furosemide (LASIX) 40 MG tablet Take 1 tablet (40 mg total) by mouth 2 (two) times daily. Take daily for prevention of fluid buildup. 30 tablet   .  gabapentin (NEURONTIN) 300 MG capsule Take 1 capsule (300 mg total) by mouth 2 (two) times daily.    Marland Kitchen ipratropium-albuterol (DUONEB) 0.5-2.5 (3) MG/3ML SOLN Take 3 mLs by nebulization every 6 (six) hours as needed. (Patient taking differently: Take 3 mLs by nebulization every 6 (six) hours as needed (shortness of breath). ) 360 mL 1  . levalbuterol (XOPENEX) 0.63 MG/3ML nebulizer solution Take 3 mLs (0.63 mg total) by nebulization every 6 (six) hours as needed for wheezing or shortness of breath. 3 mL 0  . levETIRAcetam (KEPPRA) 500 MG tablet Take 1 tablet (500 mg total) by mouth 2 (two) times daily. 60 tablet 0  . lidocaine-prilocaine (EMLA) cream Apply a quarter size amount to port site 1 hour prior to chemo. Do not rub in. Cover with plastic wrap. 30 g 3  . methocarbamol (ROBAXIN) 500 MG tablet Take 1 tablet (500 mg total) by mouth every 8 (eight) hours as needed for muscle spasms. 30 tablet 0  . Omega-3 Fatty Acids (FISH OIL) 1000 MG CAPS Take 2 capsules by mouth 2 (two) times daily.     Marland Kitchen omeprazole (PRILOSEC) 40 MG capsule Take 1 capsule (40 mg total) by mouth 2 (two) times daily. 60 capsule 3  . Oxycodone HCl 10 MG TABS Take 1 tablet (10 mg total) by mouth every 6 (six) hours as needed. 120 tablet 0  . potassium chloride SA (K-DUR,KLOR-CON) 20 MEQ tablet Take 1 tablet (20 mEq total) by mouth daily. Starting 08/27/15.    Marland Kitchen predniSONE (DELTASONE) 20 MG tablet Take 1 tablet by mouth daily.    . QUEtiapine (SEROQUEL) 25 MG tablet Take 1 tablet (25 mg total) by mouth 2 (two) times daily. (Patient taking differently: Take 25 mg by mouth at bedtime. )    . tadalafil (CIALIS) 5 MG tablet Take 5 mg by mouth daily as needed for erectile dysfunction.     . tamsulosin (FLOMAX) 0.4 MG CAPS capsule Take 1 capsule (0.4 mg total) by mouth at bedtime. For prostate treatment. 30 capsule 3  . Vitamin D, Ergocalciferol, (DRISDOL) 50000 UNITS CAPS capsule Take 1 capsule by mouth once a week. Reported on 07/05/2016  4    Current Facility-Administered Medications on File Prior to Visit  Medication Dose Route Frequency Provider Last Rate Last Dose  . heparin lock flush 100 unit/mL  500 Units Intravenous Once Kefalas, Thomas S, PA-C      . sodium chloride flush (NS) 0.9 % injection 10 mL  10 mL Intravenous PRN Baird Cancer, PA-C        Allergies  Allergen Reactions  . Cymbalta [Duloxetine Hcl] Other (See Comments)    Confusion   . Procaine Hcl Hives and Other (See Comments)    NOVOCAINE: Sweating, Confusion, Not in right state of mind.  Marland Kitchen  Bystolic [Nebivolol Hcl] Other (See Comments)    unknown unknown  . Nebivolol Hcl Other (See Comments)    unknown    Social History   Substance and Sexual Activity  Alcohol Use No  . Alcohol/week: 0.0 oz   Comment: "quit drinking 1976"    Social History   Tobacco Use  Smoking Status Former Smoker  . Packs/day: 1.00  . Years: 57.00  . Pack years: 57.00  . Types: Cigarettes  . Last attempt to quit: 01/27/1998  . Years since quitting: 20.0  Smokeless Tobacco Never Used    Review of Systems  Constitutional: Positive for malaise/fatigue.  HENT: Negative.   Eyes: Negative.   Respiratory: Positive for shortness of breath and wheezing.   Cardiovascular: Negative.   Gastrointestinal: Positive for heartburn.  Genitourinary: Positive for urgency.  Musculoskeletal: Positive for back pain and joint pain.  Skin: Negative.   Neurological: Positive for headaches.  Endo/Heme/Allergies: Bruises/bleeds easily.  Psychiatric/Behavioral: Negative.     Objective   Vitals:   01/25/18 1033  BP: 117/63  Pulse: 95  Temp: 98 F (36.7 C)    Physical Exam  Constitutional: He is oriented to person, place, and time and well-developed, well-nourished, and in no distress.  HENT:  Head: Normocephalic and atraumatic.  Cardiovascular: Normal heart sounds. Exam reveals no gallop and no friction rub.  No murmur heard. Irregularly irregular rhythm   Pulmonary/Chest: Effort normal. No respiratory distress. He has wheezes. He has no rales.  Port-A-Cath in place left upper chest.  Neurological: He is alert and oriented to person, place, and time.  Skin: Skin is warm and dry.  Vitals reviewed.   Assessment  Large B-cell lymphoma, no need for further chemotherapy Plan   Patient is scheduled for Port-A-Cath removal in the minor procedure room on 01/31/2018.  The risks and benefits of the procedure including bleeding and infection were fully explained to the patient, who gave informed consent.  He is to stop his Coumadin 4 days prior to the procedure.

## 2018-01-25 NOTE — Progress Notes (Signed)
[name]; [id number]; 09-Jul-1931   HPI Patient is an 84 year old white male who has been referred back to my care by Dr. Talbert Cage for removal of a Port-A-Cath.  He has a large B-cell lymphoma and will not be completing his chemotherapy.  He currently has no pain at the Port-A-Cath site.  He states he is on Coumadin. Past Medical History:  Diagnosis Date  . Anemia   . Aortic stenosis, moderate 05/21/2015  . Asthma   . B12 deficiency 07/28/2016  . Cellulitis of leg, left 01/03/2015  . Cervical spondylosis without myelopathy   . Cholelithiasis   . Chronic atrial fibrillation ([place])   . Chronic diastolic heart failure ([place])   . Chronic lower back pain   . Cirrhosis ([place])    Questionable, AFP normal Feb 01/2012, hx of ETOH use   . COPD (chronic obstructive pulmonary disease) (HCC)    Uses occasional nighttime O2  . Diffuse large B cell lymphoma ([place]) 08/22/2015  . Disseminated herpes zoster 2010  . DNR (do not resuscitate) 11/01/2015  . Dysrhythmia    chronic AFib  . Essential hypertension, benign   . GERD (gastroesophageal reflux disease)   . Headache(784.0)   . History of chicken pox 1941; 2011  . Iron deficiency anemia 01/18/2012  . Lupus anticoagulant positive   . Mixed hyperlipidemia   . Pulmonary embolus ([place])    2003 and 2011  . Pulmonary nodules    Chest CT 09/11  . Rectal ulcer 12/01/2015  . Rheumatoid arthritis(714.0)   . Seizure disorder ([place]) 09/2015  . Stage III chronic kidney disease ([place])   . Telangiectasia 11/29/2015   ANTRUM AND JEJUNUM    Past Surgical History:  Procedure Laterality Date  . BACK SURGERY    . CATARACT EXTRACTION W/ INTRAOCULAR LENS  IMPLANT, BILATERAL    . CHOLECYSTECTOMY  05/23/2012   Procedure: LAPAROSCOPIC CHOLECYSTECTOMY WITH INTRAOPERATIVE CHOLANGIOGRAM;  Surgeon: Stark Klein, MD;  Location: [facility] [facility];  Service: General;  Laterality: N/A;  . COLONOSCOPY  01/24/2013   RXV:QMGQQPY polyps/colonic diverticulosis  . COLONOSCOPY N/A 12/01/2015    Procedure: COLONOSCOPY;  Surgeon: Danie Binder, MD;  [facility];  Service: Endoscopy;  Laterality: N/A;  . ESOPHAGOGASTRODUODENOSCOPY  02/02/12   PPJ:KDTOIZTI size hiatal hernia; otherwise normal exam  . ESOPHAGOGASTRODUODENOSCOPY N/A 11/29/2015   WPY:KDXIPJAS diverticulosis/small internal hemorrhoids/left colon is redundant  . LYMPH NODE BIOPSY    . LYMPH NODE BIOPSY Right 09/11/2015   Procedure: RIGHT INGUINAL LYMPH NODE BIOPSY;  Surgeon: Aviva Signs, MD;  Location: [facility];  Service: General;  Laterality: Right;  . MYRINGOTOMY     "3 times; both ears"  . POLYPECTOMY  02/02/2012   RMR: Multiple colonic polyps removed, flat, tubular adenomas/ Left-sided diverticulosis  . PORTACATH PLACEMENT    . PORTACATH PLACEMENT Left 09/11/2015   Procedure: INSERTION PORT-A-CATH;  Surgeon: Aviva Signs, MD;  Location: [facility];  Service: General;  Laterality: Left;  . POSTERIOR LUMBAR VERTEBRAE EXCISION  2003; 2009; 2011    Family History  Problem Relation Age of Onset  . Diabetes Mother   . Colon cancer Neg Hx   . Liver disease Neg Hx     Current Outpatient Medications on File Prior to Visit  Medication Sig Dispense Refill  . albuterol (PROAIR HFA) 108 (90 BASE) MCG/ACT inhaler Inhale 2 puffs into the lungs every 6 (six) hours as needed. Shortness of Breath 1 Inhaler 3  . allopurinol (ZYLOPRIM) 300 MG tablet Take 300 mg by mouth daily.  3  . DIGOX 125 MCG tablet TAKE ONE TABLET BY MOUTH DAILY 15 tablet 0  . diltiazem (CARDIZEM CD) 240 MG 24 hr capsule Take 1 capsule (240 mg total) by mouth daily.    . ferrous sulfate 325 (65 FE) MG tablet Take 325 mg by mouth daily.    . fluticasone (FLONASE) 50 MCG/ACT nasal spray Place 2 sprays into both nostrils daily.   0  . folic acid (FOLVITE) 1 MG tablet TAKE ONE TABLET BY MOUTH DAILY. 30 tablet 2  . furosemide (LASIX) 40 MG tablet Take 1 tablet (40 mg total) by mouth 2 (two) times daily. Take daily for prevention of fluid buildup. 30 tablet   .  gabapentin (NEURONTIN) 300 MG capsule Take 1 capsule (300 mg total) by mouth 2 (two) times daily.    Marland Kitchen ipratropium-albuterol (DUONEB) 0.5-2.5 (3) MG/3ML SOLN Take 3 mLs by nebulization every 6 (six) hours as needed. (Patient taking differently: Take 3 mLs by nebulization every 6 (six) hours as needed (shortness of breath). ) 360 mL 1  . levalbuterol (XOPENEX) 0.63 MG/3ML nebulizer solution Take 3 mLs (0.63 mg total) by nebulization every 6 (six) hours as needed for wheezing or shortness of breath. 3 mL 0  . levETIRAcetam (KEPPRA) 500 MG tablet Take 1 tablet (500 mg total) by mouth 2 (two) times daily. 60 tablet 0  . lidocaine-prilocaine (EMLA) cream Apply a quarter size amount to port site 1 hour prior to chemo. Do not rub in. Cover with plastic wrap. 30 g 3  . methocarbamol (ROBAXIN) 500 MG tablet Take 1 tablet (500 mg total) by mouth every 8 (eight) hours as needed for muscle spasms. 30 tablet 0  . Omega-3 Fatty Acids (FISH OIL) 1000 MG CAPS Take 2 capsules by mouth 2 (two) times daily.     Marland Kitchen omeprazole (PRILOSEC) 40 MG capsule Take 1 capsule (40 mg total) by mouth 2 (two) times daily. 60 capsule 3  . Oxycodone HCl 10 MG TABS Take 1 tablet (10 mg total) by mouth every 6 (six) hours as needed. 120 tablet 0  . potassium chloride SA (K-DUR,KLOR-CON) 20 MEQ tablet Take 1 tablet (20 mEq total) by mouth daily. Starting 08/27/15.    Marland Kitchen predniSONE (DELTASONE) 20 MG tablet Take 1 tablet by mouth daily.    . QUEtiapine (SEROQUEL) 25 MG tablet Take 1 tablet (25 mg total) by mouth 2 (two) times daily. (Patient taking differently: Take 25 mg by mouth at bedtime. )    . tadalafil (CIALIS) 5 MG tablet Take 5 mg by mouth daily as needed for erectile dysfunction.     . tamsulosin (FLOMAX) 0.4 MG CAPS capsule Take 1 capsule (0.4 mg total) by mouth at bedtime. For prostate treatment. 30 capsule 3  . Vitamin D, Ergocalciferol, (DRISDOL) 50000 UNITS CAPS capsule Take 1 capsule by mouth once a week. Reported on 07/05/2016  4    Current Facility-Administered Medications on File Prior to Visit  Medication Dose Route Frequency Provider Last Rate Last Dose  . heparin lock flush 100 unit/mL  500 Units Intravenous Once Kefalas, Thomas S, PA-C      . sodium chloride flush (NS) 0.9 % injection 10 mL  10 mL Intravenous PRN Baird Cancer, PA-C        Allergies  Allergen Reactions  . Cymbalta [Duloxetine Hcl] Other (See Comments)    Confusion   . Procaine Hcl Hives and Other (See Comments)    NOVOCAINE: Sweating, Confusion, Not in right state of mind.  Marland Kitchen  Bystolic [Nebivolol Hcl] Other (See Comments)    unknown unknown  . Nebivolol Hcl Other (See Comments)    unknown    Social History   Substance and Sexual Activity  Alcohol Use No  . Alcohol/week: 0.0 oz   Comment: "quit drinking 1976"    Social History   Tobacco Use  Smoking Status Former Smoker  . Packs/day: 1.00  . Years: 57.00  . Pack years: 57.00  . Types: Cigarettes  . Last attempt to quit: 01/27/1998  . Years since quitting: 20.0  Smokeless Tobacco Never Used    Review of Systems  Constitutional: Positive for malaise/fatigue.  HENT: Negative.   Eyes: Negative.   Respiratory: Positive for shortness of breath and wheezing.   Cardiovascular: Negative.   Gastrointestinal: Positive for heartburn.  Genitourinary: Positive for urgency.  Musculoskeletal: Positive for back pain and joint pain.  Skin: Negative.   Neurological: Positive for headaches.  Endo/Heme/Allergies: Bruises/bleeds easily.  Psychiatric/Behavioral: Negative.     Objective   Vitals:   01/25/18 1033  BP: 117/63  Pulse: 95  Temp: 98 F (36.7 C)    Physical Exam  Constitutional: He is oriented to person, place, and time and well-developed, well-nourished, and in no distress.  HENT:  Head: Normocephalic and atraumatic.  Cardiovascular: Normal heart sounds. Exam reveals no gallop and no friction rub.  No murmur heard. Irregularly irregular rhythm   Pulmonary/Chest: Effort normal. No respiratory distress. He has wheezes. He has no rales.  Port-A-Cath in place left upper chest.  Neurological: He is alert and oriented to person, place, and time.  Skin: Skin is warm and dry.  Vitals reviewed.   Assessment  Large B-cell lymphoma, no need for further chemotherapy Plan   Patient is scheduled for Port-A-Cath removal in the minor procedure room on 01/31/2018.  The risks and benefits of the procedure including bleeding and infection were fully explained to the patient, who gave informed consent.  He is to stop his Coumadin 4 days prior to the procedure.

## 2018-01-25 NOTE — Patient Instructions (Signed)
Stop Coumadin four days prior to procedure  Implanted Port Removal Implanted port removal is a procedure to remove the port and catheter (port-a-cath) that is implanted under your skin. The port is a small disc under your skin that can be punctured with a needle. It is connected to a vein in your chest or neck by a small flexible tube (catheter). The port-a-cath is used for treatment through an IV tube and for taking blood samples. Your health care provider will remove the port-a-cath if:  You no longer need it for treatment.  It is not working properly.  The area around it gets infected.  Tell a health care provider about:  Any allergies you have.  All medicines you are taking, including vitamins, herbs, eye drops, creams, and over-the-counter medicines.  Any problems you or family members have had with anesthetic medicines.  Any blood disorders you have.  Any surgeries you have had.  Any medical conditions you have.  Whether you are pregnant or may be pregnant. What are the risks? Generally, this is a safe procedure. However, problems may occur, including:  Infection.  Bleeding.  Allergic reactions to anesthetic medicines.  Damage to nerves or blood vessels.  What happens before the procedure?  You will have: ? A physical exam. ? Blood tests. ? Imaging tests, including a chest X-ray.  Follow instructions from your health care provider about eating or drinking restrictions.  Ask your health care provider about: ? Changing or stopping your regular medicines. This is especially important if you are taking diabetes medicines or blood thinners. ? Taking medicines such as aspirin and ibuprofen. These medicines can thin your blood. Do not take these medicines before your procedure if your surgeon instructs you not to.  Ask your health care provider how your surgical site will be marked or identified.  You may be given antibiotic medicine to help prevent  infection.  Plan to have someone take you home after the procedure.  If you will be going home right after the procedure, plan to have someone stay with you for 24 hours. What happens during the procedure?  To reduce your risk of infection: ? Your health care team will wash or sanitize their hands. ? Your skin will be washed with soap.  You may be given one or more of the following: ? A medicine to help you relax (sedative). ? A medicine to numb the area (local anesthetic).  A small cut (incision) will be made at the site of your port-a-cath.  The port-a-cath and the catheter that has been inside your vein will gently be removed.  The incision will be closed with stitches (sutures), adhesive strips, or skin glue.  A bandage (dressing) will be placed over the incision. The procedure may vary among health care providers and hospitals. What happens after the procedure?  Your blood pressure, heart rate, breathing rate, and blood oxygen level will be monitored often until the medicines you were given have worn off.  Do not drive for 24 hours if you received a sedative. This information is not intended to replace advice given to you by your health care provider. Make sure you discuss any questions you have with your health care provider. Document Released: 11/23/2015 Document Revised: 05/19/2016 Document Reviewed: 09/16/2015 Elsevier Interactive Patient Education  [employer].

## 2018-01-30 ENCOUNTER — Other Ambulatory Visit (HOSPITAL_COMMUNITY): Payer: Self-pay | Admitting: Adult Health

## 2018-01-30 DIAGNOSIS — E538 Deficiency of other specified B group vitamins: Secondary | ICD-10-CM

## 2018-01-31 ENCOUNTER — Encounter (HOSPITAL_COMMUNITY)
Admission: RE | Disposition: A | Discharge status: Home / Self Care | Payer: Self-pay | Source: Ambulatory Visit | Attending: General Surgery

## 2018-01-31 ENCOUNTER — Encounter (HOSPITAL_COMMUNITY): Payer: Self-pay | Admitting: *Deleted

## 2018-01-31 ENCOUNTER — Ambulatory Visit (HOSPITAL_COMMUNITY)
Admission: RE | Admit: 2018-01-31 | Discharge: 2018-01-31 | Disposition: A | Discharge status: Home / Self Care | Payer: Medicare Other | Source: Ambulatory Visit | Attending: General Surgery | Admitting: General Surgery

## 2018-01-31 DIAGNOSIS — C8330 Diffuse large B-cell lymphoma, unspecified site: Secondary | ICD-10-CM | POA: Diagnosis not present

## 2018-01-31 DIAGNOSIS — Z7952 Long term (current) use of systemic steroids: Secondary | ICD-10-CM | POA: Insufficient documentation

## 2018-01-31 DIAGNOSIS — I482 Chronic atrial fibrillation: Secondary | ICD-10-CM | POA: Insufficient documentation

## 2018-01-31 DIAGNOSIS — C8338 Diffuse large B-cell lymphoma, lymph nodes of multiple sites: Secondary | ICD-10-CM

## 2018-01-31 DIAGNOSIS — Z452 Encounter for adjustment and management of vascular access device: Secondary | ICD-10-CM | POA: Insufficient documentation

## 2018-01-31 DIAGNOSIS — I130 Hypertensive heart and chronic kidney disease with heart failure and stage 1 through stage 4 chronic kidney disease, or unspecified chronic kidney disease: Secondary | ICD-10-CM | POA: Diagnosis not present

## 2018-01-31 DIAGNOSIS — Z9221 Personal history of antineoplastic chemotherapy: Secondary | ICD-10-CM | POA: Insufficient documentation

## 2018-01-31 DIAGNOSIS — Z79899 Other long term (current) drug therapy: Secondary | ICD-10-CM | POA: Diagnosis not present

## 2018-01-31 DIAGNOSIS — D649 Anemia, unspecified: Secondary | ICD-10-CM | POA: Diagnosis not present

## 2018-01-31 DIAGNOSIS — Z66 Do not resuscitate: Secondary | ICD-10-CM | POA: Diagnosis not present

## 2018-01-31 DIAGNOSIS — G40909 Epilepsy, unspecified, not intractable, without status epilepticus: Secondary | ICD-10-CM | POA: Insufficient documentation

## 2018-01-31 DIAGNOSIS — M069 Rheumatoid arthritis, unspecified: Secondary | ICD-10-CM | POA: Diagnosis not present

## 2018-01-31 DIAGNOSIS — N183 Chronic kidney disease, stage 3 (moderate): Secondary | ICD-10-CM | POA: Diagnosis not present

## 2018-01-31 DIAGNOSIS — Z7951 Long term (current) use of inhaled steroids: Secondary | ICD-10-CM | POA: Insufficient documentation

## 2018-01-31 DIAGNOSIS — Z86711 Personal history of pulmonary embolism: Secondary | ICD-10-CM | POA: Diagnosis not present

## 2018-01-31 DIAGNOSIS — I5032 Chronic diastolic (congestive) heart failure: Secondary | ICD-10-CM | POA: Insufficient documentation

## 2018-01-31 DIAGNOSIS — Z87891 Personal history of nicotine dependence: Secondary | ICD-10-CM | POA: Insufficient documentation

## 2018-01-31 DIAGNOSIS — J449 Chronic obstructive pulmonary disease, unspecified: Secondary | ICD-10-CM | POA: Insufficient documentation

## 2018-01-31 DIAGNOSIS — Z7901 Long term (current) use of anticoagulants: Secondary | ICD-10-CM | POA: Insufficient documentation

## 2018-01-31 DIAGNOSIS — K219 Gastro-esophageal reflux disease without esophagitis: Secondary | ICD-10-CM | POA: Diagnosis not present

## 2018-01-31 DIAGNOSIS — K7460 Unspecified cirrhosis of liver: Secondary | ICD-10-CM | POA: Insufficient documentation

## 2018-01-31 DIAGNOSIS — D631 Anemia in chronic kidney disease: Secondary | ICD-10-CM | POA: Diagnosis not present

## 2018-01-31 HISTORY — PX: PORT-A-CATH REMOVAL: SHX5289

## 2018-01-31 SURGERY — MINOR REMOVAL PORT-A-CATH
Anesthesia: LOCAL

## 2018-01-31 MED ORDER — LIDOCAINE HCL (PF) 1 % IJ SOLN
INTRAMUSCULAR | Status: DC | PRN
  Administered 2018-01-31: 4 mL via INTRADERMAL

## 2018-01-31 MED ORDER — LIDOCAINE HCL (PF) 1 % IJ SOLN
INTRAMUSCULAR | Status: AC
  Filled 2018-01-31: qty 30

## 2018-01-31 SURGICAL SUPPLY — 19 items
ADH SKN CLS APL DERMABOND .7 (GAUZE/BANDAGES/DRESSINGS) ×1
CHLORAPREP W/TINT 10.5 ML (MISCELLANEOUS) ×2 IMPLANT
CLOTH BEACON ORANGE TIMEOUT ST (SAFETY) ×2 IMPLANT
DECANTER SPIKE VIAL GLASS SM (MISCELLANEOUS) ×2 IMPLANT
DERMABOND ADVANCED (GAUZE/BANDAGES/DRESSINGS) ×1
DERMABOND ADVANCED .7 DNX12 (GAUZE/BANDAGES/DRESSINGS) ×1 IMPLANT
DRAPE PROXIMA HALF (DRAPES) ×2 IMPLANT
GLOVE BIOGEL PI IND STRL 7.0 (GLOVE) ×1 IMPLANT
GLOVE BIOGEL PI INDICATOR 7.0 (GLOVE) ×1
GLOVE SURG SS PI 7.5 STRL IVOR (GLOVE) ×2 IMPLANT
GOWN STRL REUS W/TWL LRG LVL3 (GOWN DISPOSABLE) ×2 IMPLANT
NDL HYPO 25X1 1.5 SAFETY (NEEDLE) ×1 IMPLANT
NEEDLE HYPO 25X1 1.5 SAFETY (NEEDLE) ×2 IMPLANT
SPONGE GAUZE 2X2 8PLY STRL LF (GAUZE/BANDAGES/DRESSINGS) ×2 IMPLANT
SUT MNCRL AB 4-0 PS2 18 (SUTURE) ×2 IMPLANT
SUT VIC AB 3-0 SH 27 (SUTURE) ×2
SUT VIC AB 3-0 SH 27X BRD (SUTURE) ×1 IMPLANT
SYR CONTROL 10ML LL (SYRINGE) ×2 IMPLANT
TOWEL OR 17X26 4PK STRL BLUE (TOWEL DISPOSABLE) ×2 IMPLANT

## 2018-01-31 NOTE — Progress Notes (Addendum)
Patient was instructed to stop any blood thinners for 2 days.   Patient stopped digox for 2 days. His  daugher stated he was not taking a blood thinner now just Cardizem for his A FIB.   Dr Arnoldo Morale notified. Instructed daughter to start back today.

## 2018-01-31 NOTE — Interval H&P Note (Signed)
History and Physical Interval Note:  01/31/2018 9:19 AM  [name]  has presented today for surgery, with the diagnosis of large b cell lymphoma  The various methods of treatment have been discussed with the patient and family. After consideration of risks, benefits and other options for treatment, the patient has consented to  Procedure(s): MINOR REMOVAL PORT-A-CATH (N/A) as a surgical intervention .  The patient's history has been reviewed, patient examined, no change in status, stable for surgery.  I have reviewed the patient's chart and labs.  Questions were answered to the patient's satisfaction.     Aviva Signs

## 2018-01-31 NOTE — Discharge Instructions (Signed)
Implanted Port Removal, Care After °Refer to this sheet in the next few weeks. These instructions provide you with information about caring for yourself after your procedure. Your health care provider may also give you more specific instructions. Your treatment has been planned according to current medical practices, but problems sometimes occur. Call your health care provider if you have any problems or questions after your procedure. °What can I expect after the procedure? °After the procedure, it is common to have: °· Soreness or pain near your incision. °· Some swelling or bruising near your incision. ° °Follow these instructions at home: °Medicines °· Take over-the-counter and prescription medicines only as told by your health care provider. °· If you were prescribed an antibiotic medicine, take it as told by your health care provider. Do not stop taking the antibiotic even if you start to feel better. °Bathing °· Do not take baths, swim, or use a hot tub until your health care provider approves. Ask your health care provider if you can take showers. You may only be allowed to take sponge baths for bathing. °Incision care °· Follow instructions from your health care provider about how to take care of your incision. Make sure you: °? Wash your hands with soap and water before you change your bandage (dressing). If soap and water are not available, use hand sanitizer. °? Change your dressing as told by your health care provider. °? Keep your dressing dry. °? Leave stitches (sutures), skin glue, or adhesive strips in place. These skin closures may need to stay in place for 2 weeks or longer. If adhesive strip edges start to loosen and curl up, you may trim the loose edges. Do not remove adhesive strips completely unless your health care provider tells you to do that. °· Check your incision area every day for signs of infection. Check for: °? More redness, swelling, or pain. °? More fluid or  blood. °? Warmth. °? Pus or a bad smell. °Driving °· If you received a sedative, do not drive for 24 hours after the procedure. °· If you did not receive a sedative, ask your health care provider when it is safe to drive. °Activity °· Return to your normal activities as told by your health care provider. Ask your health care provider what activities are safe for you. °· Until your health care provider says it is safe: °? Do not lift anything that is heavier than 10 lb (4.5 kg). °? Do not do activities that involve lifting your arms over your head. °General instructions °· Do not use any tobacco products, such as cigarettes, chewing tobacco, and e-cigarettes. Tobacco can delay healing. If you need help quitting, ask your health care provider. °· Keep all follow-up visits as told by your health care provider. This is important. °Contact a health care provider if: °· You have more redness, swelling, or pain around your incision. °· You have more fluid or blood coming from your incision. °· Your incision feels warm to the touch. °· You have pus or a bad smell coming from your incision. °· You have a fever. °· You have pain that is not relieved by your pain medicine. °Get help right away if: °· You have chest pain. °· You have difficulty breathing. °This information is not intended to replace advice given to you by your health care provider. Make sure you discuss any questions you have with your health care provider. °Document Released: 11/23/2015 Document Revised: 05/19/2016 Document Reviewed: 09/16/2015 °Elsevier Interactive Patient   Education © 2018 Elsevier Inc. ° °

## 2018-01-31 NOTE — Op Note (Signed)
Patient:  [name]  DOB:  05-13-1931  MRN:  [id number]   Preop Diagnosis: Diffuse large B cell lymphoma  Postop Diagnosis: Same  Procedure: Port-A-Cath removal  Surgeon: Aviva Signs, MD  Anes: Local  Indications: Patient is an 84 year old white male who was referred to my care by oncology for Port-A-Cath removal as he is finished with chemotherapy for his lymphoma.  The risks and benefits of the procedure were fully explained to the patient, who gave informed consent.  Procedure note: The patient was placed in the supine position.  The left upper chest was prepped and draped using the usual sterile technique with DuraPrep.  Surgical site confirmation was performed.  1% Xylocaine was used for local anesthesia.  An incision was made through the previous surgical incision site.  This was taken down to the port.  The Port-A-Cath was removed in total without difficulty.  It was disposed of.  Subcutaneous layer was reapproximated using a 3-0 Vicryl interrupted suture.  The skin was closed using a 4-0 Monocryl subcuticular suture.  Dermabond was applied.  All tape and needle counts were correct at the end of the procedure.  Patient was discharged from the minor procedure room in good and stable condition.  Complications: None  EBL: Minimal  Specimen: None

## 2018-02-01 ENCOUNTER — Encounter (HOSPITAL_COMMUNITY): Payer: Self-pay | Admitting: General Surgery

## 2018-02-19 ENCOUNTER — Ambulatory Visit (HOSPITAL_COMMUNITY): Payer: Medicare Other

## 2018-03-19 ENCOUNTER — Ambulatory Visit (HOSPITAL_COMMUNITY): Payer: Medicare Other

## 2018-03-31 ENCOUNTER — Other Ambulatory Visit (HOSPITAL_COMMUNITY): Payer: Self-pay | Admitting: Adult Health

## 2018-03-31 DIAGNOSIS — E538 Deficiency of other specified B group vitamins: Secondary | ICD-10-CM

## 2018-04-15 ENCOUNTER — Encounter (HOSPITAL_COMMUNITY): Payer: Self-pay | Admitting: Adult Health

## 2018-04-15 ENCOUNTER — Other Ambulatory Visit (HOSPITAL_COMMUNITY): Payer: Self-pay | Admitting: Adult Health

## 2018-04-15 DIAGNOSIS — M47812 Spondylosis without myelopathy or radiculopathy, cervical region: Secondary | ICD-10-CM

## 2018-04-15 DIAGNOSIS — M059 Rheumatoid arthritis with rheumatoid factor, unspecified: Secondary | ICD-10-CM

## 2018-04-15 MED ORDER — OXYCODONE HCL 10 MG PO TABS: 10.0000 mg | ORAL_TABLET | Freq: Four times a day (QID) | ORAL | 0 refills | Status: DC | PRN

## 2018-04-15 NOTE — Progress Notes (Signed)
Patient called cancer center requesting refill of Oxycodone.   [place] Controlled Substance Reporting System reviewed and refill is appropriate on or after 04/15/18. Medication e-scribed to his pharmacy ([facility]) using [employer] 2-step verification process.    [place] reviewed:     Mike Craze, NP [place] [phone]

## 2018-04-19 ENCOUNTER — Ambulatory Visit (HOSPITAL_COMMUNITY): Payer: Medicare Other

## 2018-05-14 ENCOUNTER — Other Ambulatory Visit (HOSPITAL_COMMUNITY): Payer: Self-pay | Admitting: *Deleted

## 2018-05-14 DIAGNOSIS — C8330 Diffuse large B-cell lymphoma, unspecified site: Secondary | ICD-10-CM

## 2018-05-15 ENCOUNTER — Other Ambulatory Visit (HOSPITAL_COMMUNITY): Payer: Medicare Other

## 2018-05-17 ENCOUNTER — Other Ambulatory Visit (HOSPITAL_COMMUNITY): Payer: Medicare Other

## 2018-05-18 ENCOUNTER — Other Ambulatory Visit (HOSPITAL_COMMUNITY): Payer: Self-pay

## 2018-05-18 ENCOUNTER — Inpatient Hospital Stay (HOSPITAL_COMMUNITY): Payer: Medicare Other | Attending: Hematology

## 2018-05-18 DIAGNOSIS — Z8572 Personal history of non-Hodgkin lymphomas: Secondary | ICD-10-CM | POA: Insufficient documentation

## 2018-05-18 DIAGNOSIS — E538 Deficiency of other specified B group vitamins: Secondary | ICD-10-CM

## 2018-05-18 DIAGNOSIS — D696 Thrombocytopenia, unspecified: Secondary | ICD-10-CM

## 2018-05-18 DIAGNOSIS — C8338 Diffuse large B-cell lymphoma, lymph nodes of multiple sites: Secondary | ICD-10-CM

## 2018-05-18 LAB — COMPREHENSIVE METABOLIC PANEL
ALBUMIN: 3.5 g/dL (ref 3.5–5.0)
ALT: 22 U/L (ref 17–63)
AST: 24 U/L (ref 15–41)
Alkaline Phosphatase: 60 U/L (ref 38–126)
Anion gap: 8 (ref 5–15)
BUN: 20 mg/dL (ref 6–20)
CHLORIDE: 97 mmol/L — AB (ref 101–111)
CO2: 31 mmol/L (ref 22–32)
CREATININE: 1.47 mg/dL — AB (ref 0.61–1.24)
Calcium: 9 mg/dL (ref 8.9–10.3)
GFR calc non Af Amer: 42 mL/min — ABNORMAL LOW (ref >60)
GFR, EST AFRICAN AMERICAN: 49 mL/min — AB (ref >60)
Glucose, Bld: 175 mg/dL — ABNORMAL HIGH (ref 65–99)
Potassium: 4.8 mmol/L (ref 3.5–5.1)
SODIUM: 136 mmol/L (ref 135–145)
Total Bilirubin: 1.2 mg/dL (ref 0.3–1.2)
Total Protein: 7.6 g/dL (ref 6.5–8.1)

## 2018-05-18 LAB — CBC WITH DIFFERENTIAL/PLATELET
BASOS ABS: 0 10*3/uL (ref 0.0–0.1)
BASOS PCT: 0 %
EOS ABS: 0.1 10*3/uL (ref 0.0–0.7)
EOS PCT: 1 %
HCT: 45.9 % (ref 39.0–52.0)
HEMOGLOBIN: 14.9 g/dL (ref 13.0–17.0)
LYMPHS ABS: 0.6 10*3/uL — AB (ref 0.7–4.0)
Lymphocytes Relative: 7 %
MCH: 33.6 pg (ref 26.0–34.0)
MCHC: 32.5 g/dL (ref 30.0–36.0)
MCV: 103.4 fL — ABNORMAL HIGH (ref 78.0–100.0)
Monocytes Absolute: 0.5 10*3/uL (ref 0.1–1.0)
Monocytes Relative: 6 %
NEUTROS PCT: 86 %
Neutro Abs: 8 10*3/uL — ABNORMAL HIGH (ref 1.7–7.7)
Platelets: 135 10*3/uL — ABNORMAL LOW (ref 150–400)
RBC: 4.44 MIL/uL (ref 4.22–5.81)
RDW: 14.4 % (ref 11.5–15.5)
WBC: 9.3 10*3/uL (ref 4.0–10.5)

## 2018-05-18 LAB — VITAMIN B12: VITAMIN B 12: 764 pg/mL (ref 180–914)

## 2018-05-18 LAB — LACTATE DEHYDROGENASE: LDH: 192 U/L (ref 98–192)

## 2018-05-22 ENCOUNTER — Other Ambulatory Visit (HOSPITAL_COMMUNITY): Payer: Medicare Other

## 2018-05-22 ENCOUNTER — Ambulatory Visit (HOSPITAL_COMMUNITY): Payer: Medicare Other

## 2018-05-22 ENCOUNTER — Ambulatory Visit (HOSPITAL_COMMUNITY): Payer: Medicare Other | Admitting: Hematology

## 2018-05-31 ENCOUNTER — Telehealth (HOSPITAL_COMMUNITY): Payer: Self-pay

## 2018-05-31 DIAGNOSIS — M059 Rheumatoid arthritis with rheumatoid factor, unspecified: Secondary | ICD-10-CM

## 2018-05-31 DIAGNOSIS — M47812 Spondylosis without myelopathy or radiculopathy, cervical region: Secondary | ICD-10-CM

## 2018-05-31 MED ORDER — OXYCODONE HCL 10 MG PO TABS: 10.0000 mg | ORAL_TABLET | Freq: Four times a day (QID) | ORAL | 0 refills | Status: DC | PRN

## 2018-05-31 NOTE — Telephone Encounter (Signed)
Patients daughter called stating patient needs refill on pain medicine. Reviewed with Dr. Delton Coombes. He authorized one last refill. Further refills should come from PCP or pain clinic. Called daughter back to discuss the changes in our narcotic policy but had to leave a message. Requested she call me back at her earliest opportunity. Referral made to pain clinic.

## 2018-06-22 ENCOUNTER — Encounter (HOSPITAL_COMMUNITY): Payer: Self-pay | Admitting: Hematology

## 2018-06-22 ENCOUNTER — Inpatient Hospital Stay (HOSPITAL_COMMUNITY): Payer: Medicare Other | Attending: Hematology | Admitting: Hematology

## 2018-06-22 ENCOUNTER — Other Ambulatory Visit: Payer: Self-pay

## 2018-06-22 VITALS — BP 122/57 | HR 76 | Temp 97.7°F | Resp 18 | Wt 234.8 lb

## 2018-06-22 DIAGNOSIS — M069 Rheumatoid arthritis, unspecified: Secondary | ICD-10-CM | POA: Diagnosis not present

## 2018-06-22 DIAGNOSIS — C8330 Diffuse large B-cell lymphoma, unspecified site: Secondary | ICD-10-CM

## 2018-06-22 DIAGNOSIS — M25511 Pain in right shoulder: Secondary | ICD-10-CM | POA: Insufficient documentation

## 2018-06-22 DIAGNOSIS — Z8572 Personal history of non-Hodgkin lymphomas: Secondary | ICD-10-CM | POA: Insufficient documentation

## 2018-06-22 DIAGNOSIS — C8338 Diffuse large B-cell lymphoma, lymph nodes of multiple sites: Secondary | ICD-10-CM

## 2018-06-22 NOTE — Assessment & Plan Note (Addendum)
1.  Stage IV DLBCL with bone marrow involvement: - Received 1 cycle of R mini CHOP with poor tolerance.  He received palliative single agent rituximab.  Subsequent PET scan on 07/15/2016 shows complete metabolic response.  He has been followed since then. - Denies any B symptoms.  Mild fatigue has been stable.  Complains of right shoulder pain from his rheumatoid arthritis.  Had his port removed in February 2019. - No evidence of lymphadenopathy on physical exam today.  Labs are within normal limits.  We will monitor him in 16-months.  We will do CT scans only if clinical condition dictates.

## 2018-06-22 NOTE — Progress Notes (Signed)
[place], [place] [address]   CLINIC:  Medical Oncology/Hematology  PCP:  Dione Housekeeper, MD [address] [place] [address] [phone]   REASON FOR VISIT:  Follow-up for diffuse large B-cell lymphoma.  CURRENT THERAPY: Observation.  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 Worsening mediastinal adenopathy and bilateral hilar adenopathy as described with the largest node over the subcarinal region measuring 2.7 cm by short axis...      08/24/2015 Pathology Results    Diagnosis PLEURAL FLUID, RIGHT(SPECIMEN 1 OF 1 COLLECTED 08/24/15): ATYPICAL LYMPHOCYTES SUSPICIOUS FOR A LYMPHOPROLIFERATIVE PROCESS, SEE COMMENT. [name]      08/25/2015 Imaging    CT abd/pelvis- Mild upper retroperitoneal (gastrohepatic ligament and right retrocrural) lymphadenopathy. Cirrhosis.      09/03/2015 PET scan    Widespread metastatic adenopathy including the RIGHT cervical lymph nodes, LEFT axillary lymph nodes, mediastinal lymph, hilar lymph node, retroperitoneal lymph nodes and iliac lymph nodes and inguinal lymph nodes. Inguinal lymph nodes may be most....      09/11/2015 Pathology Results    Diagnosis Lymph node, biopsy, right inguinal - DIFFUSE LARGE B CELL LYMPHOMA.      09/11/2015 Pathology Results    Interpretation Tissue-Flow Cytometry - MONOCLONAL B-CELL POPULATION IDENTIFIED. Diagnosis Comment: The findings are consistent with non-Hodgkin B-cell lymphoma. (BNS:ecj 09/16/2015)      09/13/2015 - 09/18/2015 Hospital Admission    COPD exacerbation      09/18/2015 Bone Marrow Biopsy    Bone Marrow, Aspirate,Biopsy, and Clot, left iliac crest: Despite limited findings, the features are worrisome for  minimal involvement by B cell lymphoproliferative process, particularly given the previous history of large B cell lymphoma.       09/21/2015 - 10/11/2015 Chemotherapy    Mini-R-CHOP      09/29/2015 - 10/02/2015 Hospital Admission    SOB, anemia, dehydration, recurrent pleural effusion (right) Direct admit from [facility].  S/p 5 units of PRBCs, therapeutic thoracentesis on right relieveing 1.5 L of fluid, adjustment of pain medication.      10/12/2015 Treatment Plan Change    Patient intolerant to R-mini-CHOP.  Trantition to palliative treatment with single-agent Rituxan.      10/12/2015 - 11/02/2015 Chemotherapy    Rituxan single-agent      10/18/2015 - 10/21/2015 Hospital Admission    1.   Acute encephalopathy 2.   New onset seizure disorder      10/19/2015 Imaging    EEG- This recording is abnormal for the following reasons: Single epileptiform discharge involving the right anterior temporal area. This can correlates clinically with focal seizures.      11/28/2015 - 12/03/2015 Hospital Admission    Hospital Admission for BRBPR      07/15/2016 PET scan    Complete interval metabolic response to therapy. No metabolically active lymphoma identified. Persistent moderate right pleural effusion, right lung atelectasis, and emphysema.      07/28/2016 Procedure    A total of approximately 1.3 L of RIGHT pleural fluid was removed.      08/02/2016 Pathology Results    PLEURAL FLUID, RIGHT (SPECIMEN 1 OF 1 COLLECTED 07/28/16): - REACTIVE MESOTHELIAL CELLS PRESENT ADMIXED WITH LYMPHOCYTES. Tissue-Flow Cytometry - NO MONOCLONAL B CELL POPULATION  IDENTIFIED. - T CELLS WITH NONSPECIFIC CHANGES.        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 male returns  for follow-up of his large B-cell lymphoma.  He denies any fevers, night sweats or weight loss.  He is accompanied by his daughter.  Reportedly he has a hospital bed at home.  He lies in the bed or sits in the chair most part of the day.  He watches TV.  He goes back and forth to the bathroom.  Constipation and fatigue is stable.  Complains of pain in the right shoulder area for long duration.  He also has a history of rheumatoid arthritis.    REVIEW OF SYSTEMS:  Review of Systems  Constitutional: Positive for fatigue.  Gastrointestinal: Positive for constipation.  Neurological: Positive for dizziness and numbness.  All other systems reviewed and are negative.    PAST MEDICAL/SURGICAL HISTORY:  Past Medical History:  Diagnosis Date  . Anemia   . Aortic stenosis, moderate 05/21/2015  . Asthma   . B12 deficiency 07/28/2016  . Cellulitis of leg, left 01/03/2015  . Cervical spondylosis without myelopathy   . Cholelithiasis   . Chronic atrial fibrillation ([place])   . Chronic diastolic heart failure ([place])   . Chronic lower back pain   . Cirrhosis ([place])    Questionable, AFP normal Feb 01/2012, hx of ETOH use   . COPD (chronic obstructive pulmonary disease) (HCC)    Uses occasional nighttime O2  . Diffuse large B cell lymphoma ([place]) 08/22/2015  . Disseminated herpes zoster 2010  . DNR (do not resuscitate) 11/01/2015  . Dysrhythmia    chronic AFib  . Essential hypertension, benign   . GERD (gastroesophageal reflux disease)   . Headache(784.0)   . History of chicken pox 1941; 2011  . Iron deficiency anemia 01/18/2012  . Lupus anticoagulant positive   . Mixed hyperlipidemia   . Pulmonary embolus ([place])    2003 and 2011  . Pulmonary nodules    Chest CT 09/11  . Rectal ulcer 12/01/2015  . Rheumatoid arthritis(714.0)   . Seizure disorder ([place]) 09/2015  . Stage III chronic kidney disease ([place])   . Telangiectasia 11/29/2015   ANTRUM AND JEJUNUM   Past Surgical History:  Procedure Laterality  Date  . BACK SURGERY    . CATARACT EXTRACTION W/ INTRAOCULAR LENS  IMPLANT, BILATERAL    . CHOLECYSTECTOMY  05/23/2012   Procedure: LAPAROSCOPIC CHOLECYSTECTOMY WITH INTRAOPERATIVE CHOLANGIOGRAM;  Surgeon: Stark Klein, MD;  Location: [facility];  Service: General;  Laterality: N/A;  . COLONOSCOPY  01/24/2013   JOA:CZYSAYT polyps/colonic diverticulosis  . COLONOSCOPY N/A 12/01/2015   Procedure: COLONOSCOPY;  Surgeon: Danie Binder, MD;  [facility];  Service: Endoscopy;  Laterality: N/A;  . ESOPHAGOGASTRODUODENOSCOPY  02/02/12   KZS:WFUXNATF size hiatal hernia; otherwise normal exam  . ESOPHAGOGASTRODUODENOSCOPY N/A 11/29/2015   TDD:UKGURKYH diverticulosis/small internal hemorrhoids/left colon is redundant  . LYMPH NODE BIOPSY    . LYMPH NODE BIOPSY Right 09/11/2015   Procedure: RIGHT INGUINAL LYMPH NODE BIOPSY;  Surgeon: Aviva Signs, MD;  Location: [facility];  Service: General;  Laterality: Right;  . MYRINGOTOMY     "3 times; both ears"  . POLYPECTOMY  02/02/2012   RMR: Multiple colonic polyps removed, flat, tubular adenomas/  Left-sided diverticulosis  . PORT-A-CATH REMOVAL N/A 01/31/2018   Procedure: MINOR REMOVAL PORT-A-CATH;  Surgeon: Aviva Signs, MD;  Location: [facility];  Service: General;  Laterality: N/A;  . PORTACATH PLACEMENT    . PORTACATH PLACEMENT Left 09/11/2015   Procedure: INSERTION PORT-A-CATH;  Surgeon: Aviva Signs, MD;  Location: [facility];  Service: General;  Laterality: Left;  . POSTERIOR LUMBAR VERTEBRAE EXCISION  2003; 2009; 2011     SOCIAL HISTORY:  Social History   Socioeconomic History  . Marital status: Widowed    Spouse name: Not on file  . Number of children: 6  . Years of education: 32th  . Highest education level: Not on file  Occupational History  . Occupation: retired    Fish farm manager: RETIRED  . Occupation: RETIRED    Employer: RETIRED  Social Needs  . Financial resource strain: Not on file  . Food insecurity:    Worry: Not on file    Inability: Not  on file  . Transportation needs:    Medical: Not on file    Non-medical: Not on file  Tobacco Use  . Smoking status: Former Smoker    Packs/day: 1.00    Years: 57.00    Pack years: 57.00    Types: Cigarettes    Last attempt to quit: 01/27/1998    Years since quitting: 20.4  . Smokeless tobacco: Never Used  Substance and Sexual Activity  . Alcohol use: No    Alcohol/week: 0.0 oz    Comment: "quit drinking 1976"  . Drug use: No  . Sexual activity: Not Currently  Lifestyle  . Physical activity:    Days per week: Not on file    Minutes per session: Not on file  . Stress: Not on file  Relationships  . Social connections:    Talks on phone: Not on file    Gets together: Not on file    Attends religious service: Not on file    Active member of club or organization: Not on file    Attends meetings of clubs or organizations: Not on file    Relationship status: Not on file  . Intimate partner violence:    Fear of current or ex partner: Not on file    Emotionally abused: Not on file    Physically abused: Not on file    Forced sexual activity: Not on file  Other Topics Concern  . Not on file  Social History Narrative  . Not on file    FAMILY HISTORY:  Family History  Problem Relation Age of Onset  . Diabetes Mother   . Colon cancer Neg Hx   . Liver disease Neg Hx     CURRENT MEDICATIONS:  Outpatient Encounter Medications as of 06/22/2018  Medication Sig Note  . albuterol (PROAIR HFA) 108 (90 BASE) MCG/ACT inhaler Inhale 2 puffs into the lungs every 6 (six) hours as needed. Shortness of Breath   . allopurinol (ZYLOPRIM) 300 MG tablet Take 300 mg by mouth daily.   . cetirizine (ZYRTEC) 10 MG tablet Take 10 mg by mouth daily.   Marland Kitchen DIGOX 125 MCG tablet TAKE ONE TABLET BY MOUTH DAILY 01/29/2018: On hold due to upcoming procedure.  . diltiazem (CARDIZEM CD) 240 MG 24 hr capsule Take 1 capsule (240 mg total) by mouth daily.   Marland Kitchen diltiazem (TIAZAC) 240 MG 24 hr capsule Take 240 mg by  mouth daily.   . ferrous sulfate 325 (65 FE) MG tablet Take 325 mg by mouth daily.   Marland Kitchen  fluticasone (FLONASE) 50 MCG/ACT nasal spray Place 2 sprays into both nostrils daily as needed (for allergies/congestion.).    Marland Kitchen folic acid (FOLVITE) 1 MG tablet TAKE ONE TABLET BY MOUTH DAILY.   . furosemide (LASIX) 40 MG tablet Take 1 tablet (40 mg total) by mouth 2 (two) times daily. Take daily for prevention of fluid buildup. (Patient taking differently: Take 40 mg by mouth daily. Take scheduled in the morning each day, may repeat dose later in the evening if needed fluid buildup)   . gabapentin (NEURONTIN) 300 MG capsule Take 1 capsule (300 mg total) by mouth 2 (two) times daily.   Marland Kitchen guaiFENesin (MUCINEX) 600 MG 12 hr tablet Take 600 mg by mouth daily.   Marland Kitchen ipratropium-albuterol (DUONEB) 0.5-2.5 (3) MG/3ML SOLN Take 3 mLs by nebulization every 6 (six) hours as needed. (Patient taking differently: Take 3 mLs by nebulization every 6 (six) hours as needed (shortness of breath). ) 01/29/2018: Typically uses this one  . ketoconazole (NIZORAL) 2 % shampoo Apply 1 application topically as needed for irritation. For scalp irritation   . levalbuterol (XOPENEX) 0.63 MG/3ML nebulizer solution Take 3 mLs (0.63 mg total) by nebulization every 6 (six) hours as needed for wheezing or shortness of breath. 01/29/2018: Seldom uses  . levETIRAcetam (KEPPRA) 500 MG tablet Take 1 tablet (500 mg total) by mouth 2 (two) times daily.   . methocarbamol (ROBAXIN) 500 MG tablet Take 1 tablet (500 mg total) by mouth every 8 (eight) hours as needed for muscle spasms. (Patient taking differently: Take 250-500 mg by mouth every 8 (eight) hours as needed for muscle spasms ((typically takes 250 mg twice daily)). )   . Multiple Vitamin (MULTIVITAMIN WITH MINERALS) TABS tablet Take 1 tablet by mouth at bedtime.   . Omega-3 Fatty Acids (FISH OIL) 1000 MG CAPS Take 1,000 mg by mouth 2 (two) times daily.    Marland Kitchen omeprazole (PRILOSEC) 40 MG capsule Take 1  capsule (40 mg total) by mouth 2 (two) times daily.   . Oxycodone HCl 10 MG TABS Take 1 tablet (10 mg total) by mouth every 6 (six) hours as needed.   . potassium chloride SA (K-DUR,KLOR-CON) 20 MEQ tablet Take 1 tablet (20 mEq total) by mouth daily. Starting 08/27/15. (Patient taking differently: Take 10 mEq by mouth 2 (two) times daily. )   . predniSONE (DELTASONE) 20 MG tablet Take 10-20 mg by mouth 2 (two) times daily. Take 1 tablet (20 mg)  in the morning & 0.5 tablet (10 mg) in the evening.   Marland Kitchen QUEtiapine (SEROQUEL) 25 MG tablet Take 1 tablet (25 mg total) by mouth 2 (two) times daily.   . tadalafil (CIALIS) 5 MG tablet Take 5 mg by mouth daily as needed for erectile dysfunction.    . tamsulosin (FLOMAX) 0.4 MG CAPS capsule Take 1 capsule (0.4 mg total) by mouth at bedtime. For prostate treatment.   . Vitamin D, Ergocalciferol, (DRISDOL) 50000 UNITS CAPS capsule Take 1 capsule by mouth every Saturday. In the morning.    Facility-Administered Encounter Medications as of 06/22/2018  Medication  . heparin lock flush 100 unit/mL  . sodium chloride flush (NS) 0.9 % injection 10 mL    ALLERGIES:  Allergies  Allergen Reactions  . Cymbalta [Duloxetine Hcl] Other (See Comments)    Confusion   . Procaine Hcl Hives and Other (See Comments)    NOVOCAINE: Sweating, Confusion, Not in right state of mind.  [name]    unknown  unknown     PHYSICAL EXAM:  ECOG Performance status: 2  Vitals:   06/22/18 1441  BP: (!) 122/57  Pulse: 76  Resp: 18  Temp: 97.7 F (36.5 C)  SpO2: 94%   Filed Weights   06/22/18 1441  Weight: 234 lb 12.8 oz (106.5 kg)    Physical Exam  HEENT: No thrush or mucosal lesions. Chest: Bilateral clear to auscultation. CVS: S1-S2 regular rate and rhythm. Abdomen: Soft nontender with no hepatospleno megaly. Extremities: Trace edema bilaterally. Lymphadenopathy: No palpable neck, supraclavicular, axillary or inguinal  adenopathy. LABORATORY DATA:  I have reviewed the labs as listed.  CBC    Component Value Date/Time   WBC 9.3 05/18/2018 1438   RBC 4.44 05/18/2018 1438   HGB 14.9 05/18/2018 1438   HCT 45.9 05/18/2018 1438   HCT 34 01/11/2012 1007   PLT 135 (L) 05/18/2018 1438   MCV 103.4 (H) 05/18/2018 1438   MCV 92.0 01/11/2012 1007   MCH 33.6 05/18/2018 1438   MCHC 32.5 05/18/2018 1438   RDW 14.4 05/18/2018 1438   LYMPHSABS 0.6 (L) 05/18/2018 1438   MONOABS 0.5 05/18/2018 1438   EOSABS 0.1 05/18/2018 1438   BASOSABS 0.0 05/18/2018 1438   CMP Latest Ref Rng & Units 05/18/2018 11/21/2017 07/13/2017  Glucose 65 - 99 mg/dL 175(H) 137(H) 97  BUN 6 - 20 mg/dL '20 20 14  ' Creatinine 0.61 - 1.24 mg/dL 1.47(H) 1.25(H) 1.21  Sodium 135 - 145 mmol/L 136 140 140  Potassium 3.5 - 5.1 mmol/L 4.8 3.8 3.7  Chloride 101 - 111 mmol/L 97(L) 102 102  CO2 22 - 32 mmol/L '31 30 30  ' Calcium 8.9 - 10.3 mg/dL 9.0 9.1 9.2  Total Protein 6.5 - 8.1 g/dL 7.6 7.7 7.5  Total Bilirubin 0.3 - 1.2 mg/dL 1.2 0.8 0.7  Alkaline Phos 38 - 126 U/L 60 62 62  AST 15 - 41 U/L '24 22 21  ' ALT 17 - 63 U/L 22 20 13(L)          ASSESSMENT & PLAN:   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  Stage IV DLBCL with bone marrow involvement: - Received 1 cycle of R mini CHOP with poor tolerance.  He received palliative single agent rituximab.  Subsequent PET scan on 07/15/2016 shows complete metabolic response.  He has been followed since then. - Denies any B symptoms.  Mild fatigue has been stable.  Complains of right shoulder pain from his rheumatoid arthritis.  Had his port removed in February 2019. - No evidence of lymphadenopathy on physical exam today.  Labs are within normal limits.  We will monitor him in 13-month.  We will do CT scans only if clinical condition dictates.      Orders placed this encounter:  Orders Placed This Encounter  Procedures  . Lactate dehydrogenase, isoenzymes  . CBC with  Differential/Platelet  . Comprehensive metabolic panel      SDerek Jack MD [phone]

## 2018-07-03 ENCOUNTER — Other Ambulatory Visit (HOSPITAL_COMMUNITY): Payer: Self-pay | Admitting: *Deleted

## 2018-07-03 DIAGNOSIS — E538 Deficiency of other specified B group vitamins: Secondary | ICD-10-CM

## 2018-07-03 MED ORDER — FOLIC ACID 1 MG PO TABS: 1.0000 mg | ORAL_TABLET | Freq: Every day | ORAL | 4 refills | Status: AC

## 2018-08-07 ENCOUNTER — Other Ambulatory Visit (HOSPITAL_COMMUNITY): Payer: Self-pay | Admitting: *Deleted

## 2018-08-07 DIAGNOSIS — M059 Rheumatoid arthritis with rheumatoid factor, unspecified: Secondary | ICD-10-CM

## 2018-08-07 DIAGNOSIS — M47812 Spondylosis without myelopathy or radiculopathy, cervical region: Secondary | ICD-10-CM

## 2018-08-07 MED ORDER — OXYCODONE HCL 10 MG PO TABS: 10.0000 mg | ORAL_TABLET | Freq: Four times a day (QID) | ORAL | 0 refills | Status: DC | PRN

## 2018-08-08 ENCOUNTER — Other Ambulatory Visit (HOSPITAL_COMMUNITY): Payer: Self-pay | Admitting: *Deleted

## 2018-08-08 DIAGNOSIS — M47812 Spondylosis without myelopathy or radiculopathy, cervical region: Secondary | ICD-10-CM

## 2018-08-08 DIAGNOSIS — M059 Rheumatoid arthritis with rheumatoid factor, unspecified: Secondary | ICD-10-CM

## 2018-08-08 MED ORDER — OXYCODONE HCL 10 MG PO TABS: 10.0000 mg | ORAL_TABLET | Freq: Four times a day (QID) | ORAL | 0 refills | Status: DC | PRN

## 2018-10-11 ENCOUNTER — Other Ambulatory Visit (HOSPITAL_COMMUNITY): Payer: Self-pay | Admitting: *Deleted

## 2018-10-11 DIAGNOSIS — M059 Rheumatoid arthritis with rheumatoid factor, unspecified: Secondary | ICD-10-CM

## 2018-10-11 DIAGNOSIS — M47812 Spondylosis without myelopathy or radiculopathy, cervical region: Secondary | ICD-10-CM

## 2018-10-12 MED ORDER — OXYCODONE HCL 10 MG PO TABS: 10.0000 mg | ORAL_TABLET | Freq: Four times a day (QID) | ORAL | 0 refills | Status: DC | PRN

## 2018-11-20 ENCOUNTER — Other Ambulatory Visit (HOSPITAL_COMMUNITY): Payer: Self-pay | Admitting: Hematology

## 2018-11-20 DIAGNOSIS — E538 Deficiency of other specified B group vitamins: Secondary | ICD-10-CM

## 2018-12-24 ENCOUNTER — Other Ambulatory Visit (HOSPITAL_COMMUNITY): Payer: Self-pay | Admitting: *Deleted

## 2018-12-24 DIAGNOSIS — M059 Rheumatoid arthritis with rheumatoid factor, unspecified: Secondary | ICD-10-CM

## 2018-12-24 DIAGNOSIS — M47812 Spondylosis without myelopathy or radiculopathy, cervical region: Secondary | ICD-10-CM

## 2018-12-24 MED ORDER — OXYCODONE HCL 10 MG PO TABS: 10.0000 mg | ORAL_TABLET | Freq: Four times a day (QID) | ORAL | 0 refills | Status: AC | PRN

## 2018-12-28 ENCOUNTER — Other Ambulatory Visit (HOSPITAL_COMMUNITY): Payer: Medicare Other

## 2019-01-04 ENCOUNTER — Ambulatory Visit (HOSPITAL_COMMUNITY): Payer: Medicare Other | Admitting: Hematology

## 2019-02-28 ENCOUNTER — Telehealth (HOSPITAL_COMMUNITY): Payer: Self-pay | Admitting: *Deleted

## 2019-04-04 ENCOUNTER — Other Ambulatory Visit (HOSPITAL_COMMUNITY): Payer: Medicare Other

## 2019-04-12 ENCOUNTER — Ambulatory Visit (HOSPITAL_COMMUNITY): Payer: Medicare Other | Admitting: Hematology

## 2019-05-27 DEATH — deceased
